# Patient Record
Sex: Female | Born: 1945 | Race: White | Hispanic: No | Marital: Married | Smoking: Never smoker
Health system: Southern US, Community
[De-identification: ages and names within clinical notes are randomized; demographics above are authoritative.]

## PROBLEM LIST (undated history)

## (undated) DIAGNOSIS — M1990 Unspecified osteoarthritis, unspecified site: Secondary | ICD-10-CM

## (undated) DIAGNOSIS — M419 Scoliosis, unspecified: Secondary | ICD-10-CM

## (undated) DIAGNOSIS — Z923 Personal history of irradiation: Secondary | ICD-10-CM

## (undated) DIAGNOSIS — J449 Chronic obstructive pulmonary disease, unspecified: Secondary | ICD-10-CM

## (undated) DIAGNOSIS — I10 Essential (primary) hypertension: Secondary | ICD-10-CM

## (undated) DIAGNOSIS — R0600 Dyspnea, unspecified: Secondary | ICD-10-CM

## (undated) DIAGNOSIS — J40 Bronchitis, not specified as acute or chronic: Secondary | ICD-10-CM

## (undated) DIAGNOSIS — K2970 Gastritis, unspecified, without bleeding: Secondary | ICD-10-CM

## (undated) DIAGNOSIS — F32A Depression, unspecified: Secondary | ICD-10-CM

## (undated) DIAGNOSIS — F329 Major depressive disorder, single episode, unspecified: Secondary | ICD-10-CM

## (undated) DIAGNOSIS — R112 Nausea with vomiting, unspecified: Secondary | ICD-10-CM

## (undated) DIAGNOSIS — IMO0002 Reserved for concepts with insufficient information to code with codable children: Secondary | ICD-10-CM

## (undated) DIAGNOSIS — F419 Anxiety disorder, unspecified: Secondary | ICD-10-CM

## (undated) DIAGNOSIS — J45909 Unspecified asthma, uncomplicated: Secondary | ICD-10-CM

## (undated) DIAGNOSIS — K219 Gastro-esophageal reflux disease without esophagitis: Secondary | ICD-10-CM

## (undated) DIAGNOSIS — Z9889 Other specified postprocedural states: Secondary | ICD-10-CM

## (undated) HISTORY — DX: Gastro-esophageal reflux disease without esophagitis: K21.9

## (undated) HISTORY — DX: Scoliosis, unspecified: M41.9

## (undated) HISTORY — DX: Chronic obstructive pulmonary disease, unspecified: J44.9

## (undated) HISTORY — DX: Unspecified osteoarthritis, unspecified site: M19.90

## (undated) HISTORY — DX: Bronchitis, not specified as acute or chronic: J40

## (undated) HISTORY — DX: Major depressive disorder, single episode, unspecified: F32.9

## (undated) HISTORY — DX: Depression, unspecified: F32.A

## (undated) HISTORY — DX: Gastritis, unspecified, without bleeding: K29.70

## (undated) HISTORY — DX: Unspecified asthma, uncomplicated: J45.909

## (undated) HISTORY — DX: Essential (primary) hypertension: I10

## (undated) HISTORY — PX: LAPAROSCOPIC REMOVAL OF MESENTERIC MASS: SHX5917

## (undated) HISTORY — DX: Reserved for concepts with insufficient information to code with codable children: IMO0002

## (undated) HISTORY — PX: ABDOMINAL HYSTERECTOMY: SHX81

---

## 1969-01-19 HISTORY — PX: DILATION AND CURETTAGE OF UTERUS: SHX78

## 1976-01-20 HISTORY — PX: TUBAL LIGATION: SHX77

## 1979-01-20 HISTORY — PX: PARTIAL HYSTERECTOMY: SHX80

## 1994-01-19 HISTORY — PX: CHOLECYSTECTOMY: SHX55

## 1996-01-20 HISTORY — PX: LUMBAR DISC SURGERY: SHX700

## 2004-02-05 ENCOUNTER — Ambulatory Visit: Payer: Self-pay | Admitting: Internal Medicine

## 2005-05-21 ENCOUNTER — Ambulatory Visit: Payer: Self-pay | Admitting: Internal Medicine

## 2005-05-27 ENCOUNTER — Ambulatory Visit: Payer: Self-pay | Admitting: Internal Medicine

## 2005-07-20 ENCOUNTER — Ambulatory Visit: Payer: Self-pay | Admitting: Unknown Physician Specialty

## 2005-08-17 ENCOUNTER — Ambulatory Visit: Payer: Self-pay | Admitting: Cardiology

## 2005-10-02 ENCOUNTER — Ambulatory Visit: Payer: Self-pay | Admitting: Unknown Physician Specialty

## 2006-07-14 ENCOUNTER — Ambulatory Visit: Payer: Self-pay | Admitting: Internal Medicine

## 2007-05-15 ENCOUNTER — Ambulatory Visit: Payer: Self-pay | Admitting: Family Medicine

## 2007-06-01 ENCOUNTER — Ambulatory Visit: Payer: Self-pay | Admitting: Internal Medicine

## 2007-07-27 ENCOUNTER — Ambulatory Visit: Payer: Self-pay | Admitting: Internal Medicine

## 2007-08-17 ENCOUNTER — Ambulatory Visit: Payer: Self-pay | Admitting: Unknown Physician Specialty

## 2008-07-31 ENCOUNTER — Ambulatory Visit: Payer: Self-pay | Admitting: Internal Medicine

## 2009-08-05 ENCOUNTER — Ambulatory Visit: Payer: Self-pay | Admitting: Internal Medicine

## 2010-02-10 ENCOUNTER — Ambulatory Visit: Payer: Self-pay | Admitting: Unknown Physician Specialty

## 2010-02-12 LAB — PATHOLOGY REPORT

## 2011-01-28 DIAGNOSIS — M4310 Spondylolisthesis, site unspecified: Secondary | ICD-10-CM | POA: Insufficient documentation

## 2011-01-28 DIAGNOSIS — M51379 Other intervertebral disc degeneration, lumbosacral region without mention of lumbar back pain or lower extremity pain: Secondary | ICD-10-CM | POA: Insufficient documentation

## 2011-01-28 DIAGNOSIS — M4120 Other idiopathic scoliosis, site unspecified: Secondary | ICD-10-CM | POA: Insufficient documentation

## 2011-02-04 ENCOUNTER — Ambulatory Visit: Payer: Self-pay | Admitting: Internal Medicine

## 2011-03-22 ENCOUNTER — Emergency Department: Payer: Self-pay | Admitting: Emergency Medicine

## 2011-05-11 DIAGNOSIS — J984 Other disorders of lung: Secondary | ICD-10-CM | POA: Insufficient documentation

## 2011-10-28 ENCOUNTER — Other Ambulatory Visit: Payer: Self-pay | Admitting: Internal Medicine

## 2011-10-28 MED ORDER — CLONAZEPAM 0.5 MG PO TABS: ORAL_TABLET | ORAL | Status: DC

## 2011-10-28 NOTE — Telephone Encounter (Signed)
Refill request for clonazepam 0.5 mg patient takes 1 1/2 to 1 twice a day she uses [facility] in [place].

## 2011-10-28 NOTE — Telephone Encounter (Signed)
Ok x 3  

## 2011-10-28 NOTE — Telephone Encounter (Signed)
Rx called to [facility]. 

## 2011-12-09 ENCOUNTER — Telehealth: Payer: Self-pay | Admitting: Internal Medicine

## 2011-12-09 NOTE — Telephone Encounter (Signed)
I am unable to see her today (since not here this pm).  I can see her tomorrow at 12:00 if she thinks she can wait until then.  If unable to wait,then acute care for eval.  Thanks.

## 2011-12-09 NOTE — Telephone Encounter (Signed)
Pt called pt wanted to know if she could see you today.  She thinks she has thrush in mouth and she is congested,left side of neck swollen. Headache

## 2011-12-09 NOTE — Telephone Encounter (Signed)
Pt aware of appointment 11/21 @ 12

## 2011-12-10 ENCOUNTER — Encounter: Payer: Self-pay | Admitting: Internal Medicine

## 2011-12-10 ENCOUNTER — Ambulatory Visit (INDEPENDENT_AMBULATORY_CARE_PROVIDER_SITE_OTHER): Payer: BC Managed Care – PPO | Admitting: Internal Medicine

## 2011-12-10 VITALS — BP 116/70 | HR 86 | Temp 98.2°F | Ht 61.0 in | Wt 173.5 lb

## 2011-12-10 DIAGNOSIS — J45909 Unspecified asthma, uncomplicated: Secondary | ICD-10-CM

## 2011-12-10 MED ORDER — FIRST-DUKES MOUTHWASH MT SUSP: 10.0000 mL | Freq: Three times a day (TID) | OROMUCOSAL | Status: DC

## 2011-12-10 MED ORDER — FLUTICASONE PROPIONATE 50 MCG/ACT NA SUSP: 2.0000 | Freq: Every day | NASAL | Status: DC

## 2011-12-10 MED ORDER — CEFDINIR 300 MG PO CAPS: 300.0000 mg | ORAL_CAPSULE | Freq: Two times a day (BID) | ORAL | Status: DC

## 2011-12-10 NOTE — Patient Instructions (Addendum)
It was nice seeing you today.  I am sorry you have not been feeling well.  I want you to take an antibiotic (omnicef) - twice a day.  Also saline nasal flushes - 2-3x/day.  Flonase nasal spray - in the evening.  Mucinex in the am and robitussin in the evening.  Let me know if any problems.

## 2011-12-13 ENCOUNTER — Encounter: Payer: Self-pay | Admitting: Internal Medicine

## 2011-12-13 DIAGNOSIS — J45909 Unspecified asthma, uncomplicated: Secondary | ICD-10-CM | POA: Insufficient documentation

## 2011-12-13 NOTE — Assessment & Plan Note (Signed)
No increased sob or wheezing.  Continue her current med regimen.  Treat the infection.  Follow.

## 2011-12-13 NOTE — Progress Notes (Signed)
  Subjective:    [name], female    DOB: 1945-04-04, 66 y.o.   MRN: [id number]  HPI 66 year old female with past history of anxiety/depression and late onset asthma/COPD who comes in today with concerns regarding sinus congestion.  She reports increased sinus pressure.  Describes a headache which she feels is similar to her sinus headaches.  Left neck is a little sore.  She has also had some trouble with her tongue burning.  Was swollen on the left side, but is better now.  Had white patches over her tongue.  Better now.  No throat swelling or other tongue swelling.  No swallowing issues.  No nausea or vomiting.  Eating and drinking ok.  No sob.  No chest tightness.  She did have a nasal fracture 6/13.  Went to acute care.  Did not see ENT.   Past Medical History  Diagnosis Date  . Scoliosis   . Depression   . Hypertension   . Asthma   . COPD (chronic obstructive pulmonary disease)   . Ulcer   . Bronchitis   . Gastritis     Review of Systems Patient denies any lightheadedness or dizziness.  Does report a "sinus headache".  Increased sinus pressure and symptoms as outlined. No chest pain, tightness or palpitations.  No increased shortness of breath, cough or congestion. No increased wheezing.   No nausea or vomiting.  No abdominal pain or cramping.  No bowel change, such as diarrhea, constipation, BRBPR or melana.  No urine change.        Objective:   Physical Exam Filed Vitals:   12/10/11 1212  BP: 116/70  Pulse: 86  Temp: 98.2 F (50.19 C)   66 year old female in no acute distress.   HEENT:  Nares - clear.  OP- with minimal erythema left lateral tongue.  No white patches.  No swelling.  Posterior oropharynx - without lesions or erythema.   NECK:  Supple.  Minimal tenderness to palpation over the left lateral neck.     HEART:  Appears to be regular. LUNGS:  Without crackles or wheezing audible.  Respirations even and unlabored.                     Assessment & Plan:   PROBABLE SINUSITIS.  Treat with Omnicef 300mg  bid x 10 days.  Saline flushes and flonase as directed.  Robitussin as directed.  Follow.    POSSIBLE THRUSH.  Will give her Dukes magic mouthwash to swish and spit tid.  Notify me or be reevaluated if persistent problems.

## 2012-01-18 ENCOUNTER — Other Ambulatory Visit: Payer: Self-pay | Admitting: Internal Medicine

## 2012-01-18 ENCOUNTER — Other Ambulatory Visit: Payer: Self-pay | Admitting: *Deleted

## 2012-01-18 MED ORDER — TIOTROPIUM BROMIDE MONOHYDRATE 18 MCG IN CAPS: 18.0000 ug | ORAL_CAPSULE | Freq: Every day | RESPIRATORY_TRACT | Status: DC

## 2012-01-18 NOTE — Telephone Encounter (Signed)
Sent in to pharmacy.  

## 2012-01-18 NOTE — Telephone Encounter (Signed)
Spiriva Handihaler    Inhale contents one capsule daily  30 cap

## 2012-01-29 ENCOUNTER — Other Ambulatory Visit: Payer: Self-pay | Admitting: Internal Medicine

## 2012-01-29 MED ORDER — HYDROCHLOROTHIAZIDE 25 MG PO TABS: 25.0000 mg | ORAL_TABLET | Freq: Every day | ORAL | Status: DC

## 2012-01-29 NOTE — Telephone Encounter (Signed)
[facility]  hydrochlorothiazide (HYDRODIURIL) 25 MG tablet # 30

## 2012-01-29 NOTE — Telephone Encounter (Signed)
Sent in to pharmacy.  

## 2012-02-15 ENCOUNTER — Ambulatory Visit: Payer: Self-pay | Admitting: Internal Medicine

## 2012-03-01 ENCOUNTER — Other Ambulatory Visit: Payer: Self-pay | Admitting: *Deleted

## 2012-03-01 MED ORDER — CITALOPRAM HYDROBROMIDE 40 MG PO TABS: 40.0000 mg | ORAL_TABLET | Freq: Every day | ORAL | Status: DC

## 2012-03-07 ENCOUNTER — Telehealth: Payer: Self-pay | Admitting: Internal Medicine

## 2012-03-07 ENCOUNTER — Ambulatory Visit: Payer: BC Managed Care – PPO | Admitting: Internal Medicine

## 2012-03-07 NOTE — Telephone Encounter (Signed)
Pt says she is just not feeling well. She has no energy and is trying to eat but she just does not feel good. She has not been to the restroom since last Sunday night and is not sure what to do. I offered her the 10:15 am to day but she lives so far away she didn't think she could make that and your next appt isn't until this Thursday at 8 am. I offered for her to see Raquel but she insists on seeing just you.

## 2012-03-07 NOTE — Telephone Encounter (Signed)
Patient spoke to Strang.

## 2012-03-07 NOTE — Telephone Encounter (Signed)
If no bowel movement since 02/29/12 - needs eval.  I would rec acute care.  Can xray if needed.  Make sure not obstruction.

## 2012-03-07 NOTE — Telephone Encounter (Signed)
Patient Information:  Caller Name: Baileigh  Phone: [phone]  Patient: Jennifer [name]  Gender: Female  DOB: 12/25/1945  Age: 67 Years  PCP: Dale [place]  Office Follow Up:  Does the office need to follow up with this patient?: Yes  Instructions For The Office: Please call back regarding work in appointment for today 03/07/12.  RN Note:  Last BM 02/29/12.  Mild, intermittent supraumbilical abdominal discomfort, mainly after eating. Denies nausea. Moderate fatigue follows 24 hours of moderate-severe vomiting and diarrhea; needs to rest after going to store. Drinking Ginger-Ale, Gatorade, Coffee.  Voiding per normal. Still feels fatigued 1 week after symptoms ended.  No appointments remain in office for 03/07/12.   Symptoms  Reason For Call & Symptoms: Fatigue, "feels worn out,"  abdominal bloating and intermittent stomach pain. Symptoms began episodes ofvomiting and diarrhea  Reviewed Health History In EMR: Yes  Reviewed Medications In EMR: Yes  Reviewed Allergies In EMR: Yes  Reviewed Surgeries / Procedures: Yes  Date of Onset of Symptoms: 02/28/2012  Treatments Tried: probiotic in orange juice  Treatments Tried Worked: No  Guideline(s) Used:  Weakness (Generalized) and Fatigue  Disposition Per Guideline:   Go to Office Now  Reason For Disposition Reached:   Moderate weakness (i.e., interferes with work, school, normal activities) and cause unknown  Advice Given:  Reassurance  Weakness often accompanies viral illnesses (e.g., colds and flu).  The weakness is usually worse the first 3 days of the illness, then gets better.  Here is some care advice that should help.  Reassurance  Not drinking enough fluids and being a little dehydrated is a common cause of mild weakness.  Fluids  : Drink several glasses of fruit juice, other clear fluids, or water. This will improve hydration and blood glucose.  Call Back If:   Passes out (faints)  You become worse.

## 2012-03-07 NOTE — Telephone Encounter (Signed)
Jennifer [name] spoke to patient.

## 2012-03-07 NOTE — Telephone Encounter (Signed)
Called patient to advise her to seek acute care. Left message for patient to return call.

## 2012-03-07 NOTE — Telephone Encounter (Signed)
Patient notified as instructed by telephone. Patient states that she will go to an urgent care now to seek care.

## 2012-03-07 NOTE — Telephone Encounter (Signed)
See previous note.  Needs eval today.

## 2012-03-14 ENCOUNTER — Telehealth: Payer: Self-pay | Admitting: Internal Medicine

## 2012-03-14 ENCOUNTER — Ambulatory Visit (INDEPENDENT_AMBULATORY_CARE_PROVIDER_SITE_OTHER): Payer: Medicare Other | Admitting: Internal Medicine

## 2012-03-14 ENCOUNTER — Encounter: Payer: Self-pay | Admitting: Internal Medicine

## 2012-03-14 VITALS — BP 116/80 | HR 82 | Temp 98.1°F | Ht 61.0 in | Wt 172.5 lb

## 2012-03-14 DIAGNOSIS — R0602 Shortness of breath: Secondary | ICD-10-CM

## 2012-03-14 MED ORDER — PREDNISONE 10 MG PO TABS: ORAL_TABLET | ORAL | Status: DC

## 2012-03-14 MED ORDER — CEFDINIR 300 MG PO CAPS: 300.0000 mg | ORAL_CAPSULE | Freq: Two times a day (BID) | ORAL | Status: DC

## 2012-03-14 NOTE — Telephone Encounter (Signed)
See if she can come at 11:30 for work in for

## 2012-03-14 NOTE — Telephone Encounter (Signed)
Patient Information:  Caller Name: Omah  Phone: [phone]  Patient: Jennifer [name]  Gender: Female  DOB: March 24, 1945  Age: 67 Years  PCP: Dale [place]  Office Follow Up:  Does the office need to follow up with this patient?: Yes  Instructions For The Office: Patient needs am appt. please.  She wants to see Dr. Lorin Picket only.   Symptoms  Reason For Call & Symptoms: Has a sore throat and deep cough.  Asking for an appt.  States that she has COPD and was told that anytime these sx start to call for an appt.  Her cough is productive/greenish, brown sputum.  Had a fever last night, none this am.  Reviewed Health History In EMR: Yes  Reviewed Medications In EMR: Yes  Reviewed Allergies In EMR: Yes  Reviewed Surgeries / Procedures: Yes  Date of Onset of Symptoms: 03/13/2012  Guideline(s) Used:  Cough  Disposition Per Guideline:   See Today in Office  Reason For Disposition Reached:   Known COPD or other severe lung disease (i.e., bronchiectasis, cystic fibrosis, lung surgery) and worsening symptoms (i.e., increased sputum purulence or amount, increased breathing difficulty)  Advice Given:  N/A

## 2012-03-14 NOTE — Telephone Encounter (Signed)
scheduled

## 2012-03-15 MED ORDER — ALBUTEROL SULFATE (2.5 MG/3ML) 0.083% IN NEBU: 2.5000 mg | INHALATION_SOLUTION | Freq: Once | RESPIRATORY_TRACT | Status: DC

## 2012-03-17 ENCOUNTER — Encounter: Payer: Self-pay | Admitting: Internal Medicine

## 2012-03-17 ENCOUNTER — Telehealth: Payer: Self-pay | Admitting: Internal Medicine

## 2012-03-17 NOTE — Assessment & Plan Note (Signed)
Treat the infection as outlined.  Prednisone taper as outlined.  Continue spiriva and use the rescue inhaler.  Follow.

## 2012-03-17 NOTE — Telephone Encounter (Signed)
Routing call a nurse notes

## 2012-03-17 NOTE — Progress Notes (Signed)
Subjective:    [name], female    DOB: 18-Aug-1945, 67 y.o.   MRN: [id number]  HPI 67 year old female with past history of anxiety/depression and late onset asthma/COPD who comes in today as a work in with concerns regarding increased cough, congestion and wheezing.  She reports that symptoms started over the last two days.  No sinus pressure.  Increased cough and sore throat.  Some wheezing.  Minimal aching.  Subjective fever.  Cough is productive of mucus at times.  Has taken some tussin.  Using her spiriva.  No nausea or vomiting currently.  Bowels moving.  No abdominal pain or problems.   Past Medical History  Diagnosis Date  . Scoliosis   . Depression   . Hypertension   . Asthma   . COPD (chronic obstructive pulmonary disease)   . Ulcer   . Bronchitis   . Gastritis   . Arthritis   . Chronic headaches   . GERD (gastroesophageal reflux disease)     Current Outpatient Prescriptions on File Prior to Visit  Medication Sig Dispense Refill  . citalopram (CELEXA) 40 MG tablet Take 1 tablet (40 mg total) by mouth daily.  30 tablet  4  . clonazePAM (KLONOPIN) 0.5 MG tablet Take 1 to 1 1/2 tablets by mouth twice daily  45 tablet  3  . fluticasone (FLONASE) 50 MCG/ACT nasal spray Place 2 sprays into the nose daily.  16 g  0  . hydrochlorothiazide (HYDRODIURIL) 25 MG tablet Take 1 tablet (25 mg total) by mouth daily.  30 tablet  5  . levalbuterol (XOPENEX HFA) 45 MCG/ACT inhaler Inhale 1-2 puffs into the lungs every 6 (six) hours as needed.      Marland Kitchen omeprazole (PRILOSEC) 20 MG capsule Take 20 mg by mouth daily.       Marland Kitchen tiotropium (SPIRIVA) 18 MCG inhalation capsule Place 1 capsule (18 mcg total) into inhaler and inhale daily.  30 capsule  5  . Diphenhyd-Hydrocort-Nystatin (FIRST-DUKES MOUTHWASH) SUSP Use as directed 10 mLs in the mouth or throat 3 (three) times daily. Swish and spit tid  237 mL  0   No current facility-administered medications on file prior to visit.    Review  of Systems Patient denies any lightheadedness or dizziness.  Does report the increased cough and congestion.  No sinus pressure.  Sore throat.  Some wheezing.  Subjective fever.  Cough productive.   No nausea or vomiting.  No abdominal pain or cramping.  No bowel change, such as diarrhea or constipation.        Objective:   Physical Exam  Filed Vitals:   03/14/12 1135  BP: 116/80  Pulse: 82  Temp: 98.1 F (74.15 C)   67 year old female in no acute distress.   HEENT:  Nares - clear.  OP- without lesions.  Posterior oropharynx - without lesions or erythema.   NECK:  Supple.  Non tender.       HEART:  Appears to be regular. LUNGS:  Without crackles.  Increased cough with expiration especially forced expiration.  Respirations even and unlabored.  Some increased wheezing.                     Assessment & Plan:  PROBABLE URI.  Has a history of asthma.  With increased cough and wheezing.  Albuterol neb given here in the office.  Pulse ox increased from 90% to 96%.  Continue spiriva.  Use the xopenex inhaler  as directed.  Unable to tolerate steroid inhalers.  Will treat with omnicef 300mg  x 10 days.  prednisone taper starting at 40mg  and decreasing by 5mg  each day until off.  Use the saline nasal spray and Flonase as directed.  Follow.  Notify me if symptoms worsen or do not resolve.

## 2012-03-17 NOTE — Telephone Encounter (Signed)
Patient Information:  Caller Name: Emiley  Phone: [phone]  Patient: Jennifer [name]  Gender: Female  DOB: 04-24-1945  Age: 67 Years  PCP: Dale [place]  Office Follow Up:  Does the office need to follow up with this patient?: No  Instructions For The Office: N/A  RN Note:  Reviewed with patient the side effects of Cefdinir.  Symptoms  Reason For Call & Symptoms: Pt  reports she was seen in the office Monday 03/14/12 and Rx Cefdinir now having diarrhea, abd. cramping.  Reviewed Health History In EMR: Yes  Reviewed Medications In EMR: Yes  Reviewed Allergies In EMR: Yes  Reviewed Surgeries / Procedures: Yes  Date of Onset of Symptoms: 03/14/2012  Treatments Tried: Cefdinir, prednisone taper  Treatments Tried Worked: No  Guideline(s) Used:  Diarrhea  Disposition Per Guideline:   Home Care  Reason For Disposition Reached:   Mild diarrhea  Advice Given:  Reassurance:  In healthy adults, new-onset diarrhea is usually caused by a viral infection of the intestines, which you can treat at home. Diarrhea is the body's way of getting rid of the infection. Here are some tips on how to keep ahead of the fluid losses.  Here is some care advice that should help.  Call Back If:  Signs of dehydration occur (e.g., no urine for more than 12 hours, very dry mouth, lightheaded, etc.)  Diarrhea lasts over 7 days  You become worse.

## 2012-04-21 ENCOUNTER — Encounter: Payer: Self-pay | Admitting: *Deleted

## 2012-05-11 ENCOUNTER — Ambulatory Visit (INDEPENDENT_AMBULATORY_CARE_PROVIDER_SITE_OTHER): Payer: BC Managed Care – PPO | Admitting: Internal Medicine

## 2012-05-11 ENCOUNTER — Encounter: Payer: Self-pay | Admitting: Internal Medicine

## 2012-05-11 VITALS — BP 110/80 | HR 78 | Temp 98.3°F | Ht 61.25 in | Wt 168.5 lb

## 2012-05-11 DIAGNOSIS — K2970 Gastritis, unspecified, without bleeding: Secondary | ICD-10-CM

## 2012-05-11 DIAGNOSIS — I10 Essential (primary) hypertension: Secondary | ICD-10-CM

## 2012-05-11 DIAGNOSIS — Z1239 Encounter for other screening for malignant neoplasm of breast: Secondary | ICD-10-CM

## 2012-05-11 DIAGNOSIS — K219 Gastro-esophageal reflux disease without esophagitis: Secondary | ICD-10-CM

## 2012-05-11 DIAGNOSIS — Z860100 Personal history of colon polyps, unspecified: Secondary | ICD-10-CM

## 2012-05-11 DIAGNOSIS — E7800 Pure hypercholesterolemia, unspecified: Secondary | ICD-10-CM

## 2012-05-11 DIAGNOSIS — R109 Unspecified abdominal pain: Secondary | ICD-10-CM

## 2012-05-11 DIAGNOSIS — K2990 Gastroduodenitis, unspecified, without bleeding: Secondary | ICD-10-CM

## 2012-05-11 DIAGNOSIS — J45909 Unspecified asthma, uncomplicated: Secondary | ICD-10-CM

## 2012-05-11 LAB — CBC WITH DIFFERENTIAL/PLATELET
Basophils Relative: 1.2 % (ref 0.0–3.0)
Eosinophils Absolute: 0.5 10*3/uL (ref 0.0–0.7)
Eosinophils Relative: 6 % — ABNORMAL HIGH (ref 0.0–5.0)
HCT: 42.5 % (ref 36.0–46.0)
Hemoglobin: 14.5 g/dL (ref 12.0–15.0)
Lymphs Abs: 2 10*3/uL (ref 0.7–4.0)
MCHC: 34 g/dL (ref 30.0–36.0)
MCV: 92.1 fl (ref 78.0–100.0)
Monocytes Absolute: 0.5 10*3/uL (ref 0.1–1.0)
Neutro Abs: 4.7 10*3/uL (ref 1.4–7.7)
Neutrophils Relative %: 60.3 % (ref 43.0–77.0)
RBC: 4.62 Mil/uL (ref 3.87–5.11)

## 2012-05-11 LAB — HEPATIC FUNCTION PANEL
AST: 24 U/L (ref 0–37)
Albumin: 4.2 g/dL (ref 3.5–5.2)
Alkaline Phosphatase: 63 U/L (ref 39–117)
Bilirubin, Direct: 0.1 mg/dL (ref 0.0–0.3)
Total Protein: 6.8 g/dL (ref 6.0–8.3)

## 2012-05-11 LAB — LIPID PANEL: Cholesterol: 203 mg/dL — ABNORMAL HIGH (ref 0–200)

## 2012-05-11 LAB — LIPASE: Lipase: 20 U/L (ref 11.0–59.0)

## 2012-05-11 LAB — BASIC METABOLIC PANEL
BUN: 9 mg/dL (ref 6–23)
Calcium: 9.4 mg/dL (ref 8.4–10.5)
Creatinine, Ser: 0.6 mg/dL (ref 0.4–1.2)
GFR: 98.89 mL/min (ref >60.00)
Glucose, Bld: 98 mg/dL (ref 70–99)

## 2012-05-11 LAB — AMYLASE: Amylase: 42 U/L (ref 27–131)

## 2012-05-12 ENCOUNTER — Encounter: Payer: Self-pay | Admitting: Internal Medicine

## 2012-05-12 DIAGNOSIS — K219 Gastro-esophageal reflux disease without esophagitis: Secondary | ICD-10-CM | POA: Insufficient documentation

## 2012-05-12 DIAGNOSIS — I10 Essential (primary) hypertension: Secondary | ICD-10-CM | POA: Insufficient documentation

## 2012-05-12 DIAGNOSIS — Z86010 Personal history of colonic polyps: Secondary | ICD-10-CM | POA: Insufficient documentation

## 2012-05-12 DIAGNOSIS — K2970 Gastritis, unspecified, without bleeding: Secondary | ICD-10-CM | POA: Insufficient documentation

## 2012-05-12 NOTE — Assessment & Plan Note (Signed)
Breathing stable.  Continue current med regimen.  Follow.

## 2012-05-12 NOTE — Assessment & Plan Note (Signed)
Colonoscopy 02/09/10 revealed a rectal polyp and internal hemorrhoids.  Currently asymptomatic.    

## 2012-05-12 NOTE — Assessment & Plan Note (Signed)
Blood pressure doing well.  Same med regimen.  Check metabolic panel.

## 2012-05-12 NOTE — Assessment & Plan Note (Signed)
On omeprazole.  Add zantac in the evening as outlined.  Follow.  Get him back in soon to reassess.

## 2012-05-12 NOTE — Assessment & Plan Note (Signed)
Continue omeprazole as outlined.  Add zantac.  Follow.  Pursue labs and abdominal US first.  Further w/up pending results.

## 2012-05-12 NOTE — Progress Notes (Signed)
Subjective:    [name], female    DOB: 1945-12-03, 67 y.o.   MRN: [id number]  HPI 67 year old female with past history of anxiety/depression and late onset asthma/COPD who comes in today to follow up on these issues as well as for a complete physical exam.  Breathing has been stable.  She has been having some stomach issues.  Describes intermittent epigastric discomfort.  Occurs intermittently and is associated with eating and drinking.  No certain foods trigger, but it is related to eating and drinking.  Appetite good.  No vomiting or dirrhea.  She has had previous episodes of vomiting and diarrhea.  (had 2-3 episodes).  Was evaluated and told she had a virus.  She has not had an episode of vomiting or dirrhea for the last couple of months.  Bowels stable now.  No blood.  No chest pain or tightness.     Past Medical History  Diagnosis Date  . Scoliosis   . Depression   . Hypertension   . Asthma   . COPD (chronic obstructive pulmonary disease)   . Ulcer   . Bronchitis   . Gastritis   . Arthritis   . Chronic headaches   . GERD (gastroesophageal reflux disease)     Current Outpatient Prescriptions on File Prior to Visit  Medication Sig Dispense Refill  . citalopram (CELEXA) 40 MG tablet Take 1 tablet (40 mg total) by mouth daily.  30 tablet  4  . clonazePAM (KLONOPIN) 0.5 MG tablet Take 1 to 1 1/2 tablets by mouth twice daily  45 tablet  3  . fluticasone (FLONASE) 50 MCG/ACT nasal spray Place 2 sprays into the nose daily.  16 g  0  . hydrochlorothiazide (HYDRODIURIL) 25 MG tablet Take 1 tablet (25 mg total) by mouth daily.  30 tablet  5  . levalbuterol (XOPENEX HFA) 45 MCG/ACT inhaler Inhale 1-2 puffs into the lungs every 6 (six) hours as needed.      Marland Kitchen omeprazole (PRILOSEC) 20 MG capsule Take 20 mg by mouth daily.       Marland Kitchen tiotropium (SPIRIVA) 18 MCG inhalation capsule Place 1 capsule (18 mcg total) into inhaler and inhale daily.  30 capsule  5   Current  Facility-Administered Medications on File Prior to Visit  Medication Dose Route Frequency Provider Last Rate Last Dose  . albuterol (PROVENTIL) (2.5 MG/3ML) 0.083% nebulizer solution 2.5 mg  2.5 mg Nebulization Once Charm Barges, MD        Review of Systems Patient denies any lightheadedness or dizziness.  No sinus or allergy symptoms.   No chest pain, tightness or palpitations.  No increased shortness of breath, cough or congestion. Breathing stable. No nausea or vomiting currently.   Has not had any vomiting or diarrhea for the last couple of months.  She does report the epigastric discomfort as outlined.  No reflux.  Takes omeprazole.   No bowel change, such as diarrhea currently.  No constipation, BRBPR or melana.  No urine change.        Objective:   Physical Exam  Filed Vitals:   05/11/12 1025  BP: 110/80  Pulse: 78  Temp: 98.3 F (44.67 C)   67 year old female in no acute distress.   HEENT:  Nares- clear.  Oropharynx - without lesions. NECK:  Supple.  Nontender.  No audible bruit.  HEART:  Appears to be regular. LUNGS:  No crackles or wheezing audible.  Respirations even and unlabored.  RADIAL PULSE:  Equal bilaterally.    BREASTS:  No nipple discharge or nipple retraction present.  Could not appreciate any distinct nodules or axillary adenopathy.  ABDOMEN:  Soft.  No significant tenderness.  Minimal epigastric tenderness.  No rebound or guarding.  Bowel sounds present and normal.  No audible abdominal bruit.  GU:  Normal external genitalia.  Vaginal vault without lesions.  S/p hysterectomy.  Could not appreciate any adnexal masses or tenderness.   RECTAL:  Heme negative.   EXTREMITIES:  No increased edema present.  DP pulses palpable and equal bilaterally.             Assessment & Plan:  EPIGASTRIC PAIN.  Eating appears to trigger, but no certain foods.  She is s/p cholecystectomy.  Will check liver panel, cbc and amylase/lipase.  Check abdominal ultrasound.  Continue  omeprazole q am and start ranitidine 150mg  before supper.  Get her back in soon to reassess.  If any symptom change or problem, she is to be reevaluated.    HEALTH MAINTENANCE.  Physical today.  She is s/p hysterectomy.  Colonoscopy 02/09/10 revealed rectal polyp and internal hemorrhoids.  Schedule mammogram.

## 2012-05-13 ENCOUNTER — Ambulatory Visit: Payer: Self-pay | Admitting: Internal Medicine

## 2012-05-16 ENCOUNTER — Telehealth: Payer: Self-pay | Admitting: Internal Medicine

## 2012-05-16 NOTE — Telephone Encounter (Signed)
Patient wanting ultra sound results from 4.25.14.

## 2012-05-16 NOTE — Telephone Encounter (Signed)
Pt informed of Abdomen Ultrasound result from 05/13/12

## 2012-05-30 ENCOUNTER — Ambulatory Visit: Payer: Self-pay | Admitting: Internal Medicine

## 2012-05-31 ENCOUNTER — Encounter: Payer: Self-pay | Admitting: Internal Medicine

## 2012-06-10 ENCOUNTER — Ambulatory Visit: Payer: Medicare Other | Admitting: Internal Medicine

## 2012-06-14 ENCOUNTER — Encounter: Payer: Self-pay | Admitting: Internal Medicine

## 2012-06-22 ENCOUNTER — Encounter: Payer: Self-pay | Admitting: Internal Medicine

## 2012-06-24 ENCOUNTER — Telehealth: Payer: Self-pay | Admitting: *Deleted

## 2012-06-24 ENCOUNTER — Encounter: Payer: Self-pay | Admitting: Internal Medicine

## 2012-06-24 ENCOUNTER — Ambulatory Visit (INDEPENDENT_AMBULATORY_CARE_PROVIDER_SITE_OTHER): Payer: Medicare Other | Admitting: Internal Medicine

## 2012-06-24 VITALS — BP 110/80 | HR 74 | Temp 98.6°F | Ht 61.25 in | Wt 169.8 lb

## 2012-06-24 DIAGNOSIS — I10 Essential (primary) hypertension: Secondary | ICD-10-CM

## 2012-06-24 DIAGNOSIS — Z860100 Personal history of colon polyps, unspecified: Secondary | ICD-10-CM

## 2012-06-24 DIAGNOSIS — K2970 Gastritis, unspecified, without bleeding: Secondary | ICD-10-CM

## 2012-06-24 DIAGNOSIS — E7800 Pure hypercholesterolemia, unspecified: Secondary | ICD-10-CM

## 2012-06-24 DIAGNOSIS — K219 Gastro-esophageal reflux disease without esophagitis: Secondary | ICD-10-CM

## 2012-06-24 MED ORDER — OMEPRAZOLE 20 MG PO CPDR: 20.0000 mg | DELAYED_RELEASE_CAPSULE | Freq: Two times a day (BID) | ORAL | Status: DC

## 2012-06-24 NOTE — Patient Instructions (Addendum)
Take one prilosec before breakfast and one prilosec before evening meal.  Take zantac before bed.

## 2012-06-24 NOTE — Telephone Encounter (Signed)
Pt states that she had a missed call from our office. Looks like Hospital doctor left her a detailed message on home phone. I informed pt of her referral appt on 7/8. Pt states that she can not go on that date. I gave pt the number to [facility] [phone]). I told her to ask for the GI dept & she could reschedule the appt herself. Pt states that she would call them now

## 2012-06-26 ENCOUNTER — Encounter: Payer: Self-pay | Admitting: Internal Medicine

## 2012-06-26 NOTE — Assessment & Plan Note (Signed)
Breathing stable.  Continue current med regimen.  Follow.   

## 2012-06-26 NOTE — Progress Notes (Signed)
Subjective:    [name], female    DOB: September 24, 1945, 67 y.o.   MRN: [id number]  HPI 67 year old female with past history of anxiety/depression and late onset asthma/COPD who comes in today for a scheduled follow up.   Breathing has been stable.  She was having some stomach issues.  Described intermittent epigastric discomfort.  See last note for details.  Now taking prilosec and zantac.  Symptoms improved.  Acid better.  Epigastric pain better.  Still with some break through symptoms at times.  Appetite good.  No vomiting or dirrhea.   No blood. Bowels better on probiotic.  No chest pain or tightness.     Past Medical History  Diagnosis Date  . Scoliosis   . Depression   . Hypertension   . Asthma   . COPD (chronic obstructive pulmonary disease)   . Ulcer   . Bronchitis   . Gastritis   . Arthritis   . Chronic headaches   . GERD (gastroesophageal reflux disease)     Current Outpatient Prescriptions on File Prior to Visit  Medication Sig Dispense Refill  . citalopram (CELEXA) 40 MG tablet Take 1 tablet (40 mg total) by mouth daily.  30 tablet  4  . clonazePAM (KLONOPIN) 0.5 MG tablet Take 1 to 1 1/2 tablets by mouth twice daily  45 tablet  3  . fluticasone (FLONASE) 50 MCG/ACT nasal spray Place 2 sprays into the nose daily.  16 g  0  . hydrochlorothiazide (HYDRODIURIL) 25 MG tablet Take 1 tablet (25 mg total) by mouth daily.  30 tablet  5  . levalbuterol (XOPENEX HFA) 45 MCG/ACT inhaler Inhale 1-2 puffs into the lungs every 6 (six) hours as needed.      . tiotropium (SPIRIVA) 18 MCG inhalation capsule Place 1 capsule (18 mcg total) into inhaler and inhale daily.  30 capsule  5   Current Facility-Administered Medications on File Prior to Visit  Medication Dose Route Frequency Provider Last Rate Last Dose  . albuterol (PROVENTIL) (2.5 MG/3ML) 0.083% nebulizer solution 2.5 mg  2.5 mg Nebulization Once Charm Barges, MD        Review of Systems Patient denies any  lightheadedness or dizziness.  No sinus or allergy symptoms.   No chest pain, tightness or palpitations.  No increased shortness of breath, cough or congestion. Breathing stable. No nausea or vomiting.   Epigastric discomfort improved.  Still some occasional reflux.  No bowel change.  Probiotic is helping.  No constipation, BRBPR or melana.  No urine change.  Taking prilosec and zantac regularly.       Objective:   Physical Exam  Filed Vitals:   06/24/12 1010  BP: 110/80  Pulse: 74  Temp: 98.6 F (46 C)   67 year old female in no acute distress.   HEENT:  Nares- clear.  Oropharynx - without lesions. NECK:  Supple.  Nontender.  No audible bruit.  HEART:  Appears to be regular. LUNGS:  No crackles or wheezing audible.  Respirations even and unlabored.  RADIAL PULSE:  Equal bilaterally.   ABDOMEN:  Soft.  No significant tenderness.  Minimal epigastric tenderness.  No rebound or guarding.  Bowel sounds present and normal.  No audible abdominal bruit.  EXTREMITIES:  No increased edema present.  DP pulses palpable and equal bilaterally.             Assessment & Plan:  EPIGASTRIC PAIN.  Improved.  On prilosec and zantac as outlined.  Recent abdominal ultrasound negative.  Still some breakthrough symptoms.  Will increased prilosec to bid and take zantac at night.  Given persistent symptoms, will refer to GI for evaluation and question of need for EGD.    HEALTH MAINTENANCE.  Physical last visit..  She is s/p hysterectomy.  Colonoscopy 02/09/10 revealed rectal polyp and internal hemorrhoids.  Mammogram 05/30/12 - Birads I.

## 2012-06-26 NOTE — Assessment & Plan Note (Signed)
Adjust omeprazole as outlined.  Zantac in the evening.  Refer to GI as outlined.

## 2012-06-26 NOTE — Assessment & Plan Note (Signed)
Colonoscopy 02/09/10 revealed a rectal polyp and internal hemorrhoids.  Currently asymptomatic.

## 2012-06-26 NOTE — Assessment & Plan Note (Signed)
Blood pressure doing well.  Same med regimen.  Follow metabolic panel.    

## 2012-06-26 NOTE — Assessment & Plan Note (Signed)
On omeprazole in the am and zantac in the evening.  Will increase prilosec to bid (before breakfast and before supper) and zantac in the evening.  Refer to GI for evaluation and question of need for EGD.

## 2012-07-18 ENCOUNTER — Encounter: Payer: Self-pay | Admitting: Internal Medicine

## 2012-07-18 ENCOUNTER — Ambulatory Visit (INDEPENDENT_AMBULATORY_CARE_PROVIDER_SITE_OTHER): Payer: Medicare Other | Admitting: Internal Medicine

## 2012-07-18 VITALS — BP 110/70 | HR 73 | Temp 98.4°F | Ht 61.25 in | Wt 163.5 lb

## 2012-07-18 DIAGNOSIS — R0602 Shortness of breath: Secondary | ICD-10-CM

## 2012-07-18 DIAGNOSIS — J029 Acute pharyngitis, unspecified: Secondary | ICD-10-CM

## 2012-07-18 MED ORDER — PREDNISONE 10 MG PO TABS: ORAL_TABLET | ORAL | Status: DC

## 2012-07-18 MED ORDER — ALBUTEROL SULFATE (2.5 MG/3ML) 0.083% IN NEBU
2.5000 mg | INHALATION_SOLUTION | Freq: Once | RESPIRATORY_TRACT | Status: AC
  Administered 2012-07-18: 2.5 mg via RESPIRATORY_TRACT

## 2012-07-18 MED ORDER — CEFDINIR 300 MG PO CAPS: 300.0000 mg | ORAL_CAPSULE | Freq: Two times a day (BID) | ORAL | Status: DC

## 2012-07-19 ENCOUNTER — Encounter: Payer: Self-pay | Admitting: Internal Medicine

## 2012-07-19 NOTE — Progress Notes (Signed)
Subjective:    [name], female    DOB: [birth year], 67 y.o.   MRN: [id number]  Cough  67 year old female with past history of anxiety/depression and late onset asthma/COPD who comes in today as a work in with concerns regarding increased cough, congestion and wheezing.  She reports that she started with a sore throat four days ago.  Developed increased cough two days ago.  No significant sinus pressure.  No head congestion.  Tmax 100.  Minimal nasal stuffiness.  Some increased chest congestion.  Cough productive of colored mucus.  Feels some chest tightness.  Some wheezing.  Used her rescue inhaler yesterday.  Using spiriva regularly.  Some decreased appetite.     Past Medical History  Diagnosis Date  . Scoliosis   . Depression   . Hypertension   . Asthma   . COPD (chronic obstructive pulmonary disease)   . Ulcer   . Bronchitis   . Gastritis   . Arthritis   . Chronic headaches   . GERD (gastroesophageal reflux disease)     Current Outpatient Prescriptions on File Prior to Visit  Medication Sig Dispense Refill  . citalopram (CELEXA) 40 MG tablet Take 1 tablet (40 mg total) by mouth daily.  30 tablet  4  . clonazePAM (KLONOPIN) 0.5 MG tablet Take 1 to 1 1/2 tablets by mouth twice daily  45 tablet  3  . fluticasone (FLONASE) 50 MCG/ACT nasal spray Place 2 sprays into the nose daily.  16 g  0  . hydrochlorothiazide (HYDRODIURIL) 25 MG tablet Take 1 tablet (25 mg total) by mouth daily.  30 tablet  5  . levalbuterol (XOPENEX HFA) 45 MCG/ACT inhaler Inhale 1-2 puffs into the lungs every 6 (six) hours as needed.      Marland Kitchen omeprazole (PRILOSEC) 20 MG capsule Take 1 capsule (20 mg total) by mouth 2 (two) times daily.  60 capsule  3  . Probiotic Product (PROBIOTIC DAILY PO) Take by mouth.      . ranitidine (ZANTAC) 150 MG tablet Take 150 mg by mouth at bedtime.      Marland Kitchen tiotropium (SPIRIVA) 18 MCG inhalation capsule Place 1 capsule (18 mcg total) into inhaler and inhale daily.  30  capsule  5   Current Facility-Administered Medications on File Prior to Visit  Medication Dose Route Frequency Provider Last Rate Last Dose  . albuterol (PROVENTIL) (2.5 MG/3ML) 0.083% nebulizer solution 2.5 mg  2.5 mg Nebulization Once Charm Barges, MD        Review of Systems  Respiratory: Positive for cough.   Patient denies any lightheadedness or dizziness.  Does report the increased cough and congestion.  No sinus pressure.  Sore throat.  Some wheezing.  Low grade fever as outlined.  Cough productive of colored mucus.  No nausea or vomiting.  No abdominal pain or cramping.  No bowel change, such as diarrhea or constipation.        Objective:   Physical Exam  Filed Vitals:   07/18/12 0956  BP: 110/70  Pulse: 73  Temp: 98.4 F (59.73 C)   67 year old female in no acute distress.   HEENT:  Nares - clear.  OP- without lesions.  Posterior oropharynx - minimal erythema - left post oropharynx.   NECK:  Supple.  Minimal tenderness to palpation over the left side - neck.        HEART:  Appears to be regular. LUNGS:  Without crackles.  Increased cough with  expiration especially forced expiration.  Respirations even and unlabored.  Some increased wheezing.                     Assessment & Plan:  PROBABLE URI/BRONCHITIS.   Has a history of asthma.  With increased cough and wheezing.  Albuterol neb given here in the office.  Pulse ox increased from 93% to 96%.  Continue spiriva.  Use the xopenex inhaler as directed.  Unable to tolerate steroid inhalers.  Will treat with omnicef 300mg  x 10 days.  Prednisone taper starting at 40mg  and decreasing by 5mg  each day until off.  Use the saline nasal spray and Flonase as directed.  Follow.  Notify me if symptoms worsen or do not resolve.

## 2012-07-19 NOTE — Assessment & Plan Note (Addendum)
Treat the infection and flare as outlined.  Follow.

## 2012-07-20 LAB — CULTURE, GROUP A STREP: Organism ID, Bacteria: NORMAL

## 2012-07-25 ENCOUNTER — Telehealth: Payer: Self-pay | Admitting: Internal Medicine

## 2012-07-25 NOTE — Telephone Encounter (Signed)
Spoke with pt, she has an appt with dentist today at 4 for x-rays.

## 2012-07-25 NOTE — Telephone Encounter (Signed)
Pt states she recently had a root canal and is having a lot of trouble with her sinuses again.  Pt is asking if she needs to see an ENT.  Pt did not wish to schedule an appt w/ Dr. Lorin Picket at this time and is going to wait to hear back from Dr. Lorin Picket.  Pt states she will also call her dentist who did the root canal to see if he has any other recommendations.

## 2012-08-05 ENCOUNTER — Other Ambulatory Visit: Payer: Self-pay | Admitting: *Deleted

## 2012-08-05 MED ORDER — CITALOPRAM HYDROBROMIDE 40 MG PO TABS: 40.0000 mg | ORAL_TABLET | Freq: Every day | ORAL | Status: DC

## 2012-08-08 ENCOUNTER — Other Ambulatory Visit: Payer: Self-pay | Admitting: Internal Medicine

## 2012-08-08 MED ORDER — CLONAZEPAM 0.5 MG PO TABS: ORAL_TABLET | ORAL | Status: DC

## 2012-08-08 NOTE — Telephone Encounter (Signed)
ok'd refill for clonazepam #45 with 2 refills.   

## 2012-08-08 NOTE — Telephone Encounter (Signed)
Okay to refill? 

## 2012-08-08 NOTE — Telephone Encounter (Signed)
[facility] Pt called checking on her rx of klonopin  Pt only has 1 pill left  Pt stated [facility] waiting on response from dr scott.

## 2012-08-31 ENCOUNTER — Other Ambulatory Visit: Payer: Self-pay | Admitting: *Deleted

## 2012-08-31 MED ORDER — TIOTROPIUM BROMIDE MONOHYDRATE 18 MCG IN CAPS: 18.0000 ug | ORAL_CAPSULE | Freq: Every day | RESPIRATORY_TRACT | Status: DC

## 2012-09-05 ENCOUNTER — Other Ambulatory Visit: Payer: Self-pay | Admitting: *Deleted

## 2012-09-06 MED ORDER — HYDROCHLOROTHIAZIDE 25 MG PO TABS: 25.0000 mg | ORAL_TABLET | Freq: Every day | ORAL | Status: DC

## 2012-09-21 ENCOUNTER — Other Ambulatory Visit: Payer: Medicare Other

## 2012-09-26 ENCOUNTER — Ambulatory Visit: Payer: Medicare Other | Admitting: Internal Medicine

## 2012-09-26 ENCOUNTER — Other Ambulatory Visit: Payer: Self-pay

## 2012-11-02 ENCOUNTER — Encounter: Payer: Self-pay | Admitting: *Deleted

## 2012-11-03 ENCOUNTER — Other Ambulatory Visit (INDEPENDENT_AMBULATORY_CARE_PROVIDER_SITE_OTHER): Payer: Medicare Other

## 2012-11-03 ENCOUNTER — Encounter (INDEPENDENT_AMBULATORY_CARE_PROVIDER_SITE_OTHER): Payer: Self-pay

## 2012-11-03 DIAGNOSIS — E7800 Pure hypercholesterolemia, unspecified: Secondary | ICD-10-CM

## 2012-11-03 DIAGNOSIS — I10 Essential (primary) hypertension: Secondary | ICD-10-CM

## 2012-11-03 LAB — BASIC METABOLIC PANEL
CO2: 33 mEq/L — ABNORMAL HIGH (ref 19–32)
Calcium: 9.3 mg/dL (ref 8.4–10.5)
Creatinine, Ser: 0.7 mg/dL (ref 0.4–1.2)
Glucose, Bld: 114 mg/dL — ABNORMAL HIGH (ref 70–99)

## 2012-11-03 LAB — LDL CHOLESTEROL, DIRECT: Direct LDL: 148.4 mg/dL

## 2012-11-04 ENCOUNTER — Encounter: Payer: Self-pay | Admitting: *Deleted

## 2012-11-07 ENCOUNTER — Encounter: Payer: Self-pay | Admitting: Internal Medicine

## 2012-11-07 ENCOUNTER — Ambulatory Visit (INDEPENDENT_AMBULATORY_CARE_PROVIDER_SITE_OTHER): Payer: Medicare Other | Admitting: Internal Medicine

## 2012-11-07 VITALS — BP 110/70 | HR 71 | Temp 97.7°F | Wt 166.2 lb

## 2012-11-07 DIAGNOSIS — K2970 Gastritis, unspecified, without bleeding: Secondary | ICD-10-CM

## 2012-11-07 DIAGNOSIS — R1013 Epigastric pain: Secondary | ICD-10-CM

## 2012-11-07 DIAGNOSIS — K219 Gastro-esophageal reflux disease without esophagitis: Secondary | ICD-10-CM

## 2012-11-07 DIAGNOSIS — J45909 Unspecified asthma, uncomplicated: Secondary | ICD-10-CM

## 2012-11-07 DIAGNOSIS — R7309 Other abnormal glucose: Secondary | ICD-10-CM

## 2012-11-07 DIAGNOSIS — R739 Hyperglycemia, unspecified: Secondary | ICD-10-CM

## 2012-11-07 DIAGNOSIS — I10 Essential (primary) hypertension: Secondary | ICD-10-CM

## 2012-11-07 DIAGNOSIS — E7800 Pure hypercholesterolemia, unspecified: Secondary | ICD-10-CM

## 2012-11-07 DIAGNOSIS — Z86010 Personal history of colonic polyps: Secondary | ICD-10-CM

## 2012-11-12 ENCOUNTER — Encounter: Payer: Self-pay | Admitting: Internal Medicine

## 2012-11-12 DIAGNOSIS — E7800 Pure hypercholesterolemia, unspecified: Secondary | ICD-10-CM | POA: Insufficient documentation

## 2012-11-12 DIAGNOSIS — R739 Hyperglycemia, unspecified: Secondary | ICD-10-CM | POA: Insufficient documentation

## 2012-11-12 DIAGNOSIS — R7309 Other abnormal glucose: Secondary | ICD-10-CM | POA: Insufficient documentation

## 2012-11-12 NOTE — Assessment & Plan Note (Signed)
Colonoscopy 02/09/10 revealed a rectal polyp and internal hemorrhoids.    

## 2012-11-12 NOTE — Assessment & Plan Note (Signed)
Blood pressure doing well.  Same med regimen.  Follow metabolic panel.    

## 2012-11-12 NOTE — Assessment & Plan Note (Signed)
On prilosec bid (before breakfast and before supper) and zantac in the evening.  Referred to GI for evaluation and question of need for EGD.

## 2012-11-12 NOTE — Assessment & Plan Note (Signed)
Breathing stable.  Continue current med regimen.  Follow.   With some sinus pressure and congestion as outlined.  Using flonase.  Better.  mucinex and robitussin as directed.  flonase and saline nasal spray as outlined.  Follow. Closely.  Let me know if any progression.

## 2012-11-12 NOTE — Progress Notes (Signed)
Subjective:    [name], female    DOB: December 24, 1945, 67 y.o.   MRN: [id number]  HPI 67 year old female with past history of anxiety/depression and late onset asthma/COPD who comes in today for a scheduled follow up.   Breathing has been stable.  She was having some stomach issues.  Described intermittent epigastric discomfort.  See previous notes for details.  Now taking prilosec and zantac.  Symptoms improved some, but still persistent.   Acid better.  Still with some break through symptoms at times.  Appetite good.  No vomiting or dirrhea.   No blood.  Bowels better on probiotic.  No chest pain or tightness.  She had noticed some increased nasal congestion.  Using flonase.  Last week had headache - pressure.  Sat out at a ballgame and it was windy.  She feels this aggravated her symptoms.  Some sinus pressure.  No chest congestion.  Sugars slightly increased.  Discussed diet adjustment.     Past Medical History  Diagnosis Date  . Scoliosis   . Depression   . Hypertension   . Asthma   . COPD (chronic obstructive pulmonary disease)   . Ulcer   . Bronchitis   . Gastritis   . Arthritis   . Chronic headaches   . GERD (gastroesophageal reflux disease)     Current Outpatient Prescriptions on File Prior to Visit  Medication Sig Dispense Refill  . citalopram (CELEXA) 40 MG tablet Take 1 tablet (40 mg total) by mouth daily.  30 tablet  2  . clonazePAM (KLONOPIN) 0.5 MG tablet Take 1 to 1 1/2 tablets by mouth twice daily  45 tablet  2  . hydrochlorothiazide (HYDRODIURIL) 25 MG tablet Take 1 tablet (25 mg total) by mouth daily.  30 tablet  5  . levalbuterol (XOPENEX HFA) 45 MCG/ACT inhaler Inhale 1-2 puffs into the lungs every 6 (six) hours as needed.      Marland Kitchen omeprazole (PRILOSEC) 20 MG capsule Take 1 capsule (20 mg total) by mouth 2 (two) times daily.  60 capsule  3  . Probiotic Product (PROBIOTIC DAILY PO) Take by mouth.      . ranitidine (ZANTAC) 150 MG tablet Take 150 mg by mouth  at bedtime.      Marland Kitchen tiotropium (SPIRIVA) 18 MCG inhalation capsule Place 1 capsule (18 mcg total) into inhaler and inhale daily.  30 capsule  5   Current Facility-Administered Medications on File Prior to Visit  Medication Dose Route Frequency Provider Last Rate Last Dose  . albuterol (PROVENTIL) (2.5 MG/3ML) 0.083% nebulizer solution 2.5 mg  2.5 mg Nebulization Once Charm Barges, MD        Review of Systems Patient denies any lightheadedness or dizziness.  Some sinus pressure and congestion as outlined.  No chest pain, tightness or palpitations.  No increased shortness of breath, cough or congestion.  Breathing stable. No nausea or vomiting.   Epigastric discomfort as outlined.  Still some occasional reflux and breakthrough symptoms.   No bowel change.  Probiotic is helping.  No constipation, BRBPR or melana.  No urine change.  Taking prilosec and zantac regularly.       Objective:   Physical Exam  Filed Vitals:   11/07/12 1435  BP: 110/70  Pulse: 71  Temp: 97.7 F (36.18 C)   67 year old female in no acute distress.   HEENT:  Nares- clear.  Oropharynx - without lesions. NECK:  Supple.  Nontender.  No audible  bruit.  HEART:  Appears to be regular. LUNGS:  No crackles or wheezing audible.  Respirations even and unlabored.  RADIAL PULSE:  Equal bilaterally.   ABDOMEN:  Soft.  No significant tenderness.  Minimal epigastric tenderness.  No rebound or guarding.  Bowel sounds present and normal.  No audible abdominal bruit.  EXTREMITIES:  No increased edema present.  DP pulses palpable and equal bilaterally.             Assessment & Plan:  EPIGASTRIC PAIN.  Improved.  On prilosec and zantac as outlined.  Recent abdominal ultrasound negative.  Still some breakthrough symptoms.  We increased prilosec to bid and she is taking zantac at night.  Given persistent symptoms, was referedr to GI for evaluation.     HEALTH MAINTENANCE.  Physical 05/11/12.   She is s/p hysterectomy.  Colonoscopy  02/09/10 revealed rectal polyp and internal hemorrhoids.  Mammogram 05/30/12 - Birads I.

## 2012-11-12 NOTE — Assessment & Plan Note (Signed)
Recent cholesterol check 11/03/12 revealed total cholesterol 227, triglycerides 120, HDL 60 and LDL 148.  Low cholesterol diet and exercise.  Follow.    

## 2012-11-12 NOTE — Assessment & Plan Note (Signed)
On omeprazole and zantac as outlined.  Referred to GI given persistent symptoms.

## 2012-11-12 NOTE — Assessment & Plan Note (Signed)
Sugar slightly increased.  Check fasting glucose and a1c.

## 2012-12-01 ENCOUNTER — Encounter: Payer: Self-pay | Admitting: Internal Medicine

## 2012-12-08 ENCOUNTER — Other Ambulatory Visit: Payer: Self-pay | Admitting: *Deleted

## 2012-12-08 MED ORDER — CITALOPRAM HYDROBROMIDE 40 MG PO TABS: 40.0000 mg | ORAL_TABLET | Freq: Every day | ORAL | Status: DC

## 2012-12-30 ENCOUNTER — Ambulatory Visit: Payer: Self-pay | Admitting: Unknown Physician Specialty

## 2013-01-09 ENCOUNTER — Telehealth: Payer: Self-pay | Admitting: Internal Medicine

## 2013-01-09 ENCOUNTER — Encounter: Payer: Self-pay | Admitting: Internal Medicine

## 2013-01-09 ENCOUNTER — Ambulatory Visit: Payer: BC Managed Care – PPO | Admitting: Internal Medicine

## 2013-01-09 ENCOUNTER — Ambulatory Visit (INDEPENDENT_AMBULATORY_CARE_PROVIDER_SITE_OTHER): Payer: Medicare Other | Admitting: Internal Medicine

## 2013-01-09 VITALS — BP 110/70 | HR 77 | Temp 98.0°F | Wt 161.0 lb

## 2013-01-09 DIAGNOSIS — J45909 Unspecified asthma, uncomplicated: Secondary | ICD-10-CM

## 2013-01-09 MED ORDER — CEFDINIR 300 MG PO CAPS: 300.0000 mg | ORAL_CAPSULE | Freq: Two times a day (BID) | ORAL | Status: DC

## 2013-01-09 MED ORDER — PREDNISONE 10 MG PO TABS: ORAL_TABLET | ORAL | Status: DC

## 2013-01-09 NOTE — Progress Notes (Signed)
Pre-visit discussion using our clinic review tool. No additional management support is needed unless otherwise documented below in the visit note.  

## 2013-01-09 NOTE — Telephone Encounter (Signed)
Pt coming in to see Dr. Lorin Picket today at 1:15

## 2013-01-09 NOTE — Telephone Encounter (Signed)
Pt states she needs an appointment due to lung issues.  Appt accepted with Dr. Dan Humphreys at 9:15, but prefers to see Dr. Lorin Picket.  If possible to see Dr. Lorin Picket please call pt asap so we can cancel appt with Dr. Dan Humphreys as pt really prefers to see Dr. Lorin Picket.

## 2013-01-13 ENCOUNTER — Encounter: Payer: Self-pay | Admitting: Internal Medicine

## 2013-01-13 NOTE — Assessment & Plan Note (Signed)
Treat current flare and infection as outlined.  Follow.

## 2013-01-13 NOTE — Progress Notes (Signed)
Subjective:    [name], female    DOB: May 09, 1945, 67 y.o.   MRN: [id number]  Cough  67 year old female with past history of anxiety/depression and late onset asthma/COPD who comes in today as a work in with concerns regarding increased cough, congestion and wheezing.  She reports that symptoms started over the last two days.  No sinus pressure.  Increased cough and sore throat.  Some wheezing.  Tmax 100.3.   Cough is productive of colored mucus at times.  No chest tightness.  Using her spiriva.  No nausea or vomiting.   Bowels moving.  Some decreased appetite.     Past Medical History  Diagnosis Date  . Scoliosis   . Depression   . Hypertension   . Asthma   . COPD (chronic obstructive pulmonary disease)   . Ulcer   . Bronchitis   . Gastritis   . Arthritis   . Chronic headaches   . GERD (gastroesophageal reflux disease)     Current Outpatient Prescriptions on File Prior to Visit  Medication Sig Dispense Refill  . citalopram (CELEXA) 40 MG tablet Take 1 tablet (40 mg total) by mouth daily.  30 tablet  2  . clonazePAM (KLONOPIN) 0.5 MG tablet Take 1 to 1 1/2 tablets by mouth twice daily  45 tablet  2  . fluticasone (FLONASE) 50 MCG/ACT nasal spray Place 2 sprays into the nose daily as needed.      . hydrochlorothiazide (HYDRODIURIL) 25 MG tablet Take 1 tablet (25 mg total) by mouth daily.  30 tablet  5  . levalbuterol (XOPENEX HFA) 45 MCG/ACT inhaler Inhale 1-2 puffs into the lungs every 6 (six) hours as needed.      Marland Kitchen omeprazole (PRILOSEC) 20 MG capsule Take 1 capsule (20 mg total) by mouth 2 (two) times daily.  60 capsule  3  . Probiotic Product (PROBIOTIC DAILY PO) Take by mouth.      . ranitidine (ZANTAC) 150 MG tablet Take 150 mg by mouth at bedtime.      Marland Kitchen tiotropium (SPIRIVA) 18 MCG inhalation capsule Place 1 capsule (18 mcg total) into inhaler and inhale daily.  30 capsule  5   Current Facility-Administered Medications on File Prior to Visit  Medication Dose  Route Frequency Provider Last Rate Last Dose  . albuterol (PROVENTIL) (2.5 MG/3ML) 0.083% nebulizer solution 2.5 mg  2.5 mg Nebulization Once Charm Barges, MD        Review of Systems  Respiratory: Positive for cough.   Patient denies any lightheadedness or dizziness.  Does report the increased cough and congestion.  No sinus pressure.  Sore throat.  Some wheezing.  Tmax 100.3.   Cough productive.   No nausea or vomiting.  No abdominal pain or cramping.  No bowel change, such as diarrhea or constipation.        Objective:   Physical Exam  Filed Vitals:   01/09/13 1325  BP: 110/70  Pulse: 77  Temp: 98 F (36.33 C)   67 year old female in no acute distress.   HEENT:  Nares - clear.  OP- without lesions.  Posterior oropharynx - without lesions or erythema.   NECK:  Supple.  Non tender.       HEART:  Appears to be regular. LUNGS:  Without crackles.  Increased cough with expiration especially forced expiration.  Respirations even and unlabored.  No increased wheezing.  Assessment & Plan:  PROBABLE URI.  Has a history of asthma.  With increased cough and wheezing.   Continue spiriva.  Use the xopenex inhaler as directed.  Unable to tolerate steroid inhalers.  Will treat with omnicef 300mg  x 10 days.  Prednisone taper starting at 40mg  and decreasing by 5mg  each day until off.  Use the saline nasal spray and Flonase as directed.  Follow.  Notify me if symptoms worsen or do not resolve.

## 2013-01-26 ENCOUNTER — Ambulatory Visit: Payer: Self-pay | Admitting: Unknown Physician Specialty

## 2013-02-02 LAB — PATHOLOGY REPORT

## 2013-02-03 ENCOUNTER — Encounter: Payer: Self-pay | Admitting: Internal Medicine

## 2013-02-03 DIAGNOSIS — K219 Gastro-esophageal reflux disease without esophagitis: Secondary | ICD-10-CM

## 2013-02-06 ENCOUNTER — Telehealth: Payer: Self-pay | Admitting: *Deleted

## 2013-02-06 NOTE — Telephone Encounter (Signed)
Pt.notified

## 2013-02-06 NOTE — Telephone Encounter (Signed)
Pt states that she never heard back from her endoscopy performed by Dr. Tandy Gaw to know if you have seen the results?

## 2013-02-06 NOTE — Telephone Encounter (Signed)
No, I do not have report of an endoscopy.  If she will call GI and ask for results and when she talks to them - have them send me a copy.

## 2013-02-09 ENCOUNTER — Other Ambulatory Visit: Payer: Self-pay | Admitting: Internal Medicine

## 2013-02-09 ENCOUNTER — Encounter: Payer: Self-pay | Admitting: Internal Medicine

## 2013-02-09 ENCOUNTER — Ambulatory Visit (INDEPENDENT_AMBULATORY_CARE_PROVIDER_SITE_OTHER): Payer: Medicare PPO | Admitting: Internal Medicine

## 2013-02-09 VITALS — BP 110/80 | HR 77 | Temp 98.2°F | Ht 61.25 in | Wt 164.0 lb

## 2013-02-09 DIAGNOSIS — R739 Hyperglycemia, unspecified: Secondary | ICD-10-CM

## 2013-02-09 DIAGNOSIS — K2990 Gastroduodenitis, unspecified, without bleeding: Secondary | ICD-10-CM

## 2013-02-09 DIAGNOSIS — E7800 Pure hypercholesterolemia, unspecified: Secondary | ICD-10-CM

## 2013-02-09 DIAGNOSIS — J45909 Unspecified asthma, uncomplicated: Secondary | ICD-10-CM

## 2013-02-09 DIAGNOSIS — R0602 Shortness of breath: Secondary | ICD-10-CM

## 2013-02-09 DIAGNOSIS — R7309 Other abnormal glucose: Secondary | ICD-10-CM

## 2013-02-09 DIAGNOSIS — Z1211 Encounter for screening for malignant neoplasm of colon: Secondary | ICD-10-CM

## 2013-02-09 DIAGNOSIS — K2970 Gastritis, unspecified, without bleeding: Secondary | ICD-10-CM

## 2013-02-09 DIAGNOSIS — I10 Essential (primary) hypertension: Secondary | ICD-10-CM

## 2013-02-09 DIAGNOSIS — Z86010 Personal history of colonic polyps: Secondary | ICD-10-CM

## 2013-02-09 DIAGNOSIS — K219 Gastro-esophageal reflux disease without esophagitis: Secondary | ICD-10-CM

## 2013-02-09 NOTE — Progress Notes (Signed)
Order placed for labs.

## 2013-02-09 NOTE — Progress Notes (Signed)
Pre-visit discussion using our clinic review tool. No additional management support is needed unless otherwise documented below in the visit note.  

## 2013-02-10 ENCOUNTER — Telehealth: Payer: Self-pay | Admitting: Internal Medicine

## 2013-02-10 DIAGNOSIS — R0602 Shortness of breath: Secondary | ICD-10-CM

## 2013-02-10 NOTE — Telephone Encounter (Signed)
See below

## 2013-02-10 NOTE — Telephone Encounter (Signed)
Echo ordered.

## 2013-02-10 NOTE — Telephone Encounter (Signed)
Patient called in to check the status of what she thinks is an ultrasound. She stated that Dr. Nicki Reaper told her yesterday that she needed this done. I don't see an order please place order so we can get this referral taken care of for the patient.

## 2013-02-12 ENCOUNTER — Encounter: Payer: Self-pay | Admitting: Internal Medicine

## 2013-02-12 NOTE — Assessment & Plan Note (Signed)
Recent cholesterol check 11/03/12 revealed total cholesterol 227, triglycerides 120, HDL 60 and LDL 148.  Low cholesterol diet and exercise.  Follow.

## 2013-02-12 NOTE — Assessment & Plan Note (Signed)
On prilosec bid (before breakfast and before supper) and zantac in the evening.  Referred to GI for evaluation.  Had EGD and CT abdomen.  Results as outlined.  Awaiting pathology results.  Will discuss with GI regarding her breakthrough symptoms.

## 2013-02-12 NOTE — Progress Notes (Signed)
Subjective:    [name], female    DOB: [birth year], 68 y.o.   MRN: [id number]  HPI 68 year old female with past history of anxiety/depression and late onset asthma/COPD who comes in today for a scheduled follow up.   Breathing has been stable.  She was having some stomach issues.  Described intermittent epigastric discomfort.  See previous notes for details.  Now taking prilosec and zantac.  Just saw GI.  Had EGD and CT abdomen.  Found to have gastritis and a single gastric polyp.  CT revealed no acute abdominal abnormality.   Acid better.   Appetite good.  Still some occasional break through symptoms.  No vomiting or diarrhea.   No blood.  Bowels better on probiotic.  No chest pain or tightness.  Some sob with exertion.   No chest congestion.  Sugars slightly increased previously.  discussed diet adjustment.     Past Medical History  Diagnosis Date  . Scoliosis   . Depression   . Hypertension   . Asthma   . COPD (chronic obstructive pulmonary disease)   . Ulcer   . Bronchitis   . Gastritis   . Arthritis   . Chronic headaches   . GERD (gastroesophageal reflux disease)     Current Outpatient Prescriptions on File Prior to Visit  Medication Sig Dispense Refill  . citalopram (CELEXA) 40 MG tablet Take 1 tablet (40 mg total) by mouth daily.  30 tablet  2  . clonazePAM (KLONOPIN) 0.5 MG tablet Take 1 to 1 1/2 tablets by mouth twice daily  45 tablet  2  . fluticasone (FLONASE) 50 MCG/ACT nasal spray Place 2 sprays into the nose daily as needed.      . hydrochlorothiazide (HYDRODIURIL) 25 MG tablet Take 1 tablet (25 mg total) by mouth daily.  30 tablet  5  . levalbuterol (XOPENEX HFA) 45 MCG/ACT inhaler Inhale 1-2 puffs into the lungs every 6 (six) hours as needed.      Marland Kitchen omeprazole (PRILOSEC) 20 MG capsule Take 1 capsule (20 mg total) by mouth 2 (two) times daily.  60 capsule  3  . Probiotic Product (PROBIOTIC DAILY PO) Take by mouth.      . ranitidine (ZANTAC) 150 MG tablet  Take 150 mg by mouth at bedtime.      Marland Kitchen tiotropium (SPIRIVA) 18 MCG inhalation capsule Place 1 capsule (18 mcg total) into inhaler and inhale daily.  30 capsule  5   Current Facility-Administered Medications on File Prior to Visit  Medication Dose Route Frequency Provider Last Rate Last Dose  . albuterol (PROVENTIL) (2.5 MG/3ML) 0.083% nebulizer solution 2.5 mg  2.5 mg Nebulization Once Alisa Graff, MD        Review of Systems Patient denies any lightheadedness or dizziness.  No significant sinus symptoms.   No chest pain, tightness or palpitations.  No increased shortness of breath, cough or congestion.  Breathing stable. Does report some sob with exertion.  Some persistent discomfort - epigastric region and up.  No nausea or vomiting.  Still some occasional reflux and breakthrough symptoms.  Just had EGD and CT as outlined.  Plans to f/u with GI.  No bowel change.  Probiotic is helping.  No constipation, BRBPR or melana.  No urine change.  Taking prilosec and zantac regularly.       Objective:   Physical Exam  Filed Vitals:   02/09/13 0954  BP: 110/80  Pulse: 77  Temp: 98.2 F (36.8 C)  68 year old female in no acute distress.   HEENT:  Nares- clear.  Oropharynx - without lesions. NECK:  Supple.  Nontender.  No audible bruit.  HEART:  Appears to be regular. LUNGS:  No crackles or wheezing audible.  Respirations even and unlabored.  RADIAL PULSE:  Equal bilaterally.   ABDOMEN:  Soft.  No significant tenderness.  Minimal epigastric tenderness.  No rebound or guarding.  Bowel sounds present and normal.  No audible abdominal bruit.  EXTREMITIES:  No increased edema present.  DP pulses palpable and equal bilaterally.             Assessment & Plan:  CARDIOVASCULAR.  Some sob with exertion.  Has had extensive GI w/up for the pain and discomfort as outlined.  EKG obtained and revealed SR with no acute ischemic changes.  Will check ECHO to evaluated valve status, LV function and  pulmonary artery pressure.    EPIGASTRIC PAIN.  Improved.  On prilosec and zantac as outlined.  Recent abdominal ultrasound negative.  Abdominal CT negative.  EGD with gastritis and a gastric polyp (removed).  Awaiting pathology results.  She will contact them regarding persistent intermittent symptoms.      HEALTH MAINTENANCE.  Physical 05/11/12.   She is s/p hysterectomy.  Colonoscopy 02/09/10 revealed rectal polyp and internal hemorrhoids.  Mammogram 05/30/12 - Birads I.

## 2013-02-12 NOTE — Assessment & Plan Note (Signed)
No cough and congestion.  Follow.

## 2013-02-12 NOTE — Assessment & Plan Note (Signed)
Sugar slightly increased.  Follow fasting glucose and a1c.

## 2013-02-12 NOTE — Assessment & Plan Note (Signed)
Colonoscopy 02/09/10 revealed a rectal polyp and internal hemorrhoids.

## 2013-02-12 NOTE — Assessment & Plan Note (Signed)
Blood pressure doing well.  Same med regimen.  Follow metabolic panel.    

## 2013-02-12 NOTE — Assessment & Plan Note (Signed)
On omeprazole and zantac as outlined.  GI w/u as outlined.  Follow.  F/u with GI as discussed.

## 2013-02-22 ENCOUNTER — Other Ambulatory Visit: Payer: Self-pay | Admitting: *Deleted

## 2013-02-22 MED ORDER — OMEPRAZOLE 20 MG PO CPDR: 20.0000 mg | DELAYED_RELEASE_CAPSULE | Freq: Two times a day (BID) | ORAL | Status: DC

## 2013-02-23 ENCOUNTER — Other Ambulatory Visit: Payer: Self-pay

## 2013-02-23 ENCOUNTER — Other Ambulatory Visit (INDEPENDENT_AMBULATORY_CARE_PROVIDER_SITE_OTHER): Payer: Medicare PPO

## 2013-02-23 DIAGNOSIS — R0602 Shortness of breath: Secondary | ICD-10-CM

## 2013-02-24 ENCOUNTER — Encounter: Payer: Self-pay | Admitting: *Deleted

## 2013-02-24 ENCOUNTER — Encounter: Payer: Self-pay | Admitting: Internal Medicine

## 2013-02-24 DIAGNOSIS — K219 Gastro-esophageal reflux disease without esophagitis: Secondary | ICD-10-CM

## 2013-02-28 ENCOUNTER — Encounter: Payer: Self-pay | Admitting: Internal Medicine

## 2013-03-02 ENCOUNTER — Encounter: Payer: Self-pay | Admitting: Internal Medicine

## 2013-03-02 ENCOUNTER — Telehealth: Payer: Self-pay | Admitting: Internal Medicine

## 2013-03-02 ENCOUNTER — Ambulatory Visit (INDEPENDENT_AMBULATORY_CARE_PROVIDER_SITE_OTHER)
Admission: RE | Admit: 2013-03-02 | Discharge: 2013-03-02 | Disposition: A | Discharge status: Home / Self Care | Payer: Medicare PPO | Source: Ambulatory Visit | Attending: Internal Medicine | Admitting: Internal Medicine

## 2013-03-02 ENCOUNTER — Ambulatory Visit (INDEPENDENT_AMBULATORY_CARE_PROVIDER_SITE_OTHER): Payer: Medicare PPO | Admitting: Internal Medicine

## 2013-03-02 VITALS — BP 130/70 | HR 109 | Temp 100.0°F | Ht 61.25 in | Wt 161.8 lb

## 2013-03-02 DIAGNOSIS — R05 Cough: Secondary | ICD-10-CM

## 2013-03-02 DIAGNOSIS — J069 Acute upper respiratory infection, unspecified: Secondary | ICD-10-CM

## 2013-03-02 DIAGNOSIS — J45909 Unspecified asthma, uncomplicated: Secondary | ICD-10-CM

## 2013-03-02 DIAGNOSIS — J111 Influenza due to unidentified influenza virus with other respiratory manifestations: Secondary | ICD-10-CM

## 2013-03-02 DIAGNOSIS — R6889 Other general symptoms and signs: Secondary | ICD-10-CM

## 2013-03-02 DIAGNOSIS — R059 Cough, unspecified: Secondary | ICD-10-CM

## 2013-03-02 LAB — POCT INFLUENZA A/B
Influenza A, POC: NEGATIVE
Influenza B, POC: NEGATIVE

## 2013-03-02 MED ORDER — OSELTAMIVIR PHOSPHATE 75 MG PO CAPS: 75.0000 mg | ORAL_CAPSULE | Freq: Two times a day (BID) | ORAL | Status: DC

## 2013-03-02 MED ORDER — PREDNISONE 10 MG PO TABS: ORAL_TABLET | ORAL | Status: DC

## 2013-03-02 MED ORDER — AMOXICILLIN-POT CLAVULANATE 875-125 MG PO TABS: 1.0000 | ORAL_TABLET | Freq: Two times a day (BID) | ORAL | Status: DC

## 2013-03-02 NOTE — Telephone Encounter (Signed)
See if she can come in at 1:15 today for work in for this problem.

## 2013-03-02 NOTE — Telephone Encounter (Signed)
Appointment made pt aware

## 2013-03-02 NOTE — Telephone Encounter (Signed)
Pt calling to see if Dr. Nicki Reaper is going to see her today.  Please see previous phone note.

## 2013-03-02 NOTE — Telephone Encounter (Signed)
The patient is wanting to be seen today by Dr. Nicki Reaper. I offered her an appointment with [facility] and the patient stated when she is having coughing ,fever and aching that Dr. Nicki Reaper wants her seen in the office. Please advise.

## 2013-03-03 ENCOUNTER — Encounter: Payer: Self-pay | Admitting: Internal Medicine

## 2013-03-03 DIAGNOSIS — J069 Acute upper respiratory infection, unspecified: Secondary | ICD-10-CM | POA: Insufficient documentation

## 2013-03-03 NOTE — Assessment & Plan Note (Signed)
With the acute fever, body aches and current symptoms - flu swab obtained.  Negative.  Will go ahead and treat with Tamiflu 75mg  bid.  Follow closely.

## 2013-03-03 NOTE — Assessment & Plan Note (Signed)
Symptoms as outlined.  Treat for influenza as outlined.  Also, cover for possible bacterial infection.  Treat with augmentin bid as directed.  Prednisone taper starting at 60mg  and decreasing by 5mg  until off.  Inhalers as directed.  Nebs if needed.  Check cxr.  She is to be reevaluated if symptoms change, worsen or do not resolve.

## 2013-03-03 NOTE — Assessment & Plan Note (Signed)
Treat acute infection.  Prednisone as directed.  Inhalers and nebs as directed.  Follow.

## 2013-03-03 NOTE — Progress Notes (Signed)
Subjective:    [name], female    DOB: [birth year], 68 y.o.   MRN: [id number]  Cough  68 year old female with past history of anxiety/depression and late onset asthma/COPD who comes in today as a work in with concerns regarding cough, congestion and fever.  Reports symptoms started less than 48 hours ago.  Reports increased aching and fever (Tmax 101.3).  Increased sinus pressure.  Headache.  Nasal congestion and sore throat.  Increased drainage.  Productive cough.  Wheezing.  No vomiting.  Some minimal nausea which she feels is related to the drainage.  Taking robitussin.  No diarrhea.  Tolerating po's.      Past Medical History  Diagnosis Date  . Scoliosis   . Depression   . Hypertension   . Asthma   . COPD (chronic obstructive pulmonary disease)   . Ulcer   . Bronchitis   . Gastritis   . Arthritis   . Chronic headaches   . GERD (gastroesophageal reflux disease)     Current Outpatient Prescriptions on File Prior to Visit  Medication Sig Dispense Refill  . citalopram (CELEXA) 40 MG tablet Take 1 tablet (40 mg total) by mouth daily.  30 tablet  2  . clonazePAM (KLONOPIN) 0.5 MG tablet Take 1 to 1 1/2 tablets by mouth twice daily  45 tablet  2  . fluticasone (FLONASE) 50 MCG/ACT nasal spray Place 2 sprays into the nose daily as needed.      . hydrochlorothiazide (HYDRODIURIL) 25 MG tablet Take 1 tablet (25 mg total) by mouth daily.  30 tablet  5  . levalbuterol (XOPENEX HFA) 45 MCG/ACT inhaler Inhale 1-2 puffs into the lungs every 6 (six) hours as needed.      Marland Kitchen omeprazole (PRILOSEC) 20 MG capsule Take 1 capsule (20 mg total) by mouth 2 (two) times daily.  60 capsule  5  . Probiotic Product (PROBIOTIC DAILY PO) Take by mouth.      . ranitidine (ZANTAC) 150 MG tablet Take 150 mg by mouth at bedtime.      Marland Kitchen tiotropium (SPIRIVA) 18 MCG inhalation capsule Place 1 capsule (18 mcg total) into inhaler and inhale daily.  30 capsule  5   Current Facility-Administered  Medications on File Prior to Visit  Medication Dose Route Frequency Provider Last Rate Last Dose  . albuterol (PROVENTIL) (2.5 MG/3ML) 0.083% nebulizer solution 2.5 mg  2.5 mg Nebulization Once Alisa Graff, MD        Review of Systems  Respiratory: Positive for cough.   Sinus pressure, sore throat and drainage as outlined.  Fever and body aches.  Increased cough and congestion.  Some wheezing.  No acid reflux.  No diarrhea.  Is eating and drinking.       Objective:   Physical Exam  Filed Vitals:   03/02/13 1319  BP: 130/70  Pulse: 109  Temp: 100 F (41.81 C)   68 year old female in no acute distress.   HEENT:  Nares- slightly erythematous turbinates. TMs visualized without erythema.  Oropharynx - without lesions. NECK:  Supple.  Nontender. HEART:  Appears to be regular. LUNGS:  No crackles.  Increased cough with expiration and forced expiration.   RADIAL PULSE:  Equal bilaterally.             Assessment & Plan:  HEALTH MAINTENANCE.  Physical 05/11/12.   She is s/p hysterectomy.  Colonoscopy 02/09/10 revealed rectal polyp and internal hemorrhoids.  Mammogram 05/30/12 - Birads I.

## 2013-04-05 ENCOUNTER — Encounter: Payer: Self-pay | Admitting: Internal Medicine

## 2013-04-06 ENCOUNTER — Telehealth: Payer: Self-pay | Admitting: Internal Medicine

## 2013-04-06 ENCOUNTER — Other Ambulatory Visit: Payer: Self-pay | Admitting: *Deleted

## 2013-04-06 MED ORDER — CITALOPRAM HYDROBROMIDE 40 MG PO TABS: 40.0000 mg | ORAL_TABLET | Freq: Every day | ORAL | Status: DC

## 2013-04-06 MED ORDER — HYDROCHLOROTHIAZIDE 25 MG PO TABS: 25.0000 mg | ORAL_TABLET | Freq: Every day | ORAL | Status: DC

## 2013-04-06 MED ORDER — TIOTROPIUM BROMIDE MONOHYDRATE 18 MCG IN CAPS: 18.0000 ug | ORAL_CAPSULE | Freq: Every day | RESPIRATORY_TRACT | Status: DC

## 2013-04-06 NOTE — Addendum Note (Signed)
Addended by: Wynonia Lawman E on: 04/06/2013 01:53 PM   Modules accepted: Orders

## 2013-04-06 NOTE — Telephone Encounter (Signed)
I see 4:00 today is on hold. What would you like me to tell her?

## 2013-04-06 NOTE — Telephone Encounter (Signed)
Patient has been scheduled

## 2013-04-06 NOTE — Telephone Encounter (Signed)
The patient is wanting to be worked in to be seen tomorrow because of her lung issues , She is wheezing and coughing no fever having a hard time breathing.

## 2013-04-06 NOTE — Telephone Encounter (Signed)
I can see her at 11:45 tomorrow.  Note states she wants to be seen tomorrow.  If acute symptoms, let me know - I can try to work her in this pm.

## 2013-04-06 NOTE — Telephone Encounter (Signed)
Pt notified of appt-please schedule appt (it would not allow me to schedule it)

## 2013-04-07 ENCOUNTER — Ambulatory Visit (INDEPENDENT_AMBULATORY_CARE_PROVIDER_SITE_OTHER): Payer: Medicare PPO | Admitting: Internal Medicine

## 2013-04-07 ENCOUNTER — Encounter: Payer: Self-pay | Admitting: Internal Medicine

## 2013-04-07 VITALS — BP 102/62 | HR 79 | Temp 98.4°F | Resp 16 | Ht 61.25 in | Wt 164.5 lb

## 2013-04-07 DIAGNOSIS — K219 Gastro-esophageal reflux disease without esophagitis: Secondary | ICD-10-CM

## 2013-04-07 DIAGNOSIS — J45909 Unspecified asthma, uncomplicated: Secondary | ICD-10-CM

## 2013-04-07 MED ORDER — PREDNISONE (PAK) 10 MG PO TABS: ORAL_TABLET | Freq: Every day | ORAL | Status: DC

## 2013-04-07 NOTE — Progress Notes (Signed)
Pre visit review using our clinic review tool, if applicable. No additional management support is needed unless otherwise documented below in the visit note. 

## 2013-04-09 ENCOUNTER — Encounter: Payer: Self-pay | Admitting: Internal Medicine

## 2013-04-09 NOTE — Progress Notes (Signed)
Subjective:    [name], female    DOB: 1945-02-03, 68 y.o.   MRN: [id number]  Cough  68 year old female with past history of anxiety/depression and late onset asthma/COPD who comes in today as a work in with concerns regarding persistent cough and wheezing.   I saw her recently with respiratory infection.  See last note for details.  She feels better.  Still having some increased cough and wheezing.  Worse in the am.  No congestion.  No sinus pressure.  No fever.  Acid reflux controlled.   No diarrhea.  Tolerating po's.  Taking a probiotic.      Past Medical History  Diagnosis Date  . Scoliosis   . Depression   . Hypertension   . Asthma   . COPD (chronic obstructive pulmonary disease)   . Ulcer   . Bronchitis   . Gastritis   . Arthritis   . Chronic headaches   . GERD (gastroesophageal reflux disease)     Current Outpatient Prescriptions on File Prior to Visit  Medication Sig Dispense Refill  . citalopram (CELEXA) 40 MG tablet Take 1 tablet (40 mg total) by mouth daily.  30 tablet  2  . clonazePAM (KLONOPIN) 0.5 MG tablet Take 1 to 1 1/2 tablets by mouth twice daily  45 tablet  2  . fluticasone (FLONASE) 50 MCG/ACT nasal spray Place 2 sprays into the nose daily as needed.      . hydrochlorothiazide (HYDRODIURIL) 25 MG tablet Take 1 tablet (25 mg total) by mouth daily.  30 tablet  5  . levalbuterol (XOPENEX HFA) 45 MCG/ACT inhaler Inhale 1-2 puffs into the lungs every 6 (six) hours as needed.      Marland Kitchen omeprazole (PRILOSEC) 20 MG capsule Take 1 capsule (20 mg total) by mouth 2 (two) times daily.  60 capsule  5  . Probiotic Product (PROBIOTIC DAILY PO) Take by mouth.      . ranitidine (ZANTAC) 150 MG tablet Take 150 mg by mouth at bedtime.      Marland Kitchen tiotropium (SPIRIVA) 18 MCG inhalation capsule Place 1 capsule (18 mcg total) into inhaler and inhale daily.  30 capsule  5   Current Facility-Administered Medications on File Prior to Visit  Medication Dose Route Frequency  Provider Last Rate Last Dose  . albuterol (PROVENTIL) (2.5 MG/3ML) 0.083% nebulizer solution 2.5 mg  2.5 mg Nebulization Once Alisa Graff, MD        Review of Systems  Respiratory: Positive for cough.   No increased sinus pressure, nasal congestion or drainage.  No fever.  No headache.  Does report some increased cough and wheezing.  No chest congestion.   No acid reflux.  No diarrhea.  Is eating and drinking.       Objective:   Physical Exam  Filed Vitals:   04/07/13 1149  BP: 102/62  Pulse: 79  Temp: 98.4 F (36.9 C)  Resp: 43   68 year old female in no acute distress.   HEENT:  Nares- clear. TMs visualized without erythema.  Oropharynx - without lesions. NECK:  Supple.  Nontender. HEART:  Appears to be regular. LUNGS:  No crackles.  Increased cough with expiration and forced expiration.  Minimal wheezing with forced expiration.     RADIAL PULSE:  Equal bilaterally.             Assessment & Plan:  HEALTH MAINTENANCE.  Physical 05/11/12.   She is s/p hysterectomy.  Colonoscopy 02/09/10 revealed  rectal polyp and internal hemorrhoids.  Mammogram 05/30/12 - Birads I.

## 2013-04-09 NOTE — Assessment & Plan Note (Signed)
No evidence of ongoing infection.  Do not feel abx warranted.   Prednisone taper as directed.  Inhalers and nebs as directed.  Follow.  Notify me if persistent symptoms.

## 2013-04-09 NOTE — Assessment & Plan Note (Signed)
On prilosec bid (before breakfast and before supper) and zantac in the evening.  Referred to GI for evaluation.  Had EGD and CT abdomen.  Results as outlined.  Currently controlled.    

## 2013-04-27 ENCOUNTER — Other Ambulatory Visit: Payer: Self-pay | Admitting: *Deleted

## 2013-04-27 MED ORDER — CLONAZEPAM 0.5 MG PO TABS: ORAL_TABLET | ORAL | Status: DC

## 2013-04-27 NOTE — Telephone Encounter (Signed)
Refilled clonazepam #45 with 2 refills

## 2013-04-27 NOTE — Telephone Encounter (Signed)
Okay to refill? 

## 2013-04-28 ENCOUNTER — Ambulatory Visit: Payer: Self-pay | Admitting: Unknown Physician Specialty

## 2013-05-02 LAB — PATHOLOGY REPORT

## 2013-05-09 ENCOUNTER — Encounter: Payer: Self-pay | Admitting: Internal Medicine

## 2013-05-09 DIAGNOSIS — K219 Gastro-esophageal reflux disease without esophagitis: Secondary | ICD-10-CM

## 2013-05-16 ENCOUNTER — Telehealth: Payer: Self-pay | Admitting: Internal Medicine

## 2013-05-16 DIAGNOSIS — Z1239 Encounter for other screening for malignant neoplasm of breast: Secondary | ICD-10-CM

## 2013-05-16 NOTE — Telephone Encounter (Signed)
Would like [place] to schedule her mammo with [name].  No Tuesdays.  Wants morning appt if possible.

## 2013-05-17 NOTE — Telephone Encounter (Signed)
Order placed for mammogram.

## 2013-05-19 ENCOUNTER — Ambulatory Visit (INDEPENDENT_AMBULATORY_CARE_PROVIDER_SITE_OTHER): Payer: Medicare PPO | Admitting: Internal Medicine

## 2013-05-19 ENCOUNTER — Encounter: Payer: Self-pay | Admitting: Internal Medicine

## 2013-05-19 ENCOUNTER — Encounter (INDEPENDENT_AMBULATORY_CARE_PROVIDER_SITE_OTHER): Payer: Self-pay

## 2013-05-19 VITALS — BP 110/70 | HR 98 | Temp 98.4°F | Ht 61.25 in | Wt 165.0 lb

## 2013-05-19 DIAGNOSIS — R062 Wheezing: Secondary | ICD-10-CM

## 2013-05-19 DIAGNOSIS — K219 Gastro-esophageal reflux disease without esophagitis: Secondary | ICD-10-CM

## 2013-05-19 DIAGNOSIS — J45909 Unspecified asthma, uncomplicated: Secondary | ICD-10-CM

## 2013-05-19 DIAGNOSIS — J069 Acute upper respiratory infection, unspecified: Secondary | ICD-10-CM

## 2013-05-19 MED ORDER — ALBUTEROL SULFATE (2.5 MG/3ML) 0.083% IN NEBU
2.5000 mg | INHALATION_SOLUTION | Freq: Once | RESPIRATORY_TRACT | Status: AC
  Administered 2013-05-19: 2.5 mg via RESPIRATORY_TRACT

## 2013-05-19 MED ORDER — AMOXICILLIN-POT CLAVULANATE 875-125 MG PO TABS: 1.0000 | ORAL_TABLET | Freq: Two times a day (BID) | ORAL | Status: DC

## 2013-05-19 MED ORDER — PREDNISONE 10 MG PO TABS: ORAL_TABLET | ORAL | Status: DC

## 2013-05-19 NOTE — Progress Notes (Signed)
Pre visit review using our clinic review tool, if applicable. No additional management support is needed unless otherwise documented below in the visit note. 

## 2013-05-21 ENCOUNTER — Encounter: Payer: Self-pay | Admitting: Internal Medicine

## 2013-05-21 NOTE — Progress Notes (Signed)
  Subjective:    [name], female    DOB: 15-Apr-1945, 68 y.o.   MRN: [id number]  Cough  68 year old female with past history of anxiety/depression and late onset asthma/COPD who comes in today as a work in with concerns regarding increased cough and wheezing.   She reports that symptoms started yesterday.  Have worsened quickly.  Reports low grade temp (99.1).  Increased chest congestion and wheezing.  No significant sinus pressure.  No vomiting or diarrhea.  Drinking fluids.       Past Medical History  Diagnosis Date  . Scoliosis   . Depression   . Hypertension   . Asthma   . COPD (chronic obstructive pulmonary disease)   . Ulcer   . Bronchitis   . Gastritis   . Arthritis   . Chronic headaches   . GERD (gastroesophageal reflux disease)     Current Outpatient Prescriptions on File Prior to Visit  Medication Sig Dispense Refill  . citalopram (CELEXA) 40 MG tablet Take 1 tablet (40 mg total) by mouth daily.  30 tablet  2  . clonazePAM (KLONOPIN) 0.5 MG tablet Take 1 to 1 1/2 tablets by mouth twice daily  45 tablet  2  . fluticasone (FLONASE) 50 MCG/ACT nasal spray Place 2 sprays into the nose daily as needed.      . hydrochlorothiazide (HYDRODIURIL) 25 MG tablet Take 1 tablet (25 mg total) by mouth daily.  30 tablet  5  . levalbuterol (XOPENEX HFA) 45 MCG/ACT inhaler Inhale 1-2 puffs into the lungs every 6 (six) hours as needed.      Marland Kitchen omeprazole (PRILOSEC) 20 MG capsule Take 1 capsule (20 mg total) by mouth 2 (two) times daily.  60 capsule  5  . Probiotic Product (PROBIOTIC DAILY PO) Take by mouth.      . ranitidine (ZANTAC) 150 MG tablet Take 150 mg by mouth at bedtime.      Marland Kitchen tiotropium (SPIRIVA) 18 MCG inhalation capsule Place 1 capsule (18 mcg total) into inhaler and inhale daily.  30 capsule  5   Current Facility-Administered Medications on File Prior to Visit  Medication Dose Route Frequency Provider Last Rate Last Dose  . albuterol (PROVENTIL) (2.5 MG/3ML)  0.083% nebulizer solution 2.5 mg  2.5 mg Nebulization Once Alisa Graff, MD        Review of Systems  Respiratory: Positive for cough.   No increased sinus pressure, nasal congestion or drainage.  Low grade fever as outlined.  No headache.  Does report some increased cough and wheezing.  Increased chest congestion.   No acid reflux.  No diarrhea.  Is eating and drinking.  No vomiting.       Objective:   Physical Exam  Filed Vitals:   05/19/13 1439  BP: 110/70  Pulse: 98  Temp: 98.4 F (24.38 C)   68 year old female in no acute distress.   HEENT:  Nares- clear. TMs visualized without erythema.  Oropharynx - without lesions. NECK:  Supple.  Nontender. HEART:  Appears to be regular. LUNGS:  No crackles.  Increased cough with expiration and forced expiration.  Minimal wheezing with forced expiration.     RADIAL PULSE:  Equal bilaterally.             Assessment & Plan:  HEALTH MAINTENANCE.  Physical 05/11/12.   She is s/p hysterectomy.  Colonoscopy 02/09/10 revealed rectal polyp and internal hemorrhoids.  Mammogram 05/30/12 - Birads I.

## 2013-05-21 NOTE — Assessment & Plan Note (Signed)
Symptoms as outlined.  Will cover for possible bacterial infection.  Treat with augmentin bid as directed.  Prednisone taper starting at 60mg  and decreasing by 5mg  until off.  Inhalers as directed.  Nebs if needed.  She is to be reevaluated if symptoms change, worsen or do not resolve.  Keep appt with pulmonary.

## 2013-05-21 NOTE — Assessment & Plan Note (Signed)
Treat for possible infection as outlined.   Prednisone taper as directed.  Inhalers and nebs as directed.  Initially pulse ox varying from 89-93% room air.  Was given an albuterol neb here in the office.  Felt much better.  Lungs more open.  Pulse ox increased to 95-96% on room air.   Treatment as outlined.  Has f/u with her pulmonologist in 6/15.  Notify me if worsening problems.

## 2013-05-21 NOTE — Assessment & Plan Note (Signed)
On prilosec bid (before breakfast and before supper) and zantac in the evening.  Referred to GI for evaluation.  Had EGD and CT abdomen.  Results as outlined.  Currently controlled.

## 2013-05-25 ENCOUNTER — Encounter: Payer: Self-pay | Admitting: *Deleted

## 2013-05-25 ENCOUNTER — Encounter: Payer: BC Managed Care – PPO | Admitting: Internal Medicine

## 2013-06-06 ENCOUNTER — Encounter (INDEPENDENT_AMBULATORY_CARE_PROVIDER_SITE_OTHER): Payer: Self-pay

## 2013-06-06 ENCOUNTER — Ambulatory Visit (INDEPENDENT_AMBULATORY_CARE_PROVIDER_SITE_OTHER): Payer: Medicare PPO | Admitting: Internal Medicine

## 2013-06-06 ENCOUNTER — Encounter: Payer: Self-pay | Admitting: Internal Medicine

## 2013-06-06 VITALS — BP 112/74 | HR 78 | Temp 98.0°F | Resp 16 | Ht 61.25 in | Wt 164.0 lb

## 2013-06-06 DIAGNOSIS — H109 Unspecified conjunctivitis: Secondary | ICD-10-CM

## 2013-06-06 MED ORDER — LEVOFLOXACIN 0.5 % OP SOLN: 1.0000 [drp] | Freq: Four times a day (QID) | OPHTHALMIC | Status: DC

## 2013-06-06 NOTE — Progress Notes (Signed)
Pre visit review using our clinic review tool, if applicable. No additional management support is needed unless otherwise documented below in the visit note. 

## 2013-06-07 ENCOUNTER — Encounter: Payer: Self-pay | Admitting: Internal Medicine

## 2013-06-07 DIAGNOSIS — H109 Unspecified conjunctivitis: Secondary | ICD-10-CM | POA: Insufficient documentation

## 2013-06-07 NOTE — Progress Notes (Signed)
  Subjective:    [name], female    DOB: 15-Jul-1945, 68 y.o.   MRN: [id number]  Conjunctivitis   68 year old female with past history of anxiety/depression and late onset asthma/COPD who comes in today as a work in with concerns regarding "pink eye".  She reports that she has noticed some increased irritation/itching in her left eye.  Has progressed.  Increased drainage and "matting" of her eye.  No pain, just more irritation and itching.  No vision loss.  No fever.  No sinus symptoms.  Breathing better.      Past Medical History  Diagnosis Date  . Scoliosis   . Depression   . Hypertension   . Asthma   . COPD (chronic obstructive pulmonary disease)   . Ulcer   . Bronchitis   . Gastritis   . Arthritis   . Chronic headaches   . GERD (gastroesophageal reflux disease)     Current Outpatient Prescriptions on File Prior to Visit  Medication Sig Dispense Refill  . citalopram (CELEXA) 40 MG tablet Take 1 tablet (40 mg total) by mouth daily.  30 tablet  2  . clonazePAM (KLONOPIN) 0.5 MG tablet Take 1 to 1 1/2 tablets by mouth twice daily  45 tablet  2  . fluticasone (FLONASE) 50 MCG/ACT nasal spray Place 2 sprays into the nose daily as needed.      . hydrochlorothiazide (HYDRODIURIL) 25 MG tablet Take 1 tablet (25 mg total) by mouth daily.  30 tablet  5  . levalbuterol (XOPENEX HFA) 45 MCG/ACT inhaler Inhale 1-2 puffs into the lungs every 6 (six) hours as needed.      Marland Kitchen omeprazole (PRILOSEC) 20 MG capsule Take 1 capsule (20 mg total) by mouth 2 (two) times daily.  60 capsule  5  . Probiotic Product (PROBIOTIC DAILY PO) Take by mouth.      . ranitidine (ZANTAC) 150 MG tablet Take 150 mg by mouth at bedtime.      Marland Kitchen tiotropium (SPIRIVA) 18 MCG inhalation capsule Place 1 capsule (18 mcg total) into inhaler and inhale daily.  30 capsule  5   Current Facility-Administered Medications on File Prior to Visit  Medication Dose Route Frequency Provider Last Rate Last Dose  . albuterol  (PROVENTIL) (2.5 MG/3ML) 0.083% nebulizer solution 2.5 mg  2.5 mg Nebulization Once Alisa Graff, MD        Review of Systems Patient denies any lightheadedness or dizziness.  No significant sinus symptoms.   No increased shortness of breath, cough or congestion.  Breathing stable.  Eye symptoms as outlined.        Objective:   Physical Exam  Filed Vitals:   06/06/13 1609  BP: 112/74  Pulse: 78  Temp: 98 F (36.7 C)  Resp: 69   68 year old female in no acute distress.   HEENT:  Nares- clear.  Oropharynx - without lesions.  Left eye - erythema and minimal eyelid swelling.  No pain over the orbit.  EOMI.   NECK:  Supple.  Nontender.   HEART:  Appears to be regular. LUNGS:  No crackles or wheezing audible.  Respirations even and unlabored.             Assessment & Plan:  HEALTH MAINTENANCE.  Physical 05/11/12.   She is s/p hysterectomy.  Colonoscopy 02/09/10 revealed rectal polyp and internal hemorrhoids.  Mammogram 05/30/12 - Birads I.  Is scheduled for a physical soon.

## 2013-06-07 NOTE — Assessment & Plan Note (Addendum)
Eye symptoms and exam as outlined.  Concern over bacterial conjunctivitis.  Will treat with quixin eye drops as directed.  Follow closely.  Notify me if symptoms worsen or do not improve.

## 2013-06-15 ENCOUNTER — Ambulatory Visit (INDEPENDENT_AMBULATORY_CARE_PROVIDER_SITE_OTHER): Payer: Medicare PPO | Admitting: Internal Medicine

## 2013-06-15 ENCOUNTER — Encounter: Payer: Self-pay | Admitting: Internal Medicine

## 2013-06-15 VITALS — BP 110/70 | HR 81 | Temp 98.5°F | Resp 16 | Ht 61.0 in | Wt 166.8 lb

## 2013-06-15 DIAGNOSIS — R739 Hyperglycemia, unspecified: Secondary | ICD-10-CM

## 2013-06-15 DIAGNOSIS — I10 Essential (primary) hypertension: Secondary | ICD-10-CM

## 2013-06-15 DIAGNOSIS — K2970 Gastritis, unspecified, without bleeding: Secondary | ICD-10-CM

## 2013-06-15 DIAGNOSIS — H109 Unspecified conjunctivitis: Secondary | ICD-10-CM

## 2013-06-15 DIAGNOSIS — Z86010 Personal history of colonic polyps: Secondary | ICD-10-CM

## 2013-06-15 DIAGNOSIS — E7800 Pure hypercholesterolemia, unspecified: Secondary | ICD-10-CM

## 2013-06-15 DIAGNOSIS — Z1239 Encounter for other screening for malignant neoplasm of breast: Secondary | ICD-10-CM

## 2013-06-15 DIAGNOSIS — K2990 Gastroduodenitis, unspecified, without bleeding: Secondary | ICD-10-CM

## 2013-06-15 DIAGNOSIS — J45909 Unspecified asthma, uncomplicated: Secondary | ICD-10-CM

## 2013-06-15 DIAGNOSIS — R7309 Other abnormal glucose: Secondary | ICD-10-CM

## 2013-06-15 DIAGNOSIS — L989 Disorder of the skin and subcutaneous tissue, unspecified: Secondary | ICD-10-CM

## 2013-06-15 DIAGNOSIS — K219 Gastro-esophageal reflux disease without esophagitis: Secondary | ICD-10-CM

## 2013-06-15 LAB — CBC WITH DIFFERENTIAL/PLATELET
BASOS PCT: 1.1 % (ref 0.0–3.0)
Basophils Absolute: 0.1 10*3/uL (ref 0.0–0.1)
EOS PCT: 7 % — AB (ref 0.0–5.0)
Eosinophils Absolute: 0.5 10*3/uL (ref 0.0–0.7)
HCT: 41.1 % (ref 36.0–46.0)
HEMOGLOBIN: 13.9 g/dL (ref 12.0–15.0)
LYMPHS PCT: 25.4 % (ref 12.0–46.0)
Lymphs Abs: 1.8 10*3/uL (ref 0.7–4.0)
MCHC: 33.7 g/dL (ref 30.0–36.0)
MCV: 92.2 fl (ref 78.0–100.0)
MONO ABS: 0.4 10*3/uL (ref 0.1–1.0)
Monocytes Relative: 6.4 % (ref 3.0–12.0)
NEUTROS ABS: 4.2 10*3/uL (ref 1.4–7.7)
NEUTROS PCT: 60.1 % (ref 43.0–77.0)
Platelets: 232 10*3/uL (ref 150.0–400.0)
RBC: 4.46 Mil/uL (ref 3.87–5.11)
RDW: 13.2 % (ref 11.5–15.5)
WBC: 7 10*3/uL (ref 4.0–10.5)

## 2013-06-15 LAB — LIPID PANEL
CHOLESTEROL: 228 mg/dL — AB (ref 0–200)
HDL: 59.5 mg/dL (ref >39.00)
LDL Cholesterol: 144 mg/dL — ABNORMAL HIGH (ref 0–99)
Total CHOL/HDL Ratio: 4
Triglycerides: 125 mg/dL (ref 0.0–149.0)
VLDL: 25 mg/dL (ref 0.0–40.0)

## 2013-06-15 LAB — COMPREHENSIVE METABOLIC PANEL
ALBUMIN: 3.9 g/dL (ref 3.5–5.2)
ALT: 22 U/L (ref 0–35)
AST: 24 U/L (ref 0–37)
Alkaline Phosphatase: 58 U/L (ref 39–117)
BUN: 8 mg/dL (ref 6–23)
CO2: 31 meq/L (ref 19–32)
Calcium: 9.3 mg/dL (ref 8.4–10.5)
Chloride: 102 mEq/L (ref 96–112)
Creatinine, Ser: 0.6 mg/dL (ref 0.4–1.2)
GFR: 98.56 mL/min (ref >60.00)
GLUCOSE: 92 mg/dL (ref 70–99)
POTASSIUM: 4.3 meq/L (ref 3.5–5.1)
Sodium: 142 mEq/L (ref 135–145)
Total Bilirubin: 0.5 mg/dL (ref 0.2–1.2)
Total Protein: 6.7 g/dL (ref 6.0–8.3)

## 2013-06-15 LAB — TSH: TSH: 0.34 u[IU]/mL — ABNORMAL LOW (ref 0.35–4.50)

## 2013-06-15 NOTE — Progress Notes (Signed)
Pre visit review using our clinic review tool, if applicable. No additional management support is needed unless otherwise documented below in the visit note. 

## 2013-06-18 ENCOUNTER — Encounter: Payer: Self-pay | Admitting: Internal Medicine

## 2013-06-18 DIAGNOSIS — L989 Disorder of the skin and subcutaneous tissue, unspecified: Secondary | ICD-10-CM | POA: Insufficient documentation

## 2013-06-18 NOTE — Assessment & Plan Note (Signed)
Low cholesterol diet and exercise.  Follow lipid panel.   

## 2013-06-18 NOTE — Assessment & Plan Note (Signed)
Breathing currently doing well.  She is scheduled to follow up with her pulmonologist 06/27/13.  Continue current regimen.

## 2013-06-18 NOTE — Assessment & Plan Note (Signed)
Currently doing well.  EGD as outlined.

## 2013-06-18 NOTE — Progress Notes (Signed)
Subjective:    [name], female    DOB: April 07, 1945, 68 y.o.   MRN: [id number]  HPI 68 year old female with past history of anxiety/depression and late onset asthma/COPD who comes in today to follow upon these issues as well as for a complete physical exam.  Breathing has been stable.  No increased cough and congestion.  She was recently evaluated and diagnosed with conjunctivitis.  Treated.  Eyes better/resolved.  No nausea or vomiting.  No bowel change.  Bowels better on probiotic.  No chest pain or tightness.  Persistent left arm lesion.      Past Medical History  Diagnosis Date  . Scoliosis   . Depression   . Hypertension   . Asthma   . COPD (chronic obstructive pulmonary disease)   . Ulcer   . Bronchitis   . Gastritis   . Arthritis   . Chronic headaches   . GERD (gastroesophageal reflux disease)     Current Outpatient Prescriptions on File Prior to Visit  Medication Sig Dispense Refill  . citalopram (CELEXA) 40 MG tablet Take 1 tablet (40 mg total) by mouth daily.  30 tablet  2  . clonazePAM (KLONOPIN) 0.5 MG tablet Take 1 to 1 1/2 tablets by mouth twice daily  45 tablet  2  . fluticasone (FLONASE) 50 MCG/ACT nasal spray Place 2 sprays into the nose daily as needed.      . hydrochlorothiazide (HYDRODIURIL) 25 MG tablet Take 1 tablet (25 mg total) by mouth daily.  30 tablet  5  . levalbuterol (XOPENEX HFA) 45 MCG/ACT inhaler Inhale 1-2 puffs into the lungs every 6 (six) hours as needed.      Marland Kitchen omeprazole (PRILOSEC) 20 MG capsule Take 1 capsule (20 mg total) by mouth 2 (two) times daily.  60 capsule  5  . Probiotic Product (PROBIOTIC DAILY PO) Take by mouth.      . ranitidine (ZANTAC) 150 MG tablet Take 150 mg by mouth at bedtime.      Marland Kitchen tiotropium (SPIRIVA) 18 MCG inhalation capsule Place 1 capsule (18 mcg total) into inhaler and inhale daily.  30 capsule  5   Current Facility-Administered Medications on File Prior to Visit  Medication Dose Route Frequency  Provider Last Rate Last Dose  . albuterol (PROVENTIL) (2.5 MG/3ML) 0.083% nebulizer solution 2.5 mg  2.5 mg Nebulization Once Alisa Graff, MD        Review of Systems Patient denies any lightheadedness or dizziness.  No significant sinus symptoms.   No chest pain, tightness or palpitations.  No increased shortness of breath, cough or congestion.  Breathing stable.  No nausea or vomiting.  No acid reflux.  No bowel change.  Probiotic is helping.  No constipation, BRBPR or melana.  No urine change.  Taking prilosec and zantac regularly.  Feels better.       Objective:   Physical Exam  Filed Vitals:   06/15/13 1037  BP: 110/70  Pulse: 81  Temp: 98.5 F (36.9 C)  Resp: 39   68 year old female in no acute distress.   HEENT:  Nares- clear.  Oropharynx - without lesions. NECK:  Supple.  Nontender.  No audible bruit.  HEART:  Appears to be regular. LUNGS:  No crackles or wheezing audible.  Respirations even and unlabored.  RADIAL PULSE:  Equal bilaterally.    BREASTS:  No nipple discharge or nipple retraction present.  Could not appreciate any distinct nodules or axillary adenopathy.  ABDOMEN:  Soft, nontender.  Bowel sounds present and normal.  No audible abdominal bruit.  GU:  Not performed.     EXTREMITIES:  No increased edema present.  DP pulses palpable and equal bilaterally.           Assessment & Plan:  CARDIOVASCULAR.  Currently asymptomatic.     HEALTH MAINTENANCE.  Physical today.   She is s/p hysterectomy.  Colonoscopy 02/09/10 revealed rectal polyp and internal hemorrhoids.  Mammogram 05/30/12 - Birads I.  Schedule a f/u mammogram.    I spent 25 minutes with the patient and more than 50% of the time was spent in consultation regarding the above.

## 2013-06-18 NOTE — Assessment & Plan Note (Signed)
Persistent left arm lesion.  Refer to Dr Phillip Heal.

## 2013-06-18 NOTE — Assessment & Plan Note (Signed)
Blood pressure doing well.  Same med regimen.  Follow metabolic panel.    

## 2013-06-18 NOTE — Assessment & Plan Note (Signed)
Colonoscopy 02/09/10.   

## 2013-06-18 NOTE — Assessment & Plan Note (Signed)
Sugar slightly increased.  Follow fasting glucose and a1c.

## 2013-06-18 NOTE — Assessment & Plan Note (Signed)
On omeprazole and zantac as outlined.  GI w/u as outlined.  Currently doing well.  Follow.

## 2013-06-18 NOTE — Assessment & Plan Note (Signed)
Resolved

## 2013-06-19 ENCOUNTER — Other Ambulatory Visit: Payer: Self-pay | Admitting: Internal Medicine

## 2013-06-19 ENCOUNTER — Encounter: Payer: Self-pay | Admitting: Internal Medicine

## 2013-06-19 ENCOUNTER — Other Ambulatory Visit: Payer: Self-pay | Admitting: *Deleted

## 2013-06-19 DIAGNOSIS — R7989 Other specified abnormal findings of blood chemistry: Secondary | ICD-10-CM

## 2013-06-19 MED ORDER — PRAVASTATIN SODIUM 10 MG PO TABS: 10.0000 mg | ORAL_TABLET | Freq: Every day | ORAL | Status: DC

## 2013-06-19 NOTE — Progress Notes (Signed)
Order placed for f/u TFTs.   

## 2013-06-23 ENCOUNTER — Ambulatory Visit: Payer: Self-pay | Admitting: Internal Medicine

## 2013-06-23 LAB — HM MAMMOGRAPHY: HM Mammogram: NEGATIVE

## 2013-06-26 ENCOUNTER — Other Ambulatory Visit (INDEPENDENT_AMBULATORY_CARE_PROVIDER_SITE_OTHER): Payer: Medicare PPO

## 2013-06-26 DIAGNOSIS — R7309 Other abnormal glucose: Secondary | ICD-10-CM

## 2013-06-26 DIAGNOSIS — R739 Hyperglycemia, unspecified: Secondary | ICD-10-CM

## 2013-06-26 DIAGNOSIS — R946 Abnormal results of thyroid function studies: Secondary | ICD-10-CM

## 2013-06-26 DIAGNOSIS — R7989 Other specified abnormal findings of blood chemistry: Secondary | ICD-10-CM

## 2013-06-26 LAB — T4, FREE: Free T4: 0.69 ng/dL (ref 0.60–1.60)

## 2013-06-26 LAB — TSH: TSH: 0.64 u[IU]/mL (ref 0.35–4.50)

## 2013-06-26 LAB — HEMOGLOBIN A1C: HEMOGLOBIN A1C: 5.8 % (ref 4.6–6.5)

## 2013-06-26 LAB — T3, FREE: T3, Free: 2.9 pg/mL (ref 2.3–4.2)

## 2013-06-27 ENCOUNTER — Encounter: Payer: Self-pay | Admitting: Internal Medicine

## 2013-06-27 ENCOUNTER — Encounter: Payer: Self-pay | Admitting: *Deleted

## 2013-06-27 LAB — GLUCOSE, FASTING: Glucose, Fasting: 88 mg/dL (ref 70–99)

## 2013-07-19 ENCOUNTER — Telehealth: Payer: Self-pay | Admitting: Internal Medicine

## 2013-07-19 NOTE — Telephone Encounter (Signed)
Needs f/u appt in 11/15 (30 minutes).  Please schedule.  Thanks.

## 2013-07-19 NOTE — Telephone Encounter (Signed)
The patient has been scheduled for 11.2.15 @ 11:00

## 2013-07-31 ENCOUNTER — Telehealth: Payer: Self-pay | Admitting: *Deleted

## 2013-07-31 ENCOUNTER — Other Ambulatory Visit (INDEPENDENT_AMBULATORY_CARE_PROVIDER_SITE_OTHER): Payer: Medicare PPO

## 2013-07-31 DIAGNOSIS — E7800 Pure hypercholesterolemia, unspecified: Secondary | ICD-10-CM

## 2013-07-31 NOTE — Telephone Encounter (Signed)
What labs and dx?  

## 2013-07-31 NOTE — Telephone Encounter (Signed)
Order placed for f/u liver panel.  Thanks.

## 2013-08-01 LAB — HEPATIC FUNCTION PANEL
ALT: 17 U/L (ref 0–35)
AST: 20 U/L (ref 0–37)
Albumin: 4 g/dL (ref 3.5–5.2)
Alkaline Phosphatase: 57 U/L (ref 39–117)
BILIRUBIN TOTAL: 0.5 mg/dL (ref 0.2–1.2)
Bilirubin, Direct: 0.1 mg/dL (ref 0.0–0.3)
Total Protein: 6.4 g/dL (ref 6.0–8.3)

## 2013-08-02 ENCOUNTER — Other Ambulatory Visit: Payer: Self-pay | Admitting: Internal Medicine

## 2013-08-02 ENCOUNTER — Encounter: Payer: Self-pay | Admitting: *Deleted

## 2013-08-02 DIAGNOSIS — E7800 Pure hypercholesterolemia, unspecified: Secondary | ICD-10-CM

## 2013-08-02 NOTE — Progress Notes (Signed)
Order placed for labs.

## 2013-08-06 ENCOUNTER — Telehealth: Payer: Self-pay | Admitting: Internal Medicine

## 2013-08-06 ENCOUNTER — Other Ambulatory Visit: Payer: Self-pay | Admitting: Internal Medicine

## 2013-08-06 NOTE — Progress Notes (Signed)
Opened in error

## 2013-08-06 NOTE — Telephone Encounter (Signed)
Received a request for refill on citalopram.  Pt is also on prilosec.  See if she would be agreeable to change to protonix (instead of prilosec).  There can be an interaction with higher doses of citalopram and prilosec.  If agreeable, then change prilosec to protonix 40mg  q day and ok to refill citalopram x 3.  Let me know if any problems.   Thanks.

## 2013-08-07 MED ORDER — CITALOPRAM HYDROBROMIDE 40 MG PO TABS: 40.0000 mg | ORAL_TABLET | Freq: Every day | ORAL | Status: DC

## 2013-08-07 MED ORDER — PANTOPRAZOLE SODIUM 40 MG PO TBEC: 40.0000 mg | DELAYED_RELEASE_TABLET | Freq: Every day | ORAL | Status: DC

## 2013-08-07 NOTE — Telephone Encounter (Signed)
Left message for pt to return my call.

## 2013-08-07 NOTE — Telephone Encounter (Signed)
Pt notified and agreeable. Rx sent to pharmacy by escript

## 2013-08-17 ENCOUNTER — Other Ambulatory Visit: Payer: Self-pay | Admitting: *Deleted

## 2013-08-17 ENCOUNTER — Telehealth: Payer: Self-pay | Admitting: Internal Medicine

## 2013-08-17 MED ORDER — PRAVASTATIN SODIUM 10 MG PO TABS: 10.0000 mg | ORAL_TABLET | Freq: Every day | ORAL | Status: DC

## 2013-08-17 NOTE — Telephone Encounter (Signed)
pravastatin (PRAVACHOL) 10 MG tablet

## 2013-08-17 NOTE — Telephone Encounter (Signed)
Refill sent.

## 2013-10-27 ENCOUNTER — Other Ambulatory Visit: Payer: Self-pay | Admitting: *Deleted

## 2013-10-27 MED ORDER — TIOTROPIUM BROMIDE MONOHYDRATE 18 MCG IN CAPS: 18.0000 ug | ORAL_CAPSULE | Freq: Every day | RESPIRATORY_TRACT | Status: DC

## 2013-11-07 ENCOUNTER — Other Ambulatory Visit: Payer: Self-pay | Admitting: *Deleted

## 2013-11-07 MED ORDER — HYDROCHLOROTHIAZIDE 25 MG PO TABS: 25.0000 mg | ORAL_TABLET | Freq: Every day | ORAL | Status: DC

## 2013-11-16 ENCOUNTER — Other Ambulatory Visit (INDEPENDENT_AMBULATORY_CARE_PROVIDER_SITE_OTHER): Payer: Medicare PPO

## 2013-11-16 DIAGNOSIS — E7800 Pure hypercholesterolemia, unspecified: Secondary | ICD-10-CM

## 2013-11-16 LAB — COMPREHENSIVE METABOLIC PANEL
ALT: 15 U/L (ref 0–35)
AST: 21 U/L (ref 0–37)
Albumin: 3.7 g/dL (ref 3.5–5.2)
Alkaline Phosphatase: 64 U/L (ref 39–117)
BUN: 13 mg/dL (ref 6–23)
CO2: 33 meq/L — AB (ref 19–32)
CREATININE: 0.7 mg/dL (ref 0.4–1.2)
Calcium: 9.5 mg/dL (ref 8.4–10.5)
Chloride: 98 mEq/L (ref 96–112)
GFR: 85.92 mL/min (ref >60.00)
Glucose, Bld: 99 mg/dL (ref 70–99)
Potassium: 3.6 mEq/L (ref 3.5–5.1)
Sodium: 136 mEq/L (ref 135–145)
Total Bilirubin: 0.7 mg/dL (ref 0.2–1.2)
Total Protein: 7.2 g/dL (ref 6.0–8.3)

## 2013-11-16 LAB — LIPID PANEL
CHOLESTEROL: 180 mg/dL (ref 0–200)
HDL: 56.3 mg/dL (ref >39.00)
LDL Cholesterol: 86 mg/dL (ref 0–99)
NonHDL: 123.7
TRIGLYCERIDES: 191 mg/dL — AB (ref 0.0–149.0)
Total CHOL/HDL Ratio: 3
VLDL: 38.2 mg/dL (ref 0.0–40.0)

## 2013-11-20 ENCOUNTER — Ambulatory Visit (INDEPENDENT_AMBULATORY_CARE_PROVIDER_SITE_OTHER): Payer: Medicare PPO | Admitting: Internal Medicine

## 2013-11-20 VITALS — BP 110/80 | HR 76 | Temp 98.2°F | Ht 61.0 in | Wt 166.5 lb

## 2013-11-20 DIAGNOSIS — I10 Essential (primary) hypertension: Secondary | ICD-10-CM

## 2013-11-20 DIAGNOSIS — E7800 Pure hypercholesterolemia, unspecified: Secondary | ICD-10-CM

## 2013-11-20 DIAGNOSIS — J4520 Mild intermittent asthma, uncomplicated: Secondary | ICD-10-CM

## 2013-11-20 DIAGNOSIS — Z86010 Personal history of colonic polyps: Secondary | ICD-10-CM

## 2013-11-20 DIAGNOSIS — R739 Hyperglycemia, unspecified: Secondary | ICD-10-CM

## 2013-11-20 DIAGNOSIS — K219 Gastro-esophageal reflux disease without esophagitis: Secondary | ICD-10-CM

## 2013-11-20 MED ORDER — PRAVASTATIN SODIUM 10 MG PO TABS: 10.0000 mg | ORAL_TABLET | Freq: Every day | ORAL | Status: DC

## 2013-11-26 ENCOUNTER — Encounter: Payer: Self-pay | Admitting: Internal Medicine

## 2013-11-26 NOTE — Progress Notes (Signed)
Subjective:    [name], female    DOB: 1945/11/15, 68 y.o.   MRN: [id number]  HPI 68 year old female with past history of anxiety/depression and late onset asthma/COPD who comes in today for a scheduled follow up.  Breathing has been stable.  No increased cough and congestion.   No nausea or vomiting.  No bowel change.  Bowels better on probiotic.  No chest pain or tightness.      Past Medical History  Diagnosis Date  . Scoliosis   . Depression   . Hypertension   . Asthma   . COPD (chronic obstructive pulmonary disease)   . Ulcer   . Bronchitis   . Gastritis   . Arthritis   . Chronic headaches   . GERD (gastroesophageal reflux disease)     Current Outpatient Prescriptions on File Prior to Visit  Medication Sig Dispense Refill  . citalopram (CELEXA) 40 MG tablet Take 1 tablet (40 mg total) by mouth daily. 30 tablet 2  . clonazePAM (KLONOPIN) 0.5 MG tablet Take 1 to 1 1/2 tablets by mouth twice daily 45 tablet 2  . fluticasone (FLONASE) 50 MCG/ACT nasal spray Place 2 sprays into the nose daily as needed.    . hydrochlorothiazide (HYDRODIURIL) 25 MG tablet Take 1 tablet (25 mg total) by mouth daily. 30 tablet 5  . levalbuterol (XOPENEX HFA) 45 MCG/ACT inhaler Inhale 1-2 puffs into the lungs every 6 (six) hours as needed.    . pantoprazole (PROTONIX) 40 MG tablet Take 1 tablet (40 mg total) by mouth daily. 30 tablet 5  . Probiotic Product (PROBIOTIC DAILY PO) Take by mouth.    . ranitidine (ZANTAC) 150 MG tablet Take 150 mg by mouth at bedtime.    Marland Kitchen tiotropium (SPIRIVA) 18 MCG inhalation capsule Place 1 capsule (18 mcg total) into inhaler and inhale daily. 30 capsule 5   Current Facility-Administered Medications on File Prior to Visit  Medication Dose Route Frequency Provider Last Rate Last Dose  . albuterol (PROVENTIL) (2.5 MG/3ML) 0.083% nebulizer solution 2.5 mg  2.5 mg Nebulization Once [name], MD        Review of Systems Patient denies any  lightheadedness or dizziness.  No sinus symptoms.   No chest pain, tightness or palpitations.  No increased shortness of breath, cough or congestion.  Breathing stable.  No nausea or vomiting.  No acid reflux.  No bowel change.  Probiotic is helping.  No constipation, BRBPR or melana.  No urine change.  Taking prilosec and zantac regularly.  Feels better.       Objective:   Physical Exam  Filed Vitals:   11/20/13 1110  BP: 110/80  Pulse: 76  Temp: 98.2 F (32.53 C)   68 year old female in no acute distress.   HEENT:  Nares- clear.  Oropharynx - without lesions. NECK:  Supple.  Nontender.  No audible bruit.  HEART:  Appears to be regular. LUNGS:  No crackles or wheezing audible.  Respirations even and unlabored.  RADIAL PULSE:  Equal bilaterally.  ABDOMEN:  Soft, nontender.  Bowel sounds present and normal.  No audible abdominal bruit.    EXTREMITIES:  No increased edema present.  DP pulses palpable and equal bilaterally.           Assessment & Plan:  CARDIOVASCULAR.  Currently asymptomatic.   Essential hypertension, benign Blood pressure doing well.  Same medication regimen.  - Basic metabolic panel; Future  Asthma, mild intermittent, uncomplicated Breathing  stable.  Doing well.   Gastroesophageal reflux disease, esophagitis presence not specified Controlled on protonix.  Follow.    History of colonic polyps Colonoscopy 02/09/10 as outlined.  States due f/u colonoscopy.  Scheduled to see Dawson Bills 12/20/13.   Hyperglycemia A1c wnl.  Follow.  Low carb diet.    Hypercholesterolemia ;pw cholesterol diet.  Continue pravastatin.  Lab Results  Component Value Date   CHOL 180 11/16/2013   HDL 56.30 11/16/2013   LDLCALC 86 11/16/2013   LDLDIRECT 148.4 11/03/2012   TRIG 191.0* 11/16/2013   CHOLHDL 3 11/16/2013   - Lipid panel; Future - Hepatic function panel; Future    HEALTH MAINTENANCE.  Physical 06/15/13.   She is s/p hysterectomy.  Colonoscopy 02/09/10 revealed rectal  polyp and internal hemorrhoids.  States due.  Has f/u scheduled with Dawson Bills 12/20/13.  Mammogram 06/23/13 - [facility] I.

## 2013-12-04 ENCOUNTER — Other Ambulatory Visit: Payer: Self-pay | Admitting: *Deleted

## 2013-12-04 MED ORDER — CITALOPRAM HYDROBROMIDE 40 MG PO TABS: 40.0000 mg | ORAL_TABLET | Freq: Every day | ORAL | Status: DC

## 2014-01-05 ENCOUNTER — Other Ambulatory Visit: Payer: Self-pay | Admitting: *Deleted

## 2014-01-05 MED ORDER — CLONAZEPAM 0.5 MG PO TABS: ORAL_TABLET | ORAL | Status: DC

## 2014-01-05 NOTE — Telephone Encounter (Signed)
Last visit 01/30/13

## 2014-01-05 NOTE — Telephone Encounter (Signed)
Faxed to pharmacy

## 2014-01-05 NOTE — Telephone Encounter (Signed)
Refilled clonazepam #45 with 2 refills.  rx signed and on your desk.

## 2014-02-06 DIAGNOSIS — D369 Benign neoplasm, unspecified site: Secondary | ICD-10-CM | POA: Insufficient documentation

## 2014-02-19 ENCOUNTER — Ambulatory Visit: Payer: Self-pay | Admitting: Unknown Physician Specialty

## 2014-03-07 ENCOUNTER — Telehealth: Payer: Self-pay | Admitting: *Deleted

## 2014-03-07 MED ORDER — CITALOPRAM HYDROBROMIDE 40 MG PO TABS: 40.0000 mg | ORAL_TABLET | Freq: Every day | ORAL | Status: DC

## 2014-03-07 NOTE — Telephone Encounter (Signed)
rx sent in for citalopram 40mg #30 with 2 refills.   

## 2014-03-07 NOTE — Telephone Encounter (Signed)
Fax from pharmacy requesting Citalopram HBR 40mg .  Pt last OV 11.2.15.  Please advise refill

## 2014-03-13 ENCOUNTER — Telehealth: Payer: Self-pay | Admitting: *Deleted

## 2014-03-13 DIAGNOSIS — R109 Unspecified abdominal pain: Secondary | ICD-10-CM

## 2014-03-13 NOTE — Telephone Encounter (Signed)
Saw Dr. Vira Agar for stomach issues. 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 Still c/o stomach pain. Would like a referral to see Dr. Jamal Ma[facility] Dr. Nicki Reaper sent her there in 2003 or 2004.

## 2014-03-14 NOTE — Telephone Encounter (Signed)
Order placed for referral to gastroenterology (pt requested second opinion).

## 2014-03-14 NOTE — Telephone Encounter (Signed)
Pt called again states she requested the wrong MDs name, she would like to be referred to Dr Brandon Melnick at [phone] not Dr Alycia Rossetti.

## 2014-03-16 ENCOUNTER — Other Ambulatory Visit: Payer: Self-pay | Admitting: *Deleted

## 2014-03-16 MED ORDER — PRAVASTATIN SODIUM 10 MG PO TABS: 10.0000 mg | ORAL_TABLET | Freq: Every day | ORAL | Status: DC

## 2014-03-30 ENCOUNTER — Other Ambulatory Visit: Payer: Self-pay | Admitting: *Deleted

## 2014-03-30 MED ORDER — PANTOPRAZOLE SODIUM 40 MG PO TBEC
40.0000 mg | DELAYED_RELEASE_TABLET | Freq: Every day | ORAL | Status: DC
Start: 2014-03-30 — End: 2014-10-31

## 2014-05-14 LAB — SURGICAL PATHOLOGY

## 2014-05-16 ENCOUNTER — Other Ambulatory Visit: Payer: Self-pay | Admitting: *Deleted

## 2014-05-16 MED ORDER — TIOTROPIUM BROMIDE MONOHYDRATE 18 MCG IN CAPS: 18.0000 ug | ORAL_CAPSULE | Freq: Every day | RESPIRATORY_TRACT | Status: DC

## 2014-05-18 ENCOUNTER — Other Ambulatory Visit (INDEPENDENT_AMBULATORY_CARE_PROVIDER_SITE_OTHER): Payer: Medicare PPO

## 2014-05-18 DIAGNOSIS — E7800 Pure hypercholesterolemia, unspecified: Secondary | ICD-10-CM

## 2014-05-18 DIAGNOSIS — I10 Essential (primary) hypertension: Secondary | ICD-10-CM

## 2014-05-18 LAB — LIPID PANEL
Cholesterol: 176 mg/dL (ref 0–200)
HDL: 59 mg/dL (ref >39.00)
LDL CALC: 85 mg/dL (ref 0–99)
NonHDL: 117
Total CHOL/HDL Ratio: 3
Triglycerides: 159 mg/dL — ABNORMAL HIGH (ref 0.0–149.0)
VLDL: 31.8 mg/dL (ref 0.0–40.0)

## 2014-05-18 LAB — HEPATIC FUNCTION PANEL
ALK PHOS: 60 U/L (ref 39–117)
ALT: 19 U/L (ref 0–35)
AST: 23 U/L (ref 0–37)
Albumin: 4 g/dL (ref 3.5–5.2)
BILIRUBIN TOTAL: 0.4 mg/dL (ref 0.2–1.2)
Bilirubin, Direct: 0.1 mg/dL (ref 0.0–0.3)
Total Protein: 6.6 g/dL (ref 6.0–8.3)

## 2014-05-18 LAB — BASIC METABOLIC PANEL
BUN: 12 mg/dL (ref 6–23)
CO2: 32 meq/L (ref 19–32)
Calcium: 9.4 mg/dL (ref 8.4–10.5)
Chloride: 100 mEq/L (ref 96–112)
Creatinine, Ser: 0.65 mg/dL (ref 0.40–1.20)
GFR: 96.54 mL/min (ref >60.00)
GLUCOSE: 103 mg/dL — AB (ref 70–99)
Potassium: 3.8 mEq/L (ref 3.5–5.1)
SODIUM: 138 meq/L (ref 135–145)

## 2014-05-21 ENCOUNTER — Encounter: Payer: Medicare PPO | Admitting: Internal Medicine

## 2014-05-22 ENCOUNTER — Telehealth: Payer: Self-pay | Admitting: Internal Medicine

## 2014-05-22 ENCOUNTER — Ambulatory Visit (INDEPENDENT_AMBULATORY_CARE_PROVIDER_SITE_OTHER): Payer: Medicare PPO | Admitting: Internal Medicine

## 2014-05-22 ENCOUNTER — Encounter: Payer: Self-pay | Admitting: Internal Medicine

## 2014-05-22 VITALS — BP 108/73 | HR 86 | Temp 98.4°F | Ht 61.0 in | Wt 165.0 lb

## 2014-05-22 DIAGNOSIS — J069 Acute upper respiratory infection, unspecified: Secondary | ICD-10-CM | POA: Diagnosis not present

## 2014-05-22 MED ORDER — CEFDINIR 300 MG PO CAPS: 300.0000 mg | ORAL_CAPSULE | Freq: Two times a day (BID) | ORAL | Status: DC

## 2014-05-22 MED ORDER — PREDNISONE 10 MG PO TABS: ORAL_TABLET | ORAL | Status: DC

## 2014-05-22 NOTE — Progress Notes (Signed)
Pre visit review using our clinic review tool, if applicable. No additional management support is needed unless otherwise documented below in the visit note. 

## 2014-05-22 NOTE — Telephone Encounter (Signed)
Jennifer [name] has openings today, please advise

## 2014-05-22 NOTE — Telephone Encounter (Signed)
The patient has been notified and scheduled at 12:00.

## 2014-05-22 NOTE — Telephone Encounter (Signed)
See if she can come in at 12:00 today. Work in for this.

## 2014-05-22 NOTE — Telephone Encounter (Signed)
Congested cough needing to see Dr. Nicki Reaper.

## 2014-05-25 ENCOUNTER — Encounter: Payer: Self-pay | Admitting: Internal Medicine

## 2014-05-25 ENCOUNTER — Ambulatory Visit (INDEPENDENT_AMBULATORY_CARE_PROVIDER_SITE_OTHER): Payer: Medicare PPO | Admitting: Internal Medicine

## 2014-05-25 VITALS — BP 120/78 | HR 78 | Temp 98.0°F | Ht 61.0 in | Wt 165.1 lb

## 2014-05-25 DIAGNOSIS — J4520 Mild intermittent asthma, uncomplicated: Secondary | ICD-10-CM | POA: Diagnosis not present

## 2014-05-25 DIAGNOSIS — J069 Acute upper respiratory infection, unspecified: Secondary | ICD-10-CM | POA: Diagnosis not present

## 2014-05-25 DIAGNOSIS — R739 Hyperglycemia, unspecified: Secondary | ICD-10-CM

## 2014-05-25 DIAGNOSIS — K2100 Gastro-esophageal reflux disease with esophagitis, without bleeding: Secondary | ICD-10-CM

## 2014-05-25 DIAGNOSIS — Z1239 Encounter for other screening for malignant neoplasm of breast: Secondary | ICD-10-CM

## 2014-05-25 DIAGNOSIS — Z0000 Encounter for general adult medical examination without abnormal findings: Secondary | ICD-10-CM

## 2014-05-25 DIAGNOSIS — Z860100 Personal history of colon polyps, unspecified: Secondary | ICD-10-CM

## 2014-05-25 DIAGNOSIS — I10 Essential (primary) hypertension: Secondary | ICD-10-CM | POA: Diagnosis not present

## 2014-05-25 DIAGNOSIS — E7800 Pure hypercholesterolemia, unspecified: Secondary | ICD-10-CM

## 2014-05-25 NOTE — Progress Notes (Signed)
[name], female   DOB: [birth year], 69 y.o.   MRN: [id number]   Subjective:    [name], female    DOB: Oct 16, 1945, 69 y.o.   MRN: [id number]  HPI  Patient here to follow up on her current issues as well as for physical exam.  Just evaluated for cough and congestion.  Placed on abx and steroids.  Feels better.  Still with some cough and congestion.  Not as tight.  Taking her medication as prescribed.  Tries to stay active.  No cardiac symptoms with increased activity or exertion.  Eating and drinking well.  Bowels stable.  Had f/u with Dr Tiffany Kocher in 08/2014.  Planning for f/u EGD.  Has occasional pm acid reflux.     Past Medical History  Diagnosis Date  . Scoliosis   . Depression   . Hypertension   . Asthma   . COPD (chronic obstructive pulmonary disease)   . Ulcer   . Bronchitis   . Gastritis   . Arthritis   . Chronic headaches   . GERD (gastroesophageal reflux disease)     Current Outpatient Prescriptions on File Prior to Visit  Medication Sig Dispense Refill  . cefdinir (OMNICEF) 300 MG capsule Take 1 capsule (300 mg total) by mouth 2 (two) times daily. 20 capsule 0  . citalopram (CELEXA) 40 MG tablet Take 1 tablet (40 mg total) by mouth daily. 30 tablet 2  . clonazePAM (KLONOPIN) 0.5 MG tablet Take 1 to 1 1/2 tablets by mouth twice daily 45 tablet 2  . fluticasone (FLONASE) 50 MCG/ACT nasal spray Place 2 sprays into the nose daily as needed.    . hydrochlorothiazide (HYDRODIURIL) 25 MG tablet Take 1 tablet (25 mg total) by mouth daily. 30 tablet 5  . levalbuterol (XOPENEX HFA) 45 MCG/ACT inhaler Inhale 1-2 puffs into the lungs every 6 (six) hours as needed.    . pantoprazole (PROTONIX) 40 MG tablet Take 1 tablet (40 mg total) by mouth daily. 30 tablet 5  . pravastatin (PRAVACHOL) 10 MG tablet Take 1 tablet (10 mg total) by mouth daily. 30 tablet 3  . predniSONE (DELTASONE) 10 MG tablet Take 6 tablets x 1 day and then decrease by 1/2 tablet per day  until down to zero mg. 39 tablet 0  . Probiotic Product (PROBIOTIC DAILY PO) Take by mouth.    . ranitidine (ZANTAC) 150 MG tablet Take 150 mg by mouth at bedtime.    Marland Kitchen tiotropium (SPIRIVA) 18 MCG inhalation capsule Place 1 capsule (18 mcg total) into inhaler and inhale daily. 30 capsule 5   Current Facility-Administered Medications on File Prior to Visit  Medication Dose Route Frequency Provider Last Rate Last Dose  . albuterol (PROVENTIL) (2.5 MG/3ML) 0.083% nebulizer solution 2.5 mg  2.5 mg Nebulization Once [name], MD        Review of Systems  Constitutional: Negative for appetite change and unexpected weight change.  HENT: Positive for congestion. Negative for sinus pressure.   Eyes: Negative for pain and visual disturbance.  Respiratory: Positive for cough and wheezing. Negative for chest tightness.   Cardiovascular: Negative for chest pain, palpitations and leg swelling.  Gastrointestinal: Negative for nausea, vomiting, abdominal pain and diarrhea.  Genitourinary: Negative for frequency and difficulty urinating.  Musculoskeletal: Negative for joint swelling and gait problem.  Skin: Negative for color change and rash.  Neurological: Negative for dizziness, light-headedness and headaches.  Hematological: Negative for adenopathy. Does not bruise/bleed easily.  Psychiatric/Behavioral: Negative for dysphoric mood and agitation.       Objective:    Physical Exam  Constitutional: She is oriented to person, place, and time. She appears well-developed and well-nourished.  HENT:  Nose: Nose normal.  Mouth/Throat: Oropharynx is clear and moist.  Eyes: Right eye exhibits no discharge. Left eye exhibits no discharge. No scleral icterus.  Neck: Neck supple. No thyromegaly present.  Cardiovascular: Normal rate and regular rhythm.   Pulmonary/Chest: Breath sounds normal. No accessory muscle usage. No tachypnea. No respiratory distress. She has no decreased breath sounds. She has no  wheezes. She has no rhonchi. Right breast exhibits no inverted nipple, no mass, no nipple discharge and no tenderness (no axillary adenopathy). Left breast exhibits no inverted nipple, no mass, no nipple discharge and no tenderness (no axilarry adenopathy).  Abdominal: Soft. Bowel sounds are normal. There is no tenderness.  Musculoskeletal: She exhibits no edema or tenderness.  Lymphadenopathy:    She has no cervical adenopathy.  Neurological: She is alert and oriented to person, place, and time.  Skin: Skin is warm. No rash noted.  Psychiatric: She has a normal mood and affect. Her behavior is normal.    BP 120/78 mmHg  Pulse 78  Temp(Src) 98 F (36.7 C) (Oral)  Ht 5' 1"  (1.549 m)  Wt 165 lb 2 oz (74.9 kg)  BMI 31.22 kg/m2  SpO2 94% Wt Readings from Last 3 Encounters:  05/25/14 165 lb 2 oz (74.9 kg)  05/22/14 165 lb (74.844 kg)  11/20/13 166 lb 8 oz (75.524 kg)     Lab Results  Component Value Date   WBC 7.0 06/15/2013   HGB 13.9 06/15/2013   HCT 41.1 06/15/2013   PLT 232.0 06/15/2013   GLUCOSE 103* 05/18/2014   CHOL 176 05/18/2014   TRIG 159.0* 05/18/2014   HDL 59.00 05/18/2014   LDLDIRECT 148.4 11/03/2012   LDLCALC 85 05/18/2014   ALT 19 05/18/2014   AST 23 05/18/2014   NA 138 05/18/2014   K 3.8 05/18/2014   CL 100 05/18/2014   CREATININE 0.65 05/18/2014   BUN 12 05/18/2014   CO2 32 05/18/2014   TSH 0.64 06/26/2013   HGBA1C 5.8 06/26/2013       Assessment & Plan:   Problem List Items Addressed This Visit    Asthma    Currently being treated for infection and recent flare.  Doing better. Follow.       Relevant Orders   CBC with Differential/Platelet   Essential hypertension, benign    Blood pressure doing well.  Follow pressures.        Relevant Orders   Basic metabolic panel   GERD (gastroesophageal reflux disease)    EGD as outlined.  Continue protonix and zantac.  Planning for f/u EGD in 08/2014 (and f/u with Dr Tiffany Kocher).        Health care  maintenance    Physical 05/25/14.  Colonoscopy 02/09/10.  Mammogram 06/23/13 - BiradsI.  Scheduled f/u mammogram.        History of colonic polyps    Colonoscopy 02/09/10.        Hypercholesterolemia    On pravastatin.  Low cholesterol diet and exercise.  Follow lipid panel and liver function tests.       Relevant Orders   Lipid panel   Hepatic function panel   Hyperglycemia    Low carb diet and exercise.  Follow met b and a1c.        Relevant Orders  TSH   Hemoglobin A1c   URI (upper respiratory infection)    Continue abx and prednisone taper as instructed.  Symptoms have improved.  Follow.        Other Visit Diagnoses    Breast cancer screening    -  Primary    Relevant Orders    MM DIGITAL SCREENING BILATERAL        [name], MD

## 2014-05-25 NOTE — Progress Notes (Signed)
Pre visit review using our clinic review tool, if applicable. No additional management support is needed unless otherwise documented below in the visit note. 

## 2014-05-26 ENCOUNTER — Encounter: Payer: Self-pay | Admitting: Internal Medicine

## 2014-05-26 NOTE — Assessment & Plan Note (Signed)
Cough and congestion as outlined.  Symptoms as outlined.  Treat with omnicef as directed.  Saline nasal spray and nasacort as directed.  mucinex DM in the am and robiutssin DM in the evening.  Prednisone taper as directed.  Rest.  Fluids.  Follow.

## 2014-05-26 NOTE — Progress Notes (Signed)
[name], female   DOB: [birth year], 69 y.o.   MRN: [id number]   Subjective:    [name], female    DOB: [birth year], 69 y.o.   MRN: [id number]  HPI  Patient here as a work in with concerns regarding increased cough and congestion.  Symptoms started over the last 48 hours.  Increased cough.  Up all night.  Throat hurts.  No sinus pressure.  Minimal wheezing.  No fever.  No vomiting.  Mucus - colored.     Past Medical History  Diagnosis Date  . Scoliosis   . Depression   . Hypertension   . Asthma   . COPD (chronic obstructive pulmonary disease)   . Ulcer   . Bronchitis   . Gastritis   . Arthritis   . Chronic headaches   . GERD (gastroesophageal reflux disease)     Current Outpatient Prescriptions on File Prior to Visit  Medication Sig Dispense Refill  . citalopram (CELEXA) 40 MG tablet Take 1 tablet (40 mg total) by mouth daily. 30 tablet 2  . clonazePAM (KLONOPIN) 0.5 MG tablet Take 1 to 1 1/2 tablets by mouth twice daily 45 tablet 2  . fluticasone (FLONASE) 50 MCG/ACT nasal spray Place 2 sprays into the nose daily as needed.    . hydrochlorothiazide (HYDRODIURIL) 25 MG tablet Take 1 tablet (25 mg total) by mouth daily. 30 tablet 5  . levalbuterol (XOPENEX HFA) 45 MCG/ACT inhaler Inhale 1-2 puffs into the lungs every 6 (six) hours as needed.    . pantoprazole (PROTONIX) 40 MG tablet Take 1 tablet (40 mg total) by mouth daily. 30 tablet 5  . pravastatin (PRAVACHOL) 10 MG tablet Take 1 tablet (10 mg total) by mouth daily. 30 tablet 3  . Probiotic Product (PROBIOTIC DAILY PO) Take by mouth.    . ranitidine (ZANTAC) 150 MG tablet Take 150 mg by mouth at bedtime.    Marland Kitchen tiotropium (SPIRIVA) 18 MCG inhalation capsule Place 1 capsule (18 mcg total) into inhaler and inhale daily. 30 capsule 5   Current Facility-Administered Medications on File Prior to Visit  Medication Dose Route Frequency Provider Last Rate Last Dose  . albuterol (PROVENTIL) (2.5 MG/3ML)  0.083% nebulizer solution 2.5 mg  2.5 mg Nebulization Once [name], MD        Review of Systems  Constitutional: Negative for fever and appetite change.  HENT: Positive for congestion, postnasal drip and sore throat. Negative for sinus pressure.   Respiratory: Positive for cough. Negative for chest tightness and shortness of breath.   Cardiovascular: Negative for chest pain and palpitations.  Gastrointestinal: Negative for nausea, vomiting and diarrhea.  Neurological: Negative for dizziness, light-headedness and headaches.       Objective:    Physical Exam  HENT:  Nose: Nose normal.  Mouth/Throat: Oropharynx is clear and moist.  Neck: Neck supple.  Cardiovascular: Normal rate and regular rhythm.   Pulmonary/Chest: Breath sounds normal. No respiratory distress. She has no wheezes.  Increased cough with forced expiration.   Lymphadenopathy:    She has no cervical adenopathy.    BP 108/73 mmHg  Pulse 86  Temp(Src) 98.4 F (36.9 C) (Oral)  Ht 5\' 1"  (1.549 m)  Wt 165 lb (74.844 kg)  BMI 31.19 kg/m2  SpO2 95% Wt Readings from Last 3 Encounters:  05/25/14 165 lb 2 oz (74.9 kg)  05/22/14 165 lb (74.844 kg)  11/20/13 166 lb 8 oz (75.524 kg)     Lab  Results  Component Value Date   WBC 7.0 06/15/2013   HGB 13.9 06/15/2013   HCT 41.1 06/15/2013   PLT 232.0 06/15/2013   GLUCOSE 103* 05/18/2014   CHOL 176 05/18/2014   TRIG 159.0* 05/18/2014   HDL 59.00 05/18/2014   LDLDIRECT 148.4 11/03/2012   LDLCALC 85 05/18/2014   ALT 19 05/18/2014   AST 23 05/18/2014   NA 138 05/18/2014   K 3.8 05/18/2014   CL 100 05/18/2014   CREATININE 0.65 05/18/2014   BUN 12 05/18/2014   CO2 32 05/18/2014   TSH 0.64 06/26/2013   HGBA1C 5.8 06/26/2013       Assessment & Plan:   Problem List Items Addressed This Visit    URI (upper respiratory infection) - Primary    Cough and congestion as outlined.  Symptoms as outlined.  Treat with omnicef as directed.  Saline nasal spray and  nasacort as directed.  mucinex DM in the am and robiutssin DM in the evening.  Prednisone taper as directed.  Rest.  Fluids.  Follow.        Relevant Medications   cefdinir (OMNICEF) 300 MG capsule       [name], MD

## 2014-06-03 ENCOUNTER — Encounter: Payer: Self-pay | Admitting: Internal Medicine

## 2014-06-03 DIAGNOSIS — Z0000 Encounter for general adult medical examination without abnormal findings: Secondary | ICD-10-CM | POA: Insufficient documentation

## 2014-06-03 NOTE — Assessment & Plan Note (Signed)
Blood pressure doing well.  Follow pressures.

## 2014-06-03 NOTE — Assessment & Plan Note (Signed)
Continue abx and prednisone taper as instructed.  Symptoms have improved.  Follow.

## 2014-06-03 NOTE — Assessment & Plan Note (Signed)
On pravastatin.  Low cholesterol diet and exercise.  Follow lipid panel and liver function tests.   

## 2014-06-03 NOTE — Assessment & Plan Note (Signed)
Currently being treated for infection and recent flare.  Doing better. Follow.

## 2014-06-03 NOTE — Assessment & Plan Note (Signed)
Low carb diet and exercise.  Follow met b and a1c.   

## 2014-06-03 NOTE — Assessment & Plan Note (Signed)
EGD as outlined.  Continue protonix and zantac.  Planning for f/u EGD in 08/2014 (and f/u with Dr Tiffany Kocher).

## 2014-06-03 NOTE — Assessment & Plan Note (Signed)
Colonoscopy 02/09/10.

## 2014-06-03 NOTE — Assessment & Plan Note (Signed)
Physical 05/25/14.  Colonoscopy 02/09/10.  Mammogram 06/23/13 - BiradsI.  Scheduled f/u mammogram.

## 2014-06-06 ENCOUNTER — Other Ambulatory Visit: Payer: Self-pay | Admitting: Internal Medicine

## 2014-06-06 ENCOUNTER — Telehealth: Payer: Self-pay

## 2014-06-06 MED ORDER — HYDROCHLOROTHIAZIDE 25 MG PO TABS: 25.0000 mg | ORAL_TABLET | Freq: Every day | ORAL | Status: DC

## 2014-06-06 NOTE — Telephone Encounter (Signed)
Refill request from Pharmacy, for Hydrochlorothiazide 25mg  received.  Patient had a OV 11/20/13, did see you for acute issues on 05/22/14.  Please advise refill?

## 2014-06-06 NOTE — Telephone Encounter (Signed)
I saw her as an acute visit on 05/22/14 and physical on 05/25/14.  I have sent in refill for her hctz (#30 with 5 refills).

## 2014-06-06 NOTE — Progress Notes (Signed)
Refilled hctz 25mg  #30 with 5 refills.

## 2014-07-02 ENCOUNTER — Other Ambulatory Visit: Payer: Self-pay | Admitting: Internal Medicine

## 2014-07-02 ENCOUNTER — Ambulatory Visit
Admission: RE | Admit: 2014-07-02 | Discharge: 2014-07-02 | Disposition: A | Discharge status: Home / Self Care | Payer: BC Managed Care – PPO | Source: Ambulatory Visit | Attending: Internal Medicine | Admitting: Internal Medicine

## 2014-07-02 DIAGNOSIS — Z1239 Encounter for other screening for malignant neoplasm of breast: Secondary | ICD-10-CM

## 2014-07-02 DIAGNOSIS — Z1231 Encounter for screening mammogram for malignant neoplasm of breast: Secondary | ICD-10-CM | POA: Insufficient documentation

## 2014-07-09 ENCOUNTER — Telehealth: Payer: Self-pay | Admitting: Internal Medicine

## 2014-07-09 NOTE — Telephone Encounter (Signed)
Left message for pt to return my call. No mention of shoulder problems in chart.

## 2014-07-09 NOTE — Telephone Encounter (Deleted)
6/20 LM/bn

## 2014-07-09 NOTE — Telephone Encounter (Signed)
Pt called to request a CT Scan of right shoulder. Pt advised that the MD she was referred to a [facility] didn't do anything. Please advise pt/msn

## 2014-07-09 NOTE — Telephone Encounter (Signed)
Agree with reevaluation if now having shoulder pain.  Since having pain, would need to evaluate to know what test would be best to order.

## 2014-07-09 NOTE — Telephone Encounter (Signed)
Spoke to pt, states she has spoken to Dr. Nicki Reaper about this before. States she has a knot on her right collarbone that has been there "for years". States she was referred to someone at [facility] a few years ago and nothing was done because it wasn't causing her any pain at that time. States she has started to have some pain in her right shoulder that she thinks is from this knot. Denies any redness or swelling where the knot is, but just having shoulder pain. Advised she might need appt for evaluation, states she talked to Dr. Nicki Reaper about this at her appointment one month ago. Please advise.

## 2014-07-10 NOTE — Telephone Encounter (Signed)
Pt notified, Appt scheduled 07/16/14

## 2014-07-12 ENCOUNTER — Other Ambulatory Visit: Payer: Self-pay | Admitting: *Deleted

## 2014-07-12 MED ORDER — CITALOPRAM HYDROBROMIDE 40 MG PO TABS: 40.0000 mg | ORAL_TABLET | Freq: Every day | ORAL | Status: DC

## 2014-07-16 ENCOUNTER — Ambulatory Visit
Admission: RE | Admit: 2014-07-16 | Discharge: 2014-07-16 | Disposition: A | Discharge status: Home / Self Care | Payer: Medicare PPO | Source: Ambulatory Visit | Attending: Internal Medicine | Admitting: Internal Medicine

## 2014-07-16 ENCOUNTER — Encounter: Payer: Self-pay | Admitting: Internal Medicine

## 2014-07-16 ENCOUNTER — Ambulatory Visit (INDEPENDENT_AMBULATORY_CARE_PROVIDER_SITE_OTHER): Payer: Medicare PPO | Admitting: Internal Medicine

## 2014-07-16 VITALS — BP 100/60 | HR 79 | Temp 98.2°F | Ht 61.0 in | Wt 166.1 lb

## 2014-07-16 DIAGNOSIS — M25511 Pain in right shoulder: Secondary | ICD-10-CM

## 2014-07-16 NOTE — Progress Notes (Signed)
Pre visit review using our clinic review tool, if applicable. No additional management support is needed unless otherwise documented below in the visit note. 

## 2014-07-16 NOTE — Progress Notes (Signed)
[name], female   DOB: 06-Sep-1945, 69 y.o.   MRN: [id number]   Subjective:    [name], female    DOB: 12/22/1945, 69 y.o.   MRN: [id number]  HPI  Patient here as a work in with concerns regarding shoulder pain.  Has noticed increased pain recently.  Limited rom.  Pain localized right anterior shoulder.  Able to rotate head.  No significant pain - neck.  Hurts to raise her arm and pain reaching up to turn on ceiling fan.  No known injury.  Has scoliosis.     Past Medical History  Diagnosis Date  . Scoliosis   . Depression   . Hypertension   . Asthma   . COPD (chronic obstructive pulmonary disease)   . Ulcer   . Bronchitis   . Gastritis   . Arthritis   . Chronic headaches   . GERD (gastroesophageal reflux disease)     Current Outpatient Prescriptions on File Prior to Visit  Medication Sig Dispense Refill  . citalopram (CELEXA) 40 MG tablet Take 1 tablet (40 mg total) by mouth daily. 30 tablet 2  . clonazePAM (KLONOPIN) 0.5 MG tablet Take 1 to 1 1/2 tablets by mouth twice daily 45 tablet 2  . fluticasone (FLONASE) 50 MCG/ACT nasal spray Place 2 sprays into the nose daily as needed.    . hydrochlorothiazide (HYDRODIURIL) 25 MG tablet Take 1 tablet (25 mg total) by mouth daily. 30 tablet 5  . levalbuterol (XOPENEX HFA) 45 MCG/ACT inhaler Inhale 1-2 puffs into the lungs every 6 (six) hours as needed.    . pantoprazole (PROTONIX) 40 MG tablet Take 1 tablet (40 mg total) by mouth daily. 30 tablet 5  . pravastatin (PRAVACHOL) 10 MG tablet Take 1 tablet (10 mg total) by mouth daily. 30 tablet 3  . Probiotic Product (PROBIOTIC DAILY PO) Take by mouth.    . ranitidine (ZANTAC) 150 MG tablet Take 150 mg by mouth at bedtime.    Marland Kitchen tiotropium (SPIRIVA) 18 MCG inhalation capsule Place 1 capsule (18 mcg total) into inhaler and inhale daily. 30 capsule 5   Current Facility-Administered Medications on File Prior to Visit  Medication Dose Route Frequency Provider  Last Rate Last Dose  . albuterol (PROVENTIL) (2.5 MG/3ML) 0.083% nebulizer solution 2.5 mg  2.5 mg Nebulization Once [name], MD        Review of Systems  Constitutional: Negative for fever and appetite change.  Respiratory: Negative for cough, chest tightness and shortness of breath.   Cardiovascular: Negative for chest pain and palpitations.  Musculoskeletal:       Increased right shoulder pain.  Increased  Pain with raising her arm.  Limited rom.  No neck pain.   Neurological: Negative for dizziness and headaches.  Hematological: Negative for adenopathy.       Objective:    Physical Exam  Constitutional: No distress.  Neck: Neck supple.  Cardiovascular: Normal rate and regular rhythm.   Pulmonary/Chest: Breath sounds normal. No respiratory distress. She has no wheezes.  Abdominal: Soft. Bowel sounds are normal.  Musculoskeletal:  Increased pain right anterior shoulder - with full extension of her arm.  No neck pain with rotation of her head.  Motor strength appears to be equal bilaterally.  Increased prominence of right clavicle.  Positive scoliosis.    Lymphadenopathy:    She has no cervical adenopathy.    BP 100/60 mmHg  Pulse 79  Temp(Src) 98.2 F (36.8 C) (Oral)  Ht 5\' 1"  (1.549 m)  Wt 166 lb 2 oz (75.354 kg)  BMI 31.41 kg/m2  SpO2 95% Wt Readings from Last 3 Encounters:  07/16/14 166 lb 2 oz (75.354 kg)  05/25/14 165 lb 2 oz (74.9 kg)  05/22/14 165 lb (74.844 kg)     Lab Results  Component Value Date   WBC 7.0 06/15/2013   HGB 13.9 06/15/2013   HCT 41.1 06/15/2013   PLT 232.0 06/15/2013   GLUCOSE 103* 05/18/2014   CHOL 176 05/18/2014   TRIG 159.0* 05/18/2014   HDL 59.00 05/18/2014   LDLDIRECT 148.4 11/03/2012   LDLCALC 85 05/18/2014   ALT 19 05/18/2014   AST 23 05/18/2014   NA 138 05/18/2014   K 3.8 05/18/2014   CL 100 05/18/2014   CREATININE 0.65 05/18/2014   BUN 12 05/18/2014   CO2 32 05/18/2014   TSH 0.64 06/26/2013   HGBA1C 5.8  06/26/2013    Mm Screening Breast Tomo Bilateral  07/02/2014   CLINICAL DATA:  Screening.  EXAM: DIGITAL SCREENING BILATERAL MAMMOGRAM WITH 3D TOMO WITH CAD  COMPARISON:  Previous exam(s).  ACR Breast Density Category b: There are scattered areas of fibroglandular density.  FINDINGS: There are no findings suspicious for malignancy. Images were processed with CAD.  IMPRESSION: No mammographic evidence of malignancy. A result letter of this screening mammogram will be mailed directly to the patient.  RECOMMENDATION: Screening mammogram in one year. (Code:[id number]-01Y)  BI-RADS CATEGORY  1: Negative.   Electronically Signed   By: Altamese Cabal M.D.   On: 07/02/2014 13:05       Assessment & Plan:   Problem List Items Addressed This Visit    Right shoulder pain - Primary    Pain as outlined.  Exam as outlined.  Check xray.  No pain to palpation over the clavicle.  Further w/up pending results.  May need ortho referral.        Relevant Orders   DG Shoulder Right (Completed)   DG Clavicle Right (Completed)       [name], MD

## 2014-07-17 ENCOUNTER — Encounter: Payer: Self-pay | Admitting: Internal Medicine

## 2014-07-17 NOTE — Assessment & Plan Note (Signed)
Pain as outlined.  Exam as outlined.  Check xray.  No pain to palpation over the clavicle.  Further w/up pending results.  May need ortho referral.

## 2014-07-18 ENCOUNTER — Other Ambulatory Visit: Payer: Self-pay | Admitting: Internal Medicine

## 2014-07-18 DIAGNOSIS — M25512 Pain in left shoulder: Secondary | ICD-10-CM

## 2014-07-18 NOTE — Progress Notes (Signed)
Order placed for ortho referral.   

## 2014-07-19 ENCOUNTER — Other Ambulatory Visit: Payer: Self-pay | Admitting: *Deleted

## 2014-07-19 MED ORDER — PRAVASTATIN SODIUM 10 MG PO TABS: 10.0000 mg | ORAL_TABLET | Freq: Every day | ORAL | Status: DC

## 2014-07-31 ENCOUNTER — Other Ambulatory Visit: Payer: Self-pay

## 2014-07-31 MED ORDER — CLONAZEPAM 0.5 MG PO TABS: ORAL_TABLET | ORAL | Status: DC

## 2014-08-08 ENCOUNTER — Other Ambulatory Visit (HOSPITAL_COMMUNITY): Payer: Self-pay | Admitting: Orthopedic Surgery

## 2014-08-08 DIAGNOSIS — R079 Chest pain, unspecified: Secondary | ICD-10-CM

## 2014-08-15 ENCOUNTER — Ambulatory Visit: Payer: Medicare PPO

## 2014-08-15 ENCOUNTER — Ambulatory Visit
Admission: RE | Admit: 2014-08-15 | Discharge: 2014-08-15 | Disposition: A | Discharge status: Home / Self Care | Payer: Medicare PPO | Source: Ambulatory Visit | Attending: Orthopedic Surgery | Admitting: Orthopedic Surgery

## 2014-08-15 DIAGNOSIS — R079 Chest pain, unspecified: Secondary | ICD-10-CM | POA: Diagnosis not present

## 2014-08-26 ENCOUNTER — Emergency Department
Admission: EM | Admit: 2014-08-26 | Discharge: 2014-08-26 | Disposition: A | Discharge status: Home / Self Care | Payer: Medicare PPO | Attending: Emergency Medicine | Admitting: Emergency Medicine

## 2014-08-26 ENCOUNTER — Encounter: Payer: Self-pay | Admitting: Emergency Medicine

## 2014-08-26 ENCOUNTER — Emergency Department: Payer: Medicare PPO

## 2014-08-26 DIAGNOSIS — Y998 Other external cause status: Secondary | ICD-10-CM | POA: Diagnosis not present

## 2014-08-26 DIAGNOSIS — S99921A Unspecified injury of right foot, initial encounter: Secondary | ICD-10-CM | POA: Diagnosis present

## 2014-08-26 DIAGNOSIS — Z79899 Other long term (current) drug therapy: Secondary | ICD-10-CM | POA: Diagnosis not present

## 2014-08-26 DIAGNOSIS — Y9389 Activity, other specified: Secondary | ICD-10-CM | POA: Diagnosis not present

## 2014-08-26 DIAGNOSIS — Y9289 Other specified places as the place of occurrence of the external cause: Secondary | ICD-10-CM | POA: Diagnosis not present

## 2014-08-26 DIAGNOSIS — I10 Essential (primary) hypertension: Secondary | ICD-10-CM | POA: Insufficient documentation

## 2014-08-26 DIAGNOSIS — Z7951 Long term (current) use of inhaled steroids: Secondary | ICD-10-CM | POA: Insufficient documentation

## 2014-08-26 DIAGNOSIS — W1839XA Other fall on same level, initial encounter: Secondary | ICD-10-CM | POA: Diagnosis not present

## 2014-08-26 DIAGNOSIS — S92301A Fracture of unspecified metatarsal bone(s), right foot, initial encounter for closed fracture: Secondary | ICD-10-CM

## 2014-08-26 DIAGNOSIS — S92355A Nondisplaced fracture of fifth metatarsal bone, left foot, initial encounter for closed fracture: Secondary | ICD-10-CM | POA: Diagnosis not present

## 2014-08-26 DIAGNOSIS — Z79811 Long term (current) use of aromatase inhibitors: Secondary | ICD-10-CM | POA: Insufficient documentation

## 2014-08-26 MED ORDER — OXYCODONE-ACETAMINOPHEN 5-325 MG PO TABS: ORAL_TABLET | ORAL | Status: DC

## 2014-08-26 MED ORDER — OXYCODONE-ACETAMINOPHEN 5-325 MG PO TABS
1.0000 | ORAL_TABLET | Freq: Once | ORAL | Status: AC
  Administered 2014-08-26: 1 via ORAL
  Filled 2014-08-26: qty 1

## 2014-08-26 NOTE — Discharge Instructions (Signed)
° ° °  ICE AND ELEVATE FOR FOOT PAIN AND SWELLING PERCOCET FOR PAIN AS NEEDED AND DIRECTED USE WOODEN SHOE UNTIL SEEN BY DR. [place]

## 2014-08-26 NOTE — ED Notes (Signed)
Pt states she fell last night and injured her right foot.Marland Kitchen

## 2014-08-26 NOTE — ED Provider Notes (Signed)
[facility] Emergency Department Provider Note  ____________________________________________  Time seen: Approximately 10:07 AM  I have reviewed the triage vital signs and the nursing notes.   HISTORY  Chief Complaint Foot Pain   HPI Jennifer [name] is a 69 y.o. female is here with right foot pain. She states she was seen last night in her foot" went to sleep" when she stood up she was unable to put any weight on her foot and feel. She denies any head injury or loss of consciousness. This morning she still unable to put any weight on her foot.She did take ibuprofen last night without any relief. She denies any previous foot injuries. Pain is a 10 out of 10 at present. In talking with her about her codeine allergy she states there is a long time ago and she does remember what kind of reaction she had.   Past Medical History  Diagnosis Date  . Scoliosis   . Depression   . Hypertension   . Asthma   . COPD (chronic obstructive pulmonary disease)   . Ulcer   . Bronchitis   . Gastritis   . Arthritis   . Chronic headaches   . GERD (gastroesophageal reflux disease)     Patient Active Problem List   Diagnosis Date Noted  . Right shoulder pain 07/16/2014  . Health care maintenance 06/03/2014  . Arm skin lesion, left 06/18/2013  . Conjunctivitis 06/07/2013  . URI (upper respiratory infection) 03/03/2013  . Hyperglycemia 11/12/2012  . Hypercholesterolemia 11/12/2012  . Essential hypertension, benign 05/12/2012  . GERD (gastroesophageal reflux disease) 05/12/2012  . Gastritis 05/12/2012  . History of colonic polyps 05/12/2012  . Asthma 12/13/2011  . Chronic restrictive lung disease 05/11/2011    Past Surgical History  Procedure Laterality Date  . Lumbar disc surgery  1998    [facility]  . Cholecystectomy  1996  . Partial hysterectomy  1981    prolapsed uterus  . Tubal ligation  1978  . Dilation and curettage of uterus  1971  . Abdominal hysterectomy       Current Outpatient Rx  Name  Route  Sig  Dispense  Refill  . citalopram (CELEXA) 40 MG tablet   Oral   Take 1 tablet (40 mg total) by mouth daily.   30 tablet   2   . clonazePAM (KLONOPIN) 0.5 MG tablet      Take 1 to 1 1/2 tablets by mouth twice daily   45 tablet   2   . fluticasone (FLONASE) 50 MCG/ACT nasal spray   Nasal   Place 2 sprays into the nose daily as needed.         . hydrochlorothiazide (HYDRODIURIL) 25 MG tablet   Oral   Take 1 tablet (25 mg total) by mouth daily.   30 tablet   5   . levalbuterol (XOPENEX HFA) 45 MCG/ACT inhaler   Inhalation   Inhale 1-2 puffs into the lungs every 6 (six) hours as needed.         Marland Kitchen oxyCODONE-acetaminophen (PERCOCET) 5-325 MG per tablet      Take one tablet every 4-6 hours prn pain   20 tablet   0   . pantoprazole (PROTONIX) 40 MG tablet   Oral   Take 1 tablet (40 mg total) by mouth daily.   30 tablet   5   . pravastatin (PRAVACHOL) 10 MG tablet   Oral   Take 1 tablet (10 mg total) by mouth daily.  30 tablet   5   . Probiotic Product (PROBIOTIC DAILY PO)   Oral   Take by mouth.         . ranitidine (ZANTAC) 150 MG tablet   Oral   Take 150 mg by mouth at bedtime.         Marland Kitchen tiotropium (SPIRIVA) 18 MCG inhalation capsule   Inhalation   Place 1 capsule (18 mcg total) into inhaler and inhale daily.   30 capsule   5     Allergies Advair diskus and Codeine  Family History  Problem Relation Age of Onset  . Stroke Mother   . CVA Maternal Grandmother   . Breast cancer Neg Hx   . Colon cancer Neg Hx     Social History History  Substance Use Topics  . Smoking status: Never Smoker   . Smokeless tobacco: Never Used  . Alcohol Use: No    Review of Systems Constitutional: No fever/chills Eyes: No visual changes. Cardiovascular: Denies chest pain. Respiratory: Denies shortness of breath. Gastrointestinal: No abdominal pain.  No nausea, no vomiting.  Genitourinary: Negative for  dysuria. Musculoskeletal: Negative for back pain. Skin: Negative for rash. Neurological: Negative for headaches, focal weakness or numbness.  10-point ROS otherwise negative.  ____________________________________________   PHYSICAL EXAM:  VITAL SIGNS: ED Triage Vitals  Enc Vitals Group     BP 08/26/14 0901 127/61 mmHg     Pulse Rate 08/26/14 0901 84     Resp 08/26/14 0901 20     Temp 08/26/14 0901 98.1 F (36.7 C)     Temp Source 08/26/14 0901 Oral     SpO2 08/26/14 0901 95 %     Weight 08/26/14 0900 166 lb (75.297 kg)     Height 08/26/14 0900 5\' 1"  (1.549 m)     Head Cir --      Peak Flow --      Pain Score 08/26/14 0900 10     Pain Loc --      Pain Edu? --      Excl. in [place]? --     Constitutional: Alert and oriented. Well appearing and in no acute distress. Eyes: Conjunctivae are normal. PERRL. EOMI. Head: Atraumatic. Nose: No congestion/rhinnorhea. Neck: No stridor.   Cardiovascular: Normal rate, regular rhythm. Grossly normal heart sounds.  Good peripheral circulation. Respiratory: Normal respiratory effort.  No retractions. Lungs CTAB. Gastrointestinal: Soft and nontender. No distention. Musculoskeletal: No lower extremity tenderness nor edema.  No joint effusions. Neurologic:  Normal speech and language. No gross focal neurologic deficits are appreciated.  Skin:  Skin is warm, dry and intact. No rash noted. Psychiatric: Mood and affect are normal. Speech and behavior are normal.  ____________________________________________   LABS (all labs ordered are listed, but only abnormal results are displayed)  Labs Reviewed - No data to display  RADIOLOGY  Right foot x-ray per radiologist and reviewed by me shows nondisplaced fracture proximal fifth metatarsal. I, Jennifer [name], personally viewed and evaluated these images as part of my medical decision making.  ____________________________________________   PROCEDURES  Procedure(s) performed:  None  Critical Care performed: No  ____________________________________________   INITIAL IMPRESSION / ASSESSMENT AND PLAN / ED COURSE  Pertinent labs & imaging results that were available during my care of the patient were reviewed by me and considered in my medical decision making (see chart for details  patient will be following up with Dr. Vickki Muff. She was given a prescription for Percocet for pain. Her foot  was wrapped with an Ace wrap and a postop shoe was applied. Patient was able to maneuver on a walker better than crutches. She is told to ice and elevate as needed for pain and swelling. She is return to the emergency room if any severe worsening of her symptoms.  FINAL CLINICAL IMPRESSION(S) / ED DIAGNOSES  Final diagnoses:  Fracture of metatarsal of right foot, closed, initial encounter      Jennifer Hai, PA-C 08/26/14 [place], MD 08/26/14 2149

## 2014-08-29 ENCOUNTER — Encounter: Payer: Self-pay | Admitting: Internal Medicine

## 2014-10-15 ENCOUNTER — Ambulatory Visit
Admission: RE | Admit: 2014-10-15 | Discharge: 2014-10-15 | Disposition: A | Discharge status: Home / Self Care | Payer: Medicare PPO | Source: Ambulatory Visit | Attending: Podiatry | Admitting: Podiatry

## 2014-10-15 ENCOUNTER — Other Ambulatory Visit: Payer: Self-pay | Admitting: Podiatry

## 2014-10-15 DIAGNOSIS — M7989 Other specified soft tissue disorders: Secondary | ICD-10-CM | POA: Insufficient documentation

## 2014-10-15 DIAGNOSIS — M79661 Pain in right lower leg: Secondary | ICD-10-CM

## 2014-10-17 ENCOUNTER — Ambulatory Visit: Payer: Medicare PPO | Admitting: Internal Medicine

## 2014-10-31 ENCOUNTER — Other Ambulatory Visit: Payer: Self-pay

## 2014-10-31 MED ORDER — PANTOPRAZOLE SODIUM 40 MG PO TBEC: 40.0000 mg | DELAYED_RELEASE_TABLET | Freq: Every day | ORAL | Status: DC

## 2014-11-08 ENCOUNTER — Ambulatory Visit
Admission: RE | Admit: 2014-11-08 | Discharge: 2014-11-08 | Disposition: A | Discharge status: Home / Self Care | Payer: Medicare PPO | Source: Ambulatory Visit | Attending: Internal Medicine | Admitting: Internal Medicine

## 2014-11-08 ENCOUNTER — Encounter: Payer: Self-pay | Admitting: Internal Medicine

## 2014-11-08 ENCOUNTER — Ambulatory Visit (INDEPENDENT_AMBULATORY_CARE_PROVIDER_SITE_OTHER): Payer: Medicare PPO | Admitting: Internal Medicine

## 2014-11-08 ENCOUNTER — Telehealth: Payer: Self-pay | Admitting: Internal Medicine

## 2014-11-08 VITALS — BP 120/70 | HR 97 | Temp 98.4°F | Resp 18 | Ht 61.0 in | Wt 166.0 lb

## 2014-11-08 DIAGNOSIS — R0789 Other chest pain: Secondary | ICD-10-CM

## 2014-11-08 DIAGNOSIS — I10 Essential (primary) hypertension: Secondary | ICD-10-CM

## 2014-11-08 DIAGNOSIS — J069 Acute upper respiratory infection, unspecified: Secondary | ICD-10-CM

## 2014-11-08 DIAGNOSIS — R7981 Abnormal blood-gas level: Secondary | ICD-10-CM | POA: Insufficient documentation

## 2014-11-08 DIAGNOSIS — R0602 Shortness of breath: Secondary | ICD-10-CM

## 2014-11-08 LAB — POCT I-STAT CREATININE: Creatinine, Ser: 0.7 mg/dL (ref 0.44–1.00)

## 2014-11-08 MED ORDER — IOHEXOL 350 MG/ML SOLN
75.0000 mL | Freq: Once | INTRAVENOUS | Status: AC | PRN
  Administered 2014-11-08: 75 mL via INTRAVENOUS

## 2014-11-08 MED ORDER — PREDNISONE 10 MG PO TABS: ORAL_TABLET | ORAL | Status: DC

## 2014-11-08 MED ORDER — CEFDINIR 300 MG PO CAPS: 300.0000 mg | ORAL_CAPSULE | Freq: Two times a day (BID) | ORAL | Status: DC

## 2014-11-08 NOTE — Progress Notes (Signed)
Pre-visit discussion using our clinic review tool. No additional management support is needed unless otherwise documented below in the visit note.  

## 2014-11-08 NOTE — Telephone Encounter (Signed)
Pt called about having a cough,congestion with headache. Pt only wants to see Dr Nicki Reaper. No appt avail to sch. Let me know where to sch pt. Thank You!

## 2014-11-08 NOTE — Progress Notes (Signed)
Patient ID: [name], female   DOB: Dec 02, 1945, 69 y.o.   MRN: [id number]   Subjective:    Patient ID: [name], female    DOB: Dec 20, 1945, 69 y.o.   MRN: [id number]  HPI  Patient with documented history of COPD/asthma who comes in as a work in with concerns regarding cough and congestion.  Started several days ago with sneezing.  This resolved.  Tuesday - sore throat.  Throat is better.  Started yesterday with increased cough.  Temperature 99.3.  Some nasal congestion.  Chest is tight.  She has had issues with her breathing previously.  States this feels somewhat different.  Cough is mostly non productive.  Has had recent foot issues.  Has a boot on.  Has not been as mobile.  Has lower extremity swelling - right leg.  Wearing compression hose.  Had previous (recent) lower extremity ultrasound.  Negative for DVT.  Feels she cannot get a good breath.  No vomiting.  No diarrhea.     Past Medical History  Diagnosis Date  . Scoliosis   . Depression   . Hypertension   . Asthma   . COPD (chronic obstructive pulmonary disease) ([place])   . Ulcer   . Bronchitis   . Gastritis   . Arthritis   . Chronic headaches   . GERD (gastroesophageal reflux disease)    Past Surgical History  Procedure Laterality Date  . Lumbar disc surgery  1998    [facility]  . Cholecystectomy  1996  . Partial hysterectomy  1981    prolapsed uterus  . Tubal ligation  1978  . Dilation and curettage of uterus  1971  . Abdominal hysterectomy     Family History  Problem Relation Age of Onset  . Stroke Mother   . CVA Maternal Grandmother   . Breast cancer Neg Hx   . Colon cancer Neg Hx    Social History   Social History  . Marital Status: Married    Spouse Name: N/A  . Number of Children: 2  . Years of Education: N/A   Occupational History  . retired Pharmacist, hospital    Social History Main Topics  . Smoking status: Never Smoker   . Smokeless tobacco: Never Used  . Alcohol Use: No  . Drug Use: No  . Sexual  Activity: Not Asked   Other Topics Concern  . None   Social History Narrative    Outpatient Encounter Prescriptions as of 11/08/2014  Medication Sig  . citalopram (CELEXA) 40 MG tablet Take 1 tablet (40 mg total) by mouth daily.  . clonazePAM (KLONOPIN) 0.5 MG tablet Take 1 to 1 1/2 tablets by mouth twice daily  . diclofenac sodium (VOLTAREN) 1 % GEL   . fluticasone (FLONASE) 50 MCG/ACT nasal spray Place 2 sprays into the nose daily as needed.  . hydrochlorothiazide (HYDRODIURIL) 25 MG tablet Take 1 tablet (25 mg total) by mouth daily.  Marland Kitchen levalbuterol (XOPENEX HFA) 45 MCG/ACT inhaler Inhale 1-2 puffs into the lungs every 6 (six) hours as needed.  Marland Kitchen oxyCODONE-acetaminophen (PERCOCET) 5-325 MG per tablet Take one tablet every 4-6 hours prn pain  . pantoprazole (PROTONIX) 40 MG tablet Take 1 tablet (40 mg total) by mouth daily.  . pravastatin (PRAVACHOL) 10 MG tablet Take 1 tablet (10 mg total) by mouth daily.  . Probiotic Product (PROBIOTIC DAILY PO) Take by mouth.  . ranitidine (ZANTAC) 150 MG tablet Take 150 mg by mouth at bedtime.  Marland Kitchen tiotropium (SPIRIVA) 18  MCG inhalation capsule Place 1 capsule (18 mcg total) into inhaler and inhale daily.  . cefdinir (OMNICEF) 300 MG capsule Take 1 capsule (300 mg total) by mouth 2 (two) times daily.  . predniSONE (DELTASONE) 10 MG tablet Take 6 tablets x 1 day and then decrease by 1/2 tablet per day until down to zero mg.   Facility-Administered Encounter Medications as of 11/08/2014  Medication  . albuterol (PROVENTIL) (2.5 MG/3ML) 0.083% nebulizer solution 2.5 mg    Review of Systems  Constitutional: Positive for fever (low grade fever). Negative for unexpected weight change.  HENT: Positive for congestion, sneezing and sore throat.   Eyes: Negative for discharge and visual disturbance.  Respiratory: Positive for cough, chest tightness, shortness of breath and wheezing.   Cardiovascular: Positive for leg swelling (right lower extremity  swelling.  ). Negative for palpitations.  Gastrointestinal: Negative for nausea, vomiting, abdominal pain and diarrhea.  Musculoskeletal: Negative for joint swelling.  Skin: Negative for color change and rash.  Neurological: Negative for dizziness, light-headedness and headaches.  Psychiatric/Behavioral: Negative for dysphoric mood and agitation.       Objective:    Physical Exam  HENT:  Nose: Nose normal.  Mouth/Throat: Oropharynx is clear and moist.  Eyes: Conjunctivae are normal. Right eye exhibits no discharge. Left eye exhibits no discharge.  Neck: Neck supple.  Cardiovascular: Normal rate and regular rhythm.   Pulmonary/Chest:  Increased cough.  Increased cough with forced expiration.    Musculoskeletal:  Increased lower extremity swelling - right leg.    Lymphadenopathy:    She has no cervical adenopathy.  Skin: Skin is warm and dry.  Psychiatric: She has a normal mood and affect. Her behavior is normal.    BP 120/70 mmHg  Pulse 97  Temp(Src) 98.4 F (36.9 C) (Oral)  Resp 18  Ht 5\' 1"  (1.549 m)  Wt 166 lb (75.297 kg)  BMI 31.38 kg/m2  SpO2 92% Wt Readings from Last 3 Encounters:  11/08/14 166 lb (75.297 kg)  08/26/14 166 lb (75.297 kg)  07/16/14 166 lb 2 oz (75.354 kg)     Lab Results  Component Value Date   WBC 7.0 06/15/2013   HGB 13.9 06/15/2013   HCT 41.1 06/15/2013   PLT 232.0 06/15/2013   GLUCOSE 103* 05/18/2014   CHOL 176 05/18/2014   TRIG 159.0* 05/18/2014   HDL 59.00 05/18/2014   LDLDIRECT 148.4 11/03/2012   LDLCALC 85 05/18/2014   ALT 19 05/18/2014   AST 23 05/18/2014   NA 138 05/18/2014   K 3.8 05/18/2014   CL 100 05/18/2014   CREATININE 0.70 11/08/2014   BUN 12 05/18/2014   CO2 32 05/18/2014   TSH 0.64 06/26/2013   HGBA1C 5.8 06/26/2013    US Venous Img Lower Unilateral Right  10/15/2014  CLINICAL DATA:  Pain and swelling, right lower extremity, x1 week. Redness. Varicose/ spider veins. Recent metatarsal fracture. EXAM: RIGHT  LOWER EXTREMITY VENOUS DOPPLER ULTRASOUND TECHNIQUE: Gray-scale sonography with compression, as well as color and duplex ultrasound, were performed to evaluate the deep venous system from the level of the common femoral vein through the popliteal and proximal calf veins. COMPARISON:  None FINDINGS: Normal compressibility of the common femoral, superficial femoral, and popliteal veins, as well as the proximal calf veins. No filling defects to suggest DVT on grayscale or color Doppler imaging. Doppler waveforms show normal direction of venous flow, normal respiratory phasicity and response to augmentation. Survey views of the contralateral common femoral vein are unremarkable. IMPRESSION:  1. No evidence of lower extremity deep vein thrombosis, RIGHT. Electronically Signed   By: Lucrezia Europe M.D.   On: 10/15/2014 14:00       Assessment & Plan:   Problem List Items Addressed This Visit    Essential hypertension, benign    Blood pressure under good control.  Continue same medication regimen.  Follow pressures.  Follow metabolic panel.        SOB (shortness of breath)    With sob as outlined.  Treat the infection and acute respiratory flare as outlined.  With the recent foot fracture and leg swelling and her being more immobile, need to confirm no PE.  States her sob is different from previous flares.  Check CT angio to confirm no PE.        Relevant Orders   CT Angio Chest W/Cm &/Or Wo Cm (Completed)   URI (upper respiratory infection)    With increased cough and congestion as outlined.  Will treat for URI.  With the wheezing, etc.  Treat with prednisone taper as directed.  omnicef as directed.  Has nebulizer at home.  Will continue.  Saline nasal spray and nasacort as directed.  Will obtain CT chest as outlined.        Relevant Medications   cefdinir (OMNICEF) 300 MG capsule    Other Visit Diagnoses    Low oxygen saturation    -  Primary    Relevant Orders    CT Angio Chest W/Cm &/Or Wo Cm  (Completed)    Chest tightness        Relevant Orders    CT Angio Chest W/Cm &/Or Wo Cm (Completed)        [name], MD

## 2014-11-08 NOTE — Telephone Encounter (Signed)
Spoke with patient, offered/excepted 11am appointment with Dr. Nicki Reaper

## 2014-11-08 NOTE — Patient Instructions (Signed)
Saline nasal spray - flush nose at least 2-3x/day  nasacort nasal spray - 2 sprays each nostril one time per day.  Do this in the evening.   Use the inhalers as directed.  Nebulizer as directed.

## 2014-11-09 ENCOUNTER — Telehealth: Payer: Self-pay | Admitting: *Deleted

## 2014-11-09 MED ORDER — IPRATROPIUM BROMIDE 0.02 % IN SOLN: 0.5000 mg | Freq: Four times a day (QID) | RESPIRATORY_TRACT | Status: DC

## 2014-11-09 NOTE — Telephone Encounter (Signed)
Pt called & states that she does not have enough medication for the nebulizer to make it over the weekend. She has been using about 3-4 times a day-prn. She states that she also need new tubing for the neb. Please advise

## 2014-11-09 NOTE — Telephone Encounter (Signed)
Spoke to pt.  Spoke to [place] at [facility].  They have tubing at the pharmacy.  Refilled the atrovent neb.   Pt aware.  If feeling better.  Will keep [place] posted.

## 2014-11-11 ENCOUNTER — Encounter: Payer: Self-pay | Admitting: Internal Medicine

## 2014-11-11 ENCOUNTER — Other Ambulatory Visit: Payer: Self-pay | Admitting: Internal Medicine

## 2014-11-11 DIAGNOSIS — R0602 Shortness of breath: Secondary | ICD-10-CM | POA: Insufficient documentation

## 2014-11-11 NOTE — Assessment & Plan Note (Signed)
With sob as outlined.  Treat the infection and acute respiratory flare as outlined.  With the recent foot fracture and leg swelling and her being more immobile, need to confirm no PE.  States her sob is different from previous flares.  Check CT angio to confirm no PE.

## 2014-11-11 NOTE — Assessment & Plan Note (Signed)
With increased cough and congestion as outlined.  Will treat for URI.  With the wheezing, etc.  Treat with prednisone taper as directed.  omnicef as directed.  Has nebulizer at home.  Will continue.  Saline nasal spray and nasacort as directed.  Will obtain CT chest as outlined.

## 2014-11-11 NOTE — Assessment & Plan Note (Signed)
Blood pressure under good control.  Continue same medication regimen.  Follow pressures.  Follow metabolic panel.   

## 2014-11-12 ENCOUNTER — Telehealth: Payer: Self-pay | Admitting: Internal Medicine

## 2014-11-12 MED ORDER — BENZONATATE 100 MG PO CAPS: 100.0000 mg | ORAL_CAPSULE | Freq: Three times a day (TID) | ORAL | Status: DC | PRN

## 2014-11-12 NOTE — Telephone Encounter (Signed)
rx sent in to [facility] for [employer] #21 with no refills.

## 2014-11-12 NOTE — Telephone Encounter (Signed)
Please advise 

## 2014-11-12 NOTE — Telephone Encounter (Signed)
Pt called to report her breathing better. Pt just has cough and wanted to know if she can take a different medication. Pt states she was up til 4am coughing. Pharmacy is [facility] in [place].  Thank You!

## 2014-11-12 NOTE — Telephone Encounter (Signed)
Spoke with patient, she is agreeable, please send to [facility].

## 2014-11-12 NOTE — Telephone Encounter (Signed)
See prior note

## 2014-11-12 NOTE — Telephone Encounter (Signed)
Can try tessalon perles 100mg  tid prn cough.  If agreeable, let me know and I will send in the prescription.

## 2014-11-12 NOTE — Telephone Encounter (Signed)
Pt called to requires rx for Tessalon Pearls

## 2014-12-17 ENCOUNTER — Other Ambulatory Visit: Payer: Self-pay

## 2014-12-17 ENCOUNTER — Telehealth: Payer: Self-pay | Admitting: Internal Medicine

## 2014-12-17 ENCOUNTER — Ambulatory Visit: Payer: Medicare PPO | Admitting: Internal Medicine

## 2014-12-17 MED ORDER — TIOTROPIUM BROMIDE MONOHYDRATE 18 MCG IN CAPS: 18.0000 ug | ORAL_CAPSULE | Freq: Every day | RESPIRATORY_TRACT | Status: DC

## 2014-12-17 NOTE — Telephone Encounter (Signed)
Ok to take off schedule 

## 2014-12-17 NOTE — Telephone Encounter (Signed)
FYI, Pt not able to come in today due to having to babysit her granddaughter which was last minute. Appt is still on schedule. I did advise about the $50 cancellation fee. Thank You!

## 2014-12-17 NOTE — Telephone Encounter (Signed)
See below

## 2014-12-17 NOTE — Telephone Encounter (Signed)
Ok! Thank You! It was done.

## 2014-12-24 ENCOUNTER — Other Ambulatory Visit: Payer: Self-pay | Admitting: Internal Medicine

## 2015-01-16 ENCOUNTER — Other Ambulatory Visit: Payer: Self-pay | Admitting: Internal Medicine

## 2015-02-08 ENCOUNTER — Other Ambulatory Visit: Payer: Self-pay | Admitting: Family Medicine

## 2015-02-08 NOTE — Telephone Encounter (Signed)
Last OV 11/08/14  Last filled on 07/31/14

## 2015-02-08 NOTE — Telephone Encounter (Signed)
Please confirm with pt how she is taking the medication and if we are normally refilling this medication for her.

## 2015-02-09 MED ORDER — CLONAZEPAM 0.5 MG PO TABS: ORAL_TABLET | ORAL | Status: DC

## 2015-02-09 NOTE — Telephone Encounter (Signed)
ok'd refill clonazepam #30 with one refill.

## 2015-02-09 NOTE — Telephone Encounter (Signed)
Pt states that she only takes 1/2 tablet daily. Pt only has 1/2 left to take today & her pharmacy closes today @ 2pm. She was hoping you could call this in for her before they close.

## 2015-02-11 NOTE — Telephone Encounter (Signed)
Rx faxed

## 2015-02-12 ENCOUNTER — Other Ambulatory Visit: Payer: Self-pay | Admitting: Internal Medicine

## 2015-02-13 ENCOUNTER — Telehealth: Payer: Self-pay | Admitting: Internal Medicine

## 2015-02-13 NOTE — Telephone Encounter (Signed)
Pt lvm stating that she was returning dr. Bary Leriche phone call

## 2015-02-14 NOTE — Telephone Encounter (Signed)
I have not called her.  Wasn't sure if you have tried to call.  I could not find anything in the chart.  Maybe another office called her.  Can call and let her know.

## 2015-02-24 ENCOUNTER — Other Ambulatory Visit: Payer: Self-pay | Admitting: Internal Medicine

## 2015-02-25 ENCOUNTER — Telehealth: Payer: Self-pay | Admitting: Internal Medicine

## 2015-02-25 NOTE — Telephone Encounter (Signed)
Pt called about having a really bad piercing cough. Pt states she can not sleep at night for coughing. Pt does not want to see any other provider. Let me know where to sch pt. Call pt @ [phone]. Thank you!

## 2015-02-25 NOTE — Telephone Encounter (Signed)
Dr.Scott I scheduled your pt to come in on 02/27/15 but she wants to be seen sooner because she has been coughing and having SOB, can you se her sooner?

## 2015-02-25 NOTE — Telephone Encounter (Signed)
I am unable to see her today.  If she is acutely ill, would recommend going ahead and being seen today and then can f/u with [place] after.  The only place I can put her tomorrow is 1:15.  I will need coverage to put pt back - Carolee Rota will be at lunch.  Unable to work in on 02/27/15.  We have a lunch meeting and I am off pm.

## 2015-02-25 NOTE — Telephone Encounter (Signed)
Spoke with Patient, she will come tomorrow at 115pm.  I will have someone to room your patients thanks.

## 2015-02-25 NOTE — Telephone Encounter (Signed)
Appointment made 02/27/15. LMOM

## 2015-02-26 ENCOUNTER — Ambulatory Visit (INDEPENDENT_AMBULATORY_CARE_PROVIDER_SITE_OTHER): Payer: Medicare PPO | Admitting: Internal Medicine

## 2015-02-26 ENCOUNTER — Ambulatory Visit
Admission: RE | Admit: 2015-02-26 | Discharge: 2015-02-26 | Disposition: A | Discharge status: Home / Self Care | Payer: Medicare PPO | Source: Ambulatory Visit | Attending: Internal Medicine | Admitting: Internal Medicine

## 2015-02-26 ENCOUNTER — Encounter: Payer: Self-pay | Admitting: Internal Medicine

## 2015-02-26 VITALS — BP 102/60 | HR 88 | Temp 98.0°F | Resp 16 | Ht 61.0 in | Wt 166.0 lb

## 2015-02-26 DIAGNOSIS — R05 Cough: Secondary | ICD-10-CM

## 2015-02-26 DIAGNOSIS — R059 Cough, unspecified: Secondary | ICD-10-CM

## 2015-02-26 DIAGNOSIS — J069 Acute upper respiratory infection, unspecified: Secondary | ICD-10-CM | POA: Diagnosis not present

## 2015-02-26 DIAGNOSIS — R509 Fever, unspecified: Secondary | ICD-10-CM

## 2015-02-26 DIAGNOSIS — M4135 Thoracogenic scoliosis, thoracolumbar region: Secondary | ICD-10-CM | POA: Diagnosis not present

## 2015-02-26 DIAGNOSIS — J984 Other disorders of lung: Secondary | ICD-10-CM

## 2015-02-26 DIAGNOSIS — R0602 Shortness of breath: Secondary | ICD-10-CM | POA: Diagnosis not present

## 2015-02-26 MED ORDER — ALBUTEROL SULFATE (2.5 MG/3ML) 0.083% IN NEBU
2.5000 mg | INHALATION_SOLUTION | Freq: Once | RESPIRATORY_TRACT | Status: AC
  Administered 2015-02-26: 2.5 mg via RESPIRATORY_TRACT

## 2015-02-26 MED ORDER — CEFDINIR 300 MG PO CAPS: 300.0000 mg | ORAL_CAPSULE | Freq: Two times a day (BID) | ORAL | Status: DC

## 2015-02-26 MED ORDER — PREDNISONE 10 MG PO TABS: ORAL_TABLET | ORAL | Status: DC

## 2015-02-26 NOTE — Progress Notes (Signed)
Pre-visit discussion using our clinic review tool. No additional management support is needed unless otherwise documented below in the visit note.  

## 2015-02-26 NOTE — Progress Notes (Signed)
[name], female   DOB: January 18, 1946, 70 y.o.   MRN: [id number]   Subjective:    [name], female    DOB: July 17, 1945, 70 y.o.   MRN: [id number]  HPI  Patient with past history of hypertension, GERD, severe scoliosis and depression.  She comes in today as a work in with concerns regarding cough and congestion.  States that starting over the last week,  she noticed sore throat.  Then sneezing.  Has progressed.  Increased cough and congestion.  Felt she could not breathe.  Cough is productive of colored mucus - brown mucus.  No blood.  Wheezing.  No sore throat now.  No increased drainage.  No vomiting.  No diarrhea.  Temp - 100.  Taking nebs.  Last neb at 3:00 this am.  Is eating.  No diarrhea.     Past Medical History  Diagnosis Date  . Scoliosis   . Depression   . Hypertension   . Asthma   . COPD (chronic obstructive pulmonary disease) ([place])   . Ulcer   . Bronchitis   . Gastritis   . Arthritis   . Chronic headaches   . GERD (gastroesophageal reflux disease)    Past Surgical History  Procedure Laterality Date  . Lumbar disc surgery  1998    [facility]  . Cholecystectomy  1996  . Partial hysterectomy  1981    prolapsed uterus  . Tubal ligation  1978  . Dilation and curettage of uterus  1971  . Abdominal hysterectomy     Family History  Problem Relation Age of Onset  . Stroke Mother   . CVA Maternal Grandmother   . Breast cancer Neg Hx   . Colon cancer Neg Hx    Social History   Social History  . Marital Status: Married    Spouse Name: N/A  . Number of Children: 2  . Years of Education: N/A   Occupational History  . retired Pharmacist, hospital    Social History Main Topics  . Smoking status: Never Smoker   . Smokeless tobacco: Never Used  . Alcohol Use: No  . Drug Use: No  . Sexual Activity: Not Asked   Other Topics Concern  . None   Social History Narrative    Outpatient Encounter Prescriptions as of 02/26/2015  Medication Sig  . benzonatate  (TESSALON) 100 MG capsule Take 1 capsule (100 mg total) by mouth 3 (three) times daily as needed for cough.  . citalopram (CELEXA) 40 MG tablet Take 1 tablet (40 mg total) by mouth daily.  . clonazePAM (KLONOPIN) 0.5 MG tablet Take 1/2 - 1 tablet q day prn  . diclofenac sodium (VOLTAREN) 1 % GEL   . fluticasone (FLONASE) 50 MCG/ACT nasal spray Place 2 sprays into the nose daily as needed.  . hydrochlorothiazide (HYDRODIURIL) 25 MG tablet Take 1 tablet (25 mg total) by mouth daily.  Marland Kitchen ipratropium (ATROVENT) 0.02 % nebulizer solution Take 2.5 mLs (0.5 mg total) by nebulization 4 (four) times daily.  Marland Kitchen levalbuterol (XOPENEX HFA) 45 MCG/ACT inhaler Inhale 1-2 puffs into the lungs every 6 (six) hours as needed.  . pantoprazole (PROTONIX) 40 MG tablet Take 1 tablet (40 mg total) by mouth daily.  . pravastatin (PRAVACHOL) 10 MG tablet Take 1 tablet (10 mg total) by mouth daily.  . Probiotic Product (PROBIOTIC DAILY PO) Take by mouth.  . ranitidine (ZANTAC) 150 MG tablet Take 150 mg by mouth at bedtime.  Marland Kitchen tiotropium (SPIRIVA)  18 MCG inhalation capsule Place 1 capsule (18 mcg total) into inhaler and inhale daily.  . cefdinir (OMNICEF) 300 MG capsule Take 1 capsule (300 mg total) by mouth 2 (two) times daily.  Marland Kitchen oxyCODONE-acetaminophen (PERCOCET) 5-325 MG per tablet Take one tablet every 4-6 hours prn pain (Patient not taking: Reported on 02/26/2015)  . predniSONE (DELTASONE) 10 MG tablet Take 6 tablets x 1 day and then decrease by 1/2 tablet per day until down to zero mg.  . [DISCONTINUED] cefdinir (OMNICEF) 300 MG capsule Take 1 capsule (300 mg total) by mouth 2 (two) times daily. (Patient not taking: Reported on 02/26/2015)  . [DISCONTINUED] predniSONE (DELTASONE) 10 MG tablet Take 6 tablets x 1 day and then decrease by 1/2 tablet per day until down to zero mg. (Patient not taking: Reported on 02/26/2015)  . [EXPIRED] albuterol (PROVENTIL) (2.5 MG/3ML) 0.083% nebulizer solution 2.5 mg   . [DISCONTINUED]  albuterol (PROVENTIL) (2.5 MG/3ML) 0.083% nebulizer solution 2.5 mg    No facility-administered encounter medications on file as of 02/26/2015.    Review of Systems  Constitutional: Positive for fever and fatigue.  HENT: Positive for congestion. Negative for sinus pressure.   Eyes: Negative for discharge and redness.  Respiratory: Positive for cough, chest tightness and shortness of breath.   Cardiovascular: Negative for chest pain, palpitations and leg swelling.  Gastrointestinal: Negative for nausea, vomiting, abdominal pain and diarrhea.  Skin: Negative for color change and rash.  Neurological: Negative for dizziness, light-headedness and headaches.  Psychiatric/Behavioral: Negative for dysphoric mood and agitation.       Objective:    Physical Exam  Constitutional: She appears well-developed and well-nourished. No distress.  HENT:  Nose: Nose normal.  Mouth/Throat: Oropharynx is clear and moist.  Eyes: Conjunctivae are normal. Right eye exhibits no discharge. Left eye exhibits no discharge.  Neck: Neck supple.  Cardiovascular: Normal rate and regular rhythm.   Pulmonary/Chest: No respiratory distress. She has wheezes.  Increased cough with expiration.    Lymphadenopathy:    She has no cervical adenopathy.    BP 102/60 mmHg  Pulse 88  Temp(Src) 98 F (36.7 C) (Oral)  Resp 16  Ht 5\' 1"  (1.549 m)  Wt 166 lb (75.297 kg)  BMI 31.38 kg/m2  SpO2 95% Wt Readings from Last 3 Encounters:  02/26/15 166 lb (75.297 kg)  11/08/14 166 lb (75.297 kg)  08/26/14 166 lb (75.297 kg)     Lab Results  Component Value Date   WBC 7.0 06/15/2013   HGB 13.9 06/15/2013   HCT 41.1 06/15/2013   PLT 232.0 06/15/2013   GLUCOSE 103* 05/18/2014   CHOL 176 05/18/2014   TRIG 159.0* 05/18/2014   HDL 59.00 05/18/2014   LDLDIRECT 148.4 11/03/2012   LDLCALC 85 05/18/2014   ALT 19 05/18/2014   AST 23 05/18/2014   NA 138 05/18/2014   K 3.8 05/18/2014   CL 100 05/18/2014   CREATININE 0.70  11/08/2014   BUN 12 05/18/2014   CO2 32 05/18/2014   TSH 0.64 06/26/2013   HGBA1C 5.8 06/26/2013    Dg Chest 2 View  02/26/2015  CLINICAL DATA:  Productive cough for 3 days, fever EXAM: CHEST  2 VIEW COMPARISON:  Chest x-ray of 08/15/2014 and CT chest of 11/08/2014 FINDINGS: No active infiltrate or effusion is seen. Heart size appears stable. Severe thoracolumbar scoliosis is again noted with associated degenerative change. IMPRESSION: No change in severe thoracolumbar scoliosis with degenerative change. No active lung disease. Electronically Signed   By:  Ivar Drape M.D.   On: 02/26/2015 14:42       Assessment & Plan:   Problem List Items Addressed This Visit    Chronic restrictive lung disease    Treat infection as outlined.  Prednisone taper as directed.  Follow.        SOB (shortness of breath) - Primary   Relevant Medications   albuterol (PROVENTIL) (2.5 MG/3ML) 0.083% nebulizer solution 2.5 mg (Completed)   URI (upper respiratory infection)    Increased cough and congestion as outlined.  Tightness and wheezing.  Albuterol neb given here in the office.  Treat with omnicef as directed.  Prednisone taper as directed.  Continue inhalers.  Rest.  Fluids.  Follow.  Call with update.  Check cxr.       Relevant Medications   cefdinir (OMNICEF) 300 MG capsule    Other Visit Diagnoses    Cough        Relevant Orders    DG Chest 2 View (Completed)    Fever, unspecified fever cause        Relevant Orders    DG Chest 2 View (Completed)        [name], MD

## 2015-02-26 NOTE — Patient Instructions (Signed)
Robitussin DM twice a day.

## 2015-02-27 ENCOUNTER — Ambulatory Visit: Payer: Medicare PPO | Admitting: Internal Medicine

## 2015-03-04 ENCOUNTER — Encounter: Payer: Self-pay | Admitting: Internal Medicine

## 2015-03-04 NOTE — Assessment & Plan Note (Signed)
Increased cough and congestion as outlined.  Tightness and wheezing.  Albuterol neb given here in the office.  Treat with omnicef as directed.  Prednisone taper as directed.  Continue inhalers.  Rest.  Fluids.  Follow.  Call with update.  Check cxr.

## 2015-03-04 NOTE — Assessment & Plan Note (Signed)
Treat infection as outlined.  Prednisone taper as directed.  Follow.

## 2015-03-19 ENCOUNTER — Ambulatory Visit: Payer: Medicare PPO

## 2015-05-21 ENCOUNTER — Other Ambulatory Visit: Payer: Self-pay

## 2015-05-21 ENCOUNTER — Telehealth: Payer: Self-pay | Admitting: Internal Medicine

## 2015-05-21 NOTE — Telephone Encounter (Signed)
Pt requesting a refill on Pravastatin, last filled on 02/07, last OV 1/24. Ok to refill?

## 2015-05-22 ENCOUNTER — Ambulatory Visit
Admission: RE | Admit: 2015-05-22 | Discharge: 2015-05-22 | Disposition: A | Discharge status: Home / Self Care | Payer: Medicare PPO | Source: Ambulatory Visit | Attending: Internal Medicine | Admitting: Internal Medicine

## 2015-05-22 ENCOUNTER — Ambulatory Visit (INDEPENDENT_AMBULATORY_CARE_PROVIDER_SITE_OTHER): Payer: Medicare PPO | Admitting: Internal Medicine

## 2015-05-22 ENCOUNTER — Encounter: Payer: Self-pay | Admitting: Internal Medicine

## 2015-05-22 VITALS — BP 110/70 | HR 81 | Temp 97.9°F | Resp 18 | Ht 61.0 in | Wt 166.0 lb

## 2015-05-22 DIAGNOSIS — E7800 Pure hypercholesterolemia, unspecified: Secondary | ICD-10-CM

## 2015-05-22 DIAGNOSIS — M545 Low back pain, unspecified: Secondary | ICD-10-CM

## 2015-05-22 DIAGNOSIS — M4185 Other forms of scoliosis, thoracolumbar region: Secondary | ICD-10-CM | POA: Insufficient documentation

## 2015-05-22 DIAGNOSIS — M5136 Other intervertebral disc degeneration, lumbar region: Secondary | ICD-10-CM | POA: Diagnosis not present

## 2015-05-22 DIAGNOSIS — R739 Hyperglycemia, unspecified: Secondary | ICD-10-CM

## 2015-05-22 DIAGNOSIS — M25552 Pain in left hip: Secondary | ICD-10-CM | POA: Diagnosis present

## 2015-05-22 DIAGNOSIS — J4520 Mild intermittent asthma, uncomplicated: Secondary | ICD-10-CM

## 2015-05-22 DIAGNOSIS — Z1239 Encounter for other screening for malignant neoplasm of breast: Secondary | ICD-10-CM | POA: Diagnosis not present

## 2015-05-22 DIAGNOSIS — I10 Essential (primary) hypertension: Secondary | ICD-10-CM

## 2015-05-22 MED ORDER — PRAVASTATIN SODIUM 10 MG PO TABS: ORAL_TABLET | ORAL | Status: DC

## 2015-05-22 NOTE — Progress Notes (Signed)
Pre-visit discussion using our clinic review tool. No additional management support is needed unless otherwise documented below in the visit note.  

## 2015-05-22 NOTE — Progress Notes (Signed)
[name], female   DOB: 05/13/1945, 70 y.o.   MRN: [id number]   Subjective:    [name], female    DOB: [birth year], 70 y.o.   MRN: [id number]  HPI  Patient here as a work in with concerns regarding some increased left hip and left lower back pain.  States has had some issues for a while, but has worsened recently.  Worse when lying on left side and sitting.  Walking on the treadmill makes worse.  She denies any injury or trauma.  Does report bilateral leg aching.  No pain radiating down her leg.  No numbness or tingling.  Taking tylenol arthritis.  Breathing stable.     Past Medical History  Diagnosis Date  . Scoliosis   . Depression   . Hypertension   . Asthma   . COPD (chronic obstructive pulmonary disease) ([place])   . Ulcer   . Bronchitis   . Gastritis   . Arthritis   . Chronic headaches   . GERD (gastroesophageal reflux disease)    Past Surgical History  Procedure Laterality Date  . Lumbar disc surgery  1998    [facility]  . Cholecystectomy  1996  . Partial hysterectomy  1981    prolapsed uterus  . Tubal ligation  1978  . Dilation and curettage of uterus  1971  . Abdominal hysterectomy     Family History  Problem Relation Age of Onset  . Stroke Mother   . CVA Maternal Grandmother   . Breast cancer Neg Hx   . Colon cancer Neg Hx    Social History   Social History  . Marital Status: Married    Spouse Name: N/A  . Number of Children: 2  . Years of Education: N/A   Occupational History  . retired Pharmacist, hospital    Social History Main Topics  . Smoking status: Never Smoker   . Smokeless tobacco: Never Used  . Alcohol Use: No  . Drug Use: No  . Sexual Activity: Not Asked   Other Topics Concern  . None   Social History Narrative    Outpatient Encounter Prescriptions as of 05/22/2015  Medication Sig  . citalopram (CELEXA) 40 MG tablet Take 1 tablet (40 mg total) by mouth daily.  . clonazePAM (KLONOPIN) 0.5 MG tablet Take 1/2 - 1 tablet q  day prn  . fluticasone (FLONASE) 50 MCG/ACT nasal spray Place 2 sprays into the nose daily as needed.  . hydrochlorothiazide (HYDRODIURIL) 25 MG tablet Take 1 tablet (25 mg total) by mouth daily.  Marland Kitchen ipratropium (ATROVENT) 0.02 % nebulizer solution Take 2.5 mLs (0.5 mg total) by nebulization 4 (four) times daily.  Marland Kitchen levalbuterol (XOPENEX HFA) 45 MCG/ACT inhaler Inhale 1-2 puffs into the lungs every 6 (six) hours as needed.  . pantoprazole (PROTONIX) 40 MG tablet Take 1 tablet (40 mg total) by mouth daily.  . pravastatin (PRAVACHOL) 10 MG tablet Take 1 tablet (10 mg total) by mouth daily.  . Probiotic Product (PROBIOTIC DAILY PO) Take by mouth.  . ranitidine (ZANTAC) 150 MG tablet Take 150 mg by mouth at bedtime.  Marland Kitchen tiotropium (SPIRIVA) 18 MCG inhalation capsule Place 1 capsule (18 mcg total) into inhaler and inhale daily.  . [DISCONTINUED] diclofenac sodium (VOLTAREN) 1 % GEL   . [DISCONTINUED] oxyCODONE-acetaminophen (PERCOCET) 5-325 MG per tablet Take one tablet every 4-6 hours prn pain  . [DISCONTINUED] benzonatate (TESSALON) 100 MG capsule Take 1 capsule (100 mg total) by mouth 3 (  three) times daily as needed for cough.  . [DISCONTINUED] cefdinir (OMNICEF) 300 MG capsule Take 1 capsule (300 mg total) by mouth 2 (two) times daily.  . [DISCONTINUED] predniSONE (DELTASONE) 10 MG tablet Take 6 tablets x 1 day and then decrease by 1/2 tablet per day until down to zero mg.   No facility-administered encounter medications on file as of 05/22/2015.    Outpatient Encounter Prescriptions as of 05/22/2015  Medication Sig  . citalopram (CELEXA) 40 MG tablet Take 1 tablet (40 mg total) by mouth daily.  . clonazePAM (KLONOPIN) 0.5 MG tablet Take 1/2 - 1 tablet q day prn  . fluticasone (FLONASE) 50 MCG/ACT nasal spray Place 2 sprays into the nose daily as needed.  . hydrochlorothiazide (HYDRODIURIL) 25 MG tablet Take 1 tablet (25 mg total) by mouth daily.  Marland Kitchen ipratropium (ATROVENT) 0.02 % nebulizer solution  Take 2.5 mLs (0.5 mg total) by nebulization 4 (four) times daily.  Marland Kitchen levalbuterol (XOPENEX HFA) 45 MCG/ACT inhaler Inhale 1-2 puffs into the lungs every 6 (six) hours as needed.  . pantoprazole (PROTONIX) 40 MG tablet Take 1 tablet (40 mg total) by mouth daily.  . pravastatin (PRAVACHOL) 10 MG tablet Take 1 tablet (10 mg total) by mouth daily.  . Probiotic Product (PROBIOTIC DAILY PO) Take by mouth.  . ranitidine (ZANTAC) 150 MG tablet Take 150 mg by mouth at bedtime.  Marland Kitchen tiotropium (SPIRIVA) 18 MCG inhalation capsule Place 1 capsule (18 mcg total) into inhaler and inhale daily.  . [DISCONTINUED] diclofenac sodium (VOLTAREN) 1 % GEL   . [DISCONTINUED] oxyCODONE-acetaminophen (PERCOCET) 5-325 MG per tablet Take one tablet every 4-6 hours prn pain  . [DISCONTINUED] benzonatate (TESSALON) 100 MG capsule Take 1 capsule (100 mg total) by mouth 3 (three) times daily as needed for cough.  . [DISCONTINUED] cefdinir (OMNICEF) 300 MG capsule Take 1 capsule (300 mg total) by mouth 2 (two) times daily.  . [DISCONTINUED] predniSONE (DELTASONE) 10 MG tablet Take 6 tablets x 1 day and then decrease by 1/2 tablet per day until down to zero mg.   No facility-administered encounter medications on file as of 05/22/2015.    Review of Systems  Respiratory: Negative for cough and shortness of breath.   Cardiovascular: Negative for chest pain and leg swelling.  Gastrointestinal: Negative for nausea, vomiting and diarrhea.  Genitourinary: Negative for dysuria and difficulty urinating.  Musculoskeletal: Positive for back pain.       Left lower back pain and left hip pain.   Skin: Negative for color change and rash.  Psychiatric/Behavioral: Negative for dysphoric mood and agitation.       Objective:    Physical Exam  Constitutional: She appears well-developed and well-nourished. No distress.  HENT:  Nose: Nose normal.  Mouth/Throat: Oropharynx is clear and moist.  Neck: Neck supple. No thyromegaly present.    Cardiovascular: Normal rate and regular rhythm.   DP pulses palpable and equal bilaterally.   Pulmonary/Chest: Breath sounds normal. No respiratory distress. She has no wheezes.  Abdominal: Soft. Bowel sounds are normal. There is no tenderness.  Musculoskeletal: She exhibits no edema or tenderness.  Increased pain - straight leg raise - some pulling left lower back.    Lymphadenopathy:    She has no cervical adenopathy.  Skin: No rash noted. No erythema.    BP 110/70 mmHg  Pulse 81  Temp(Src) 97.9 F (36.6 C) (Oral)  Resp 18  Ht 5' 1"  (1.549 m)  Wt 166 lb (75.297 kg)  BMI 31.38  kg/m2  SpO2 93% Wt Readings from Last 3 Encounters:  05/22/15 166 lb (75.297 kg)  02/26/15 166 lb (75.297 kg)  11/08/14 166 lb (75.297 kg)     Lab Results  Component Value Date   WBC 7.0 06/15/2013   HGB 13.9 06/15/2013   HCT 41.1 06/15/2013   PLT 232.0 06/15/2013   GLUCOSE 103* 05/18/2014   CHOL 176 05/18/2014   TRIG 159.0* 05/18/2014   HDL 59.00 05/18/2014   LDLDIRECT 148.4 11/03/2012   LDLCALC 85 05/18/2014   ALT 19 05/18/2014   AST 23 05/18/2014   NA 138 05/18/2014   K 3.8 05/18/2014   CL 100 05/18/2014   CREATININE 0.70 11/08/2014   BUN 12 05/18/2014   CO2 32 05/18/2014   TSH 0.64 06/26/2013   HGBA1C 5.8 06/26/2013    Dg Chest 2 View  02/26/2015  CLINICAL DATA:  Productive cough for 3 days, fever EXAM: CHEST  2 VIEW COMPARISON:  Chest x-ray of 08/15/2014 and CT chest of 11/08/2014 FINDINGS: No active infiltrate or effusion is seen. Heart size appears stable. Severe thoracolumbar scoliosis is again noted with associated degenerative change. IMPRESSION: No change in severe thoracolumbar scoliosis with degenerative change. No active lung disease. Electronically Signed   By: Ivar Drape M.D.   On: 02/26/2015 14:42       Assessment & Plan:   Problem List Items Addressed This Visit    Asthma    Breathing stable.       Essential hypertension, benign - Primary    Blood pressure  under good control.  Continue same medication regimen.  Follow pressures.  Follow metabolic panel.        Hypercholesterolemia    Check fasting lipid profile.       Hyperglycemia    Check met b and a1c.  Overdue labs.       Left hip pain    Obtain L-S spine xray and left hip xray.        Relevant Orders   DG Lumbar Spine 2-3 Views (Completed)   DG HIP UNILAT WITH PELVIS 2-3 VIEWS LEFT (Completed)   Left low back pain    Left lower back pain and left hip pain as outlined.  Check L-S spine xray and left hip xray.  Continue tylenol.  Unable to take antiinflammatories.       Relevant Orders   DG Lumbar Spine 2-3 Views (Completed)   DG HIP UNILAT WITH PELVIS 2-3 VIEWS LEFT (Completed)    Other Visit Diagnoses    Screening breast examination        Relevant Orders    MM DIGITAL SCREENING BILATERAL        [name], MD

## 2015-05-22 NOTE — Telephone Encounter (Signed)
Refilled pravastatin #30 with no refills.   Note - needs labs prior to next refill.  Pt appt today.

## 2015-05-23 ENCOUNTER — Encounter: Payer: Self-pay | Admitting: Internal Medicine

## 2015-05-23 NOTE — Assessment & Plan Note (Signed)
Obtain L-S spine xray and left hip xray.

## 2015-05-23 NOTE — Assessment & Plan Note (Signed)
Blood pressure under good control.  Continue same medication regimen.  Follow pressures.  Follow metabolic panel.   

## 2015-05-23 NOTE — Assessment & Plan Note (Signed)
Check met b and a1c.  Overdue labs.

## 2015-05-23 NOTE — Assessment & Plan Note (Signed)
Left lower back pain and left hip pain as outlined.  Check L-S spine xray and left hip xray.  Continue tylenol.  Unable to take antiinflammatories.

## 2015-05-23 NOTE — Assessment & Plan Note (Signed)
Breathing stable.

## 2015-05-23 NOTE — Assessment & Plan Note (Signed)
- 

## 2015-05-27 ENCOUNTER — Telehealth: Payer: Self-pay | Admitting: *Deleted

## 2015-05-27 ENCOUNTER — Other Ambulatory Visit: Payer: Self-pay | Admitting: Internal Medicine

## 2015-05-27 DIAGNOSIS — M545 Low back pain, unspecified: Secondary | ICD-10-CM

## 2015-05-27 DIAGNOSIS — M25552 Pain in left hip: Secondary | ICD-10-CM

## 2015-05-27 NOTE — Progress Notes (Unsigned)
Order placed for physical therapy referral.  Prefer either [facility] or [facility] physical therapy

## 2015-05-27 NOTE — Telephone Encounter (Signed)
Patient stated that she was under the impression, that she was to start therapy, however she has not had a call back please advise

## 2015-05-27 NOTE — Telephone Encounter (Signed)
Referral was okayed by patient in Xray result note.  Please advise and order if approved. thanks

## 2015-06-04 ENCOUNTER — Ambulatory Visit: Payer: Medicare PPO | Attending: Internal Medicine | Admitting: Physical Therapy

## 2015-06-04 ENCOUNTER — Encounter: Payer: Self-pay | Admitting: Physical Therapy

## 2015-06-04 DIAGNOSIS — R262 Difficulty in walking, not elsewhere classified: Secondary | ICD-10-CM | POA: Insufficient documentation

## 2015-06-04 DIAGNOSIS — M6281 Muscle weakness (generalized): Secondary | ICD-10-CM | POA: Diagnosis present

## 2015-06-04 DIAGNOSIS — M25552 Pain in left hip: Secondary | ICD-10-CM

## 2015-06-04 DIAGNOSIS — M545 Low back pain, unspecified: Secondary | ICD-10-CM

## 2015-06-05 NOTE — Therapy (Deleted)
[facility] [address] [place], [place], [address] Phone: [phone]   Fax:  [phone]  Physical Therapy Evaluation  Patient Details  Name: Jennifer [name] MRN: [id number] Date of Birth: [birth year] Referring Provider: [name]  Encounter Date: 06/04/2015      PT End of Session - 06/05/15 1907    Visit Number 1   Number of Visits 8   Date for PT Re-Evaluation 07/02/15   Authorization - Visit Number 1   Authorization - Number of Visits 10   PT Start Time 1302   PT Stop Time 1401   PT Time Calculation (min) 59 min   Activity Tolerance Patient tolerated treatment well   Behavior During Therapy [facility] for tasks assessed/performed      Past Medical History  Diagnosis Date  . Scoliosis   . Depression   . Hypertension   . Asthma   . COPD (chronic obstructive pulmonary disease) ([place])   . Ulcer   . Bronchitis   . Gastritis   . Arthritis   . Chronic headaches   . GERD (gastroesophageal reflux disease)     Past Surgical History  Procedure Laterality Date  . Lumbar disc surgery  1998    [facility]  . Cholecystectomy  1996  . Partial hysterectomy  1981    prolapsed uterus  . Tubal ligation  1978  . Dilation and curettage of uterus  1971  . Abdominal hysterectomy      There were no vitals filed for this visit.       Subjective Assessment - 06/05/15 1906    Subjective Pt. reports no back pain at this time.  Pt. states she is a left side sleeper but has increase L hip pain.  Pt. reports pain has progressively worsened since R foot fx (8/16).   Limitations Lifting;Standing;Walking;House hold activities   Patient Stated Goals Increase LE muscle strength/ decrease hip and back pain.    Currently in Pain? No/denies                Plan - 06/05/15 1908    Clinical Impression Statement Pt. is a pleasant 70 y/o female with severe thoracolumbar scoliosis and chronic h/o L hip pain since R foot fracture (08/2014).  Pt.  currently reports no back pain but 2/10 L hip discomfort with resisted L hip flexion/ L side positioning on mat table.  B LE AROM WNL and LE strength grossly 4/5 MMT (generalized muscle weakness/ deconditioning).  Difficulty assessing LLD secondary to severity of thoracolumbar scoliosis/ posture.  Significant B lumbar paraspinal muscle tightness/ hypomobility due to scoliosis.   Pt. will benefit from skilled PT services to develop ex. program to increase B LE/ core muscle  strengthening and posture correction to improve pain-free mobility.    Rehab Potential Fair   PT Frequency 2x / week   PT Duration 4 weeks   PT Treatment/Interventions ADLs/Self Care Home Management;Cryotherapy;Balance training;Therapeutic exercise;Therapeutic activities;Functional mobility training;Stair training;Gait training;Neuromuscular re-education;Patient/family education;Manual techniques;Energy conservation;Passive range of motion   PT Next Visit Plan Review/ progress HEP.    PT Home Exercise Plan Core ex. program/ [employer].    Consulted and Agree with Plan of Care Patient      Patient will benefit from skilled therapeutic intervention in order to improve the following deficits and impairments:  Decreased endurance, Hypomobility, Pain, Decreased strength, Decreased mobility, Difficulty walking, Decreased range of motion, Improper body mechanics  Visit Diagnosis: Left-sided low back pain without sciatica  Muscle weakness (  generalized)  Pain in left hip  Difficulty in walking, not elsewhere classified     Problem List Patient Active Problem List   Diagnosis Date Noted  . Left low back pain 05/22/2015  . Left hip pain 05/22/2015  . SOB (shortness of breath) 11/11/2014  . Right shoulder pain 07/16/2014  . Health care maintenance 06/03/2014  . Arm skin lesion, left 06/18/2013  . Conjunctivitis 06/07/2013  . URI (upper respiratory infection) 03/03/2013  . Hyperglycemia 11/12/2012  . Hypercholesterolemia  11/12/2012  . Essential hypertension, benign 05/12/2012  . GERD (gastroesophageal reflux disease) 05/12/2012  . Gastritis 05/12/2012  . History of colonic polyps 05/12/2012  . Asthma 12/13/2011  . Chronic restrictive lung disease 05/11/2011   Pura Spice, PT, DPT # [id number]   06/06/2015, 6:17 PM   [facility] [address] [place], [place], [address] Phone: [phone]   Fax:  [phone]  Name: Jennifer [name] MRN: [id number] Date of Birth: 10-11-1945

## 2015-06-06 NOTE — Addendum Note (Signed)
Addended by: Pura Spice on: 06/06/2015 06:20 PM   Modules accepted: Orders

## 2015-06-06 NOTE — Therapy (Signed)
[facility] [address] [place], [place], [address] Phone: [phone]   Fax:  [phone]  Physical Therapy Evaluation  Patient Details  Name: Jennifer [name] MRN: [id number] Date of Birth: 12/24/1945 Referring Provider: [name]  Encounter Date: 06/04/2015      PT End of Session - 06/05/15 1907    Visit Number 1   Number of Visits 8   Date for PT Re-Evaluation 07/02/15   Authorization - Visit Number 1   Authorization - Number of Visits 10   PT Start Time 1302   PT Stop Time 1401   PT Time Calculation (min) 59 min   Activity Tolerance Patient tolerated treatment well   Behavior During Therapy [facility] for tasks assessed/performed      Past Medical History  Diagnosis Date  . Scoliosis   . Depression   . Hypertension   . Asthma   . COPD (chronic obstructive pulmonary disease) ([place])   . Ulcer   . Bronchitis   . Gastritis   . Arthritis   . Chronic headaches   . GERD (gastroesophageal reflux disease)     Past Surgical History  Procedure Laterality Date  . Lumbar disc surgery  1998    [facility]  . Cholecystectomy  1996  . Partial hysterectomy  1981    prolapsed uterus  . Tubal ligation  1978  . Dilation and curettage of uterus  1971  . Abdominal hysterectomy      There were no vitals filed for this visit.       Subjective Assessment - 06/05/15 1906    Subjective Pt. reports no back pain at this time.  Pt. states she is a left side sleeper but has increase L hip pain.  Pt. reports pain has progressively worsened since R foot fx (8/16).   Limitations Lifting;Standing;Walking;House hold activities   Patient Stated Goals Increase LE muscle strength/ decrease hip and back pain.    Currently in Pain? No/denies       See HEP.  Core ex. And clamshells. Good technique.         PT Education - 06/06/15 1804    Education provided Yes   Education Details Issued core ex. program/ clamshells.    Person(s) Educated  Patient   Methods Explanation;Demonstration;Handout   Comprehension Verbalized understanding;Returned demonstration             PT Long Term Goals - 06/06/15 1813    PT LONG TERM GOAL #1   Title Pt. I with HEP to increase B LE/ core muscle strengthening to 4+/5 MMT to improve posture/ pain-free mobility.     Baseline B LE muscle strength 4/5 MMT.  Difficulty maintaining core muscle activiation.   Time 4   Period Weeks   Status New   PT LONG TERM GOAL #2   Title Pt. will decrease Modified Oswestry Low Back Pain Questionnaire to <20% to improve pain-free mobility.     Baseline MODI: 36% self perceived moderate disability   Time 4   Period Weeks   Status New   PT LONG TERM GOAL #3   Title Pt. will report no L hip pain with L sidelying at night to improve sleeping posture with use of CPAP machine.    Baseline Unable to sleep on L side due to L hip pain.    Time 4   Period Weeks   Status New   PT LONG TERM GOAL #4   Title Pt. able to participate  with 30 minute ther. ex. program to transition to Silver Sneakers to improve overall functional mobility/ upright posture.     Baseline Limited ex. program.    Time 4   Period Weeks   Status New            Plan - 06/05/15 1908    Clinical Impression Statement Pt. is a pleasant 70 y/o female with severe thoracolumbar scoliosis and chronic h/o L hip pain since R foot fracture (08/2014).  Pt. currently reports no back pain but 2/10 L hip discomfort with resisted L hip flexion/ L side positioning on mat table.  B LE AROM WNL and LE strength grossly 4/5 MMT (generalized muscle weakness/ deconditioning).  Difficulty assessing LLD secondary to severity of thoracolumbar scoliosis/ posture.  Significant B lumbar paraspinal muscle tightness/ hypomobility due to scoliosis.   Pt. will benefit from skilled PT services to develop ex. program to increase B LE/ core muscle  strengthening and posture correction to improve pain-free mobility.    Rehab  Potential Fair   PT Frequency 2x / week   PT Duration 4 weeks   PT Treatment/Interventions ADLs/Self Care Home Management;Cryotherapy;Balance training;Therapeutic exercise;Therapeutic activities;Functional mobility training;Stair training;Gait training;Neuromuscular re-education;Patient/family education;Manual techniques;Energy conservation;Passive range of motion   PT Next Visit Plan Review/ progress HEP.    PT Home Exercise Plan Core ex. program/ Clamshells.    Consulted and Agree with Plan of Care Patient      Patient will benefit from skilled therapeutic intervention in order to improve the following deficits and impairments:  Decreased endurance, Hypomobility, Pain, Decreased strength, Decreased mobility, Difficulty walking, Decreased range of motion, Improper body mechanics  Visit Diagnosis: Left-sided low back pain without sciatica  Muscle weakness (generalized)  Pain in left hip  Difficulty in walking, not elsewhere classified     Problem List Patient Active Problem List   Diagnosis Date Noted  . Left low back pain 05/22/2015  . Left hip pain 05/22/2015  . SOB (shortness of breath) 11/11/2014  . Right shoulder pain 07/16/2014  . Health care maintenance 06/03/2014  . Arm skin lesion, left 06/18/2013  . Conjunctivitis 06/07/2013  . URI (upper respiratory infection) 03/03/2013  . Hyperglycemia 11/12/2012  . Hypercholesterolemia 11/12/2012  . Essential hypertension, benign 05/12/2012  . GERD (gastroesophageal reflux disease) 05/12/2012  . Gastritis 05/12/2012  . History of colonic polyps 05/12/2012  . Asthma 12/13/2011  . Chronic restrictive lung disease 05/11/2011   Pura Spice, PT, DPT # [phone]   06/06/2015, 6:18 PM  [place] [facility] [address] [place], [place], [address] Phone: [phone]   Fax:  [phone]  Name: Jennifer [name] MRN: [id number] Date of Birth: 03-Sep-1945

## 2015-06-10 ENCOUNTER — Ambulatory Visit: Payer: Medicare PPO | Admitting: Physical Therapy

## 2015-06-10 ENCOUNTER — Encounter: Payer: Self-pay | Admitting: Physical Therapy

## 2015-06-10 DIAGNOSIS — M545 Low back pain, unspecified: Secondary | ICD-10-CM

## 2015-06-10 DIAGNOSIS — M25552 Pain in left hip: Secondary | ICD-10-CM

## 2015-06-10 DIAGNOSIS — M6281 Muscle weakness (generalized): Secondary | ICD-10-CM

## 2015-06-10 DIAGNOSIS — R262 Difficulty in walking, not elsewhere classified: Secondary | ICD-10-CM

## 2015-06-10 NOTE — Therapy (Signed)
[facility] [address] [place], [place], [address] Phone: [phone]   Fax:  [phone]  Physical Therapy Treatment  Patient Details  Name: Jennifer [name] MRN: [id number] Date of Birth: December 12, 1945 Referring Provider: [name]  Encounter Date: 06/10/2015      PT End of Session - 06/11/15 1746    Visit Number 2   Number of Visits 8   Date for PT Re-Evaluation 07/02/15   Authorization - Visit Number 2   Authorization - Number of Visits 10   PT Start Time 1100   PT Stop Time 1151   PT Time Calculation (min) 51 min   Activity Tolerance Patient tolerated treatment well   Behavior During Therapy [facility] for tasks assessed/performed      Past Medical History  Diagnosis Date  . Scoliosis   . Depression   . Hypertension   . Asthma   . COPD (chronic obstructive pulmonary disease) ([place])   . Ulcer   . Bronchitis   . Gastritis   . Arthritis   . Chronic headaches   . GERD (gastroesophageal reflux disease)     Past Surgical History  Procedure Laterality Date  . Lumbar disc surgery  1998    [facility]  . Cholecystectomy  1996  . Partial hysterectomy  1981    prolapsed uterus  . Tubal ligation  1978  . Dilation and curettage of uterus  1971  . Abdominal hysterectomy      There were no vitals filed for this visit.      Subjective Assessment - 06/10/15 1104    Subjective Pt. reports no back pain currently.  Pt. states R foot is hurting and states it may be weather related (rain today).  Reports good compliance with HEP.     Limitations Lifting;Standing;Walking;House hold activities   Patient Stated Goals Increase LE muscle strength/ decrease hip and back pain.    Currently in Pain? No/denies      OBJECTIVE:  Nustep L6 10 min. B UE/LE (cuing for TrA muscle contraction).  Supine LE/lumbar stretches (generalized)- 7 min.  Supine B hip abd. And hip flexion resisted With GTB 20x/ supine hip adduction with pink ball 10x 5 sec.  Holds.  Supine bridging 10x2 (maintain hip position).  TG knee flexion (posture correction)- 20x2/ heel raises 20x.  Discussed/ reviewed HEP.  Seated ball ex.:  Wt. Shifting/ pelvic tilts/ marching/ LAQ/ scap. Retraction with RTB 20x each.  Discussed HEP.     Pt response for medical necessity: pt. Benefits from core stability ex. Program with focus on posture correction to improve pain-free mobility.  No back pain during tx. Session.     Good technique with core stability ex. program/ progression to seated ball ex.  Pt. initially required CGA/min. A with seated ball ex. program but able to progress to mod. I with seated ball ex/ TrA muscle contration.  Mod. A t/o tx. session to correct upright posture.  No increase c/o pain during tx. session.         PT Long Term Goals - 06/06/15 1813    PT LONG TERM GOAL #1   Title Pt. I with HEP to increase B LE/ core muscle strengthening to 4+/5 MMT to improve posture/ pain-free mobility.     Baseline B LE muscle strength 4/5 MMT.  Difficulty maintaining core muscle activiation.   Time 4   Period Weeks   Status New   PT LONG TERM GOAL #2   Title Pt.  will decrease Modified Oswestry Low Back Pain Questionnaire to <20% to improve pain-free mobility.     Baseline MODI: 36% self perceived moderate disability   Time 4   Period Weeks   Status New   PT LONG TERM GOAL #3   Title Pt. will report no L hip pain with L sidelying at night to improve sleeping posture with use of CPAP machine.    Baseline Unable to sleep on L side due to L hip pain.    Time 4   Period Weeks   Status New   PT LONG TERM GOAL #4   Title Pt. able to participate with 30 minute ther. ex. program to transition to Silver Sneakers to improve overall functional mobility/ upright posture.     Baseline Limited ex. program.    Time 4   Period Weeks   Status New       Patient will benefit from skilled therapeutic intervention in order to improve the following deficits and impairments:   Decreased endurance, Hypomobility, Pain, Decreased strength, Decreased mobility, Difficulty walking, Decreased range of motion, Improper body mechanics  Visit Diagnosis: Left-sided low back pain without sciatica  Muscle weakness (generalized)  Pain in left hip  Difficulty in walking, not elsewhere classified     Problem List Patient Active Problem List   Diagnosis Date Noted  . Left low back pain 05/22/2015  . Left hip pain 05/22/2015  . SOB (shortness of breath) 11/11/2014  . Right shoulder pain 07/16/2014  . Health care maintenance 06/03/2014  . Arm skin lesion, left 06/18/2013  . Conjunctivitis 06/07/2013  . URI (upper respiratory infection) 03/03/2013  . Hyperglycemia 11/12/2012  . Hypercholesterolemia 11/12/2012  . Essential hypertension, benign 05/12/2012  . GERD (gastroesophageal reflux disease) 05/12/2012  . Gastritis 05/12/2012  . History of colonic polyps 05/12/2012  . Asthma 12/13/2011  . Chronic restrictive lung disease 05/11/2011   Pura Spice, PT, DPT # [phone]   06/11/2015, 5:54 PM  [place] [facility] [address] [place], [place], [address] Phone: [phone]   Fax:  [phone]  Name: Jennifer [name] MRN: [id number] Date of Birth: [birth year]

## 2015-06-12 ENCOUNTER — Encounter: Payer: Medicare PPO | Admitting: Physical Therapy

## 2015-06-14 ENCOUNTER — Other Ambulatory Visit: Payer: Self-pay | Admitting: Internal Medicine

## 2015-06-14 ENCOUNTER — Other Ambulatory Visit (INDEPENDENT_AMBULATORY_CARE_PROVIDER_SITE_OTHER): Payer: Medicare PPO

## 2015-06-14 ENCOUNTER — Telehealth: Payer: Self-pay | Admitting: *Deleted

## 2015-06-14 DIAGNOSIS — E7800 Pure hypercholesterolemia, unspecified: Secondary | ICD-10-CM | POA: Diagnosis not present

## 2015-06-14 DIAGNOSIS — I10 Essential (primary) hypertension: Secondary | ICD-10-CM

## 2015-06-14 DIAGNOSIS — R739 Hyperglycemia, unspecified: Secondary | ICD-10-CM

## 2015-06-14 LAB — BASIC METABOLIC PANEL
BUN: 16 mg/dL (ref 6–23)
CALCIUM: 9.6 mg/dL (ref 8.4–10.5)
CO2: 32 meq/L (ref 19–32)
CREATININE: 0.67 mg/dL (ref 0.40–1.20)
Chloride: 102 mEq/L (ref 96–112)
GFR: 92.92 mL/min (ref >60.00)
Glucose, Bld: 107 mg/dL — ABNORMAL HIGH (ref 70–99)
Potassium: 3.8 mEq/L (ref 3.5–5.1)
SODIUM: 140 meq/L (ref 135–145)

## 2015-06-14 LAB — LIPID PANEL
CHOLESTEROL: 197 mg/dL (ref 0–200)
HDL: 54.1 mg/dL (ref >39.00)
LDL CALC: 118 mg/dL — AB (ref 0–99)
NonHDL: 143.09
TRIGLYCERIDES: 123 mg/dL (ref 0.0–149.0)
Total CHOL/HDL Ratio: 4
VLDL: 24.6 mg/dL (ref 0.0–40.0)

## 2015-06-14 LAB — CBC WITH DIFFERENTIAL/PLATELET
BASOS ABS: 0.1 10*3/uL (ref 0.0–0.1)
BASOS PCT: 1.1 % (ref 0.0–3.0)
EOS ABS: 0.5 10*3/uL (ref 0.0–0.7)
Eosinophils Relative: 6.1 % — ABNORMAL HIGH (ref 0.0–5.0)
HEMATOCRIT: 42.2 % (ref 36.0–46.0)
HEMOGLOBIN: 14.1 g/dL (ref 12.0–15.0)
LYMPHS PCT: 34.7 % (ref 12.0–46.0)
Lymphs Abs: 2.8 10*3/uL (ref 0.7–4.0)
MCHC: 33.3 g/dL (ref 30.0–36.0)
MCV: 93.7 fl (ref 78.0–100.0)
MONO ABS: 0.6 10*3/uL (ref 0.1–1.0)
Monocytes Relative: 7.5 % (ref 3.0–12.0)
Neutro Abs: 4 10*3/uL (ref 1.4–7.7)
Neutrophils Relative %: 50.6 % (ref 43.0–77.0)
Platelets: 230 10*3/uL (ref 150.0–400.0)
RBC: 4.51 Mil/uL (ref 3.87–5.11)
RDW: 13.4 % (ref 11.5–15.5)
WBC: 7.9 10*3/uL (ref 4.0–10.5)

## 2015-06-14 LAB — HEPATIC FUNCTION PANEL
ALT: 15 U/L (ref 0–35)
AST: 19 U/L (ref 0–37)
Albumin: 4.4 g/dL (ref 3.5–5.2)
Alkaline Phosphatase: 61 U/L (ref 39–117)
BILIRUBIN TOTAL: 0.5 mg/dL (ref 0.2–1.2)
Bilirubin, Direct: 0.1 mg/dL (ref 0.0–0.3)
TOTAL PROTEIN: 6.5 g/dL (ref 6.0–8.3)

## 2015-06-14 LAB — HEMOGLOBIN A1C: HEMOGLOBIN A1C: 5.8 % (ref 4.6–6.5)

## 2015-06-14 LAB — TSH: TSH: 1.04 u[IU]/mL (ref 0.35–4.50)

## 2015-06-14 NOTE — Progress Notes (Unsigned)
Orders placed for labs

## 2015-06-14 NOTE — Telephone Encounter (Signed)
Orders placed for labs

## 2015-06-14 NOTE — Telephone Encounter (Signed)
Labs and dx?  

## 2015-06-18 ENCOUNTER — Encounter: Payer: Medicare PPO | Admitting: Physical Therapy

## 2015-06-18 ENCOUNTER — Ambulatory Visit: Payer: Medicare PPO | Admitting: Physical Therapy

## 2015-06-18 ENCOUNTER — Encounter: Payer: Self-pay | Admitting: *Deleted

## 2015-06-18 ENCOUNTER — Other Ambulatory Visit: Payer: Self-pay | Admitting: Internal Medicine

## 2015-06-18 DIAGNOSIS — R262 Difficulty in walking, not elsewhere classified: Secondary | ICD-10-CM

## 2015-06-18 DIAGNOSIS — M25552 Pain in left hip: Secondary | ICD-10-CM

## 2015-06-18 DIAGNOSIS — M545 Low back pain, unspecified: Secondary | ICD-10-CM

## 2015-06-18 DIAGNOSIS — M6281 Muscle weakness (generalized): Secondary | ICD-10-CM

## 2015-06-18 NOTE — Therapy (Signed)
[facility] [address] [place], [place], [address] Phone: [phone]   Fax:  [phone]  Physical Therapy Treatment  Patient Details  Name: Jennifer [name] MRN: [id number] Date of Birth: [birth year] Referring Provider: [name]  Encounter Date: 06/18/2015      PT End of Session - 06/18/15 1231    Visit Number 3   Number of Visits 8   Date for PT Re-Evaluation 07/02/15   Authorization - Visit Number 3   Authorization - Number of Visits 10   PT Start Time [id number]   PT Stop Time 1203   PT Time Calculation (min) 49 min   Activity Tolerance Patient tolerated treatment well   Behavior During Therapy [facility] for tasks assessed/performed      Past Medical History  Diagnosis Date  . Scoliosis   . Depression   . Hypertension   . Asthma   . COPD (chronic obstructive pulmonary disease) ([place])   . Ulcer   . Bronchitis   . Gastritis   . Arthritis   . Chronic headaches   . GERD (gastroesophageal reflux disease)     Past Surgical History  Procedure Laterality Date  . Lumbar disc surgery  1998    [facility]  . Cholecystectomy  1996  . Partial hysterectomy  1981    prolapsed uterus  . Tubal ligation  1978  . Dilation and curettage of uterus  1971  . Abdominal hysterectomy      There were no vitals filed for this visit.      Subjective Assessment - 06/18/15 1225    Subjective Pt. states she cancelled last PT appt. due to severe migraine.  Pt. states her head is doing better and she bought a ball for core ex. program.  Pt. states ball may be to small and will look for a 65 cm ball.  Pt. reports good compliance with HEP.    Limitations Lifting;Standing;Walking;House hold activities   Patient Stated Goals Increase LE muscle strength/ decrease hip and back pain.    Currently in Pain? No/denies       OBJECTIVE: Nustep L6 10 min. B UE/LE (cuing for TrA muscle contraction)- "feels good". Supine LE/lumbar stretches (generalized)- 10  min.  There.ex: Issued ball ex. Program (supine/ seated/ wall squats)- see handouts.  Supine TrA ex. With hip abd./ SLR with tactile cuing for TrA muscle contraction.  Bed mobility with instruction/ core muscle contraction.    Pt response for medical necessity: pt. Benefits from core stability ex. Program with focus on posture correction to improve pain-free mobility. No back pain during tx. Session.        PT Long Term Goals - 06/06/15 1813    PT LONG TERM GOAL #1   Title Pt. I with HEP to increase B LE/ core muscle strengthening to 4+/5 MMT to improve posture/ pain-free mobility.     Baseline B LE muscle strength 4/5 MMT.  Difficulty maintaining core muscle activiation.   Time 4   Period Weeks   Status New   PT LONG TERM GOAL #2   Title Pt. will decrease Modified Oswestry Low Back Pain Questionnaire to <20% to improve pain-free mobility.     Baseline MODI: 36% self perceived moderate disability   Time 4   Period Weeks   Status New   PT LONG TERM GOAL #3   Title Pt. will report no L hip pain with L sidelying at night to improve sleeping posture with use of CPAP  machine.    Baseline Unable to sleep on L side due to L hip pain.    Time 4   Period Weeks   Status New   PT LONG TERM GOAL #4   Title Pt. able to participate with 30 minute ther. ex. program to transition to Silver Sneakers to improve overall functional mobility/ upright posture.     Baseline Limited ex. program.    Time 4   Period Weeks   Status New               Plan - 06/18/15 1231    Clinical Impression Statement Progressing well with core stability ex. with less verbal/tactile cuing required.  Pt. presents with good B hip/LE flexibility in supine position and maintaing core stability with position changes/ ther.ex.  PT educated pt. on Silver Sneakers ex. program (waiting unitl August).     Rehab Potential Fair   PT Frequency 2x / week   PT Duration 4 weeks   PT Treatment/Interventions  ADLs/Self Care Home Management;Cryotherapy;Balance training;Therapeutic exercise;Therapeutic activities;Functional mobility training;Stair training;Gait training;Neuromuscular re-education;Patient/family education;Manual techniques;Energy conservation;Passive range of motion   PT Next Visit Plan Review/ progress HEP.   Check goals.     PT Home Exercise Plan Core ex. program/ Clamshells.  Ball ex.   Consulted and Agree with Plan of Care Patient      Patient will benefit from skilled therapeutic intervention in order to improve the following deficits and impairments:  Decreased endurance, Hypomobility, Pain, Decreased strength, Decreased mobility, Difficulty walking, Decreased range of motion, Improper body mechanics  Visit Diagnosis: Left-sided low back pain without sciatica  Muscle weakness (generalized)  Pain in left hip  Difficulty in walking, not elsewhere classified     Problem List Patient Active Problem List   Diagnosis Date Noted  . Left low back pain 05/22/2015  . Left hip pain 05/22/2015  . SOB (shortness of breath) 11/11/2014  . Right shoulder pain 07/16/2014  . Health care maintenance 06/03/2014  . Arm skin lesion, left 06/18/2013  . Conjunctivitis 06/07/2013  . URI (upper respiratory infection) 03/03/2013  . Hyperglycemia 11/12/2012  . Hypercholesterolemia 11/12/2012  . Essential hypertension, benign 05/12/2012  . GERD (gastroesophageal reflux disease) 05/12/2012  . Gastritis 05/12/2012  . History of colonic polyps 05/12/2012  . Asthma 12/13/2011  . Chronic restrictive lung disease 05/11/2011   Pura Spice, PT, DPT # [phone]   06/18/2015, 12:37 PM  [place] [facility] [address] [place], [place], [address] Phone: [phone]   Fax:  [phone]  Name: Jennifer [name] MRN: [id number] Date of Birth: 1945-08-27

## 2015-06-20 ENCOUNTER — Ambulatory Visit: Payer: Medicare PPO | Attending: Internal Medicine | Admitting: Physical Therapy

## 2015-06-20 ENCOUNTER — Encounter: Payer: Self-pay | Admitting: Physical Therapy

## 2015-06-20 DIAGNOSIS — M6281 Muscle weakness (generalized): Secondary | ICD-10-CM | POA: Diagnosis present

## 2015-06-20 DIAGNOSIS — M545 Low back pain, unspecified: Secondary | ICD-10-CM

## 2015-06-20 DIAGNOSIS — M25552 Pain in left hip: Secondary | ICD-10-CM | POA: Diagnosis present

## 2015-06-20 DIAGNOSIS — R262 Difficulty in walking, not elsewhere classified: Secondary | ICD-10-CM | POA: Insufficient documentation

## 2015-06-20 NOTE — Therapy (Signed)
[facility] [address] [place], [place], [address] Phone: [phone]   Fax:  [phone]  Physical Therapy Treatment  Patient Details  Name: Jennifer [name] MRN: [id number] Date of Birth: 1945/12/30 Referring Provider: [name]  Encounter Date: 06/20/2015      PT End of Session - 06/20/15 1100    Visit Number 4   Number of Visits 8   Date for PT Re-Evaluation 07/02/15   Authorization - Visit Number 4   Authorization - Number of Visits 10   PT Start Time [id number]   PT Stop Time 1146   PT Time Calculation (min) 48 min   Activity Tolerance Patient tolerated treatment well   Behavior During Therapy [facility] for tasks assessed/performed      Past Medical History  Diagnosis Date  . Scoliosis   . Depression   . Hypertension   . Asthma   . COPD (chronic obstructive pulmonary disease) ([place])   . Ulcer   . Bronchitis   . Gastritis   . Arthritis   . Chronic headaches   . GERD (gastroesophageal reflux disease)     Past Surgical History  Procedure Laterality Date  . Lumbar disc surgery  1998    [facility]  . Cholecystectomy  1996  . Partial hysterectomy  1981    prolapsed uterus  . Tubal ligation  1978  . Dilation and curettage of uterus  1971  . Abdominal hysterectomy      There were no vitals filed for this visit.      Subjective Assessment - 06/20/15 1058    Subjective Pt. reports no hip or back pain at this time.  Pt. states if she sits for a while and gets up her R hip will hurt.  R shoulder sore today from playing with grandson yesterday.     Limitations Lifting;Standing;Walking;House hold activities   Patient Stated Goals Increase LE muscle strength/ decrease hip and back pain.    Currently in Pain? No/denies      OBJECTIVE: Scifit L6 10 min. B UE/LE (cuing for TrA muscle contraction). Supine/prone LE/lumbar stretches (generalized)- 9 min. There.ex: Seated ball ex. Program (supine/ seated/ wall squats)- reviewed  handouts/progressed as tolerated. Supine TrA ex. With hip abd./ SLR with tactile cuing for TrA muscle contraction. Manual tx.: prone STM to low thoracic/lumbar region (PT recommended massage therapy for managing muscle tightness).     Pt response for medical necessity: pt. Benefits from core stability ex. Program with focus on posture correction to improve pain-free mobility. No back pain during tx. Session.        PT Long Term Goals - 06/06/15 1813    PT LONG TERM GOAL #1   Title Pt. I with HEP to increase B LE/ core muscle strengthening to 4+/5 MMT to improve posture/ pain-free mobility.     Baseline B LE muscle strength 4/5 MMT.  Difficulty maintaining core muscle activiation.   Time 4   Period Weeks   Status New   PT LONG TERM GOAL #2   Title Pt. will decrease Modified Oswestry Low Back Pain Questionnaire to <20% to improve pain-free mobility.     Baseline MODI: 36% self perceived moderate disability   Time 4   Period Weeks   Status New   PT LONG TERM GOAL #3   Title Pt. will report no L hip pain with L sidelying at night to improve sleeping posture with use of CPAP machine.    Baseline Unable to  sleep on L side due to L hip pain.    Time 4   Period Weeks   Status New   PT LONG TERM GOAL #4   Title Pt. able to participate with 30 minute ther. ex. program to transition to Silver Sneakers to improve overall functional mobility/ upright posture.     Baseline Limited ex. program.    Time 4   Period Weeks   Status New       Patient will benefit from skilled therapeutic intervention in order to improve the following deficits and impairments:  Decreased endurance, Hypomobility, Pain, Decreased strength, Decreased mobility, Difficulty walking, Decreased range of motion, Improper body mechanics  Visit Diagnosis: Left-sided low back pain without sciatica  Muscle weakness (generalized)  Pain in left hip  Difficulty in walking, not elsewhere  classified     Problem List Patient Active Problem List   Diagnosis Date Noted  . Left low back pain 05/22/2015  . Left hip pain 05/22/2015  . SOB (shortness of breath) 11/11/2014  . Right shoulder pain 07/16/2014  . Health care maintenance 06/03/2014  . Arm skin lesion, left 06/18/2013  . Conjunctivitis 06/07/2013  . URI (upper respiratory infection) 03/03/2013  . Hyperglycemia 11/12/2012  . Hypercholesterolemia 11/12/2012  . Essential hypertension, benign 05/12/2012  . GERD (gastroesophageal reflux disease) 05/12/2012  . Gastritis 05/12/2012  . History of colonic polyps 05/12/2012  . Asthma 12/13/2011  . Chronic restrictive lung disease 05/11/2011   Pura Spice, PT, DPT # [phone]   06/21/2015, 4:28 PM  [facility] [address] [place], [place], [address] Phone: [phone]   Fax:  [phone]  Name: Jennifer [name] MRN: [id number] Date of Birth: Dec 14, 1945

## 2015-06-24 ENCOUNTER — Ambulatory Visit: Payer: Medicare PPO | Admitting: Physical Therapy

## 2015-06-24 ENCOUNTER — Encounter: Payer: Self-pay | Admitting: Physical Therapy

## 2015-06-24 DIAGNOSIS — M545 Low back pain, unspecified: Secondary | ICD-10-CM

## 2015-06-24 DIAGNOSIS — M25552 Pain in left hip: Secondary | ICD-10-CM

## 2015-06-24 DIAGNOSIS — M6281 Muscle weakness (generalized): Secondary | ICD-10-CM

## 2015-06-24 DIAGNOSIS — R262 Difficulty in walking, not elsewhere classified: Secondary | ICD-10-CM

## 2015-06-24 NOTE — Therapy (Signed)
[place] [facility] [address] [place], [place], [address] Phone: [phone]   Fax:  [phone]  Physical Therapy Treatment  Patient Details  Name: Jennifer [name] MRN: [id number] Date of Birth: [birth year] Referring Provider: [name]  Encounter Date: 06/24/2015      PT End of Session - 06/24/15 1124    Visit Number 5   Number of Visits 8   Date for PT Re-Evaluation 07/02/15   Authorization - Visit Number 5   Authorization - Number of Visits 10   PT Start Time [id number]   PT Stop Time 1202   PT Time Calculation (min) 48 min   Activity Tolerance Patient tolerated treatment well   Behavior During Therapy [facility] for tasks assessed/performed      Past Medical History  Diagnosis Date  . Scoliosis   . Depression   . Hypertension   . Asthma   . COPD (chronic obstructive pulmonary disease) ([place])   . Ulcer   . Bronchitis   . Gastritis   . Arthritis   . Chronic headaches   . GERD (gastroesophageal reflux disease)     Past Surgical History  Procedure Laterality Date  . Lumbar disc surgery  1998    [facility]  . Cholecystectomy  1996  . Partial hysterectomy  1981    prolapsed uterus  . Tubal ligation  1978  . Dilation and curettage of uterus  1971  . Abdominal hysterectomy      There were no vitals filed for this visit.      Subjective Assessment - 06/24/15 1113    Subjective Pt. states she had a busy weekend and is tired.  Pt. reports muscles are sore, but not hurting at this time.     Limitations Lifting;Standing;Walking;House hold activities   Patient Stated Goals Increase LE muscle strength/ decrease hip and back pain.    Currently in Pain? No/denies        OBJECTIVE: Scifit L6 12 min. B UE/LE (cuing for TrA muscle contraction).  There.ex.:  Nautilus: walk outs 30# 5x L/R.  Standing Nautilus: scap. Retraction 30#/ shoulder ext. 20#/ lat. Pull downs on ball 30# (cuing to maintain core muscle contraction t/o UE ex.).  Seated  ball ex. Program (marching/ LAQ/ alt. UE and LE).  Manual tx.: prone STM to low thoracic/lumbar region (PT issued business card for [facility] for Massage therapy).    Pt response for medical necessity: pt. Benefits from core stability ex. Program with focus on posture correction to improve pain-free mobility. No back pain during tx. Session.         PT Long Term Goals - 06/06/15 1813    PT LONG TERM GOAL #1   Title Pt. I with HEP to increase B LE/ core muscle strengthening to 4+/5 MMT to improve posture/ pain-free mobility.     Baseline B LE muscle strength 4/5 MMT.  Difficulty maintaining core muscle activiation.   Time 4   Period Weeks   Status New   PT LONG TERM GOAL #2   Title Pt. will decrease Modified Oswestry Low Back Pain Questionnaire to <20% to improve pain-free mobility.     Baseline MODI: 36% self perceived moderate disability   Time 4   Period Weeks   Status New   PT LONG TERM GOAL #3   Title Pt. will report no L hip pain with L sidelying at night to improve sleeping posture with use of CPAP machine.    Baseline Unable to  sleep on L side due to L hip pain.    Time 4   Period Weeks   Status New   PT LONG TERM GOAL #4   Title Pt. able to participate with 30 minute ther. ex. program to transition to Silver Sneakers to improve overall functional mobility/ upright posture.     Baseline Limited ex. program.    Time 4   Period Weeks   Status New            Plan - 06/24/15 1124    Clinical Impression Statement Pt. worked hard with generalized/ core strengthening ex. with no c/o pain.  Pt. able to maintain TrA muscle contraction during standing/posture ex.    Rehab Potential Fair   PT Frequency 2x / week   PT Duration 4 weeks   PT Treatment/Interventions ADLs/Self Care Home Management;Cryotherapy;Balance training;Therapeutic exercise;Therapeutic activities;Functional mobility training;Stair training;Gait training;Neuromuscular re-education;Patient/family  education;Manual techniques;Energy conservation;Passive range of motion   PT Next Visit Plan Reassess goals/ HEP next tx.   PT Home Exercise Plan Core ex. program/ Clamshells.  Ball ex.      Patient will benefit from skilled therapeutic intervention in order to improve the following deficits and impairments:  Decreased endurance, Hypomobility, Pain, Decreased strength, Decreased mobility, Difficulty walking, Decreased range of motion, Improper body mechanics  Visit Diagnosis: Left-sided low back pain without sciatica  Muscle weakness (generalized)  Pain in left hip  Difficulty in walking, not elsewhere classified     Problem List Patient Active Problem List   Diagnosis Date Noted  . Left low back pain 05/22/2015  . Left hip pain 05/22/2015  . SOB (shortness of breath) 11/11/2014  . Right shoulder pain 07/16/2014  . Health care maintenance 06/03/2014  . Arm skin lesion, left 06/18/2013  . Conjunctivitis 06/07/2013  . URI (upper respiratory infection) 03/03/2013  . Hyperglycemia 11/12/2012  . Hypercholesterolemia 11/12/2012  . Essential hypertension, benign 05/12/2012  . GERD (gastroesophageal reflux disease) 05/12/2012  . Gastritis 05/12/2012  . History of colonic polyps 05/12/2012  . Asthma 12/13/2011  . Chronic restrictive lung disease 05/11/2011   Pura Spice, PT, DPT # [phone]   06/24/2015, 5:31 PM  [place] [facility] [address] [place], [place], [address] Phone: [phone]   Fax:  [phone]  Name: Jennifer [name] MRN: [id number] Date of Birth: May 25, 1945

## 2015-06-26 ENCOUNTER — Ambulatory Visit: Payer: Medicare PPO | Admitting: Physical Therapy

## 2015-06-26 DIAGNOSIS — M6281 Muscle weakness (generalized): Secondary | ICD-10-CM

## 2015-06-26 DIAGNOSIS — M25552 Pain in left hip: Secondary | ICD-10-CM

## 2015-06-26 DIAGNOSIS — R262 Difficulty in walking, not elsewhere classified: Secondary | ICD-10-CM

## 2015-06-26 DIAGNOSIS — M545 Low back pain, unspecified: Secondary | ICD-10-CM

## 2015-06-26 NOTE — Therapy (Signed)
[place] [facility] [address] [place], [place], [address] Phone: [phone]   Fax:  [phone]  Physical Therapy Treatment  Patient Details  Name: Jennifer [name] MRN: [id number] Date of Birth: 06/30/1945 Referring Provider: [name]  Encounter Date: 06/26/2015      PT End of Session - 06/27/15 2139    Visit Number 6   Number of Visits 8   Date for PT Re-Evaluation 07/02/15   Authorization - Visit Number 6   Authorization - Number of Visits 10   PT Start Time 1112   PT Stop Time 1202   PT Time Calculation (min) 50 min   Activity Tolerance Patient tolerated treatment well   Behavior During Therapy [facility] for tasks assessed/performed      Past Medical History  Diagnosis Date  . Scoliosis   . Depression   . Hypertension   . Asthma   . COPD (chronic obstructive pulmonary disease) ([place])   . Ulcer   . Bronchitis   . Gastritis   . Arthritis   . Chronic headaches   . GERD (gastroesophageal reflux disease)     Past Surgical History  Procedure Laterality Date  . Lumbar disc surgery  1998    [facility]  . Cholecystectomy  1996  . Partial hysterectomy  1981    prolapsed uterus  . Tubal ligation  1978  . Dilation and curettage of uterus  1971  . Abdominal hysterectomy      There were no vitals filed for this visit.      Subjective Assessment - 06/27/15 2139    Subjective No c/o pain at this time.  Pt. has been busy with grandkids.  Pt. states she is doing better.     Patient Stated Goals Increase LE muscle strength/ decrease hip and back pain.    Currently in Pain? No/denies        OBJECTIVE: Scifit L7 15 min. B UE/LE (cuing for TrA muscle contraction). There.ex.:  TG knee flexion (added shoulder flexion)/ heel raises- 30x. Nautilus: walk outs 30# 5x L/R. Nautilus: scap. Retraction 30#/ lat. Pull downs on ball 40# and tricep ext. on ball 30# (cuing to maintain core muscle contraction t/o UE ex.). Seated ball ex. (3#)  bicep curls/ press-ups.  Posture correction/ verbal and tactile cuing.  Manual tx.: prone STM to low thoracic/lumbar region (pt. Has not had time to call massage therapist).  Pt response for medical necessity: pt. Benefits from core stability ex. Program with focus on posture correction to improve pain-free mobility. No back pain during tx. Session.         PT Long Term Goals - 06/06/15 1813    PT LONG TERM GOAL #1   Title Pt. I with HEP to increase B LE/ core muscle strengthening to 4+/5 MMT to improve posture/ pain-free mobility.     Baseline B LE muscle strength 4/5 MMT.  Difficulty maintaining core muscle activiation.   Time 4   Period Weeks   Status New   PT LONG TERM GOAL #2   Title Pt. will decrease Modified Oswestry Low Back Pain Questionnaire to <20% to improve pain-free mobility.     Baseline MODI: 36% self perceived moderate disability   Time 4   Period Weeks   Status New   PT LONG TERM GOAL #3   Title Pt. will report no L hip pain with L sidelying at night to improve sleeping posture with use of CPAP machine.    Baseline  Unable to sleep on L side due to L hip pain.    Time 4   Period Weeks   Status New   PT LONG TERM GOAL #4   Title Pt. able to participate with 30 minute ther. ex. program to transition to Silver Sneakers to improve overall functional mobility/ upright posture.     Baseline Limited ex. program.    Time 4   Period Weeks   Status New               Plan - 06/27/15 2140    Clinical Impression Statement Cuing to correct seated/ upright posture during tx. session.  Paraspinal musculature remains tight but no pain reported with position changes/ ther.ex.  Pt. will benefit from continued progression of ther.ex. and probable discharge next week if all goals met.    Rehab Potential Fair   PT Frequency 2x / week   PT Duration 4 weeks   PT Treatment/Interventions ADLs/Self Care Home Management;Cryotherapy;Balance training;Therapeutic  exercise;Therapeutic activities;Functional mobility training;Stair training;Gait training;Neuromuscular re-education;Patient/family education;Manual techniques;Energy conservation;Passive range of motion   PT Next Visit Plan Reassess goals/ HEP next tx.   PT Home Exercise Plan Core ex. program/ Clamshells.  Ball ex.   Consulted and Agree with Plan of Care Patient      Patient will benefit from skilled therapeutic intervention in order to improve the following deficits and impairments:  Decreased endurance, Hypomobility, Pain, Decreased strength, Decreased mobility, Difficulty walking, Decreased range of motion, Improper body mechanics  Visit Diagnosis: Left-sided low back pain without sciatica  Muscle weakness (generalized)  Pain in left hip  Difficulty in walking, not elsewhere classified     Problem List Patient Active Problem List   Diagnosis Date Noted  . Left low back pain 05/22/2015  . Left hip pain 05/22/2015  . SOB (shortness of breath) 11/11/2014  . Right shoulder pain 07/16/2014  . Health care maintenance 06/03/2014  . Arm skin lesion, left 06/18/2013  . Conjunctivitis 06/07/2013  . URI (upper respiratory infection) 03/03/2013  . Hyperglycemia 11/12/2012  . Hypercholesterolemia 11/12/2012  . Essential hypertension, benign 05/12/2012  . GERD (gastroesophageal reflux disease) 05/12/2012  . Gastritis 05/12/2012  . History of colonic polyps 05/12/2012  . Asthma 12/13/2011  . Chronic restrictive lung disease 05/11/2011   Pura Spice, PT, DPT # [phone]   06/27/2015, 9:45 PM  [facility] [address] [place], [place], [address] Phone: [phone]   Fax:  [phone]  Name: Jennifer [name] MRN: [id number] Date of Birth: [birth year]

## 2015-07-01 ENCOUNTER — Ambulatory Visit: Payer: Medicare PPO | Admitting: Physical Therapy

## 2015-07-01 ENCOUNTER — Encounter: Payer: Self-pay | Admitting: Physical Therapy

## 2015-07-01 DIAGNOSIS — M545 Low back pain, unspecified: Secondary | ICD-10-CM

## 2015-07-01 DIAGNOSIS — M6281 Muscle weakness (generalized): Secondary | ICD-10-CM

## 2015-07-01 DIAGNOSIS — M25552 Pain in left hip: Secondary | ICD-10-CM

## 2015-07-01 DIAGNOSIS — R262 Difficulty in walking, not elsewhere classified: Secondary | ICD-10-CM

## 2015-07-01 NOTE — Therapy (Signed)
[place] [facility] [address] [place], [place], [address] Phone: [phone]   Fax:  [phone]  Physical Therapy Treatment  Patient Details  Name: Jennifer [name] MRN: [id number] Date of Birth: 20-Jan-1946 Referring Provider: [name]  Encounter Date: 07/01/2015      PT End of Session - 07/01/15 1330    Visit Number 7   Number of Visits 8   Date for PT Re-Evaluation 07/02/15   Authorization - Visit Number 7   Authorization - Number of Visits 10   PT Start Time 1301   PT Stop Time [id number]   PT Time Calculation (min) 54 min   Activity Tolerance Patient tolerated treatment well   Behavior During Therapy [facility] for tasks assessed/performed      Past Medical History  Diagnosis Date  . Scoliosis   . Depression   . Hypertension   . Asthma   . COPD (chronic obstructive pulmonary disease) ([place])   . Ulcer   . Bronchitis   . Gastritis   . Arthritis   . Chronic headaches   . GERD (gastroesophageal reflux disease)     Past Surgical History  Procedure Laterality Date  . Lumbar disc surgery  1998    [facility]  . Cholecystectomy  1996  . Partial hysterectomy  1981    prolapsed uterus  . Tubal ligation  1978  . Dilation and curettage of uterus  1971  . Abdominal hysterectomy      There were no vitals filed for this visit.      Subjective Assessment - 07/01/15 1326    Subjective Pt. has been using exercise ball at home but states the ball feels too small.  Pt. brought ex. ball to PT and pumped ball up to appropriate size for proper sitting posture/ ther.ex at home.  No pain reported at this time.     Limitations Lifting;Standing;Walking;House hold activities   Patient Stated Goals Increase LE muscle strength/ decrease hip and back pain.    Currently in Pain? No/denies      OBJECTIVE: Scifit L7 12 min. B UE/LE (no cuing for posture correction). There.ex.: TG knee flexion (added shoulder flexion)/ heel raises- 30x. Seated ball  ex. (GTB) bicep curls/ diagonals/ B sh. Adduction/ scap. Retraction/ sh. Extension 20x each (mirror feedback). Posture correction/ verbal and tactile cuing.  Manual tx.: prone STM to low thoracic/lumbar region.  Pt response for medical necessity: pt. Benefits from core stability ex. Program with focus on posture correction to improve pain-free mobility. No back or hip pain during tx. Session.       PT Long Term Goals - 06/06/15 1813    PT LONG TERM GOAL #1   Title Pt. I with HEP to increase B LE/ core muscle strengthening to 4+/5 MMT to improve posture/ pain-free mobility.     Baseline B LE muscle strength 4/5 MMT.  Difficulty maintaining core muscle activiation.   Time 4   Period Weeks   Status New   PT LONG TERM GOAL #2   Title Pt. will decrease Modified Oswestry Low Back Pain Questionnaire to <20% to improve pain-free mobility.     Baseline MODI: 36% self perceived moderate disability   Time 4   Period Weeks   Status New   PT LONG TERM GOAL #3   Title Pt. will report no L hip pain with L sidelying at night to improve sleeping posture with use of CPAP machine.    Baseline Unable to sleep on  L side due to L hip pain.    Time 4   Period Weeks   Status New   PT LONG TERM GOAL #4   Title Pt. able to participate with 30 minute ther. ex. program to transition to Silver Sneakers to improve overall functional mobility/ upright posture.     Baseline Limited ex. program.    Time 4   Period Weeks   Status New            Plan - 07/01/15 1331    Clinical Impression Statement Pt. mod. I with proper seated posture on ball (pt. personal ball) once inflated to proper size.  Improve core muscle contraction wtih seated/ standing tasks with no c/o back or hip pain today.  Lumbar muscle remains tight/ hypomobile due to severity of scoliosis.  Pt. remains motivated and compliant with ther.ex. ex. program.     Rehab Potential Fair   PT Frequency 2x / week   PT Duration 4 weeks    PT Treatment/Interventions ADLs/Self Care Home Management;Cryotherapy;Balance training;Therapeutic exercise;Therapeutic activities;Functional mobility training;Stair training;Gait training;Neuromuscular re-education;Patient/family education;Manual techniques;Energy conservation;Passive range of motion   PT Next Visit Plan Recert vs. Discharge next tx. session.    PT Home Exercise Plan Core ex. program/ Clamshells.  Ball ex.   Consulted and Agree with Plan of Care Patient      Patient will benefit from skilled therapeutic intervention in order to improve the following deficits and impairments:  Decreased endurance, Hypomobility, Pain, Decreased strength, Decreased mobility, Difficulty walking, Decreased range of motion, Improper body mechanics  Visit Diagnosis: Left-sided low back pain without sciatica  Muscle weakness (generalized)  Pain in left hip  Difficulty in walking, not elsewhere classified     Problem List Patient Active Problem List   Diagnosis Date Noted  . Left low back pain 05/22/2015  . Left hip pain 05/22/2015  . SOB (shortness of breath) 11/11/2014  . Right shoulder pain 07/16/2014  . Health care maintenance 06/03/2014  . Arm skin lesion, left 06/18/2013  . Conjunctivitis 06/07/2013  . URI (upper respiratory infection) 03/03/2013  . Hyperglycemia 11/12/2012  . Hypercholesterolemia 11/12/2012  . Essential hypertension, benign 05/12/2012  . GERD (gastroesophageal reflux disease) 05/12/2012  . Gastritis 05/12/2012  . History of colonic polyps 05/12/2012  . Asthma 12/13/2011  . Chronic restrictive lung disease 05/11/2011   Pura Spice, PT, DPT # [phone]   07/01/2015, 1:40 PM  [place] [facility] [address] [place], [place], [address] Phone: [phone]   Fax:  [phone]  Name: Jennifer [name] MRN: [id number] Date of Birth: 1945/12/24

## 2015-07-03 ENCOUNTER — Encounter: Payer: Self-pay | Admitting: Physical Therapy

## 2015-07-03 ENCOUNTER — Ambulatory Visit: Payer: Medicare PPO | Admitting: Physical Therapy

## 2015-07-03 ENCOUNTER — Encounter: Payer: Medicare PPO | Admitting: Physical Therapy

## 2015-07-03 ENCOUNTER — Ambulatory Visit: Payer: Medicare PPO

## 2015-07-03 DIAGNOSIS — M545 Low back pain, unspecified: Secondary | ICD-10-CM

## 2015-07-03 DIAGNOSIS — M6281 Muscle weakness (generalized): Secondary | ICD-10-CM

## 2015-07-03 DIAGNOSIS — R262 Difficulty in walking, not elsewhere classified: Secondary | ICD-10-CM

## 2015-07-03 DIAGNOSIS — M25552 Pain in left hip: Secondary | ICD-10-CM

## 2015-07-03 NOTE — Therapy (Signed)
[place] [facility] [address] [place], [place], [address] Phone: [phone]   Fax:  [phone]  Physical Therapy Treatment  Patient Details  Name: Jennifer [name] MRN: [id number] Date of Birth: [birth year] Referring Provider: [name]  Encounter Date: 07/03/2015      PT End of Session - 07/03/15 1348    Visit Number 8   Number of Visits 8   Date for PT Re-Evaluation 07/03/15   Authorization - Visit Number 8   Authorization - Number of Visits 18   PT Start Time 0949   PT Stop Time 1039   PT Time Calculation (min) 50 min   Activity Tolerance Patient tolerated treatment well   Behavior During Therapy [facility] for tasks assessed/performed      Past Medical History  Diagnosis Date  . Scoliosis   . Depression   . Hypertension   . Asthma   . COPD (chronic obstructive pulmonary disease) ([place])   . Ulcer   . Bronchitis   . Gastritis   . Arthritis   . Chronic headaches   . GERD (gastroesophageal reflux disease)     Past Surgical History  Procedure Laterality Date  . Lumbar disc surgery  1998    [facility]  . Cholecystectomy  1996  . Partial hysterectomy  1981    prolapsed uterus  . Tubal ligation  1978  . Dilation and curettage of uterus  1971  . Abdominal hysterectomy      There were no vitals filed for this visit.      Subjective Assessment - 07/03/15 1109    Subjective Pt. states she is doing well.  PT discussed discharge today with progression/ focus on Silver Sneakers ex. program and daily walking.  Pt. reports no hip or back pain at this time.     Limitations Lifting;Standing;Walking;House hold activities   Patient Stated Goals Increase LE muscle strength/ decrease hip and back pain.       OBJECTIVE: Scifit L7.5 12 min. B UE/LE (no cuing for posture correction). There.ex.: TG knee flexion (2# shoulder flexion/ bicep curls/ press-ups)/ heel raises- 30x. Floor to waist box lifting (7#) with cuing for proper technique/  head position (min. To moderate verbal cuing). Posture correction/ verbal and tactile cuing.  Reviewed HEP/ discussed classes and equipment at [employer].    Pt response for medical necessity: pt. Benefits from core stability ex. Program with focus on posture correction to improve pain-free mobility. No back or hip pain during tx. Session. Discharge from PT at this time.        PT Education - 07/03/15 1109    Education provided Yes   Education Details Silver Sneakers ex. program/ aquatic ex.     Person(s) Educated Patient   Methods Explanation;Demonstration;Handout   Comprehension Verbalized understanding;Returned demonstration             PT Long Term Goals - 07/03/15 1353    PT LONG TERM GOAL #1   Title Pt. I with HEP to increase B LE/ core muscle strengthening to 4+/5 MMT to improve posture/ pain-free mobility.     Baseline B LE muscle strength 4+/5 MMT.  Good core muscle contraction in supine posture.     Time 4   Period Weeks   Status Achieved   PT LONG TERM GOAL #2   Title Pt. will decrease Modified Oswestry Low Back Pain Questionnaire to <20% to improve pain-free mobility.     Baseline MODI: 26% self perceived  moderate disability on Jul 26, 2022  (10% point improvement from initial evaluation).     Time 4   Period Weeks   Status Partially Met   PT LONG TERM GOAL #3   Title Pt. will report no L hip pain with L sidelying at night to improve sleeping posture with use of CPAP machine.    Time 4   Period Weeks   Status Partially Met   PT LONG TERM GOAL #4   Title Pt. able to participate with 30 minute ther. ex. program to transition to Silver Sneakers to improve overall functional mobility/ upright posture.     Time 4   Period Weeks   Status Achieved               Plan - 07/26/2015 1349    Clinical Impression Statement Pt. has progressed well towards all PT goals and will benefit from discharge at this time with referral to  [facility] (land based/ aquatic ex. program).  No c/o hip or back pain today during standing/ supine ther.ex. program.     Rehab Potential Fair   PT Frequency 2x / week   PT Duration 4 weeks   PT Treatment/Interventions ADLs/Self Care Home Management;Cryotherapy;Balance training;Therapeutic exercise;Therapeutic activities;Functional mobility training;Stair training;Gait training;Neuromuscular re-education;Patient/family education;Manual techniques;Energy conservation;Passive range of motion   PT Next Visit Plan Discharge   PT Home Exercise Plan Core ex. program/ Clamshells.  Ball ex.   Consulted and Agree with Plan of Care Patient      Patient will benefit from skilled therapeutic intervention in order to improve the following deficits and impairments:  Decreased endurance, Hypomobility, Pain, Decreased strength, Decreased mobility, Difficulty walking, Decreased range of motion, Improper body mechanics  Visit Diagnosis: Left-sided low back pain without sciatica  Muscle weakness (generalized)  Pain in left hip  Difficulty in walking, not elsewhere classified       G-Codes - 07-26-2015 1355    Functional Assessment Tool Used MODI/ clinical impression/ pain/ muscle weakness/ joint stiffness   Functional Limitation Mobility: Walking and moving around   Mobility: Walking and Moving Around Current Status ([id number]) At least 20 percent but less than 40 percent impaired, limited or restricted   Mobility: Walking and Moving Around Goal Status [phone]) At least 20 percent but less than 40 percent impaired, limited or restricted   Mobility: Walking and Moving Around Discharge Status [phone]) At least 20 percent but less than 40 percent impaired, limited or restricted      Problem List Patient Active Problem List   Diagnosis Date Noted  . Left low back pain 05/22/2015  . Left hip pain 05/22/2015  . SOB (shortness of breath) 11/11/2014  . Right shoulder pain 07/16/2014  . Health care  maintenance 06/03/2014  . Arm skin lesion, left 06/18/2013  . Conjunctivitis 06/07/2013  . URI (upper respiratory infection) 03/03/2013  . Hyperglycemia 11/12/2012  . Hypercholesterolemia 11/12/2012  . Essential hypertension, benign 05/12/2012  . GERD (gastroesophageal reflux disease) 05/12/2012  . Gastritis 05/12/2012  . History of colonic polyps 05/12/2012  . Asthma 12/13/2011  . Chronic restrictive lung disease 05/11/2011   Pura Spice, PT, DPT # [phone]   07/26/15, 1:56 PM  [place] [facility] [address] [place], [place], [address] Phone: [phone]   Fax:  [phone]  Name: BELLAMIE TURNEY MRN: [id number] Date of Birth: 1945-06-03

## 2015-07-20 ENCOUNTER — Other Ambulatory Visit: Payer: Self-pay | Admitting: Internal Medicine

## 2015-07-26 ENCOUNTER — Other Ambulatory Visit: Payer: Self-pay | Admitting: Internal Medicine

## 2015-07-26 MED ORDER — PRAVASTATIN SODIUM 10 MG PO TABS: ORAL_TABLET | ORAL | Status: DC

## 2015-07-30 ENCOUNTER — Ambulatory Visit
Admission: RE | Admit: 2015-07-30 | Discharge: 2015-07-30 | Disposition: A | Discharge status: Home / Self Care | Payer: Medicare PPO | Source: Ambulatory Visit | Attending: Internal Medicine | Admitting: Internal Medicine

## 2015-07-30 DIAGNOSIS — Z1231 Encounter for screening mammogram for malignant neoplasm of breast: Secondary | ICD-10-CM | POA: Diagnosis not present

## 2015-07-30 DIAGNOSIS — Z1239 Encounter for other screening for malignant neoplasm of breast: Secondary | ICD-10-CM

## 2015-08-10 IMAGING — CR DG FOOT COMPLETE 3+V*R*
1 series · 3 of 3 positions shown · non-contrast
Comparison: None.

CLINICAL DATA: Fell last night with right foot pain

EXAM:
RIGHT FOOT COMPLETE - 3+ VIEW

[Series 1: ap · 0.17mm/px · 3 of 3 slices shown]
[im 1/3]
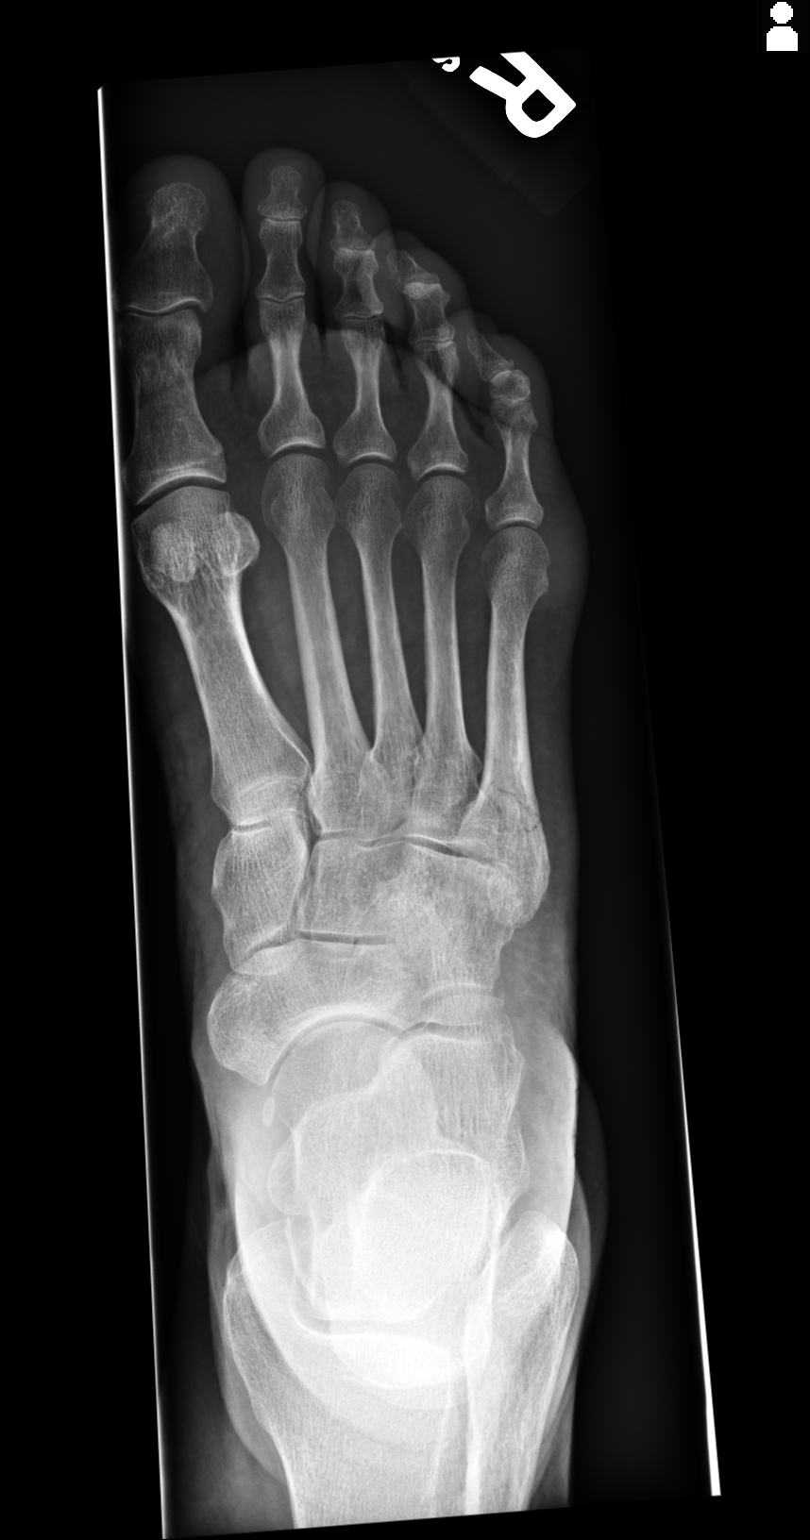
[im 2/3]
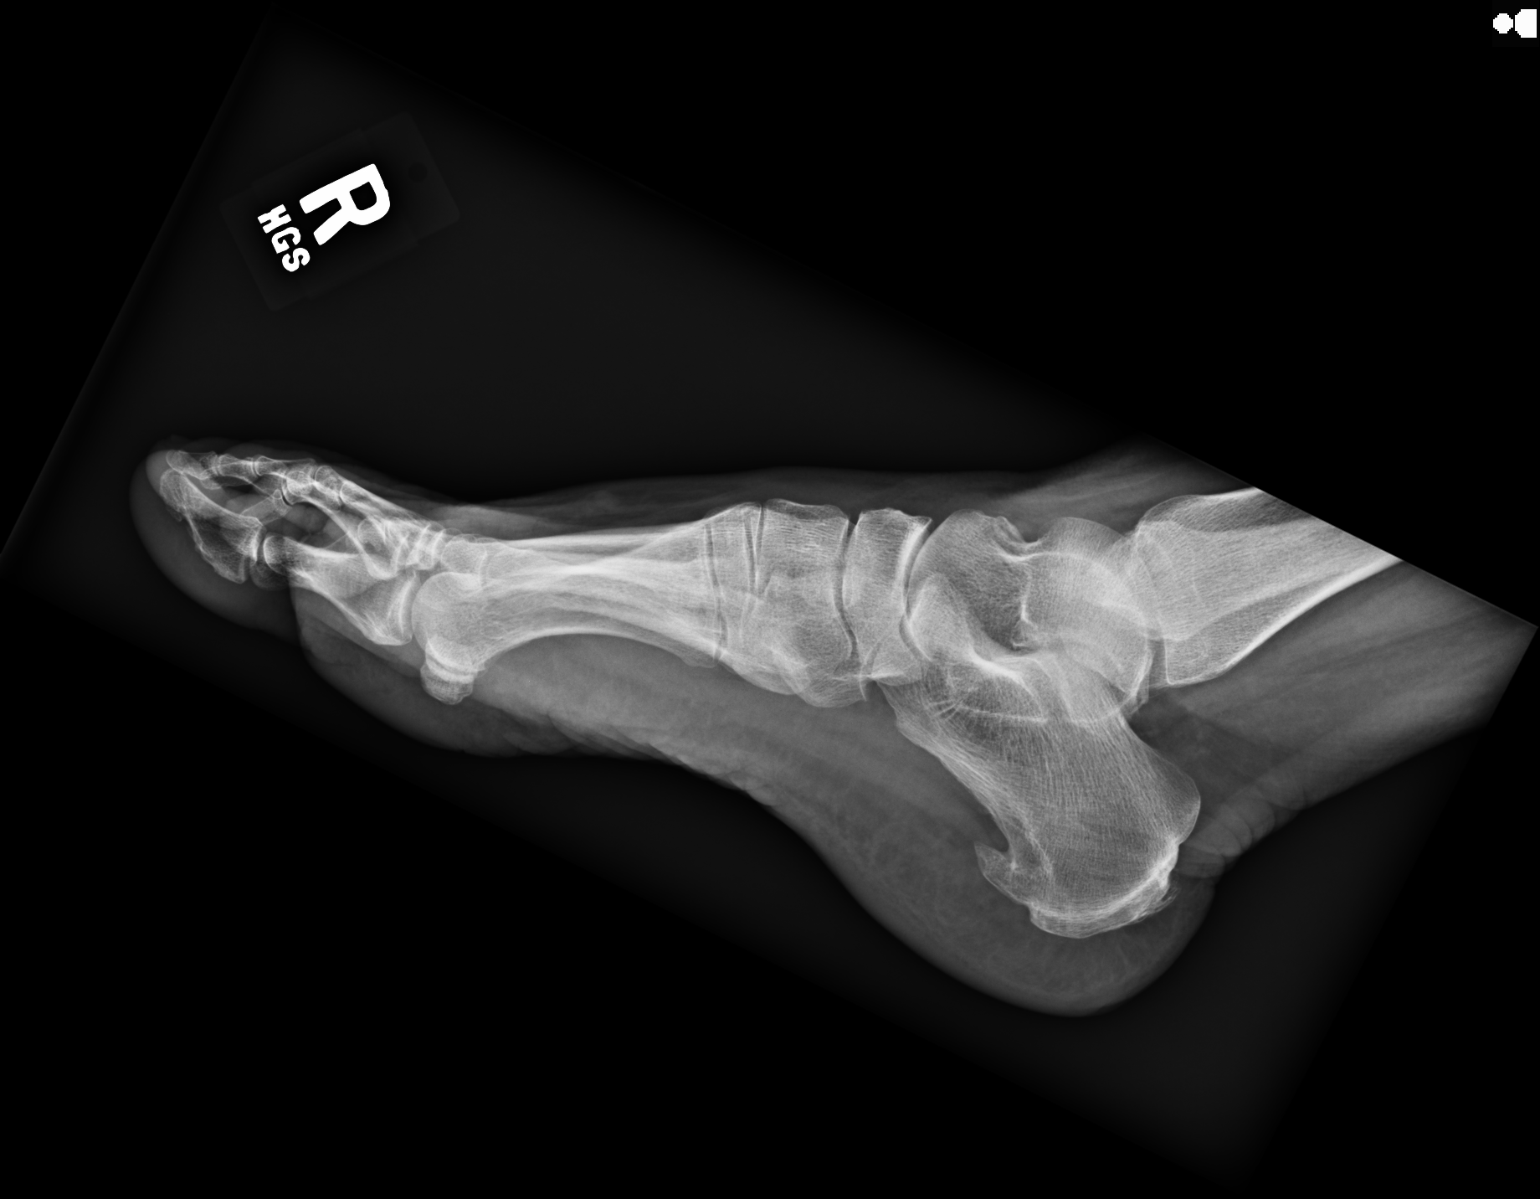
[im 3/3]
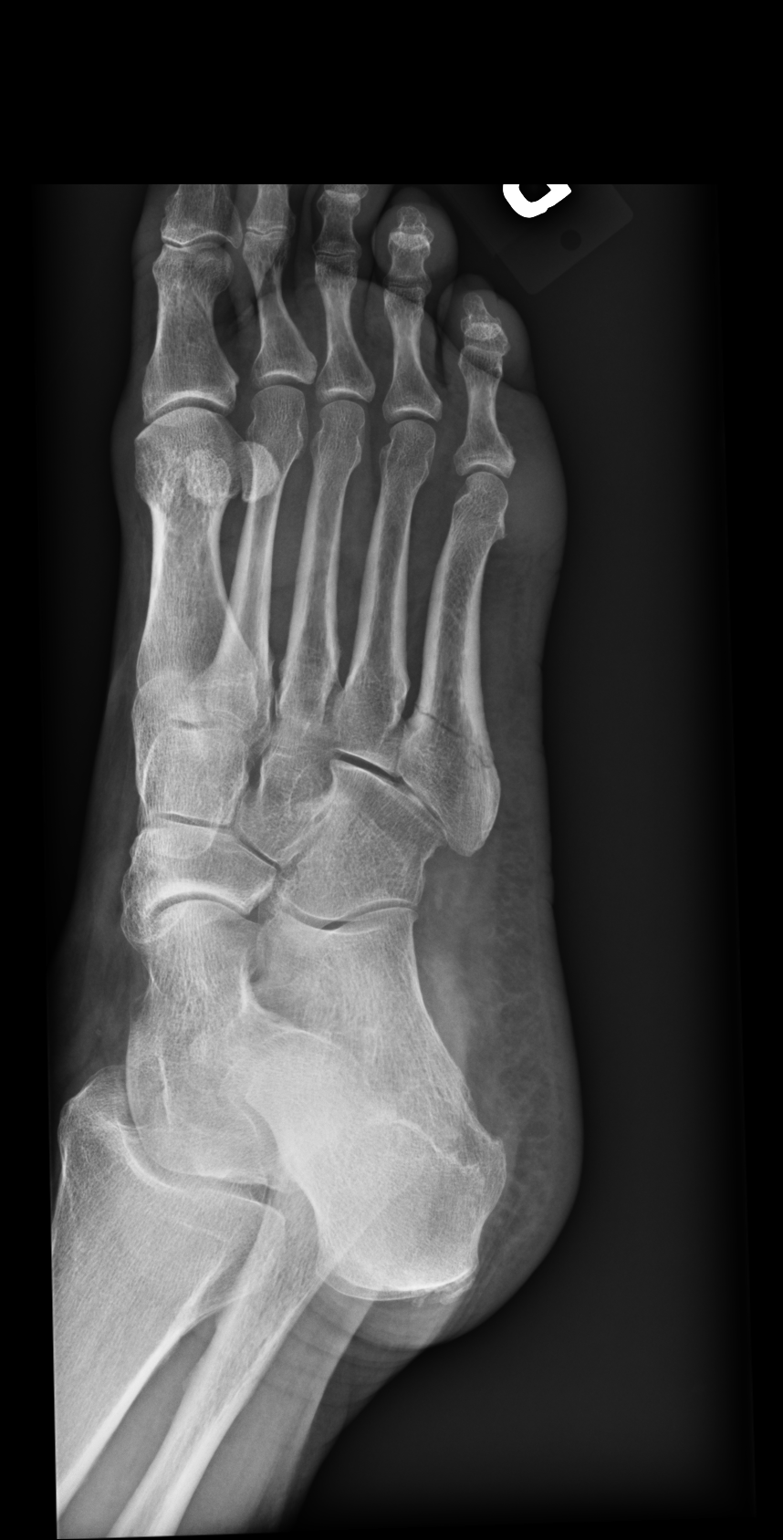

[3 of 3 positions shown; findings below may reference images not displayed]

FINDINGS: Nondisplaced fracture involving the proximal fifth metatarsal shaft.
Large heel spur.
IMPRESSION: Fracture fifth metatarsal

## 2015-08-14 ENCOUNTER — Other Ambulatory Visit: Payer: Self-pay | Admitting: Internal Medicine

## 2015-08-26 ENCOUNTER — Encounter: Payer: Medicare PPO | Admitting: Internal Medicine

## 2015-08-26 ENCOUNTER — Telehealth: Payer: Self-pay | Admitting: Internal Medicine

## 2015-08-26 NOTE — Telephone Encounter (Signed)
FYI, Pt called stating she can not come in for her appt today, due to husband taken the keys and he's working all the way in [place]. Pt states she will call back to resch appt. Let me know if you want appt to be cancelled. Thank you!

## 2015-08-26 NOTE — Telephone Encounter (Signed)
appt already off schedule.

## 2015-09-02 ENCOUNTER — Encounter: Payer: Medicare PPO | Admitting: Internal Medicine

## 2015-09-12 ENCOUNTER — Other Ambulatory Visit: Payer: Self-pay | Admitting: Internal Medicine

## 2015-09-12 NOTE — Telephone Encounter (Signed)
Next ov: 11/11/15

## 2015-09-12 NOTE — Telephone Encounter (Signed)
Last fill 07/06/15. Last seen 05/22/15.

## 2015-09-13 ENCOUNTER — Other Ambulatory Visit: Payer: Self-pay

## 2015-09-13 ENCOUNTER — Telehealth: Payer: Self-pay | Admitting: *Deleted

## 2015-09-13 MED ORDER — PANTOPRAZOLE SODIUM 40 MG PO TBEC: DELAYED_RELEASE_TABLET | ORAL | 1 refills | Status: DC

## 2015-09-13 NOTE — Telephone Encounter (Signed)
Refill has already been placed.

## 2015-09-13 NOTE — Telephone Encounter (Signed)
Patient has requested to have her clonazepam  Jennifer [name]

## 2015-09-13 NOTE — Telephone Encounter (Signed)
Pt called again saying she called her pharmacy and was told they didn't have it. Per Ms. Sarkissian, the Pharmacist told her the Rx can be called in since they don't have the Rx. She's wondering how this will be handled and wants to know if it can be called in. Once it's been taken care of, she'd like a phone call. When I looked in her chart I saw, "Print" but didn't see a Rx up front.   Pt's ph# [phone] Thank you.

## 2015-09-13 NOTE — Telephone Encounter (Signed)
Rx faxed to [facility] 

## 2015-09-13 NOTE — Telephone Encounter (Signed)
Patient husband has been informed.

## 2015-09-22 ENCOUNTER — Other Ambulatory Visit: Payer: Self-pay | Admitting: Internal Medicine

## 2015-10-18 ENCOUNTER — Other Ambulatory Visit: Payer: Self-pay | Admitting: Internal Medicine

## 2015-10-28 ENCOUNTER — Encounter: Payer: Self-pay | Admitting: Internal Medicine

## 2015-10-28 ENCOUNTER — Ambulatory Visit (INDEPENDENT_AMBULATORY_CARE_PROVIDER_SITE_OTHER): Payer: Medicare PPO | Admitting: Internal Medicine

## 2015-10-28 DIAGNOSIS — J069 Acute upper respiratory infection, unspecified: Secondary | ICD-10-CM | POA: Diagnosis not present

## 2015-10-28 MED ORDER — PREDNISONE 10 MG PO TABS: ORAL_TABLET | ORAL | 0 refills | Status: DC

## 2015-10-28 MED ORDER — CEFDINIR 300 MG PO CAPS: 300.0000 mg | ORAL_CAPSULE | Freq: Two times a day (BID) | ORAL | 0 refills | Status: DC

## 2015-10-28 MED ORDER — IPRATROPIUM BROMIDE 0.02 % IN SOLN: 0.5000 mg | Freq: Four times a day (QID) | RESPIRATORY_TRACT | 1 refills | Status: DC

## 2015-10-28 NOTE — Progress Notes (Signed)
[name], female   DOB: 06-16-1945, 70 y.o.   MRN: [id number]   Subjective:    [name], female    DOB: 12-26-1945, 70 y.o.   MRN: [id number]  HPI  Patient here as a work in with concerns regarding increased cough for four days.  She reports that she initially noticed minimal cough and just did not feel well.  The cough increased yesterday.  Feels a little more sob at times.  Increased nasal congestion.  Increased cough and wheezing now.  Cough occasionally productive.  Question of subjective fever.  She is eating.  No diarrhea. Symptoms progressing fast.     Past Medical History:  Diagnosis Date  . Arthritis   . Asthma   . Bronchitis   . Chronic headaches   . COPD (chronic obstructive pulmonary disease) ([place])   . Depression   . Gastritis   . GERD (gastroesophageal reflux disease)   . Hypertension   . Scoliosis   . Ulcer [facility])    Past Surgical History:  Procedure Laterality Date  . ABDOMINAL HYSTERECTOMY    . CHOLECYSTECTOMY  1996  . DILATION AND CURETTAGE OF UTERUS  1971  . [place]  . PARTIAL HYSTERECTOMY  1981   prolapsed uterus  . TUBAL LIGATION  1978   Family History  Problem Relation Age of Onset  . Stroke Mother   . CVA Maternal Grandmother   . Breast cancer Neg Hx   . Colon cancer Neg Hx    Social History   Social History  . Marital status: Married    Spouse name: N/A  . Number of children: 2  . Years of education: N/A   Occupational History  . retired Pharmacist, hospital    Social History Main Topics  . Smoking status: Never Smoker  . Smokeless tobacco: Never Used  . Alcohol use No  . Drug use: No  . Sexual activity: Not Asked   Other Topics Concern  . None   Social History Narrative  . None    Outpatient Encounter Prescriptions as of 10/28/2015  Medication Sig  . citalopram (CELEXA) 40 MG tablet Take 1 tablet (40 mg total) by mouth daily.  . clonazePAM (KLONOPIN) 0.5 MG tablet TAKE ONE HALF TO ONE  TABLET DAILY AS NEEDED.  . fluticasone (FLONASE) 50 MCG/ACT nasal spray Place 2 sprays into the nose daily as needed.  . hydrochlorothiazide (HYDRODIURIL) 25 MG tablet Take 1 tablet (25 mg total) by mouth daily.  Marland Kitchen ipratropium (ATROVENT) 0.02 % nebulizer solution Take 2.5 mLs (0.5 mg total) by nebulization 4 (four) times daily.  Marland Kitchen levalbuterol (XOPENEX HFA) 45 MCG/ACT inhaler Inhale 1-2 puffs into the lungs every 6 (six) hours as needed.  . pantoprazole (PROTONIX) 40 MG tablet Take 1 tablet (40 mg total) by mouth daily.  . pravastatin (PRAVACHOL) 10 MG tablet Take 1 tablet (10 mg total) by mouth daily.  . Probiotic Product (PROBIOTIC DAILY PO) Take by mouth.  . ranitidine (ZANTAC) 150 MG tablet Take 150 mg by mouth at bedtime.  Marland Kitchen SPIRIVA HANDIHALER 18 MCG inhalation capsule Place 1 capsule (18 mcg total) into inhaler and inhale daily.  . [DISCONTINUED] ipratropium (ATROVENT) 0.02 % nebulizer solution Take 2.5 mLs (0.5 mg total) by nebulization 4 (four) times daily.  . cefdinir (OMNICEF) 300 MG capsule Take 1 capsule (300 mg total) by mouth 2 (two) times daily.  . predniSONE (DELTASONE) 10 MG tablet Take 6 tablets  x 1 day and then decrease by 1/2 tablet per day until down to zero mg.   No facility-administered encounter medications on file as of 10/28/2015.     Review of Systems  Constitutional: Negative for appetite change and unexpected weight change.  HENT: Positive for congestion and postnasal drip.   Respiratory: Positive for cough and shortness of breath.   Cardiovascular: Negative for chest pain, palpitations and leg swelling.  Gastrointestinal: Negative for diarrhea, nausea and vomiting.  Skin: Negative for color change and rash.  Neurological: Negative for dizziness and headaches.       Objective:    Physical Exam  Constitutional: She appears well-developed and well-nourished. No distress.  HENT:  Mouth/Throat: Oropharynx is clear and moist.  Nares - slightly erythematous  turbinates.   Neck: Neck supple.  Cardiovascular: Normal rate and regular rhythm.   Pulmonary/Chest: Breath sounds normal.  Increased cough with expiration and forced expiration.  Some wheezing.    Lymphadenopathy:    She has no cervical adenopathy.  Skin: No rash noted. No erythema.    BP 110/70   Pulse 79   Temp 98.4 F (36.9 C) (Oral)   Wt 168 lb 6.4 oz (76.4 kg)   SpO2 95%   BMI 31.82 kg/m  Wt Readings from Last 3 Encounters:  10/28/15 168 lb 6.4 oz (76.4 kg)  05/22/15 166 lb (75.3 kg)  02/26/15 166 lb (75.3 kg)     Lab Results  Component Value Date   WBC 7.9 06/14/2015   HGB 14.1 06/14/2015   HCT 42.2 06/14/2015   PLT 230.0 06/14/2015   GLUCOSE 107 (H) 06/14/2015   CHOL 197 06/14/2015   TRIG 123.0 06/14/2015   HDL 54.10 06/14/2015   LDLDIRECT 148.4 11/03/2012   LDLCALC 118 (H) 06/14/2015   ALT 15 06/14/2015   AST 19 06/14/2015   NA 140 06/14/2015   K 3.8 06/14/2015   CL 102 06/14/2015   CREATININE 0.67 06/14/2015   BUN 16 06/14/2015   CO2 32 06/14/2015   TSH 1.04 06/14/2015   HGBA1C 5.8 06/14/2015    Mm Digital Screening Bilateral  Result Date: 07/30/2015 CLINICAL DATA:  Screening. EXAM: DIGITAL SCREENING BILATERAL MAMMOGRAM WITH CAD COMPARISON:  Previous exam(s). ACR Breast Density Category c: The breast tissue is heterogeneously dense, which may obscure small masses. FINDINGS: There are no findings suspicious for malignancy. Images were processed with CAD. IMPRESSION: No mammographic evidence of malignancy. A result letter of this screening mammogram will be mailed directly to the patient. RECOMMENDATION: Screening mammogram in one year. (Code:SM-B-01Y) BI-RADS CATEGORY  1: Negative. Electronically Signed   By: Dorise Bullion III M.D   On: 07/30/2015 15:45       Assessment & Plan:   Problem List Items Addressed This Visit    URI (upper respiratory infection)    Increased cough, congestion and wheezing as outlined.  She wanted to hold on neb here in  the office.  States will take at home,  Treat with prednisone taper as directed.  Omnicef.  Continue to take a probiotic daily.  Saline nasal spray and nasacort nasal spray as directed.  Rest.  Fluids.  Follow.  Notify me or be evaluated if symptoms change or worsen.        Relevant Medications   cefdinir (OMNICEF) 300 MG capsule    Other Visit Diagnoses   None.      [name], MD

## 2015-10-28 NOTE — Patient Instructions (Signed)
Saline nasal spray - flush nose at least 2-3 x/day  nasacort nasal spray - 2 sprays each nostril one time per day.  Do this in the evening.    While on the antibiotic, take a probiotic daily.  Continue to take for two more weeks after completing the antibiotic.

## 2015-10-28 NOTE — Progress Notes (Signed)
Pre visit review using our clinic review tool, if applicable. No additional management support is needed unless otherwise documented below in the visit note. 

## 2015-10-29 ENCOUNTER — Encounter: Payer: Self-pay | Admitting: Internal Medicine

## 2015-10-29 NOTE — Assessment & Plan Note (Signed)
Increased cough, congestion and wheezing as outlined.  She wanted to hold on neb here in the office.  States will take at home,  Treat with prednisone taper as directed.  Omnicef.  Continue to take a probiotic daily.  Saline nasal spray and nasacort nasal spray as directed.  Rest.  Fluids.  Follow.  Notify me or be evaluated if symptoms change or worsen.

## 2015-11-11 ENCOUNTER — Encounter: Payer: Self-pay | Admitting: Internal Medicine

## 2015-11-11 ENCOUNTER — Ambulatory Visit (INDEPENDENT_AMBULATORY_CARE_PROVIDER_SITE_OTHER): Payer: Medicare PPO | Admitting: Internal Medicine

## 2015-11-11 VITALS — BP 120/78 | HR 76 | Temp 98.2°F | Ht 61.0 in | Wt 169.0 lb

## 2015-11-11 DIAGNOSIS — K2100 Gastro-esophageal reflux disease with esophagitis, without bleeding: Secondary | ICD-10-CM

## 2015-11-11 DIAGNOSIS — Z0000 Encounter for general adult medical examination without abnormal findings: Secondary | ICD-10-CM

## 2015-11-11 DIAGNOSIS — J984 Other disorders of lung: Secondary | ICD-10-CM

## 2015-11-11 DIAGNOSIS — I10 Essential (primary) hypertension: Secondary | ICD-10-CM

## 2015-11-11 DIAGNOSIS — J069 Acute upper respiratory infection, unspecified: Secondary | ICD-10-CM

## 2015-11-11 DIAGNOSIS — E7800 Pure hypercholesterolemia, unspecified: Secondary | ICD-10-CM

## 2015-11-11 DIAGNOSIS — J4520 Mild intermittent asthma, uncomplicated: Secondary | ICD-10-CM

## 2015-11-11 DIAGNOSIS — Z860100 Personal history of colon polyps, unspecified: Secondary | ICD-10-CM

## 2015-11-11 DIAGNOSIS — R739 Hyperglycemia, unspecified: Secondary | ICD-10-CM

## 2015-11-11 MED ORDER — CEFDINIR 300 MG PO CAPS: 300.0000 mg | ORAL_CAPSULE | Freq: Two times a day (BID) | ORAL | 0 refills | Status: DC

## 2015-11-11 MED ORDER — PREDNISONE 10 MG PO TABS: ORAL_TABLET | ORAL | 0 refills | Status: DC

## 2015-11-11 NOTE — Progress Notes (Signed)
[name], female   DOB: 16-Feb-1945, 70 y.o.   MRN: [id number]   Subjective:    [name], female    DOB: 15-Dec-1945, 70 y.o.   MRN: [id number]  HPI  Patient here for her physical exam.  She was seen recently with increased cough, congestion and wheezing.  Treated with abx and prednisone.  Doing better now.  Breathing better.  Has been having to eat more soft foods and carbs recently secondary to her teeth.  Planning to get new teeth Thursday.  After this plans to get more in a routine with her diet and exercise.  No nausea or vomiting.  Bowels stable.    Past Medical History:  Diagnosis Date  . Arthritis   . Asthma   . Bronchitis   . Chronic headaches   . COPD (chronic obstructive pulmonary disease) ([place])   . Depression   . Gastritis   . GERD (gastroesophageal reflux disease)   . Hypertension   . Scoliosis   . Ulcer [facility])    Past Surgical History:  Procedure Laterality Date  . ABDOMINAL HYSTERECTOMY    . CHOLECYSTECTOMY  1996  . DILATION AND CURETTAGE OF UTERUS  1971  . [place]  . PARTIAL HYSTERECTOMY  1981   prolapsed uterus  . TUBAL LIGATION  1978   Family History  Problem Relation Age of Onset  . Stroke Mother   . CVA Maternal Grandmother   . Breast cancer Neg Hx   . Colon cancer Neg Hx    Social History   Social History  . Marital status: Married    Spouse name: N/A  . Number of children: 2  . Years of education: N/A   Occupational History  . retired Pharmacist, hospital    Social History Main Topics  . Smoking status: Never Smoker  . Smokeless tobacco: Never Used  . Alcohol use No  . Drug use: No  . Sexual activity: Not Asked   Other Topics Concern  . None   Social History Narrative  . None    Outpatient Encounter Prescriptions as of 11/11/2015  Medication Sig  . cefdinir (OMNICEF) 300 MG capsule Take 1 capsule (300 mg total) by mouth 2 (two) times daily.  . citalopram (CELEXA) 40 MG tablet Take 1  tablet (40 mg total) by mouth daily.  . fluticasone (FLONASE) 50 MCG/ACT nasal spray Place 2 sprays into the nose daily as needed.  . hydrochlorothiazide (HYDRODIURIL) 25 MG tablet Take 1 tablet (25 mg total) by mouth daily.  Marland Kitchen ipratropium (ATROVENT) 0.02 % nebulizer solution Take 2.5 mLs (0.5 mg total) by nebulization 4 (four) times daily.  Marland Kitchen levalbuterol (XOPENEX HFA) 45 MCG/ACT inhaler Inhale 1-2 puffs into the lungs every 6 (six) hours as needed.  . pantoprazole (PROTONIX) 40 MG tablet Take 1 tablet (40 mg total) by mouth daily.  . pravastatin (PRAVACHOL) 10 MG tablet Take 1 tablet (10 mg total) by mouth daily.  . predniSONE (DELTASONE) 10 MG tablet Take 6 tablets x 1 day and then decrease by 1/2 tablet per day until down to zero mg.  . Probiotic Product (PROBIOTIC DAILY PO) Take by mouth.  . ranitidine (ZANTAC) 150 MG tablet Take 150 mg by mouth at bedtime.  Marland Kitchen SPIRIVA HANDIHALER 18 MCG inhalation capsule Place 1 capsule (18 mcg total) into inhaler and inhale daily.  . [DISCONTINUED] cefdinir (OMNICEF) 300 MG capsule Take 1 capsule (300 mg total) by mouth 2 (  two) times daily.  . [DISCONTINUED] cefdinir (OMNICEF) 300 MG capsule Take 1 capsule (300 mg total) by mouth 2 (two) times daily.  . [DISCONTINUED] clonazePAM (KLONOPIN) 0.5 MG tablet TAKE ONE HALF TO ONE TABLET DAILY AS NEEDED.  . [DISCONTINUED] predniSONE (DELTASONE) 10 MG tablet Take 6 tablets x 1 day and then decrease by 1/2 tablet per day until down to zero mg.  . [DISCONTINUED] predniSONE (DELTASONE) 10 MG tablet Take 6 tablets x 1 day and then decrease by 1/2 tablet per day until down to zero mg.   No facility-administered encounter medications on file as of 11/11/2015.     Review of Systems  Constitutional: Negative for unexpected weight change.       Has had to eat more soft foods recently.   HENT: Negative for congestion and sinus pressure.   Eyes: Negative for pain and visual disturbance.  Respiratory: Negative for chest  tightness and shortness of breath.        Cough much improved.    Cardiovascular: Negative for chest pain, palpitations and leg swelling.  Gastrointestinal: Negative for abdominal pain, diarrhea, nausea and vomiting.  Genitourinary: Negative for difficulty urinating and dysuria.  Musculoskeletal: Negative for back pain and joint swelling.  Skin: Negative for color change and rash.  Neurological: Negative for dizziness, light-headedness and headaches.  Hematological: Negative for adenopathy. Does not bruise/bleed easily.  Psychiatric/Behavioral: Negative for agitation and dysphoric mood.       Objective:     Blood pressure rechecked by me:  114/78  Physical Exam  Constitutional: She is oriented to person, place, and time. She appears well-developed and well-nourished. No distress.  HENT:  Nose: Nose normal.  Mouth/Throat: Oropharynx is clear and moist.  Eyes: Right eye exhibits no discharge. Left eye exhibits no discharge. No scleral icterus.  Neck: Neck supple. No thyromegaly present.  Cardiovascular: Normal rate and regular rhythm.   Pulmonary/Chest: Breath sounds normal. No accessory muscle usage. No tachypnea. No respiratory distress. She has no decreased breath sounds. She has no wheezes. She has no rhonchi. Right breast exhibits no inverted nipple, no mass, no nipple discharge and no tenderness (no axillary adenopathy). Left breast exhibits no inverted nipple, no mass, no nipple discharge and no tenderness (no axilarry adenopathy).  Abdominal: Soft. Bowel sounds are normal. There is no tenderness.  Musculoskeletal: She exhibits no edema or tenderness.  Lymphadenopathy:    She has no cervical adenopathy.  Neurological: She is alert and oriented to person, place, and time.  Skin: Skin is warm. No rash noted. No erythema.  Psychiatric: She has a normal mood and affect. Her behavior is normal.    BP 120/78   Pulse 76   Temp 98.2 F (36.8 C) (Oral)   Ht 5' 1"  (1.549 m)   Wt  169 lb (76.7 kg)   SpO2 94%   BMI 31.93 kg/m  Wt Readings from Last 3 Encounters:  11/11/15 169 lb (76.7 kg)  10/28/15 168 lb 6.4 oz (76.4 kg)  05/22/15 166 lb (75.3 kg)     Lab Results  Component Value Date   WBC 7.9 06/14/2015   HGB 14.1 06/14/2015   HCT 42.2 06/14/2015   PLT 230.0 06/14/2015   GLUCOSE 107 (H) 06/14/2015   CHOL 197 06/14/2015   TRIG 123.0 06/14/2015   HDL 54.10 06/14/2015   LDLDIRECT 148.4 11/03/2012   LDLCALC 118 (H) 06/14/2015   ALT 15 06/14/2015   AST 19 06/14/2015   NA 140 06/14/2015   K 3.8  06/14/2015   CL 102 06/14/2015   CREATININE 0.67 06/14/2015   BUN 16 06/14/2015   CO2 32 06/14/2015   TSH 1.04 06/14/2015   HGBA1C 5.8 06/14/2015    Mm Digital Screening Bilateral  Result Date: 07/30/2015 CLINICAL DATA:  Screening. EXAM: DIGITAL SCREENING BILATERAL MAMMOGRAM WITH CAD COMPARISON:  Previous exam(s). ACR Breast Density Category c: The breast tissue is heterogeneously dense, which may obscure small masses. FINDINGS: There are no findings suspicious for malignancy. Images were processed with CAD. IMPRESSION: No mammographic evidence of malignancy. A result letter of this screening mammogram will be mailed directly to the patient. RECOMMENDATION: Screening mammogram in one year. (Code:SM-B-01Y) BI-RADS CATEGORY  1: Negative. Electronically Signed   By: Dorise Bullion III M.D   On: 07/30/2015 15:45       Assessment & Plan:   Problem List Items Addressed This Visit    Asthma    Breathing improved.  Treated recent flare/infection.  Follow.        Relevant Medications   predniSONE (DELTASONE) 10 MG tablet   Chronic restrictive lung disease    Recent infection.  Treated.  Breathing better.  Continue current regimen.        Essential hypertension, benign    Blood pressure under good control.  Continue same medication regimen.  Follow pressures.  Follow metabolic panel.        Relevant Orders   Basic metabolic panel   GERD (gastroesophageal  reflux disease)    EGD as outlined.  Planning for f/u EGD in two weeks.        Health care maintenance    Physical today 11/11/15.  Colonoscopy 02/09/10.  Mammogram 07/30/15 - birads I.  She is planning for EGD and colonoscopy in 2 weeks.  To be performed at [facility].       History of colonic polyps    Colonoscopy 02/09/10.  Recommended f/u colonoscopy 01/2015.  Colonoscopy scheduled to be done in two weeks.        Hypercholesterolemia    Low cholesterol diet and exercise.  Follow lipid panel and liver function tests.  On pravastatin.        Relevant Orders   Hepatic function panel   Lipid panel   Hyperglycemia    Low carb diet and exercise.  Follow met b and a1c.       Relevant Orders   Hemoglobin A1c   URI (upper respiratory infection)    Symptoms improved.  Breathing better.  Follow.        Relevant Medications   cefdinir (OMNICEF) 300 MG capsule    Other Visit Diagnoses    Routine general medical examination at a health care facility    -  Primary       [name], MD

## 2015-11-11 NOTE — Progress Notes (Signed)
Pre visit review using our clinic review tool, if applicable. No additional management support is needed unless otherwise documented below in the visit note. 

## 2015-11-11 NOTE — Assessment & Plan Note (Addendum)
Physical today 11/11/15.  Colonoscopy 02/09/10.  Mammogram 07/30/15 - birads I.  She is planning for EGD and colonoscopy in 2 weeks.  To be performed at [facility].

## 2015-11-12 ENCOUNTER — Other Ambulatory Visit: Payer: Self-pay | Admitting: Internal Medicine

## 2015-11-12 NOTE — Telephone Encounter (Signed)
Last filled 09/13/15. Last seen 10/25/15 for URI.

## 2015-11-17 ENCOUNTER — Encounter: Payer: Self-pay | Admitting: Internal Medicine

## 2015-11-17 NOTE — Assessment & Plan Note (Signed)
Low carb diet and exercise.  Follow met b and a1c.  

## 2015-11-17 NOTE — Assessment & Plan Note (Signed)
EGD as outlined.  Planning for f/u EGD in two weeks.

## 2015-11-17 NOTE — Assessment & Plan Note (Signed)
Breathing improved.  Treated recent flare/infection.  Follow.

## 2015-11-17 NOTE — Assessment & Plan Note (Signed)
Recent infection.  Treated.  Breathing better.  Continue current regimen.

## 2015-11-17 NOTE — Assessment & Plan Note (Signed)
Low cholesterol diet and exercise.  Follow lipid panel and liver function tests.  On pravastatin.   

## 2015-11-17 NOTE — Assessment & Plan Note (Signed)
Symptoms improved.  Breathing better.  Follow.

## 2015-11-17 NOTE — Assessment & Plan Note (Signed)
Blood pressure under good control.  Continue same medication regimen.  Follow pressures.  Follow metabolic panel.   

## 2015-11-17 NOTE — Assessment & Plan Note (Addendum)
Colonoscopy 02/09/10.  Recommended f/u colonoscopy 01/2015.  Colonoscopy scheduled to be done in two weeks.

## 2015-12-16 ENCOUNTER — Other Ambulatory Visit: Payer: Self-pay | Admitting: Internal Medicine

## 2015-12-19 ENCOUNTER — Other Ambulatory Visit: Payer: Self-pay | Admitting: Internal Medicine

## 2016-01-06 ENCOUNTER — Other Ambulatory Visit: Payer: Self-pay | Admitting: Internal Medicine

## 2016-01-08 ENCOUNTER — Other Ambulatory Visit: Payer: Self-pay | Admitting: Internal Medicine

## 2016-01-09 ENCOUNTER — Telehealth: Payer: Self-pay | Admitting: Internal Medicine

## 2016-01-09 NOTE — Telephone Encounter (Signed)
Notified patient that RX has already been sent to pharmacy

## 2016-01-09 NOTE — Telephone Encounter (Signed)
Pt states that [facility] has sent multiple refill requests to Dr. Nicki Reaper for a refill of her clonazepam. She will be out on December 25. She states that she needs this refilled. Please cb pt [phone]

## 2016-01-10 ENCOUNTER — Other Ambulatory Visit (INDEPENDENT_AMBULATORY_CARE_PROVIDER_SITE_OTHER): Payer: Medicare PPO

## 2016-01-10 DIAGNOSIS — E7800 Pure hypercholesterolemia, unspecified: Secondary | ICD-10-CM | POA: Diagnosis not present

## 2016-01-10 DIAGNOSIS — R739 Hyperglycemia, unspecified: Secondary | ICD-10-CM | POA: Diagnosis not present

## 2016-01-10 DIAGNOSIS — I10 Essential (primary) hypertension: Secondary | ICD-10-CM

## 2016-01-10 LAB — LIPID PANEL
CHOLESTEROL: 190 mg/dL (ref 0–200)
HDL: 62.5 mg/dL (ref >39.00)
LDL CALC: 107 mg/dL — AB (ref 0–99)
NonHDL: 127.33
Total CHOL/HDL Ratio: 3
Triglycerides: 101 mg/dL (ref 0.0–149.0)
VLDL: 20.2 mg/dL (ref 0.0–40.0)

## 2016-01-10 LAB — HEPATIC FUNCTION PANEL
ALBUMIN: 4.4 g/dL (ref 3.5–5.2)
ALT: 12 U/L (ref 0–35)
AST: 18 U/L (ref 0–37)
Alkaline Phosphatase: 52 U/L (ref 39–117)
Bilirubin, Direct: 0.1 mg/dL (ref 0.0–0.3)
Total Bilirubin: 0.5 mg/dL (ref 0.2–1.2)
Total Protein: 6.6 g/dL (ref 6.0–8.3)

## 2016-01-10 LAB — HEMOGLOBIN A1C: Hgb A1c MFr Bld: 5.7 % (ref 4.6–6.5)

## 2016-01-10 LAB — BASIC METABOLIC PANEL
BUN: 13 mg/dL (ref 6–23)
CHLORIDE: 102 meq/L (ref 96–112)
CO2: 36 meq/L — AB (ref 19–32)
CREATININE: 0.66 mg/dL (ref 0.40–1.20)
Calcium: 9.5 mg/dL (ref 8.4–10.5)
GFR: 94.39 mL/min (ref >60.00)
Glucose, Bld: 113 mg/dL — ABNORMAL HIGH (ref 70–99)
Potassium: 4.2 mEq/L (ref 3.5–5.1)
Sodium: 142 mEq/L (ref 135–145)

## 2016-01-18 ENCOUNTER — Other Ambulatory Visit: Payer: Self-pay | Admitting: Internal Medicine

## 2016-01-21 ENCOUNTER — Other Ambulatory Visit: Payer: Self-pay | Admitting: Internal Medicine

## 2016-03-13 ENCOUNTER — Other Ambulatory Visit: Payer: Self-pay | Admitting: Internal Medicine

## 2016-03-13 NOTE — Telephone Encounter (Signed)
Refilled on 01/06/2016. Last office visit 11/11/2015. Next office visit 05/11/2016.

## 2016-03-15 NOTE — Telephone Encounter (Signed)
ok'd rx for clonazepam #30 with one refill.   

## 2016-03-16 NOTE — Telephone Encounter (Signed)
Faxed into pharmacy.  

## 2016-03-19 ENCOUNTER — Ambulatory Visit (INDEPENDENT_AMBULATORY_CARE_PROVIDER_SITE_OTHER): Payer: Medicare PPO | Admitting: Internal Medicine

## 2016-03-19 ENCOUNTER — Encounter: Payer: Self-pay | Admitting: Internal Medicine

## 2016-03-19 DIAGNOSIS — J069 Acute upper respiratory infection, unspecified: Secondary | ICD-10-CM

## 2016-03-19 DIAGNOSIS — J4520 Mild intermittent asthma, uncomplicated: Secondary | ICD-10-CM

## 2016-03-19 MED ORDER — PREDNISONE 10 MG PO TABS: ORAL_TABLET | ORAL | 0 refills | Status: DC

## 2016-03-19 NOTE — Progress Notes (Signed)
[name], female   DOB: Oct 24, 1945, 71 y.o.   MRN: [id number]   Subjective:    [name], female    DOB: 08-30-1945, 71 y.o.   MRN: [id number]  HPI  Patient here as a work in with concerns regarding increased cough and congestion.  She reported that symptoms started over the last few days.  Had been having some allergy issues previously.  Now with increased congestion and increased cough.  Having coughing fits.  Using her inhalers.  Eating.  No fever.  No nausea or vomiting.     Past Medical History:  Diagnosis Date  . Arthritis   . Asthma   . Bronchitis   . Chronic headaches   . COPD (chronic obstructive pulmonary disease) ([place])   . Depression   . Gastritis   . GERD (gastroesophageal reflux disease)   . Hypertension   . Scoliosis   . Ulcer [facility])    Past Surgical History:  Procedure Laterality Date  . ABDOMINAL HYSTERECTOMY    . CHOLECYSTECTOMY  1996  . DILATION AND CURETTAGE OF UTERUS  1971  . [place]  . PARTIAL HYSTERECTOMY  1981   prolapsed uterus  . TUBAL LIGATION  1978   Family History  Problem Relation Age of Onset  . Stroke Mother   . CVA Maternal Grandmother   . Breast cancer Neg Hx   . Colon cancer Neg Hx    Social History   Social History  . Marital status: Married    Spouse name: N/A  . Number of children: 2  . Years of education: N/A   Occupational History  . retired Pharmacist, hospital    Social History Main Topics  . Smoking status: Never Smoker  . Smokeless tobacco: Never Used  . Alcohol use No  . Drug use: No  . Sexual activity: Not Asked   Other Topics Concern  . None   Social History Narrative  . None    Outpatient Encounter Prescriptions as of 03/19/2016  Medication Sig  . citalopram (CELEXA) 40 MG tablet Take 1 tablet (40 mg total) by mouth daily.  . clonazePAM (KLONOPIN) 0.5 MG tablet TAKE ONE HALF TO ONE TABLET DAILY AS NEEDED.  . fluticasone (FLONASE) 50 MCG/ACT nasal spray Place 2  sprays into the nose daily as needed.  . hydrochlorothiazide (HYDRODIURIL) 25 MG tablet Take 1 tablet (25 mg total) by mouth daily.  Marland Kitchen ipratropium (ATROVENT) 0.02 % nebulizer solution Take 2.5 mLs (0.5 mg total) by nebulization 4 (four) times daily.  Marland Kitchen levalbuterol (XOPENEX HFA) 45 MCG/ACT inhaler Inhale 1-2 puffs into the lungs every 6 (six) hours as needed.  . pantoprazole (PROTONIX) 40 MG tablet Take 1 tablet (40 mg total) by mouth daily.  . pravastatin (PRAVACHOL) 10 MG tablet Take 1 tablet (10 mg total) by mouth daily.  . predniSONE (DELTASONE) 10 MG tablet Take 6 tablets x 1 day and then decrease by 1/2 tablet per day until down to zero mg.  . Probiotic Product (PROBIOTIC DAILY PO) Take by mouth.  . ranitidine (ZANTAC) 150 MG tablet Take 150 mg by mouth at bedtime.  Marland Kitchen SPIRIVA HANDIHALER 18 MCG inhalation capsule Place 1 capsule (18 mcg total) into inhaler and inhale daily.  . [DISCONTINUED] cefdinir (OMNICEF) 300 MG capsule Take 1 capsule (300 mg total) by mouth 2 (two) times daily.  . [DISCONTINUED] predniSONE (DELTASONE) 10 MG tablet Take 6 tablets x 1 day and then decrease  by 1/2 tablet per day until down to zero mg.   No facility-administered encounter medications on file as of 03/19/2016.     Review of Systems  Constitutional: Negative for appetite change and fever.  HENT: Positive for congestion. Negative for sinus pain.   Respiratory: Positive for cough and wheezing. Negative for chest tightness.   Cardiovascular: Negative for chest pain, palpitations and leg swelling.  Gastrointestinal: Negative for abdominal pain, diarrhea, nausea and vomiting.  Skin: Negative for color change and rash.  Neurological: Negative for dizziness, light-headedness and headaches.       Objective:    Physical Exam  Constitutional: She appears well-developed and well-nourished. No distress.  HENT:  Nose: Nose normal.  Mouth/Throat: Oropharynx is clear and moist.  Neck: Neck supple.    Cardiovascular: Normal rate and regular rhythm.   Pulmonary/Chest: Breath sounds normal. No respiratory distress.  Increased cough with expiration/forced expiration.   Lymphadenopathy:    She has no cervical adenopathy.  Skin: No rash noted. No erythema.    BP 128/62 (BP Location: Left Arm, Patient Position: Sitting, Cuff Size: Large)   Pulse 83   Temp 98.6 F (37 C) (Oral)   Resp 16   Ht 5\' 1"  (1.549 m)   Wt 167 lb 12.8 oz (76.1 kg)   SpO2 96%   BMI 31.71 kg/m  Wt Readings from Last 3 Encounters:  03/23/16 166 lb (75.3 kg)  03/19/16 167 lb 12.8 oz (76.1 kg)  11/11/15 169 lb (76.7 kg)     Lab Results  Component Value Date   WBC 7.9 06/14/2015   HGB 14.1 06/14/2015   HCT 42.2 06/14/2015   PLT 230.0 06/14/2015   GLUCOSE 113 (H) 01/10/2016   CHOL 190 01/10/2016   TRIG 101.0 01/10/2016   HDL 62.50 01/10/2016   LDLDIRECT 148.4 11/03/2012   LDLCALC 107 (H) 01/10/2016   ALT 12 01/10/2016   AST 18 01/10/2016   NA 142 01/10/2016   K 4.2 01/10/2016   CL 102 01/10/2016   CREATININE 0.66 01/10/2016   BUN 13 01/10/2016   CO2 36 (H) 01/10/2016   TSH 1.04 06/14/2015   HGBA1C 5.7 01/10/2016    Mm Digital Screening Bilateral  Result Date: 07/30/2015 CLINICAL DATA:  Screening. EXAM: DIGITAL SCREENING BILATERAL MAMMOGRAM WITH CAD COMPARISON:  Previous exam(s). ACR Breast Density Category c: The breast tissue is heterogeneously dense, which may obscure small masses. FINDINGS: There are no findings suspicious for malignancy. Images were processed with CAD. IMPRESSION: No mammographic evidence of malignancy. A result letter of this screening mammogram will be mailed directly to the patient. RECOMMENDATION: Screening mammogram in one year. (Code:SM-B-01Y) BI-RADS CATEGORY  1: Negative. Electronically Signed   By: Dorise Bullion III M.D   On: 07/30/2015 15:45       Assessment & Plan:   Problem List Items Addressed This Visit    Asthma    Treat current flare as outlined.   Continue inhalers as directed.  Follow.       Relevant Medications   predniSONE (DELTASONE) 10 MG tablet   URI (upper respiratory infection)    Increased cough and congestion as outlined.  Continue inhalers as directed.  Saline nasal spray and nasacort as directed - if increased nasal congestion.  Has nebulizer at home if needed.  Prednisone taper as directed.  Follow closely.  Notify me if any change or worsening symptoms.           [name], MD

## 2016-03-19 NOTE — Progress Notes (Signed)
Pre-visit discussion using our clinic review tool. No additional management support is needed unless otherwise documented below in the visit note.  

## 2016-03-20 ENCOUNTER — Telehealth: Payer: Self-pay | Admitting: *Deleted

## 2016-03-20 MED ORDER — DOXYCYCLINE HYCLATE 100 MG PO TABS: 100.0000 mg | ORAL_TABLET | Freq: Two times a day (BID) | ORAL | 0 refills | Status: AC

## 2016-03-20 NOTE — Telephone Encounter (Signed)
Left message to call.

## 2016-03-20 NOTE — Telephone Encounter (Signed)
Called patient back confirmed no antibiotic drug allergies.  Script sent in for doxycycline.   Advised patient of below.  She states she take probiotic on daily basis and will continue to take while on antibiotic.

## 2016-03-20 NOTE — Telephone Encounter (Signed)
Patient was advised to cal the office if she needed a Rx for a antibiotic. Pt was seen on 03/19/16. She has symptoms this morning of green mucus and a cough . [place] contact [phone]

## 2016-03-20 NOTE — Telephone Encounter (Signed)
Patient calls stating on Prednisone , coughing up green mucus.   States she thinks she needs antibiotic called in .   Feels like moving more into chest now.   Yesterday was just dry cough now coughing up more .  Patient wants to be called back and advised.   No fever.

## 2016-03-20 NOTE — Telephone Encounter (Signed)
Confirm with pt no antibiotic allergies.  If no, then doxycycline 100mg  bid x 10 days.  Take a probiotic while on the antibiotic and for two weeks after completing the antibiotic.

## 2016-03-23 ENCOUNTER — Telehealth: Payer: Self-pay | Admitting: Internal Medicine

## 2016-03-23 ENCOUNTER — Ambulatory Visit
Admission: EM | Admit: 2016-03-23 | Discharge: 2016-03-23 | Disposition: A | Discharge status: Home / Self Care | Payer: Medicare PPO | Attending: Family Medicine | Admitting: Family Medicine

## 2016-03-23 ENCOUNTER — Encounter: Payer: Self-pay | Admitting: Emergency Medicine

## 2016-03-23 DIAGNOSIS — R42 Dizziness and giddiness: Secondary | ICD-10-CM

## 2016-03-23 DIAGNOSIS — H8110 Benign paroxysmal vertigo, unspecified ear: Secondary | ICD-10-CM

## 2016-03-23 MED ORDER — MECLIZINE HCL 25 MG PO TABS: 25.0000 mg | ORAL_TABLET | Freq: Three times a day (TID) | ORAL | 0 refills | Status: DC | PRN

## 2016-03-23 NOTE — Telephone Encounter (Signed)
Does she have a history of inner ear?  With room spinning, could be inner ear.  If acute issues, needs to be evaluated.

## 2016-03-23 NOTE — Telephone Encounter (Signed)
Patient states when she lays down feels like the room is spinning since Friday, symptoms present not as bad today She is being careful changing positions.    Congestion symptoms  getting better.   Wondering if this is inner ear.   Please advise.

## 2016-03-23 NOTE — ED Triage Notes (Signed)
Patient c/o cough that started last week.  Patient states that she is currently being treated for bronchitis.  Patient states that her dizziness started Friday night.  Patient reports history vertigo.

## 2016-03-23 NOTE — Telephone Encounter (Signed)
Pt called and stated that when she lays down she feels like she needs to hold on because the room starts spinning. Please advise, thank you!  Call pt @ [phone]

## 2016-03-23 NOTE — Telephone Encounter (Signed)
Pt called to inform that she has Vertigo. Thank you!

## 2016-03-23 NOTE — Telephone Encounter (Signed)
Patient advised of below she will go to [facility] to be evaluated and follow up with [place].

## 2016-03-23 NOTE — ED Provider Notes (Signed)
[facility]    CSN: [id number] Arrival date & time: 03/23/16  1649     History   Chief Complaint Chief Complaint  Patient presents with  . Dizziness    HPI Jennifer [name] is a 71 y.o. female.   The history is provided by the patient.  Dizziness  Quality:  Room spinning and vertigo Severity:  Moderate Onset quality:  Sudden Duration:  3 days Timing:  Intermittent Progression:  Unchanged Chronicity:  New Context: head movement   Context: not when bending over, not with bowel movement, not with ear pain, not with eye movement, not with inactivity, not with loss of consciousness, not with medication, not with physical activity, not when standing up and not when urinating   Relieved by:  None tried Ineffective treatments:  None tried Associated symptoms: no blood in stool, no chest pain, no diarrhea, no headaches, no hearing loss, no nausea, no palpitations, no shortness of breath, no syncope, no tinnitus, no vision changes, no vomiting and no weakness   Risk factors: hx of vertigo   Risk factors: no anemia, no heart disease, no hx of stroke, no Meniere's disease, no multiple medications and no new medications     Past Medical History:  Diagnosis Date  . Arthritis   . Asthma   . Bronchitis   . Chronic headaches   . COPD (chronic obstructive pulmonary disease) ([place])   . Depression   . Gastritis   . GERD (gastroesophageal reflux disease)   . Hypertension   . Scoliosis   . Ulcer [facility])     Patient Active Problem List   Diagnosis Date Noted  . Left low back pain 05/22/2015  . Left hip pain 05/22/2015  . SOB (shortness of breath) 11/11/2014  . Right shoulder pain 07/16/2014  . Health care maintenance 06/03/2014  . Arm skin lesion, left 06/18/2013  . Conjunctivitis 06/07/2013  . URI (upper respiratory infection) 03/03/2013  . Hyperglycemia 11/12/2012  . Hypercholesterolemia 11/12/2012  . Essential hypertension, benign 05/12/2012  . GERD (gastroesophageal  reflux disease) 05/12/2012  . Gastritis 05/12/2012  . History of colonic polyps 05/12/2012  . Asthma 12/13/2011  . Chronic restrictive lung disease 05/11/2011    Past Surgical History:  Procedure Laterality Date  . ABDOMINAL HYSTERECTOMY    . CHOLECYSTECTOMY  1996  . DILATION AND CURETTAGE OF UTERUS  1971  . [place]  . PARTIAL HYSTERECTOMY  1981   prolapsed uterus  . TUBAL LIGATION  1978    OB History    No data available       Home Medications    Prior to Admission medications   Medication Sig Start Date End Date Taking? Authorizing Provider  citalopram (CELEXA) 40 MG tablet Take 1 tablet (40 mg total) by mouth daily. 01/21/16   [name], MD  clonazePAM (KLONOPIN) 0.5 MG tablet TAKE ONE HALF TO ONE TABLET DAILY AS NEEDED. 03/15/16   [name], MD  doxycycline (VIBRA-TABS) 100 MG tablet Take 1 tablet (100 mg total) by mouth 2 (two) times daily. 03/20/16 03/30/16  [name], MD  fluticasone (FLONASE) 50 MCG/ACT nasal spray Place 2 sprays into the nose daily as needed. 12/10/11   [name], MD  hydrochlorothiazide (HYDRODIURIL) 25 MG tablet Take 1 tablet (25 mg total) by mouth daily. 09/24/15   [name], MD  ipratropium (ATROVENT) 0.02 % nebulizer solution Take 2.5 mLs (0.5 mg total) by nebulization 4 (four) times daily. 10/28/15  [name], MD  levalbuterol [facility] HFA) 45 MCG/ACT inhaler Inhale 1-2 puffs into the lungs every 6 (six) hours as needed.    Historical Provider, MD  meclizine (ANTIVERT) 25 MG tablet Take 1 tablet (25 mg total) by mouth 3 (three) times daily as needed for dizziness. 03/23/16   Norval Gable, MD  pantoprazole (PROTONIX) 40 MG tablet Take 1 tablet (40 mg total) by mouth daily. 01/21/16   [name], MD  pravastatin (PRAVACHOL) 10 MG tablet Take 1 tablet (10 mg total) by mouth daily. 07/26/15   [name], MD  predniSONE (DELTASONE) 10 MG tablet Take 6 tablets x 1 day and then decrease by 1/2 tablet per  day until down to zero mg. 03/19/16   [name], MD  Probiotic Product (PROBIOTIC DAILY PO) Take by mouth.    Historical Provider, MD  ranitidine (ZANTAC) 150 MG tablet Take 150 mg by mouth at bedtime.    Historical Provider, MD  SPIRIVA HANDIHALER 18 MCG inhalation capsule Place 1 capsule (18 mcg total) into inhaler and inhale daily. 12/19/15   [name], MD    Family History Family History  Problem Relation Age of Onset  . Stroke Mother   . CVA Maternal Grandmother   . Breast cancer Neg Hx   . Colon cancer Neg Hx     Social History Social History  Substance Use Topics  . Smoking status: Never Smoker  . Smokeless tobacco: Never Used  . Alcohol use No     Allergies   Advair diskus [fluticasone-salmeterol] and Codeine   Review of Systems Review of Systems  HENT: Negative for hearing loss and tinnitus.   Respiratory: Negative for shortness of breath.   Cardiovascular: Negative for chest pain, palpitations and syncope.  Gastrointestinal: Negative for blood in stool, diarrhea, nausea and vomiting.  Neurological: Positive for dizziness. Negative for weakness and headaches.     Physical Exam Triage Vital Signs ED Triage Vitals  Enc Vitals Group     BP 03/23/16 1717 122/90     Pulse Rate 03/23/16 1717 84     Resp 03/23/16 1717 16     Temp 03/23/16 1717 98.1 F (36.7 C)     Temp Source 03/23/16 1717 Oral     SpO2 03/23/16 1717 98 %     Weight 03/23/16 1716 166 lb (75.3 kg)     Height 03/23/16 1716 5\' 1"  (1.549 m)     Head Circumference --      Peak Flow --      Pain Score 03/23/16 1717 0     Pain Loc --      Pain Edu? --      Excl. in [place]? --    No data found.   Updated Vital Signs BP 122/90 (BP Location: Right Arm)   Pulse 84   Temp 98.1 F (36.7 C) (Oral)   Resp 16   Ht 5\' 1"  (1.549 m)   Wt 166 lb (75.3 kg)   SpO2 98%   BMI 31.37 kg/m   Visual Acuity Right Eye Distance:   Left Eye Distance:   Bilateral Distance:    Right Eye Near:   Left  Eye Near:    Bilateral Near:     Physical Exam  Constitutional: She is oriented to person, place, and time. She appears well-developed and well-nourished. No distress.  HENT:  Head: Normocephalic and atraumatic.  Right Ear: Tympanic membrane, external ear and ear canal normal.  Left Ear: Tympanic membrane, external ear and  ear canal normal.  Nose: Mucosal edema present. No rhinorrhea, nose lacerations, sinus tenderness, nasal deformity, septal deviation or nasal septal hematoma. No epistaxis.  No foreign bodies. Right sinus exhibits no maxillary sinus tenderness and no frontal sinus tenderness. Left sinus exhibits no maxillary sinus tenderness and no frontal sinus tenderness.  Mouth/Throat: Uvula is midline, oropharynx is clear and moist and mucous membranes are normal. No oropharyngeal exudate.  Eyes: Conjunctivae and EOM are normal. Pupils are equal, round, and reactive to light. Right eye exhibits no discharge. Left eye exhibits no discharge. No scleral icterus.  Neck: Normal range of motion. Neck supple. No thyromegaly present.  Cardiovascular: Normal rate, regular rhythm and normal heart sounds.   Pulmonary/Chest: Effort normal and breath sounds normal. No respiratory distress. She has no wheezes. She has no rales.  Lymphadenopathy:    She has no cervical adenopathy.  Neurological: She is alert and oriented to person, place, and time. She displays normal reflexes. No cranial nerve deficit or sensory deficit. She exhibits normal muscle tone. Coordination normal.  Positive Hallpike maneuver  Skin: She is not diaphoretic.  Nursing note and vitals reviewed.    UC Treatments / Results  Labs (all labs ordered are listed, but only abnormal results are displayed) Labs Reviewed - No data to display  EKG  EKG Interpretation None       Radiology No results found.  Procedures Procedures (including critical care time)  Medications Ordered in UC Medications - No data to  display   Initial Impression / Assessment and Plan / UC Course  I have reviewed the triage vital signs and the nursing notes.  Pertinent labs & imaging results that were available during my care of the patient were reviewed by me and considered in my medical decision making (see chart for details).       Final Clinical Impressions(s) / UC Diagnoses   Final diagnoses:  Benign paroxysmal positional vertigo, unspecified laterality    New Prescriptions Discharge Medication List as of 03/23/2016  6:04 PM    START taking these medications   Details  meclizine (ANTIVERT) 25 MG tablet Take 1 tablet (25 mg total) by mouth 3 (three) times daily as needed for dizziness., Starting Mon 03/23/2016, Normal       1. diagnosis reviewed with patient 2. rx as per orders above; reviewed possible side effects, interactions, risks and benefits  3. Recommend supportive treatment with modified Epley maneuver exercises 4. Follow-up prn if symptoms worsen or don't improve   Norval Gable, MD 03/23/16 [phone]

## 2016-03-23 NOTE — Telephone Encounter (Signed)
Spoke with patient advised of below of below.   States she was diagnosed with inner ear years ago.  Wants to come see you.   Refuses to see anyone else or go to urgent care . Advised patient may want to go to urgent care for evaluation . Please advise.

## 2016-03-23 NOTE — Telephone Encounter (Signed)
I am unable to work in today (and already working in during lunch tomorrow).  Given symptoms, would recommend her going ahead and being evaluated to confirm nothing more acute going on.  To acute care for evaluation and then can f/u here after.

## 2016-03-24 NOTE — Telephone Encounter (Signed)
Spoke with patient and advised of below.   She states years ago she had ? Vestibular rehab wondering if she would be candidate for that.

## 2016-03-24 NOTE — Telephone Encounter (Signed)
Ok.  Keep [place] posted and let us know if she needs anything.

## 2016-03-25 ENCOUNTER — Telehealth: Payer: Self-pay | Admitting: Internal Medicine

## 2016-03-25 NOTE — Telephone Encounter (Signed)
Spoke to pt to get scheduled for  AWV. Pt is not feeling well and request I call back next week.

## 2016-03-25 NOTE — Telephone Encounter (Signed)
I have placed the order for ENT referral.  Someone should be contacting her with an appt date and time.  See previous message.  Not sure why she was calling back.

## 2016-03-25 NOTE — Telephone Encounter (Signed)
What I would like to do is confirm diagnosis.  If she is agreeable, I would like to refer to ENT for evaluation and testing to confirm diagnosis.  Let me know if agreeable and I will place the order for the referral.

## 2016-03-25 NOTE — Telephone Encounter (Signed)
Order placed for ENT referral.   

## 2016-03-25 NOTE — Telephone Encounter (Signed)
Patient advised referral placed in system and someone will be calling her with appointment date and time once scheduled.

## 2016-03-25 NOTE — Telephone Encounter (Signed)
Patient advised of below she is agreeable would like referral to ENT

## 2016-03-25 NOTE — Telephone Encounter (Signed)
Tried [place] manager not setup

## 2016-03-25 NOTE — Telephone Encounter (Signed)
Pt called back wanting to have the nurse call her back. Thank you!

## 2016-03-25 NOTE — Telephone Encounter (Signed)
Left message to call.

## 2016-03-26 ENCOUNTER — Telehealth: Payer: Self-pay

## 2016-03-26 NOTE — Telephone Encounter (Signed)
Courtesy call back completed today for patient's recent visit at [facility]. Patient did not answer, left message on machine to call back with any questions or concerns.   

## 2016-03-27 ENCOUNTER — Encounter: Payer: Self-pay | Admitting: Internal Medicine

## 2016-03-27 NOTE — Assessment & Plan Note (Signed)
Increased cough and congestion as outlined.  Continue inhalers as directed.  Saline nasal spray and nasacort as directed - if increased nasal congestion.  Has nebulizer at home if needed.  Prednisone taper as directed.  Follow closely.  Notify me if any change or worsening symptoms.

## 2016-03-27 NOTE — Assessment & Plan Note (Signed)
Treat current flare as outlined.  Continue inhalers as directed.  Follow.

## 2016-05-01 ENCOUNTER — Other Ambulatory Visit: Payer: Self-pay | Admitting: Internal Medicine

## 2016-05-04 ENCOUNTER — Other Ambulatory Visit: Payer: Self-pay | Admitting: Internal Medicine

## 2016-05-04 ENCOUNTER — Telehealth: Payer: Self-pay | Admitting: *Deleted

## 2016-05-04 NOTE — Telephone Encounter (Signed)
Medication: Klonopin 0.5 mg  Directions: 1/2 to 1 po qd PNR  Last given: 03/15/16 #30 Number refills: 1  Last o/v: 03/19/16 URI  Follow up: 07-23-16

## 2016-05-04 NOTE — Telephone Encounter (Signed)
Patient requested a medication refill for clonazepam  [place]

## 2016-05-04 NOTE — Telephone Encounter (Signed)
Patient has requested a follow up this medication refill  Pt contact [phone]

## 2016-05-05 MED ORDER — CLONAZEPAM 0.5 MG PO TABS: ORAL_TABLET | ORAL | 1 refills | Status: DC

## 2016-05-05 NOTE — Telephone Encounter (Signed)
Faxed

## 2016-05-05 NOTE — Telephone Encounter (Signed)
ok'd rx for clonazepam #30 with one refill.   

## 2016-05-11 ENCOUNTER — Ambulatory Visit: Payer: Medicare PPO | Admitting: Internal Medicine

## 2016-05-12 NOTE — Telephone Encounter (Signed)
Scheduled 06/23/16

## 2016-05-22 ENCOUNTER — Other Ambulatory Visit: Payer: Self-pay | Admitting: Internal Medicine

## 2016-06-22 ENCOUNTER — Other Ambulatory Visit: Payer: Self-pay | Admitting: Internal Medicine

## 2016-06-22 DIAGNOSIS — Z1231 Encounter for screening mammogram for malignant neoplasm of breast: Secondary | ICD-10-CM

## 2016-06-23 ENCOUNTER — Ambulatory Visit (INDEPENDENT_AMBULATORY_CARE_PROVIDER_SITE_OTHER): Payer: Medicare PPO

## 2016-06-23 VITALS — BP 124/80 | HR 70 | Temp 98.1°F | Resp 14 | Ht 61.0 in | Wt 167.8 lb

## 2016-06-23 DIAGNOSIS — E2839 Other primary ovarian failure: Secondary | ICD-10-CM

## 2016-06-23 DIAGNOSIS — Z0000 Encounter for general adult medical examination without abnormal findings: Secondary | ICD-10-CM | POA: Diagnosis not present

## 2016-06-23 NOTE — Progress Notes (Signed)
Subjective:   Jennifer [name] is a 71 y.o. female who presents for an Initial Medicare Annual Wellness Visit.  Review of Systems    No ROS.  Medicare Wellness Visit.  Cardiac Risk Factors include: advanced age (>75men, >84 women);hypertension;obesity (BMI >30kg/m2)     Objective:    Today's Vitals   06/23/16 1300  BP: 124/80  Pulse: 70  Resp: 14  Temp: 98.1 F (36.7 C)  TempSrc: Oral  SpO2: 95%  Weight: 167 lb 12.8 oz (76.1 kg)  Height: 5\' 1"  (1.549 m)   Body mass index is 31.71 kg/m.   Current Medications (verified) Outpatient Encounter Prescriptions as of 06/23/2016  Medication Sig  . citalopram (CELEXA) 40 MG tablet Take 1 tablet (40 mg total) by mouth daily.  . clonazePAM (KLONOPIN) 0.5 MG tablet TAKE ONE-HALF TO ONE TABLET AS NEEDED.  . fluticasone (FLONASE) 50 MCG/ACT nasal spray Place 2 sprays into the nose daily as needed.  . hydrochlorothiazide (HYDRODIURIL) 25 MG tablet Take 1 tablet (25 mg total) by mouth daily.  Marland Kitchen ipratropium (ATROVENT) 0.02 % nebulizer solution Take 2.5 mLs (0.5 mg total) by nebulization 4 (four) times daily.  Marland Kitchen levalbuterol (XOPENEX HFA) 45 MCG/ACT inhaler Inhale 1-2 puffs into the lungs every 6 (six) hours as needed.  . meclizine (ANTIVERT) 25 MG tablet Take 1 tablet (25 mg total) by mouth 3 (three) times daily as needed for dizziness.  . pantoprazole (PROTONIX) 40 MG tablet Take 1 tablet (40 mg total) by mouth daily.  . pravastatin (PRAVACHOL) 10 MG tablet Take 1 tablet (10 mg total) by mouth daily.  . predniSONE (DELTASONE) 10 MG tablet Take 6 tablets x 1 day and then decrease by 1/2 tablet per day until down to zero mg.  . Probiotic Product (PROBIOTIC DAILY PO) Take by mouth.  . ranitidine (ZANTAC) 150 MG tablet Take 150 mg by mouth at bedtime.  Marland Kitchen SPIRIVA HANDIHALER 18 MCG inhalation capsule Place 1 capsule (18 mcg total) into inhaler and inhale daily.   No facility-administered encounter medications on file as of 06/23/2016.      Allergies (verified) Advair diskus [fluticasone-salmeterol] and Codeine   History: Past Medical History:  Diagnosis Date  . Arthritis   . Asthma   . Bronchitis   . Chronic headaches   . COPD (chronic obstructive pulmonary disease) ([place])   . Depression   . Gastritis   . GERD (gastroesophageal reflux disease)   . Hypertension   . Scoliosis   . Ulcer    Past Surgical History:  Procedure Laterality Date  . ABDOMINAL HYSTERECTOMY    . CHOLECYSTECTOMY  1996  . DILATION AND CURETTAGE OF UTERUS  1971  . [place]  . PARTIAL HYSTERECTOMY  1981   prolapsed uterus  . TUBAL LIGATION  1978   Family History  Problem Relation Age of Onset  . Stroke Mother   . Cancer Mother        Brain   . CVA Maternal Grandmother   . Cancer Father   . Breast cancer Neg Hx   . Colon cancer Neg Hx    Social History   Occupational History  . retired Pharmacist, hospital    Social History Main Topics  . Smoking status: Never Smoker  . Smokeless tobacco: Never Used  . Alcohol use No  . Drug use: No  . Sexual activity: Not on file    Tobacco Counseling Counseling given: Not Answered   Activities of Daily Living  In your present state of health, do you have any difficulty performing the following activities: 06/23/2016  Hearing? N  Vision? N  Difficulty concentrating or making decisions? N  Walking or climbing stairs? Y  Dressing or bathing? N  Doing errands, shopping? N  Preparing Food and eating ? N  Using the Toilet? N  In the past six months, have you accidently leaked urine? N  Do you have problems with loss of bowel control? N  Managing your Medications? N  Managing your Finances? N  Housekeeping or managing your Housekeeping? N  Some recent data might be hidden    Immunizations and Health Maintenance Immunization History  Administered Date(s) Administered  . Influenza Split 09/20/2013  . Influenza-Unspecified 09/17/2014, 10/10/2015  . Zoster 10/19/2013,  05/16/2015   Health Maintenance Due  Topic Date Due  . Hepatitis C Screening  02/09/1945  . TETANUS/TDAP  01/24/1966  . DEXA SCAN  01/25/2012  . PNA vac Low Risk Adult (1 of 2 - PCV13) 01/25/2012    Patient Care Team: [name], MD as PCP - General (Internal Medicine)  Indicate any recent Medical Services you may have received from other than [facility] providers in the past year (date may be approximate).     Assessment:   This is a routine wellness examination for Jennifer [name]. The goal of the wellness visit is to assist the patient how to close the gaps in care and create a preventative care plan for the patient.   Osteoporosis risk reviewed.  Medications reviewed; taking without issues or barriers.  Safety issues reviewed; smoke detectors in the home. No firearms in the home.  Wears seatbelts when driving or riding with others. Patient does wear sunscreen or protective clothing when in direct sunlight. No violence in the home.  Patient is alert, normal appearance, oriented to person/place/and time. Correctly identified the president of the [place], recall of 3/3 words, and performing simple calculations.  Patient displays appropriate judgement and can read correct time from watch face.  No new identified risk were noted.  No failures at ADL's or IADL's.   BMI- discussed the importance of a healthy diet, water intake and exercise. Educational material provided.   24 hour diet recall: Breakfast: Muffin  Lunch: Large pretzel  Dinner: Biscuit, egg, sausage  Snack: trail mix Daily fluid intake: 2 cups of caffeine, 2 cups of water  HTN- followed by PCP.  Dental- every six months.  [place].    Vertigo- Last episode 2 weeks ago.  Sleep patterns- Sleeps 7-8 hours at night.  Wakes feeling rested. Oxygen set at 2L nightly.  Hepatitis C Screening discussed. Educational material provided.  Colonoscopy completed, see care everywhere.  Dexa Scan ordered; follow as directed.   Educational material provided.  TDAP vaccine deferred per patient preference.  Follow up with insurance.  Educational material provided.  Patient Concerns: None at this time. Follow up with PCP as needed.  Hearing/Vision screen Hearing Screening Comments: Patient is able to hear conversational tones without difficulty.  No issues reported.   Vision Screening Comments: Followed by [facility] Wears corrective lenses Last OV 05/2016 Visual acuity not assessed per patient preference since they have regular follow up with the ophthalmologist  Dietary issues and exercise activities discussed: Current Exercise Habits: Home exercise routine, Type of exercise: calisthenics (Exercise stepper), Time (Minutes): 10, Frequency (Times/Week): 2, Weekly Exercise (Minutes/Week): 20, Intensity: Mild  Goals    . Healthy Lifestyle          Low  carb foods Self pace with activity, increase as tolerated Stay hydrated       Depression Screen PHQ 2/9 Scores 06/23/2016 11/11/2015 10/28/2015 05/25/2014 06/15/2013 05/12/2012  PHQ - 2 Score 0 0 0 0 0 0    Fall Risk Fall Risk  06/23/2016 11/11/2015 10/28/2015 05/25/2014 06/15/2013  Falls in the past year? No No No No No    Cognitive Function: MMSE - Mini Mental State Exam 06/23/2016  Orientation to time 5  Orientation to Place 5  Registration 3  Attention/ Calculation 5  Recall 3  Language- name 2 objects 2  Language- repeat 1  Language- follow 3 step command 3  Language- read & follow direction 1  Write a sentence 1  Copy design 1  Total score 30        Screening Tests Health Maintenance  Topic Date Due  . Hepatitis C Screening  04-24-1945  . TETANUS/TDAP  01/24/1966  . DEXA SCAN  01/25/2012  . PNA vac Low Risk Adult (1 of 2 - PCV13) 01/25/2012  . MAMMOGRAM  07/29/2016  . INFLUENZA VACCINE  08/19/2016  . COLONOSCOPY  02/25/2017      Plan:    End of life planning; Advance aging; Advanced directives discussed. Copy of current  HCPOA/Living Will requested.    I have personally reviewed and noted the following in the patient's chart:   . Medical and social history . Use of alcohol, tobacco or illicit drugs  . Current medications and supplements . Functional ability and status . Nutritional status . Physical activity . Advanced directives . List of other physicians . Hospitalizations, surgeries, and ER visits in previous 12 months . Vitals . Screenings to include cognitive, depression, and falls . Referrals and appointments  In addition, I have reviewed and discussed with patient certain preventive protocols, quality metrics, and best practice recommendations. A written personalized care plan for preventive services as well as general preventive health recommendations were provided to patient.     OBrien-Blaney, Denisa L, LPN   06/21/2631    Reviewed above information.  Agree with plan.  Dr Nicki Reaper

## 2016-06-23 NOTE — Patient Instructions (Addendum)
Jennifer [name] , Thank you for taking time to come for your Medicare Wellness Visit. I appreciate your ongoing commitment to your health goals. Please review the following plan we discussed and let me know if I can assist you in the future.   Follow up with Dr. Nicki Reaper as needed.    Bring a copy of your [place] and/or Living Will to be scanned into chart.  Dexa Scan (bone density) ordered, follow as directed.  Consider Hepatits C Screening and Prevnar 13 vaccine.  Educational material provided.  Have a great day!  These are the goals we discussed: Goals    . Healthy Lifestyle          Low carb foods Self pace with activity, increase as tolerated Stay hydrated        This is a list of the screening recommended for you and due dates:  Health Maintenance  Topic Date Due  .  Hepatitis C: One time screening is recommended by Center for Disease Control  (CDC) for  adults born from [birth year] through 1963.   18-Nov-1945  . Tetanus Vaccine  01/24/1966  . DEXA scan (bone density measurement)  01/25/2012  . Pneumonia vaccines (1 of 2 - PCV13) 01/25/2012  . Mammogram  07/29/2016  . Flu Shot  08/19/2016  . Colon Cancer Screening  02/25/2017    Bone Densitometry Bone densitometry is an imaging test that uses a special X-ray to measure the amount of calcium and other minerals in your bones (bone density). This test is also known as a bone mineral density test or dual-energy X-ray absorptiometry (DXA). The test can measure bone density at your hip and your spine. It is similar to having a regular X-ray. You may have this test to:  Diagnose a condition that causes weak or thin bones (osteoporosis).  Predict your risk of a broken bone (fracture).  Determine how well osteoporosis treatment is working.  Tell a health care provider about:  Any allergies you have.  All medicines you are taking, including vitamins, herbs, eye drops, creams, and over-the-counter medicines.  Any  problems you or family members have had with anesthetic medicines.  Any blood disorders you have.  Any surgeries you have had.  Any medical conditions you have.  Possibility of pregnancy.  Any other medical test you had within the previous 14 days that used contrast material. What are the risks? Generally, this is a safe procedure. However, problems can occur and may include the following:  This test exposes you to a very small amount of radiation.  The risks of radiation exposure may be greater to unborn children.  What happens before the procedure?  Do not take any calcium supplements for 24 hours before having the test. You can otherwise eat and drink what you usually do.  Take off all metal jewelry, eyeglasses, dental appliances, and any other metal objects. What happens during the procedure?  You may lie on an exam table. There will be an X-ray generator below you and an imaging device above you.  Other devices, such as boxes or braces, may be used to position your body properly for the scan.  You will need to lie still while the machine slowly scans your body.  The images will show up on a computer monitor. What happens after the procedure? You may need more testing at a later time. This information is not intended to replace advice given to you by your health care provider. Make sure  you discuss any questions you have with your health care provider. Document Released: 01/28/2004 Document Revised: 06/13/2015 Document Reviewed: 06/15/2013 Elsevier Interactive Patient Education  2018 [employer].

## 2016-06-26 ENCOUNTER — Other Ambulatory Visit: Payer: Self-pay

## 2016-06-26 MED ORDER — CITALOPRAM HYDROBROMIDE 40 MG PO TABS: ORAL_TABLET | ORAL | 1 refills | Status: DC

## 2016-07-21 ENCOUNTER — Other Ambulatory Visit: Payer: Self-pay | Admitting: Internal Medicine

## 2016-07-23 ENCOUNTER — Ambulatory Visit: Payer: Medicare PPO | Admitting: Internal Medicine

## 2016-08-17 ENCOUNTER — Ambulatory Visit
Admission: RE | Admit: 2016-08-17 | Discharge: 2016-08-17 | Disposition: A | Discharge status: Home / Self Care | Payer: Medicare PPO | Source: Ambulatory Visit | Attending: Internal Medicine | Admitting: Internal Medicine

## 2016-08-17 DIAGNOSIS — R928 Other abnormal and inconclusive findings on diagnostic imaging of breast: Secondary | ICD-10-CM | POA: Diagnosis not present

## 2016-08-17 DIAGNOSIS — Z1231 Encounter for screening mammogram for malignant neoplasm of breast: Secondary | ICD-10-CM | POA: Diagnosis not present

## 2016-08-17 DIAGNOSIS — E2839 Other primary ovarian failure: Secondary | ICD-10-CM | POA: Diagnosis present

## 2016-08-17 DIAGNOSIS — M8588 Other specified disorders of bone density and structure, other site: Secondary | ICD-10-CM | POA: Diagnosis not present

## 2016-08-17 DIAGNOSIS — N6489 Other specified disorders of breast: Secondary | ICD-10-CM | POA: Insufficient documentation

## 2016-08-18 ENCOUNTER — Other Ambulatory Visit: Payer: Self-pay | Admitting: Internal Medicine

## 2016-08-18 ENCOUNTER — Telehealth: Payer: Self-pay | Admitting: Internal Medicine

## 2016-08-18 DIAGNOSIS — R928 Other abnormal and inconclusive findings on diagnostic imaging of breast: Secondary | ICD-10-CM

## 2016-08-18 NOTE — Telephone Encounter (Signed)
Pt called back returning your call. Thank you! °

## 2016-08-18 NOTE — Progress Notes (Signed)
Order placed for f/u right breast mammogram and ultrasound  

## 2016-08-18 NOTE — Telephone Encounter (Signed)
See result noes

## 2016-08-20 ENCOUNTER — Other Ambulatory Visit: Payer: Self-pay | Admitting: Internal Medicine

## 2016-08-20 NOTE — Telephone Encounter (Signed)
Last OV 3/18. Upcoming appt 09/11/16. Ok to refill?

## 2016-08-27 ENCOUNTER — Ambulatory Visit
Admission: RE | Admit: 2016-08-27 | Discharge: 2016-08-27 | Disposition: A | Discharge status: Home / Self Care | Payer: Medicare PPO | Source: Ambulatory Visit | Attending: Internal Medicine | Admitting: Internal Medicine

## 2016-08-27 DIAGNOSIS — R928 Other abnormal and inconclusive findings on diagnostic imaging of breast: Secondary | ICD-10-CM | POA: Insufficient documentation

## 2016-08-27 DIAGNOSIS — R599 Enlarged lymph nodes, unspecified: Secondary | ICD-10-CM | POA: Diagnosis not present

## 2016-08-29 ENCOUNTER — Other Ambulatory Visit: Payer: Self-pay | Admitting: Internal Medicine

## 2016-09-01 ENCOUNTER — Ambulatory Visit (INDEPENDENT_AMBULATORY_CARE_PROVIDER_SITE_OTHER): Payer: Medicare PPO | Admitting: Internal Medicine

## 2016-09-01 ENCOUNTER — Encounter: Payer: Self-pay | Admitting: Internal Medicine

## 2016-09-01 VITALS — BP 114/68 | HR 75 | Temp 98.5°F | Resp 18 | Wt 166.6 lb

## 2016-09-01 DIAGNOSIS — E7800 Pure hypercholesterolemia, unspecified: Secondary | ICD-10-CM

## 2016-09-01 DIAGNOSIS — J984 Other disorders of lung: Secondary | ICD-10-CM | POA: Diagnosis not present

## 2016-09-01 DIAGNOSIS — K2100 Gastro-esophageal reflux disease with esophagitis, without bleeding: Secondary | ICD-10-CM

## 2016-09-01 DIAGNOSIS — Z86010 Personal history of colonic polyps: Secondary | ICD-10-CM

## 2016-09-01 DIAGNOSIS — R42 Dizziness and giddiness: Secondary | ICD-10-CM | POA: Diagnosis not present

## 2016-09-01 DIAGNOSIS — I10 Essential (primary) hypertension: Secondary | ICD-10-CM

## 2016-09-01 DIAGNOSIS — R739 Hyperglycemia, unspecified: Secondary | ICD-10-CM | POA: Diagnosis not present

## 2016-09-01 DIAGNOSIS — J4520 Mild intermittent asthma, uncomplicated: Secondary | ICD-10-CM | POA: Diagnosis not present

## 2016-09-01 LAB — HEPATIC FUNCTION PANEL
ALK PHOS: 49 U/L (ref 39–117)
ALT: 11 U/L (ref 0–35)
AST: 18 U/L (ref 0–37)
Albumin: 4.5 g/dL (ref 3.5–5.2)
BILIRUBIN DIRECT: 0.2 mg/dL (ref 0.0–0.3)
TOTAL PROTEIN: 6.4 g/dL (ref 6.0–8.3)
Total Bilirubin: 0.5 mg/dL (ref 0.2–1.2)

## 2016-09-01 LAB — CBC WITH DIFFERENTIAL/PLATELET
BASOS ABS: 0.1 10*3/uL (ref 0.0–0.1)
Basophils Relative: 1.4 % (ref 0.0–3.0)
Eosinophils Absolute: 0.5 10*3/uL (ref 0.0–0.7)
Eosinophils Relative: 6.6 % — ABNORMAL HIGH (ref 0.0–5.0)
HCT: 43.6 % (ref 36.0–46.0)
Hemoglobin: 14.5 g/dL (ref 12.0–15.0)
LYMPHS ABS: 2.1 10*3/uL (ref 0.7–4.0)
Lymphocytes Relative: 28.9 % (ref 12.0–46.0)
MCHC: 33.2 g/dL (ref 30.0–36.0)
MCV: 94.8 fl (ref 78.0–100.0)
MONOS PCT: 6.6 % (ref 3.0–12.0)
Monocytes Absolute: 0.5 10*3/uL (ref 0.1–1.0)
NEUTROS PCT: 56.5 % (ref 43.0–77.0)
Neutro Abs: 4.2 10*3/uL (ref 1.4–7.7)
Platelets: 223 10*3/uL (ref 150.0–400.0)
RBC: 4.61 Mil/uL (ref 3.87–5.11)
RDW: 13.3 % (ref 11.5–15.5)
WBC: 7.4 10*3/uL (ref 4.0–10.5)

## 2016-09-01 LAB — LIPID PANEL
CHOL/HDL RATIO: 3
Cholesterol: 172 mg/dL (ref 0–200)
HDL: 60.4 mg/dL (ref >39.00)
LDL CALC: 81 mg/dL (ref 0–99)
NONHDL: 111.3
Triglycerides: 153 mg/dL — ABNORMAL HIGH (ref 0.0–149.0)
VLDL: 30.6 mg/dL (ref 0.0–40.0)

## 2016-09-01 LAB — BASIC METABOLIC PANEL
BUN: 9 mg/dL (ref 6–23)
CALCIUM: 9.7 mg/dL (ref 8.4–10.5)
CO2: 36 mEq/L — ABNORMAL HIGH (ref 19–32)
CREATININE: 0.62 mg/dL (ref 0.40–1.20)
Chloride: 98 mEq/L (ref 96–112)
GFR: 101.26 mL/min (ref >60.00)
Glucose, Bld: 96 mg/dL (ref 70–99)
Potassium: 4 mEq/L (ref 3.5–5.1)
Sodium: 140 mEq/L (ref 135–145)

## 2016-09-01 LAB — TSH: TSH: 1.4 u[IU]/mL (ref 0.35–4.50)

## 2016-09-01 LAB — HEMOGLOBIN A1C: HEMOGLOBIN A1C: 5.9 % (ref 4.6–6.5)

## 2016-09-01 NOTE — Assessment & Plan Note (Signed)
Blood pressure under good control.  Continue same medication regimen.  Follow pressures.  Follow metabolic panel.   

## 2016-09-01 NOTE — Assessment & Plan Note (Signed)
Low carb diet and exercise.  Follow met b and a1c.   

## 2016-09-01 NOTE — Assessment & Plan Note (Signed)
Just had colonoscopy 03/07/16.  See dictated note.

## 2016-09-01 NOTE — Assessment & Plan Note (Signed)
On pravastatin.  Low cholesterol diet and exercise.  Follow lipid panel and liver function tests.   

## 2016-09-01 NOTE — Progress Notes (Signed)
[name], female   DOB: 07/24/1945, 71 y.o.   MRN: [id number]   Subjective:    [name], female    DOB: [birth year], 71 y.o.   MRN: [id number]  HPI  Patient here for a scheduled follow up.  She feels she is doing relatively well.  Saw pulmonary 07/15/16.  Breathing stable.  Nocturnal oxygen.  No increased cough or congestion.  No acid reflux.  No abdominal pain.  Bowels moving.  Had colonoscopy 02/2016.  Planning for f/u EGD next week.  Was seen in ED with vertigo.  Evaluated by ENT.  S/p vestibular rehab.  Is better.  Feels better.  Occasionally will notice when bend over or gets up fast.  Discussed f/u treatment.  She plans to call for f/u.     Past Medical History:  Diagnosis Date  . Arthritis   . Asthma   . Bronchitis   . Chronic headaches   . COPD (chronic obstructive pulmonary disease) ([place])   . Depression   . Gastritis   . GERD (gastroesophageal reflux disease)   . Hypertension   . Scoliosis   . Ulcer    Past Surgical History:  Procedure Laterality Date  . ABDOMINAL HYSTERECTOMY    . CHOLECYSTECTOMY  1996  . DILATION AND CURETTAGE OF UTERUS  1971  . [place]  . PARTIAL HYSTERECTOMY  1981   prolapsed uterus  . TUBAL LIGATION  1978   Family History  Problem Relation Age of Onset  . Stroke Mother   . Cancer Mother        Brain   . CVA Maternal Grandmother   . Cancer Father   . Breast cancer Neg Hx   . Colon cancer Neg Hx    Social History   Social History  . Marital status: Married    Spouse name: N/A  . Number of children: 2  . Years of education: N/A   Occupational History  . retired Pharmacist, hospital    Social History Main Topics  . Smoking status: Never Smoker  . Smokeless tobacco: Never Used  . Alcohol use No  . Drug use: No  . Sexual activity: Not Asked   Other Topics Concern  . None   Social History Narrative  . None    Outpatient Encounter Prescriptions as of 09/01/2016  Medication Sig  .  albuterol (PROVENTIL) (2.5 MG/3ML) 0.083% nebulizer solution Inhale into the lungs.  . citalopram (CELEXA) 40 MG tablet Take 1 tablet (40 mg total) by mouth daily.  . clonazePAM (KLONOPIN) 0.5 MG tablet TAKE ONE-HALF TO ONE TABLET AS NEEDED.  . fluticasone (FLONASE) 50 MCG/ACT nasal spray Place 2 sprays into the nose daily as needed.  . hydrochlorothiazide (HYDRODIURIL) 25 MG tablet Take 1 tablet (25 mg total) by mouth daily.  Marland Kitchen ipratropium (ATROVENT) 0.02 % nebulizer solution Take 2.5 mLs (0.5 mg total) by nebulization 4 (four) times daily.  Marland Kitchen levalbuterol (XOPENEX HFA) 45 MCG/ACT inhaler Inhale 1-2 puffs into the lungs every 6 (six) hours as needed.  . meclizine (ANTIVERT) 25 MG tablet Take 1 tablet (25 mg total) by mouth 3 (three) times daily as needed for dizziness.  . pantoprazole (PROTONIX) 40 MG tablet Take 1 tablet (40 mg total) by mouth daily.  . pravastatin (PRAVACHOL) 10 MG tablet Take 1 tablet (10 mg total) by mouth daily.  . predniSONE (DELTASONE) 10 MG tablet Take 6 tablets x 1 day and then decrease by  1/2 tablet per day until down to zero mg.  . Probiotic Product (PROBIOTIC DAILY PO) Take by mouth.  . ranitidine (ZANTAC) 150 MG tablet Take 150 mg by mouth at bedtime.  Marland Kitchen SPIRIVA HANDIHALER 18 MCG inhalation capsule Place 1 capsule (18 mcg total) into inhaler and inhale daily.   No facility-administered encounter medications on file as of 09/01/2016.     Review of Systems  Constitutional: Negative for appetite change and unexpected weight change.  HENT: Negative for congestion and sinus pressure.   Respiratory: Negative for cough, chest tightness and shortness of breath.   Cardiovascular: Negative for chest pain, palpitations and leg swelling.  Gastrointestinal: Negative for abdominal pain, diarrhea, nausea and vomiting.  Genitourinary: Negative for difficulty urinating and dysuria.  Musculoskeletal: Negative for joint swelling and myalgias.  Skin: Negative for color change and  rash.  Neurological: Positive for dizziness. Negative for light-headedness and headaches.  Psychiatric/Behavioral: Negative for agitation and dysphoric mood.       Objective:    Physical Exam  Constitutional: She appears well-developed and well-nourished. No distress.  HENT:  Nose: Nose normal.  Mouth/Throat: Oropharynx is clear and moist.  Neck: Neck supple. No thyromegaly present.  Cardiovascular: Normal rate and regular rhythm.   Pulmonary/Chest: Breath sounds normal. No respiratory distress. She has no wheezes.  Abdominal: Soft. Bowel sounds are normal. There is no tenderness.  Musculoskeletal: She exhibits no edema or tenderness.  Lymphadenopathy:    She has no cervical adenopathy.  Skin: No rash noted. No erythema.  Psychiatric: She has a normal mood and affect. Her behavior is normal.    BP 114/68   Pulse 75   Temp 98.5 F (36.9 C) (Oral)   Resp 18   Wt 166 lb 9.6 oz (75.6 kg)   LMP  (Exact Date)   SpO2 92%   BMI 31.48 kg/m  Wt Readings from Last 3 Encounters:  09/01/16 166 lb 9.6 oz (75.6 kg)  06/23/16 167 lb 12.8 oz (76.1 kg)  03/23/16 166 lb (75.3 kg)     Lab Results  Component Value Date   WBC 7.4 09/01/2016   HGB 14.5 09/01/2016   HCT 43.6 09/01/2016   PLT 223.0 09/01/2016   GLUCOSE 96 09/01/2016   CHOL 172 09/01/2016   TRIG 153.0 (H) 09/01/2016   HDL 60.40 09/01/2016   LDLDIRECT 148.4 11/03/2012   LDLCALC 81 09/01/2016   ALT 11 09/01/2016   AST 18 09/01/2016   NA 140 09/01/2016   K 4.0 09/01/2016   CL 98 09/01/2016   CREATININE 0.62 09/01/2016   BUN 9 09/01/2016   CO2 36 (H) 09/01/2016   TSH 1.40 09/01/2016   HGBA1C 5.9 09/01/2016    Mm Diag Breast Tomo Uni Right  Result Date: 08/27/2016 CLINICAL DATA:  The patient returns after screening study for evaluation of a possible right breast asymmetry. EXAM: 2D DIGITAL DIAGNOSTIC UNILATERAL RIGHT MAMMOGRAM WITH CAD AND ADJUNCT TOMO COMPARISON:  08/17/2016 and earlier ACR Breast Density Category  c: The breast tissue is heterogeneously dense, which may obscure small masses. FINDINGS: Additional 2-D and 3-D images are performed. These views demonstrate a circumscribed oval mass in the upper midportion of the right breast, demonstrating a well-defined lucent notch compatible with benign intramammary lymph node. No mammographic evidence for malignancy. Mammographic images were processed with CAD. IMPRESSION: Persistent abnormality is a benign intramammary lymph node. No mammographic evidence for malignancy. RECOMMENDATION: Screening mammogram in one year.(Code:SM-B-01Y) I have discussed the findings and recommendations with the patient. Results  were also provided in writing at the conclusion of the visit. If applicable, a reminder letter will be sent to the patient regarding the next appointment. BI-RADS CATEGORY  2: Benign. Electronically Signed   By: Nolon Nations M.D.   On: 08/27/2016 15:01       Assessment & Plan:   Problem List Items Addressed This Visit    Asthma    Recently saw pulmonary.  Breathing stable.  No increased cough or congestion.  Follow.        Relevant Medications   albuterol (PROVENTIL) (2.5 MG/3ML) 0.083% nebulizer solution   Chronic restrictive lung disease    Seeing pulmonary.  Stable.        Essential hypertension, benign    Blood pressure under good control.  Continue same medication regimen.  Follow pressures.  Follow metabolic panel.        Relevant Orders   CBC with Differential/Platelet (Completed)   TSH (Completed)   Basic metabolic panel (Completed)   GERD (gastroesophageal reflux disease)    Scheduled for f/u EGD next week.  On protonix.  Symptoms appear to be controlled.        History of colonic polyps    Just had colonoscopy 03/07/16.  See dictated note.        Hypercholesterolemia    On pravastatin.  Low cholesterol diet and exercise.  Follow lipid panel and liver function tests.        Relevant Orders   Hepatic function panel  (Completed)   Lipid panel (Completed)   Hyperglycemia - Primary    Low carb diet and exercise.  Follow met b and a1c.        Relevant Orders   Hemoglobin A1c (Completed)   Vertigo    Evaluated by ENT.  S/p vestibular rehab.  Is better.  Still some dizziness as outlined.  Plans to f/u for another treatment.            [name], MD

## 2016-09-01 NOTE — Progress Notes (Signed)
Pre-visit discussion using our clinic review tool. No additional management support is needed unless otherwise documented below in the visit note.  

## 2016-09-02 ENCOUNTER — Encounter: Payer: Self-pay | Admitting: Internal Medicine

## 2016-09-02 DIAGNOSIS — R42 Dizziness and giddiness: Secondary | ICD-10-CM | POA: Insufficient documentation

## 2016-09-02 NOTE — Assessment & Plan Note (Signed)
Recently saw pulmonary.  Breathing stable.  No increased cough or congestion.  Follow.

## 2016-09-02 NOTE — Assessment & Plan Note (Signed)
Scheduled for f/u EGD next week.  On protonix.  Symptoms appear to be controlled.

## 2016-09-02 NOTE — Assessment & Plan Note (Signed)
Seeing pulmonary.  Stable.

## 2016-09-02 NOTE — Assessment & Plan Note (Signed)
Evaluated by ENT.  S/p vestibular rehab.  Is better.  Still some dizziness as outlined.  Plans to f/u for another treatment.

## 2016-09-11 ENCOUNTER — Ambulatory Visit: Payer: Medicare PPO | Admitting: Internal Medicine

## 2016-09-30 ENCOUNTER — Other Ambulatory Visit: Payer: Self-pay | Admitting: Internal Medicine

## 2016-10-19 ENCOUNTER — Other Ambulatory Visit: Payer: Self-pay | Admitting: Internal Medicine

## 2016-10-29 ENCOUNTER — Other Ambulatory Visit: Payer: Self-pay | Admitting: Internal Medicine

## 2016-11-11 ENCOUNTER — Telehealth: Payer: Self-pay | Admitting: Internal Medicine

## 2016-11-11 NOTE — Telephone Encounter (Signed)
Please schedule patient for 11/12/2016 at 12 pm with Dr. Nicki Reaper for cough, congestion. Patient is aware of date and time

## 2016-11-11 NOTE — Telephone Encounter (Signed)
Jennifer [name] from [facility] called and stated that pt was told to always let Dr. Nicki Reaper know that she is not feeling well and having issues with her asthma and is afraid that it will turn in to bronchitis. Please advise, thank you!

## 2016-11-11 NOTE — Telephone Encounter (Signed)
done

## 2016-11-11 NOTE — Telephone Encounter (Signed)
Patient Name: Jennifer [name] DOB: 1945-10-11 Initial Comment Caller she would like to make an appointment for today if possible. She is getting bronchitis. Hx of COPD. Nurse Assessment Nurse: Vallery Sa, RN, Cathy Date/Time (Eastern Time): 11/11/2016 8:07:26 AM Confirm and document reason for call. If symptomatic, describe symptoms. ---Caller states she developed a cough yesterday. No severe breathing difficulty. No chest pain. No fever. Alert and responsive. Does the patient have any new or worsening symptoms? ---Yes Will a triage be completed? ---Yes Related visit to physician within the last 2 weeks? ---No Does the PT have any chronic conditions? (i.e. diabetes, asthma, etc.) ---Yes List chronic conditions. ---COPD, Bronchitis, Scoliosis, Stomach problems, Anxiety Is this a behavioral health or substance abuse call? ---No Guidelines Guideline Title Affirmed Question Affirmed Notes Asthma Attack [1] MILD asthma attack (e.g., no SOB at rest, mild SOB with walking, speaks normally in sentences, mild wheezing) AND [2] persists > 24 hours on appropriate treatment Final Disposition User See Physician within [place], RN, T[place] Comments Jennifer [name] declined to have me check for an appointment with one of the other MDs. She states Dr. Nicki Reaper told her to always ask to be seen by her because she often needs steroid medication. Called the office backline and notified Estill Bamberg. Estill Bamberg states she will send a note to the Md and that someone from the office will call Jennifer [name] back. Jennifer [name] notified. Encouraged to call back with any new/ worsening symptoms. Referrals REFERRED TO PCP OFFICE Caller Disagree/Comply Comply Caller Understands Yes PreDisposition Call Doctor

## 2016-11-11 NOTE — Telephone Encounter (Signed)
Pt called back looking for an update. Please advise, thank you!

## 2016-11-12 ENCOUNTER — Ambulatory Visit (INDEPENDENT_AMBULATORY_CARE_PROVIDER_SITE_OTHER): Payer: Medicare PPO | Admitting: Internal Medicine

## 2016-11-12 ENCOUNTER — Ambulatory Visit (INDEPENDENT_AMBULATORY_CARE_PROVIDER_SITE_OTHER): Payer: Medicare PPO

## 2016-11-12 ENCOUNTER — Encounter: Payer: Self-pay | Admitting: Internal Medicine

## 2016-11-12 DIAGNOSIS — M79601 Pain in right arm: Secondary | ICD-10-CM | POA: Insufficient documentation

## 2016-11-12 DIAGNOSIS — I10 Essential (primary) hypertension: Secondary | ICD-10-CM

## 2016-11-12 DIAGNOSIS — J069 Acute upper respiratory infection, unspecified: Secondary | ICD-10-CM | POA: Diagnosis not present

## 2016-11-12 MED ORDER — PREDNISONE 10 MG PO TABS: ORAL_TABLET | ORAL | 0 refills | Status: DC

## 2016-11-12 MED ORDER — DOXYCYCLINE HYCLATE 100 MG PO TABS: 100.0000 mg | ORAL_TABLET | Freq: Two times a day (BID) | ORAL | 0 refills | Status: DC

## 2016-11-12 NOTE — Progress Notes (Signed)
Patient ID: [name], female   DOB: 09-Oct-1945, 71 y.o.   MRN: [id number]   Subjective:    Patient ID: [name], female    DOB: Mar 22, 1945, 71 y.o.   MRN: [id number]  HPI  Patient here as a work in with concerns regarding increased cough and congestion.  Also reports increased right arm pain.  States the cough started earlier this week.  Was hot in her house.  Feels this may have triggered.  Cough has worsened.  No sinus pressure.  No nasal congestion.  No deep chest congestion.  Increased cough.  Using neb bid.  No nausea or vomiting.  No fever.  Reports increased right arm pain.  Described as aching and tingling.  Discomfort with palpation posterior shoulder/neck.  Affecting her sleep.  Only slept 3-4 hours for the last several nights.  No known injury.     Past Medical History:  Diagnosis Date  . Arthritis   . Asthma   . Bronchitis   . Chronic headaches   . COPD (chronic obstructive pulmonary disease) ([place])   . Depression   . Gastritis   . GERD (gastroesophageal reflux disease)   . Hypertension   . Scoliosis   . Ulcer    Past Surgical History:  Procedure Laterality Date  . ABDOMINAL HYSTERECTOMY    . CHOLECYSTECTOMY  1996  . DILATION AND CURETTAGE OF UTERUS  1971  . [place]  . PARTIAL HYSTERECTOMY  1981   prolapsed uterus  . TUBAL LIGATION  1978   Family History  Problem Relation Age of Onset  . Stroke Mother   . Cancer Mother        Brain   . CVA Maternal Grandmother   . Cancer Father   . Breast cancer Neg Hx   . Colon cancer Neg Hx    Social History   Social History  . Marital status: Married    Spouse name: N/A  . Number of children: 2  . Years of education: N/A   Occupational History  . retired Pharmacist, hospital    Social History Main Topics  . Smoking status: Never Smoker  . Smokeless tobacco: Never Used  . Alcohol use No  . Drug use: No  . Sexual activity: Not Asked   Other Topics Concern  . None   Social History  Narrative  . None    Outpatient Encounter Prescriptions as of 11/12/2016  Medication Sig  . albuterol (PROVENTIL) (2.5 MG/3ML) 0.083% nebulizer solution Inhale into the lungs.  . citalopram (CELEXA) 40 MG tablet Take 1 tablet (40 mg total) by mouth daily.  . clonazePAM (KLONOPIN) 0.5 MG tablet TAKE ONE-HALF TO ONE TABLET AS NEEDED.  . fluticasone (FLONASE) 50 MCG/ACT nasal spray Place 2 sprays into the nose daily as needed.  . hydrochlorothiazide (HYDRODIURIL) 25 MG tablet Take 1 tablet (25 mg total) by mouth daily.  Marland Kitchen ipratropium (ATROVENT) 0.02 % nebulizer solution Take 2.5 mLs (0.5 mg total) by nebulization 4 (four) times daily.  Marland Kitchen levalbuterol (XOPENEX HFA) 45 MCG/ACT inhaler Inhale 1-2 puffs into the lungs every 6 (six) hours as needed.  . meclizine (ANTIVERT) 25 MG tablet Take 1 tablet (25 mg total) by mouth 3 (three) times daily as needed for dizziness.  . pantoprazole (PROTONIX) 40 MG tablet Take 1 tablet (40 mg total) by mouth daily.  . pravastatin (PRAVACHOL) 10 MG tablet Take 1 tablet (10 mg total) by mouth daily.  . predniSONE (DELTASONE) 10  MG tablet Take 6 tablets x 1 day and then decrease by 1/2 tablet per day until down to zero mg.  . Probiotic Product (PROBIOTIC DAILY PO) Take by mouth.  . ranitidine (ZANTAC) 150 MG tablet Take 150 mg by mouth at bedtime.  Marland Kitchen SPIRIVA HANDIHALER 18 MCG inhalation capsule Place 1 capsule (18 mcg total) into inhaler and inhale daily.  . [DISCONTINUED] predniSONE (DELTASONE) 10 MG tablet Take 6 tablets x 1 day and then decrease by 1/2 tablet per day until down to zero mg.  . doxycycline (VIBRA-TABS) 100 MG tablet Take 1 tablet (100 mg total) by mouth 2 (two) times daily.   No facility-administered encounter medications on file as of 11/12/2016.     Review of Systems  Constitutional: Negative for appetite change and fever.  HENT: Negative for sinus pressure and sore throat.   Respiratory: Positive for cough, chest tightness and wheezing.  Negative for shortness of breath.   Cardiovascular: Negative for chest pain, palpitations and leg swelling.  Gastrointestinal: Negative for abdominal pain, diarrhea, nausea and vomiting.  Musculoskeletal: Negative for back pain and joint swelling.       Right arm pain as outlined.    Skin: Negative for color change and rash.  Neurological: Negative for dizziness and headaches.  Psychiatric/Behavioral: Negative for agitation and dysphoric mood.       Objective:    Physical Exam  Constitutional: She appears well-developed and well-nourished. No distress.  HENT:  Nose: Nose normal.  Mouth/Throat: Oropharynx is clear and moist.  Neck: Neck supple.  Cardiovascular: Normal rate and regular rhythm.   Pulmonary/Chest: Breath sounds normal. No respiratory distress. She has no wheezes.  Increased cough with expiration and forced expiration.    Musculoskeletal:  Increased pain to palpation posterior right shoulder.  No pain with abduction and adduction.    Lymphadenopathy:    She has no cervical adenopathy.  Skin: No rash noted. No erythema.    BP 118/76 (BP Location: Left Arm, Patient Position: Sitting, Cuff Size: Large)   Pulse 76   Temp 98.1 F (36.7 C) (Oral)   Resp 16   Wt 163 lb 3.2 oz (74 kg)   SpO2 95%   BMI 30.84 kg/m  Wt Readings from Last 3 Encounters:  11/12/16 163 lb 3.2 oz (74 kg)  09/01/16 166 lb 9.6 oz (75.6 kg)  06/23/16 167 lb 12.8 oz (76.1 kg)     Lab Results  Component Value Date   WBC 7.4 09/01/2016   HGB 14.5 09/01/2016   HCT 43.6 09/01/2016   PLT 223.0 09/01/2016   GLUCOSE 96 09/01/2016   CHOL 172 09/01/2016   TRIG 153.0 (H) 09/01/2016   HDL 60.40 09/01/2016   LDLDIRECT 148.4 11/03/2012   LDLCALC 81 09/01/2016   ALT 11 09/01/2016   AST 18 09/01/2016   NA 140 09/01/2016   K 4.0 09/01/2016   CL 98 09/01/2016   CREATININE 0.62 09/01/2016   BUN 9 09/01/2016   CO2 36 (H) 09/01/2016   TSH 1.40 09/01/2016   HGBA1C 5.9 09/01/2016    Mm Diag  Breast Tomo Uni Right  Result Date: 08/27/2016 CLINICAL DATA:  The patient returns after screening study for evaluation of a possible right breast asymmetry. EXAM: 2D DIGITAL DIAGNOSTIC UNILATERAL RIGHT MAMMOGRAM WITH CAD AND ADJUNCT TOMO COMPARISON:  08/17/2016 and earlier ACR Breast Density Category c: The breast tissue is heterogeneously dense, which may obscure small masses. FINDINGS: Additional 2-D and 3-D images are performed. These views demonstrate a circumscribed  oval mass in the upper midportion of the right breast, demonstrating a well-defined lucent notch compatible with benign intramammary lymph node. No mammographic evidence for malignancy. Mammographic images were processed with CAD. IMPRESSION: Persistent abnormality is a benign intramammary lymph node. No mammographic evidence for malignancy. RECOMMENDATION: Screening mammogram in one year.(Code:SM-B-01Y) I have discussed the findings and recommendations with the patient. Results were also provided in writing at the conclusion of the visit. If applicable, a reminder letter will be sent to the patient regarding the next appointment. BI-RADS CATEGORY  2: Benign. Electronically Signed   By: Nolon Nations M.D.   On: 08/27/2016 15:01       Assessment & Plan:   Problem List Items Addressed This Visit    Essential hypertension, benign    Blood pressure under good control.  Continue same medication regimen.  Follow pressures.  Follow metabolic panel.        Right arm pain    Right posterior shoulder and arm pain as outlined.  Prednisone taper should help.  Check c-spine xray.        Relevant Orders   DG Cervical Spine 2 or 3 views (Completed)   URI (upper respiratory infection)    Increased cough and congestion.  Continue inhalers and nebs.  Doxycycline as directed.  Probiotics as directed.  Prednisone taper as directed.  Follow closely.  Hold on cxr.            [name], MD

## 2016-11-13 ENCOUNTER — Telehealth: Payer: Self-pay | Admitting: Internal Medicine

## 2016-11-13 DIAGNOSIS — R937 Abnormal findings on diagnostic imaging of other parts of musculoskeletal system: Secondary | ICD-10-CM

## 2016-11-13 DIAGNOSIS — M79601 Pain in right arm: Secondary | ICD-10-CM

## 2016-11-13 NOTE — Telephone Encounter (Signed)
Notify pt that her neck xray reveals disc space narrowing and arthritis changes.  Given her pain and xray results can schedule mri c-spine to evaluate further.  If agreeable, let me know and I will place order for mri.

## 2016-11-13 NOTE — Telephone Encounter (Signed)
Pt called back returning call. Thank you!

## 2016-11-13 NOTE — Telephone Encounter (Signed)
Pt called to see if she could get her results from her xray

## 2016-11-13 NOTE — Telephone Encounter (Signed)
Results are in EPIC, no notes yet, please advise, thanks

## 2016-11-13 NOTE — Telephone Encounter (Signed)
Spoke with the patient, reviewed results of neck xray.  Verbalized understanding, agreed to MRI of Cspine but  Can't get into a closed MRI, claustrophobic, Open MRI needed, has panic attacks.  Previously she went to [facility] radiology, nad some type of pre medication for the prior MRI's with Dr. Gilford Rile.  Please advise.  I explained that would need to place the orders today and that she may hear something next week.

## 2016-11-14 ENCOUNTER — Encounter: Payer: Self-pay | Admitting: Internal Medicine

## 2016-11-14 NOTE — Assessment & Plan Note (Signed)
Increased cough and congestion.  Continue inhalers and nebs.  Doxycycline as directed.  Probiotics as directed.  Prednisone taper as directed.  Follow closely.  Hold on cxr.

## 2016-11-14 NOTE — Assessment & Plan Note (Signed)
Blood pressure under good control.  Continue same medication regimen.  Follow pressures.  Follow metabolic panel.   

## 2016-11-14 NOTE — Assessment & Plan Note (Signed)
Right posterior shoulder and arm pain as outlined.  Prednisone taper should help.  Check c-spine xray.

## 2016-11-16 NOTE — Telephone Encounter (Signed)
Patient informed no questions she wanted to let you know that she has to have open MRI at [facility].

## 2016-11-16 NOTE — Telephone Encounter (Signed)
I have placed order for MRI.  She can take her clonazepam prior to MRI.  Will need someone to drive her.

## 2016-11-18 ENCOUNTER — Other Ambulatory Visit: Payer: Self-pay | Admitting: Internal Medicine

## 2016-11-18 NOTE — Telephone Encounter (Signed)
Received authorization for MRI from her insurance. Referral/order has been sent to  imaging for open MRI

## 2016-11-19 ENCOUNTER — Other Ambulatory Visit: Payer: Self-pay

## 2016-11-19 MED ORDER — TIOTROPIUM BROMIDE MONOHYDRATE 18 MCG IN CAPS: ORAL_CAPSULE | RESPIRATORY_TRACT | 1 refills | Status: DC

## 2016-11-20 ENCOUNTER — Encounter: Payer: Self-pay | Admitting: Internal Medicine

## 2016-11-20 ENCOUNTER — Ambulatory Visit (INDEPENDENT_AMBULATORY_CARE_PROVIDER_SITE_OTHER): Payer: Medicare PPO | Admitting: Internal Medicine

## 2016-11-20 VITALS — BP 120/74 | HR 67 | Temp 98.6°F | Resp 14 | Ht 61.0 in | Wt 162.4 lb

## 2016-11-20 DIAGNOSIS — M25511 Pain in right shoulder: Secondary | ICD-10-CM | POA: Diagnosis not present

## 2016-11-20 DIAGNOSIS — I10 Essential (primary) hypertension: Secondary | ICD-10-CM

## 2016-11-20 DIAGNOSIS — R42 Dizziness and giddiness: Secondary | ICD-10-CM

## 2016-11-20 DIAGNOSIS — R739 Hyperglycemia, unspecified: Secondary | ICD-10-CM

## 2016-11-20 DIAGNOSIS — J4520 Mild intermittent asthma, uncomplicated: Secondary | ICD-10-CM | POA: Diagnosis not present

## 2016-11-20 DIAGNOSIS — E7800 Pure hypercholesterolemia, unspecified: Secondary | ICD-10-CM

## 2016-11-20 DIAGNOSIS — K2100 Gastro-esophageal reflux disease with esophagitis, without bleeding: Secondary | ICD-10-CM

## 2016-11-20 DIAGNOSIS — Z0000 Encounter for general adult medical examination without abnormal findings: Secondary | ICD-10-CM

## 2016-11-20 DIAGNOSIS — J984 Other disorders of lung: Secondary | ICD-10-CM

## 2016-11-20 NOTE — Progress Notes (Signed)
[name], female   DOB: [birth year], 71 y.o.   MRN: [id number]   Subjective:    [name], female    DOB: [birth year], 71 y.o.   MRN: [id number]  HPI  Patient here for her physical exam.  Recently evaluated for cough and congestion.  See previous note.  Cough is better.  Completing prednisone taper.  Did start abx.  Breathing better.  No chest pain.  No sob.  No acid reflux.  No abdominal pain.  Bowels moving.  No urine change.  She has a history of inner ear issues.  States has had a recent flare.  Minimal.  Has noticed when turns on her right side, worse.  Feels like bed is spinning.  Intermittent.  Discussed referral back to ENT.  She declines.  Has previously had Epley maneuvers.  States feels like her previous episodes.     Past Medical History:  Diagnosis Date  . Arthritis   . Asthma   . Bronchitis   . Chronic headaches   . COPD (chronic obstructive pulmonary disease) ([place])   . Depression   . Gastritis   . GERD (gastroesophageal reflux disease)   . Hypertension   . Scoliosis   . Ulcer    Past Surgical History:  Procedure Laterality Date  . ABDOMINAL HYSTERECTOMY    . CHOLECYSTECTOMY  1996  . DILATION AND CURETTAGE OF UTERUS  1971  . [place]  . PARTIAL HYSTERECTOMY  1981   prolapsed uterus  . TUBAL LIGATION  1978   Family History  Problem Relation Age of Onset  . Stroke Mother   . Cancer Mother        Brain   . CVA Maternal Grandmother   . Cancer Father   . Breast cancer Neg Hx   . Colon cancer Neg Hx    Social History   Socioeconomic History  . Marital status: Married    Spouse name: None  . Number of children: 2  . Years of education: None  . Highest education level: None  Social Needs  . Financial resource strain: None  . Food insecurity - worry: None  . Food insecurity - inability: None  . Transportation needs - medical: None  . Transportation needs - non-medical: None  Occupational History  .  Occupation: retired Pharmacist, hospital  Tobacco Use  . Smoking status: Never Smoker  . Smokeless tobacco: Never Used  Substance and Sexual Activity  . Alcohol use: No    Alcohol/week: 0.0 oz  . Drug use: No  . Sexual activity: None  Other Topics Concern  . None  Social History Narrative  . None    Outpatient Encounter Medications as of 11/20/2016  Medication Sig  . albuterol (PROVENTIL) (2.5 MG/3ML) 0.083% nebulizer solution Inhale into the lungs.  . citalopram (CELEXA) 40 MG tablet Take 1 tablet (40 mg total) by mouth daily.  . clonazePAM (KLONOPIN) 0.5 MG tablet TAKE ONE-HALF TO ONE TABLET AS NEEDED.  Marland Kitchen doxycycline (VIBRA-TABS) 100 MG tablet Take 1 tablet (100 mg total) by mouth 2 (two) times daily.  . fluticasone (FLONASE) 50 MCG/ACT nasal spray Place 2 sprays into the nose daily as needed.  . hydrochlorothiazide (HYDRODIURIL) 25 MG tablet Take 1 tablet (25 mg total) by mouth daily.  Marland Kitchen ipratropium (ATROVENT) 0.02 % nebulizer solution Take 2.5 mLs (0.5 mg total) by nebulization 4 (four) times daily.  Marland Kitchen levalbuterol (XOPENEX HFA) 45 MCG/ACT inhaler Inhale 1-2  puffs into the lungs every 6 (six) hours as needed.  . meclizine (ANTIVERT) 25 MG tablet Take 1 tablet (25 mg total) by mouth 3 (three) times daily as needed for dizziness.  . pantoprazole (PROTONIX) 40 MG tablet Take 1 tablet (40 mg total) by mouth daily.  . pravastatin (PRAVACHOL) 10 MG tablet Take 1 tablet (10 mg total) by mouth daily.  . predniSONE (DELTASONE) 10 MG tablet Take 6 tablets x 1 day and then decrease by 1/2 tablet per day until down to zero mg.  . Probiotic Product (PROBIOTIC DAILY PO) Take by mouth.  . ranitidine (ZANTAC) 150 MG tablet Take 150 mg by mouth at bedtime.  Marland Kitchen tiotropium (SPIRIVA HANDIHALER) 18 MCG inhalation capsule Place 1 capsule (18 mcg total) into inhaler and inhale daily.   No facility-administered encounter medications on file as of 11/20/2016.     Review of Systems  Constitutional: Negative for  appetite change and unexpected weight change.  HENT: Positive for congestion. Negative for sinus pressure.   Eyes: Negative for pain and visual disturbance.  Respiratory: Positive for cough. Negative for chest tightness and shortness of breath.   Cardiovascular: Negative for chest pain, palpitations and leg swelling.  Gastrointestinal: Negative for abdominal pain, diarrhea, nausea and vomiting.  Genitourinary: Negative for difficulty urinating and dysuria.  Musculoskeletal: Negative for back pain and joint swelling.  Skin: Negative for color change and rash.  Neurological: Positive for dizziness. Negative for headaches.  Hematological: Negative for adenopathy. Does not bruise/bleed easily.  Psychiatric/Behavioral: Negative for agitation and dysphoric mood.       Objective:     Blood pressure rechecked by me:  118/78  Physical Exam  Constitutional: She is oriented to person, place, and time. She appears well-developed and well-nourished.  HENT:  Nose: Nose normal.  Mouth/Throat: Oropharynx is clear and moist.  Eyes: Right eye exhibits no discharge. Left eye exhibits no discharge. No scleral icterus.  Neck: Neck supple. No thyromegaly present.  Cardiovascular: Normal rate and regular rhythm.   Pulmonary/Chest: Breath sounds normal. No accessory muscle usage. No tachypnea. No respiratory distress. She has no decreased breath sounds. She has no wheezes. She has no rhonchi. Right breast exhibits no inverted nipple, no mass, no nipple discharge and no tenderness (no axillary adenopathy). Left breast exhibits no inverted nipple, no mass, no nipple discharge and no tenderness (no axilarry adenopathy).  Abdominal: Soft. Bowel sounds are normal. There is no tenderness.  Musculoskeletal: She exhibits no edema or tenderness.  Increased discomfort in her neck with looking to the right.   Lymphadenopathy:    She has no cervical adenopathy.  Neurological: She is alert and oriented to person, place,  and time.  Skin: Skin is warm. No rash noted. No erythema.  Psychiatric: She has a normal mood and affect. Her behavior is normal.    BP 120/74 (BP Location: Left Arm, Patient Position: Sitting, Cuff Size: Normal)   Pulse 67   Temp 98.6 F (37 C) (Oral)   Resp 14   Ht _0  (1.549 m)   Wt 162 lb 6.4 oz (73.7 kg)   SpO2 96%   BMI 30.69 kg/m  Wt Readings from Last 3 Encounters:  11/20/16 162 lb 6.4 oz (73.7 kg)  11/12/16 163 lb 3.2 oz (74 kg)  09/01/16 166 lb 9.6 oz (75.6 kg)     Lab Results  Component Value Date   WBC 7.4 09/01/2016   HGB 14.5 09/01/2016   HCT 43.6 09/01/2016   PLT 223.0  09/01/2016   GLUCOSE 96 09/01/2016   CHOL 172 09/01/2016   TRIG 153.0 (H) 09/01/2016   HDL 60.40 09/01/2016   LDLDIRECT 148.4 11/03/2012   LDLCALC 81 09/01/2016   ALT 11 09/01/2016   AST 18 09/01/2016   NA 140 09/01/2016   K 4.0 09/01/2016   CL 98 09/01/2016   CREATININE 0.62 09/01/2016   BUN 9 09/01/2016   CO2 36 (H) 09/01/2016   TSH 1.40 09/01/2016   HGBA1C 5.9 09/01/2016    Mm Diag Breast Tomo Uni Right  Result Date: 08/27/2016 CLINICAL DATA:  The patient returns after screening study for evaluation of a possible right breast asymmetry. EXAM: 2D DIGITAL DIAGNOSTIC UNILATERAL RIGHT MAMMOGRAM WITH CAD AND ADJUNCT TOMO COMPARISON:  08/17/2016 and earlier ACR Breast Density Category c: The breast tissue is heterogeneously dense, which may obscure small masses. FINDINGS: Additional 2-D and 3-D images are performed. These views demonstrate a circumscribed oval mass in the upper midportion of the right breast, demonstrating a well-defined lucent notch compatible with benign intramammary lymph node. No mammographic evidence for malignancy. Mammographic images were processed with CAD. IMPRESSION: Persistent abnormality is a benign intramammary lymph node. No mammographic evidence for malignancy. RECOMMENDATION: Screening mammogram in one year.(Code:SM-B-01Y) I have discussed the findings and  recommendations with the patient. Results were also provided in writing at the conclusion of the visit. If applicable, a reminder letter will be sent to the patient regarding the next appointment. BI-RADS CATEGORY  2: Benign. Electronically Signed   By: Nolon Nations M.D.   On: 08/27/2016 15:01       Assessment & Plan:   Problem List Items Addressed This Visit    Asthma    Being treated for respiratory infection.  Complete abx and prednisone taper.  Doing better.  Follow.        Chronic restrictive lung disease    Followed by pulmonary.        Essential hypertension, benign    Blood pressure under good control.  Continue same medication regimen.  Follow pressures.  Follow metabolic panel.        Relevant Orders   Basic metabolic panel   GERD (gastroesophageal reflux disease)    Controlled on current regimen.  Follow.        Health care maintenance    Physical today 11/20/16.  Colonoscopy 02/09/10.  Mammogram 08/27/16 - [facility] II.        Hypercholesterolemia    On pravastatin.  Low cholesterol diet and exercise.  Follow lipid panel and liver function tests.        Relevant Orders   Hepatic function panel   Lipid panel   Hyperglycemia    Low carb diet and exercise.  Follow met b and a1c.        Relevant Orders   Hemoglobin A1c   Right shoulder pain    C-spine xray as outlined.  Planning for MRI this weekend.  On prednisone taper.  Follow.       Vertigo    Has been evaluated by ENT previously.  S/p vestibular rehab.  Minimal flare.  Desires no further intervention at this time.  States feels similar.  Follow.         Other Visit Diagnoses    Routine general medical examination at a health care facility    -  Primary       [name], MD

## 2016-11-20 NOTE — Assessment & Plan Note (Signed)
Physical today 11/20/16.  Colonoscopy 02/09/10.  Mammogram 08/27/16 - [facility] II.

## 2016-11-22 ENCOUNTER — Ambulatory Visit
Admission: RE | Admit: 2016-11-22 | Discharge: 2016-11-22 | Disposition: A | Discharge status: Home / Self Care | Payer: Medicare PPO | Source: Ambulatory Visit | Attending: Internal Medicine | Admitting: Internal Medicine

## 2016-11-22 ENCOUNTER — Encounter: Payer: Self-pay | Admitting: Internal Medicine

## 2016-11-22 DIAGNOSIS — R937 Abnormal findings on diagnostic imaging of other parts of musculoskeletal system: Secondary | ICD-10-CM

## 2016-11-22 DIAGNOSIS — M79601 Pain in right arm: Secondary | ICD-10-CM

## 2016-11-22 NOTE — Assessment & Plan Note (Signed)
On pravastatin.  Low cholesterol diet and exercise.  Follow lipid panel and liver function tests.   

## 2016-11-22 NOTE — Assessment & Plan Note (Signed)
Has been evaluated by ENT previously.  S/p vestibular rehab.  Minimal flare.  Desires no further intervention at this time.  States feels similar.  Follow.

## 2016-11-22 NOTE — Assessment & Plan Note (Signed)
Low carb diet and exercise.  Follow met b and a1c.   

## 2016-11-22 NOTE — Assessment & Plan Note (Signed)
Controlled on current regimen.  Follow.  

## 2016-11-22 NOTE — Assessment & Plan Note (Signed)
C-spine xray as outlined.  Planning for MRI this weekend.  On prednisone taper.  Follow.

## 2016-11-22 NOTE — Assessment & Plan Note (Signed)
Being treated for respiratory infection.  Complete abx and prednisone taper.  Doing better.  Follow.

## 2016-11-22 NOTE — Assessment & Plan Note (Signed)
Followed by pulmonary 

## 2016-11-22 NOTE — Assessment & Plan Note (Signed)
Blood pressure under good control.  Continue same medication regimen.  Follow pressures.  Follow metabolic panel.   

## 2016-11-23 ENCOUNTER — Telehealth: Payer: Self-pay | Admitting: Internal Medicine

## 2016-11-23 NOTE — Telephone Encounter (Signed)
Left detailed message we will call patient as soon as results available FYI

## 2016-11-23 NOTE — Telephone Encounter (Unsigned)
Copied from [place] [id number]. Topic: Inquiry >> Nov 23, 2016 11:07 AM Neva Seat wrote: Pt had MRI done on [facility] Sunday.   Was checking on results.

## 2016-11-24 ENCOUNTER — Telehealth: Payer: Self-pay | Admitting: Internal Medicine

## 2016-11-24 DIAGNOSIS — M79601 Pain in right arm: Secondary | ICD-10-CM

## 2016-11-24 DIAGNOSIS — M542 Cervicalgia: Secondary | ICD-10-CM

## 2016-11-24 NOTE — Telephone Encounter (Signed)
Copied from [place] [phone]. Topic: Quick Communication - See Telephone Encounter >> Nov 24, 2016 11:34 AM Bea Graff, NT wrote: CRM for notification. See Telephone encounter for: Patient is wanting a referral to a surgeon for her neck. She would like to possibly see Dr. Mannie Stabile at [facility]. She first would like to know what Dr. Nicki Reaper thinks of this physician and is she would recommend him? Please give pt a call to discuss.   11/24/16.

## 2016-11-24 NOTE — Telephone Encounter (Signed)
Please advise 

## 2016-11-24 NOTE — Telephone Encounter (Signed)
See result note for results of MRI and need for referral.

## 2016-11-24 NOTE — Telephone Encounter (Signed)
Pt verified she would like Dr. Saintclair Halsted for referral.

## 2016-11-24 NOTE — Telephone Encounter (Signed)
Documented patient decision in result note.

## 2016-11-24 NOTE — Telephone Encounter (Signed)
Order placed for referral to neurosurgery.  

## 2016-11-24 NOTE — Telephone Encounter (Signed)
The original note I received was that she wanted to see a physician at  [facility].  (see result note).  I am ok if she wants to see Dr Saintclair Halsted.  We also use Dr Deirdre Peer and Dr Arnoldo Morale in [place] and Dr Aris Lot in [place].  Just let me know.

## 2016-11-26 NOTE — Telephone Encounter (Signed)
Pt said she missed a call from the office. She said please give her a call back

## 2016-11-27 ENCOUNTER — Other Ambulatory Visit: Payer: Self-pay | Admitting: Internal Medicine

## 2016-12-04 ENCOUNTER — Telehealth: Payer: Self-pay | Admitting: Internal Medicine

## 2016-12-04 ENCOUNTER — Other Ambulatory Visit: Payer: Self-pay | Admitting: Student

## 2016-12-04 DIAGNOSIS — M5412 Radiculopathy, cervical region: Secondary | ICD-10-CM

## 2016-12-04 MED ORDER — MECLIZINE HCL 25 MG PO TABS: 25.0000 mg | ORAL_TABLET | Freq: Two times a day (BID) | ORAL | 0 refills | Status: DC

## 2016-12-04 NOTE — Telephone Encounter (Signed)
Meclizine should be otc, but if not, then meclizine 25mg  bid prn #20 with no refills.  Can send note to [facility] to see if can get in earlier with ENT.

## 2016-12-04 NOTE — Telephone Encounter (Signed)
Called patient she was given all information let her know that script has been sent and that I will send to see if we can get app

## 2016-12-04 NOTE — Telephone Encounter (Signed)
Copied from [place] [id number]. Topic: Inquiry >> Dec 04, 2016 11:41 AM Malena Catholic I, NT wrote: Reason for CRM: pt call ask to talk Doctor Nicki Reaper Nurse she need something for Vertigo a soon it possible Thanks

## 2016-12-04 NOTE — Telephone Encounter (Signed)
Please advise 

## 2016-12-04 NOTE — Telephone Encounter (Signed)
Patient called states that we referred her to ENT and she has been seen in the past by them. She was given Meclizine 25mg  by walk in before she was seen by ENT and did not need more when she seen them. ENT has never prescribed that medication for her. She can not get in to see them ENT until second week in December. She has made app with them and balance coach but she wanted to know if another ENT could see her sooner. She also wanted to know if you would call in refill on Meclizine. Informed her that she can get otc but she states that she has tried and the otc does not help her at all.

## 2016-12-07 NOTE — Telephone Encounter (Signed)
Per Dr. Nadara Eaton needs to see PT since this is an ongoing issue and then they can get her in to see ENT if she needs to be seen by them. Contacted pt and let her know. She is scheduled for PT on 11/20 @ 9:45 am at [facility] (@ [facility])

## 2016-12-15 ENCOUNTER — Ambulatory Visit
Admission: RE | Admit: 2016-12-15 | Discharge: 2016-12-15 | Disposition: A | Discharge status: Home / Self Care | Payer: Medicare PPO | Source: Ambulatory Visit | Attending: Student | Admitting: Student

## 2016-12-15 DIAGNOSIS — M5412 Radiculopathy, cervical region: Secondary | ICD-10-CM

## 2016-12-15 MED ORDER — IOPAMIDOL (ISOVUE-M 300) INJECTION 61%
1.0000 mL | Freq: Once | INTRAMUSCULAR | Status: AC | PRN
  Administered 2016-12-15: 1 mL via INTRA_ARTICULAR

## 2016-12-15 MED ORDER — DEXAMETHASONE SODIUM PHOSPHATE 4 MG/ML IJ SOLN
5.0000 mg | Freq: Once | INTRAMUSCULAR | Status: AC
  Administered 2016-12-15: 5.2 mg via INTRA_ARTICULAR

## 2016-12-15 NOTE — Discharge Instructions (Signed)

## 2016-12-16 ENCOUNTER — Other Ambulatory Visit: Payer: Self-pay | Admitting: Internal Medicine

## 2016-12-18 NOTE — Telephone Encounter (Signed)
Last OV 11/20/16 last fill 08/20/16 for 30 no refills.

## 2016-12-18 NOTE — Telephone Encounter (Signed)
rx ok'd for clonazepam #30 with one refill.   

## 2016-12-18 NOTE — Telephone Encounter (Unsigned)
Copied from [place] [phone]. Topic: Quick Communication - See Telephone Encounter >> Dec 18, 2016 10:01 AM Aurelio Brash B wrote: CRM for notification. See Telephone encounter for:  Refill klonopin [facility] 12/18/16.

## 2016-12-21 ENCOUNTER — Telehealth: Payer: Self-pay

## 2016-12-21 NOTE — Telephone Encounter (Signed)
See other phone note 12/16/2016

## 2016-12-21 NOTE — Telephone Encounter (Signed)
Copied from [place] [phone]. Topic: Quick Communication - See Telephone Encounter >> Dec 18, 2016 10:01 AM Aurelio Brash B wrote: CRM for notification. See Telephone encounter for:  Refill klonopin [place] 12/18/16. >> Dec 18, 2016 12:44 PM Carolyn Stare wrote:    Pt is following up on her refill pt is out of the med

## 2017-01-01 ENCOUNTER — Other Ambulatory Visit: Payer: Self-pay | Admitting: Student

## 2017-01-01 DIAGNOSIS — M5412 Radiculopathy, cervical region: Secondary | ICD-10-CM

## 2017-01-03 ENCOUNTER — Other Ambulatory Visit: Payer: Self-pay | Admitting: Internal Medicine

## 2017-01-08 ENCOUNTER — Other Ambulatory Visit (INDEPENDENT_AMBULATORY_CARE_PROVIDER_SITE_OTHER): Payer: Medicare PPO

## 2017-01-08 DIAGNOSIS — I10 Essential (primary) hypertension: Secondary | ICD-10-CM

## 2017-01-08 DIAGNOSIS — E7800 Pure hypercholesterolemia, unspecified: Secondary | ICD-10-CM

## 2017-01-08 DIAGNOSIS — R739 Hyperglycemia, unspecified: Secondary | ICD-10-CM

## 2017-01-08 LAB — LIPID PANEL
Cholesterol: 172 mg/dL (ref 0–200)
HDL: 61 mg/dL (ref >39.00)
LDL Cholesterol: 90 mg/dL (ref 0–99)
NonHDL: 110.57
TRIGLYCERIDES: 101 mg/dL (ref 0.0–149.0)
Total CHOL/HDL Ratio: 3
VLDL: 20.2 mg/dL (ref 0.0–40.0)

## 2017-01-08 LAB — BASIC METABOLIC PANEL
BUN: 10 mg/dL (ref 6–23)
CALCIUM: 9.5 mg/dL (ref 8.4–10.5)
CO2: 35 meq/L — AB (ref 19–32)
Chloride: 100 mEq/L (ref 96–112)
Creatinine, Ser: 0.63 mg/dL (ref 0.40–1.20)
GFR: 99.31 mL/min (ref >60.00)
Glucose, Bld: 97 mg/dL (ref 70–99)
POTASSIUM: 3.3 meq/L — AB (ref 3.5–5.1)
SODIUM: 139 meq/L (ref 135–145)

## 2017-01-08 LAB — HEPATIC FUNCTION PANEL
ALBUMIN: 4.4 g/dL (ref 3.5–5.2)
ALK PHOS: 45 U/L (ref 39–117)
ALT: 11 U/L (ref 0–35)
AST: 19 U/L (ref 0–37)
Bilirubin, Direct: 0.1 mg/dL (ref 0.0–0.3)
TOTAL PROTEIN: 6.8 g/dL (ref 6.0–8.3)
Total Bilirubin: 0.7 mg/dL (ref 0.2–1.2)

## 2017-01-08 LAB — HEMOGLOBIN A1C: Hgb A1c MFr Bld: 5.7 % (ref 4.6–6.5)

## 2017-01-11 ENCOUNTER — Other Ambulatory Visit: Payer: Self-pay | Admitting: Internal Medicine

## 2017-01-11 DIAGNOSIS — E876 Hypokalemia: Secondary | ICD-10-CM

## 2017-01-11 NOTE — Progress Notes (Signed)
Order placed for f/u potassium.  

## 2017-01-18 ENCOUNTER — Other Ambulatory Visit: Payer: Medicare PPO

## 2017-01-21 ENCOUNTER — Other Ambulatory Visit: Payer: Self-pay | Admitting: Internal Medicine

## 2017-01-22 ENCOUNTER — Other Ambulatory Visit (INDEPENDENT_AMBULATORY_CARE_PROVIDER_SITE_OTHER): Payer: Medicare PPO

## 2017-01-22 ENCOUNTER — Encounter (INDEPENDENT_AMBULATORY_CARE_PROVIDER_SITE_OTHER): Payer: Self-pay

## 2017-01-22 DIAGNOSIS — E876 Hypokalemia: Secondary | ICD-10-CM

## 2017-01-22 LAB — POTASSIUM: POTASSIUM: 3.5 meq/L (ref 3.5–5.1)

## 2017-02-12 ENCOUNTER — Encounter: Payer: Self-pay | Admitting: Internal Medicine

## 2017-02-12 ENCOUNTER — Ambulatory Visit (INDEPENDENT_AMBULATORY_CARE_PROVIDER_SITE_OTHER): Payer: Medicare PPO | Admitting: Internal Medicine

## 2017-02-12 ENCOUNTER — Telehealth: Payer: Self-pay | Admitting: Internal Medicine

## 2017-02-12 DIAGNOSIS — J4520 Mild intermittent asthma, uncomplicated: Secondary | ICD-10-CM | POA: Diagnosis not present

## 2017-02-12 DIAGNOSIS — J069 Acute upper respiratory infection, unspecified: Secondary | ICD-10-CM | POA: Diagnosis not present

## 2017-02-12 MED ORDER — PREDNISONE 10 MG PO TABS: ORAL_TABLET | ORAL | 0 refills | Status: DC

## 2017-02-12 MED ORDER — DOXYCYCLINE HYCLATE 100 MG PO TABS: 100.0000 mg | ORAL_TABLET | Freq: Two times a day (BID) | ORAL | 0 refills | Status: DC

## 2017-02-12 NOTE — Progress Notes (Signed)
Pre visit review using our clinic review tool, if applicable. No additional management support is needed unless otherwise documented below in the visit note. 

## 2017-02-12 NOTE — Telephone Encounter (Signed)
Patient has been notified and she stated that she will be here @ 1215.

## 2017-02-12 NOTE — Telephone Encounter (Signed)
Please triage

## 2017-02-12 NOTE — Telephone Encounter (Signed)
Please schedule

## 2017-02-12 NOTE — Telephone Encounter (Signed)
Please advise   Copied from [place] [id number]. Topic: General - Other >> Feb 12, 2017  7:58 AM Carolyn Stare wrote:  Pt call to say Dr Nicki Reaper tells her to come when she starts having cold or flu like symptons. Dr Nicki Reaper does not having any appts today but pt said  she told her she would work her in. Pt req call back   [phone]

## 2017-02-12 NOTE — Telephone Encounter (Signed)
See if she can be here at 12:15.  Work in appt.

## 2017-02-12 NOTE — Progress Notes (Signed)
[name], female   DOB: July 15, 1945, 72 y.o.   MRN: [id number]   Subjective:    [name], female    DOB: 10-11-1945, 72 y.o.   MRN: [id number]  HPI  Patient here as a work in with concerns regarding increased cough and congestion.  States symptoms started a few days ago.  Reports increased cough.  Cough is productive of colored mucus.  Increased nasal stuffiness.  Minimal drainage.  No fever.  No sore throat.  Using her neb.  Also taking robitussin.  Eating.  No vomiting.  No diarrhea.     Past Medical History:  Diagnosis Date  . Arthritis   . Asthma   . Bronchitis   . Chronic headaches   . COPD (chronic obstructive pulmonary disease) ([place])   . Depression   . Gastritis   . GERD (gastroesophageal reflux disease)   . Hypertension   . Scoliosis   . Ulcer    Past Surgical History:  Procedure Laterality Date  . ABDOMINAL HYSTERECTOMY    . CHOLECYSTECTOMY  1996  . DILATION AND CURETTAGE OF UTERUS  1971  . [place]  . PARTIAL HYSTERECTOMY  1981   prolapsed uterus  . TUBAL LIGATION  1978   Family History  Problem Relation Age of Onset  . Stroke Mother   . Cancer Mother        Brain   . CVA Maternal Grandmother   . Cancer Father   . Breast cancer Neg Hx   . Colon cancer Neg Hx    Social History   Socioeconomic History  . Marital status: Married    Spouse name: None  . Number of children: 2  . Years of education: None  . Highest education level: None  Social Needs  . Financial resource strain: None  . Food insecurity - worry: None  . Food insecurity - inability: None  . Transportation needs - medical: None  . Transportation needs - non-medical: None  Occupational History  . Occupation: retired Pharmacist, hospital  Tobacco Use  . Smoking status: Never Smoker  . Smokeless tobacco: Never Used  Substance and Sexual Activity  . Alcohol use: No    Alcohol/week: 0.0 oz  . Drug use: No  . Sexual activity: None  Other Topics  Concern  . None  Social History Narrative  . None    Outpatient Encounter Medications as of 02/12/2017  Medication Sig  . albuterol (PROVENTIL) (2.5 MG/3ML) 0.083% nebulizer solution Inhale into the lungs.  . citalopram (CELEXA) 40 MG tablet Take 1 tablet (40 mg total) by mouth daily.  . clonazePAM (KLONOPIN) 0.5 MG tablet TAKE ONE-HALF TO ONE TABLET AS NEEDED.  Marland Kitchen doxycycline (VIBRA-TABS) 100 MG tablet Take 1 tablet (100 mg total) by mouth 2 (two) times daily.  . fluticasone (FLONASE) 50 MCG/ACT nasal spray Place 2 sprays into the nose daily as needed.  . hydrochlorothiazide (HYDRODIURIL) 25 MG tablet Take 1 tablet (25 mg total) by mouth daily.  Marland Kitchen ipratropium (ATROVENT) 0.02 % nebulizer solution Take 2.5 mLs (0.5 mg total) by nebulization 4 (four) times daily.  Marland Kitchen levalbuterol (XOPENEX HFA) 45 MCG/ACT inhaler Inhale 1-2 puffs into the lungs every 6 (six) hours as needed.  . meclizine (ANTIVERT) 25 MG tablet Take 1 tablet (25 mg total) 2 (two) times daily by mouth.  . pantoprazole (PROTONIX) 40 MG tablet Take 1 tablet (40 mg total) by mouth daily.  . pravastatin (PRAVACHOL) 10  MG tablet Take 1 tablet (10 mg total) by mouth daily.  . predniSONE (DELTASONE) 10 MG tablet Take 6 tablets x 1 day and then decrease by 1/2 tablet per day until down to zero mg.  . Probiotic Product (PROBIOTIC DAILY PO) Take by mouth.  . ranitidine (ZANTAC) 150 MG tablet Take 150 mg by mouth at bedtime.  Marland Kitchen tiotropium (SPIRIVA HANDIHALER) 18 MCG inhalation capsule Place 1 capsule (18 mcg total) into inhaler and inhale daily.  . [DISCONTINUED] doxycycline (VIBRA-TABS) 100 MG tablet Take 1 tablet (100 mg total) by mouth 2 (two) times daily.  . [DISCONTINUED] predniSONE (DELTASONE) 10 MG tablet Take 6 tablets x 1 day and then decrease by 1/2 tablet per day until down to zero mg.   No facility-administered encounter medications on file as of 02/12/2017.     Review of Systems  Constitutional: Negative for appetite change  and fever.  HENT: Positive for congestion, postnasal drip and sinus pressure.   Respiratory: Positive for cough. Negative for chest tightness and shortness of breath.   Cardiovascular: Negative for chest pain and leg swelling.  Gastrointestinal: Negative for abdominal pain, diarrhea, nausea and vomiting.  Skin: Negative for color change and rash.  Neurological: Negative for dizziness, light-headedness and headaches.       Objective:     Pulse ox recheck:  98% Physical Exam  Constitutional: She appears well-developed and well-nourished. No distress.  HENT:  Mouth/Throat: Oropharynx is clear and moist.  Nares:  Slightly erythematous turbinates.  No significant tenderness to palpation over the sinuses.  Neck: Neck supple.  Cardiovascular: Normal rate and regular rhythm.  Pulmonary/Chest: Breath sounds normal. No respiratory distress. She has no wheezes.  Increased cough with expiration.    Lymphadenopathy:    She has no cervical adenopathy.    BP 112/68   Pulse 79   Temp 98.5 F (36.9 C) (Oral)   Ht 5\' 1"  (1.549 m)   Wt 159 lb 12.8 oz (72.5 kg)   SpO2 91%   BMI 30.19 kg/m  Wt Readings from Last 3 Encounters:  02/12/17 159 lb 12.8 oz (72.5 kg)  11/20/16 162 lb 6.4 oz (73.7 kg)  11/12/16 163 lb 3.2 oz (74 kg)     Lab Results  Component Value Date   WBC 7.4 09/01/2016   HGB 14.5 09/01/2016   HCT 43.6 09/01/2016   PLT 223.0 09/01/2016   GLUCOSE 97 01/08/2017   CHOL 172 01/08/2017   TRIG 101.0 01/08/2017   HDL 61.00 01/08/2017   LDLDIRECT 148.4 11/03/2012   LDLCALC 90 01/08/2017   ALT 11 01/08/2017   AST 19 01/08/2017   NA 139 01/08/2017   K 3.5 01/22/2017   CL 100 01/08/2017   CREATININE 0.63 01/08/2017   BUN 10 01/08/2017   CO2 35 (H) 01/08/2017   TSH 1.40 09/01/2016   HGBA1C 5.7 01/08/2017    Dg Facet Jt Inj C/t Single Level Right W/fl/ct  Result Date: 12/15/2016 CLINICAL DATA:  Spondylosis without myelopathy. Spinal curvature and degenerative changes.  EXAM: Right C4-5 CERVICAL FACET INJECTION A posterior approach was taken to the facet on the right at C4-5 using a 3.5 inch 25 gauge spinal needle. Intra-articular/periarticular positioning was confirmed by injecting a drop of Isovue-M 300. No vascular opacification is seen. 0.5 ml of dexamethasone mixed with a few drops of 1% Lidocaine were instilled into and around the joint. The procedure was well-tolerated. The patient was discharged twenty minutes following the injection in good condition. FLUOROSCOPY TIME:  1  minutes 14 seconds. 58.95 micro gray meter squared IMPRESSION: Technically successful right C4-5 intra-articular and periarticular facet injection. Electronically Signed   By: Nelson Chimes M.D.   On: 12/15/2016 09:55       Assessment & Plan:   Problem List Items Addressed This Visit    Asthma    Treat current respiratory infection.  Follow.       Relevant Medications   predniSONE (DELTASONE) 10 MG tablet   URI (upper respiratory infection)    Increased cough and congestion as outlined.  Continue inhaler and neb treatments.  Doxycycline as directed.  Prednisone taper.  Follow.            [name], MD

## 2017-02-12 NOTE — Telephone Encounter (Signed)
Patient stated that she has a hx of COPD and Scoliosis so it is very hard to see her lungs on Xray.  She was informed that whenever she has any kind of wet cough she should be evaluated.  Patient is having Sx of sinus congestion and a barking cough.  The coughing episodes have been giving patient a head aches.  Sx have been going on for three days.  Patient has been taking Rubitussin and using her nebulizer to treat sx.  When patient woke up this morning all the symptoms have become worse.  Pleas advise.

## 2017-02-12 NOTE — Telephone Encounter (Signed)
Patient was scheduled

## 2017-02-14 ENCOUNTER — Encounter: Payer: Self-pay | Admitting: Internal Medicine

## 2017-02-14 NOTE — Assessment & Plan Note (Signed)
Treat current respiratory infection.  Follow.

## 2017-02-14 NOTE — Assessment & Plan Note (Signed)
Increased cough and congestion as outlined.  Continue inhaler and neb treatments.  Doxycycline as directed.  Prednisone taper.  Follow.

## 2017-02-15 ENCOUNTER — Other Ambulatory Visit: Payer: Medicare PPO

## 2017-02-15 ENCOUNTER — Ambulatory Visit: Payer: Self-pay | Admitting: *Deleted

## 2017-02-15 NOTE — Telephone Encounter (Signed)
Please advise 

## 2017-02-15 NOTE — Telephone Encounter (Signed)
If she is having pain despite prednisone and abx, I would recommend ENT evaluation.  I do not mind seeing her, but I think ENT needs to see her.  If agreeable, let me know and I will place the order for the referrral.

## 2017-02-15 NOTE — Telephone Encounter (Signed)
Called pt because she is having a sore spot on her left neck and left earache; see telephone message dated 02/15/17,   "Patient called and wanted to know if doxycycline (VIBRA-TABS) 100 MG tablet could help with pain that she is experiencing in her left ear and neck. Patient said that the pain is not severe"     She also says that her cough has gotten better since her appointment on 02/12/17 and she has taken ibuprofen daily (200mg  in morning and 200 mg in evening); the ear pain is new starting on Saturday 02/13/17; spoke with Fransisco Beau and was notified that pt needs to come into office for evaluation; nurse triage initiated and recommendation made for pt see physician within the next 24 hours;  because of her medical history pt will only see Dr Nicki Reaper however she has no availability per triage protocol; will route to [facility] for assistance with scheduling; pt can be contacted at [phone].   Reason for Disposition . Earache  (Exceptions: brief ear pain of < 60 minutes duration, earache occurring during air travel  Answer Assessment - Initial Assessment Questions 1. LOCATION: "Which ear is involved?"     Left  2. ONSET: "When did the ear start hurting"      02/13/17 3. SEVERITY: "How bad is the pain?"  (Scale 1-10; mild, moderate or severe)   - MILD (1-3): doesn't interfere with normal activities    - MODERATE (4-7): interferes with normal activities or awakens from sleep    - SEVERE (8-10): excruciating pain, unable to do any normal activities      Mild rated 2 out of 10 4. URI SYMPTOMS: " Do you have a runny nose or cough?"     cough 5. FEVER: "Do you have a fever?" If so, ask: "What is your temperature, how was it measured, and when did it start?"     no 6. CAUSE: "Have you been swimming recently?", "How often do you use Q-TIPS?", "Have you had any recent air travel or scuba diving?"     unsure 7. OTHER SYMPTOMS: "Do you have any other symptoms?" (e.g., headache, stiff neck, dizziness,  vomiting, runny nose, decreased hearing)     Seen in office 02/12/17 diagnosed with URI  8. PREGNANCY: "Is there any chance you are pregnant?" "When was your last menstrual period?"     no  Protocols used: EARACHE-A-AH

## 2017-02-15 NOTE — Telephone Encounter (Signed)
Patient would like to wait and give answer after the ABX is done.

## 2017-02-16 ENCOUNTER — Other Ambulatory Visit: Payer: Self-pay | Admitting: Internal Medicine

## 2017-02-16 NOTE — Telephone Encounter (Signed)
Last OV 02/12/2017 Next OV 03/24/2017 Last refill 12/18/2016 with 1 refill

## 2017-02-17 ENCOUNTER — Telehealth: Payer: Self-pay

## 2017-02-17 DIAGNOSIS — M542 Cervicalgia: Secondary | ICD-10-CM

## 2017-02-17 DIAGNOSIS — H9209 Otalgia, unspecified ear: Secondary | ICD-10-CM

## 2017-02-17 NOTE — Telephone Encounter (Signed)
Patient is aware that referral has been placed and she says that she has been having issues with vertigo spells again and her ears have been hurting a little the last few days.

## 2017-02-17 NOTE — Telephone Encounter (Signed)
ent referral placed. Please let pt know and confirm doing ok.

## 2017-02-17 NOTE — Telephone Encounter (Signed)
Please advise 

## 2017-02-17 NOTE — Telephone Encounter (Signed)
ok'd rx for clonazepam #30 with one refill.   

## 2017-02-17 NOTE — Telephone Encounter (Signed)
Copied from [place]. Topic: Referral - Request >> Feb 17, 2017  8:46 AM Ivar Drape wrote: Reason for CRM:    Patient would like a referral to see an ENT - Dr. Beverly Gust

## 2017-02-17 NOTE — Telephone Encounter (Signed)
Faxed to [employer]

## 2017-02-18 NOTE — Telephone Encounter (Signed)
Called al. Jennifer [name]. Asked to change referral to STAT per Dr. Nicki Reaper. I faxed them additional notes to support reason the referral was changed to STAT. They will call the patient to schedule.

## 2017-02-18 NOTE — Telephone Encounter (Signed)
Noted.  ENT referral placed and I spoke to [facility] about this.  Let me know if she needs anything prior.

## 2017-02-18 NOTE — Telephone Encounter (Signed)
This is the patient I was discussing with you. Sending to you so you can document.

## 2017-02-18 NOTE — Telephone Encounter (Signed)
Pt states Dr. Tami Ribas office is not able to get her in until next Wed. And she is not able to wait until then.

## 2017-02-22 ENCOUNTER — Other Ambulatory Visit: Payer: Self-pay | Admitting: Unknown Physician Specialty

## 2017-02-22 DIAGNOSIS — M542 Cervicalgia: Secondary | ICD-10-CM

## 2017-02-22 DIAGNOSIS — R42 Dizziness and giddiness: Secondary | ICD-10-CM

## 2017-03-01 ENCOUNTER — Ambulatory Visit
Admission: RE | Admit: 2017-03-01 | Discharge: 2017-03-01 | Disposition: A | Discharge status: Home / Self Care | Payer: Medicare PPO | Source: Ambulatory Visit | Attending: Unknown Physician Specialty | Admitting: Unknown Physician Specialty

## 2017-03-01 DIAGNOSIS — M542 Cervicalgia: Secondary | ICD-10-CM | POA: Insufficient documentation

## 2017-03-01 DIAGNOSIS — R42 Dizziness and giddiness: Secondary | ICD-10-CM

## 2017-03-05 IMAGING — US US EXTREM LOW VENOUS*R*
1 series · 14 of 24 positions shown · non-contrast
Comparison: None

CLINICAL DATA: Pain and swelling, right lower extremity, x1 week.
Redness. Varicose/ spider veins. Recent metatarsal fracture.

EXAM:
RIGHT LOWER EXTREMITY VENOUS DOPPLER ULTRASOUND
TECHNIQUE: Gray-scale sonography with compression, as well as color and duplex
ultrasound, were performed to evaluate the deep venous system from
the level of the common femoral vein through the popliteal and
proximal calf veins.

[Series 1: us extrem low venous*right* · 0.07mm/px · 14 of 45 slices shown]
[im 1/45]
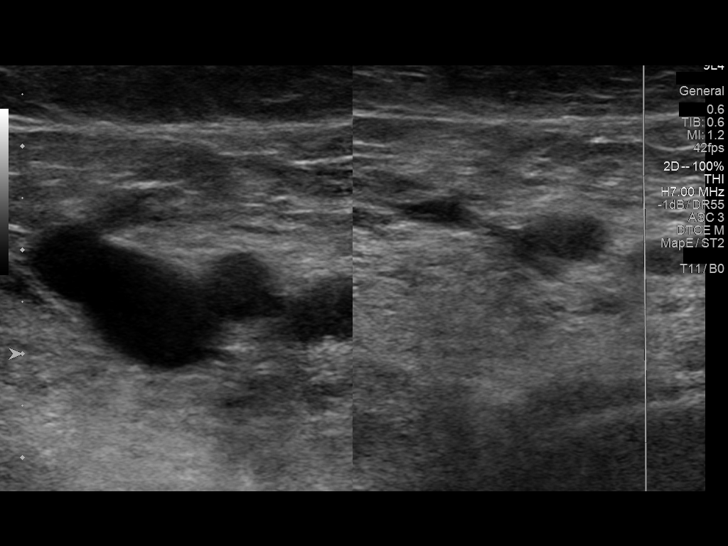
[im 4/45]
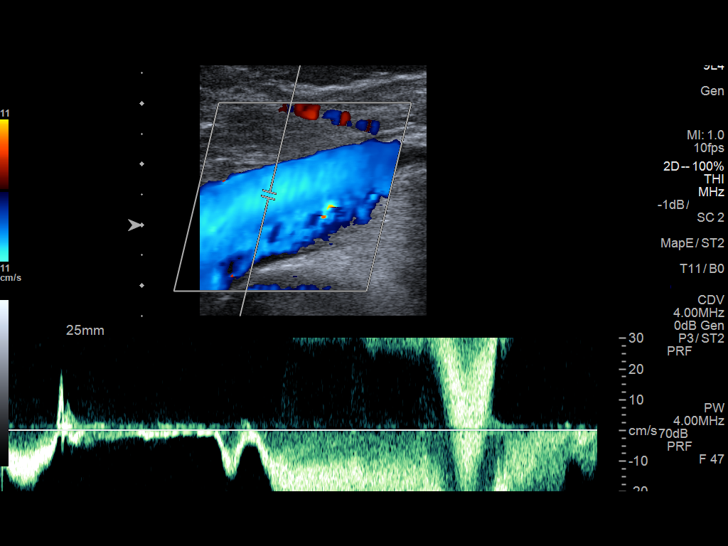
[im 8/45]
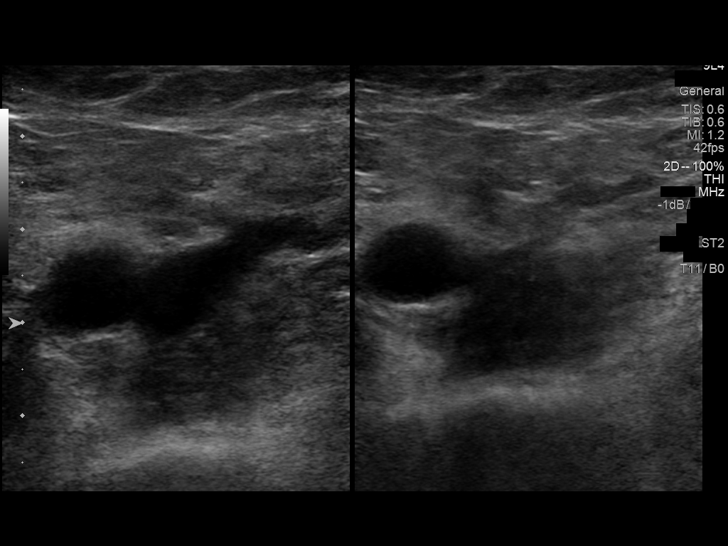
[im 12/45]
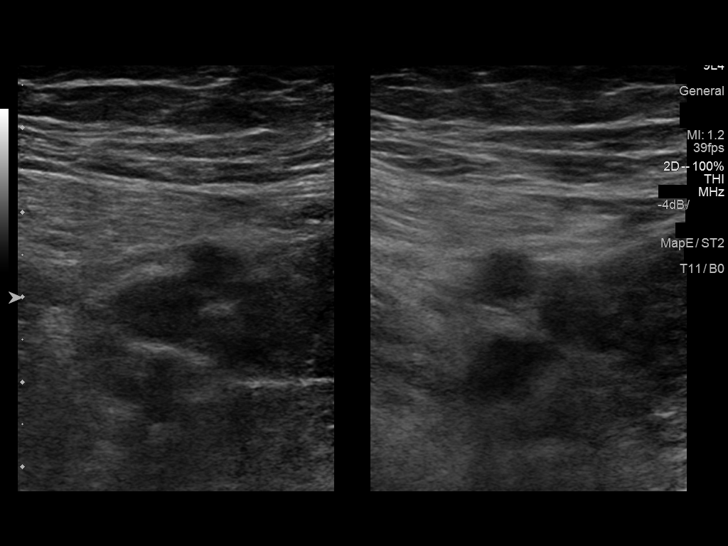
[im 14/45]
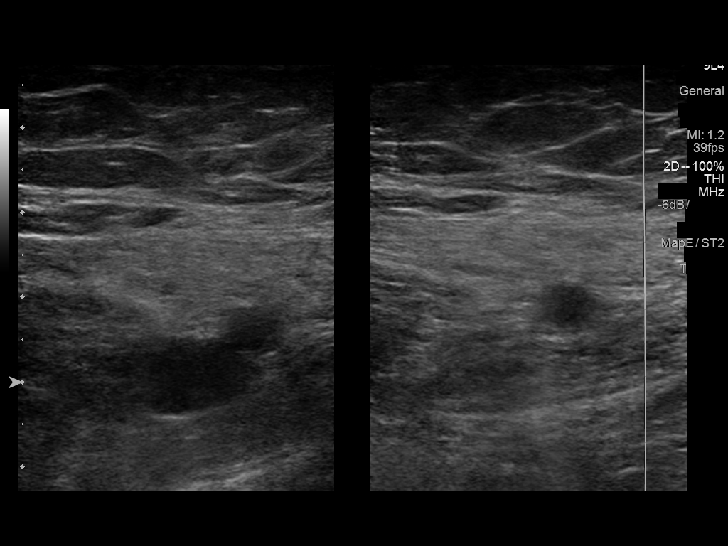
[im 18/45]
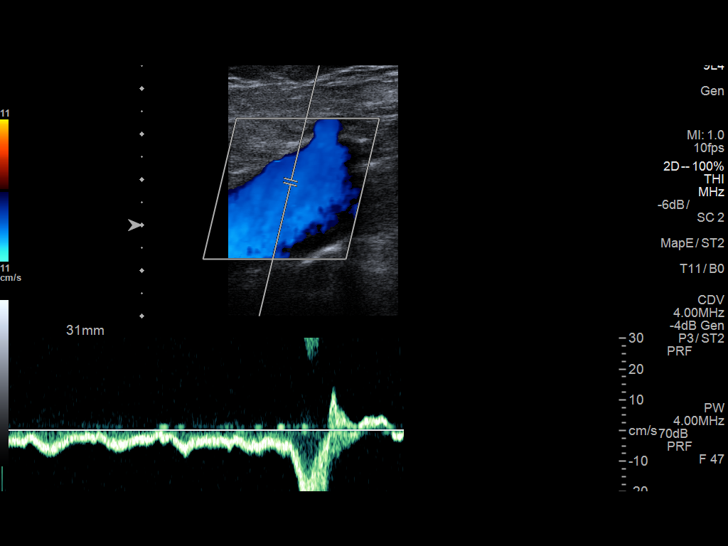
[im 22/45]
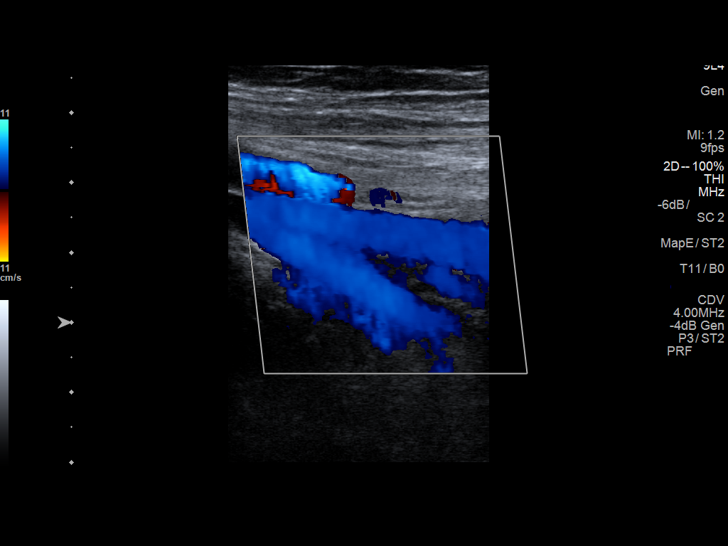
[im 23/45]
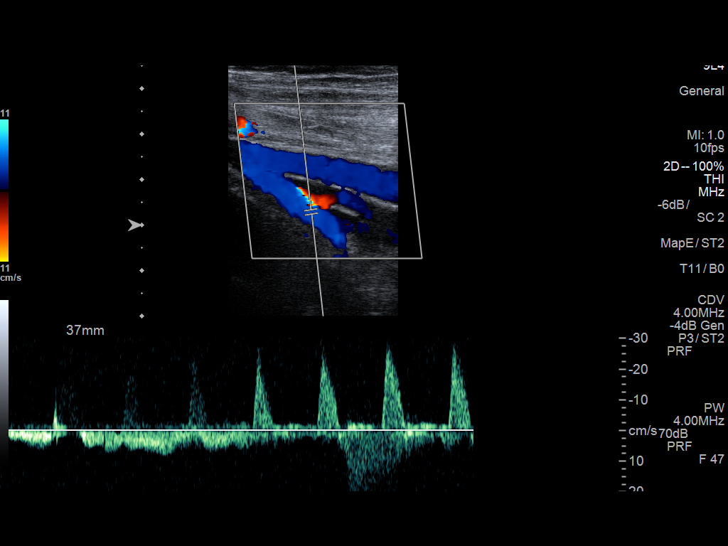
[im 27/45]
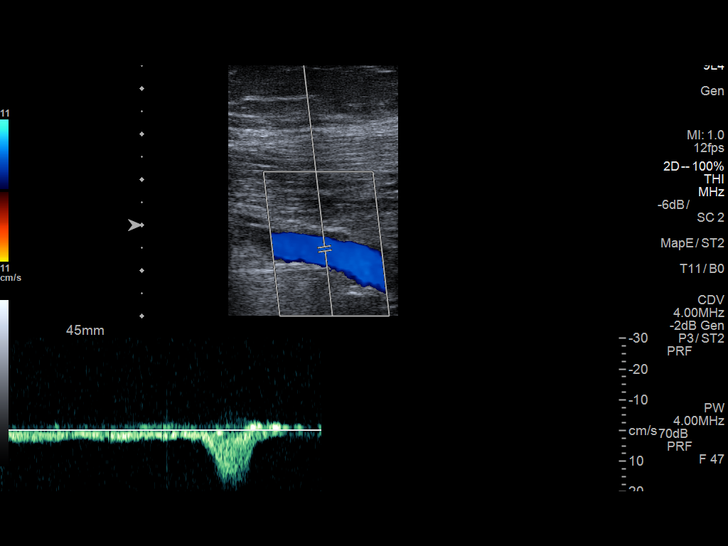
[im 31/45]
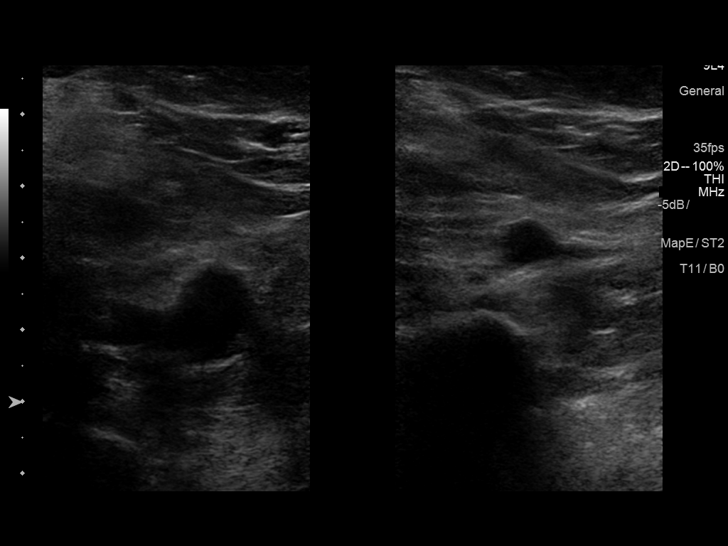
[im 35/45]
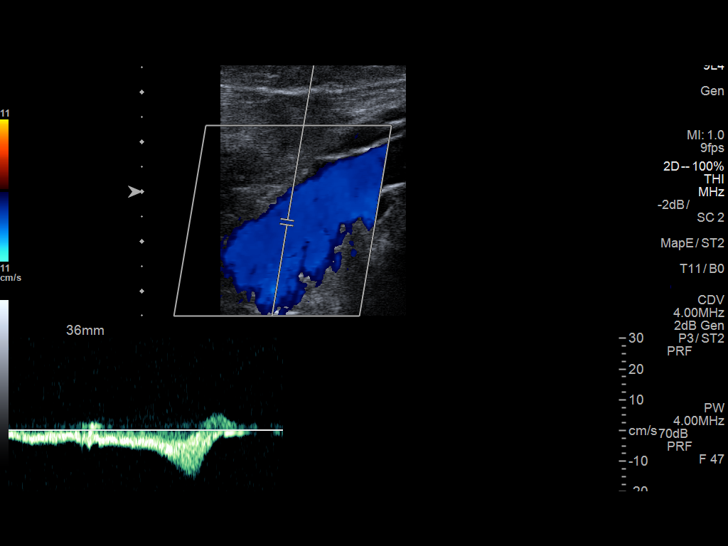
[im 37/45]
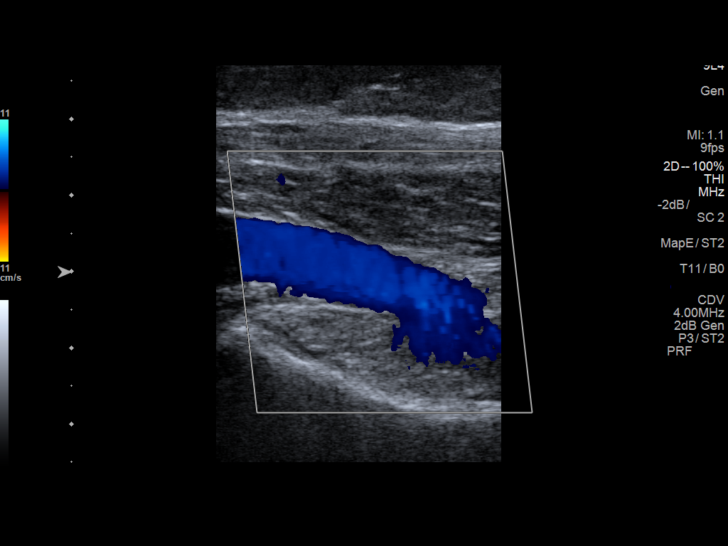
[im 41/45]
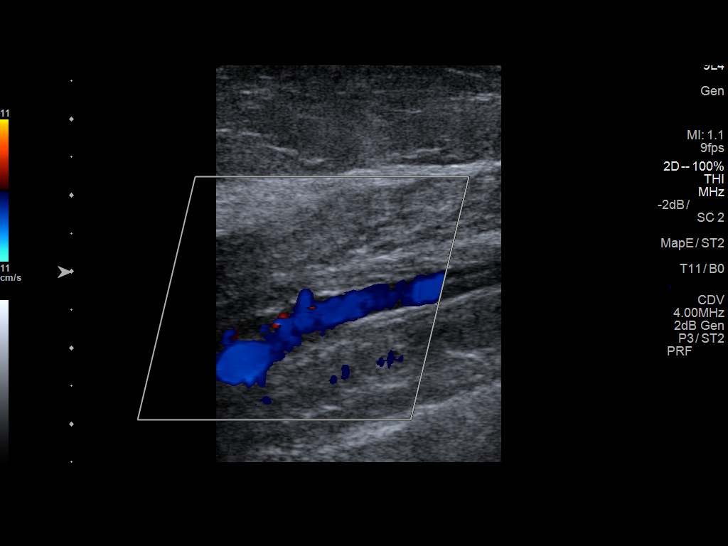
[im 45/45]
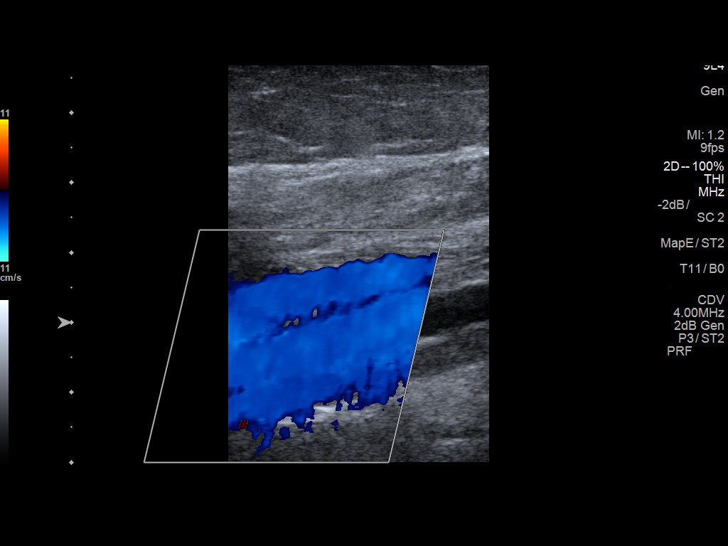

[14 of 24 positions shown; findings below may reference images not displayed]

FINDINGS: Normal compressibility of the common femoral, superficial femoral,
and popliteal veins, as well as the proximal calf veins. No filling
defects to suggest DVT on grayscale or color Doppler imaging.
Doppler waveforms show normal direction of venous flow, normal
respiratory phasicity and response to augmentation. Survey views of
the contralateral common femoral vein are unremarkable.
IMPRESSION: 1. No evidence of lower extremity deep vein thrombosis, RIGHT.

## 2017-03-08 ENCOUNTER — Other Ambulatory Visit: Payer: Self-pay | Admitting: Internal Medicine

## 2017-03-09 ENCOUNTER — Other Ambulatory Visit: Payer: Medicare PPO

## 2017-03-22 ENCOUNTER — Ambulatory Visit
Admission: RE | Admit: 2017-03-22 | Discharge: 2017-03-22 | Disposition: A | Discharge status: Home / Self Care | Payer: Medicare PPO | Source: Ambulatory Visit | Attending: Student | Admitting: Student

## 2017-03-22 DIAGNOSIS — M5412 Radiculopathy, cervical region: Secondary | ICD-10-CM

## 2017-03-22 MED ORDER — DEXAMETHASONE SODIUM PHOSPHATE 4 MG/ML IJ SOLN
6.0000 mg | Freq: Once | INTRAMUSCULAR | Status: AC
  Administered 2017-03-22: 6 mg via INTRA_ARTICULAR

## 2017-03-22 MED ORDER — IOPAMIDOL (ISOVUE-M 300) INJECTION 61%
1.0000 mL | Freq: Once | INTRAMUSCULAR | Status: AC | PRN
  Administered 2017-03-22: 1 mL via INTRA_ARTICULAR

## 2017-03-22 NOTE — Discharge Instructions (Signed)

## 2017-03-23 ENCOUNTER — Other Ambulatory Visit: Payer: Self-pay | Admitting: Internal Medicine

## 2017-03-24 ENCOUNTER — Ambulatory Visit (INDEPENDENT_AMBULATORY_CARE_PROVIDER_SITE_OTHER): Payer: Medicare PPO | Admitting: Internal Medicine

## 2017-03-24 ENCOUNTER — Encounter: Payer: Self-pay | Admitting: Internal Medicine

## 2017-03-24 VITALS — BP 116/76 | HR 70 | Temp 97.6°F | Resp 18 | Wt 161.2 lb

## 2017-03-24 DIAGNOSIS — J984 Other disorders of lung: Secondary | ICD-10-CM

## 2017-03-24 DIAGNOSIS — J4520 Mild intermittent asthma, uncomplicated: Secondary | ICD-10-CM | POA: Diagnosis not present

## 2017-03-24 DIAGNOSIS — H9202 Otalgia, left ear: Secondary | ICD-10-CM

## 2017-03-24 DIAGNOSIS — I10 Essential (primary) hypertension: Secondary | ICD-10-CM | POA: Diagnosis not present

## 2017-03-24 DIAGNOSIS — E7800 Pure hypercholesterolemia, unspecified: Secondary | ICD-10-CM

## 2017-03-24 DIAGNOSIS — K2100 Gastro-esophageal reflux disease with esophagitis, without bleeding: Secondary | ICD-10-CM

## 2017-03-24 DIAGNOSIS — R739 Hyperglycemia, unspecified: Secondary | ICD-10-CM

## 2017-03-24 DIAGNOSIS — L989 Disorder of the skin and subcutaneous tissue, unspecified: Secondary | ICD-10-CM | POA: Diagnosis not present

## 2017-03-24 MED ORDER — RANITIDINE HCL 150 MG PO TABS: 150.0000 mg | ORAL_TABLET | Freq: Two times a day (BID) | ORAL | 2 refills | Status: DC

## 2017-03-24 MED ORDER — MUPIROCIN 2 % EX OINT: TOPICAL_OINTMENT | CUTANEOUS | 0 refills | Status: DC

## 2017-03-24 NOTE — Progress Notes (Signed)
[name], female   DOB: May 22, 1945, 72 y.o.   MRN: [id number]   Subjective:    [name], female    DOB: [birth year], 72 y.o.   MRN: [id number]  HPI  Patient here for a scheduled follow up.  Breathing better.  No increased cough and congestion.  She is still having some intermittent right ear issues.  Some minimal pain.   No change in hearing.  No headache.  Some intermittent light headedness if turns to right.  Was evaluated by ENT.  Had neck/carotid ultrasound.  No significant abnormalities noted. No changes made.  No increased sinus congestion or drainage.  No chest pain.  No sob.  Acid reflux - controlled on zantac.   No abdominal pain.  Bowels moving.  Still with neck pain.  Seeing neurosurgery.  S/p injection.  Lesion under her umbilicus.  No pain.    Past Medical History:  Diagnosis Date  . Arthritis   . Asthma   . Bronchitis   . Chronic headaches   . COPD (chronic obstructive pulmonary disease) ([place])   . Depression   . Gastritis   . GERD (gastroesophageal reflux disease)   . Hypertension   . Scoliosis   . Ulcer    Past Surgical History:  Procedure Laterality Date  . ABDOMINAL HYSTERECTOMY    . CHOLECYSTECTOMY  1996  . DILATION AND CURETTAGE OF UTERUS  1971  . [place]  . PARTIAL HYSTERECTOMY  1981   prolapsed uterus  . TUBAL LIGATION  1978   Family History  Problem Relation Age of Onset  . Stroke Mother   . Cancer Mother        Brain   . CVA Maternal Grandmother   . Cancer Father   . Breast cancer Neg Hx   . Colon cancer Neg Hx    Social History   Socioeconomic History  . Marital status: Married    Spouse name: None  . Number of children: 2  . Years of education: None  . Highest education level: None  Social Needs  . Financial resource strain: None  . Food insecurity - worry: None  . Food insecurity - inability: None  . Transportation needs - medical: None  . Transportation needs - non-medical: None    Occupational History  . Occupation: retired Pharmacist, hospital  Tobacco Use  . Smoking status: Never Smoker  . Smokeless tobacco: Never Used  Substance and Sexual Activity  . Alcohol use: No    Alcohol/week: 0.0 oz  . Drug use: No  . Sexual activity: None  Other Topics Concern  . None  Social History Narrative  . None    Outpatient Encounter Medications as of 03/24/2017  Medication Sig  . albuterol (PROVENTIL) (2.5 MG/3ML) 0.083% nebulizer solution Inhale into the lungs.  . citalopram (CELEXA) 40 MG tablet Take 1 tablet (40 mg total) by mouth daily.  . clonazePAM (KLONOPIN) 0.5 MG tablet TAKE ONE-HALF TO ONE TABLET AS NEEDED.  Marland Kitchen doxycycline (VIBRA-TABS) 100 MG tablet Take 1 tablet (100 mg total) by mouth 2 (two) times daily. (Patient not taking: Reported on 03/24/2017)  . fluticasone (FLONASE) 50 MCG/ACT nasal spray Place 2 sprays into the nose daily as needed.  . hydrochlorothiazide (HYDRODIURIL) 25 MG tablet Take 1 tablet (25 mg total) by mouth daily.  Marland Kitchen ipratropium (ATROVENT) 0.02 % nebulizer solution Take 2.5 mLs (0.5 mg total) by nebulization 4 (four) times daily.  Marland Kitchen levalbuterol (  XOPENEX HFA) 45 MCG/ACT inhaler Inhale 1-2 puffs into the lungs every 6 (six) hours as needed.  . meclizine (ANTIVERT) 25 MG tablet Take 1 tablet (25 mg total) 2 (two) times daily by mouth.  . mupirocin ointment (BACTROBAN) 2 % Apply to affected area bid  . pantoprazole (PROTONIX) 40 MG tablet Take 1 tablet (40 mg total) by mouth daily.  . pravastatin (PRAVACHOL) 10 MG tablet Take 1 tablet (10 mg total) by mouth daily.  . predniSONE (DELTASONE) 10 MG tablet Take 6 tablets x 1 day and then decrease by 1/2 tablet per day until down to zero mg. (Patient not taking: Reported on 03/24/2017)  . Probiotic Product (PROBIOTIC DAILY PO) Take by mouth.  . ranitidine (ZANTAC) 150 MG tablet Take 1 tablet (150 mg total) by mouth 2 (two) times daily.  Marland Kitchen tiotropium (SPIRIVA HANDIHALER) 18 MCG inhalation capsule Place 1 capsule (18  mcg total) into inhaler and inhale daily.  . [DISCONTINUED] ranitidine (ZANTAC) 150 MG tablet Take 150 mg by mouth at bedtime.   No facility-administered encounter medications on file as of 03/24/2017.     Review of Systems  Constitutional: Negative for appetite change and unexpected weight change.  HENT: Negative for congestion and sinus pressure.        Left ear issues as outlined.    Respiratory: Negative for cough, chest tightness and shortness of breath.   Cardiovascular: Negative for chest pain, palpitations and leg swelling.  Gastrointestinal: Negative for abdominal pain, diarrhea, nausea and vomiting.  Genitourinary: Negative for difficulty urinating and dysuria.  Musculoskeletal: Positive for neck pain. Negative for joint swelling and myalgias.  Skin: Negative for color change and rash.  Neurological: Negative for dizziness and headaches.       Occasional light headedness as outlined.    Psychiatric/Behavioral: Negative for agitation and dysphoric mood.       Objective:    Physical Exam  Constitutional: She appears well-nourished. No distress.  HENT:  Nose: Nose normal.  Mouth/Throat: Oropharynx is clear and moist.  Ears:  TMs - no erythema.   Neck: Neck supple. No thyromegaly present.  Cardiovascular: Normal rate and regular rhythm.  Pulmonary/Chest: Breath sounds normal. No respiratory distress. She has no wheezes.  Abdominal: Soft. Bowel sounds are normal. There is no tenderness.  Musculoskeletal: She exhibits no edema or tenderness.  Lymphadenopathy:    She has no cervical adenopathy.  Skin: No rash noted. No erythema.  Skin lesion - under umbilicus.    Psychiatric: She has a normal mood and affect. Her behavior is normal.    BP 116/76 (BP Location: Left Arm, Patient Position: Sitting, Cuff Size: Normal)   Pulse 70   Temp 97.6 F (36.4 C) (Oral)   Resp 18   Wt 161 lb 3.2 oz (73.1 kg)   SpO2 98%   BMI 30.46 kg/m  Wt Readings from Last 3 Encounters:    03/24/17 161 lb 3.2 oz (73.1 kg)  02/12/17 159 lb 12.8 oz (72.5 kg)  11/20/16 162 lb 6.4 oz (73.7 kg)     Lab Results  Component Value Date   WBC 7.4 09/01/2016   HGB 14.5 09/01/2016   HCT 43.6 09/01/2016   PLT 223.0 09/01/2016   GLUCOSE 97 01/08/2017   CHOL 172 01/08/2017   TRIG 101.0 01/08/2017   HDL 61.00 01/08/2017   LDLDIRECT 148.4 11/03/2012   LDLCALC 90 01/08/2017   ALT 11 01/08/2017   AST 19 01/08/2017   NA 139 01/08/2017   K 3.5 01/22/2017  CL 100 01/08/2017   CREATININE 0.63 01/08/2017   BUN 10 01/08/2017   CO2 35 (H) 01/08/2017   TSH 1.40 09/01/2016   HGBA1C 5.7 01/08/2017    Dg Facet Jt Inj L /s Single Level Right W/fl/ct  Result Date: 03/22/2017 CLINICAL DATA:  Chronic spinal curvature and degenerative changes. Improvement from the previous right C4-5 injection but with recurrence of symptoms. EXAM: Right C4-5 CERVICAL FACET INJECTION A posterior approach was taken to the facet on the right at CC4-5 using a 3.5 inch 25 gauge spinal needle. Intra-articular and periarticular positioning was confirmed by injecting a drop of Isovue-M 300. No vascular opacification is seen. 0.6 cc dexamethasone were instilled into and around the joint. The procedure was well-tolerated. The patient was discharged twenty minutes following the injection in good condition. FLUOROSCOPY TIME:  0 minutes 59 seconds. 38.48 micro gray meter squared IMPRESSION: Technically successful right C4-5 facet injection. Electronically Signed   By: Nelson Chimes M.D.   On: 03/22/2017 10:29       Assessment & Plan:   Problem List Items Addressed This Visit    Asthma    Breathing doing better.  Follow.        Chronic restrictive lung disease    Followed by pulmonary.  Breathing stable.        Ear pain, left    Intermittent left ear pain.  No change in hearing.  Saw ENT.  Overall stable.  Discussed further evaluation and w/up.  She wants to monitor.  Will notify me if symptoms change or if she  changes her mind.       Essential hypertension, benign    Blood pressure under good control.  Continue same medication regimen.  Follow pressures.  Follow metabolic panel.        Relevant Orders   Basic metabolic panel   GERD (gastroesophageal reflux disease)    Controlled on current regimen.  Follow.        Relevant Medications   ranitidine (ZANTAC) 150 MG tablet   Hypercholesterolemia    On pravastatin.  Low cholesterol diet and exercise.  Follow lipid panel and liver function tests.        Relevant Orders   Hepatic function panel   Lipid panel   Hyperglycemia    Low carb diet and exercise.  Follow met b and a1c.        Relevant Orders   Hemoglobin A1c    Other Visit Diagnoses    Skin lesion    -  Primary   just under umbilicus.  bactroban.  follow.        [name], MD

## 2017-03-27 ENCOUNTER — Encounter: Payer: Self-pay | Admitting: Internal Medicine

## 2017-03-27 DIAGNOSIS — H9202 Otalgia, left ear: Secondary | ICD-10-CM | POA: Insufficient documentation

## 2017-03-27 NOTE — Assessment & Plan Note (Signed)
On pravastatin.  Low cholesterol diet and exercise.  Follow lipid panel and liver function tests.   

## 2017-03-27 NOTE — Assessment & Plan Note (Signed)
Breathing doing better.  Follow.  

## 2017-03-27 NOTE — Assessment & Plan Note (Signed)
Followed by pulmonary.  Breathing stable.   

## 2017-03-27 NOTE — Assessment & Plan Note (Signed)
Controlled on current regimen.  Follow.  

## 2017-03-27 NOTE — Assessment & Plan Note (Signed)
Low carb diet and exercise.  Follow met b and a1c.   

## 2017-03-27 NOTE — Assessment & Plan Note (Signed)
Intermittent left ear pain.  No change in hearing.  Saw ENT.  Overall stable.  Discussed further evaluation and w/up.  She wants to monitor.  Will notify me if symptoms change or if she changes her mind.

## 2017-03-27 NOTE — Assessment & Plan Note (Signed)
Blood pressure under good control.  Continue same medication regimen.  Follow pressures.  Follow metabolic panel.   

## 2017-04-06 ENCOUNTER — Other Ambulatory Visit: Payer: Self-pay | Admitting: Neurosurgery

## 2017-04-13 ENCOUNTER — Encounter (HOSPITAL_COMMUNITY)
Admission: RE | Admit: 2017-04-13 | Discharge: 2017-04-13 | Disposition: A | Discharge status: Home / Self Care | Payer: Medicare PPO | Source: Ambulatory Visit | Attending: Neurosurgery | Admitting: Neurosurgery

## 2017-04-13 ENCOUNTER — Encounter (HOSPITAL_COMMUNITY): Payer: Self-pay

## 2017-04-13 ENCOUNTER — Other Ambulatory Visit: Payer: Self-pay

## 2017-04-13 DIAGNOSIS — Z01810 Encounter for preprocedural cardiovascular examination: Secondary | ICD-10-CM | POA: Diagnosis present

## 2017-04-13 DIAGNOSIS — Z01812 Encounter for preprocedural laboratory examination: Secondary | ICD-10-CM | POA: Diagnosis present

## 2017-04-13 HISTORY — DX: Other specified postprocedural states: Z98.890

## 2017-04-13 HISTORY — DX: Anxiety disorder, unspecified: F41.9

## 2017-04-13 HISTORY — DX: Dyspnea, unspecified: R06.00

## 2017-04-13 HISTORY — DX: Nausea with vomiting, unspecified: R11.2

## 2017-04-13 LAB — BASIC METABOLIC PANEL
ANION GAP: 9 (ref 5–15)
BUN: 7 mg/dL (ref 6–20)
CHLORIDE: 100 mmol/L — AB (ref 101–111)
CO2: 31 mmol/L (ref 22–32)
Calcium: 9.6 mg/dL (ref 8.9–10.3)
Creatinine, Ser: 0.56 mg/dL (ref 0.44–1.00)
GFR calc non Af Amer: >60 mL/min (ref >60)
Glucose, Bld: 89 mg/dL (ref 65–99)
Potassium: 3.3 mmol/L — ABNORMAL LOW (ref 3.5–5.1)
SODIUM: 140 mmol/L (ref 135–145)

## 2017-04-13 LAB — CBC
HCT: 42.8 % (ref 36.0–46.0)
HEMOGLOBIN: 14.1 g/dL (ref 12.0–15.0)
MCH: 30.9 pg (ref 26.0–34.0)
MCHC: 32.9 g/dL (ref 30.0–36.0)
MCV: 93.9 fL (ref 78.0–100.0)
Platelets: 236 10*3/uL (ref 150–400)
RBC: 4.56 MIL/uL (ref 3.87–5.11)
RDW: 12.6 % (ref 11.5–15.5)
WBC: 11.4 10*3/uL — AB (ref 4.0–10.5)

## 2017-04-13 LAB — SURGICAL PCR SCREEN
MRSA, PCR: NEGATIVE
Staphylococcus aureus: NEGATIVE

## 2017-04-13 NOTE — Pre-Procedure Instructions (Signed)
Jennifer [name]  04/13/2017      [facility] - [place], [place] - [address] [place] [address] Phone: [phone] Fax: [phone]    Your procedure is scheduled on 04-19-2017 Monday   Report to [facility] Admitting at 12:30 PM    Call this number if you have problems the morning of surgery:  [phone]   Remember:  Do not eat food or drink liquids after midnight.   Take these medicines the morning of surgery with A SIP OF WATER   Citalopram(Celexa) Clonazepam(Klonopin)  Flonase nasal spray if needed Atrovent nebulizer if needed Levalbuterol(Xopenex HFN)  If needed Pravastatin(Pravachol) Ranitidine(Zantac) Spiriva Handihaler if needed  STOP TAKING ANY ASPIRIN( UNLESS OTHERWISE INSTRUCTED BY YOUR SURGEON),ANTIINFLAMATORIES (IBUPROFEN,ALEVE,MOTRIN,ADVIL,GOODY'S POWDERS),HERBAL SUPPLEMENTS,FISH OIL,AND VITAMINS 5-7 DAYS PRIOR TO SURGERY   Do not wear jewelry, make-up or nail polish.  Do not wear lotions, powders, or perfumes, or deodorant.  Do not shave 48 hours prior to surgery.  Men may shave face and neck.  Do not bring valuables to the hospital.  [facility] is not responsible for any belongings or valuables.  Contacts, dentures or bridgework may not be worn into surgery.  Leave your suitcase in the car.  After surgery it may be brought to your room.  For patients admitted to the hospital, discharge time will be determined by your treatment team.  Patients discharged the day of surgery will not be allowed to drive home.    Special Instructions: [place] - Preparing for Surgery  Before surgery, you can play an important role.  Because skin is not sterile, your skin needs to be as free of germs as possible.  You can reduce the number of germs on you skin by washing with CHG (chlorahexidine gluconate) soap before surgery.  CHG is an antiseptic cleaner which kills germs and bonds with the skin to continue killing germs even  after washing.  Please DO NOT use if you have an allergy to CHG or antibacterial soaps.  If your skin becomes reddened/irritated stop using the CHG and inform your nurse when you arrive at Short Stay.  Do not shave (including legs and underarms) for at least 48 hours prior to the first CHG shower.  You may shave your face.  Please follow these instructions carefully:   1.  Shower with CHG Soap the night before surgery and the   morning of Surgery.  2.  If you choose to wash your hair, wash your hair first as usual with your normal shampoo.  3.  After you shampoo, rinse your hair and body thoroughly to remove the  Shampoo.  4.  Use CHG as you would any other liquid soap.  You can apply chg directly  to the skin and wash gently with scrungie or a clean washcloth.  5.  Apply the CHG Soap to your body ONLY FROM THE NECK DOWN.   Do not use on open wounds or open sores.  Avoid contact with your eyes,  ears, mouth and genitals (private parts).  Wash genitals (private parts) with your normal soap.  6.  Wash thoroughly, paying special attention to the area where your surgery will be performed.  7.  Thoroughly rinse your body with warm water from the neck down.  8.  DO NOT shower/wash with your normal soap after using and rinsing o  the CHG Soap.  9.  Pat yourself dry with a clean  towel.            10.  Wear clean pajamas.            11.  Place clean sheets on your bed the night of your first shower and do not sleep with pets.  Day of Surgery  Do not apply any lotions/deodorants the morning of surgery.  Please wear clean clothes to the hospital/surgery center.   Please read over the following fact sheets that you were given. MRSA Information and Surgical Site Infection Prevention

## 2017-04-13 NOTE — Progress Notes (Addendum)
Denies any cardiac history or testing.  PCP  [name]  MD   Pt  states that she uses O2 at night due to severe scoliosis of the spine. Without  O2 her O2 stats drop.  Mostly uses inhalers when she has bronchitis.

## 2017-04-14 NOTE — Progress Notes (Signed)
Anesthesia Chart Review:  Pt is a 72 year old female scheduled for C3-4, C4-5, C5-6 ACDF on 04/19/2017 with Kary Kos, MD  - PCP is [name], MD - Pulmonology care at [facility] in [place], [place].  Last office visit 07/15/16 with Jamesetta Orleans, NP    PMH includes:  HTN, asthma, scoliosis, post-op N/V, GERD. Never smoker. 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    EKG 04/13/17: NSR   Carotid duplex 03/01/17:  - Color duplex indicates minimal heterogeneous plaque, with no hemodynamically significant stenosis by duplex criteria in the extracranial cerebrovascular circulation.  Echo 02/23/13:  - Left ventricle: The cavity size was normal. Systolic function was normal. The estimated ejection fraction wasin the range of 50% to 55%. Regional wall motionabnormalities cannot be excluded. Doppler parameters are consistent with abnormal left ventricular relaxation(grade 1 diastolic dysfunction). - Left atrium: The atrium was normal in size. - Right ventricle: Systolic function was normal. - Pulmonary arteries: Systolic pressure was within the normal range. - Impressions: Challenging image quality.  If no changes, I anticipate pt can proceed with surgery as scheduled.   Willeen Cass, FNP-BC [facility]/Anesthesiology Phone: [phone] 04/14/2017 3:45 PM

## 2017-04-19 ENCOUNTER — Ambulatory Visit (HOSPITAL_COMMUNITY)
Admission: RE | Disposition: A | Discharge status: Home / Self Care | Payer: Self-pay | Source: Ambulatory Visit | Attending: Neurosurgery

## 2017-04-19 ENCOUNTER — Inpatient Hospital Stay (HOSPITAL_COMMUNITY): Payer: Medicare PPO | Admitting: Emergency Medicine

## 2017-04-19 ENCOUNTER — Encounter (HOSPITAL_COMMUNITY): Payer: Self-pay | Admitting: *Deleted

## 2017-04-19 ENCOUNTER — Inpatient Hospital Stay (HOSPITAL_COMMUNITY): Payer: Medicare PPO

## 2017-04-19 ENCOUNTER — Observation Stay (HOSPITAL_COMMUNITY)
Admission: RE | Admit: 2017-04-19 | Discharge: 2017-04-20 | Disposition: A | Discharge status: Home / Self Care | Payer: Medicare PPO | Source: Ambulatory Visit | Attending: Neurosurgery | Admitting: Neurosurgery

## 2017-04-19 ENCOUNTER — Inpatient Hospital Stay (HOSPITAL_COMMUNITY): Payer: Medicare PPO | Admitting: Certified Registered"

## 2017-04-19 ENCOUNTER — Other Ambulatory Visit: Payer: Self-pay

## 2017-04-19 DIAGNOSIS — M419 Scoliosis, unspecified: Secondary | ICD-10-CM | POA: Insufficient documentation

## 2017-04-19 DIAGNOSIS — Z79899 Other long term (current) drug therapy: Secondary | ICD-10-CM | POA: Insufficient documentation

## 2017-04-19 DIAGNOSIS — Z885 Allergy status to narcotic agent status: Secondary | ICD-10-CM | POA: Insufficient documentation

## 2017-04-19 DIAGNOSIS — J45909 Unspecified asthma, uncomplicated: Secondary | ICD-10-CM | POA: Insufficient documentation

## 2017-04-19 DIAGNOSIS — Z419 Encounter for procedure for purposes other than remedying health state, unspecified: Secondary | ICD-10-CM

## 2017-04-19 DIAGNOSIS — M4722 Other spondylosis with radiculopathy, cervical region: Principal | ICD-10-CM | POA: Insufficient documentation

## 2017-04-19 DIAGNOSIS — M1990 Unspecified osteoarthritis, unspecified site: Secondary | ICD-10-CM | POA: Diagnosis not present

## 2017-04-19 DIAGNOSIS — M4802 Spinal stenosis, cervical region: Secondary | ICD-10-CM | POA: Insufficient documentation

## 2017-04-19 DIAGNOSIS — K219 Gastro-esophageal reflux disease without esophagitis: Secondary | ICD-10-CM | POA: Diagnosis not present

## 2017-04-19 DIAGNOSIS — I10 Essential (primary) hypertension: Secondary | ICD-10-CM | POA: Diagnosis not present

## 2017-04-19 DIAGNOSIS — M5412 Radiculopathy, cervical region: Secondary | ICD-10-CM | POA: Diagnosis present

## 2017-04-19 DIAGNOSIS — M47812 Spondylosis without myelopathy or radiculopathy, cervical region: Secondary | ICD-10-CM | POA: Diagnosis present

## 2017-04-19 HISTORY — PX: ANTERIOR CERVICAL DECOMP/DISCECTOMY FUSION: SHX1161

## 2017-04-19 SURGERY — ANTERIOR CERVICAL DECOMPRESSION/DISCECTOMY FUSION 3 LEVELS
Anesthesia: General

## 2017-04-19 MED ORDER — FAMOTIDINE 20 MG PO TABS
20.0000 mg | ORAL_TABLET | Freq: Every day | ORAL | Status: DC
  Administered 2017-04-20: 20 mg via ORAL
  Filled 2017-04-19: qty 1

## 2017-04-19 MED ORDER — CHLORHEXIDINE GLUCONATE CLOTH 2 % EX PADS: 6.0000 | MEDICATED_PAD | Freq: Once | CUTANEOUS | Status: DC

## 2017-04-19 MED ORDER — OXYCODONE HCL 5 MG PO TABS
10.0000 mg | ORAL_TABLET | ORAL | Status: DC | PRN
  Administered 2017-04-19 – 2017-04-20 (×3): 10 mg via ORAL
  Filled 2017-04-19 (×3): qty 2

## 2017-04-19 MED ORDER — THROMBIN (RECOMBINANT) 20000 UNITS EX SOLR
CUTANEOUS | Status: DC | PRN
  Administered 2017-04-19: 17:00:00 via TOPICAL

## 2017-04-19 MED ORDER — 0.9 % SODIUM CHLORIDE (POUR BTL) OPTIME
TOPICAL | Status: DC | PRN
  Administered 2017-04-19: 1000 mL

## 2017-04-19 MED ORDER — FENTANYL CITRATE (PF) 100 MCG/2ML IJ SOLN
INTRAMUSCULAR | Status: AC
  Filled 2017-04-19: qty 2

## 2017-04-19 MED ORDER — ONDANSETRON HCL 4 MG/2ML IJ SOLN
INTRAMUSCULAR | Status: DC | PRN
  Administered 2017-04-19: 4 mg via INTRAVENOUS

## 2017-04-19 MED ORDER — PROBIOTIC DAILY PO CAPS: ORAL_CAPSULE | Freq: Every day | ORAL | Status: DC

## 2017-04-19 MED ORDER — MECLIZINE HCL 25 MG PO TABS
25.0000 mg | ORAL_TABLET | Freq: Two times a day (BID) | ORAL | Status: DC | PRN
  Filled 2017-04-19: qty 1

## 2017-04-19 MED ORDER — PHENYLEPHRINE 40 MCG/ML (10ML) SYRINGE FOR IV PUSH (FOR BLOOD PRESSURE SUPPORT)
PREFILLED_SYRINGE | INTRAVENOUS | Status: AC
  Filled 2017-04-19: qty 10

## 2017-04-19 MED ORDER — DEXAMETHASONE SODIUM PHOSPHATE 10 MG/ML IJ SOLN
INTRAMUSCULAR | Status: DC | PRN
  Administered 2017-04-19: 10 mg via INTRAVENOUS

## 2017-04-19 MED ORDER — FENTANYL CITRATE (PF) 250 MCG/5ML IJ SOLN
INTRAMUSCULAR | Status: DC | PRN
  Administered 2017-04-19: 100 ug via INTRAVENOUS
  Administered 2017-04-19: 50 ug via INTRAVENOUS

## 2017-04-19 MED ORDER — LIDOCAINE HCL (CARDIAC) 20 MG/ML IV SOLN
INTRAVENOUS | Status: AC
  Filled 2017-04-19: qty 5

## 2017-04-19 MED ORDER — CITALOPRAM HYDROBROMIDE 40 MG PO TABS
40.0000 mg | ORAL_TABLET | Freq: Every day | ORAL | Status: DC
  Administered 2017-04-19 – 2017-04-20 (×2): 40 mg via ORAL
  Filled 2017-04-19 (×2): qty 1

## 2017-04-19 MED ORDER — FENTANYL CITRATE (PF) 250 MCG/5ML IJ SOLN
INTRAMUSCULAR | Status: AC
Start: 2017-04-19 — End: 2017-04-19
  Filled 2017-04-19: qty 5

## 2017-04-19 MED ORDER — PANTOPRAZOLE SODIUM 40 MG PO TBEC
40.0000 mg | DELAYED_RELEASE_TABLET | Freq: Every day | ORAL | Status: DC
Start: 2017-04-19 — End: 2017-04-20
  Administered 2017-04-19: 40 mg via ORAL
  Filled 2017-04-19: qty 1

## 2017-04-19 MED ORDER — ADULT MULTIVITAMIN W/MINERALS CH
1.0000 | ORAL_TABLET | Freq: Every day | ORAL | Status: DC
  Filled 2017-04-19: qty 1

## 2017-04-19 MED ORDER — ACETAMINOPHEN 650 MG RE SUPP: 650.0000 mg | RECTAL | Status: DC | PRN

## 2017-04-19 MED ORDER — EPHEDRINE SULFATE-NACL 50-0.9 MG/10ML-% IV SOSY
PREFILLED_SYRINGE | INTRAVENOUS | Status: DC | PRN
  Administered 2017-04-19: 5 mg via INTRAVENOUS
  Administered 2017-04-19: 10 mg via INTRAVENOUS
  Administered 2017-04-19: 5 mg via INTRAVENOUS

## 2017-04-19 MED ORDER — RISAQUAD PO CAPS
1.0000 | ORAL_CAPSULE | Freq: Every day | ORAL | Status: DC
  Administered 2017-04-19 – 2017-04-20 (×2): 1 via ORAL
  Filled 2017-04-19 (×2): qty 1

## 2017-04-19 MED ORDER — CYCLOBENZAPRINE HCL 10 MG PO TABS: 10.0000 mg | ORAL_TABLET | Freq: Three times a day (TID) | ORAL | Status: DC | PRN

## 2017-04-19 MED ORDER — ALUM & MAG HYDROXIDE-SIMETH 200-200-20 MG/5ML PO SUSP: 30.0000 mL | Freq: Four times a day (QID) | ORAL | Status: DC | PRN

## 2017-04-19 MED ORDER — PRAVASTATIN SODIUM 40 MG PO TABS
20.0000 mg | ORAL_TABLET | Freq: Every day | ORAL | Status: DC
  Administered 2017-04-20: 20 mg via ORAL
  Filled 2017-04-19 (×2): qty 1

## 2017-04-19 MED ORDER — SUGAMMADEX SODIUM 200 MG/2ML IV SOLN
INTRAVENOUS | Status: DC | PRN
  Administered 2017-04-19: 200 mg via INTRAVENOUS

## 2017-04-19 MED ORDER — IPRATROPIUM BROMIDE 0.02 % IN SOLN
0.5000 mg | Freq: Four times a day (QID) | RESPIRATORY_TRACT | Status: DC | PRN
  Filled 2017-04-19: qty 2.5

## 2017-04-19 MED ORDER — ALBUTEROL SULFATE (2.5 MG/3ML) 0.083% IN NEBU: 3.0000 mL | INHALATION_SOLUTION | Freq: Four times a day (QID) | RESPIRATORY_TRACT | Status: DC | PRN

## 2017-04-19 MED ORDER — FENTANYL CITRATE (PF) 100 MCG/2ML IJ SOLN
25.0000 ug | INTRAMUSCULAR | Status: DC | PRN
  Administered 2017-04-19: 50 ug via INTRAVENOUS

## 2017-04-19 MED ORDER — ROCURONIUM BROMIDE 10 MG/ML (PF) SYRINGE
PREFILLED_SYRINGE | INTRAVENOUS | Status: DC | PRN
  Administered 2017-04-19 (×2): 10 mg via INTRAVENOUS
  Administered 2017-04-19: 60 mg via INTRAVENOUS

## 2017-04-19 MED ORDER — METOCLOPRAMIDE HCL 5 MG/ML IJ SOLN: 10.0000 mg | Freq: Once | INTRAMUSCULAR | Status: DC | PRN

## 2017-04-19 MED ORDER — CEFAZOLIN SODIUM-DEXTROSE 2-4 GM/100ML-% IV SOLN
2.0000 g | Freq: Three times a day (TID) | INTRAVENOUS | Status: AC
  Administered 2017-04-19 – 2017-04-20 (×2): 2 g via INTRAVENOUS
  Filled 2017-04-19: qty 100

## 2017-04-19 MED ORDER — HYDROMORPHONE HCL 1 MG/ML IJ SOLN: 0.5000 mg | INTRAMUSCULAR | Status: DC | PRN

## 2017-04-19 MED ORDER — PANTOPRAZOLE SODIUM 40 MG IV SOLR: 40.0000 mg | Freq: Every day | INTRAVENOUS | Status: DC

## 2017-04-19 MED ORDER — THROMBIN 20000 UNITS EX SOLR
CUTANEOUS | Status: AC
  Filled 2017-04-19: qty 20000

## 2017-04-19 MED ORDER — LIDOCAINE 2% (20 MG/ML) 5 ML SYRINGE
INTRAMUSCULAR | Status: DC | PRN
  Administered 2017-04-19: 50 mg via INTRAVENOUS

## 2017-04-19 MED ORDER — ROCURONIUM BROMIDE 10 MG/ML (PF) SYRINGE
PREFILLED_SYRINGE | INTRAVENOUS | Status: AC
  Filled 2017-04-19: qty 5

## 2017-04-19 MED ORDER — MUPIROCIN 2 % EX OINT: 1.0000 "application " | TOPICAL_OINTMENT | Freq: Every day | CUTANEOUS | Status: DC | PRN

## 2017-04-19 MED ORDER — PHENYLEPHRINE 40 MCG/ML (10ML) SYRINGE FOR IV PUSH (FOR BLOOD PRESSURE SUPPORT)
PREFILLED_SYRINGE | INTRAVENOUS | Status: DC | PRN
  Administered 2017-04-19: 80 ug via INTRAVENOUS
  Administered 2017-04-19 (×3): 40 ug via INTRAVENOUS

## 2017-04-19 MED ORDER — HYDROCHLOROTHIAZIDE 25 MG PO TABS
25.0000 mg | ORAL_TABLET | Freq: Every day | ORAL | Status: DC
  Filled 2017-04-19: qty 1

## 2017-04-19 MED ORDER — PROPOFOL 10 MG/ML IV BOLUS
INTRAVENOUS | Status: DC | PRN
  Administered 2017-04-19: 120 mg via INTRAVENOUS

## 2017-04-19 MED ORDER — MENTHOL 3 MG MT LOZG: 1.0000 | LOZENGE | OROMUCOSAL | Status: DC | PRN

## 2017-04-19 MED ORDER — THROMBIN 5000 UNITS EX SOLR
CUTANEOUS | Status: AC
  Filled 2017-04-19: qty 5000

## 2017-04-19 MED ORDER — ACETAMINOPHEN 325 MG PO TABS: 650.0000 mg | ORAL_TABLET | ORAL | Status: DC | PRN

## 2017-04-19 MED ORDER — DEXAMETHASONE SODIUM PHOSPHATE 10 MG/ML IJ SOLN
INTRAMUSCULAR | Status: AC
  Filled 2017-04-19: qty 1

## 2017-04-19 MED ORDER — SCOPOLAMINE 1 MG/3DAYS TD PT72
MEDICATED_PATCH | TRANSDERMAL | Status: AC
  Filled 2017-04-19: qty 1

## 2017-04-19 MED ORDER — SUGAMMADEX SODIUM 200 MG/2ML IV SOLN
INTRAVENOUS | Status: AC
  Filled 2017-04-19: qty 2

## 2017-04-19 MED ORDER — PHENOL 1.4 % MT LIQD
1.0000 | OROMUCOSAL | Status: DC | PRN
  Administered 2017-04-19: 1 via OROMUCOSAL
  Filled 2017-04-19: qty 177

## 2017-04-19 MED ORDER — MEPERIDINE HCL 50 MG/ML IJ SOLN: 6.2500 mg | INTRAMUSCULAR | Status: DC | PRN

## 2017-04-19 MED ORDER — GELATIN ABSORBABLE MT POWD
OROMUCOSAL | Status: DC | PRN
  Administered 2017-04-19: 17:00:00 via TOPICAL

## 2017-04-19 MED ORDER — SODIUM CHLORIDE 0.9 % IV SOLN: 250.0000 mL | INTRAVENOUS | Status: DC

## 2017-04-19 MED ORDER — SCOPOLAMINE 1 MG/3DAYS TD PT72
MEDICATED_PATCH | TRANSDERMAL | Status: DC | PRN
  Administered 2017-04-19: 1 via TRANSDERMAL

## 2017-04-19 MED ORDER — SODIUM CHLORIDE 0.9% FLUSH
3.0000 mL | Freq: Two times a day (BID) | INTRAVENOUS | Status: DC
  Administered 2017-04-19: 3 mL via INTRAVENOUS

## 2017-04-19 MED ORDER — SODIUM CHLORIDE 0.9 % IR SOLN
Status: DC | PRN
  Administered 2017-04-19: 17:00:00

## 2017-04-19 MED ORDER — SODIUM CHLORIDE 0.9% FLUSH: 3.0000 mL | INTRAVENOUS | Status: DC | PRN

## 2017-04-19 MED ORDER — ONDANSETRON HCL 4 MG/2ML IJ SOLN
INTRAMUSCULAR | Status: AC
  Filled 2017-04-19: qty 2

## 2017-04-19 MED ORDER — PROPOFOL 10 MG/ML IV BOLUS
INTRAVENOUS | Status: AC
  Filled 2017-04-19: qty 20

## 2017-04-19 MED ORDER — TIOTROPIUM BROMIDE MONOHYDRATE 18 MCG IN CAPS
18.0000 ug | ORAL_CAPSULE | Freq: Every day | RESPIRATORY_TRACT | Status: DC
  Administered 2017-04-20: 18 ug via RESPIRATORY_TRACT
  Filled 2017-04-19: qty 5

## 2017-04-19 MED ORDER — LACTATED RINGERS IV SOLN
INTRAVENOUS | Status: DC | PRN
  Administered 2017-04-19 (×2): via INTRAVENOUS

## 2017-04-19 MED ORDER — CLONAZEPAM 0.5 MG PO TABS
0.5000 mg | ORAL_TABLET | Freq: Every day | ORAL | Status: DC
  Administered 2017-04-20: 0.5 mg via ORAL
  Filled 2017-04-19: qty 1

## 2017-04-19 MED ORDER — CEFAZOLIN SODIUM-DEXTROSE 2-4 GM/100ML-% IV SOLN
2.0000 g | INTRAVENOUS | Status: AC
  Administered 2017-04-19: 2 g via INTRAVENOUS
  Filled 2017-04-19: qty 100

## 2017-04-19 MED ORDER — ONDANSETRON HCL 4 MG/2ML IJ SOLN: 4.0000 mg | Freq: Four times a day (QID) | INTRAMUSCULAR | Status: DC | PRN

## 2017-04-19 MED ORDER — LACTATED RINGERS IV SOLN
INTRAVENOUS | Status: DC
  Administered 2017-04-19: 13:00:00 via INTRAVENOUS

## 2017-04-19 MED ORDER — ONDANSETRON HCL 4 MG PO TABS: 4.0000 mg | ORAL_TABLET | Freq: Four times a day (QID) | ORAL | Status: DC | PRN

## 2017-04-19 MED ORDER — EPHEDRINE 5 MG/ML INJ
INTRAVENOUS | Status: AC
  Filled 2017-04-19: qty 10

## 2017-04-19 SURGICAL SUPPLY — 65 items
BAG DECANTER FOR FLEXI CONT (MISCELLANEOUS) ×2 IMPLANT
BENZOIN TINCTURE PRP APPL 2/3 (GAUZE/BANDAGES/DRESSINGS) ×2 IMPLANT
BIT DRILL 13 (BIT) ×2 IMPLANT
BONE VIVIGEN FORMABLE 1.3CC (Bone Implant) ×4 IMPLANT
BUR MATCHSTICK NEURO 3.0 LAGG (BURR) ×2 IMPLANT
CANISTER SUCT 3000ML PPV (MISCELLANEOUS) ×2 IMPLANT
CARTRIDGE OIL MAESTRO DRILL (MISCELLANEOUS) ×1 IMPLANT
DECANTER SPIKE VIAL GLASS SM (MISCELLANEOUS) ×2 IMPLANT
DERMABOND ADVANCED (GAUZE/BANDAGES/DRESSINGS) ×1
DERMABOND ADVANCED .7 DNX12 (GAUZE/BANDAGES/DRESSINGS) ×1 IMPLANT
DIFFUSER DRILL AIR PNEUMATIC (MISCELLANEOUS) ×2 IMPLANT
DRAIN SNY 7 FPER (WOUND CARE) ×2 IMPLANT
DRAPE C-ARM 42X72 X-RAY (DRAPES) ×4 IMPLANT
DRAPE LAPAROTOMY 100X72 PEDS (DRAPES) ×2 IMPLANT
DRAPE MICROSCOPE LEICA (MISCELLANEOUS) ×2 IMPLANT
DRSG OPSITE POSTOP 4X6 (GAUZE/BANDAGES/DRESSINGS) ×2 IMPLANT
DURAPREP 6ML APPLICATOR 50/CS (WOUND CARE) ×2 IMPLANT
ELECT COATED BLADE 2.86 ST (ELECTRODE) ×2 IMPLANT
ELECT REM PT RETURN 9FT ADLT (ELECTROSURGICAL) ×2
ELECTRODE REM PT RTRN 9FT ADLT (ELECTROSURGICAL) ×1 IMPLANT
EVACUATOR SILICONE 100CC (DRAIN) ×2 IMPLANT
GAUZE SPONGE 4X4 12PLY STRL (GAUZE/BANDAGES/DRESSINGS) ×2 IMPLANT
GAUZE SPONGE 4X4 16PLY XRAY LF (GAUZE/BANDAGES/DRESSINGS) IMPLANT
GLOVE BIO SURGEON STRL SZ7 (GLOVE) IMPLANT
GLOVE BIO SURGEON STRL SZ8 (GLOVE) ×2 IMPLANT
GLOVE BIOGEL PI IND STRL 7.0 (GLOVE) IMPLANT
GLOVE BIOGEL PI INDICATOR 7.0 (GLOVE)
GLOVE ECLIPSE 9.0 STRL (GLOVE) ×2 IMPLANT
GLOVE EXAM NITRILE LRG STRL (GLOVE) IMPLANT
GLOVE EXAM NITRILE XL STR (GLOVE) IMPLANT
GLOVE EXAM NITRILE XS STR PU (GLOVE) IMPLANT
GLOVE INDICATOR 8.5 STRL (GLOVE) ×2 IMPLANT
GOWN STRL REUS W/ TWL LRG LVL3 (GOWN DISPOSABLE) ×2 IMPLANT
GOWN STRL REUS W/ TWL XL LVL3 (GOWN DISPOSABLE) ×2 IMPLANT
GOWN STRL REUS W/TWL 2XL LVL3 (GOWN DISPOSABLE) IMPLANT
GOWN STRL REUS W/TWL LRG LVL3 (GOWN DISPOSABLE) ×2
GOWN STRL REUS W/TWL XL LVL3 (GOWN DISPOSABLE) ×2
HALTER HD/CHIN CERV TRACTION D (MISCELLANEOUS) ×2 IMPLANT
HEMOSTAT POWDER KIT SURGIFOAM (HEMOSTASIS) ×2 IMPLANT
KIT BASIN OR (CUSTOM PROCEDURE TRAY) ×2 IMPLANT
KIT TURNOVER KIT B (KITS) ×2 IMPLANT
NEEDLE SPNL 20GX3.5 QUINCKE YW (NEEDLE) ×2 IMPLANT
NS IRRIG 1000ML POUR BTL (IV SOLUTION) ×2 IMPLANT
OIL CARTRIDGE MAESTRO DRILL (MISCELLANEOUS) ×2
PACK LAMINECTOMY NEURO (CUSTOM PROCEDURE TRAY) ×2 IMPLANT
PIN DISTRACTION 14MM (PIN) IMPLANT
PLATE 3 55XLCK NS SPNE CVD (Plate) ×1 IMPLANT
PLATE 3 ATLANTIS TRANS (Plate) ×1 IMPLANT
RUBBERBAND STERILE (MISCELLANEOUS) ×4 IMPLANT
SCREW ST 11X4.5XST FXANG NS (Screw) ×1 IMPLANT
SCREW ST 13X4.5XST VAR NS (Screw) ×1 IMPLANT
SCREW ST FIX 4 ATL 3120213 (Screw) ×12 IMPLANT
SCREW ST VAR 4.5 ATL (Screw) ×2 IMPLANT
SPACER COLONIAL LG 0 7MM (Spacer) ×2 IMPLANT
SPACER COLONIAL SZ 6-0 (Spacer) ×4 IMPLANT
SPONGE INTESTINAL PEANUT (DISPOSABLE) ×2 IMPLANT
SPONGE SURGIFOAM ABS GEL 100 (HEMOSTASIS) ×2 IMPLANT
STRIP CLOSURE SKIN 1/2X4 (GAUZE/BANDAGES/DRESSINGS) ×2 IMPLANT
SUT VIC AB 3-0 SH 8-18 (SUTURE) ×2 IMPLANT
SUT VIC AB 4-0 PS2 27 (SUTURE) ×2 IMPLANT
TAPE CLOTH 4X10 WHT NS (GAUZE/BANDAGES/DRESSINGS) IMPLANT
TOWEL GREEN STERILE (TOWEL DISPOSABLE) ×2 IMPLANT
TOWEL GREEN STERILE FF (TOWEL DISPOSABLE) ×2 IMPLANT
TRAP SPECIMEN MUCOUS 40CC (MISCELLANEOUS) ×2 IMPLANT
WATER STERILE IRR 1000ML POUR (IV SOLUTION) ×2 IMPLANT

## 2017-04-19 NOTE — Anesthesia Postprocedure Evaluation (Signed)
Anesthesia Post Note  Patient: Jennifer [name]  Procedure(s) Performed: Anterior Cervical Decompression Fusion Cervical Three-Four, Cervical Four-Five, Cervical Five-Six (N/A )     Patient location during evaluation: PACU Anesthesia Type: General Level of consciousness: awake and alert Pain management: pain level controlled Vital Signs Assessment: post-procedure vital signs reviewed and stable Respiratory status: spontaneous breathing, nonlabored ventilation, respiratory function stable and patient connected to nasal cannula oxygen Cardiovascular status: blood pressure returned to baseline and stable Postop Assessment: no apparent nausea or vomiting Anesthetic complications: no    Last Vitals:  Vitals:   04/19/17 1918 04/19/17 1931  BP: (!) 114/92 132/61  Pulse: 96 93  Resp: 16 12  Temp:  36.7 C  SpO2: 97% 97%    Last Pain:  Vitals:   04/19/17 1931  TempSrc:   PainSc: Christiana E Daxter Paule

## 2017-04-19 NOTE — Op Note (Signed)
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  Postoperative diagnosis: Same  Procedure: Anterior cervical discectomies and fusion at C3-4, C4-5, C5-6 utilizing the globus peek TBS coated cages packed with locally harvested autograft mixed with vivigen.  #2 anterior cervical plating C3-C6 utilizing the [employer] Atlantis translational plate with 6 fixed angle 13 mm screws to rescue screws  Surgeon: Dominica Severin Tashay Bozich  Asst.: Mallie Mussel pool  Anesthesia: Gen.  EBL: Minimal  History of present illness: 72 year old female with long-standing neck pain bilateral shoulder and arm pain worse on the right with weakness in her right arm. Workup revealed severe scoliosis cervical stenosis and spondylosis at C3-4 C4-5 C5-6. Due to patient progressive clinical syndrome imaging findings and failure conservative treatment I recommended anterior cervical discectomies and fusion at those levels. I extensively went over the risks and benefits of the operation with her as well as perioperative course expectations of outcome and alternatives of surgery and she understood and agreed to proceed 4.  Operative procedure: Patient brought into the or was induced under general anesthesia positioned supine the neck in slight extension in 5 pounds of halter traction the right side of her neck was prepped And draped in routine sterile fashion preoperative x-ray localize the appropriate level. Annulotomy's were made at that level the longus close reflected laterally and self-retaining retractors were placed. There was a scoliotic deformity with very large right sided spurs pushing the exposure over to the left however I did free up all the longus of the right-sided spurs to try to identify normal midline anatomy. Then first working at C3-4 this disc space was drilled down and under Mike's cup illumination further drilled down the posterior annuluscomplex  allowed identification of the C4 pedicle. Marching laterally identified the right C4 pedicles well and identified by left-to-right midline. The there was a large spurs coming off the C3 and C4. Vertebral body causing severe central stenosis and marked uncinate hypertrophy causing severe foraminal stenosis. All this was unroofed and removed decompressing both C4 nerve roots and the spinal canal and spinal cord. Aggressive abutting both endplates. Then attention was taken to C5-6 and a similar fashion C5-6 was drilled down aggressively under biting both endplates removed large posterior spurs coming off the C5-6 vertebral bodies or was a dense amount of anterior uncinate hypertrophy here as well removed all this decompression both C C6 nerve roots. Then tension was taken to C4-5 and a similar fashion C4-5 was drilled down again the pathology here was mostly right-sided large spurring coming off the C4 vertebral body aggressively under been decompress the central canal both C5 nerve roots were identified and decompressed and skeletonized flush with pedicle. At this point I sized up globus peek spacers packed with local autograft mix put 6 mm grafts at C3-4 and C4-5 and a 7 mm at C5-6 and selected a 55 mm last translational plate all screws excellent purchase locking mechanism was engaged wounds and copious irrigated meticulous hemostasis was maintained there was 2 screws that stripped and I replaced with a rescue's on the right at C 3 and C4. I then placed a JP drain and closed the wound in layers with interrupted Vicryl running 4 subcuticular in the skin Dermabond benzo and Steri-Strips and sterile dressing. Patient with recovered in stable condition. At the case all needle counts sponge counts were correct.

## 2017-04-19 NOTE — Anesthesia Procedure Notes (Signed)
Procedure Name: Intubation Date/Time: 04/19/2017 4:35 PM Performed by: Teressa Lower., CRNA Pre-anesthesia Checklist: Patient identified, Emergency Drugs available, Suction available and Patient being monitored Patient Re-evaluated:Patient Re-evaluated prior to induction Oxygen Delivery Method: Circle system utilized Preoxygenation: Pre-oxygenation with 100% oxygen Induction Type: IV induction Ventilation: Mask ventilation without difficulty Laryngoscope Size: Miller and 2 Grade View: Grade I Tube type: Oral Tube size: 7.0 mm Number of attempts: 1 Airway Equipment and Method: Stylet and Oral airway Placement Confirmation: ETT inserted through vocal cords under direct vision,  positive ETCO2 and breath sounds checked- equal and bilateral Secured at: 20 cm Tube secured with: Tape Dental Injury: Teeth and Oropharynx as per pre-operative assessment

## 2017-04-19 NOTE — Transfer of Care (Signed)
Immediate Anesthesia Transfer of Care Note  Patient: Jennifer [name]  Procedure(s) Performed: Anterior Cervical Decompression Fusion Cervical Three-Four, Cervical Four-Five, Cervical Five-Six (N/A )  Patient Location: PACU  Anesthesia Type:General  Level of Consciousness: awake, alert  and oriented  Airway & Oxygen Therapy: Patient Spontanous Breathing and Patient connected to face mask oxygen  Post-op Assessment: Report given to RN and Post -op Vital signs reviewed and stable  Post vital signs: Reviewed and stable  Last Vitals:  Vitals Value Taken Time  BP 122/49 04/19/2017  6:48 PM  Temp    Pulse 98 04/19/2017  6:49 PM  Resp 23 04/19/2017  6:49 PM  SpO2 97 % 04/19/2017  6:49 PM  Vitals shown include unvalidated device data.  Last Pain:  Vitals:   04/19/17 1847  TempSrc:   PainSc: 0-No pain         Complications: No apparent anesthesia complications

## 2017-04-19 NOTE — Anesthesia Preprocedure Evaluation (Signed)
Anesthesia Evaluation  Patient identified by MRN, date of birth, ID band Patient awake    Reviewed: Allergy & Precautions, NPO status , Patient's Chart, lab work & pertinent test results  Airway Mallampati: II  TM Distance: >3 FB Neck ROM: Full    Dental no notable dental hx.    Pulmonary asthma ,    Pulmonary exam normal breath sounds clear to auscultation       Cardiovascular hypertension, Pt. on medications Normal cardiovascular exam Rhythm:Regular Rate:Normal     Neuro/Psych negative neurological ROS  negative psych ROS   GI/Hepatic Neg liver ROS, GERD  Medicated and Controlled,  Endo/Other  negative endocrine ROS  Renal/GU negative Renal ROS  negative genitourinary   Musculoskeletal negative musculoskeletal ROS (+)   Abdominal   Peds negative pediatric ROS (+)  Hematology negative hematology ROS (+)   Anesthesia Other Findings   Reproductive/Obstetrics negative OB ROS                             Anesthesia Physical Anesthesia Plan  ASA: II  Anesthesia Plan: General   Post-op Pain Management:    Induction: Intravenous  PONV Risk Score and Plan: 3  Airway Management Planned: Oral ETT  Additional Equipment:   Intra-op Plan:   Post-operative Plan: Extubation in OR  Informed Consent: I have reviewed the patients History and Physical, chart, labs and discussed the procedure including the risks, benefits and alternatives for the proposed anesthesia with the patient or authorized representative who has indicated his/her understanding and acceptance.   Dental advisory given  Plan Discussed with: CRNA  Anesthesia Plan Comments:         Anesthesia Quick Evaluation

## 2017-04-19 NOTE — H&P (Signed)
Jennifer [name] is an 72 y.o. female.   Chief Complaint: Neck and right greater left arm pain HPI: 72 year old female is a long-standing neck pain bilateral arm pain worse on the right. Patient had weakness in her right arm bicep tricep primarily workup revealed severe cervical spondylosis with stenosis C3-4 C4-5 C5-6. She has significant foraminal stenosis at all those levels. Due to patient progressive clinical syndrome failed conservative treatment I recommended anterior cervical discectomies and fusion at all 3 of those levels. I've extensively gone over the risks and benefits of the operation patient as well as perioperative course expectations of outcome and alternatives of surgery and she understood and agreed to proceed forward.  Past Medical History:  Diagnosis Date  . Anxiety    panic attacks  . Arthritis   . Asthma   . Bronchitis   . Depression   . 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 Gastritis   . GERD (gastroesophageal reflux disease)   . Hypertension   . PONV (postoperative nausea and vomiting)   . Scoliosis   . Ulcer     Past Surgical History:  Procedure Laterality Date  . ABDOMINAL HYSTERECTOMY    . CHOLECYSTECTOMY  1996  . DILATION AND CURETTAGE OF UTERUS  1971  . LAPAROSCOPIC REMOVAL OF MESENTERIC MASS    . [place]  . PARTIAL HYSTERECTOMY  1981   prolapsed uterus  . TUBAL LIGATION  1978    Family History  Problem Relation Age of Onset  . Stroke Mother   . Cancer Mother        Brain   . CVA Maternal Grandmother   . Cancer Father   . Breast cancer Neg Hx   . Colon cancer Neg Hx    Social History:  reports that she has never smoked. She has never used smokeless tobacco. She reports that she does not drink alcohol or use drugs.  Allergies:  Allergies  Allergen Reactions  . Advair Diskus [Fluticasone-Salmeterol] Other (See Comments)    Causes blisters in her mouth  . Codeine     Unknown reaction     Medications Prior to Admission  Medication Sig Dispense Refill  . citalopram (CELEXA) 40 MG tablet Take 1 tablet (40 mg total) by mouth daily. 90 tablet 1  . clonazePAM (KLONOPIN) 0.5 MG tablet TAKE ONE-HALF TO ONE TABLET AS NEEDED. (Patient taking differently: Take 0.25 - 0.5 mg by mouth once daily as needed for anxiety) 30 tablet 1  . ibuprofen (ADVIL,MOTRIN) 200 MG tablet Take 200 mg by mouth 2 (two) times daily as needed for moderate pain.    . meclizine (ANTIVERT) 25 MG tablet Take 1 tablet (25 mg total) 2 (two) times daily by mouth. (Patient taking differently: Take 25 mg by mouth 2 (two) times daily as needed for dizziness. ) 20 tablet 0  . Multiple Vitamin (MULTIVITAMIN WITH MINERALS) TABS tablet Take 1 tablet by mouth daily.    . ranitidine (ZANTAC) 150 MG tablet Take 1 tablet (150 mg total) by mouth 2 (two) times daily. 60 tablet 2  . tiotropium (SPIRIVA HANDIHALER) 18 MCG inhalation capsule Place 1 capsule (18 mcg total) into inhaler and inhale daily. 30 capsule 2  . doxycycline (VIBRA-TABS) 100 MG tablet Take 1 tablet (100 mg total) by mouth 2 (two) times daily. (Patient not taking: Reported on 03/24/2017) 20 tablet 0  . fluticasone (FLONASE) 50 MCG/ACT nasal spray Place 2  sprays into the nose daily as needed for rhinitis.     . hydrochlorothiazide (HYDRODIURIL) 25 MG tablet Take 1 tablet (25 mg total) by mouth daily. 30 tablet 3  . ipratropium (ATROVENT) 0.02 % nebulizer solution Take 2.5 mLs (0.5 mg total) by nebulization 4 (four) times daily. (Patient taking differently: Take 0.5 mg by nebulization every 6 (six) hours as needed for shortness of breath. ) 75 mL 1  . levalbuterol (XOPENEX HFA) 45 MCG/ACT inhaler Inhale 1-2 puffs into the lungs every 6 (six) hours as needed for wheezing or shortness of breath.     . mupirocin ointment (BACTROBAN) 2 % Apply to affected area bid (Patient taking differently: Apply 1 application topically daily as needed (irritation). ) 22 g 0  .  pantoprazole (PROTONIX) 40 MG tablet Take 1 tablet (40 mg total) by mouth daily. (Patient not taking: Reported on 04/07/2017) 30 tablet 9  . pravastatin (PRAVACHOL) 10 MG tablet Take 1 tablet (10 mg total) by mouth daily. 90 tablet 0  . predniSONE (DELTASONE) 10 MG tablet Take 6 tablets x 1 day and then decrease by 1/2 tablet per day until down to zero mg. (Patient not taking: Reported on 03/24/2017) 39 tablet 0  . Probiotic Product (PROBIOTIC DAILY PO) Take 1 capsule by mouth daily.       No results found for this or any previous visit (from the past 48 hour(s)). No results found.  Review of Systems  Musculoskeletal: Positive for joint pain and neck pain.  Neurological: Positive for tingling and sensory change.    Blood pressure 111/60, pulse 66, temperature 98 F (36.7 C), temperature source Oral, resp. rate 18, height 5' 1.5" (1.562 m), weight 71.2 kg (157 lb), SpO2 99 %. Physical Exam  Constitutional: She is oriented to person, place, and time. She appears well-developed.  HENT:  Head: Normocephalic.  Eyes: Pupils are equal, round, and reactive to light.  Neck: Normal range of motion.  Cardiovascular: Normal rate.  Respiratory: Effort normal.  GI: Soft. Bowel sounds are normal.  Neurological: She is alert and oriented to person, place, and time. She has normal strength. GCS eye subscore is 4. GCS verbal subscore is 5. GCS motor subscore is 6.  Strength is 5 out of 5 deltoid bilaterally left upper extremities 5 out of 5 biceps triceps wrist flexion extension and intrinsics. Right upper cavity has significant tricep weakness at 4 out of 5 and some wrist flexion and extension weakness.  Skin: Skin is warm and dry.     Assessment/Plan 72 year old patient presents for ACDF C3-4 C4-5 C5-6.  Divine Hansley P, MD 04/19/2017, 3:49 PM

## 2017-04-20 DIAGNOSIS — M5412 Radiculopathy, cervical region: Secondary | ICD-10-CM | POA: Diagnosis present

## 2017-04-20 DIAGNOSIS — M4722 Other spondylosis with radiculopathy, cervical region: Secondary | ICD-10-CM | POA: Diagnosis not present

## 2017-04-20 MED ORDER — OXYCODONE HCL 10 MG PO TABS: 10.0000 mg | ORAL_TABLET | ORAL | 0 refills | Status: DC | PRN

## 2017-04-20 MED ORDER — CYCLOBENZAPRINE HCL 10 MG PO TABS: 10.0000 mg | ORAL_TABLET | Freq: Three times a day (TID) | ORAL | 0 refills | Status: DC | PRN

## 2017-04-20 MED FILL — Thrombin For Soln 20000 Unit: CUTANEOUS | Qty: 1 | Status: AC

## 2017-04-20 MED FILL — Thrombin For Soln 5000 Unit: CUTANEOUS | Qty: 5000 | Status: AC

## 2017-04-20 NOTE — Care Management CC44 (Signed)
Condition Code 44 Documentation Completed  Patient Details  Name: Jennifer [name] MRN: [id number] Date of Birth: 11/19/1945   Condition Code 44 given:  Yes Patient signature on Condition Code 44 notice:  Yes Documentation of 2 MD's agreement:  Yes Code 44 added to claim:  Yes    Ninfa Meeker, RN 04/20/2017, 10:08 AM

## 2017-04-20 NOTE — Evaluation (Signed)
Occupational Therapy Evaluation Patient Details Name: Jennifer [name] MRN: [id number] DOB: [birth year] Today's Date: 04/20/2017    History of Present Illness 72 yo female s/p ACDF C3-6  Past Medical History:  Diagnosis Date  . Anxiety    panic attacks  . Arthritis   . Asthma   . Bronchitis   . Depression   . 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 Gastritis   . GERD (gastroesophageal reflux disease)   . Hypertension   . PONV (postoperative nausea and vomiting)   . Scoliosis   . Ulcer       Clinical Impression   Patient evaluated by Occupational Therapy with no further acute OT needs identified. All education has been completed and the patient has no further questions. See below for any follow-up Occupational Therapy or equipment needs. OT to sign off. Thank you for referral.      Follow Up Recommendations  No OT follow up    Equipment Recommendations  None recommended by OT    Recommendations for Other Services       Precautions / Restrictions Precautions Precautions: Cervical Precaution Comments: handout provided and education completed for adls. Required Braces or Orthoses: Cervical Brace Cervical Brace: Soft collar      Mobility Bed Mobility Overal bed mobility: Needs Assistance Bed Mobility: Supine to Sit;Sit to Supine     Supine to sit: Min assist Sit to supine: Min guard   General bed mobility comments: pt requires (A) to elevate trunk from surface  Transfers Overall transfer level: Needs assistance Equipment used: 1 person hand held assist Transfers: Sit to/from Stand Sit to Stand: Min assist         General transfer comment: pt holding therapist hand and reports "i have vestibular issues'    Balance                                           ADL either performed or assessed with clinical judgement   ADL Overall ADL's : Modified independent         Cervical precautions ( handout provided): Educated  patient on don doff brace with return demonstration, educated on oral care using cups, washing face with cloth, never to wash directly on incision site, avoid neck rotation flexion and extension, positioning with pillows in chair for bil UE, sleeping positioning, avoiding pushing / pulling with bil UE, s.  Pt educated on need to notify doctor / RN of swallowing changes or choking..                                 General ADL Comments: pt able to cross bil Le . pt able to don doff brace in bathroom mirror, educate don dressing and bathing, pain management cervical precautions and bed mobility. daughter in the room taking notes on phone. son and wife arriving at the end of session     Vision   Vision Assessment?: No apparent visual deficits     Perception     Praxis      Pertinent Vitals/Pain Pain Assessment: No/denies pain     Hand Dominance Right   Extremity/Trunk Assessment Upper Extremity Assessment Upper Extremity Assessment: Overall WFL for tasks assessed(pt reports no deficits / normal sensation)   Lower Extremity Assessment Lower Extremity  Assessment: Overall WFL for tasks assessed   Cervical / Trunk Assessment Cervical / Trunk Assessment: Other exceptions Cervical / Trunk Exceptions: s/p surg   Communication Communication Communication: No difficulties   Cognition Arousal/Alertness: Awake/alert Behavior During Therapy: WFL for tasks assessed/performed Overall Cognitive Status: Within Functional Limits for tasks assessed                                     General Comments  dressing dry and intact    Exercises     Shoulder Instructions      Home Living Family/patient expects to be discharged to:: Private residence Living Arrangements: Spouse/significant other Available Help at Discharge: Family Type of Home: House Home Access: Level entry     [place]: One level     Bathroom Shower/Tub: Engineer, site: Standard     Home Equipment: Financial controller: Reacher Additional Comments: pt has spouse and daughter (A) for home. pt reports frequent vestibular issues and having "exercises and medication in the past" Spouse name "doug"pt enjoys reading      Prior Functioning/Environment Level of Independence: Independent                 OT Problem List:        OT Treatment/Interventions:      OT Goals(Current goals can be found in the care plan section) Acute Rehab OT Goals Patient Stated Goal: none stated but feel able to go home today Potential to Achieve Goals: Good  OT Frequency:     Barriers to D/C:            Co-evaluation              AM-PAC PT "6 Clicks" Daily Activity     Outcome Measure Help from another person eating meals?: None Help from another person taking care of personal grooming?: None Help from another person toileting, which includes using toliet, bedpan, or urinal?: None Help from another person bathing (including washing, rinsing, drying)?: None Help from another person to put on and taking off regular upper body clothing?: None Help from another person to put on and taking off regular lower body clothing?: None 6 Click Score: 24   End of Session Equipment Utilized During Treatment: Gait belt;Cervical collar Nurse Communication: Mobility status;Precautions  Activity Tolerance: Patient tolerated treatment well Patient left: in bed;with call bell/phone within reach;with family/visitor present(in bed due to x4 members in small room)  OT Visit Diagnosis: Unsteadiness on feet (R26.81)                Time: 0950-1008 OT Time Calculation (min): 18 min Charges:  OT General Charges $OT Visit: 1 Visit OT Evaluation $OT Eval Moderate Complexity: 1 Mod G-Codes:      Jeri Modena   OTR/L Pager: [phone] Office: [phone] .   Parke Poisson B 04/20/2017, 2:56 PM

## 2017-04-20 NOTE — Progress Notes (Signed)
Patient alert and oriented, mae's well, voiding adequate amount of urine, swallowing without difficulty, no c/o pain at time of discharge. Patient discharged home with family. Script and discharged instructions given to patient. Patient and family stated understanding of instructions given. Patient has an appointment with Dr. Cram 

## 2017-04-20 NOTE — Discharge Summary (Signed)
  Physician Discharge Summary  [name] MRN: [id number] DOB/AGE: [birth year] 72 y.o. Estimated body mass index is 29.18 kg/m as calculated from the following:   Height as of this encounter: 5' 1.5" (1.562 m).   Weight as of this encounter: 71.2 kg (157 lb).   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  Consults: Significant Diagnostic Studies: Treatments:ACDF C3-4, C4-5 C5-6. Discharge Exam: Blood pressure (!) 105/57, pulse 77, temperature 98.6 F (37 C), temperature source Oral, resp. rate 16, height 5' 1.5" (1.562 m), weight 71.2 kg (157 lb), SpO2 95 %. Strength 5 out of 5 wound clean dry and intact  Disposition: home      Signed: Preslei Blakley P 04/20/2017, 7:52 AM

## 2017-04-20 NOTE — Care Management Obs Status (Signed)
[facility] NOTIFICATION   Patient Details  Name: Jennifer [name] MRN: [id number] Date of Birth: Aug 21, 1945   Medicare Observation Status Notification Given:  Yes    Ninfa Meeker, RN 04/20/2017, 10:08 AM

## 2017-04-20 NOTE — Discharge Instructions (Signed)

## 2017-04-21 ENCOUNTER — Encounter (HOSPITAL_COMMUNITY): Payer: Self-pay | Admitting: Neurosurgery

## 2017-04-22 ENCOUNTER — Encounter (HOSPITAL_COMMUNITY): Payer: Self-pay | Admitting: Neurosurgery

## 2017-05-03 ENCOUNTER — Other Ambulatory Visit: Payer: Self-pay | Admitting: Family

## 2017-06-21 ENCOUNTER — Other Ambulatory Visit: Payer: Self-pay | Admitting: Internal Medicine

## 2017-06-22 NOTE — Telephone Encounter (Signed)
Last OV 03/24/17 Next OV 06/28/17 Last refill 02/17/17

## 2017-06-23 ENCOUNTER — Other Ambulatory Visit: Payer: Self-pay | Admitting: Internal Medicine

## 2017-06-24 ENCOUNTER — Ambulatory Visit: Payer: Medicare PPO

## 2017-06-24 NOTE — Telephone Encounter (Signed)
ok'd rx for clonazepam #30 with one refill.  rx signed and placed in box.

## 2017-06-28 ENCOUNTER — Other Ambulatory Visit (INDEPENDENT_AMBULATORY_CARE_PROVIDER_SITE_OTHER): Payer: Medicare PPO

## 2017-06-28 DIAGNOSIS — E7800 Pure hypercholesterolemia, unspecified: Secondary | ICD-10-CM | POA: Diagnosis not present

## 2017-06-28 DIAGNOSIS — I10 Essential (primary) hypertension: Secondary | ICD-10-CM

## 2017-06-28 DIAGNOSIS — R739 Hyperglycemia, unspecified: Secondary | ICD-10-CM | POA: Diagnosis not present

## 2017-06-28 LAB — BASIC METABOLIC PANEL
BUN: 8 mg/dL (ref 6–23)
CALCIUM: 9.6 mg/dL (ref 8.4–10.5)
CHLORIDE: 101 meq/L (ref 96–112)
CO2: 33 meq/L — AB (ref 19–32)
Creatinine, Ser: 0.59 mg/dL (ref 0.40–1.20)
GFR: 106.97 mL/min (ref >60.00)
Glucose, Bld: 100 mg/dL — ABNORMAL HIGH (ref 70–99)
Potassium: 3.7 mEq/L (ref 3.5–5.1)
SODIUM: 140 meq/L (ref 135–145)

## 2017-06-28 LAB — LIPID PANEL
CHOL/HDL RATIO: 3
Cholesterol: 175 mg/dL (ref 0–200)
HDL: 55.9 mg/dL (ref >39.00)
LDL CALC: 101 mg/dL — AB (ref 0–99)
NonHDL: 118.99
Triglycerides: 88 mg/dL (ref 0.0–149.0)
VLDL: 17.6 mg/dL (ref 0.0–40.0)

## 2017-06-28 LAB — HEPATIC FUNCTION PANEL
ALBUMIN: 4.2 g/dL (ref 3.5–5.2)
ALK PHOS: 52 U/L (ref 39–117)
ALT: 10 U/L (ref 0–35)
AST: 19 U/L (ref 0–37)
Bilirubin, Direct: 0.1 mg/dL (ref 0.0–0.3)
Total Bilirubin: 0.5 mg/dL (ref 0.2–1.2)
Total Protein: 6.5 g/dL (ref 6.0–8.3)

## 2017-06-28 LAB — HEMOGLOBIN A1C: Hgb A1c MFr Bld: 5.7 % (ref 4.6–6.5)

## 2017-06-30 ENCOUNTER — Encounter: Payer: Self-pay | Admitting: Internal Medicine

## 2017-06-30 ENCOUNTER — Ambulatory Visit (INDEPENDENT_AMBULATORY_CARE_PROVIDER_SITE_OTHER): Payer: Medicare PPO | Admitting: Internal Medicine

## 2017-06-30 VITALS — BP 110/62 | HR 74 | Temp 98.0°F | Resp 18 | Wt 153.6 lb

## 2017-06-30 DIAGNOSIS — K2100 Gastro-esophageal reflux disease with esophagitis, without bleeding: Secondary | ICD-10-CM

## 2017-06-30 DIAGNOSIS — J984 Other disorders of lung: Secondary | ICD-10-CM | POA: Diagnosis not present

## 2017-06-30 DIAGNOSIS — Z1231 Encounter for screening mammogram for malignant neoplasm of breast: Secondary | ICD-10-CM | POA: Diagnosis not present

## 2017-06-30 DIAGNOSIS — E7800 Pure hypercholesterolemia, unspecified: Secondary | ICD-10-CM | POA: Diagnosis not present

## 2017-06-30 DIAGNOSIS — J4520 Mild intermittent asthma, uncomplicated: Secondary | ICD-10-CM | POA: Diagnosis not present

## 2017-06-30 DIAGNOSIS — D72829 Elevated white blood cell count, unspecified: Secondary | ICD-10-CM | POA: Diagnosis not present

## 2017-06-30 DIAGNOSIS — M79601 Pain in right arm: Secondary | ICD-10-CM | POA: Diagnosis not present

## 2017-06-30 DIAGNOSIS — I10 Essential (primary) hypertension: Secondary | ICD-10-CM | POA: Diagnosis not present

## 2017-06-30 DIAGNOSIS — R739 Hyperglycemia, unspecified: Secondary | ICD-10-CM | POA: Diagnosis not present

## 2017-06-30 DIAGNOSIS — Z1239 Encounter for other screening for malignant neoplasm of breast: Secondary | ICD-10-CM

## 2017-06-30 MED ORDER — PRAVASTATIN SODIUM 20 MG PO TABS: 20.0000 mg | ORAL_TABLET | Freq: Every day | ORAL | 3 refills | Status: DC

## 2017-06-30 NOTE — Progress Notes (Signed)
[name], female   DOB: Feb 19, 1945, 72 y.o.   MRN: [id number]   Subjective:    [name], female    DOB: 03/29/1945, 72 y.o.   MRN: [id number]  HPI  Patient here for a scheduled follow up.  Is s/p anterior cervical decompression surgery performed by Dr Saintclair Halsted.  Neck is doing well.  Still having some discomfort in her right arm.  Applying heat.  Has f/u planned 07/20/17.  States surgery is aware.  No chest pain.  No sob.  Breathing has been doing well.  No abdominal pain.  Planning for EGD.  Bowels stable.  Overall she feels she is doing relatively well.     Past Medical History:  Diagnosis Date  . Anxiety    panic attacks  . Arthritis   . Asthma   . Bronchitis   . Depression   . 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 Gastritis   . GERD (gastroesophageal reflux disease)   . Hypertension   . PONV (postoperative nausea and vomiting)   . Scoliosis   . Ulcer    Past Surgical History:  Procedure Laterality Date  . ABDOMINAL HYSTERECTOMY    . ANTERIOR CERVICAL DECOMP/DISCECTOMY FUSION N/A 04/19/2017   Procedure: Anterior Cervical Decompression Fusion Cervical Three-Four, Cervical Four-Five, Cervical Five-Six;  Surgeon: Kary Kos, MD;  Location: [facility];  Service: Neurosurgery;  Laterality: N/A;  Anterior Cervical Decompression Fusion Cervical Three-Four, Cervical Four-Five, Cervical Five-Six  . CHOLECYSTECTOMY  1996  . DILATION AND CURETTAGE OF UTERUS  1971  . LAPAROSCOPIC REMOVAL OF MESENTERIC MASS    . [place]  . PARTIAL HYSTERECTOMY  1981   prolapsed uterus  . TUBAL LIGATION  1978   Family History  Problem Relation Age of Onset  . Stroke Mother   . Cancer Mother        Brain   . CVA Maternal Grandmother   . Cancer Father   . Breast cancer Neg Hx   . Colon cancer Neg Hx    Social History   Socioeconomic History  . Marital status: Married    Spouse name: Not on file  . Number of children: 2  . Years of  education: Not on file  . Highest education level: Not on file  Occupational History  . Occupation: retired Tour manager  . Financial resource strain: Not on file  . Food insecurity:    Worry: Not on file    Inability: Not on file  . Transportation needs:    Medical: Not on file    Non-medical: Not on file  Tobacco Use  . Smoking status: Never Smoker  . Smokeless tobacco: Never Used  Substance and Sexual Activity  . Alcohol use: No    Alcohol/week: 0.0 oz  . Drug use: No  . Sexual activity: Not on file  Lifestyle  . Physical activity:    Days per week: Not on file    Minutes per session: Not on file  . Stress: Not on file  Relationships  . Social connections:    Talks on phone: Not on file    Gets together: Not on file    Attends religious service: Not on file    Active member of club or organization: Not on file    Attends meetings of clubs or organizations: Not on file    Relationship status: Not on file  Other Topics Concern  . Not on file  Social History Narrative  . Not on file    Outpatient Encounter Medications as of 06/30/2017  Medication Sig  . citalopram (CELEXA) 40 MG tablet Take 1 tablet (40 mg total) by mouth daily.  . clonazePAM (KLONOPIN) 0.5 MG tablet TAKE ONE-HALF TO ONE TABLET AS NEEDED.  . fluticasone (FLONASE) 50 MCG/ACT nasal spray Place 2 sprays into the nose daily as needed for rhinitis.   . hydrochlorothiazide (HYDRODIURIL) 25 MG tablet Take 1 tablet (25 mg total) by mouth daily.  Marland Kitchen ibuprofen (ADVIL,MOTRIN) 200 MG tablet Take 200 mg by mouth 2 (two) times daily as needed for moderate pain.  Marland Kitchen ipratropium (ATROVENT) 0.02 % nebulizer solution Take 2.5 mLs (0.5 mg total) by nebulization 4 (four) times daily. (Patient taking differently: Take 0.5 mg by nebulization every 6 (six) hours as needed for shortness of breath. )  . levalbuterol (XOPENEX HFA) 45 MCG/ACT inhaler Inhale 1-2 puffs into the lungs every 6 (six) hours as needed for wheezing  or shortness of breath.   . Multiple Vitamin (MULTIVITAMIN WITH MINERALS) TABS tablet Take 1 tablet by mouth daily.  . mupirocin ointment (BACTROBAN) 2 % Apply to affected area bid (Patient taking differently: Apply 1 application topically daily as needed (irritation). )  . pravastatin (PRAVACHOL) 20 MG tablet Take 1 tablet (20 mg total) by mouth daily.  . Probiotic Product (PROBIOTIC DAILY PO) Take 1 capsule by mouth daily.   . ranitidine (ZANTAC) 150 MG tablet Take 1 tablet (150 mg total) by mouth 2 (two) times daily.  . [DISCONTINUED] cyclobenzaprine (FLEXERIL) 10 MG tablet Take 1 tablet (10 mg total) by mouth 3 (three) times daily as needed for muscle spasms.  . [DISCONTINUED] meclizine (ANTIVERT) 25 MG tablet Take 1 tablet (25 mg total) 2 (two) times daily by mouth. (Patient taking differently: Take 25 mg by mouth 2 (two) times daily as needed for dizziness. )  . [DISCONTINUED] oxyCODONE 10 MG TABS Take 1 tablet (10 mg total) by mouth every 3 (three) hours as needed for severe pain ((score 7 to 10)).  . [DISCONTINUED] pravastatin (PRAVACHOL) 10 MG tablet Take 1 tablet (10 mg total) by mouth daily.  . [DISCONTINUED] tiotropium (SPIRIVA HANDIHALER) 18 MCG inhalation capsule Place 1 capsule (18 mcg total) into inhaler and inhale daily.   No facility-administered encounter medications on file as of 06/30/2017.     Review of Systems  Constitutional: Negative for appetite change and unexpected weight change.  HENT: Negative for congestion and sinus pressure.   Respiratory: Negative for cough, chest tightness and shortness of breath.   Cardiovascular: Negative for chest pain, palpitations and leg swelling.  Gastrointestinal: Negative for abdominal pain, diarrhea, nausea and vomiting.  Genitourinary: Negative for difficulty urinating and dysuria.  Musculoskeletal: Negative for myalgias.       Some persistent right arm pain.  S/p neck surgery.    Skin: Negative for color change and rash.    Neurological: Negative for dizziness, light-headedness and headaches.  Psychiatric/Behavioral: Negative for agitation and dysphoric mood.       Objective:    Physical Exam  Constitutional: She appears well-developed and well-nourished. No distress.  HENT:  Nose: Nose normal.  Mouth/Throat: Oropharynx is clear and moist.  Neck: Neck supple. No thyromegaly present.  Cardiovascular: Normal rate and regular rhythm.  Pulmonary/Chest: Breath sounds normal. No respiratory distress. She has no wheezes.  Abdominal: Soft. Bowel sounds are normal. There is no tenderness.  Musculoskeletal: She exhibits no  edema or tenderness.  Lymphadenopathy:    She has no cervical adenopathy.  Skin: No rash noted. No erythema.  Psychiatric: She has a normal mood and affect. Her behavior is normal.    BP 110/62 (BP Location: Left Arm, Patient Position: Sitting, Cuff Size: Normal)   Pulse 74   Temp 98 F (36.7 C) (Oral)   Resp 18   Wt 153 lb 9.6 oz (69.7 kg)   SpO2 96%   BMI 28.55 kg/m  Wt Readings from Last 3 Encounters:  06/30/17 153 lb 9.6 oz (69.7 kg)  04/19/17 157 lb (71.2 kg)  04/13/17 157 lb 9.6 oz (71.5 kg)     Lab Results  Component Value Date   WBC 11.4 (H) 04/13/2017   HGB 14.1 04/13/2017   HCT 42.8 04/13/2017   PLT 236 04/13/2017   GLUCOSE 100 (H) 06/28/2017   CHOL 175 06/28/2017   TRIG 88.0 06/28/2017   HDL 55.90 06/28/2017   LDLDIRECT 148.4 11/03/2012   LDLCALC 101 (H) 06/28/2017   ALT 10 06/28/2017   AST 19 06/28/2017   NA 140 06/28/2017   K 3.7 06/28/2017   CL 101 06/28/2017   CREATININE 0.59 06/28/2017   BUN 8 06/28/2017   CO2 33 (H) 06/28/2017   TSH 1.40 09/01/2016   HGBA1C 5.7 06/28/2017       Assessment & Plan:   Problem List Items Addressed This Visit    Asthma    Breathing stable.  Doing well.  Follow.       Chronic restrictive lung disease    Has been followed by pulmonary.  Breathing stable.        Essential hypertension, benign    Blood  pressure under good control.  Continue same medication regimen.  Follow pressures.  Follow metabolic panel.        Relevant Medications   pravastatin (PRAVACHOL) 20 MG tablet   Other Relevant Orders   TSH   Basic metabolic panel   GERD (gastroesophageal reflux disease)    On zantac.  Planning for f/u EGD.        Hypercholesterolemia    On pravastatin.  Low cholesterol diet and exercise.  Follow lipid panel and liver function tests.  Discussed recent labs.  Will increase pravastatin to 63m q day.   Lab Results  Component Value Date   CHOL 175 06/28/2017   HDL 55.90 06/28/2017   LDLCALC 101 (H) 06/28/2017   LDLDIRECT 148.4 11/03/2012   TRIG 88.0 06/28/2017   CHOLHDL 3 06/28/2017        Relevant Medications   pravastatin (PRAVACHOL) 20 MG tablet   Other Relevant Orders   Hepatic function panel   Lipid panel   Hyperglycemia    Low carb diet and exercise.  Follow met b and a1c.        Relevant Orders   Hemoglobin A1c   Right arm pain    Some persistent pain.  S/p neck surgery.  Has f/u planned with neurosurgery.         Other Visit Diagnoses    Breast cancer screening    -  Primary   Relevant Orders   MM 3D SCREEN BREAST BILATERAL   Leukocytosis, unspecified type       Relevant Orders   CBC with Differential/Platelet       SEinar Pheasant MD

## 2017-06-30 NOTE — Patient Instructions (Signed)
Change pravastatin 10 mg per day to 20mg  per day.

## 2017-07-01 ENCOUNTER — Other Ambulatory Visit: Payer: Self-pay | Admitting: Internal Medicine

## 2017-07-03 ENCOUNTER — Encounter: Payer: Self-pay | Admitting: Internal Medicine

## 2017-07-03 NOTE — Assessment & Plan Note (Signed)
Low carb diet and exercise.  Follow met b and a1c.   

## 2017-07-03 NOTE — Assessment & Plan Note (Addendum)
On pravastatin.  Low cholesterol diet and exercise.  Follow lipid panel and liver function tests.  Discussed recent labs.  Will increase pravastatin to 20mg  q day.   Lab Results  Component Value Date   CHOL 175 06/28/2017   HDL 55.90 06/28/2017   LDLCALC 101 (H) 06/28/2017   LDLDIRECT 148.4 11/03/2012   TRIG 88.0 06/28/2017   CHOLHDL 3 06/28/2017

## 2017-07-03 NOTE — Assessment & Plan Note (Signed)
On zantac.  Planning for f/u EGD.

## 2017-07-03 NOTE — Assessment & Plan Note (Signed)
Some persistent pain.  S/p neck surgery.  Has f/u planned with neurosurgery.

## 2017-07-03 NOTE — Assessment & Plan Note (Signed)
Blood pressure under good control.  Continue same medication regimen.  Follow pressures.  Follow metabolic panel.   

## 2017-07-03 NOTE — Assessment & Plan Note (Signed)
Breathing stable.  Doing well.  Follow.

## 2017-07-03 NOTE — Assessment & Plan Note (Signed)
Has been followed by pulmonary.  Breathing stable.  

## 2017-07-08 ENCOUNTER — Other Ambulatory Visit: Payer: Self-pay | Admitting: Internal Medicine

## 2017-07-27 ENCOUNTER — Encounter (HOSPITAL_COMMUNITY): Payer: Self-pay | Admitting: Neurosurgery

## 2017-08-23 ENCOUNTER — Other Ambulatory Visit: Payer: Self-pay | Admitting: *Deleted

## 2017-08-23 MED ORDER — CITALOPRAM HYDROBROMIDE 40 MG PO TABS: ORAL_TABLET | ORAL | 0 refills | Status: DC

## 2017-08-23 MED ORDER — RANITIDINE HCL 150 MG PO TABS: 150.0000 mg | ORAL_TABLET | Freq: Two times a day (BID) | ORAL | 5 refills | Status: DC

## 2017-09-08 ENCOUNTER — Ambulatory Visit
Admission: RE | Admit: 2017-09-08 | Discharge: 2017-09-08 | Disposition: A | Discharge status: Home / Self Care | Payer: Medicare PPO | Source: Ambulatory Visit | Attending: Internal Medicine | Admitting: Internal Medicine

## 2017-09-08 DIAGNOSIS — Z1239 Encounter for other screening for malignant neoplasm of breast: Secondary | ICD-10-CM

## 2017-09-08 DIAGNOSIS — Z1231 Encounter for screening mammogram for malignant neoplasm of breast: Secondary | ICD-10-CM | POA: Diagnosis present

## 2017-09-13 ENCOUNTER — Other Ambulatory Visit: Payer: Self-pay | Admitting: Internal Medicine

## 2017-10-21 DIAGNOSIS — G5621 Lesion of ulnar nerve, right upper limb: Secondary | ICD-10-CM | POA: Insufficient documentation

## 2017-10-29 ENCOUNTER — Other Ambulatory Visit: Payer: Self-pay | Admitting: Internal Medicine

## 2017-10-30 DIAGNOSIS — G5601 Carpal tunnel syndrome, right upper limb: Secondary | ICD-10-CM | POA: Insufficient documentation

## 2017-11-11 ENCOUNTER — Other Ambulatory Visit: Payer: Medicare PPO

## 2017-11-15 ENCOUNTER — Ambulatory Visit: Payer: Self-pay

## 2017-11-15 ENCOUNTER — Ambulatory Visit (INDEPENDENT_AMBULATORY_CARE_PROVIDER_SITE_OTHER): Payer: Medicare PPO

## 2017-11-15 ENCOUNTER — Ambulatory Visit (INDEPENDENT_AMBULATORY_CARE_PROVIDER_SITE_OTHER): Payer: Medicare PPO | Admitting: Family Medicine

## 2017-11-15 ENCOUNTER — Encounter: Payer: Self-pay | Admitting: Family Medicine

## 2017-11-15 VITALS — BP 120/76 | HR 79 | Temp 98.7°F | Ht 61.0 in | Wt 149.4 lb

## 2017-11-15 DIAGNOSIS — J4521 Mild intermittent asthma with (acute) exacerbation: Secondary | ICD-10-CM

## 2017-11-15 DIAGNOSIS — M25562 Pain in left knee: Secondary | ICD-10-CM | POA: Diagnosis not present

## 2017-11-15 DIAGNOSIS — J984 Other disorders of lung: Secondary | ICD-10-CM

## 2017-11-15 MED ORDER — PREDNISONE 10 MG (21) PO TBPK
ORAL_TABLET | ORAL | 0 refills | Status: DC
Start: 2017-11-15 — End: 2017-11-26

## 2017-11-15 MED ORDER — IPRATROPIUM BROMIDE 0.02 % IN SOLN: 0.5000 mg | Freq: Four times a day (QID) | RESPIRATORY_TRACT | 1 refills | Status: DC | PRN

## 2017-11-15 NOTE — Telephone Encounter (Signed)
Incoming call from Patient with complaint of "flare up of COPD and Asthma"  Main sx is deep raspy cough.  Patient states she takes a nebulizer treatment 2 times per day.  Already has had a treatment this am.  The onset was this past Friday.  Patient does not use home oxygen therapy.  Does not own a pulse oximeter.  Patient is not able to assess her vital signs. Denies difficulty breathing breathing. Patient does report congestion and coughing.  Coughing up brownish phlegm. Patient states "Dr.  Nicki Reaper has told her to call the office when these flare ups occur so that she can come in for a prednisone treatment."  When reviewing Dr.  Nicki Reaper schedule, no availability.  Telephone call to office.  Requested patient be worked in .  Armed forces operational officer states that she will discuss with Dr.  Nicki Reaper. and call the patient back.  Related info to Patient.  Patient voiced understanding.   Reason for Disposition . Coughing up rusty-colored (reddish-brown) sputum  Answer Assessment - Initial Assessment Questions 1. MAIN CONCERN OR SYMPTOM : "What's your main concern?" (e.g., low oxygen level, breathing difficulty) "What question do you have?"     No  Got up early took a breathing tx. 2. ONSET: "When did the  *No Answer*  start?"      Friday3. OXYGEN THERAPY:    - "Do you currently use home oxygen?" (e.g., yes, no).    - If yes, "What is your oxygen source?" (e.g., O2 tank, O2 concentrator).    - If yes, "How do you get the oxygen?" (e.g., nasal prongs, face mask).    - If yes, "How much oxygen are you supposed to use?" (e.g., 1-2 L [place])     *No Answer* 4. PULSE OXIMETER:    - "Do you have a pulse oximeter (pulse ox)?"  (e.g., yes, no)    - If yes, "Where do you place the probe?" (e.g., fingertip, ear lobe)     *No Answer* 5. O2 MONITORING: "What is the oxygen level (pulse ox reading)?" (e.g., 70-100%) 6: VSS MONITORING "Do you monitor/measure your oxygen level or vital signs (e.g., yes, no, measurements are automatically  sent to call center). Document CURRENT and NORMAL BASELINE values if available.     -  O2 SAT: "What is the oxygen level (pulse ox reading)?" (e.g., 70-100%)   -  P: "What is your pulse rate per minute?"   -  RR: "What is your respiratory rate per minute?"     *No Answer* 7. BREATHING DIFFICULTY: "Are you having any difficulty breathing?" If so, ask "How bad is it?"  (e.g., none, mild, moderate, severe)   - MILD: No SOB at rest, mild SOB with walking, speaks normally in sentences, able to lie down, no retractions, pulse < 100.   - MODERATE: SOB at rest, SOB with minimal exertion and prefers to sit, cannot lie down flat, speaks in phrases, mild retractions, audible wheezing, pulse 100-120.   - SEVERE: Very SOB at rest, speaks in single words, struggling to breathe, sitting hunched forward, retractions, pulse > 120      denies 8. OTHER SYMPTOMS: "Do you have any other symptoms?" (e.g., fever, change in sputum)    Cough congested phlegm brownisis 9. SMOKING: "Do you smoke currently?" (Note: smoking around oxygen is dangerous!)     no  Protocols used: COPD OXYGEN MONITORING AND HYPOXIA-A-AH

## 2017-11-15 NOTE — Progress Notes (Signed)
Subjective:    [name], female    DOB: 1945/11/25, 72 y.o.   MRN: [id number]  HPI   Patient presents to clinic complaining of increased cough with phlegm, wheezing and shortness of breath over the past 2 days.  Patient has long-standing history of chronic restrictive lung disease and asthma, states that she gets exacerbations 1 or 2 times a year.  Patient has been using her nebulizer at home which has been somewhat helpful.  States she does need a refill of her nebulizer solution.  Denies any fever or chills.  Denies any nausea, vomiting or diarrhea.  Denies any chest pain or palpitations.  Patient also complains of left knee pain for the past few weeks.  Denies any known injury.  She has been wearing soft knee brace that has been somewhat helpful to relieve pain.  States she notices the pain the most in her knee when she tries to cross her legs and only has to twist somewhat to get to crossed position.  Patient Active Problem List   Diagnosis Date Noted  . Radiculopathy of cervical spine 04/20/2017  . 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 Ear pain, left 03/27/2017  . Right arm pain 11/12/2016  . Vertigo 09/02/2016  . Left low back pain 05/22/2015  . Left hip pain 05/22/2015  . SOB (shortness of breath) 11/11/2014  . Right shoulder pain 07/16/2014  . Health care maintenance 06/03/2014  . Arm skin lesion, left 06/18/2013  . URI (upper respiratory infection) 03/03/2013  . Hyperglycemia 11/12/2012  . Hypercholesterolemia 11/12/2012  . Essential hypertension, benign 05/12/2012  . GERD (gastroesophageal reflux disease) 05/12/2012  . Gastritis 05/12/2012  . History of colonic polyps 05/12/2012  . Asthma 12/13/2011  . Chronic restrictive lung disease 05/11/2011   Social History   Tobacco Use  . Smoking status: Never Smoker  . Smokeless tobacco: Never Used  Substance Use Topics  . Alcohol use: No    Alcohol/week: 0.0 standard drinks   Review of  Systems  Constitutional: Negative for chills, fatigue and fever.  HENT: Negative for congestion, ear pain, sinus pain and sore throat.   Eyes: Negative.   Respiratory: +cough, shortness of breath and wheezing.   Cardiovascular: Negative for chest pain, palpitations and leg swelling.  Gastrointestinal: Negative for abdominal pain, diarrhea, nausea and vomiting.  Genitourinary: Negative for dysuria, frequency and urgency.  Musculoskeletal: Negative for arthralgias and myalgias.  Skin: Negative for color change, pallor and rash.  Neurological: Negative for syncope, light-headedness and headaches.  Psychiatric/Behavioral: The patient is not nervous/anxious.    Objective:   Physical Exam  Constitutional: She is oriented to person, place, and time. She appears well-nourished. No distress.  HENT:  Head: Normocephalic and atraumatic.  Eyes: EOM are normal. No scleral icterus.  Neck: Neck supple. No tracheal deviation present.  Cardiovascular: Normal rate and regular rhythm.  Pulmonary/Chest: Effort normal. No stridor. No respiratory distress. She has wheezes. She has no rales.  Wheezes & scattered rhonchi. Cough is harsh and raspy.   Musculoskeletal: She exhibits no edema.       Left knee: Tenderness found.  Tenderness of left knee at extreme limits of range. Negative anterior or posterior drawer test. Pain in knee when trying to cross legs when knee has to twist some to be in crossed position.   Neurological: She is alert and oriented to person, place, and time.  Skin: Skin is warm and dry. She is not diaphoretic. No pallor.  Psychiatric:  She has a normal mood and affect. Her behavior is normal.  Nursing note and vitals reviewed.     Vitals:   11/15/17 1031  BP: 120/76  Pulse: 79  Temp: 98.7 F (37.1 C)  SpO2: 98%   Assessment & Plan:    Chronic restrictive lung disease/mild intermittent asthma with acute exacerbation - patient will continue doing nebulizer treatments, refill of  ipratropium sent to pharmacy.  She will take prednisone taper.  Patient advised that if symptoms do not improve over the next couple of days, she should return to clinic for chest x-ray at that time.  Pain of left knee - we will get x-ray of the knee to further evaluate.  Suspect pain is related to osteoarthritis.  Patient advised he can continue to wear soft knee brace to give joint some support, also advised to elevate whenever sitting and can try a topical rub such as BenGay or Biofreeze for pain relief.  Patient advised she can also use Tylenol as needed for pain relief  Keep regularly scheduled follow-up PCP.  Return to clinic sooner if issues persist or worsen or new issues arise.

## 2017-11-16 ENCOUNTER — Telehealth: Payer: Self-pay | Admitting: Internal Medicine

## 2017-11-16 DIAGNOSIS — M25562 Pain in left knee: Secondary | ICD-10-CM

## 2017-11-16 NOTE — Telephone Encounter (Signed)
Copied from [place] [id number]. Topic: General - Other >> Nov 16, 2017 12:19 PM Janace Aris A wrote: Reason for CRM: pt called in she is inquiring on her imaging results for her knee. She says she would like a call back regarding this. Also pt says a detailed VM will be fine as well if she does not answer.   Please advise

## 2017-11-17 ENCOUNTER — Other Ambulatory Visit (INDEPENDENT_AMBULATORY_CARE_PROVIDER_SITE_OTHER): Payer: Medicare PPO

## 2017-11-17 DIAGNOSIS — R739 Hyperglycemia, unspecified: Secondary | ICD-10-CM | POA: Diagnosis not present

## 2017-11-17 DIAGNOSIS — E7800 Pure hypercholesterolemia, unspecified: Secondary | ICD-10-CM

## 2017-11-17 DIAGNOSIS — I10 Essential (primary) hypertension: Secondary | ICD-10-CM | POA: Diagnosis not present

## 2017-11-17 DIAGNOSIS — D72829 Elevated white blood cell count, unspecified: Secondary | ICD-10-CM | POA: Diagnosis not present

## 2017-11-17 LAB — LIPID PANEL
CHOL/HDL RATIO: 2
Cholesterol: 168 mg/dL (ref 0–200)
HDL: 73.6 mg/dL (ref >39.00)
LDL CALC: 82 mg/dL (ref 0–99)
NonHDL: 94.66
Triglycerides: 61 mg/dL (ref 0.0–149.0)
VLDL: 12.2 mg/dL (ref 0.0–40.0)

## 2017-11-17 LAB — HEPATIC FUNCTION PANEL
ALT: 11 U/L (ref 0–35)
AST: 16 U/L (ref 0–37)
Albumin: 4.3 g/dL (ref 3.5–5.2)
Alkaline Phosphatase: 51 U/L (ref 39–117)
BILIRUBIN TOTAL: 0.4 mg/dL (ref 0.2–1.2)
Bilirubin, Direct: 0.1 mg/dL (ref 0.0–0.3)
Total Protein: 6.8 g/dL (ref 6.0–8.3)

## 2017-11-17 LAB — CBC WITH DIFFERENTIAL/PLATELET
BASOS ABS: 0 10*3/uL (ref 0.0–0.1)
Basophils Relative: 0.3 % (ref 0.0–3.0)
Eosinophils Absolute: 0 10*3/uL (ref 0.0–0.7)
Eosinophils Relative: 0.2 % (ref 0.0–5.0)
HEMATOCRIT: 41.6 % (ref 36.0–46.0)
HEMOGLOBIN: 14 g/dL (ref 12.0–15.0)
LYMPHS ABS: 2.7 10*3/uL (ref 0.7–4.0)
LYMPHS PCT: 19.7 % (ref 12.0–46.0)
MCHC: 33.6 g/dL (ref 30.0–36.0)
MCV: 93 fl (ref 78.0–100.0)
Monocytes Absolute: 0.7 10*3/uL (ref 0.1–1.0)
Monocytes Relative: 5 % (ref 3.0–12.0)
NEUTROS PCT: 74.8 % (ref 43.0–77.0)
Neutro Abs: 10.3 10*3/uL — ABNORMAL HIGH (ref 1.4–7.7)
Platelets: 270 10*3/uL (ref 150.0–400.0)
RBC: 4.47 Mil/uL (ref 3.87–5.11)
RDW: 13.5 % (ref 11.5–15.5)
WBC: 13.7 10*3/uL — AB (ref 4.0–10.5)

## 2017-11-17 LAB — TSH: TSH: 0.39 u[IU]/mL (ref 0.35–4.50)

## 2017-11-17 LAB — BASIC METABOLIC PANEL
BUN: 11 mg/dL (ref 6–23)
CALCIUM: 9.8 mg/dL (ref 8.4–10.5)
CO2: 33 mEq/L — ABNORMAL HIGH (ref 19–32)
CREATININE: 0.62 mg/dL (ref 0.40–1.20)
Chloride: 100 mEq/L (ref 96–112)
GFR: 100.91 mL/min (ref >60.00)
Glucose, Bld: 99 mg/dL (ref 70–99)
Potassium: 3.5 mEq/L (ref 3.5–5.1)
Sodium: 140 mEq/L (ref 135–145)

## 2017-11-17 LAB — HEMOGLOBIN A1C: HEMOGLOBIN A1C: 5.7 % (ref 4.6–6.5)

## 2017-11-23 NOTE — Telephone Encounter (Signed)
Patient saw Lauren for knee pain. OK to place referral to ortho. I will give info to [facility] if needed.

## 2017-11-23 NOTE — Telephone Encounter (Signed)
Pt is still having knee pain and would like to know what to do. Pt has tried hot and ice . Pt is no longer taking prednisone that did help with her knee. Pt would like to know if she needs a referral to orthopedic doctor if so she would like to go to [facility]

## 2017-11-24 ENCOUNTER — Telehealth: Payer: Self-pay

## 2017-11-24 NOTE — Telephone Encounter (Signed)
Copied from [place] [id number]. Topic: Appointment Scheduling - Scheduling Inquiry for Clinic >> Nov 24, 2017  3:22 PM Margot Ables wrote: Reason for CRM: pt called to [facility] due to seeing ortho for her knee 12/07/17. Next available 03/15/17 with Dr. Nicki Reaper. Pt wants to make sure ok to move out that far.  Please advise?

## 2017-11-24 NOTE — Telephone Encounter (Signed)
Pt is aware and says she has an appt with [facility] today from prior surgery, didn't know if they would be able to see her about her knee but she is going to ask.

## 2017-11-24 NOTE — Telephone Encounter (Signed)
Order placed for ortho referral.  Please notify pt that someone should be contacting her with appt date and time.

## 2017-11-25 NOTE — Telephone Encounter (Signed)
Pt scheduled for tomorrow at 11

## 2017-11-26 ENCOUNTER — Ambulatory Visit (INDEPENDENT_AMBULATORY_CARE_PROVIDER_SITE_OTHER): Payer: Medicare PPO | Admitting: Internal Medicine

## 2017-11-26 ENCOUNTER — Encounter: Payer: Self-pay | Admitting: Internal Medicine

## 2017-11-26 VITALS — BP 128/74 | HR 64 | Temp 98.0°F | Resp 18 | Ht 61.0 in | Wt 149.6 lb

## 2017-11-26 DIAGNOSIS — I10 Essential (primary) hypertension: Secondary | ICD-10-CM

## 2017-11-26 DIAGNOSIS — K2100 Gastro-esophageal reflux disease with esophagitis, without bleeding: Secondary | ICD-10-CM

## 2017-11-26 DIAGNOSIS — D72829 Elevated white blood cell count, unspecified: Secondary | ICD-10-CM

## 2017-11-26 DIAGNOSIS — J984 Other disorders of lung: Secondary | ICD-10-CM | POA: Diagnosis not present

## 2017-11-26 DIAGNOSIS — Z0000 Encounter for general adult medical examination without abnormal findings: Secondary | ICD-10-CM

## 2017-11-26 DIAGNOSIS — J4520 Mild intermittent asthma, uncomplicated: Secondary | ICD-10-CM | POA: Diagnosis not present

## 2017-11-26 DIAGNOSIS — R739 Hyperglycemia, unspecified: Secondary | ICD-10-CM

## 2017-11-26 DIAGNOSIS — E7800 Pure hypercholesterolemia, unspecified: Secondary | ICD-10-CM

## 2017-11-26 LAB — CBC WITH DIFFERENTIAL/PLATELET
BASOS ABS: 0.1 10*3/uL (ref 0.0–0.1)
BASOS PCT: 1 % (ref 0.0–3.0)
EOS ABS: 0.5 10*3/uL (ref 0.0–0.7)
Eosinophils Relative: 4.8 % (ref 0.0–5.0)
HCT: 43.2 % (ref 36.0–46.0)
HEMOGLOBIN: 14.4 g/dL (ref 12.0–15.0)
Lymphocytes Relative: 28.1 % (ref 12.0–46.0)
Lymphs Abs: 2.7 10*3/uL (ref 0.7–4.0)
MCHC: 33.4 g/dL (ref 30.0–36.0)
MCV: 93.2 fl (ref 78.0–100.0)
MONO ABS: 0.8 10*3/uL (ref 0.1–1.0)
Monocytes Relative: 8.6 % (ref 3.0–12.0)
Neutro Abs: 5.5 10*3/uL (ref 1.4–7.7)
Neutrophils Relative %: 57.5 % (ref 43.0–77.0)
PLATELETS: 261 10*3/uL (ref 150.0–400.0)
RBC: 4.63 Mil/uL (ref 3.87–5.11)
RDW: 13.1 % (ref 11.5–15.5)
WBC: 9.5 10*3/uL (ref 4.0–10.5)

## 2017-11-26 NOTE — Assessment & Plan Note (Signed)
Physical today 11/26/17.  Colonoscopy 02/09/10.  Mammogram 09/09/17 - Birads I.

## 2017-11-26 NOTE — Progress Notes (Signed)
[name], female   DOB: 14-Feb-1945, 72 y.o.   MRN: [id number]   Subjective:    [name], female    DOB: [birth year], 72 y.o.   MRN: [id number]  HPI  Patient here for her physical exam.  Was evaluated 11/15/17 for an acute visit by Philis Nettle.  Diagnosed with acute asthma exacerbation.  Treated with steroids.   Continued inhalers.  States she is doing better.  Decreased cough and congestion.  Was previously using neb 2x/day.  No chest pain.  No acid reflux.  Has f/u EGD 12/08/17.  Having some pain in her left knee.  Has been wearing a soft brace.  Previously instructed to use topical pain relief and tylenol. Xray revealed arthritis changes.  Has f/u with ortho 12/07/17.  States otherwise doing well.  No abdominal pain.  Bowels moving.     Past Medical History:  Diagnosis Date  . Anxiety    panic attacks  . Arthritis   . Asthma   . Bronchitis   . Depression   . 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 Gastritis   . GERD (gastroesophageal reflux disease)   . Hypertension   . PONV (postoperative nausea and vomiting)   . Scoliosis   . Ulcer    Past Surgical History:  Procedure Laterality Date  . ABDOMINAL HYSTERECTOMY    . ANTERIOR CERVICAL DECOMP/DISCECTOMY FUSION N/A 04/19/2017   Procedure: Anterior Cervical Decompression Fusion Cervical Three-Four, Cervical Four-Five, Cervical Five-Six;  Surgeon: Kary Kos, MD;  Location: [facility];  Service: Neurosurgery;  Laterality: N/A;  Anterior Cervical Decompression Fusion Cervical Three-Four, Cervical Four-Five, Cervical Five-Six  . CHOLECYSTECTOMY  1996  . DILATION AND CURETTAGE OF UTERUS  1971  . LAPAROSCOPIC REMOVAL OF MESENTERIC MASS    . [place]  . PARTIAL HYSTERECTOMY  1981   prolapsed uterus  . TUBAL LIGATION  1978   Family History  Problem Relation Age of Onset  . Stroke Mother   . Cancer Mother        Brain   . CVA Maternal Grandmother   . Cancer Father     . Breast cancer Neg Hx   . Colon cancer Neg Hx    Social History   Socioeconomic History  . Marital status: Married    Spouse name: Not on file  . Number of children: 2  . Years of education: Not on file  . Highest education level: Not on file  Occupational History  . Occupation: retired Tour manager  . Financial resource strain: Not on file  . Food insecurity:    Worry: Not on file    Inability: Not on file  . Transportation needs:    Medical: Not on file    Non-medical: Not on file  Tobacco Use  . Smoking status: Never Smoker  . Smokeless tobacco: Never Used  Substance and Sexual Activity  . Alcohol use: No    Alcohol/week: 0.0 standard drinks  . Drug use: No  . Sexual activity: Not on file  Lifestyle  . Physical activity:    Days per week: Not on file    Minutes per session: Not on file  . Stress: Not on file  Relationships  . Social connections:    Talks on phone: Not on file    Gets together: Not on file    Attends religious service: Not on file  Active member of club or organization: Not on file    Attends meetings of clubs or organizations: Not on file    Relationship status: Not on file  Other Topics Concern  . Not on file  Social History Narrative  . Not on file    Outpatient Encounter Medications as of 11/26/2017  Medication Sig  . citalopram (CELEXA) 40 MG tablet Take 1 tablet (40 mg total) by mouth daily.  . clonazePAM (KLONOPIN) 0.5 MG tablet TAKE ONE-HALF TO ONE TABLET AS NEEDED.  . fluticasone (FLONASE) 50 MCG/ACT nasal spray Place 2 sprays into the nose daily as needed for rhinitis.   . hydrochlorothiazide (HYDRODIURIL) 25 MG tablet Take 1 tablet (25 mg total) by mouth daily.  Marland Kitchen ibuprofen (ADVIL,MOTRIN) 200 MG tablet Take 200 mg by mouth 2 (two) times daily as needed for moderate pain.  Marland Kitchen ipratropium (ATROVENT) 0.02 % nebulizer solution Take 2.5 mLs (0.5 mg total) by nebulization every 6 (six) hours as needed for shortness of breath.   . levalbuterol (XOPENEX HFA) 45 MCG/ACT inhaler Inhale 1-2 puffs into the lungs every 6 (six) hours as needed for wheezing or shortness of breath.   . Multiple Vitamin (MULTIVITAMIN WITH MINERALS) TABS tablet Take 1 tablet by mouth daily.  . mupirocin ointment (BACTROBAN) 2 % Apply to affected area bid (Patient taking differently: Apply 1 application topically daily as needed (irritation). )  . pravastatin (PRAVACHOL) 20 MG tablet Take 1 tablet (20 mg total) by mouth daily.  . Probiotic Product (PROBIOTIC DAILY PO) Take 1 capsule by mouth daily.   . ranitidine (ZANTAC) 150 MG tablet Take 1 tablet (150 mg total) by mouth 2 (two) times daily.  Marland Kitchen tiotropium (SPIRIVA HANDIHALER) 18 MCG inhalation capsule Place 1 capsule (18 mcg total) into inhaler and inhale daily.  . [DISCONTINUED] predniSONE (STERAPRED UNI-PAK 21 TAB) 10 MG (21) TBPK tablet Take according to pack instructions   No facility-administered encounter medications on file as of 11/26/2017.     Review of Systems  Constitutional: Negative for appetite change and unexpected weight change.  HENT: Negative for congestion and sinus pressure.   Respiratory: Positive for cough. Negative for chest tightness and shortness of breath.   Cardiovascular: Negative for chest pain, palpitations and leg swelling.  Gastrointestinal: Negative for abdominal pain, diarrhea, nausea and vomiting.  Genitourinary: Negative for difficulty urinating and dysuria.  Musculoskeletal: Negative for myalgias.       Persistent knee pain as outlined.    Skin: Negative for color change and rash.  Neurological: Negative for dizziness, light-headedness and headaches.  Psychiatric/Behavioral: Negative for agitation and dysphoric mood.       Objective:     Blood pressure rechecked by me:  118/78  Physical Exam  Constitutional: She is oriented to person, place, and time. She appears well-developed and well-nourished. No distress.  HENT:  Nose: Nose normal.   Mouth/Throat: Oropharynx is clear and moist.  Eyes: Right eye exhibits no discharge. Left eye exhibits no discharge. No scleral icterus.  Neck: Neck supple. No thyromegaly present.  Cardiovascular: Normal rate and regular rhythm.  Pulmonary/Chest: Breath sounds normal. No accessory muscle usage. No tachypnea. No respiratory distress. She has no decreased breath sounds. She has no wheezes. She has no rhonchi. Right breast exhibits no inverted nipple, no mass, no nipple discharge and no tenderness (no axillary adenopathy). Left breast exhibits no inverted nipple, no mass, no nipple discharge and no tenderness (no axilarry adenopathy).  Abdominal: Soft. Bowel sounds are normal. There is no  tenderness.  Musculoskeletal: She exhibits no edema or tenderness.  Lymphadenopathy:    She has no cervical adenopathy.  Neurological: She is alert and oriented to person, place, and time.  Skin: No rash noted. No erythema.  Psychiatric: She has a normal mood and affect. Her behavior is normal.    BP 128/74 (BP Location: Left Arm, Patient Position: Sitting, Cuff Size: Normal)   Pulse 64   Temp 98 F (36.7 C) (Oral)   Resp 18   Ht _0  (1.549 m)   Wt 149 lb 9.6 oz (67.9 kg)   SpO2 98%   BMI 28.27 kg/m  Wt Readings from Last 3 Encounters:  11/26/17 149 lb 9.6 oz (67.9 kg)  11/15/17 149 lb 6.4 oz (67.8 kg)  06/30/17 153 lb 9.6 oz (69.7 kg)     Lab Results  Component Value Date   WBC 9.5 11/26/2017   HGB 14.4 11/26/2017   HCT 43.2 11/26/2017   PLT 261.0 11/26/2017   GLUCOSE 99 11/17/2017   CHOL 168 11/17/2017   TRIG 61.0 11/17/2017   HDL 73.60 11/17/2017   LDLDIRECT 148.4 11/03/2012   LDLCALC 82 11/17/2017   ALT 11 11/17/2017   AST 16 11/17/2017   NA 140 11/17/2017   K 3.5 11/17/2017   CL 100 11/17/2017   CREATININE 0.62 11/17/2017   BUN 11 11/17/2017   CO2 33 (H) 11/17/2017   TSH 0.39 11/17/2017   HGBA1C 5.7 11/17/2017    Mm 3d Screen Breast Bilateral  Result Date:  09/09/2017 CLINICAL DATA:  Screening. EXAM: DIGITAL SCREENING BILATERAL MAMMOGRAM WITH TOMO AND CAD COMPARISON:  Previous exam(s). ACR Breast Density Category b: There are scattered areas of fibroglandular density. FINDINGS: There are no findings suspicious for malignancy. Images were processed with CAD. IMPRESSION: No mammographic evidence of malignancy. A result letter of this screening mammogram will be mailed directly to the patient. RECOMMENDATION: Screening mammogram in one year. (Code:SM-B-01Y) BI-RADS CATEGORY  1: Negative. Electronically Signed   By: Fidela Salisbury M.D.   On: 09/09/2017 09:04       Assessment & Plan:   Problem List Items Addressed This Visit    Asthma    Recent exacerbation.  Treated with steroids.  Doing better.  Continue inhalers.  Follow.        Chronic restrictive lung disease    Has been followed by pulmonary.  Treated recently for exacerbation.  Doing better.  Follow.        Essential hypertension, benign    Blood pressure under good control.  Continue same medication regimen.  Follow pressures.  Follow metabolic panel.        Relevant Orders   Basic metabolic panel   GERD (gastroesophageal reflux disease)    Symptoms controlled.  Has f/u EGD 12/08/17.        Health care maintenance    Physical today 11/26/17.  Colonoscopy 02/09/10.  Mammogram 09/09/17 - Birads I.        Hypercholesterolemia    On pravastatin.  Low cholesterol diet and exercise.  Follow lipid panel and liver function tests.        Relevant Orders   Hepatic function panel   Lipid panel   Hyperglycemia    Low carb diet and exercise.  Follow met b and a1c.        Relevant Orders   Hemoglobin A1c    Other Visit Diagnoses    Routine general medical examination at a health care facility    -  Primary   Leukocytosis, unspecified type       Relevant Orders   CBC with Differential/Platelet (Completed)       [name], MD

## 2017-11-28 ENCOUNTER — Encounter: Payer: Self-pay | Admitting: Internal Medicine

## 2017-11-28 NOTE — Assessment & Plan Note (Signed)
Has been followed by pulmonary.  Treated recently for exacerbation.  Doing better.  Follow.

## 2017-11-28 NOTE — Assessment & Plan Note (Signed)
Low carb diet and exercise.  Follow met b and a1c.   

## 2017-11-28 NOTE — Assessment & Plan Note (Signed)
Blood pressure under good control.  Continue same medication regimen.  Follow pressures.  Follow metabolic panel.   

## 2017-11-28 NOTE — Assessment & Plan Note (Signed)
Symptoms controlled.  Has f/u EGD 12/08/17.

## 2017-11-28 NOTE — Assessment & Plan Note (Signed)
On pravastatin.  Low cholesterol diet and exercise.  Follow lipid panel and liver function tests.   

## 2017-11-28 NOTE — Assessment & Plan Note (Signed)
Recent exacerbation.  Treated with steroids.  Doing better.  Continue inhalers.  Follow.

## 2017-11-29 ENCOUNTER — Other Ambulatory Visit: Payer: Self-pay | Admitting: Internal Medicine

## 2017-12-02 ENCOUNTER — Other Ambulatory Visit (INDEPENDENT_AMBULATORY_CARE_PROVIDER_SITE_OTHER): Payer: Medicare PPO

## 2017-12-02 ENCOUNTER — Encounter: Payer: Medicare PPO | Admitting: Internal Medicine

## 2017-12-02 ENCOUNTER — Telehealth: Payer: Self-pay | Admitting: *Deleted

## 2017-12-02 DIAGNOSIS — Z1211 Encounter for screening for malignant neoplasm of colon: Secondary | ICD-10-CM

## 2017-12-02 LAB — FECAL OCCULT BLOOD, IMMUNOCHEMICAL: FECAL OCCULT BLD: NEGATIVE

## 2017-12-02 NOTE — Telephone Encounter (Signed)
Pt dropped of ifob. No order found   Order placed

## 2017-12-07 ENCOUNTER — Encounter: Payer: Medicare PPO | Admitting: Internal Medicine

## 2017-12-09 ENCOUNTER — Other Ambulatory Visit: Payer: Self-pay | Admitting: Internal Medicine

## 2017-12-15 ENCOUNTER — Other Ambulatory Visit: Payer: Self-pay | Admitting: Internal Medicine

## 2017-12-18 NOTE — Telephone Encounter (Signed)
rx ok'd for clonazepam #30 with 1 refill.  

## 2017-12-20 ENCOUNTER — Ambulatory Visit (INDEPENDENT_AMBULATORY_CARE_PROVIDER_SITE_OTHER): Payer: Medicare PPO | Admitting: *Deleted

## 2017-12-20 DIAGNOSIS — Z23 Encounter for immunization: Secondary | ICD-10-CM | POA: Diagnosis not present

## 2017-12-31 ENCOUNTER — Other Ambulatory Visit: Payer: Self-pay | Admitting: Internal Medicine

## 2018-01-03 ENCOUNTER — Telehealth: Payer: Self-pay

## 2018-01-03 NOTE — Telephone Encounter (Signed)
Copied from [place] [phone]. Topic: Quick Communication - See Telephone Encounter >> Jan 03, 2018 10:13 AM Marja Kays F wrote: Pt is calling to say that when she went to pharmacy to get her ranitidine refilled they told her that it has been taken off the market and she is wanting to know what she can use now  For her stomach issues   Best number [facility] in [place]

## 2018-01-04 NOTE — Telephone Encounter (Signed)
Patient aware and will switch to pepcid 20 mg

## 2018-01-04 NOTE — Telephone Encounter (Signed)
pepcid 20mg  with same instructions

## 2018-01-04 NOTE — Telephone Encounter (Signed)
What would you like for her to switch to?

## 2018-01-18 DIAGNOSIS — M1712 Unilateral primary osteoarthritis, left knee: Secondary | ICD-10-CM | POA: Insufficient documentation

## 2018-01-27 DIAGNOSIS — M461 Sacroiliitis, not elsewhere classified: Secondary | ICD-10-CM | POA: Diagnosis not present

## 2018-01-28 ENCOUNTER — Other Ambulatory Visit: Payer: Self-pay | Admitting: Student

## 2018-01-28 DIAGNOSIS — M461 Sacroiliitis, not elsewhere classified: Secondary | ICD-10-CM

## 2018-02-01 ENCOUNTER — Other Ambulatory Visit: Payer: Self-pay | Admitting: Internal Medicine

## 2018-02-04 ENCOUNTER — Ambulatory Visit
Admission: RE | Admit: 2018-02-04 | Discharge: 2018-02-04 | Disposition: A | Discharge status: Home / Self Care | Payer: Medicare Other | Source: Ambulatory Visit | Attending: Student | Admitting: Student

## 2018-02-04 DIAGNOSIS — M533 Sacrococcygeal disorders, not elsewhere classified: Secondary | ICD-10-CM | POA: Diagnosis not present

## 2018-02-04 DIAGNOSIS — M461 Sacroiliitis, not elsewhere classified: Secondary | ICD-10-CM

## 2018-02-04 DIAGNOSIS — M545 Low back pain: Secondary | ICD-10-CM | POA: Diagnosis not present

## 2018-02-04 MED ORDER — METHYLPREDNISOLONE ACETATE 40 MG/ML INJ SUSP (RADIOLOG
120.0000 mg | Freq: Once | INTRAMUSCULAR | Status: AC
  Administered 2018-02-04: 120 mg via INTRA_ARTICULAR

## 2018-02-04 MED ORDER — IOPAMIDOL (ISOVUE-M 200) INJECTION 41%
1.0000 mL | Freq: Once | INTRAMUSCULAR | Status: AC
  Administered 2018-02-04: 1 mL via INTRA_ARTICULAR

## 2018-02-05 ENCOUNTER — Other Ambulatory Visit: Payer: Self-pay | Admitting: Internal Medicine

## 2018-02-17 DIAGNOSIS — M5440 Lumbago with sciatica, unspecified side: Secondary | ICD-10-CM | POA: Diagnosis not present

## 2018-02-23 ENCOUNTER — Other Ambulatory Visit: Payer: Self-pay | Admitting: Neurosurgery

## 2018-02-23 DIAGNOSIS — M5440 Lumbago with sciatica, unspecified side: Secondary | ICD-10-CM

## 2018-02-26 ENCOUNTER — Ambulatory Visit
Admission: RE | Admit: 2018-02-26 | Discharge: 2018-02-26 | Disposition: A | Discharge status: Home / Self Care | Payer: Medicare Other | Source: Ambulatory Visit | Attending: Neurosurgery | Admitting: Neurosurgery

## 2018-02-26 DIAGNOSIS — M48061 Spinal stenosis, lumbar region without neurogenic claudication: Secondary | ICD-10-CM | POA: Diagnosis not present

## 2018-02-26 DIAGNOSIS — M5440 Lumbago with sciatica, unspecified side: Secondary | ICD-10-CM

## 2018-03-01 DIAGNOSIS — M545 Low back pain: Secondary | ICD-10-CM | POA: Diagnosis not present

## 2018-03-01 DIAGNOSIS — M4120 Other idiopathic scoliosis, site unspecified: Secondary | ICD-10-CM | POA: Diagnosis not present

## 2018-03-08 ENCOUNTER — Other Ambulatory Visit: Payer: Self-pay

## 2018-03-08 ENCOUNTER — Other Ambulatory Visit: Payer: Self-pay | Admitting: Neurosurgery

## 2018-03-08 DIAGNOSIS — M4120 Other idiopathic scoliosis, site unspecified: Secondary | ICD-10-CM

## 2018-03-09 ENCOUNTER — Ambulatory Visit
Admission: RE | Admit: 2018-03-09 | Discharge: 2018-03-09 | Disposition: A | Discharge status: Home / Self Care | Payer: Medicare Other | Source: Ambulatory Visit | Attending: Neurosurgery | Admitting: Neurosurgery

## 2018-03-09 ENCOUNTER — Other Ambulatory Visit: Payer: Self-pay | Admitting: Neurosurgery

## 2018-03-09 DIAGNOSIS — M4120 Other idiopathic scoliosis, site unspecified: Secondary | ICD-10-CM

## 2018-03-09 DIAGNOSIS — M545 Low back pain: Secondary | ICD-10-CM | POA: Diagnosis not present

## 2018-03-09 MED ORDER — METHYLPREDNISOLONE ACETATE 40 MG/ML INJ SUSP (RADIOLOG
120.0000 mg | Freq: Once | INTRAMUSCULAR | Status: AC
  Administered 2018-03-09: 120 mg via INTRA_ARTICULAR

## 2018-03-09 MED ORDER — IOPAMIDOL (ISOVUE-M 200) INJECTION 41%
1.0000 mL | Freq: Once | INTRAMUSCULAR | Status: AC
  Administered 2018-03-09: 1 mL via INTRA_ARTICULAR

## 2018-03-15 ENCOUNTER — Encounter: Payer: Self-pay | Admitting: Internal Medicine

## 2018-03-15 ENCOUNTER — Ambulatory Visit (INDEPENDENT_AMBULATORY_CARE_PROVIDER_SITE_OTHER): Payer: Medicare Other | Admitting: Internal Medicine

## 2018-03-15 VITALS — BP 126/70 | HR 65 | Temp 97.9°F | Resp 16 | Ht 61.0 in | Wt 146.2 lb

## 2018-03-15 DIAGNOSIS — J984 Other disorders of lung: Secondary | ICD-10-CM

## 2018-03-15 DIAGNOSIS — K2100 Gastro-esophageal reflux disease with esophagitis, without bleeding: Secondary | ICD-10-CM

## 2018-03-15 DIAGNOSIS — M545 Low back pain, unspecified: Secondary | ICD-10-CM

## 2018-03-15 DIAGNOSIS — J4520 Mild intermittent asthma, uncomplicated: Secondary | ICD-10-CM | POA: Diagnosis not present

## 2018-03-15 DIAGNOSIS — R739 Hyperglycemia, unspecified: Secondary | ICD-10-CM | POA: Diagnosis not present

## 2018-03-15 DIAGNOSIS — T148XXA Other injury of unspecified body region, initial encounter: Secondary | ICD-10-CM

## 2018-03-15 DIAGNOSIS — E7800 Pure hypercholesterolemia, unspecified: Secondary | ICD-10-CM

## 2018-03-15 DIAGNOSIS — I10 Essential (primary) hypertension: Secondary | ICD-10-CM

## 2018-03-15 LAB — CBC WITH DIFFERENTIAL/PLATELET
Basophils Absolute: 0.1 10*3/uL (ref 0.0–0.1)
Basophils Relative: 0.7 % (ref 0.0–3.0)
Eosinophils Absolute: 0.2 10*3/uL (ref 0.0–0.7)
Eosinophils Relative: 1.9 % (ref 0.0–5.0)
HCT: 43.6 % (ref 36.0–46.0)
Hemoglobin: 14.7 g/dL (ref 12.0–15.0)
Lymphocytes Relative: 26.5 % (ref 12.0–46.0)
Lymphs Abs: 2.7 10*3/uL (ref 0.7–4.0)
MCHC: 33.8 g/dL (ref 30.0–36.0)
MCV: 95.1 fl (ref 78.0–100.0)
MONO ABS: 0.7 10*3/uL (ref 0.1–1.0)
Monocytes Relative: 7.1 % (ref 3.0–12.0)
NEUTROS ABS: 6.6 10*3/uL (ref 1.4–7.7)
Neutrophils Relative %: 63.8 % (ref 43.0–77.0)
PLATELETS: 250 10*3/uL (ref 150.0–400.0)
RBC: 4.59 Mil/uL (ref 3.87–5.11)
RDW: 13.5 % (ref 11.5–15.5)
WBC: 10.3 10*3/uL (ref 4.0–10.5)

## 2018-03-15 LAB — LIPID PANEL
Cholesterol: 175 mg/dL (ref 0–200)
HDL: 82.6 mg/dL
LDL Cholesterol: 75 mg/dL (ref 0–99)
NonHDL: 92.88
Total CHOL/HDL Ratio: 2
Triglycerides: 91 mg/dL (ref 0.0–149.0)
VLDL: 18.2 mg/dL (ref 0.0–40.0)

## 2018-03-15 LAB — HEPATIC FUNCTION PANEL
ALT: 20 U/L (ref 0–35)
AST: 16 U/L (ref 0–37)
Albumin: 4.6 g/dL (ref 3.5–5.2)
Alkaline Phosphatase: 52 U/L (ref 39–117)
Bilirubin, Direct: 0.1 mg/dL (ref 0.0–0.3)
Total Bilirubin: 0.6 mg/dL (ref 0.2–1.2)
Total Protein: 7 g/dL (ref 6.0–8.3)

## 2018-03-15 LAB — BASIC METABOLIC PANEL
BUN: 14 mg/dL (ref 6–23)
CO2: 35 mEq/L — ABNORMAL HIGH (ref 19–32)
Calcium: 9.7 mg/dL (ref 8.4–10.5)
Chloride: 98 mEq/L (ref 96–112)
Creatinine, Ser: 0.64 mg/dL (ref 0.40–1.20)
GFR: 91.44 mL/min (ref >60.00)
Glucose, Bld: 94 mg/dL (ref 70–99)
POTASSIUM: 3.9 meq/L (ref 3.5–5.1)
Sodium: 138 mEq/L (ref 135–145)

## 2018-03-15 LAB — HEMOGLOBIN A1C: Hgb A1c MFr Bld: 5.5 % (ref 4.6–6.5)

## 2018-03-15 NOTE — Progress Notes (Signed)
[name], female   DOB: 1945-08-28, 73 y.o.   MRN: [id number]   Subjective:    [name], female    DOB: 17-Dec-1945, 73 y.o.   MRN: [id number]  HPI  Patient here for a scheduled physical.  We did her physical in 11/2017.  Changed visit to f/u appt.  She reports she is doing relatively well.  Has been seeing ortho and neurosurgery.  S/p injection in knee.  Has been having cramping in her left leg.  Was evaluated at [facility].  Had epidural injections - per pt.  With increased bruising lower back.  No pain.  Has f/u planned next week with Dr Saintclair Halsted.  No change with her bowels.  No hematuria.  No abdominal pain.  No chest pain.  Breathing doing well.  No sob.  No increased cough or congestion.  No acid reflux.     Past Medical History:  Diagnosis Date  . Anxiety    panic attacks  . Arthritis   . Asthma   . Bronchitis   . Depression   . 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 Gastritis   . GERD (gastroesophageal reflux disease)   . Hypertension   . PONV (postoperative nausea and vomiting)   . Scoliosis   . Ulcer    Past Surgical History:  Procedure Laterality Date  . ABDOMINAL HYSTERECTOMY    . ANTERIOR CERVICAL DECOMP/DISCECTOMY FUSION N/A 04/19/2017   Procedure: Anterior Cervical Decompression Fusion Cervical Three-Four, Cervical Four-Five, Cervical Five-Six;  Surgeon: Kary Kos, MD;  Location: [name];  Service: Neurosurgery;  Laterality: N/A;  Anterior Cervical Decompression Fusion Cervical Three-Four, Cervical Four-Five, Cervical Five-Six  . CHOLECYSTECTOMY  1996  . DILATION AND CURETTAGE OF UTERUS  1971  . LAPAROSCOPIC REMOVAL OF MESENTERIC MASS    . [place]  . PARTIAL HYSTERECTOMY  1981   prolapsed uterus  . TUBAL LIGATION  1978   Family History  Problem Relation Age of Onset  . Stroke Mother   . Cancer Mother        Brain   . CVA Maternal Grandmother   . Cancer Father   . Breast cancer Neg Hx     . Colon cancer Neg Hx    Social History   Socioeconomic History  . Marital status: Married    Spouse name: Not on file  . Number of children: 2  . Years of education: Not on file  . Highest education level: Not on file  Occupational History  . Occupation: retired Tour manager  . Financial resource strain: Not on file  . Food insecurity:    Worry: Not on file    Inability: Not on file  . Transportation needs:    Medical: Not on file    Non-medical: Not on file  Tobacco Use  . Smoking status: Never Smoker  . Smokeless tobacco: Never Used  Substance and Sexual Activity  . Alcohol use: No    Alcohol/week: 0.0 standard drinks  . Drug use: No  . Sexual activity: Not on file  Lifestyle  . Physical activity:    Days per week: Not on file    Minutes per session: Not on file  . Stress: Not on file  Relationships  . Social connections:    Talks on phone: Not on file    Gets together: Not on file    Attends  religious service: Not on file    Active member of club or organization: Not on file    Attends meetings of clubs or organizations: Not on file    Relationship status: Not on file  Other Topics Concern  . Not on file  Social History Narrative  . Not on file    Outpatient Encounter Medications as of 03/15/2018  Medication Sig  . citalopram (CELEXA) 40 MG tablet Take 1 tablet (40 mg total) by mouth daily.  . clonazePAM (KLONOPIN) 0.5 MG tablet TAKE ONE-HALF TO ONE TABLET AS NEEDED.  . fluticasone (FLONASE) 50 MCG/ACT nasal spray Place 2 sprays into the nose daily as needed for rhinitis.   . hydrochlorothiazide (HYDRODIURIL) 25 MG tablet Take 1 tablet (25 mg total) by mouth daily.  Marland Kitchen ibuprofen (ADVIL,MOTRIN) 200 MG tablet Take 200 mg by mouth 2 (two) times daily as needed for moderate pain.  Marland Kitchen ipratropium (ATROVENT) 0.02 % nebulizer solution Take 2.5 mLs (0.5 mg total) by nebulization every 6 (six) hours as needed for shortness of breath.  . levalbuterol (XOPENEX  HFA) 45 MCG/ACT inhaler Inhale 1-2 puffs into the lungs every 6 (six) hours as needed for wheezing or shortness of breath.   . Multiple Vitamin (MULTIVITAMIN WITH MINERALS) TABS tablet Take 1 tablet by mouth daily.  . mupirocin ointment (BACTROBAN) 2 % Apply to affected area bid (Patient taking differently: Apply 1 application topically daily as needed (irritation). )  . pravastatin (PRAVACHOL) 20 MG tablet Take 1 tablet (20 mg total) by mouth daily.  . Probiotic Product (PROBIOTIC DAILY PO) Take 1 capsule by mouth daily.   Marland Kitchen tiotropium (SPIRIVA HANDIHALER) 18 MCG inhalation capsule Place 1 capsule (18 mcg total) into inhaler and inhale daily.   No facility-administered encounter medications on file as of 03/15/2018.     Review of Systems  Constitutional: Negative for appetite change and unexpected weight change.  HENT: Negative for congestion and sinus pressure.   Eyes: Negative for pain and visual disturbance.  Respiratory: Negative for cough, chest tightness and shortness of breath.   Cardiovascular: Negative for chest pain, palpitations and leg swelling.  Gastrointestinal: Negative for abdominal pain, diarrhea, nausea and vomiting.  Genitourinary: Negative for difficulty urinating, dysuria and hematuria.  Musculoskeletal: Positive for back pain. Negative for joint swelling.  Skin: Negative for rash.       Bruising low back.    Neurological: Negative for dizziness, light-headedness and headaches.  Hematological: Negative for adenopathy. Does not bruise/bleed easily.  Psychiatric/Behavioral: Negative for agitation and dysphoric mood.       Objective:    Physical Exam Constitutional:      General: She is not in acute distress.    Appearance: Normal appearance.  HENT:     Nose: Nose normal. No congestion.     Mouth/Throat:     Pharynx: No oropharyngeal exudate or posterior oropharyngeal erythema.  Neck:     Musculoskeletal: Neck supple. No muscular tenderness.     Thyroid: No  thyromegaly.  Cardiovascular:     Rate and Rhythm: Normal rate and regular rhythm.  Pulmonary:     Effort: No respiratory distress.     Breath sounds: Normal breath sounds. No wheezing.  Abdominal:     General: Bowel sounds are normal.     Palpations: Abdomen is soft.     Tenderness: There is no abdominal tenderness.  Musculoskeletal:        General: No swelling or tenderness.  Lymphadenopathy:     Cervical: No cervical  adenopathy.  Skin:    Comments: Bruising - low back and into buttock.    Neurological:     Mental Status: She is alert.  Psychiatric:        Mood and Affect: Mood normal.        Behavior: Behavior normal.     BP 126/70   Pulse 65   Temp 97.9 F (36.6 C) (Oral)   Resp 16   Ht 5' 1"  (1.549 m)   Wt 146 lb 3.2 oz (66.3 kg)   SpO2 97%   BMI 27.62 kg/m  Wt Readings from Last 3 Encounters:  03/15/18 146 lb 3.2 oz (66.3 kg)  11/26/17 149 lb 9.6 oz (67.9 kg)  11/15/17 149 lb 6.4 oz (67.8 kg)     Lab Results  Component Value Date   WBC 10.3 03/15/2018   HGB 14.7 03/15/2018   HCT 43.6 03/15/2018   PLT 250.0 03/15/2018   GLUCOSE 94 03/15/2018   CHOL 175 03/15/2018   TRIG 91.0 03/15/2018   HDL 82.60 03/15/2018   LDLDIRECT 148.4 11/03/2012   LDLCALC 75 03/15/2018   ALT 20 03/15/2018   AST 16 03/15/2018   NA 138 03/15/2018   K 3.9 03/15/2018   CL 98 03/15/2018   CREATININE 0.64 03/15/2018   BUN 14 03/15/2018   CO2 35 (H) 03/15/2018   TSH 0.39 11/17/2017   HGBA1C 5.5 03/15/2018    Dg Facet Jt Inj L /s  2nd Level Left W/fl/ct  Result Date: 03/09/2018 CLINICAL DATA:  Scoliosis and facet mediated lumbago. EXAM: BILATERAL L3-L4 AND L4-L5 FACET INJECTIONS FLUOROSCOPY TIME:  Radiation Exposure Index (as provided by the fluoroscopic device): 4.7 mGy Number of Acquired Images:  0 TECHNIQUE: The procedure, risks, benefits, and alternatives were explained to the patient. Questions regarding the procedure were encouraged and answered. The patient understands  and consents to the procedure. LEFT L3-L4 FACET INJECTION: An appropriate skin entry site was determined fluoroscopically and marked. Site prepped with betadine, draped in usual sterile fashion, and infiltrated locally with 1% lidocaine. A posterior oblique approach was taken to the facet on the left at L3-L4 using a curved 3.5 inch 22 gauge spinal needle. Intra-articular/juxta-articular positioning was confirmed by injecting a small amount of Isovue-M 200. No vascular opacification is seen. 30 mg of Depo-Medrol mixed with 0.75 mL of 0.5% bupivacaine were instilled into the joint. The injection resulted in concordant pain. LEFT L4-L5 FACET INJECTION: An appropriate skin entry site was determined fluoroscopically and marked. Site prepped with betadine, draped in usual sterile fashion, and infiltrated locally with 1% lidocaine. A posterior oblique approach was taken to the facet on the left at L4-L5 using a curved 3.5 inch 22 gauge spinal needle. Intra-articular/juxta-articular positioning was confirmed by injecting a small amount of Isovue-M 200. No vascular opacification is seen. 30 mg of Depo-Medrol mixed with 0.75 mL of 0.5% bupivacaine were instilled into the joint. The injection resulted in concordant pain. RIGHT L3-L4 FACET INJECTION: An appropriate skin entry site was determined fluoroscopically and marked. Site prepped with betadine, draped in usual sterile fashion, and infiltrated locally with 1% lidocaine. A posterior oblique approach was taken to the facet on the right at L3-L4 using a curved 3.5 inch 22 gauge spinal needle. Intra-articular/juxta-articular positioning was confirmed by injecting a small amount of Isovue-M 200. No vascular opacification is seen. 30 mg of Depo-Medrol mixed with 0.75 mL of 0.5% bupivacaine were instilled into the joint. The injection resulted in concordant pain. RIGHT L4-L5 FACET INJECTION: An  appropriate skin entry site was determined fluoroscopically and marked. Site prepped  with betadine, draped in usual sterile fashion, and infiltrated locally with 1% lidocaine. A posterior oblique approach was taken to the facet on the right at L4-L5 using a curved 3.5 inch 22 gauge spinal needle. Intra-articular/juxta-articular positioning was confirmed by injecting a small amount of Isovue-M 200. No vascular opacification is seen. 30 mg of Depo-Medrol mixed with 0.75 mL of 0.5% bupivacaine were instilled into the joint. The injection resulted in concordant pain. The procedure was well-tolerated. IMPRESSION: 1. Technically successful bilateral L3-L4 and L4-L5 facet injections. Electronically Signed   By: Titus Dubin M.D.   On: 03/09/2018 15:01   Dg Facet Jt Inj L /s 2nd Level Right W/fl/ct  Result Date: 03/09/2018 CLINICAL DATA:  Scoliosis and facet mediated lumbago. EXAM: BILATERAL L3-L4 AND L4-L5 FACET INJECTIONS FLUOROSCOPY TIME:  Radiation Exposure Index (as provided by the fluoroscopic device): 4.7 mGy Number of Acquired Images:  0 TECHNIQUE: The procedure, risks, benefits, and alternatives were explained to the patient. Questions regarding the procedure were encouraged and answered. The patient understands and consents to the procedure. LEFT L3-L4 FACET INJECTION: An appropriate skin entry site was determined fluoroscopically and marked. Site prepped with betadine, draped in usual sterile fashion, and infiltrated locally with 1% lidocaine. A posterior oblique approach was taken to the facet on the left at L3-L4 using a curved 3.5 inch 22 gauge spinal needle. Intra-articular/juxta-articular positioning was confirmed by injecting a small amount of Isovue-M 200. No vascular opacification is seen. 30 mg of Depo-Medrol mixed with 0.75 mL of 0.5% bupivacaine were instilled into the joint. The injection resulted in concordant pain. LEFT L4-L5 FACET INJECTION: An appropriate skin entry site was determined fluoroscopically and marked. Site prepped with betadine, draped in usual sterile fashion,  and infiltrated locally with 1% lidocaine. A posterior oblique approach was taken to the facet on the left at L4-L5 using a curved 3.5 inch 22 gauge spinal needle. Intra-articular/juxta-articular positioning was confirmed by injecting a small amount of Isovue-M 200. No vascular opacification is seen. 30 mg of Depo-Medrol mixed with 0.75 mL of 0.5% bupivacaine were instilled into the joint. The injection resulted in concordant pain. RIGHT L3-L4 FACET INJECTION: An appropriate skin entry site was determined fluoroscopically and marked. Site prepped with betadine, draped in usual sterile fashion, and infiltrated locally with 1% lidocaine. A posterior oblique approach was taken to the facet on the right at L3-L4 using a curved 3.5 inch 22 gauge spinal needle. Intra-articular/juxta-articular positioning was confirmed by injecting a small amount of Isovue-M 200. No vascular opacification is seen. 30 mg of Depo-Medrol mixed with 0.75 mL of 0.5% bupivacaine were instilled into the joint. The injection resulted in concordant pain. RIGHT L4-L5 FACET INJECTION: An appropriate skin entry site was determined fluoroscopically and marked. Site prepped with betadine, draped in usual sterile fashion, and infiltrated locally with 1% lidocaine. A posterior oblique approach was taken to the facet on the right at L4-L5 using a curved 3.5 inch 22 gauge spinal needle. Intra-articular/juxta-articular positioning was confirmed by injecting a small amount of Isovue-M 200. No vascular opacification is seen. 30 mg of Depo-Medrol mixed with 0.75 mL of 0.5% bupivacaine were instilled into the joint. The injection resulted in concordant pain. The procedure was well-tolerated. IMPRESSION: 1. Technically successful bilateral L3-L4 and L4-L5 facet injections. Electronically Signed   By: Titus Dubin M.D.   On: 03/09/2018 15:01   Dg Facet Jt Inj L /s Single Level Left W/fl/ct  Result Date: 03/09/2018 CLINICAL DATA:  Scoliosis and facet  mediated lumbago. EXAM: BILATERAL L3-L4 AND L4-L5 FACET INJECTIONS FLUOROSCOPY TIME:  Radiation Exposure Index (as provided by the fluoroscopic device): 4.7 mGy Number of Acquired Images:  0 TECHNIQUE: The procedure, risks, benefits, and alternatives were explained to the patient. Questions regarding the procedure were encouraged and answered. The patient understands and consents to the procedure. LEFT L3-L4 FACET INJECTION: An appropriate skin entry site was determined fluoroscopically and marked. Site prepped with betadine, draped in usual sterile fashion, and infiltrated locally with 1% lidocaine. A posterior oblique approach was taken to the facet on the left at L3-L4 using a curved 3.5 inch 22 gauge spinal needle. Intra-articular/juxta-articular positioning was confirmed by injecting a small amount of Isovue-M 200. No vascular opacification is seen. 30 mg of Depo-Medrol mixed with 0.75 mL of 0.5% bupivacaine were instilled into the joint. The injection resulted in concordant pain. LEFT L4-L5 FACET INJECTION: An appropriate skin entry site was determined fluoroscopically and marked. Site prepped with betadine, draped in usual sterile fashion, and infiltrated locally with 1% lidocaine. A posterior oblique approach was taken to the facet on the left at L4-L5 using a curved 3.5 inch 22 gauge spinal needle. Intra-articular/juxta-articular positioning was confirmed by injecting a small amount of Isovue-M 200. No vascular opacification is seen. 30 mg of Depo-Medrol mixed with 0.75 mL of 0.5% bupivacaine were instilled into the joint. The injection resulted in concordant pain. RIGHT L3-L4 FACET INJECTION: An appropriate skin entry site was determined fluoroscopically and marked. Site prepped with betadine, draped in usual sterile fashion, and infiltrated locally with 1% lidocaine. A posterior oblique approach was taken to the facet on the right at L3-L4 using a curved 3.5 inch 22 gauge spinal needle.  Intra-articular/juxta-articular positioning was confirmed by injecting a small amount of Isovue-M 200. No vascular opacification is seen. 30 mg of Depo-Medrol mixed with 0.75 mL of 0.5% bupivacaine were instilled into the joint. The injection resulted in concordant pain. RIGHT L4-L5 FACET INJECTION: An appropriate skin entry site was determined fluoroscopically and marked. Site prepped with betadine, draped in usual sterile fashion, and infiltrated locally with 1% lidocaine. A posterior oblique approach was taken to the facet on the right at L4-L5 using a curved 3.5 inch 22 gauge spinal needle. Intra-articular/juxta-articular positioning was confirmed by injecting a small amount of Isovue-M 200. No vascular opacification is seen. 30 mg of Depo-Medrol mixed with 0.75 mL of 0.5% bupivacaine were instilled into the joint. The injection resulted in concordant pain. The procedure was well-tolerated. IMPRESSION: 1. Technically successful bilateral L3-L4 and L4-L5 facet injections. Electronically Signed   By: Titus Dubin M.D.   On: 03/09/2018 15:01   Dg Facet Jt Inj L /s Single Level Right W/fl/ct  Result Date: 03/09/2018 CLINICAL DATA:  Scoliosis and facet mediated lumbago. EXAM: BILATERAL L3-L4 AND L4-L5 FACET INJECTIONS FLUOROSCOPY TIME:  Radiation Exposure Index (as provided by the fluoroscopic device): 4.7 mGy Number of Acquired Images:  0 TECHNIQUE: The procedure, risks, benefits, and alternatives were explained to the patient. Questions regarding the procedure were encouraged and answered. The patient understands and consents to the procedure. LEFT L3-L4 FACET INJECTION: An appropriate skin entry site was determined fluoroscopically and marked. Site prepped with betadine, draped in usual sterile fashion, and infiltrated locally with 1% lidocaine. A posterior oblique approach was taken to the facet on the left at L3-L4 using a curved 3.5 inch 22 gauge spinal needle. Intra-articular/juxta-articular positioning  was confirmed by injecting a small  amount of Isovue-M 200. No vascular opacification is seen. 30 mg of Depo-Medrol mixed with 0.75 mL of 0.5% bupivacaine were instilled into the joint. The injection resulted in concordant pain. LEFT L4-L5 FACET INJECTION: An appropriate skin entry site was determined fluoroscopically and marked. Site prepped with betadine, draped in usual sterile fashion, and infiltrated locally with 1% lidocaine. A posterior oblique approach was taken to the facet on the left at L4-L5 using a curved 3.5 inch 22 gauge spinal needle. Intra-articular/juxta-articular positioning was confirmed by injecting a small amount of Isovue-M 200. No vascular opacification is seen. 30 mg of Depo-Medrol mixed with 0.75 mL of 0.5% bupivacaine were instilled into the joint. The injection resulted in concordant pain. RIGHT L3-L4 FACET INJECTION: An appropriate skin entry site was determined fluoroscopically and marked. Site prepped with betadine, draped in usual sterile fashion, and infiltrated locally with 1% lidocaine. A posterior oblique approach was taken to the facet on the right at L3-L4 using a curved 3.5 inch 22 gauge spinal needle. Intra-articular/juxta-articular positioning was confirmed by injecting a small amount of Isovue-M 200. No vascular opacification is seen. 30 mg of Depo-Medrol mixed with 0.75 mL of 0.5% bupivacaine were instilled into the joint. The injection resulted in concordant pain. RIGHT L4-L5 FACET INJECTION: An appropriate skin entry site was determined fluoroscopically and marked. Site prepped with betadine, draped in usual sterile fashion, and infiltrated locally with 1% lidocaine. A posterior oblique approach was taken to the facet on the right at L4-L5 using a curved 3.5 inch 22 gauge spinal needle. Intra-articular/juxta-articular positioning was confirmed by injecting a small amount of Isovue-M 200. No vascular opacification is seen. 30 mg of Depo-Medrol mixed with 0.75 mL of 0.5%  bupivacaine were instilled into the joint. The injection resulted in concordant pain. The procedure was well-tolerated. IMPRESSION: 1. Technically successful bilateral L3-L4 and L4-L5 facet injections. Electronically Signed   By: Titus Dubin M.D.   On: 03/09/2018 15:01       Assessment & Plan:   Problem List Items Addressed This Visit    Asthma    Breathing stable.        Chronic restrictive lung disease    Has been followed by pulmonary.  Breathing stable.        Essential hypertension, benign    Blood pressure under good control.  Continue same medication regimen.  Follow pressures.  Follow metabolic panel.        GERD (gastroesophageal reflux disease)    Controlled.        Hypercholesterolemia    On pravastatin.  Low cholesterol diet and exercise.  Follow lipid panel and liver function tests.        Hyperglycemia    Low carb diet and exercise.  Follow met b and a1c.        Left low back pain    Seeing ortho and neurosurgery.  Recent injections at [facility].  Bruising as outlined.  Discussed with [facility].  Injection last week.  Recommended continuing to monitor.  Keep f/u with Dr Saintclair Halsted next week.         Other Visit Diagnoses    Hematoma    -  Primary   Relevant Orders   CBC with Differential/Platelet (Completed)       [name], MD

## 2018-03-19 ENCOUNTER — Encounter: Payer: Self-pay | Admitting: Internal Medicine

## 2018-03-19 NOTE — Assessment & Plan Note (Signed)
Has been followed by pulmonary.  Breathing stable.  

## 2018-03-19 NOTE — Assessment & Plan Note (Signed)
Seeing ortho and neurosurgery.  Recent injections at [facility].  Bruising as outlined.  Discussed with [facility].  Injection last week.  Recommended continuing to monitor.  Keep f/u with Dr Saintclair Halsted next week.

## 2018-03-19 NOTE — Assessment & Plan Note (Signed)
Controlled.  

## 2018-03-19 NOTE — Assessment & Plan Note (Signed)
Low carb diet and exercise.  Follow met b and a1c.   

## 2018-03-19 NOTE — Assessment & Plan Note (Signed)
Breathing stable.

## 2018-03-19 NOTE — Assessment & Plan Note (Signed)
On pravastatin.  Low cholesterol diet and exercise.  Follow lipid panel and liver function tests.   

## 2018-03-19 NOTE — Assessment & Plan Note (Signed)
Blood pressure under good control.  Continue same medication regimen.  Follow pressures.  Follow metabolic panel.   

## 2018-03-22 DIAGNOSIS — M4120 Other idiopathic scoliosis, site unspecified: Secondary | ICD-10-CM | POA: Diagnosis not present

## 2018-03-22 DIAGNOSIS — M5440 Lumbago with sciatica, unspecified side: Secondary | ICD-10-CM | POA: Diagnosis not present

## 2018-03-24 ENCOUNTER — Other Ambulatory Visit: Payer: Self-pay | Admitting: Internal Medicine

## 2018-03-29 ENCOUNTER — Other Ambulatory Visit: Payer: Medicare PPO

## 2018-03-30 ENCOUNTER — Other Ambulatory Visit: Payer: Self-pay | Admitting: Internal Medicine

## 2018-03-30 NOTE — Telephone Encounter (Signed)
Last OV 03/15/2018 Next OV 12/19/2018 Last refill 12/18/2017

## 2018-03-31 ENCOUNTER — Ambulatory Visit: Payer: Medicare PPO | Admitting: Internal Medicine

## 2018-04-10 ENCOUNTER — Other Ambulatory Visit: Payer: Self-pay | Admitting: Internal Medicine

## 2018-04-25 ENCOUNTER — Emergency Department
Admission: EM | Admit: 2018-04-25 | Discharge: 2018-04-25 | Disposition: A | Discharge status: Home / Self Care | Payer: Medicare Other | Attending: Emergency Medicine | Admitting: Emergency Medicine

## 2018-04-25 ENCOUNTER — Encounter: Payer: Self-pay | Admitting: Emergency Medicine

## 2018-04-25 ENCOUNTER — Other Ambulatory Visit: Payer: Self-pay

## 2018-04-25 ENCOUNTER — Emergency Department: Payer: Medicare Other

## 2018-04-25 ENCOUNTER — Other Ambulatory Visit: Payer: Self-pay | Admitting: Internal Medicine

## 2018-04-25 DIAGNOSIS — Z79899 Other long term (current) drug therapy: Secondary | ICD-10-CM | POA: Insufficient documentation

## 2018-04-25 DIAGNOSIS — I10 Essential (primary) hypertension: Secondary | ICD-10-CM | POA: Diagnosis not present

## 2018-04-25 DIAGNOSIS — L0390 Cellulitis, unspecified: Secondary | ICD-10-CM

## 2018-04-25 DIAGNOSIS — L03115 Cellulitis of right lower limb: Secondary | ICD-10-CM | POA: Insufficient documentation

## 2018-04-25 DIAGNOSIS — L538 Other specified erythematous conditions: Secondary | ICD-10-CM | POA: Diagnosis not present

## 2018-04-25 DIAGNOSIS — R2241 Localized swelling, mass and lump, right lower limb: Secondary | ICD-10-CM | POA: Diagnosis present

## 2018-04-25 DIAGNOSIS — J45909 Unspecified asthma, uncomplicated: Secondary | ICD-10-CM | POA: Insufficient documentation

## 2018-04-25 MED ORDER — DOXYCYCLINE HYCLATE 100 MG PO CAPS: 100.0000 mg | ORAL_CAPSULE | Freq: Two times a day (BID) | ORAL | 0 refills | Status: AC

## 2018-04-25 NOTE — ED Provider Notes (Signed)
[facility] Emergency Department Provider Note   ____________________________________________   I have reviewed the triage vital signs and the nursing notes.   HISTORY  Chief Complaint Leg Swelling   History limited by: Not Limited   HPI Jennifer [name] is a 73 y.o. female who presents to the emergency department today because of concern for redness and swelling to the right lower leg. Patient states she first noticed those symptoms this morning. She denies any trauma to her leg recently. Has been slightly more immobile than normal recently because of surgery and stay at home order. She denies any pain. No pain with ambulation. Denies similar symptoms in the past, no history of blood clots although her mother had phlebitis. She denies any chest pain, shortness of breath, fever.    Records reviewed. Per medical record review patient has a history of asthma, HTN.   Past Medical History:  Diagnosis Date  . Anxiety    panic attacks  . Arthritis   . Asthma   . Bronchitis   . Depression   . 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 Gastritis   . GERD (gastroesophageal reflux disease)   . Hypertension   . PONV (postoperative nausea and vomiting)   . Scoliosis   . Ulcer     Patient Active Problem List   Diagnosis Date Noted  . Radiculopathy of cervical spine 04/20/2017  . 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 Ear pain, left 03/27/2017  . Right arm pain 11/12/2016  . Vertigo 09/02/2016  . Left low back pain 05/22/2015  . Left hip pain 05/22/2015  . SOB (shortness of breath) 11/11/2014  . Right shoulder pain 07/16/2014  . Health care maintenance 06/03/2014  . Arm skin lesion, left 06/18/2013  . URI (upper respiratory infection) 03/03/2013  . Hyperglycemia 11/12/2012  . Hypercholesterolemia 11/12/2012  . Essential hypertension, benign 05/12/2012  . GERD (gastroesophageal reflux disease) 05/12/2012  . Gastritis 05/12/2012  .  History of colonic polyps 05/12/2012  . Asthma 12/13/2011  . Chronic restrictive lung disease 05/11/2011    Past Surgical History:  Procedure Laterality Date  . ABDOMINAL HYSTERECTOMY    . ANTERIOR CERVICAL DECOMP/DISCECTOMY FUSION N/A 04/19/2017   Procedure: Anterior Cervical Decompression Fusion Cervical Three-Four, Cervical Four-Five, Cervical Five-Six;  Surgeon: Kary Kos, MD;  Location: [place];  Service: Neurosurgery;  Laterality: N/A;  Anterior Cervical Decompression Fusion Cervical Three-Four, Cervical Four-Five, Cervical Five-Six  . CHOLECYSTECTOMY  1996  . DILATION AND CURETTAGE OF UTERUS  1971  . LAPAROSCOPIC REMOVAL OF MESENTERIC MASS    . [place]  . PARTIAL HYSTERECTOMY  1981   prolapsed uterus  . TUBAL LIGATION  1978    Prior to Admission medications   Medication Sig Start Date End Date Taking? Authorizing Provider  citalopram (CELEXA) 40 MG tablet Take 1 tablet (40 mg total) by mouth daily. 02/07/18   [name], MD  clonazePAM (KLONOPIN) 0.5 MG tablet TAKE ONE-HALF TO ONE TABLET AS NEEDED. 03/30/18   [name], MD  fluticasone (FLONASE) 50 MCG/ACT nasal spray Place 2 sprays into the nose daily as needed for rhinitis.  12/10/11   [name], MD  hydrochlorothiazide (HYDRODIURIL) 25 MG tablet Take 1 tablet (25 mg total) by mouth daily. 12/09/17   [name], MD  ibuprofen (ADVIL,MOTRIN) 200 MG tablet Take 200 mg by mouth 2 (two) times daily as needed for moderate pain.  [provider]  ipratropium (ATROVENT) 0.02 % nebulizer solution Take 2.5 mLs (0.5 mg total) by nebulization every 6 (six) hours as needed for shortness of breath. 11/15/17   Jodelle Green, FNP  levalbuterol (XOPENEX HFA) 45 MCG/ACT inhaler Inhale 1-2 puffs into the lungs every 6 (six) hours as needed for wheezing or shortness of breath.     [provider]  Multiple Vitamin (MULTIVITAMIN WITH MINERALS) TABS tablet Take 1 tablet by mouth daily.     [provider]  mupirocin ointment (BACTROBAN) 2 % Apply to affected area bid Patient taking differently: Apply 1 application topically daily as needed (irritation).  03/24/17   [name], MD  pravastatin (PRAVACHOL) 20 MG tablet Take 1 tablet (20 mg total) by mouth daily. 04/11/18   [name], MD  Probiotic Product (PROBIOTIC DAILY PO) Take 1 capsule by mouth daily.     [provider]  tiotropium (SPIRIVA HANDIHALER) 18 MCG inhalation capsule Place 1 capsule (18 mcg total) into inhaler and inhale daily. 03/30/18   [name], MD    Allergies Advair diskus [fluticasone-salmeterol] and Codeine  Family History  Problem Relation Age of Onset  . Stroke Mother   . Cancer Mother        Brain   . CVA Maternal Grandmother   . Cancer Father   . Breast cancer Neg Hx   . Colon cancer Neg Hx     Social History Social History   Tobacco Use  . Smoking status: Never Smoker  . Smokeless tobacco: Never Used  Substance Use Topics  . Alcohol use: No    Alcohol/week: 0.0 standard drinks  . Drug use: No    Review of Systems Constitutional: No fever/chills Eyes: No visual changes. ENT: No sore throat. Cardiovascular: Denies chest pain. Respiratory: Denies shortness of breath. Gastrointestinal: No abdominal pain.  No nausea, no vomiting.  No diarrhea.   Genitourinary: Negative for dysuria. Musculoskeletal: Positive for right lower leg swelling and redness.  Skin: Negative for rash. Neurological: Negative for headaches, focal weakness or numbness.  ____________________________________________   PHYSICAL EXAM:  VITAL SIGNS: ED Triage Vitals  Enc Vitals Group     BP 04/25/18 1531 (!) 124/55     Pulse Rate 04/25/18 1531 72     Resp 04/25/18 1531 16     Temp 04/25/18 1531 97.9 F (36.6 C)     Temp Source 04/25/18 1531 Oral     SpO2 04/25/18 1531 98 %     Weight 04/25/18 1528 146 lb (66.2 kg)     Height 04/25/18 1528 5\' 1"  (1.549 m)     Head  Circumference --      Peak Flow --      Pain Score 04/25/18 1527 0    Constitutional: Alert and oriented.  Eyes: Conjunctivae are normal.  ENT      Head: Normocephalic and atraumatic.      Nose: No congestion/rhinnorhea.      Mouth/Throat: Mucous membranes are moist.      Neck: No stridor. Hematological/Lymphatic/Immunilogical: No cervical lymphadenopathy. Cardiovascular: Normal rate, regular rhythm.  No murmurs, rubs, or gallops.  Respiratory: Normal respiratory effort without tachypnea nor retractions. Breath sounds are clear and equal bilaterally. No wheezes/rales/rhonchi. Gastrointestinal: Soft and non tender. No rebound. No guarding.  Genitourinary: Deferred Musculoskeletal: Normal range of motion in all extremities. Slight swelling to right lower leg on the anterior lateral side. DP 2+. Non tender.  Neurologic:  Normal speech and language. No gross focal neurologic  deficits are appreciated.  Skin:  Erythema to right lateral anterior lower leg. Warmth appreciated.  Psychiatric: Mood and affect are normal. Speech and behavior are normal. Patient exhibits appropriate insight and judgment.  ____________________________________________    LABS (pertinent positives/negatives)  None  ____________________________________________   EKG  None  ____________________________________________    RADIOLOGY  [place] right lower ext  No DVT  ____________________________________________   PROCEDURES  Procedures  ____________________________________________   INITIAL IMPRESSION / ASSESSMENT AND PLAN / ED COURSE  Pertinent labs & imaging results that were available during my care of the patient were reviewed by me and considered in my medical decision making (see chart for details).   Patient presented because of right lower leg redness and swelling. On exam there is some erythma and warmth. [place] did not show any evidence of clot. At this time I think likely cellulitis. Discussed  this with the patient. Discussed return precautions.    ____________________________________________   FINAL CLINICAL IMPRESSION(S) / ED DIAGNOSES  Final diagnoses:  Cellulitis, unspecified cellulitis site     Note: This dictation was prepared with Dragon dictation. Any transcriptional errors that result from this process are unintentional     Nance Pear, MD 04/25/18 1656

## 2018-04-25 NOTE — ED Triage Notes (Addendum)
Pt c/o RT leg swelling noticed this am. PT has no hx of DVT. Pt denies pain. + redness and swelling noted. Denies SOB

## 2018-04-25 NOTE — ED Notes (Signed)
Pt states she noticed redness on right lower leg starting this morning, no pain or itching,no known injury

## 2018-04-25 NOTE — Discharge Instructions (Addendum)
Please seek medical attention for any high fevers, chest pain, shortness of breath, change in behavior, persistent vomiting, bloody stool or any other new or concerning symptoms.  

## 2018-04-26 ENCOUNTER — Telehealth: Payer: Self-pay

## 2018-04-26 NOTE — Telephone Encounter (Signed)
ED follow up

## 2018-04-26 NOTE — Telephone Encounter (Signed)
Copied from [place] [id number]. Topic: General - Inquiry >> Apr 26, 2018 11:24 AM Ahmed Prima L wrote: Reason for CRM: pt states she went to the emergency room yesterday because her right leg was red and hot. She was diagnosed with Cellulitis. She wants to know does she need to have a follow up visit, she is feeling a lot better because she is taking doxycycline (VIBRAMYCIN) 100 MG capsule.

## 2018-04-27 NOTE — Telephone Encounter (Signed)
Patient has been scheduled with Dr Nicki Reaper for 4/21. Doxy approved. Confirmed pt doing okay. She will call if problems. Tolerating abx well.

## 2018-04-27 NOTE — Telephone Encounter (Signed)
LMTCB. OK to do virtual or phone visit with pt if agreeable

## 2018-04-27 NOTE — Telephone Encounter (Signed)
Patient returned call for Garnette Scheuermann Please call back at Ph# [phone]

## 2018-05-03 DIAGNOSIS — M4120 Other idiopathic scoliosis, site unspecified: Secondary | ICD-10-CM | POA: Diagnosis not present

## 2018-05-04 ENCOUNTER — Other Ambulatory Visit: Payer: Self-pay | Admitting: Internal Medicine

## 2018-05-09 ENCOUNTER — Other Ambulatory Visit: Payer: Self-pay | Admitting: Internal Medicine

## 2018-05-10 ENCOUNTER — Ambulatory Visit (INDEPENDENT_AMBULATORY_CARE_PROVIDER_SITE_OTHER): Payer: Medicare Other | Admitting: Internal Medicine

## 2018-05-10 ENCOUNTER — Encounter: Payer: Self-pay | Admitting: Internal Medicine

## 2018-05-10 DIAGNOSIS — E7800 Pure hypercholesterolemia, unspecified: Secondary | ICD-10-CM

## 2018-05-10 DIAGNOSIS — J4520 Mild intermittent asthma, uncomplicated: Secondary | ICD-10-CM

## 2018-05-10 DIAGNOSIS — L03115 Cellulitis of right lower limb: Secondary | ICD-10-CM | POA: Diagnosis not present

## 2018-05-10 DIAGNOSIS — M545 Low back pain, unspecified: Secondary | ICD-10-CM

## 2018-05-10 DIAGNOSIS — I10 Essential (primary) hypertension: Secondary | ICD-10-CM

## 2018-05-10 DIAGNOSIS — R739 Hyperglycemia, unspecified: Secondary | ICD-10-CM

## 2018-05-10 NOTE — Progress Notes (Addendum)
[name], female   DOB: 04/09/1945, 73 y.o.   MRN: [id number] Virtual Visit via Telephone Note  This visit type was conducted due to national recommendations for restrictions regarding the COVID-19 pandemic (e.g. social distancing).  This format is felt to be most appropriate for this patient at this time.  All issues noted in this document were discussed and addressed.  No physical exam was performed (except for noted visual exam findings with Video Visits).   I connected with Jennifer [name] on 05/10/18 at  9:00 AM EDT by a telephone and verified that I am speaking with the correct person using two identifiers. Location patient: home Location provider: work Persons participating in the virtual visit: patient, provider  I discussed the limitations, risks, security and privacy concerns of performing an evaluation and management service by telephone and the availability of in person appointments.  The patient expressed understanding and agreed to proceed.   Reason for visit: ER follow up.   HPI: She was seen in ER 04/25/18 for increased leg swelling and redness.  Had lower extremity ultrasound that was negative for DVT.  Was treated with abx.  Completed abx.  Leg is better now.  No swelling.  No redness.  Normal now.  Trying to stay active.  She is walking.  No chest pain.  No sob.  No acid reflux.  No abdominal pain.  Low back pain is better.  Hematoma resolved.  Breathing is stable.  No cough or congestion.  No fever.  No known COVID exposure.     ROS: See pertinent positives and negatives per HPI.  Past Medical History:  Diagnosis Date  . Anxiety    panic attacks  . Arthritis   . Asthma   . Bronchitis   . Depression   . 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 Gastritis   . GERD (gastroesophageal reflux disease)   . Hypertension   . PONV (postoperative nausea and vomiting)   . Scoliosis   . Ulcer     Past Surgical History:  Procedure Laterality Date   . ABDOMINAL HYSTERECTOMY    . ANTERIOR CERVICAL DECOMP/DISCECTOMY FUSION N/A 04/19/2017   Procedure: Anterior Cervical Decompression Fusion Cervical Three-Four, Cervical Four-Five, Cervical Five-Six;  Surgeon: Kary Kos, MD;  Location: [place];  Service: Neurosurgery;  Laterality: N/A;  Anterior Cervical Decompression Fusion Cervical Three-Four, Cervical Four-Five, Cervical Five-Six  . CHOLECYSTECTOMY  1996  . DILATION AND CURETTAGE OF UTERUS  1971  . LAPAROSCOPIC REMOVAL OF MESENTERIC MASS    . [place]  . PARTIAL HYSTERECTOMY  1981   prolapsed uterus  . TUBAL LIGATION  1978    Family History  Problem Relation Age of Onset  . Stroke Mother   . Cancer Mother        Brain   . CVA Maternal Grandmother   . Cancer Father   . Breast cancer Neg Hx   . Colon cancer Neg Hx     SOCIAL HX: reviewed.    Current Outpatient Medications:  .  citalopram (CELEXA) 40 MG tablet, Take 1 tablet (40 mg total) by mouth daily., Disp: 90 tablet, Rfl: 1 .  clonazePAM (KLONOPIN) 0.5 MG tablet, TAKE ONE-HALF TO ONE TABLET AS NEEDED., Disp: 30 tablet, Rfl: 0 .  fluticasone (FLONASE) 50 MCG/ACT nasal spray, Place 2 sprays into the nose daily as needed for rhinitis. , Disp: , Rfl:  .  hydrochlorothiazide (HYDRODIURIL) 25 MG tablet, Take 1 tablet (25 mg total) by mouth daily., Disp: 30 tablet, Rfl: 1 .  ibuprofen (ADVIL,MOTRIN) 200 MG tablet, Take 200 mg by mouth 2 (two) times daily as needed for moderate pain., Disp: , Rfl:  .  ipratropium (ATROVENT) 0.02 % nebulizer solution, Take 2.5 mLs (0.5 mg total) by nebulization every 6 (six) hours as needed for shortness of breath., Disp: 75 mL, Rfl: 1 .  levalbuterol (XOPENEX HFA) 45 MCG/ACT inhaler, Inhale 1-2 puffs into the lungs every 6 (six) hours as needed for wheezing or shortness of breath. , Disp: , Rfl:  .  Multiple Vitamin (MULTIVITAMIN WITH MINERALS) TABS tablet, Take 1 tablet by mouth daily., Disp: , Rfl:  .  mupirocin ointment  (BACTROBAN) 2 %, Apply to affected area bid (Patient taking differently: Apply 1 application topically daily as needed (irritation). ), Disp: 22 g, Rfl: 0 .  pravastatin (PRAVACHOL) 20 MG tablet, Take 1 tablet (20 mg total) by mouth daily., Disp: 30 tablet, Rfl: 0 .  Probiotic Product (PROBIOTIC DAILY PO), Take 1 capsule by mouth daily. , Disp: , Rfl:  .  tiotropium (SPIRIVA HANDIHALER) 18 MCG inhalation capsule, Place 1 capsule (18 mcg total) into inhaler and inhale daily., Disp: 30 capsule, Rfl: 0  EXAM:  GENERAL: alert, oriented, appears well and in no acute distress  HEENT: atraumatic, conjunttiva clear, no obvious abnormalities on inspection of external nose and ears  NECK: normal movements of the head and neck  LUNGS: on inspection no signs of respiratory distress, breathing rate appears normal, no obvious gross SOB, gasping or wheezing  CV: no obvious cyanosis  PSYCH/NEURO: pleasant and cooperative, no obvious depression or anxiety, speech and thought processing grossly intact  ASSESSMENT AND PLAN:  Discussed the following assessment and plan:  Cellulitis of right lower extremity - Was seen in ER and treated.  Ultrasound negative for DVT.  Took abx.  Swelling and redness resolved.    Mild intermittent asthma without complication  Essential hypertension, benign  Hypercholesterolemia  Hyperglycemia  Left-sided low back pain without sciatica, unspecified chronicity  Asthma Breathing stable.  Doing well.    Essential hypertension, benign Blood pressure has been doing well.  Same medication regimen.  Follow.   Hypercholesterolemia On pravastatin.  Low cholesterol diet and exercise.  Follow lipid panel and liver function tests.    Hyperglycemia Low carb diet and exercise.  Follow met b and a1c.   Left low back pain Back pain is better.  Seeing neurosurgery. Hematoma resolved.      I discussed the assessment and treatment plan with the patient. The patient was  provided an opportunity to ask questions and all were answered. The patient agreed with the plan and demonstrated an understanding of the instructions.   The patient was advised to call back or seek an in-person evaluation if the symptoms worsen or if the condition fails to improve as anticipated.  I provided 15 minutes of non-face-to-face time during this encounter.   [name], MD

## 2018-05-14 ENCOUNTER — Encounter: Payer: Self-pay | Admitting: Internal Medicine

## 2018-05-14 NOTE — Assessment & Plan Note (Signed)
Back pain is better.  Seeing neurosurgery. Hematoma resolved.

## 2018-05-14 NOTE — Assessment & Plan Note (Signed)
Blood pressure has been doing well.  Same medication regimen.  Follow.

## 2018-05-14 NOTE — Assessment & Plan Note (Signed)
Breathing stable.  Doing well.

## 2018-05-14 NOTE — Assessment & Plan Note (Signed)
On pravastatin.  Low cholesterol diet and exercise.  Follow lipid panel and liver function tests.   

## 2018-05-14 NOTE — Assessment & Plan Note (Signed)
Low carb diet and exercise.  Follow met b and a1c.  

## 2018-06-08 ENCOUNTER — Other Ambulatory Visit: Payer: Self-pay | Admitting: Internal Medicine

## 2018-06-08 NOTE — Telephone Encounter (Signed)
Pt called in stated she only has 1 pill left for today and will not have any left.  Please advise

## 2018-06-08 NOTE — Telephone Encounter (Signed)
Pt called and stated that she only has one pill left.  Refilled: 03/30/2018 Last OV: 05/10/2018 Next OV: 12/19/2018

## 2018-07-05 ENCOUNTER — Other Ambulatory Visit: Payer: Self-pay | Admitting: Internal Medicine

## 2018-07-11 ENCOUNTER — Telehealth: Payer: Self-pay | Admitting: Internal Medicine

## 2018-07-11 NOTE — Telephone Encounter (Signed)
Last OV 05/10/18

## 2018-07-11 NOTE — Telephone Encounter (Signed)
Medication: levalbuterol [facility] HFA) 45 MCG/ACT inhaler [[id number]]   Has the patient contacted their pharmacy? Yes  (Agent: If no, request that the patient contact the pharmacy for the refill.) (Agent: If yes, when and what did the pharmacy advise?)  Preferred Pharmacy (with phone number or street name): [place], [place] [phone] (Phone) [phone] (Fax)    Agent: Please be advised that RX refills may take up to 3 business days. We ask that you follow-up with your pharmacy.

## 2018-07-12 MED ORDER — LEVALBUTEROL TARTRATE 45 MCG/ACT IN AERO: 1.0000 | INHALATION_SPRAY | Freq: Four times a day (QID) | RESPIRATORY_TRACT | 2 refills | Status: DC | PRN

## 2018-07-12 NOTE — Telephone Encounter (Signed)
rx ok'd for xopenex inhaler with 2 refills.

## 2018-08-03 ENCOUNTER — Other Ambulatory Visit: Payer: Self-pay | Admitting: Internal Medicine

## 2018-08-03 DIAGNOSIS — Z1231 Encounter for screening mammogram for malignant neoplasm of breast: Secondary | ICD-10-CM

## 2018-08-06 ENCOUNTER — Other Ambulatory Visit: Payer: Self-pay | Admitting: Internal Medicine

## 2018-08-08 NOTE — Telephone Encounter (Signed)
Refilled: 06/08/2018 Last OV: 05/10/2018 Next OV: 12/19/2018

## 2018-08-08 NOTE — Telephone Encounter (Signed)
Pt called in to check on status of this med?

## 2018-08-09 NOTE — Telephone Encounter (Signed)
rx ok'd for clonazepam #30 with one refill.   

## 2018-08-10 DIAGNOSIS — J984 Other disorders of lung: Secondary | ICD-10-CM | POA: Diagnosis not present

## 2018-08-10 DIAGNOSIS — J4530 Mild persistent asthma, uncomplicated: Secondary | ICD-10-CM | POA: Diagnosis not present

## 2018-08-15 ENCOUNTER — Ambulatory Visit (INDEPENDENT_AMBULATORY_CARE_PROVIDER_SITE_OTHER): Payer: Medicare Other

## 2018-08-15 DIAGNOSIS — Z1159 Encounter for screening for other viral diseases: Secondary | ICD-10-CM

## 2018-08-15 DIAGNOSIS — Z0000 Encounter for general adult medical examination without abnormal findings: Secondary | ICD-10-CM

## 2018-08-15 NOTE — Patient Instructions (Addendum)
  Ms. Gundy , Thank you for taking time to come for your Medicare Wellness Visit. I appreciate your ongoing commitment to your health goals. Please review the following plan we discussed and let me know if I can assist you in the future.   These are the goals we discussed: Goals      Patient Stated   . Increase physical activity (pt-stated)     Use stepper for exercise more often       This is a list of the screening recommended for you and due dates:  Health Maintenance  Topic Date Due  .  Hepatitis C: One time screening is recommended by Center for Disease Control  (CDC) for  adults born from [birth year] through 1963.   July 13, 1945  . Colon Cancer Screening  02/25/2017  . Tetanus Vaccine  08/15/2019*  . Flu Shot  08/20/2018  . Mammogram  09/09/2018  . DEXA scan (bone density measurement)  Completed  . Pneumonia vaccines  Discontinued  *Topic was postponed. The date shown is not the original due date.

## 2018-08-15 NOTE — Progress Notes (Signed)
Subjective:   Jennifer [name] is a 73 y.o. female who presents for Medicare Annual (Subsequent) preventive examination.  Review of Systems:  No ROS.  Medicare Wellness Virtual Visit.  Visual/audio telehealth visit, UTA vital signs.   See social history for additional risk factors.   Cardiac Risk Factors include: advanced age (>57men, >85 women);hypertension     Objective:     Vitals: There were no vitals taken for this visit.  There is no height or weight on file to calculate BMI.  Advanced Directives 08/15/2018 04/25/2018 04/19/2017 04/13/2017 06/23/2016 06/04/2015 08/26/2014  Does Patient Have a Medical Advance Directive? Yes No Yes Yes Yes Yes Yes  Type of Paramedic of [place];Living will - Living will Living will Living will;Healthcare Power of [place];Living will Living will  Does patient want to make changes to medical advance directive? No - Patient declined - No - Patient declined No - Patient declined No - Patient declined No - Patient declined -  Copy of [place] in Chart? No - copy requested - - - No - copy requested No - copy requested -    Tobacco Social History   Tobacco Use  Smoking Status Never Smoker  Smokeless Tobacco Never Used     Counseling given: Not Answered   Clinical Intake:  Pre-visit preparation completed: Yes        Diabetes: No  How often do you need to have someone help you when you read instructions, pamphlets, or other written materials from your doctor or pharmacy?: 1 - Never  Interpreter Needed?: No     Past Medical History:  Diagnosis Date  . Anxiety    panic attacks  . Arthritis   . Asthma   . Bronchitis   . Depression   . 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 Gastritis   . GERD (gastroesophageal reflux disease)   . Hypertension   . PONV (postoperative nausea and vomiting)   . Scoliosis   . Ulcer    Past Surgical History:   Procedure Laterality Date  . ABDOMINAL HYSTERECTOMY    . ANTERIOR CERVICAL DECOMP/DISCECTOMY FUSION N/A 04/19/2017   Procedure: Anterior Cervical Decompression Fusion Cervical Three-Four, Cervical Four-Five, Cervical Five-Six;  Surgeon: Kary Kos, MD;  Location: [facility];  Service: Neurosurgery;  Laterality: N/A;  Anterior Cervical Decompression Fusion Cervical Three-Four, Cervical Four-Five, Cervical Five-Six  . CHOLECYSTECTOMY  1996  . DILATION AND CURETTAGE OF UTERUS  1971  . LAPAROSCOPIC REMOVAL OF MESENTERIC MASS    . [place]  . PARTIAL HYSTERECTOMY  1981   prolapsed uterus  . TUBAL LIGATION  1978   Family History  Problem Relation Age of Onset  . Stroke Mother   . Cancer Mother        Brain   . CVA Maternal Grandmother   . Cancer Father   . Breast cancer Neg Hx   . Colon cancer Neg Hx    Social History   Socioeconomic History  . Marital status: Married    Spouse name: Not on file  . Number of children: 2  . Years of education: Not on file  . Highest education level: Not on file  Occupational History  . Occupation: retired Tour manager  . Financial resource strain: Not hard at all  . Food insecurity    Worry: Never true    Inability: Never  true  . Transportation needs    Medical: No    Non-medical: No  Tobacco Use  . Smoking status: Never Smoker  . Smokeless tobacco: Never Used  Substance and Sexual Activity  . Alcohol use: No    Alcohol/week: 0.0 standard drinks  . Drug use: No  . Sexual activity: Not on file  Lifestyle  . Physical activity    Days per week: Not on file    Minutes per session: Not on file  . Stress: Not at all  Relationships  . Social Herbalist on phone: Not on file    Gets together: Not on file    Attends religious service: Not on file    Active member of club or organization: Not on file    Attends meetings of clubs or organizations: Not on file    Relationship status: Not on file  Other  Topics Concern  . Not on file  Social History Narrative  . Not on file    Outpatient Encounter Medications as of 08/15/2018  Medication Sig  . citalopram (CELEXA) 40 MG tablet Take 1 tablet (40 mg total) by mouth daily.  . clonazePAM (KLONOPIN) 0.5 MG tablet TAKE ONE-HALF TO ONE TABLET AS NEEDED.  . fluticasone (FLONASE) 50 MCG/ACT nasal spray Place 2 sprays into the nose daily as needed for rhinitis.   . hydrochlorothiazide (HYDRODIURIL) 25 MG tablet Take 1 tablet (25 mg total) by mouth daily.  Marland Kitchen ibuprofen (ADVIL,MOTRIN) 200 MG tablet Take 200 mg by mouth 2 (two) times daily as needed for moderate pain.  Marland Kitchen ipratropium (ATROVENT) 0.02 % nebulizer solution Take 2.5 mLs (0.5 mg total) by nebulization every 6 (six) hours as needed for shortness of breath.  . levalbuterol (XOPENEX HFA) 45 MCG/ACT inhaler Inhale 1-2 puffs into the lungs every 6 (six) hours as needed for wheezing or shortness of breath.  . Multiple Vitamin (MULTIVITAMIN WITH MINERALS) TABS tablet Take 1 tablet by mouth daily.  . mupirocin ointment (BACTROBAN) 2 % Apply to affected area bid (Patient taking differently: Apply 1 application topically daily as needed (irritation). )  . pravastatin (PRAVACHOL) 20 MG tablet Take 1 tablet (20 mg total) by mouth daily.  . Probiotic Product (PROBIOTIC DAILY PO) Take 1 capsule by mouth daily.   Marland Kitchen tiotropium (SPIRIVA HANDIHALER) 18 MCG inhalation capsule Place 1 capsule (18 mcg total) into inhaler and inhale daily.   No facility-administered encounter medications on file as of 08/15/2018.     Activities of Daily Living In your present state of health, do you have any difficulty performing the following activities: 08/15/2018  Hearing? N  Vision? N  Difficulty concentrating or making decisions? N  Walking or climbing stairs? Y  Dressing or bathing? N  Doing errands, shopping? N  Preparing Food and eating ? N  Using the Toilet? N  In the past six months, have you accidently leaked urine?  N  Do you have problems with loss of bowel control? N  Managing your Medications? N  Managing your Finances? N  Housekeeping or managing your Housekeeping? N  Some recent data might be hidden    Patient Care Team: [name], MD as PCP - General (Internal Medicine)    Assessment:   This is a routine wellness examination for [name].  I connected with patient 08/15/18 at 11:30 AM EDT by an audio enabled telemedicine application and verified that I am speaking with the correct person using two identifiers. Patient stated full name and DOB.  Patient gave permission to continue with virtual visit. Patient's location was at home and Nurse's location was at [place] office.   Health Screenings  Mammogram - 08/2017; scheduled 08/2018 Colonoscopy - 02/2016 Bone Density - 07/2016 Glaucoma -none Hearing -demonstrates normal hearing during visit. Hemoglobin A1C - 02/2018 (5.5) Cholesterol -02/2018 Hepatitis C Screening- discussed; consent given.  Dental- visits every 6 months Vision- visits within the last 12 months.  Social  Alcohol intake - no     Smoking history- never   Smokers in home? none Illicit drug use? none Exercise - stepper Diet -regular Sexually Active -not currently BMI- discussed the importance of a healthy diet, water intake and the benefits of aerobic exercise.  Educational material provided.   Safety  Patient feels safe at home- yes Patient does have smoke detectors at home- yes Patient does wear sunscreen or protective clothing when in direct sunlight -yes Patient does wear seat belt when in a moving vehicle -yes Patient drives- yes  [id number] precautions and sickness symptoms discussed.   Activities of Daily Living Patient denies needing assistance with: driving, feeding themselves, getting from bed to chair, getting to the toilet, bathing/showering, dressing, managing money, or preparing meals.  Husband assists with household chores.   Oxygen in use at bedtime.    Depression Screen Patient denies losing interest in daily life, feeling hopeless, or crying easily over simple problems.   Medication-taking as directed and without issues.   Fall Screen Patient denies being afraid of falling or falling in the last year.   Memory Screen Patient is alert.  Patient denies difficulty focusing, concentrating or misplacing items. Correctly identified the president of the [place], season and recall. Patient likes to read for brain stimulation.  Immunizations The following Immunizations were discussed: Influenza, shingles, pneumonia, and tetanus.   Other Providers Patient Care Team: [name], MD as PCP - General (Internal Medicine)  Exercise Activities and Dietary recommendations    Goals      Patient Stated   . Increase physical activity (pt-stated)     Use stepper for exercise more often       Fall Risk Fall Risk  08/15/2018 02/12/2017 06/23/2016 11/11/2015 10/28/2015  Falls in the past year? 0 No No No No   Is the patient's home free of loose throw rugs in walkways, pet beds, electrical cords, etc? yes      Grab bars in the bathroom? yes      Handrails on the stairs? yes      Adequate lighting? yes  Depression Screen PHQ 2/9 Scores 08/15/2018 02/12/2017 06/23/2016 11/11/2015  PHQ - 2 Score 0 0 0 0     Cognitive Function MMSE - Mini Mental State Exam 06/23/2016  Orientation to time 5  Orientation to Place 5  Registration 3  Attention/ Calculation 5  Recall 3  Language- name 2 objects 2  Language- repeat 1  Language- follow 3 step command 3  Language- read & follow direction 1  Write a sentence 1  Copy design 1  Total score 30     6CIT Screen 08/15/2018  What Year? 0 points  What month? 0 points  What time? 0 points  Count back from 20 0 points  Months in reverse 0 points  Repeat phrase 0 points  Total Score 0    Immunization History  Administered Date(s) Administered  . Influenza Split 09/20/2013  . Influenza Whole  10/23/2016, 11/13/2016  . Influenza, High Dose Seasonal PF 12/20/2017  . Influenza-Unspecified 10/20/2011, 09/17/2014, 10/10/2015  .  Pneumococcal Conjugate-13 10/29/2016  . Zoster 10/19/2013, 05/16/2015  . Zoster Recombinat (Shingrix) 07/09/2017, 10/28/2017   Screening Tests Health Maintenance  Topic Date Due  . Hepatitis C Screening  1945-08-14  . COLONOSCOPY  02/25/2017  . TETANUS/TDAP  08/15/2019 (Originally 01/24/1964)  . INFLUENZA VACCINE  08/20/2018  . MAMMOGRAM  09/09/2018  . DEXA SCAN  Completed  . PNA vac Low Risk Adult  Discontinued      Plan:    End of life planning; Advance aging; Advanced directives discussed.  Copy of current HCPOA/Living Will requested.    I have personally reviewed and noted the following in the patient's chart:   . Medical and social history . Use of alcohol, tobacco or illicit drugs  . Current medications and supplements . Functional ability and status . Nutritional status . Physical activity . Advanced directives . List of other physicians . Hospitalizations, surgeries, and ER visits in previous 12 months . Vitals . Screenings to include cognitive, depression, and falls . Referrals and appointments  In addition, I have reviewed and discussed with patient certain preventive protocols, quality metrics, and best practice recommendations. A written personalized care plan for preventive services as well as general preventive health recommendations were provided to patient.     Varney Biles, LPN  9/43/2761   Reviewed above information.  Agree with assessment and plan.    Dr Nicki Reaper

## 2018-09-01 DIAGNOSIS — Z86018 Personal history of other benign neoplasm: Secondary | ICD-10-CM | POA: Diagnosis not present

## 2018-09-01 DIAGNOSIS — L578 Other skin changes due to chronic exposure to nonionizing radiation: Secondary | ICD-10-CM | POA: Diagnosis not present

## 2018-09-01 DIAGNOSIS — L821 Other seborrheic keratosis: Secondary | ICD-10-CM | POA: Diagnosis not present

## 2018-09-01 DIAGNOSIS — Z859 Personal history of malignant neoplasm, unspecified: Secondary | ICD-10-CM | POA: Diagnosis not present

## 2018-09-01 DIAGNOSIS — L570 Actinic keratosis: Secondary | ICD-10-CM | POA: Diagnosis not present

## 2018-09-01 DIAGNOSIS — Z872 Personal history of diseases of the skin and subcutaneous tissue: Secondary | ICD-10-CM | POA: Diagnosis not present

## 2018-09-05 ENCOUNTER — Other Ambulatory Visit: Payer: Self-pay | Admitting: Internal Medicine

## 2018-09-12 ENCOUNTER — Ambulatory Visit
Admission: RE | Admit: 2018-09-12 | Discharge: 2018-09-12 | Disposition: A | Discharge status: Home / Self Care | Payer: Medicare Other | Source: Ambulatory Visit | Attending: Internal Medicine | Admitting: Internal Medicine

## 2018-09-12 DIAGNOSIS — Z1231 Encounter for screening mammogram for malignant neoplasm of breast: Secondary | ICD-10-CM | POA: Diagnosis not present

## 2018-09-20 DIAGNOSIS — M1712 Unilateral primary osteoarthritis, left knee: Secondary | ICD-10-CM | POA: Diagnosis not present

## 2018-10-02 ENCOUNTER — Other Ambulatory Visit: Payer: Self-pay | Admitting: Internal Medicine

## 2018-10-17 ENCOUNTER — Ambulatory Visit: Payer: Medicare Other

## 2018-11-04 ENCOUNTER — Other Ambulatory Visit: Payer: Self-pay | Admitting: Internal Medicine

## 2018-11-10 DIAGNOSIS — M542 Cervicalgia: Secondary | ICD-10-CM | POA: Diagnosis not present

## 2018-11-14 DIAGNOSIS — J449 Chronic obstructive pulmonary disease, unspecified: Secondary | ICD-10-CM | POA: Diagnosis not present

## 2018-11-22 ENCOUNTER — Other Ambulatory Visit: Payer: Self-pay | Admitting: Neurosurgery

## 2018-11-22 DIAGNOSIS — M542 Cervicalgia: Secondary | ICD-10-CM

## 2018-11-29 ENCOUNTER — Ambulatory Visit
Admission: RE | Admit: 2018-11-29 | Discharge: 2018-11-29 | Disposition: A | Discharge status: Home / Self Care | Payer: Medicare Other | Source: Ambulatory Visit | Attending: Neurosurgery | Admitting: Neurosurgery

## 2018-11-29 DIAGNOSIS — M50223 Other cervical disc displacement at C6-C7 level: Secondary | ICD-10-CM | POA: Diagnosis not present

## 2018-11-29 DIAGNOSIS — M4322 Fusion of spine, cervical region: Secondary | ICD-10-CM | POA: Diagnosis not present

## 2018-11-29 DIAGNOSIS — M542 Cervicalgia: Secondary | ICD-10-CM

## 2018-12-01 ENCOUNTER — Other Ambulatory Visit: Payer: Self-pay

## 2018-12-01 DIAGNOSIS — Z20822 Contact with and (suspected) exposure to covid-19: Secondary | ICD-10-CM

## 2018-12-03 ENCOUNTER — Telehealth: Payer: Self-pay

## 2018-12-03 LAB — NOVEL CORONAVIRUS, NAA: SARS-CoV-2, NAA: NOT DETECTED

## 2018-12-03 NOTE — Telephone Encounter (Signed)
Pt called for covid test results advised that results are not back. Enrolled pt with MyChart.

## 2018-12-07 ENCOUNTER — Other Ambulatory Visit: Payer: Self-pay | Admitting: Internal Medicine

## 2018-12-07 NOTE — Telephone Encounter (Signed)
Patient calling to check status. States that this is the 2nd request that was sent. Advised that I was not seeing any other request for this medication. States that she only has 1 day left on the current fill. Please advise.

## 2018-12-08 DIAGNOSIS — M542 Cervicalgia: Secondary | ICD-10-CM | POA: Diagnosis not present

## 2018-12-15 DIAGNOSIS — J449 Chronic obstructive pulmonary disease, unspecified: Secondary | ICD-10-CM | POA: Diagnosis not present

## 2018-12-19 ENCOUNTER — Encounter: Payer: Medicare Other | Admitting: Internal Medicine

## 2018-12-28 ENCOUNTER — Telehealth: Payer: Self-pay | Admitting: Internal Medicine

## 2018-12-28 DIAGNOSIS — Z0279 Encounter for issue of other medical certificate: Secondary | ICD-10-CM

## 2018-12-28 NOTE — Telephone Encounter (Signed)
Form filled out and placed in quick sign

## 2018-12-28 NOTE — Telephone Encounter (Signed)
Pt came in and drop off parking decal form that needs to be filled out. Form is in color folder up front. Thank you!

## 2018-12-28 NOTE — Telephone Encounter (Signed)
Signed and placed on your desk.

## 2018-12-29 NOTE — Telephone Encounter (Signed)
Patient has picked form up.

## 2018-12-29 NOTE — Telephone Encounter (Signed)
Left message for pt. Need to know if she wants mailed or picked up.

## 2019-01-09 ENCOUNTER — Other Ambulatory Visit: Payer: Self-pay | Admitting: Internal Medicine

## 2019-01-14 DIAGNOSIS — J449 Chronic obstructive pulmonary disease, unspecified: Secondary | ICD-10-CM | POA: Diagnosis not present

## 2019-01-25 ENCOUNTER — Other Ambulatory Visit: Payer: Self-pay | Admitting: Internal Medicine

## 2019-01-27 NOTE — Telephone Encounter (Signed)
Refilled: 08/08/2000 Last OV: 05/10/2018 Next OV: 02/03/2019

## 2019-01-28 NOTE — Telephone Encounter (Signed)
rx ok'd for clonazepam #30 with one refill.   

## 2019-01-30 ENCOUNTER — Other Ambulatory Visit: Payer: Self-pay

## 2019-02-03 ENCOUNTER — Other Ambulatory Visit: Payer: Self-pay

## 2019-02-03 ENCOUNTER — Ambulatory Visit (INDEPENDENT_AMBULATORY_CARE_PROVIDER_SITE_OTHER): Payer: Medicare Other | Admitting: Internal Medicine

## 2019-02-03 DIAGNOSIS — R413 Other amnesia: Secondary | ICD-10-CM

## 2019-02-03 DIAGNOSIS — I10 Essential (primary) hypertension: Secondary | ICD-10-CM

## 2019-02-03 DIAGNOSIS — M25569 Pain in unspecified knee: Secondary | ICD-10-CM

## 2019-02-03 DIAGNOSIS — E7800 Pure hypercholesterolemia, unspecified: Secondary | ICD-10-CM | POA: Diagnosis not present

## 2019-02-03 DIAGNOSIS — J984 Other disorders of lung: Secondary | ICD-10-CM

## 2019-02-03 DIAGNOSIS — J4520 Mild intermittent asthma, uncomplicated: Secondary | ICD-10-CM | POA: Diagnosis not present

## 2019-02-03 DIAGNOSIS — R739 Hyperglycemia, unspecified: Secondary | ICD-10-CM | POA: Diagnosis not present

## 2019-02-03 NOTE — Progress Notes (Signed)
[name], female   DOB: Nov 22, 1945, 74 y.o.   MRN: [id number]   Virtual Visit via video Note  This visit type was conducted due to national recommendations for restrictions regarding the COVID-19 pandemic (e.g. social distancing).  This format is felt to be most appropriate for this patient at this time.  All issues noted in this document were discussed and addressed.  No physical exam was performed (except for noted visual exam findings with Video Visits).   I connected with Jennifer [name] by a video enabled telemedicine application and verified that I am speaking with the correct person using two identifiers. Location patient: home Location provider: work  Persons participating in the virtual visit: patient, provider  The limitations, risks, security and privacy concerns of performing an evaluation and management service by telephone and the availability of in person appointments have been discussed.  The patient expressed understanding and agreed to proceed.  Reason for visit: scheduled follow up.   HPI: She reports she is doing relatively well.  Breathing is doing well.  No cough, congestion or increased sob.  Using spiriva daily.  No acid reflux reported.  No abdominal pain or bowel change reported.  Taking metamucil.  This has helped her bowels.  No chest pain.  Some arthritis - knee.  Does affect her walking.  Describes stiffness and aching.  Desires no further intervention at this time.  Has noticed some memory issues.  Mild.  States she is staying in.  Not as much stimulation.  Discussed may be more of a focus issue or concentration issue.     ROS: See pertinent positives and negatives per HPI.  Past Medical History:  Diagnosis Date  . Anxiety    panic attacks  . Arthritis   . Asthma   . Bronchitis   . Depression   . 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 Gastritis   . GERD (gastroesophageal reflux disease)   . Hypertension   . PONV  (postoperative nausea and vomiting)   . Scoliosis   . Ulcer     Past Surgical History:  Procedure Laterality Date  . ABDOMINAL HYSTERECTOMY    . ANTERIOR CERVICAL DECOMP/DISCECTOMY FUSION N/A 04/19/2017   Procedure: Anterior Cervical Decompression Fusion Cervical Three-Four, Cervical Four-Five, Cervical Five-Six;  Surgeon: Kary Kos, MD;  Location: [facility];  Service: Neurosurgery;  Laterality: N/A;  Anterior Cervical Decompression Fusion Cervical Three-Four, Cervical Four-Five, Cervical Five-Six  . CHOLECYSTECTOMY  1996  . DILATION AND CURETTAGE OF UTERUS  1971  . LAPAROSCOPIC REMOVAL OF MESENTERIC MASS    . [place]  . PARTIAL HYSTERECTOMY  1981   prolapsed uterus  . TUBAL LIGATION  1978    Family History  Problem Relation Age of Onset  . Stroke Mother   . Cancer Mother        Brain   . CVA Maternal Grandmother   . Cancer Father   . Breast cancer Neg Hx   . Colon cancer Neg Hx     SOCIAL HX: reviewed.    Current Outpatient Medications:  .  citalopram (CELEXA) 40 MG tablet, Take 1 tablet (40 mg total) by mouth daily., Disp: 90 tablet, Rfl: 1 .  clonazePAM (KLONOPIN) 0.5 MG tablet, TAKE ONE-HALF TO ONE TABLET DAILY AS NEEDED., Disp: 30 tablet, Rfl: 1 .  fluticasone (FLONASE) 50 MCG/ACT nasal spray, Place 2 sprays into the nose daily as needed  for rhinitis. , Disp: , Rfl:  .  hydrochlorothiazide (HYDRODIURIL) 25 MG tablet, Take 1 tablet (25 mg total) by mouth daily., Disp: 90 tablet, Rfl: 1 .  ibuprofen (ADVIL,MOTRIN) 200 MG tablet, Take 200 mg by mouth 2 (two) times daily as needed for moderate pain., Disp: , Rfl:  .  ipratropium (ATROVENT) 0.02 % nebulizer solution, Take 2.5 mLs (0.5 mg total) by nebulization every 6 (six) hours as needed for shortness of breath., Disp: 75 mL, Rfl: 1 .  levalbuterol (XOPENEX HFA) 45 MCG/ACT inhaler, Inhale 1-2 puffs into the lungs every 6 (six) hours as needed for wheezing or shortness of breath., Disp: 1 Inhaler, Rfl:  2 .  Multiple Vitamin (MULTIVITAMIN WITH MINERALS) TABS tablet, Take 1 tablet by mouth daily., Disp: , Rfl:  .  mupirocin ointment (BACTROBAN) 2 %, Apply to affected area bid (Patient taking differently: Apply 1 application topically daily as needed (irritation). ), Disp: 22 g, Rfl: 0 .  pravastatin (PRAVACHOL) 20 MG tablet, Take 1 tablet (20 mg total) by mouth daily., Disp: 90 tablet, Rfl: 3 .  Probiotic Product (PROBIOTIC DAILY PO), Take 1 capsule by mouth daily. , Disp: , Rfl:  .  SPIRIVA HANDIHALER 18 MCG inhalation capsule, Place 1 capsule (18 mcg total) into inhaler and inhale daily., Disp: 30 capsule, Rfl: 0  EXAM:  GENERAL: alert, oriented, appears well and in no acute distress  HEENT: atraumatic, conjunttiva clear, no obvious abnormalities on inspection of external nose and ears  NECK: normal movements of the head and neck  LUNGS: on inspection no signs of respiratory distress, breathing rate appears normal, no obvious gross SOB, gasping or wheezing  CV: no obvious cyanosis  PSYCH/NEURO: pleasant and cooperative, no obvious depression or anxiety, speech and thought processing grossly intact  ASSESSMENT AND PLAN:  Discussed the following assessment and plan:  Asthma Breathing stable.  Continue spiriva.    Chronic restrictive lung disease Breathing stable.  Has been followed by pulmonary.   Essential hypertension, benign Blood pressure has been under good control.  Follow pressures.  Follow metabolic panel.   Hypercholesterolemia On pravastatin.  Low cholesterol diet and exercise.  Follow lipid panel and liver function tests.    Hyperglycemia Low carb diet and exercise.  Follow met b and a1c.   Knee pain Knee pain as outlined.  Will notify me if desires any further intervention.  Follow.    Memory change Mild changes as outlined.  Discussed getting out.  Discussed brain stimulation.  Will check B12 and routine labs.     Orders Placed This Encounter  Procedures   . CBC with Differential/Platelet    Standing Status:   Future    Standing Expiration Date:   02/04/2020  . Hemoglobin A1c    Standing Status:   Future    Standing Expiration Date:   02/04/2020  . Hepatic function panel    Standing Status:   Future    Standing Expiration Date:   02/04/2020  . Lipid panel    Standing Status:   Future    Standing Expiration Date:   02/04/2020  . TSH    Standing Status:   Future    Standing Expiration Date:   02/04/2020  . Basic metabolic panel (future)    Standing Status:   Future    Standing Expiration Date:   02/04/2020  . B12    Standing Status:   Future    Standing Expiration Date:   02/04/2020     I discussed  the assessment and treatment plan with the patient. The patient was provided an opportunity to ask questions and all were answered. The patient agreed with the plan and demonstrated an understanding of the instructions.   The patient was advised to call back or seek an in-person evaluation if the symptoms worsen or if the condition fails to improve as anticipated.   [name], MD

## 2019-02-04 ENCOUNTER — Encounter: Payer: Self-pay | Admitting: Internal Medicine

## 2019-02-04 DIAGNOSIS — R413 Other amnesia: Secondary | ICD-10-CM | POA: Insufficient documentation

## 2019-02-04 DIAGNOSIS — M25569 Pain in unspecified knee: Secondary | ICD-10-CM | POA: Insufficient documentation

## 2019-02-04 NOTE — Assessment & Plan Note (Signed)
Blood pressure has been under good control.  Follow pressures.  Follow metabolic panel.  

## 2019-02-04 NOTE — Assessment & Plan Note (Signed)
Breathing stable.  Continue spiriva.

## 2019-02-04 NOTE — Assessment & Plan Note (Signed)
On pravastatin.  Low cholesterol diet and exercise.  Follow lipid panel and liver function tests.   

## 2019-02-04 NOTE — Assessment & Plan Note (Signed)
Mild changes as outlined.  Discussed getting out.  Discussed brain stimulation.  Will check B12 and routine labs.

## 2019-02-04 NOTE — Assessment & Plan Note (Signed)
Low carb diet and exercise.  Follow met b and a1c.  

## 2019-02-04 NOTE — Assessment & Plan Note (Signed)
Knee pain as outlined.  Will notify me if desires any further intervention.  Follow.

## 2019-02-04 NOTE — Assessment & Plan Note (Signed)
Breathing stable.  Has been followed by pulmonary.  

## 2019-02-05 ENCOUNTER — Other Ambulatory Visit: Payer: Self-pay | Admitting: Internal Medicine

## 2019-02-14 DIAGNOSIS — J449 Chronic obstructive pulmonary disease, unspecified: Secondary | ICD-10-CM | POA: Diagnosis not present

## 2019-02-16 ENCOUNTER — Other Ambulatory Visit: Payer: Self-pay | Admitting: Internal Medicine

## 2019-02-28 ENCOUNTER — Other Ambulatory Visit: Payer: Self-pay

## 2019-03-02 ENCOUNTER — Other Ambulatory Visit: Payer: Self-pay

## 2019-03-02 ENCOUNTER — Other Ambulatory Visit (INDEPENDENT_AMBULATORY_CARE_PROVIDER_SITE_OTHER): Payer: Medicare Other

## 2019-03-02 DIAGNOSIS — Z1159 Encounter for screening for other viral diseases: Secondary | ICD-10-CM | POA: Diagnosis not present

## 2019-03-02 DIAGNOSIS — R739 Hyperglycemia, unspecified: Secondary | ICD-10-CM

## 2019-03-02 DIAGNOSIS — R413 Other amnesia: Secondary | ICD-10-CM | POA: Diagnosis not present

## 2019-03-02 DIAGNOSIS — I10 Essential (primary) hypertension: Secondary | ICD-10-CM | POA: Diagnosis not present

## 2019-03-02 DIAGNOSIS — E7800 Pure hypercholesterolemia, unspecified: Secondary | ICD-10-CM | POA: Diagnosis not present

## 2019-03-02 LAB — HEPATIC FUNCTION PANEL
ALT: 12 U/L (ref 0–35)
AST: 20 U/L (ref 0–37)
Albumin: 4.2 g/dL (ref 3.5–5.2)
Alkaline Phosphatase: 54 U/L (ref 39–117)
Bilirubin, Direct: 0.1 mg/dL (ref 0.0–0.3)
Total Bilirubin: 0.6 mg/dL (ref 0.2–1.2)
Total Protein: 6.6 g/dL (ref 6.0–8.3)

## 2019-03-02 LAB — CBC WITH DIFFERENTIAL/PLATELET
Basophils Absolute: 0.1 10*3/uL (ref 0.0–0.1)
Basophils Relative: 1.4 % (ref 0.0–3.0)
Eosinophils Absolute: 0.4 10*3/uL (ref 0.0–0.7)
Eosinophils Relative: 5.7 % — ABNORMAL HIGH (ref 0.0–5.0)
HCT: 41.9 % (ref 36.0–46.0)
Hemoglobin: 14.1 g/dL (ref 12.0–15.0)
Lymphocytes Relative: 28.6 % (ref 12.0–46.0)
Lymphs Abs: 2 10*3/uL (ref 0.7–4.0)
MCHC: 33.7 g/dL (ref 30.0–36.0)
MCV: 94 fl (ref 78.0–100.0)
Monocytes Absolute: 0.5 10*3/uL (ref 0.1–1.0)
Monocytes Relative: 7.5 % (ref 3.0–12.0)
Neutro Abs: 3.9 10*3/uL (ref 1.4–7.7)
Neutrophils Relative %: 56.8 % (ref 43.0–77.0)
Platelets: 211 10*3/uL (ref 150.0–400.0)
RBC: 4.45 Mil/uL (ref 3.87–5.11)
RDW: 13 % (ref 11.5–15.5)
WBC: 6.9 10*3/uL (ref 4.0–10.5)

## 2019-03-02 LAB — BASIC METABOLIC PANEL
BUN: 10 mg/dL (ref 6–23)
CO2: 34 mEq/L — ABNORMAL HIGH (ref 19–32)
Calcium: 9.5 mg/dL (ref 8.4–10.5)
Chloride: 100 mEq/L (ref 96–112)
Creatinine, Ser: 0.65 mg/dL (ref 0.40–1.20)
GFR: 89.57 mL/min (ref >60.00)
Glucose, Bld: 92 mg/dL (ref 70–99)
Potassium: 3.5 mEq/L (ref 3.5–5.1)
Sodium: 139 mEq/L (ref 135–145)

## 2019-03-02 LAB — LIPID PANEL
Cholesterol: 167 mg/dL (ref 0–200)
HDL: 60.8 mg/dL (ref >39.00)
LDL Cholesterol: 91 mg/dL (ref 0–99)
NonHDL: 106.07
Total CHOL/HDL Ratio: 3
Triglycerides: 76 mg/dL (ref 0.0–149.0)
VLDL: 15.2 mg/dL (ref 0.0–40.0)

## 2019-03-02 LAB — HEMOGLOBIN A1C: Hgb A1c MFr Bld: 5.8 % (ref 4.6–6.5)

## 2019-03-02 LAB — TSH: TSH: 1.28 u[IU]/mL (ref 0.35–4.50)

## 2019-03-02 LAB — VITAMIN B12: Vitamin B-12: 160 pg/mL — ABNORMAL LOW (ref 211–911)

## 2019-03-03 LAB — HEPATITIS C ANTIBODY
Hepatitis C Ab: NONREACTIVE
SIGNAL TO CUT-OFF: 0.01 (ref <1.00)

## 2019-03-06 ENCOUNTER — Telehealth: Payer: Self-pay | Admitting: Internal Medicine

## 2019-03-06 NOTE — Telephone Encounter (Signed)
See result note.  

## 2019-03-06 NOTE — Telephone Encounter (Signed)
Pt called returning a call about lab results  

## 2019-03-09 ENCOUNTER — Other Ambulatory Visit: Payer: Self-pay | Admitting: Internal Medicine

## 2019-03-17 DIAGNOSIS — J449 Chronic obstructive pulmonary disease, unspecified: Secondary | ICD-10-CM | POA: Diagnosis not present

## 2019-03-30 ENCOUNTER — Ambulatory Visit (INDEPENDENT_AMBULATORY_CARE_PROVIDER_SITE_OTHER): Payer: Medicare Other

## 2019-03-30 ENCOUNTER — Other Ambulatory Visit: Payer: Self-pay

## 2019-03-30 DIAGNOSIS — E538 Deficiency of other specified B group vitamins: Secondary | ICD-10-CM

## 2019-03-30 MED ORDER — CYANOCOBALAMIN 1000 MCG/ML IJ SOLN
1000.0000 ug | Freq: Once | INTRAMUSCULAR | Status: AC
  Administered 2019-03-30: 1000 ug via INTRAMUSCULAR

## 2019-03-30 NOTE — Progress Notes (Addendum)
[name] presents today for injection per MD orders. B12 injection  administered IM in left Upper Arm. Administration without incident. Patient tolerated well.  Rasool Rommel,cma   Reviewed Dr Nicki Reaper

## 2019-04-06 ENCOUNTER — Ambulatory Visit: Payer: Medicare Other

## 2019-04-08 ENCOUNTER — Other Ambulatory Visit: Payer: Self-pay | Admitting: Internal Medicine

## 2019-04-11 ENCOUNTER — Ambulatory Visit: Payer: Medicare Other

## 2019-04-11 ENCOUNTER — Other Ambulatory Visit: Payer: Self-pay

## 2019-04-11 ENCOUNTER — Ambulatory Visit (INDEPENDENT_AMBULATORY_CARE_PROVIDER_SITE_OTHER): Payer: Medicare Other | Admitting: *Deleted

## 2019-04-11 DIAGNOSIS — E538 Deficiency of other specified B group vitamins: Secondary | ICD-10-CM

## 2019-04-11 MED ORDER — CYANOCOBALAMIN 1000 MCG/ML IJ SOLN
1000.0000 ug | Freq: Once | INTRAMUSCULAR | Status: AC
  Administered 2019-04-11: 1000 ug via INTRAMUSCULAR

## 2019-04-11 NOTE — Progress Notes (Addendum)
Patient presented for B 12 injection to right deltoid, patient voiced no concerns nor showed any signs of distress during injection.  Reviewed.  Dr Scott 

## 2019-04-13 ENCOUNTER — Ambulatory Visit: Payer: Medicare Other

## 2019-04-14 DIAGNOSIS — J449 Chronic obstructive pulmonary disease, unspecified: Secondary | ICD-10-CM | POA: Diagnosis not present

## 2019-04-18 ENCOUNTER — Ambulatory Visit (INDEPENDENT_AMBULATORY_CARE_PROVIDER_SITE_OTHER): Payer: Medicare Other

## 2019-04-18 ENCOUNTER — Other Ambulatory Visit: Payer: Self-pay

## 2019-04-18 DIAGNOSIS — E538 Deficiency of other specified B group vitamins: Secondary | ICD-10-CM

## 2019-04-18 MED ORDER — CYANOCOBALAMIN 1000 MCG/ML IJ SOLN
1000.0000 ug | Freq: Once | INTRAMUSCULAR | Status: AC
  Administered 2019-04-18: 1000 ug via INTRAMUSCULAR

## 2019-04-18 NOTE — Progress Notes (Addendum)
Patient presented for B 12 injection to left deltoid, patient voiced no concerns nor showed any signs of distress during injection.  Reviewed.  Dr Scott 

## 2019-04-26 ENCOUNTER — Ambulatory Visit: Payer: Medicare Other

## 2019-04-27 ENCOUNTER — Other Ambulatory Visit: Payer: Self-pay

## 2019-04-27 ENCOUNTER — Ambulatory Visit (INDEPENDENT_AMBULATORY_CARE_PROVIDER_SITE_OTHER): Payer: Medicare Other

## 2019-04-27 DIAGNOSIS — E538 Deficiency of other specified B group vitamins: Secondary | ICD-10-CM | POA: Diagnosis not present

## 2019-04-27 MED ORDER — CYANOCOBALAMIN 1000 MCG/ML IJ SOLN
1000.0000 ug | Freq: Once | INTRAMUSCULAR | Status: AC
  Administered 2019-04-27: 1000 ug via INTRAMUSCULAR

## 2019-04-27 NOTE — Progress Notes (Addendum)
Patient presented for B 12 injection to left deltoid, patient voiced no concerns nor showed any signs of distress during injection.  Reviewed.  Dr Scott 

## 2019-05-02 DIAGNOSIS — M170 Bilateral primary osteoarthritis of knee: Secondary | ICD-10-CM | POA: Diagnosis not present

## 2019-05-08 ENCOUNTER — Other Ambulatory Visit: Payer: Self-pay | Admitting: Internal Medicine

## 2019-05-15 DIAGNOSIS — J449 Chronic obstructive pulmonary disease, unspecified: Secondary | ICD-10-CM | POA: Diagnosis not present

## 2019-05-18 ENCOUNTER — Other Ambulatory Visit: Payer: Self-pay | Admitting: Internal Medicine

## 2019-05-18 NOTE — Telephone Encounter (Signed)
Refill request for klonopin, last seen 02-03-19, last filled 01-28-19.  Please advise.

## 2019-05-19 NOTE — Telephone Encounter (Signed)
rx sent in for clonazepam 330 with one refill.

## 2019-05-31 ENCOUNTER — Ambulatory Visit (INDEPENDENT_AMBULATORY_CARE_PROVIDER_SITE_OTHER): Payer: Medicare Other

## 2019-05-31 ENCOUNTER — Other Ambulatory Visit: Payer: Self-pay

## 2019-05-31 DIAGNOSIS — E538 Deficiency of other specified B group vitamins: Secondary | ICD-10-CM | POA: Diagnosis not present

## 2019-05-31 MED ORDER — CYANOCOBALAMIN 1000 MCG/ML IJ SOLN
1000.0000 ug | Freq: Once | INTRAMUSCULAR | Status: AC
  Administered 2019-05-31: 1000 ug via INTRAMUSCULAR

## 2019-05-31 NOTE — Progress Notes (Addendum)
Patient presented for B 12 injection to left deltoid, patient voiced no concerns nor showed any signs of distress during injection.  Reviewed.  Dr Scott 

## 2019-06-02 ENCOUNTER — Other Ambulatory Visit: Payer: Self-pay | Admitting: Internal Medicine

## 2019-06-06 ENCOUNTER — Other Ambulatory Visit: Payer: Self-pay | Admitting: Internal Medicine

## 2019-06-09 DIAGNOSIS — M542 Cervicalgia: Secondary | ICD-10-CM | POA: Diagnosis not present

## 2019-06-14 ENCOUNTER — Ambulatory Visit: Payer: Medicare Other | Admitting: Internal Medicine

## 2019-06-14 DIAGNOSIS — J449 Chronic obstructive pulmonary disease, unspecified: Secondary | ICD-10-CM | POA: Diagnosis not present

## 2019-06-20 ENCOUNTER — Telehealth: Payer: Self-pay | Admitting: Internal Medicine

## 2019-06-20 DIAGNOSIS — J4521 Mild intermittent asthma with (acute) exacerbation: Secondary | ICD-10-CM

## 2019-06-20 DIAGNOSIS — J984 Other disorders of lung: Secondary | ICD-10-CM

## 2019-06-20 MED ORDER — IPRATROPIUM BROMIDE 0.02 % IN SOLN: 0.5000 mg | Freq: Four times a day (QID) | RESPIRATORY_TRACT | 1 refills | Status: DC | PRN

## 2019-06-20 NOTE — Telephone Encounter (Signed)
Pt needs ipratropium filled. Pt is afraid not to have it on hand if she has a flare up.

## 2019-06-29 ENCOUNTER — Other Ambulatory Visit: Payer: Self-pay

## 2019-06-29 ENCOUNTER — Ambulatory Visit (INDEPENDENT_AMBULATORY_CARE_PROVIDER_SITE_OTHER): Payer: Medicare Other

## 2019-06-29 DIAGNOSIS — E538 Deficiency of other specified B group vitamins: Secondary | ICD-10-CM | POA: Diagnosis not present

## 2019-06-29 MED ORDER — CYANOCOBALAMIN 1000 MCG/ML IJ SOLN
1000.0000 ug | Freq: Once | INTRAMUSCULAR | Status: AC
  Administered 2019-06-29: 1000 ug via INTRAMUSCULAR

## 2019-06-29 NOTE — Progress Notes (Addendum)
Patient presented for B 12 injection to left deltoid, patient voiced no concerns nor showed any signs of distress during injection.  Reviewed.  Dr Scott 

## 2019-07-10 ENCOUNTER — Other Ambulatory Visit: Payer: Self-pay | Admitting: Internal Medicine

## 2019-07-10 ENCOUNTER — Telehealth: Payer: Self-pay | Admitting: Internal Medicine

## 2019-07-10 NOTE — Telephone Encounter (Signed)
Pt canceled follow up appt on 07/12/19 to go to the beach. There are no avail appts for office visit until Sept. Pt wants to know if she needs to come in to get b12 checked? Please advise

## 2019-07-11 NOTE — Telephone Encounter (Signed)
Patient was calling to schedule a b12 injection. Scheduled for [place]. Patient was also scheduled for fasting labs and a follow up with Dr Nicki Reaper in July.

## 2019-07-12 ENCOUNTER — Ambulatory Visit: Payer: Medicare Other | Admitting: Internal Medicine

## 2019-07-15 DIAGNOSIS — J449 Chronic obstructive pulmonary disease, unspecified: Secondary | ICD-10-CM | POA: Diagnosis not present

## 2019-07-20 DIAGNOSIS — H2513 Age-related nuclear cataract, bilateral: Secondary | ICD-10-CM | POA: Diagnosis not present

## 2019-08-01 ENCOUNTER — Other Ambulatory Visit: Payer: Self-pay

## 2019-08-01 ENCOUNTER — Ambulatory Visit (INDEPENDENT_AMBULATORY_CARE_PROVIDER_SITE_OTHER): Payer: Medicare Other

## 2019-08-01 DIAGNOSIS — E538 Deficiency of other specified B group vitamins: Secondary | ICD-10-CM

## 2019-08-01 MED ORDER — CYANOCOBALAMIN 1000 MCG/ML IJ SOLN
1000.0000 ug | Freq: Once | INTRAMUSCULAR | Status: AC
  Administered 2019-08-01: 1000 ug via INTRAMUSCULAR

## 2019-08-01 NOTE — Progress Notes (Signed)
Patient presented for B 12 injection to left deltoid, patient voiced no concerns nor showed any signs of distress during injection. 

## 2019-08-09 ENCOUNTER — Other Ambulatory Visit: Payer: Self-pay | Admitting: Internal Medicine

## 2019-08-11 ENCOUNTER — Telehealth: Payer: Self-pay | Admitting: *Deleted

## 2019-08-11 DIAGNOSIS — R739 Hyperglycemia, unspecified: Secondary | ICD-10-CM

## 2019-08-11 DIAGNOSIS — I10 Essential (primary) hypertension: Secondary | ICD-10-CM

## 2019-08-11 DIAGNOSIS — E7800 Pure hypercholesterolemia, unspecified: Secondary | ICD-10-CM

## 2019-08-11 NOTE — Telephone Encounter (Signed)
Please place future orders for lab appt.  

## 2019-08-11 NOTE — Telephone Encounter (Signed)
Orders placed for labs

## 2019-08-14 ENCOUNTER — Other Ambulatory Visit (INDEPENDENT_AMBULATORY_CARE_PROVIDER_SITE_OTHER): Payer: Medicare Other

## 2019-08-14 ENCOUNTER — Other Ambulatory Visit: Payer: Self-pay

## 2019-08-14 DIAGNOSIS — I10 Essential (primary) hypertension: Secondary | ICD-10-CM | POA: Diagnosis not present

## 2019-08-14 DIAGNOSIS — R739 Hyperglycemia, unspecified: Secondary | ICD-10-CM

## 2019-08-14 DIAGNOSIS — J449 Chronic obstructive pulmonary disease, unspecified: Secondary | ICD-10-CM | POA: Diagnosis not present

## 2019-08-14 DIAGNOSIS — E7800 Pure hypercholesterolemia, unspecified: Secondary | ICD-10-CM

## 2019-08-14 LAB — HEPATIC FUNCTION PANEL
ALT: 12 U/L (ref 0–35)
AST: 18 U/L (ref 0–37)
Albumin: 4.2 g/dL (ref 3.5–5.2)
Alkaline Phosphatase: 52 U/L (ref 39–117)
Bilirubin, Direct: 0.1 mg/dL (ref 0.0–0.3)
Total Bilirubin: 0.5 mg/dL (ref 0.2–1.2)
Total Protein: 6.3 g/dL (ref 6.0–8.3)

## 2019-08-14 LAB — LIPID PANEL
Cholesterol: 180 mg/dL (ref 0–200)
HDL: 60.9 mg/dL (ref >39.00)
LDL Cholesterol: 92 mg/dL (ref 0–99)
NonHDL: 119.2
Total CHOL/HDL Ratio: 3
Triglycerides: 136 mg/dL (ref 0.0–149.0)
VLDL: 27.2 mg/dL (ref 0.0–40.0)

## 2019-08-14 LAB — BASIC METABOLIC PANEL
BUN: 11 mg/dL (ref 6–23)
CO2: 34 mEq/L — ABNORMAL HIGH (ref 19–32)
Calcium: 9.5 mg/dL (ref 8.4–10.5)
Chloride: 101 mEq/L (ref 96–112)
Creatinine, Ser: 0.64 mg/dL (ref 0.40–1.20)
GFR: 91.08 mL/min (ref >60.00)
Glucose, Bld: 96 mg/dL (ref 70–99)
Potassium: 3.9 mEq/L (ref 3.5–5.1)
Sodium: 140 mEq/L (ref 135–145)

## 2019-08-14 LAB — HEMOGLOBIN A1C: Hgb A1c MFr Bld: 5.5 % (ref 4.6–6.5)

## 2019-08-15 DIAGNOSIS — J4530 Mild persistent asthma, uncomplicated: Secondary | ICD-10-CM | POA: Diagnosis not present

## 2019-08-16 ENCOUNTER — Other Ambulatory Visit: Payer: Self-pay | Admitting: Internal Medicine

## 2019-08-16 ENCOUNTER — Ambulatory Visit (INDEPENDENT_AMBULATORY_CARE_PROVIDER_SITE_OTHER): Payer: Medicare Other

## 2019-08-16 VITALS — Ht 61.0 in | Wt 147.0 lb

## 2019-08-16 DIAGNOSIS — Z0000 Encounter for general adult medical examination without abnormal findings: Secondary | ICD-10-CM

## 2019-08-16 DIAGNOSIS — Z1231 Encounter for screening mammogram for malignant neoplasm of breast: Secondary | ICD-10-CM

## 2019-08-16 NOTE — Patient Instructions (Addendum)
Jennifer [name] , Thank you for taking time to come for your Medicare Wellness Visit. I appreciate your ongoing commitment to your health goals. Please review the following plan we discussed and let me know if I can assist you in the future.   These are the goals we discussed: Goals      Patient Stated   .  Increase physical activity (pt-stated)      Walk for exercise as tolerated       This is a list of the screening recommended for you and due dates:  Health Maintenance  Topic Date Due  . Tetanus Vaccine  Never done  . Colon Cancer Screening  02/25/2017  . Flu Shot  08/20/2019  . Mammogram  09/12/2019  . DEXA scan (bone density measurement)  Completed  . COVID-19 Vaccine  Completed  .  Hepatitis C: One time screening is recommended by Center for Disease Control  (CDC) for  adults born from [birth year] through 1963.   Completed  . Pneumonia vaccines  Discontinued    Immunizations Immunization History  Administered Date(s) Administered  . Fluad Quad(high Dose 65+) 10/12/2018  . Influenza Split 09/20/2013  . Influenza Whole 10/23/2016, 11/13/2016  . Influenza, High Dose Seasonal PF 12/20/2017  . Influenza-Unspecified 10/20/2011, 09/17/2014, 10/10/2015  . Moderna SARS-COVID-2 Vaccination 02/14/2019, 03/14/2019  . Pneumococcal Conjugate-13 10/29/2016  . Zoster 10/19/2013, 05/16/2015  . Zoster Recombinat (Shingrix) 07/09/2017, 10/28/2017   Keep all routine maintenance appointments.   Follow up 08/18/19 @ 2:30  Advanced directives: End of life planning; Advance aging; Advanced directives discussed.  Copy of current HCPOA/Living Will requested.    Conditions/risks identified: none new  Follow up in one year for your annual wellness visit    Preventive Care 65 Years and Older, Female Preventive care refers to lifestyle choices and visits with your health care provider that can promote health and wellness. What does preventive care include?  A yearly physical exam. This is also  called an annual well check.  Dental exams once or twice a year.  Routine eye exams. Ask your health care provider how often you should have your eyes checked.  Personal lifestyle choices, including:  Daily care of your teeth and gums.  Regular physical activity.  Eating a healthy diet.  Avoiding tobacco and drug use.  Limiting alcohol use.  Practicing safe sex.  Taking low-dose aspirin every day.  Taking vitamin and mineral supplements as recommended by your health care provider. What happens during an annual well check? The services and screenings done by your health care provider during your annual well check will depend on your age, overall health, lifestyle risk factors, and family history of disease. Counseling  Your health care provider may ask you questions about your:  Alcohol use.  Tobacco use.  Drug use.  Emotional well-being.  Home and relationship well-being.  Sexual activity.  Eating habits.  History of falls.  Memory and ability to understand (cognition).  Work and work Statistician.  Reproductive health. Screening  You may have the following tests or measurements:  Height, weight, and BMI.  Blood pressure.  Lipid and cholesterol levels. These may be checked every 5 years, or more frequently if you are over 51 years old.  Skin check.  Lung cancer screening. You may have this screening every year starting at age 105 if you have a 30-pack-year history of smoking and currently smoke or have quit within the past 15 years.  Fecal occult blood test (FOBT) of the stool. You  may have this test every year starting at age 64.  Flexible sigmoidoscopy or colonoscopy. You may have a sigmoidoscopy every 5 years or a colonoscopy every 10 years starting at age 37.  Hepatitis C blood test.  Hepatitis B blood test.  Sexually transmitted disease (STD) testing.  Diabetes screening. This is done by checking your blood sugar (glucose) after you have not  eaten for a while (fasting). You may have this done every 1-3 years.  Bone density scan. This is done to screen for osteoporosis. You may have this done starting at age 65.  Mammogram. This may be done every 1-2 years. Talk to your health care provider about how often you should have regular mammograms. Talk with your health care provider about your test results, treatment options, and if necessary, the need for more tests. Vaccines  Your health care provider may recommend certain vaccines, such as:  Influenza vaccine. This is recommended every year.  Tetanus, diphtheria, and acellular pertussis (Tdap, Td) vaccine. You may need a Td booster every 10 years.  Zoster vaccine. You may need this after age 54.  Pneumococcal 13-valent conjugate (PCV13) vaccine. One dose is recommended after age 56.  Pneumococcal polysaccharide (PPSV23) vaccine. One dose is recommended after age 70. Talk to your health care provider about which screenings and vaccines you need and how often you need them. This information is not intended to replace advice given to you by your health care provider. Make sure you discuss any questions you have with your health care provider. Document Released: 02/01/2015 Document Revised: 09/25/2015 Document Reviewed: 11/06/2014 Elsevier Interactive Patient Education  2017 [place] Prevention in the Home Falls can cause injuries. They can happen to people of all ages. There are many things you can do to make your home safe and to help prevent falls. What can I do on the outside of my home?  Regularly fix the edges of walkways and driveways and fix any cracks.  Remove anything that might make you trip as you walk through a door, such as a raised step or threshold.  Trim any bushes or trees on the path to your home.  Use bright outdoor lighting.  Clear any walking paths of anything that might make someone trip, such as rocks or tools.  Regularly check to see if  handrails are loose or broken. Make sure that both sides of any steps have handrails.  Any raised decks and porches should have guardrails on the edges.  Have any leaves, snow, or ice cleared regularly.  Use sand or salt on walking paths during winter.  Clean up any spills in your garage right away. This includes oil or grease spills. What can I do in the bathroom?  Use night lights.  Install grab bars by the toilet and in the tub and shower. Do not use towel bars as grab bars.  Use non-skid mats or decals in the tub or shower.  If you need to sit down in the shower, use a plastic, non-slip stool.  Keep the floor dry. Clean up any water that spills on the floor as soon as it happens.  Remove soap buildup in the tub or shower regularly.  Attach bath mats securely with double-sided non-slip rug tape.  Do not have throw rugs and other things on the floor that can make you trip. What can I do in the bedroom?  Use night lights.  Make sure that you have a light by your bed that is  easy to reach.  Do not use any sheets or blankets that are too big for your bed. They should not hang down onto the floor.  Have a firm chair that has side arms. You can use this for support while you get dressed.  Do not have throw rugs and other things on the floor that can make you trip. What can I do in the kitchen?  Clean up any spills right away.  Avoid walking on wet floors.  Keep items that you use a lot in easy-to-reach places.  If you need to reach something above you, use a strong step stool that has a grab bar.  Keep electrical cords out of the way.  Do not use floor polish or wax that makes floors slippery. If you must use wax, use non-skid floor wax.  Do not have throw rugs and other things on the floor that can make you trip. What can I do with my stairs?  Do not leave any items on the stairs.  Make sure that there are handrails on both sides of the stairs and use them. Fix  handrails that are broken or loose. Make sure that handrails are as long as the stairways.  Check any carpeting to make sure that it is firmly attached to the stairs. Fix any carpet that is loose or worn.  Avoid having throw rugs at the top or bottom of the stairs. If you do have throw rugs, attach them to the floor with carpet tape.  Make sure that you have a light switch at the top of the stairs and the bottom of the stairs. If you do not have them, ask someone to add them for you. What else can I do to help prevent falls?  Wear shoes that:  Do not have high heels.  Have rubber bottoms.  Are comfortable and fit you well.  Are closed at the toe. Do not wear sandals.  If you use a stepladder:  Make sure that it is fully opened. Do not climb a closed stepladder.  Make sure that both sides of the stepladder are locked into place.  Ask someone to hold it for you, if possible.  Clearly mark and make sure that you can see:  Any grab bars or handrails.  First and last steps.  Where the edge of each step is.  Use tools that help you move around (mobility aids) if they are needed. These include:  Canes.  Walkers.  Scooters.  Crutches.  Turn on the lights when you go into a dark area. Replace any light bulbs as soon as they burn out.  Set up your furniture so you have a clear path. Avoid moving your furniture around.  If any of your floors are uneven, fix them.  If there are any pets around you, be aware of where they are.  Review your medicines with your doctor. Some medicines can make you feel dizzy. This can increase your chance of falling. Ask your doctor what other things that you can do to help prevent falls. This information is not intended to replace advice given to you by your health care provider. Make sure you discuss any questions you have with your health care provider. Document Released: 11/01/2008 Document Revised: 06/13/2015 Document Reviewed:  02/09/2014 Elsevier Interactive Patient Education  2017 [employer].

## 2019-08-16 NOTE — Progress Notes (Addendum)
Subjective:   Jennifer [name] is a 74 y.o. female who presents for Medicare Annual (Subsequent) preventive examination.  Review of Systems    No ROS.  Medicare Wellness Virtual Visit.    Cardiac Risk Factors include: advanced age (>97men, >10 women);hypertension     Objective:    Today's Vitals   08/16/19 1138  Weight: 147 lb (66.7 kg)  Height: 5\' 1"  (1.549 m)   Body mass index is 27.78 kg/m.  Advanced Directives 08/16/2019 08/15/2018 04/25/2018 04/19/2017 04/13/2017 06/23/2016 06/04/2015  Does Patient Have a Medical Advance Directive? Yes Yes No Yes Yes Yes Yes  Type of Paramedic of [place];Living will [place];Living will - Living will Living will Living will;Healthcare Power of [place];Living will  Does patient want to make changes to medical advance directive? No - Patient declined No - Patient declined - No - Patient declined No - Patient declined No - Patient declined No - Patient declined  Copy of [place] in Chart? No - copy requested No - copy requested - - - No - copy requested No - copy requested    Current Medications (verified) Outpatient Encounter Medications as of 08/16/2019  Medication Sig  . citalopram (CELEXA) 40 MG tablet Take 1 tablet (40 mg total) by mouth daily.  . clonazePAM (KLONOPIN) 0.5 MG tablet TAKE ONE-HALF TO ONE TABLET DAILY AS NEEDED.  . fluticasone (FLONASE) 50 MCG/ACT nasal spray Place 2 sprays into the nose daily as needed for rhinitis.   . hydrochlorothiazide (HYDRODIURIL) 25 MG tablet Take 1 tablet (25 mg total) by mouth daily.  Marland Kitchen ibuprofen (ADVIL,MOTRIN) 200 MG tablet Take 200 mg by mouth 2 (two) times daily as needed for moderate pain.  Marland Kitchen ipratropium (ATROVENT) 0.02 % nebulizer solution Take 2.5 mLs (0.5 mg total) by nebulization every 6 (six) hours as needed for shortness of breath.  . levalbuterol (XOPENEX HFA) 45 MCG/ACT inhaler Inhale 1-2 puffs into the  lungs every 6 (six) hours as needed for wheezing or shortness of breath.  . Multiple Vitamin (MULTIVITAMIN WITH MINERALS) TABS tablet Take 1 tablet by mouth daily.  . mupirocin ointment (BACTROBAN) 2 % Apply to affected area bid (Patient taking differently: Apply 1 application topically daily as needed (irritation). )  . pravastatin (PRAVACHOL) 20 MG tablet Take 1 tablet (20 mg total) by mouth daily.  . Probiotic Product (PROBIOTIC DAILY PO) Take 1 capsule by mouth daily.   Marland Kitchen SPIRIVA HANDIHALER 18 MCG inhalation capsule Place 1 capsule (18 mcg total) into inhaler and inhale daily.   No facility-administered encounter medications on file as of 08/16/2019.    Allergies (verified) Advair diskus [fluticasone-salmeterol] and Codeine   History: Past Medical History:  Diagnosis Date  . Anxiety    panic attacks  . Arthritis   . Asthma   . Bronchitis   . Depression   . 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 Gastritis   . GERD (gastroesophageal reflux disease)   . Hypertension   . PONV (postoperative nausea and vomiting)   . Scoliosis   . Ulcer    Past Surgical History:  Procedure Laterality Date  . ABDOMINAL HYSTERECTOMY    . ANTERIOR CERVICAL DECOMP/DISCECTOMY FUSION N/A 04/19/2017   Procedure: Anterior Cervical Decompression Fusion Cervical Three-Four, Cervical Four-Five, Cervical Five-Six;  Surgeon: Kary Kos, MD;  Location: [facility];  Service: Neurosurgery;  Laterality: N/A;  Anterior Cervical Decompression Fusion Cervical  Three-Four, Cervical Four-Five, Cervical Five-Six  . CHOLECYSTECTOMY  1996  . DILATION AND CURETTAGE OF UTERUS  1971  . LAPAROSCOPIC REMOVAL OF MESENTERIC MASS    . [place]  . PARTIAL HYSTERECTOMY  1981   prolapsed uterus  . TUBAL LIGATION  1978   Family History  Problem Relation Age of Onset  . Stroke Mother   . Cancer Mother        Brain   . CVA Maternal Grandmother   . Cancer Father   . Breast cancer Neg Hx     . Colon cancer Neg Hx    Social History   Socioeconomic History  . Marital status: Married    Spouse name: Not on file  . Number of children: 2  . Years of education: Not on file  . Highest education level: Not on file  Occupational History  . Occupation: retired Pharmacist, hospital  Tobacco Use  . Smoking status: Never Smoker  . Smokeless tobacco: Never Used  Vaping Use  . Vaping Use: Never used  Substance and Sexual Activity  . Alcohol use: No    Alcohol/week: 0.0 standard drinks  . Drug use: No  . Sexual activity: Not on file  Other Topics Concern  . Not on file  Social History Narrative  . Not on file   Social Determinants of Health   Financial Resource Strain: Low Risk   . Difficulty of Paying Living Expenses: Not hard at all  Food Insecurity: No Food Insecurity  . Worried About Charity fundraiser in the Last Year: Never true  . Ran Out of Food in the Last Year: Never true  Transportation Needs: No Transportation Needs  . Lack of Transportation (Medical): No  . Lack of Transportation (Non-Medical): No  Physical Activity:   . Days of Exercise per Week:   . Minutes of Exercise per Session:   Stress: No Stress Concern Present  . Feeling of Stress : Not at all  Social Connections: Unknown  . Frequency of Communication with Friends and Family: More than three times a week  . Frequency of Social Gatherings with Friends and Family: Not on file  . Attends Religious Services: Not on file  . Active Member of Clubs or Organizations: Not on file  . Attends Archivist Meetings: Not on file  . Marital Status: Married    Tobacco Counseling Counseling given: Not Answered   Clinical Intake:  Pre-visit preparation completed: Yes        Diabetes: No  How often do you need to have someone help you when you read instructions, pamphlets, or other written materials from your doctor or pharmacy?: 1 - Never Interpreter Needed?: No      Activities of Daily  Living In your present state of health, do you have any difficulty performing the following activities: 08/16/2019  Hearing? N  Vision? N  Difficulty concentrating or making decisions? N  Walking or climbing stairs? Y  Comment Chronic knee pain; cortisone injections received  Dressing or bathing? N  Doing errands, shopping? N  Preparing Food and eating ? N  Using the Toilet? N  In the past six months, have you accidently leaked urine? N  Do you have problems with loss of bowel control? N  Managing your Medications? N  Managing your Finances? N  Housekeeping or managing your Housekeeping? N  Some recent data might be hidden    Patient Care Team: [name], MD as PCP -  General (Internal Medicine)  Indicate any recent Medical Services you may have received from other than [facility] providers in the past year (date may be approximate).     Assessment:   This is a routine wellness examination for Jennifer [name].  I connected with Jennifer [name] today by telephone and verified that I am speaking with the correct person using two identifiers. Location patient: home Location provider: work Persons participating in the virtual visit: patient, Marine scientist.    I discussed the limitations, risks, security and privacy concerns of performing an evaluation and management service by telephone and the availability of in person appointments. The patient expressed understanding and verbally consented to this telephonic visit.    Interactive audio and video telecommunications were attempted between this provider and patient, however failed, due to patient having technical difficulties OR patient did not have access to video capability.  We continued and completed visit with audio only.  Some vital signs may be absent or patient reported.   Hearing/Vision screen  Hearing Screening   125Hz  250Hz  500Hz  1000Hz  2000Hz  3000Hz  4000Hz  6000Hz  8000Hz   Right ear:           Left ear:           Comments: Patient is able to hear  conversational tones without difficulty.  No issues reported.  Vision Screening Comments: Followed by [facility] Wears corrective lenses Visual acuity not assessed, virtual visit.  They have seen their ophthalmologist in the last 12 months.   Dietary issues and exercise activities discussed: Current Exercise Habits: The patient does not participate in regular exercise at present  Healthy diet Good water intake  Goals      Patient Stated   .  Increase physical activity (pt-stated)      Walk for exercise as tolerated      Depression Screen PHQ 2/9 Scores 08/16/2019 08/15/2018 02/12/2017 06/23/2016 11/11/2015 10/28/2015 05/25/2014  PHQ - 2 Score 0 0 0 0 0 0 0    Fall Risk Fall Risk  08/16/2019 08/15/2018 02/12/2017 06/23/2016 11/11/2015  Falls in the past year? 0 0 No No No  Injury with Fall? 0 - - - -  Follow up Falls evaluation completed - - - -   Handrails in use when climbing stairs? Yes  Home free of loose throw rugs in walkways, pet beds, electrical cords, etc? Yes  Adequate lighting in your home to reduce risk of falls? Yes   ASSISTIVE DEVICES UTILIZED TO PREVENT FALLS: Life alert? No  Use of a cane, walker or w/c? No  Grab bars in the bathroom? No  Shower chair or bench in shower? No  Elevated toilet seat or a handicapped toilet? No   TIMED UP AND GO:  Was the test performed? No . Virtual visit.  Cognitive Function: MMSE - Mini Mental State Exam 06/23/2016  Orientation to time 5  Orientation to Place 5  Registration 3  Attention/ Calculation 5  Recall 3  Language- name 2 objects 2  Language- repeat 1  Language- follow 3 step command 3  Language- read & follow direction 1  Write a sentence 1  Copy design 1  Total score 30     6CIT Screen 08/16/2019 08/15/2018  What Year? 0 points 0 points  What month? 0 points 0 points  What time? - 0 points  Count back from 20 - 0 points  Months in reverse 0 points 0 points  Repeat phrase 6 points 0 points  Total Score  - 0  Immunizations Immunization History  Administered Date(s) Administered  . Fluad Quad(high Dose 65+) 10/12/2018  . Influenza Split 09/20/2013  . Influenza Whole 10/23/2016, 11/13/2016  . Influenza, High Dose Seasonal PF 12/20/2017  . Influenza-Unspecified 10/20/2011, 09/17/2014, 10/10/2015  . Moderna SARS-COVID-2 Vaccination 02/14/2019, 03/14/2019  . Pneumococcal Conjugate-13 10/29/2016  . Zoster 10/19/2013, 05/16/2015  . Zoster Recombinat (Shingrix) 07/09/2017, 10/28/2017    Health Maintenance Health Maintenance  Topic Date Due  . TETANUS/TDAP  Never done  . COLONOSCOPY  02/25/2017  . INFLUENZA VACCINE  08/20/2019  . MAMMOGRAM  09/12/2019  . DEXA SCAN  Completed  . COVID-19 Vaccine  Completed  . Hepatitis C Screening  Completed  . PNA vac Low Risk Adult  Discontinued   Colonoscopy- plans to schedule with [facility]. No referral needed from this office at this time.   Dental Screening: Recommended annual dental exams for proper oral hygiene  Community Resource Referral / Chronic Care Management: CRR required this visit?  No   CCM required this visit?  No      Plan:   Keep all routine maintenance appointments.   Follow up 08/18/19 @ 2:30  I have personally reviewed and noted the following in the patient's chart:   . Medical and social history . Use of alcohol, tobacco or illicit drugs  . Current medications and supplements . Functional ability and status . Nutritional status . Physical activity . Advanced directives . List of other physicians . Hospitalizations, surgeries, and ER visits in previous 12 months . Vitals . Screenings to include cognitive, depression, and falls . Referrals and appointments  In addition, I have reviewed and discussed with patient certain preventive protocols, quality metrics, and best practice recommendations. A written personalized care plan for preventive services as well as general preventive health recommendations were provided  to patient via mychart.     Varney Biles, LPN   1/77/1165    Reviewed above information.  Agree with assessment and plan.   Dr Nicki Reaper

## 2019-08-18 ENCOUNTER — Ambulatory Visit (INDEPENDENT_AMBULATORY_CARE_PROVIDER_SITE_OTHER): Payer: Medicare Other | Admitting: Internal Medicine

## 2019-08-18 ENCOUNTER — Other Ambulatory Visit: Payer: Self-pay

## 2019-08-18 DIAGNOSIS — R739 Hyperglycemia, unspecified: Secondary | ICD-10-CM | POA: Diagnosis not present

## 2019-08-18 DIAGNOSIS — E7800 Pure hypercholesterolemia, unspecified: Secondary | ICD-10-CM

## 2019-08-18 DIAGNOSIS — J984 Other disorders of lung: Secondary | ICD-10-CM

## 2019-08-18 DIAGNOSIS — M25569 Pain in unspecified knee: Secondary | ICD-10-CM

## 2019-08-18 DIAGNOSIS — E538 Deficiency of other specified B group vitamins: Secondary | ICD-10-CM

## 2019-08-18 DIAGNOSIS — I10 Essential (primary) hypertension: Secondary | ICD-10-CM | POA: Diagnosis not present

## 2019-08-18 DIAGNOSIS — M542 Cervicalgia: Secondary | ICD-10-CM

## 2019-08-18 DIAGNOSIS — J4520 Mild intermittent asthma, uncomplicated: Secondary | ICD-10-CM

## 2019-08-18 NOTE — Progress Notes (Signed)
[name], female   DOB: 04/13/1945, 74 y.o.   MRN: [id number]   Subjective:    [name], female    DOB: [birth year], 74 y.o.   MRN: [id number]  HPI This visit occurred during the SARS-CoV-2 public health emergency.  Safety protocols were in place, including screening questions prior to the visit, additional usage of staff PPE, and extensive cleaning of exam room while observing appropriate contact time as indicated for disinfecting solutions.  Patient here for a scheduled follow up.  She reports she is doing relatively well.  Has seen ortho for her knees.  S/p injection (bilateral).  Also seeing [facility] (Dr Saintclair Hal[facility]) for her back/neck.  Using heating pad. Planning for first neck injection Monday.  No chest pain.  Breathing has been stable.  No increased cough or congestion.  No abdominal pain.  Bowels moving.    Past Medical History:  Diagnosis Date  . Anxiety    panic attacks  . Arthritis   . Asthma   . Bronchitis   . Depression   . 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 Gastritis   . GERD (gastroesophageal reflux disease)   . Hypertension   . PONV (postoperative nausea and vomiting)   . Scoliosis   . Ulcer    Past Surgical History:  Procedure Laterality Date  . ABDOMINAL HYSTERECTOMY    . ANTERIOR CERVICAL DECOMP/DISCECTOMY FUSION N/A 04/19/2017   Procedure: Anterior Cervical Decompression Fusion Cervical Three-Four, Cervical Four-Five, Cervical Five-Six;  Surgeon: Kary Kos, MD;  Location: [place];  Service: Neurosurgery;  Laterality: N/A;  Anterior Cervical Decompression Fusion Cervical Three-Four, Cervical Four-Five, Cervical Five-Six  . CHOLECYSTECTOMY  1996  . DILATION AND CURETTAGE OF UTERUS  1971  . LAPAROSCOPIC REMOVAL OF MESENTERIC MASS    . [address]  . PARTIAL HYSTERECTOMY  1981   prolapsed uterus  . TUBAL LIGATION  1978   Family History  Problem Relation Age of Onset  . Stroke Mother   . Cancer Mother         Brain   . CVA Maternal Grandmother   . Cancer Father   . Breast cancer Neg Hx   . Colon cancer Neg Hx    Social History   Socioeconomic History  . Marital status: Married    Spouse name: Not on file  . Number of children: 2  . Years of education: Not on file  . Highest education level: Not on file  Occupational History  . Occupation: retired Pharmacist, hospital  Tobacco Use  . Smoking status: Never Smoker  . Smokeless tobacco: Never Used  Vaping Use  . Vaping Use: Never used  Substance and Sexual Activity  . Alcohol use: No    Alcohol/week: 0.0 standard drinks  . Drug use: No  . Sexual activity: Not on file  Other Topics Concern  . Not on file  Social History Narrative  . Not on file   Social Determinants of Health   Financial Resource Strain: Low Risk   . Difficulty of Paying Living Expenses: Not hard at all  Food Insecurity: No Food Insecurity  . Worried About Charity fundraiser in the Last Year: Never true  . Ran Out of Food in the Last Year: Never true  Transportation Needs: No Transportation Needs  . Lack of Transportation (Medical): No  . Lack of Transportation (Non-Medical): No  Physical Activity:   .  Days of Exercise per Week:   . Minutes of Exercise per Session:   Stress: No Stress Concern Present  . Feeling of Stress : Not at all  Social Connections: Unknown  . Frequency of Communication with Friends and Family: More than three times a week  . Frequency of Social Gatherings with Friends and Family: Not on file  . Attends Religious Services: Not on file  . Active Member of Clubs or Organizations: Not on file  . Attends Archivist Meetings: Not on file  . Marital Status: Married    Outpatient Encounter Medications as of 08/18/2019  Medication Sig  . citalopram (CELEXA) 40 MG tablet Take 1 tablet (40 mg total) by mouth daily.  . clonazePAM (KLONOPIN) 0.5 MG tablet TAKE ONE-HALF TO ONE TABLET DAILY AS NEEDED.  . fluticasone (FLONASE) 50 MCG/ACT  nasal spray Place 2 sprays into the nose daily as needed for rhinitis.   . hydrochlorothiazide (HYDRODIURIL) 25 MG tablet Take 1 tablet (25 mg total) by mouth daily.  Marland Kitchen ibuprofen (ADVIL,MOTRIN) 200 MG tablet Take 200 mg by mouth 2 (two) times daily as needed for moderate pain.  Marland Kitchen ipratropium (ATROVENT) 0.02 % nebulizer solution Take 2.5 mLs (0.5 mg total) by nebulization every 6 (six) hours as needed for shortness of breath.  . levalbuterol (XOPENEX HFA) 45 MCG/ACT inhaler Inhale 1-2 puffs into the lungs every 6 (six) hours as needed for wheezing or shortness of breath.  . Multiple Vitamin (MULTIVITAMIN WITH MINERALS) TABS tablet Take 1 tablet by mouth daily.  . mupirocin ointment (BACTROBAN) 2 % Apply to affected area bid (Patient taking differently: Apply 1 application topically daily as needed (irritation). )  . pravastatin (PRAVACHOL) 20 MG tablet Take 1 tablet (20 mg total) by mouth daily.  . Probiotic Product (PROBIOTIC DAILY PO) Take 1 capsule by mouth daily.   Marland Kitchen SPIRIVA HANDIHALER 18 MCG inhalation capsule Place 1 capsule (18 mcg total) into inhaler and inhale daily.   No facility-administered encounter medications on file as of 08/18/2019.    Review of Systems  Constitutional: Negative for appetite change and unexpected weight change.  HENT: Negative for congestion and sinus pressure.   Respiratory: Negative for cough, chest tightness and shortness of breath.   Cardiovascular: Negative for chest pain, palpitations and leg swelling.  Gastrointestinal: Negative for abdominal pain, diarrhea, nausea and vomiting.  Genitourinary: Negative for difficulty urinating and dysuria.  Musculoskeletal: Positive for back pain and neck pain.  Skin: Negative for color change and rash.  Neurological: Negative for dizziness, light-headedness and headaches.  Psychiatric/Behavioral: Negative for agitation and dysphoric mood.       Objective:    Physical Exam Vitals reviewed.  Constitutional:       General: She is not in acute distress.    Appearance: Normal appearance.  HENT:     Head: Normocephalic and atraumatic.     Right Ear: External ear normal.     Left Ear: External ear normal.  Eyes:     General: No scleral icterus.       Right eye: No discharge.        Left eye: No discharge.     Conjunctiva/sclera: Conjunctivae normal.  Neck:     Thyroid: No thyromegaly.  Cardiovascular:     Rate and Rhythm: Normal rate and regular rhythm.  Pulmonary:     Effort: No respiratory distress.     Breath sounds: Normal breath sounds. No wheezing.  Abdominal:     General: Bowel sounds are  normal.     Palpations: Abdomen is soft.     Tenderness: There is no abdominal tenderness.  Musculoskeletal:        General: No swelling or tenderness.     Cervical back: Neck supple. No tenderness.     Comments: Scoliosis.  Increased pain to palpation - right posterior neck.   Lymphadenopathy:     Cervical: No cervical adenopathy.  Skin:    Findings: No erythema or rash.  Neurological:     Mental Status: She is alert.  Psychiatric:        Mood and Affect: Mood normal.        Behavior: Behavior normal.     BP 112/70   Pulse 71   Temp 97.6 F (36.4 C)   Resp 16   Ht 5' 1"  (1.549 m)   Wt 148 lb (67.1 kg)   SpO2 97%   BMI 27.96 kg/m  Wt Readings from Last 3 Encounters:  08/18/19 148 lb (67.1 kg)  08/16/19 147 lb (66.7 kg)  02/03/19 147 lb (66.7 kg)     Lab Results  Component Value Date   WBC 6.9 03/02/2019   HGB 14.1 03/02/2019   HCT 41.9 03/02/2019   PLT 211.0 03/02/2019   GLUCOSE 96 08/14/2019   CHOL 180 08/14/2019   TRIG 136.0 08/14/2019   HDL 60.90 08/14/2019   LDLDIRECT 148.4 11/03/2012   LDLCALC 92 08/14/2019   ALT 12 08/14/2019   AST 18 08/14/2019   NA 140 08/14/2019   K 3.9 08/14/2019   CL 101 08/14/2019   CREATININE 0.64 08/14/2019   BUN 11 08/14/2019   CO2 34 (H) 08/14/2019   TSH 1.28 03/02/2019   HGBA1C 5.5 08/14/2019    CT CERVICAL SPINE WO  CONTRAST  Result Date: 11/29/2018 CLINICAL DATA:  Prior ACDF 04/19/2017.  Neck pain. EXAM: CT CERVICAL SPINE WITHOUT CONTRAST TECHNIQUE: Multidetector CT imaging of the cervical spine was performed without intravenous contrast. Multiplanar CT image reconstructions were also generated. COMPARISON:  MR cervical spine 11/22/2016 FINDINGS: Alignment: Normal. Skull base and vertebrae: No acute fracture. No primary bone lesion or focal pathologic process. Soft tissues and spinal canal: No prevertebral fluid or swelling. No visible canal hematoma. Disc levels: Anterior cervical fusion from C3 through C6 with plate and screw fixation and interbody graft in place with solid osseous fusion. No hardware failure or complication. At C2-3 there is right facet arthropathy and mild right foraminal stenosis. At C3-4 there is interbody fusion. Mild bilateral facet arthropathy. Mild right foraminal stenosis. At C4-5 there is interbody fusion. No left foraminal stenosis. Severe right foraminal stenosis. Mild bilateral facet arthropathy. At C5-6 there is interbody fusion. Moderate right facet arthropathy. Moderate right foraminal stenosis. At C6-7 there is a mild broad-based disc bulge. Osseous fusion of the right facet joint. Mild right foraminal stenosis. At C7-T1 there is severe right facet arthropathy and severe right foraminal stenosis. There is no left foraminal stenosis. Upper chest: Lung apices are clear. Other: No fluid collection or hematoma. Bilateral carotid artery atherosclerosis. IMPRESSION: 1. Anterior cervical fusion from C3 through C6 as detailed above. No hardware failure or complication. 2. Cervical spine spondylosis as described above. 3.  No acute osseous injury of the cervical spine. Electronically Signed   By: Kathreen Devoid   On: 11/29/2018 11:26       Assessment & Plan:   Problem List Items Addressed This Visit    Asthma    Continue spiriva.  Breathing stable.  B12 deficiency    Recheck B12 to  confirm wnl.       Relevant Orders   Vitamin B12   Chronic restrictive lung disease    Breathing stable.        Essential hypertension, benign    Blood pressure doing well.  On hctz.  Follow met b and pressures.        Relevant Orders   Basic metabolic panel   Hypercholesterolemia    On pravastatin.  Low cholesterol diet and exercise.  Follow lipid panel and liver function tests.          Relevant Orders   Hepatic function panel   Lipid panel   Hyperglycemia    Low carb diet and exercise.  Follow met b anda 1c.   Lab Results  Component Value Date   HGBA1C 5.5 08/14/2019        Relevant Orders   Hemoglobin A1c   Knee pain    Seeing ortho.  S/p injection.       Neck pain    Continue heating pad.  Seeing [facility]. Planning for injection next week.            [name], MD

## 2019-08-19 ENCOUNTER — Encounter: Payer: Self-pay | Admitting: Internal Medicine

## 2019-08-19 DIAGNOSIS — M542 Cervicalgia: Secondary | ICD-10-CM | POA: Insufficient documentation

## 2019-08-19 DIAGNOSIS — E538 Deficiency of other specified B group vitamins: Secondary | ICD-10-CM | POA: Insufficient documentation

## 2019-08-19 NOTE — Assessment & Plan Note (Signed)
Recheck B12 to confirm wnl.  ?

## 2019-08-19 NOTE — Assessment & Plan Note (Signed)
Low carb diet and exercise.  Follow met b anda 1c.   Lab Results  Component Value Date   HGBA1C 5.5 08/14/2019

## 2019-08-19 NOTE — Assessment & Plan Note (Signed)
Blood pressure doing well.  On hctz.  Follow met b and pressures.

## 2019-08-19 NOTE — Assessment & Plan Note (Signed)
Seeing ortho.  S/p injection.  ?

## 2019-08-19 NOTE — Assessment & Plan Note (Signed)
On pravastatin.  Low cholesterol diet and exercise.  Follow lipid panel and liver function tests.   

## 2019-08-19 NOTE — Assessment & Plan Note (Signed)
Breathing stable.

## 2019-08-19 NOTE — Assessment & Plan Note (Signed)
Continue heating pad.  Seeing [facility]. Planning for injection next week.

## 2019-08-19 NOTE — Assessment & Plan Note (Signed)
Continue spiriva. Breathing stable. 

## 2019-08-21 DIAGNOSIS — M47812 Spondylosis without myelopathy or radiculopathy, cervical region: Secondary | ICD-10-CM | POA: Diagnosis not present

## 2019-08-23 ENCOUNTER — Other Ambulatory Visit: Payer: Self-pay | Admitting: Internal Medicine

## 2019-09-04 DIAGNOSIS — Z859 Personal history of malignant neoplasm, unspecified: Secondary | ICD-10-CM | POA: Diagnosis not present

## 2019-09-04 DIAGNOSIS — L570 Actinic keratosis: Secondary | ICD-10-CM | POA: Diagnosis not present

## 2019-09-04 DIAGNOSIS — L578 Other skin changes due to chronic exposure to nonionizing radiation: Secondary | ICD-10-CM | POA: Diagnosis not present

## 2019-09-04 DIAGNOSIS — Z86018 Personal history of other benign neoplasm: Secondary | ICD-10-CM | POA: Diagnosis not present

## 2019-09-04 DIAGNOSIS — Z872 Personal history of diseases of the skin and subcutaneous tissue: Secondary | ICD-10-CM | POA: Diagnosis not present

## 2019-09-09 ENCOUNTER — Other Ambulatory Visit: Payer: Self-pay | Admitting: Internal Medicine

## 2019-09-14 DIAGNOSIS — J449 Chronic obstructive pulmonary disease, unspecified: Secondary | ICD-10-CM | POA: Diagnosis not present

## 2019-09-20 ENCOUNTER — Ambulatory Visit
Admission: RE | Admit: 2019-09-20 | Discharge: 2019-09-20 | Disposition: A | Discharge status: Home / Self Care | Payer: Medicare Other | Source: Ambulatory Visit | Attending: Internal Medicine | Admitting: Internal Medicine

## 2019-09-20 ENCOUNTER — Other Ambulatory Visit: Payer: Self-pay

## 2019-09-20 DIAGNOSIS — Z1231 Encounter for screening mammogram for malignant neoplasm of breast: Secondary | ICD-10-CM | POA: Diagnosis not present

## 2019-09-21 DIAGNOSIS — M47812 Spondylosis without myelopathy or radiculopathy, cervical region: Secondary | ICD-10-CM | POA: Diagnosis not present

## 2019-10-11 ENCOUNTER — Other Ambulatory Visit: Payer: Self-pay | Admitting: Internal Medicine

## 2019-10-12 DIAGNOSIS — M47812 Spondylosis without myelopathy or radiculopathy, cervical region: Secondary | ICD-10-CM | POA: Diagnosis not present

## 2019-10-15 DIAGNOSIS — J449 Chronic obstructive pulmonary disease, unspecified: Secondary | ICD-10-CM | POA: Diagnosis not present

## 2019-11-08 DIAGNOSIS — M4120 Other idiopathic scoliosis, site unspecified: Secondary | ICD-10-CM | POA: Diagnosis not present

## 2019-11-08 DIAGNOSIS — M47812 Spondylosis without myelopathy or radiculopathy, cervical region: Secondary | ICD-10-CM | POA: Diagnosis not present

## 2019-11-08 DIAGNOSIS — M542 Cervicalgia: Secondary | ICD-10-CM | POA: Diagnosis not present

## 2019-11-10 ENCOUNTER — Other Ambulatory Visit: Payer: Self-pay | Admitting: Internal Medicine

## 2019-11-13 ENCOUNTER — Telehealth: Payer: Self-pay

## 2019-11-13 IMAGING — CT CT CERVICAL SPINE W/O CM
4 of 5 series · 14 of 33 positions shown, 16 images · non-contrast
Comparison: MR cervical spine 11/22/2016

CLINICAL DATA: Prior ACDF 04/19/2017.  Neck pain.

EXAM:
CT CERVICAL SPINE WITHOUT CONTRAST
TECHNIQUE: Multidetector CT imaging of the cervical spine was performed without
intravenous contrast. Multiplanar CT image reconstructions were also
generated.

[Series 4: c-spine 2.00 br40 s3 axial (person_name) · axial · 0.33mm/px · z∈[-705,-621]mm · 3 of 86 slices shown, 4 images]
[im 22/86  soft-tissue]
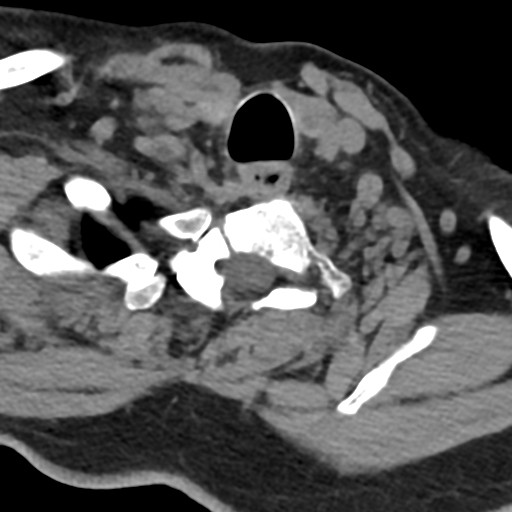
[im 22/86  bone]
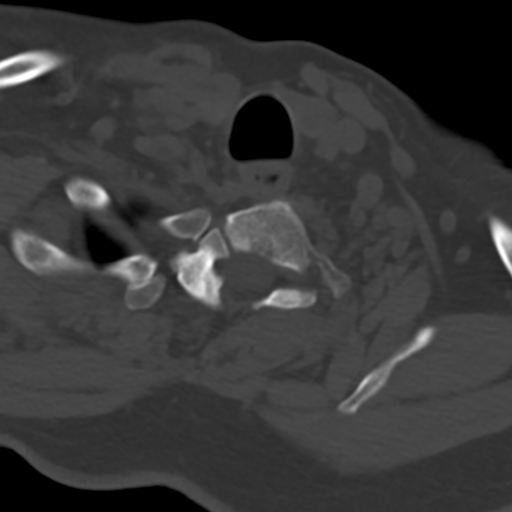
[im 43/86  bone]
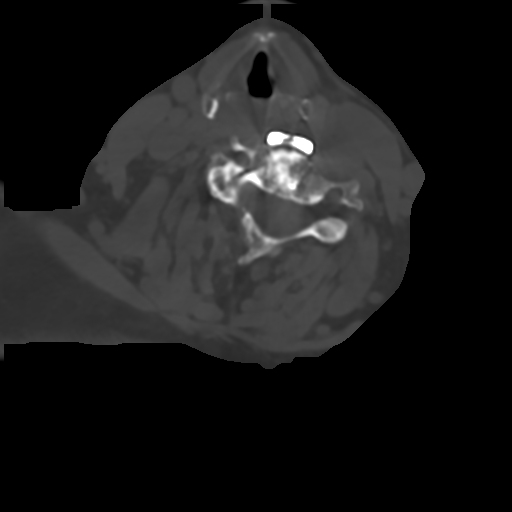
[im 64/86  bone]
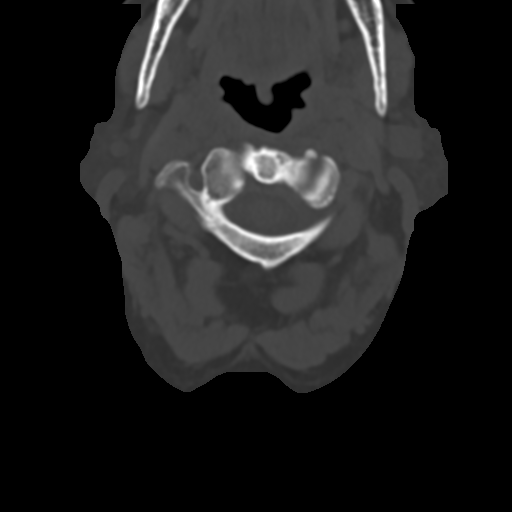

[Series 5: c-spine 2.00 br60 s3 sag bone · sagittal · 0.29mm/px · 5 of 78 slices shown, 6 images]
[im 26/78  bone]
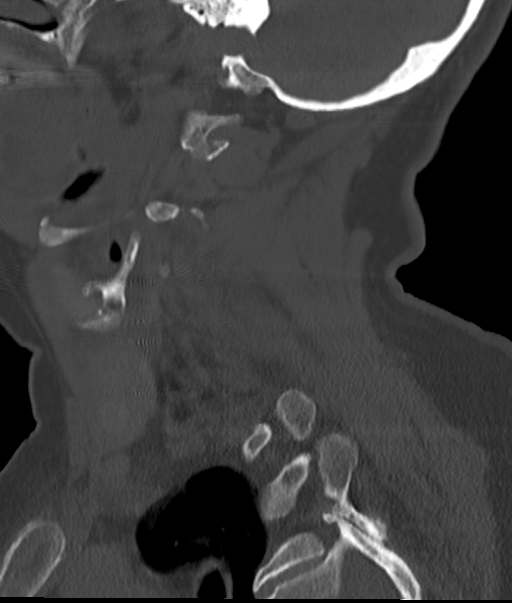
[im 33/78  bone]
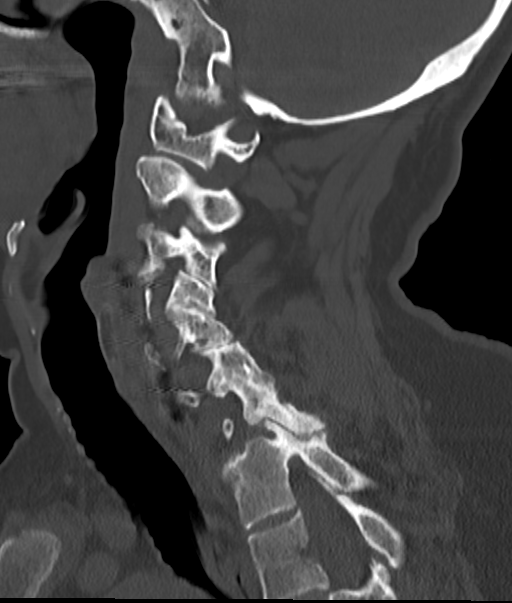
[im 39/78  soft-tissue]
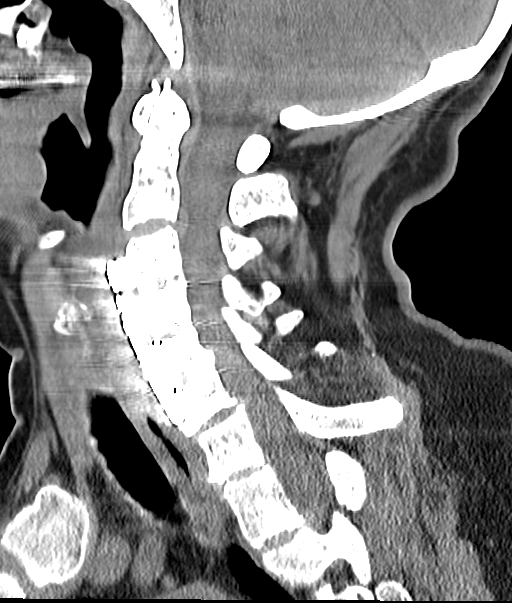
[im 39/78  bone]
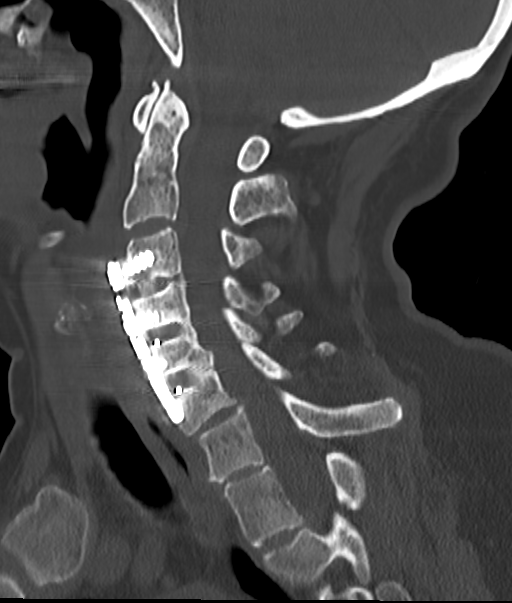
[im 45/78  bone]
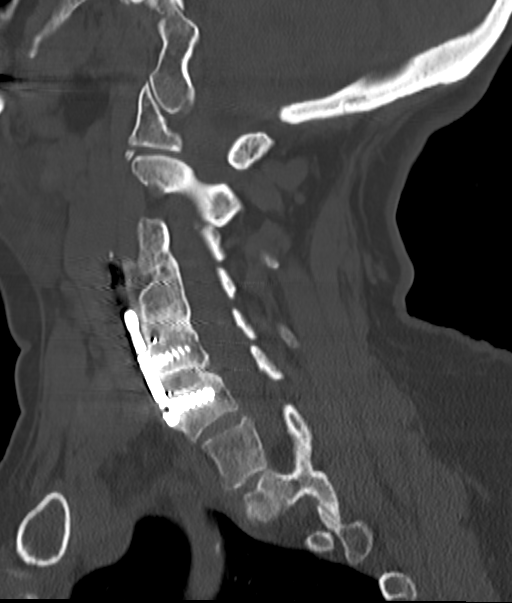
[im 52/78  bone]
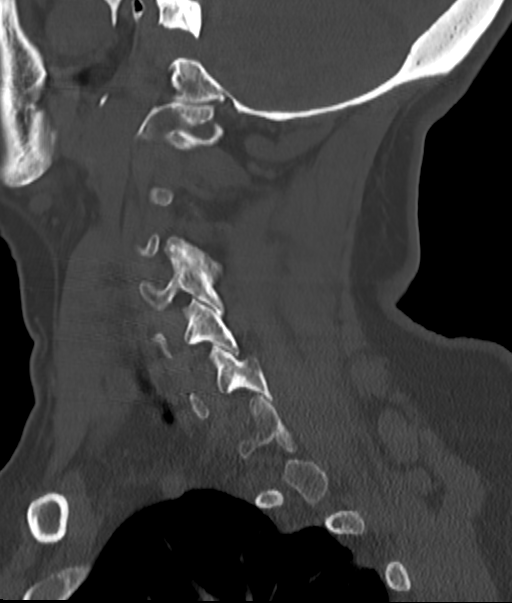

[Series 7: c-spine 2.00 hr60 s3 cor bone · coronal · 0.32mm/px · 3 of 73 slices shown]
[im 15/73  bone]
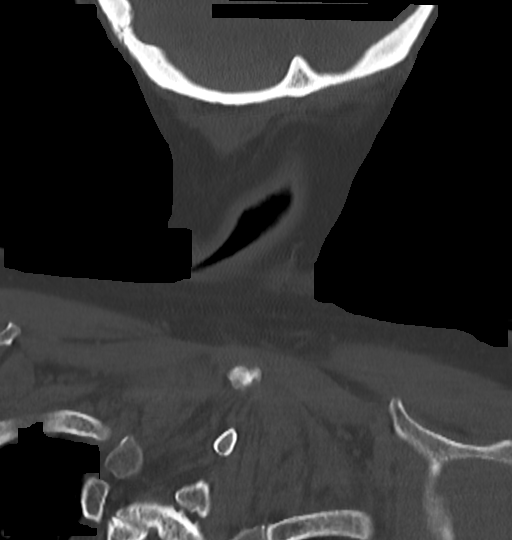
[im 29/73  bone]
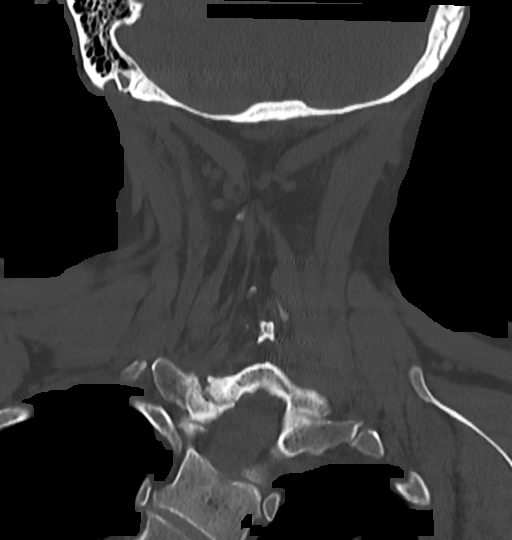
[im 44/73  bone]
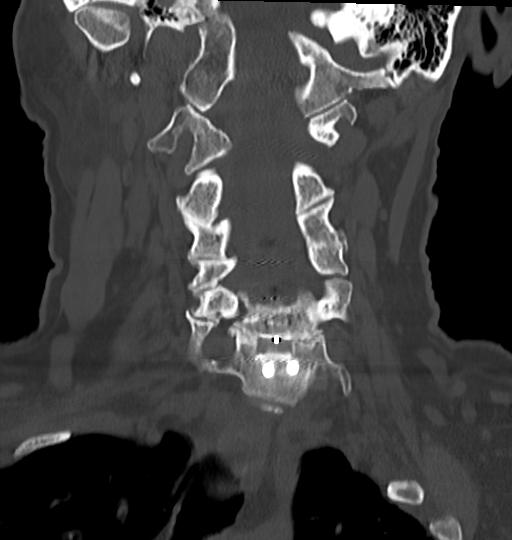

[Series 10: c-spine 2.00 hr60 s3 orthogonal axial · axial · 0.30mm/px · z∈[-688,-636]mm · 3 of 88 slices shown]
[im 22/88  bone]
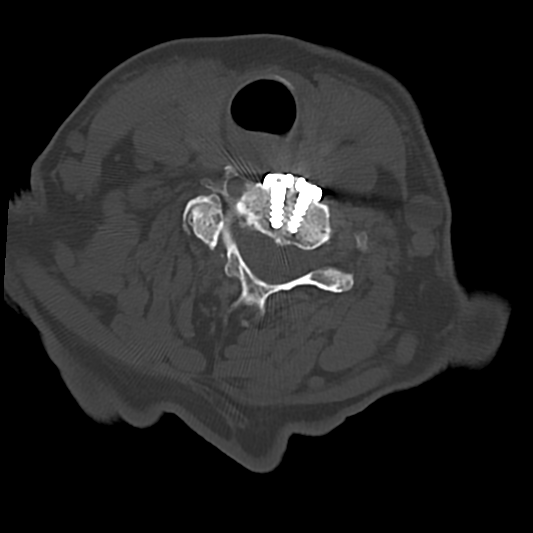
[im 44/88  bone]
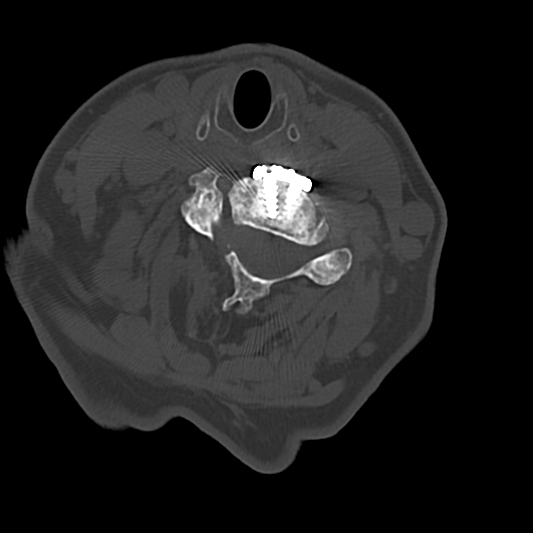
[im 66/88  bone]
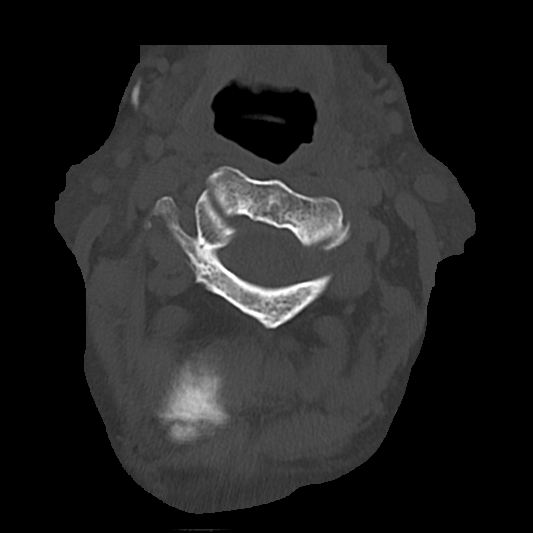

[14 of 33 positions shown; findings below may reference images not displayed]

FINDINGS: Alignment: Normal.

Skull base and vertebrae: No acute fracture. No primary bone lesion
or focal pathologic process.

Soft tissues and spinal canal: No prevertebral fluid or swelling. No
visible canal hematoma.

Disc levels: Anterior cervical fusion from C3 through C6 with plate
and screw fixation and interbody graft in place with solid osseous
fusion. No hardware failure or complication.

At C2-3 there is right facet arthropathy and mild right foraminal
stenosis.

At C3-4 there is interbody fusion. Mild bilateral facet arthropathy.
Mild right foraminal stenosis.

At C4-5 there is interbody fusion. No left foraminal stenosis.
Severe right foraminal stenosis. Mild bilateral facet arthropathy.

At C5-6 there is interbody fusion. Moderate right facet arthropathy.
Moderate right foraminal stenosis.

At C6-7 there is a mild broad-based disc bulge. Osseous fusion of
the right facet joint. Mild right foraminal stenosis.

At C7-T1 there is severe right facet arthropathy and severe right
foraminal stenosis. There is no left foraminal stenosis.

Upper chest: Lung apices are clear.

Other: No fluid collection or hematoma. Bilateral carotid artery
atherosclerosis.
IMPRESSION: 1. Anterior cervical fusion from C3 through C6 as detailed above. No
hardware failure or complication.
2. Cervical spine spondylosis as described above.
3.  No acute osseous injury of the cervical spine.

## 2019-11-13 NOTE — Telephone Encounter (Signed)
I called pharmacy they filed on medicare part B and not part D on her [employer] card so pharmacy filed [place] and they stated it was going through advised patient any problems to call office.

## 2019-11-13 NOTE — Telephone Encounter (Signed)
Pt said when she went to pick up Spiriva and [place] told her she would have to pay $500 for the medication. The pharmacy told her that she would need to contact her doctor and see if we could send her another medication. Pt is out of Spiriva and doesn't know what to do. She would like a call today.

## 2019-11-13 NOTE — Telephone Encounter (Signed)
I have filed for Prior authorization on Spiriva but approval could tak several days no samples in office is thre something else patient can try?

## 2019-11-13 NOTE — Telephone Encounter (Signed)
Spoke to [place].  She spoke to pharmacy.  Was being filed wrong.  Resubmitted.  Per Juliann Pulse, pt notified.

## 2019-11-14 DIAGNOSIS — J449 Chronic obstructive pulmonary disease, unspecified: Secondary | ICD-10-CM | POA: Diagnosis not present

## 2019-11-16 ENCOUNTER — Other Ambulatory Visit: Payer: Self-pay | Admitting: Internal Medicine

## 2019-11-27 ENCOUNTER — Other Ambulatory Visit: Payer: Self-pay | Admitting: Internal Medicine

## 2019-12-01 ENCOUNTER — Other Ambulatory Visit: Payer: Self-pay | Admitting: Internal Medicine

## 2019-12-11 ENCOUNTER — Other Ambulatory Visit: Payer: Self-pay | Admitting: Internal Medicine

## 2019-12-15 DIAGNOSIS — J449 Chronic obstructive pulmonary disease, unspecified: Secondary | ICD-10-CM | POA: Diagnosis not present

## 2019-12-19 ENCOUNTER — Other Ambulatory Visit: Payer: Self-pay

## 2019-12-19 ENCOUNTER — Other Ambulatory Visit (INDEPENDENT_AMBULATORY_CARE_PROVIDER_SITE_OTHER): Payer: Medicare Other

## 2019-12-19 DIAGNOSIS — E7800 Pure hypercholesterolemia, unspecified: Secondary | ICD-10-CM | POA: Diagnosis not present

## 2019-12-19 DIAGNOSIS — I10 Essential (primary) hypertension: Secondary | ICD-10-CM | POA: Diagnosis not present

## 2019-12-19 DIAGNOSIS — R739 Hyperglycemia, unspecified: Secondary | ICD-10-CM

## 2019-12-19 DIAGNOSIS — E538 Deficiency of other specified B group vitamins: Secondary | ICD-10-CM | POA: Diagnosis not present

## 2019-12-19 LAB — BASIC METABOLIC PANEL
BUN: 8 mg/dL (ref 6–23)
CO2: 39 mEq/L — ABNORMAL HIGH (ref 19–32)
Calcium: 9.4 mg/dL (ref 8.4–10.5)
Chloride: 99 mEq/L (ref 96–112)
Creatinine, Ser: 0.6 mg/dL (ref 0.40–1.20)
GFR: 89.37 mL/min (ref >60.00)
Glucose, Bld: 93 mg/dL (ref 70–99)
Potassium: 3.4 mEq/L — ABNORMAL LOW (ref 3.5–5.1)
Sodium: 142 mEq/L (ref 135–145)

## 2019-12-19 LAB — HEMOGLOBIN A1C: Hgb A1c MFr Bld: 5.5 % (ref 4.6–6.5)

## 2019-12-19 LAB — HEPATIC FUNCTION PANEL
ALT: 15 U/L (ref 0–35)
AST: 20 U/L (ref 0–37)
Albumin: 4.2 g/dL (ref 3.5–5.2)
Alkaline Phosphatase: 55 U/L (ref 39–117)
Bilirubin, Direct: 0.1 mg/dL (ref 0.0–0.3)
Total Bilirubin: 0.7 mg/dL (ref 0.2–1.2)
Total Protein: 6.2 g/dL (ref 6.0–8.3)

## 2019-12-19 LAB — LIPID PANEL
Cholesterol: 171 mg/dL (ref 0–200)
HDL: 72.1 mg/dL (ref >39.00)
LDL Cholesterol: 82 mg/dL (ref 0–99)
NonHDL: 99.23
Total CHOL/HDL Ratio: 2
Triglycerides: 85 mg/dL (ref 0.0–149.0)
VLDL: 17 mg/dL (ref 0.0–40.0)

## 2019-12-19 LAB — VITAMIN B12: Vitamin B-12: 207 pg/mL — ABNORMAL LOW (ref 211–911)

## 2019-12-21 ENCOUNTER — Other Ambulatory Visit: Payer: Medicare Other

## 2019-12-21 DIAGNOSIS — M47812 Spondylosis without myelopathy or radiculopathy, cervical region: Secondary | ICD-10-CM | POA: Diagnosis not present

## 2019-12-25 ENCOUNTER — Other Ambulatory Visit: Payer: Self-pay

## 2019-12-25 ENCOUNTER — Encounter: Payer: Self-pay | Admitting: Internal Medicine

## 2019-12-25 ENCOUNTER — Encounter: Payer: Medicare Other | Admitting: Internal Medicine

## 2019-12-25 ENCOUNTER — Ambulatory Visit (INDEPENDENT_AMBULATORY_CARE_PROVIDER_SITE_OTHER): Payer: Medicare Other | Admitting: Internal Medicine

## 2019-12-25 VITALS — BP 112/86 | HR 86 | Temp 98.5°F | Ht 60.98 in | Wt 145.2 lb

## 2019-12-25 DIAGNOSIS — J4520 Mild intermittent asthma, uncomplicated: Secondary | ICD-10-CM | POA: Diagnosis not present

## 2019-12-25 DIAGNOSIS — E538 Deficiency of other specified B group vitamins: Secondary | ICD-10-CM | POA: Diagnosis not present

## 2019-12-25 DIAGNOSIS — M545 Low back pain, unspecified: Secondary | ICD-10-CM

## 2019-12-25 DIAGNOSIS — E7800 Pure hypercholesterolemia, unspecified: Secondary | ICD-10-CM

## 2019-12-25 DIAGNOSIS — I10 Essential (primary) hypertension: Secondary | ICD-10-CM

## 2019-12-25 DIAGNOSIS — Z0000 Encounter for general adult medical examination without abnormal findings: Secondary | ICD-10-CM | POA: Diagnosis not present

## 2019-12-25 DIAGNOSIS — R739 Hyperglycemia, unspecified: Secondary | ICD-10-CM

## 2019-12-25 DIAGNOSIS — K2100 Gastro-esophageal reflux disease with esophagitis, without bleeding: Secondary | ICD-10-CM

## 2019-12-25 DIAGNOSIS — J984 Other disorders of lung: Secondary | ICD-10-CM

## 2019-12-25 MED ORDER — CYANOCOBALAMIN 1000 MCG/ML IJ SOLN
1000.0000 ug | Freq: Once | INTRAMUSCULAR | Status: AC
  Administered 2019-12-25: 1000 ug via INTRAMUSCULAR

## 2019-12-25 NOTE — Assessment & Plan Note (Addendum)
Physical today 12/25/19.  Colonoscopy 2018.  Mammogram 09/20/19 - Birads I.

## 2019-12-25 NOTE — Progress Notes (Signed)
[name], female   DOB: 1945/06/11, 74 y.o.   MRN: [id number]   Subjective:    [name], female    DOB: 1945-05-13, 74 y.o.   MRN: [id number]  HPI This visit occurred during the SARS-CoV-2 public health emergency.  Safety protocols were in place, including screening questions prior to the visit, additional usage of staff PPE, and extensive cleaning of exam room while observing appropriate contact time as indicated for disinfecting solutions.  Patient here for her physical exam.  Seeing ortho for her knees.  Seeing Dr Saintclair Halsted ([facility]) - back.  S/p injection.  Taking 2 ibuprofen per day.  No chest pain or sob.  No abdominal pain.  Bowels moving.  Handling stress.      Past Medical History:  Diagnosis Date  . Anxiety    panic attacks  . Arthritis   . Asthma   . Bronchitis   . Depression   . 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 Gastritis   . GERD (gastroesophageal reflux disease)   . Hypertension   . PONV (postoperative nausea and vomiting)   . Scoliosis   . Ulcer    Past Surgical History:  Procedure Laterality Date  . ABDOMINAL HYSTERECTOMY    . ANTERIOR CERVICAL DECOMP/DISCECTOMY FUSION N/A 04/19/2017   Procedure: Anterior Cervical Decompression Fusion Cervical Three-Four, Cervical Four-Five, Cervical Five-Six;  Surgeon: Kary Kos, MD;  Location: [place];  Service: Neurosurgery;  Laterality: N/A;  Anterior Cervical Decompression Fusion Cervical Three-Four, Cervical Four-Five, Cervical Five-Six  . CHOLECYSTECTOMY  1996  . DILATION AND CURETTAGE OF UTERUS  1971  . LAPAROSCOPIC REMOVAL OF MESENTERIC MASS    . [place]  . PARTIAL HYSTERECTOMY  1981   prolapsed uterus  . TUBAL LIGATION  1978   Family History  Problem Relation Age of Onset  . Stroke Mother   . Cancer Mother        Brain   . CVA Maternal Grandmother   . Cancer Father   . Breast cancer Neg Hx   . Colon cancer Neg Hx    Social History    Socioeconomic History  . Marital status: Married    Spouse name: Not on file  . Number of children: 2  . Years of education: Not on file  . Highest education level: Not on file  Occupational History  . Occupation: retired Pharmacist, hospital  Tobacco Use  . Smoking status: Never Smoker  . Smokeless tobacco: Never Used  Vaping Use  . Vaping Use: Never used  Substance and Sexual Activity  . Alcohol use: No    Alcohol/week: 0.0 standard drinks  . Drug use: No  . Sexual activity: Not on file  Other Topics Concern  . Not on file  Social History Narrative  . Not on file   Social Determinants of Health   Financial Resource Strain: Low Risk   . Difficulty of Paying Living Expenses: Not hard at all  Food Insecurity: No Food Insecurity  . Worried About Charity fundraiser in the Last Year: Never true  . Ran Out of Food in the Last Year: Never true  Transportation Needs: No Transportation Needs  . Lack of Transportation (Medical): No  . Lack of Transportation (Non-Medical): No  Physical Activity: Not on file  Stress: No Stress Concern Present  . Feeling of Stress : Not at all  Social Connections: Unknown  .  Frequency of Communication with Friends and Family: More than three times a week  . Frequency of Social Gatherings with Friends and Family: Not on file  . Attends Religious Services: Not on file  . Active Member of Clubs or Organizations: Not on file  . Attends Archivist Meetings: Not on file  . Marital Status: Married    Outpatient Encounter Medications as of 12/25/2019  Medication Sig  . citalopram (CELEXA) 40 MG tablet Take 1 tablet (40 mg total) by mouth daily.  . clonazePAM (KLONOPIN) 0.5 MG tablet TAKE ONE-HALF TO ONE TABLET AS NEEDED.  . fluticasone (FLONASE) 50 MCG/ACT nasal spray Place 2 sprays into the nose daily as needed for rhinitis.   . hydrochlorothiazide (HYDRODIURIL) 25 MG tablet Take 1 tablet (25 mg total) by mouth daily.  Marland Kitchen ibuprofen (ADVIL,MOTRIN) 200  MG tablet Take 200 mg by mouth 2 (two) times daily as needed for moderate pain.  Marland Kitchen ipratropium (ATROVENT) 0.02 % nebulizer solution Take 2.5 mLs (0.5 mg total) by nebulization every 6 (six) hours as needed for shortness of breath.  . levalbuterol (XOPENEX HFA) 45 MCG/ACT inhaler Inhale 1-2 puffs into the lungs every 6 (six) hours as needed for wheezing or shortness of breath.  . Multiple Vitamin (MULTIVITAMIN WITH MINERALS) TABS tablet Take 1 tablet by mouth daily.  . mupirocin ointment (BACTROBAN) 2 % Apply to affected area bid (Patient taking differently: Apply 1 application topically daily as needed (irritation). )  . pravastatin (PRAVACHOL) 20 MG tablet Take 1 tablet (20 mg total) by mouth daily.  . Probiotic Product (PROBIOTIC DAILY PO) Take 1 capsule by mouth daily.   Marland Kitchen SPIRIVA HANDIHALER 18 MCG inhalation capsule Place 1 capsule (18 mcg total) into inhaler and inhale daily.  . [EXPIRED] cyanocobalamin ((VITAMIN B-12)) injection 1,000 mcg    No facility-administered encounter medications on file as of 12/25/2019.    Review of Systems     Objective:    Physical Exam  BP 112/86 (BP Location: Left Arm, Patient Position: Sitting)   Pulse 86   Temp 98.5 F (36.9 C)   Ht 5' 0.98" (1.549 m)   Wt 145 lb 3.2 oz (65.9 kg)   SpO2 96%   BMI 27.45 kg/m  Wt Readings from Last 3 Encounters:  12/25/19 145 lb 3.2 oz (65.9 kg)  08/18/19 148 lb (67.1 kg)  08/16/19 147 lb (66.7 kg)     Lab Results  Component Value Date   WBC 6.9 03/02/2019   HGB 14.1 03/02/2019   HCT 41.9 03/02/2019   PLT 211.0 03/02/2019   GLUCOSE 93 12/19/2019   CHOL 171 12/19/2019   TRIG 85.0 12/19/2019   HDL 72.10 12/19/2019   LDLDIRECT 148.4 11/03/2012   LDLCALC 82 12/19/2019   ALT 15 12/19/2019   AST 20 12/19/2019   NA 142 12/19/2019   K 3.4 (L) 12/19/2019   CL 99 12/19/2019   CREATININE 0.60 12/19/2019   BUN 8 12/19/2019   CO2 39 (H) 12/19/2019   TSH 1.28 03/02/2019   HGBA1C 5.5 12/19/2019    MM 3D  SCREEN BREAST BILATERAL  Result Date: 09/20/2019 CLINICAL DATA:  Screening. EXAM: DIGITAL SCREENING BILATERAL MAMMOGRAM WITH TOMO AND CAD COMPARISON:  Previous exam(s). ACR Breast Density Category b: There are scattered areas of fibroglandular density. FINDINGS: There are no findings suspicious for malignancy. Images were processed with CAD. IMPRESSION: No mammographic evidence of malignancy. A result letter of this screening mammogram will be mailed directly to the patient. RECOMMENDATION: Screening mammogram  in one year. (Code:SM-B-01Y) BI-RADS CATEGORY  1: Negative. Electronically Signed   By: Lillia Mountain M.D.   On: 09/20/2019 15:48       Assessment & Plan:   Problem List Items Addressed This Visit    Left low back pain    Followed by neurosurgery.  Seeing Dr Saintclair Halsted.       Hyperglycemia    Low carb diet and exercise.  Follow met b and a1c.       Relevant Orders   Hemoglobin A1c   Hypercholesterolemia    On pravastatin.  Low cholesterol diet and exercise.  Follow lipid panel and liver function tests.        Relevant Orders   Hepatic function panel   Lipid panel   Health care maintenance    Physical today 12/25/19.  Colonoscopy 2018.  Mammogram 09/20/19 - Birads I.       GERD (gastroesophageal reflux disease)    History of gastritis and varices.  On PPI.  F/u with GI.  Question of need for f/u EGD.       Essential hypertension, benign    Blood pressure doing well.  On hctz.  Follow met b.  Follow pressures.        Relevant Orders   CBC with Differential/Platelet   Basic metabolic panel   Chronic restrictive lung disease    Breathing stable.        B12 deficiency - Primary   Asthma    Continue spiriva.  Breathing stable.            [name], MD

## 2019-12-31 ENCOUNTER — Encounter: Payer: Self-pay | Admitting: Internal Medicine

## 2019-12-31 NOTE — Assessment & Plan Note (Signed)
History of gastritis and varices.  On PPI.  F/u with GI.  Question of need for f/u EGD.

## 2019-12-31 NOTE — Assessment & Plan Note (Signed)
On pravastatin.  Low cholesterol diet and exercise.  Follow lipid panel and liver function tests.   

## 2019-12-31 NOTE — Assessment & Plan Note (Signed)
Continue spiriva. Breathing stable. 

## 2019-12-31 NOTE — Assessment & Plan Note (Signed)
Blood pressure doing well.  On hctz.  Follow met b.  Follow pressures.

## 2019-12-31 NOTE — Assessment & Plan Note (Signed)
Breathing stable.

## 2019-12-31 NOTE — Assessment & Plan Note (Signed)
Low carb diet and exercise.  Follow met b and a1c.  

## 2019-12-31 NOTE — Assessment & Plan Note (Signed)
Followed by neurosurgery.  Seeing Dr Saintclair Halsted.

## 2020-01-02 ENCOUNTER — Ambulatory Visit (INDEPENDENT_AMBULATORY_CARE_PROVIDER_SITE_OTHER): Payer: Medicare Other

## 2020-01-02 ENCOUNTER — Other Ambulatory Visit: Payer: Self-pay

## 2020-01-02 DIAGNOSIS — E538 Deficiency of other specified B group vitamins: Secondary | ICD-10-CM | POA: Diagnosis not present

## 2020-01-02 MED ORDER — CYANOCOBALAMIN 1000 MCG/ML IJ SOLN
1000.0000 ug | Freq: Once | INTRAMUSCULAR | Status: AC
  Administered 2020-01-02: 15:00:00 1000 ug via INTRAMUSCULAR

## 2020-01-02 NOTE — Progress Notes (Signed)
Patient presented for B 12 injection to left deltoid, patient voiced no concerns nor showed any signs of distress during injection. 

## 2020-01-09 ENCOUNTER — Ambulatory Visit: Payer: Medicare Other

## 2020-01-09 ENCOUNTER — Ambulatory Visit (INDEPENDENT_AMBULATORY_CARE_PROVIDER_SITE_OTHER): Payer: Medicare Other

## 2020-01-09 ENCOUNTER — Other Ambulatory Visit: Payer: Self-pay

## 2020-01-09 DIAGNOSIS — E538 Deficiency of other specified B group vitamins: Secondary | ICD-10-CM | POA: Diagnosis not present

## 2020-01-09 MED ORDER — CYANOCOBALAMIN 1000 MCG/ML IJ SOLN
1000.0000 ug | Freq: Once | INTRAMUSCULAR | Status: AC
  Administered 2020-01-09: 12:00:00 1000 ug via INTRAMUSCULAR

## 2020-01-09 NOTE — Progress Notes (Signed)
Patient presented for B 12 injection to left deltoid, patient voiced no concerns nor showed any signs of distress during injection. 

## 2020-01-14 DIAGNOSIS — J449 Chronic obstructive pulmonary disease, unspecified: Secondary | ICD-10-CM | POA: Diagnosis not present

## 2020-01-15 ENCOUNTER — Other Ambulatory Visit: Payer: Self-pay | Admitting: Internal Medicine

## 2020-01-16 ENCOUNTER — Ambulatory Visit: Payer: Medicare Other

## 2020-01-16 MED ORDER — CLONAZEPAM 0.5 MG PO TABS
ORAL_TABLET | ORAL | 1 refills | Status: DC
Start: 2020-01-16 — End: 2020-05-01

## 2020-01-16 NOTE — Telephone Encounter (Signed)
I sent in rx for clonazepam.  The order that was sent to me stated print.  If a prescription printed, can shred, because I sent in the prescription through epic.

## 2020-01-16 NOTE — Addendum Note (Signed)
Addended by: Charm Barges on: 01/16/2020 08:36 AM   Modules accepted: Orders

## 2020-01-20 HISTORY — PX: BREAST LUMPECTOMY: SHX2

## 2020-01-25 ENCOUNTER — Encounter: Payer: Self-pay | Admitting: Family Medicine

## 2020-01-25 ENCOUNTER — Other Ambulatory Visit (INDEPENDENT_AMBULATORY_CARE_PROVIDER_SITE_OTHER): Payer: BC Managed Care – PPO

## 2020-01-25 ENCOUNTER — Other Ambulatory Visit: Payer: Self-pay

## 2020-01-25 ENCOUNTER — Telehealth (INDEPENDENT_AMBULATORY_CARE_PROVIDER_SITE_OTHER): Payer: BC Managed Care – PPO | Admitting: Family Medicine

## 2020-01-25 VITALS — Ht 61.0 in | Wt 145.0 lb

## 2020-01-25 DIAGNOSIS — J029 Acute pharyngitis, unspecified: Secondary | ICD-10-CM

## 2020-01-25 LAB — POCT INFLUENZA A/B
Influenza A, POC: NEGATIVE
Influenza B, POC: NEGATIVE

## 2020-01-25 LAB — POCT RAPID STREP A (OFFICE): Rapid Strep A Screen: NEGATIVE

## 2020-01-25 NOTE — Progress Notes (Signed)
Virtual Visit via telephone Note  This visit type was conducted due to national recommendations for restrictions regarding the COVID-19 pandemic (e.g. social distancing).  This format is felt to be most appropriate for this patient at this time.  All issues noted in this document were discussed and addressed.  No physical exam was performed (except for noted visual exam findings with Video Visits).   I connected with Jennifer [name] today at  9:30 AM EST by telephone and verified that I am speaking with the correct person using two identifiers. Location patient: home Location provider: work Persons participating in the virtual visit: patient, provider, Yatzil Clippinger (husband)  I discussed the limitations, risks, security and privacy concerns of performing an evaluation and management service by telephone and the availability of in person appointments. I also discussed with the patient that there may be a patient responsible charge related to this service. The patient expressed understanding and agreed to proceed.  Interactive audio and video telecommunications were attempted between this provider and patient, however failed, due to patient having technical difficulties OR patient did not have access to video capability.  We continued and completed visit with audio only.   Reason for visit: same day visit  HPI: Sore throat: This has been going on since 01/22/2020.  She notes it feels congested in her throat.  She is not blowing anything out of her nose though does have mild nasal congestion.  No cough, fevers, shortness of breath, taste or smell disturbances.  She notes no Covid exposures in the last 2 weeks though was exposed a little over 2 weeks ago.  She did take a negative home Covid test yesterday.  She is fully vaccinated.  She has not been taking any medications for this.   ROS: See pertinent positives and negatives per HPI.  Past Medical History:  Diagnosis Date  . Anxiety    panic attacks   . Arthritis   . Asthma   . Bronchitis   . Depression   . 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 Gastritis   . GERD (gastroesophageal reflux disease)   . Hypertension   . PONV (postoperative nausea and vomiting)   . Scoliosis   . Ulcer     Past Surgical History:  Procedure Laterality Date  . ABDOMINAL HYSTERECTOMY    . ANTERIOR CERVICAL DECOMP/DISCECTOMY FUSION N/A 04/19/2017   Procedure: Anterior Cervical Decompression Fusion Cervical Three-Four, Cervical Four-Five, Cervical Five-Six;  Surgeon: Donalee Citrin, MD;  Location: [facility] OR;  Service: Neurosurgery;  Laterality: N/A;  Anterior Cervical Decompression Fusion Cervical Three-Four, Cervical Four-Five, Cervical Five-Six  . CHOLECYSTECTOMY  1996  . DILATION AND CURETTAGE OF UTERUS  1971  . LAPAROSCOPIC REMOVAL OF MESENTERIC MASS    . LUMBAR DISC SURGERY  1998   [facility]  . PARTIAL HYSTERECTOMY  1981   prolapsed uterus  . TUBAL LIGATION  1978    Family History  Problem Relation Age of Onset  . Stroke Mother   . Cancer Mother        Brain   . CVA Maternal Grandmother   . Cancer Father   . Breast cancer Neg Hx   . Colon cancer Neg Hx     SOCIAL HX: Non-smoker   Current Outpatient Medications:  .  citalopram (CELEXA) 40 MG tablet, Take 1 tablet (40 mg total) by mouth daily., Disp: 90 tablet, Rfl: 0 .  clonazePAM (KLONOPIN) 0.5 MG tablet, Take 1/2 - 1  tablet q day prn., Disp: 30 tablet, Rfl: 1 .  fluticasone (FLONASE) 50 MCG/ACT nasal spray, Place 2 sprays into the nose daily as needed for rhinitis. , Disp: , Rfl:  .  hydrochlorothiazide (HYDRODIURIL) 25 MG tablet, Take 1 tablet (25 mg total) by mouth daily., Disp: 90 tablet, Rfl: 0 .  ibuprofen (ADVIL,MOTRIN) 200 MG tablet, Take 200 mg by mouth 2 (two) times daily as needed for moderate pain., Disp: , Rfl:  .  ipratropium (ATROVENT) 0.02 % nebulizer solution, Take 2.5 mLs (0.5 mg total) by nebulization every 6 (six) hours as needed for shortness of breath., Disp:  75 mL, Rfl: 1 .  levalbuterol (XOPENEX HFA) 45 MCG/ACT inhaler, Inhale 1-2 puffs into the lungs every 6 (six) hours as needed for wheezing or shortness of breath., Disp: 1 Inhaler, Rfl: 2 .  Multiple Vitamin (MULTIVITAMIN WITH MINERALS) TABS tablet, Take 1 tablet by mouth daily., Disp: , Rfl:  .  mupirocin ointment (BACTROBAN) 2 %, Apply to affected area bid (Patient taking differently: Apply 1 application topically daily as needed (irritation).), Disp: 22 g, Rfl: 0 .  pravastatin (PRAVACHOL) 20 MG tablet, Take 1 tablet (20 mg total) by mouth daily., Disp: 90 tablet, Rfl: 2 .  Probiotic Product (PROBIOTIC DAILY PO), Take 1 capsule by mouth daily. , Disp: , Rfl:  .  SPIRIVA HANDIHALER 18 MCG inhalation capsule, Place 1 capsule (18 mcg total) into inhaler and inhale daily., Disp: 30 capsule, Rfl: 0  EXAM: This was a telephone visit and thus no physical exam was completed.  ASSESSMENT AND PLAN:  Discussed the following assessment and plan:  Problem List Items Addressed This Visit    Sore throat - Primary    New onset issue.  Symptoms seem to be relatively mild.  We will get rapid strep testing and flu test.  We will also swab for COVID-19.  Discussed strict quarantine precautions to last at least until we get the COVID-19 test result back.  Discussed that at this time antibiotics would not be indicated.  Discussed supportive care and use of Tylenol as needed.  Discussed seeking medical attention if she developed chest pain, shortness of breath, or fevers of 103 F or higher.      Relevant Orders   Novel Coronavirus, NAA ([facility])   POCT rapid strep A   POCT Influenza A/B       I discussed the assessment and treatment plan with the patient. The patient was provided an opportunity to ask questions and all were answered. The patient agreed with the plan and demonstrated an understanding of the instructions.   The patient was advised to call back or seek an in-person evaluation if the symptoms  worsen or if the condition fails to improve as anticipated.  I provided 8 minutes of non-face-to-face time during this encounter.   Tommi Rumps, MD

## 2020-01-25 NOTE — Assessment & Plan Note (Signed)
New onset issue.  Symptoms seem to be relatively mild.  We will get rapid strep testing and flu test.  We will also swab for COVID-19.  Discussed strict quarantine precautions to last at least until we get the COVID-19 test result back.  Discussed that at this time antibiotics would not be indicated.  Discussed supportive care and use of Tylenol as needed.  Discussed seeking medical attention if she developed chest pain, shortness of breath, or fevers of 103 F or higher.

## 2020-01-27 LAB — SARS-COV-2, NAA 2 DAY TAT

## 2020-01-27 LAB — NOVEL CORONAVIRUS, NAA: SARS-CoV-2, NAA: NOT DETECTED

## 2020-01-29 ENCOUNTER — Telehealth: Payer: Self-pay | Admitting: Internal Medicine

## 2020-01-29 NOTE — Telephone Encounter (Signed)
Pt scheduled with Dr Scott 

## 2020-01-29 NOTE — Telephone Encounter (Signed)
Pt called and wanted to speak to Dr. Nicki Reaper she saw Dr. Caryl Bis on 01/25/20 and all her test came back neg but she is wanting something called in to help with the drainage

## 2020-01-30 ENCOUNTER — Telehealth (INDEPENDENT_AMBULATORY_CARE_PROVIDER_SITE_OTHER): Payer: Medicare Other | Admitting: Internal Medicine

## 2020-01-30 DIAGNOSIS — R0981 Nasal congestion: Secondary | ICD-10-CM

## 2020-01-30 DIAGNOSIS — I10 Essential (primary) hypertension: Secondary | ICD-10-CM | POA: Diagnosis not present

## 2020-01-30 MED ORDER — AMOXICILLIN 875 MG PO TABS: 875.0000 mg | ORAL_TABLET | Freq: Two times a day (BID) | ORAL | 0 refills | Status: DC

## 2020-01-30 MED ORDER — FLUTICASONE PROPIONATE 50 MCG/ACT NA SUSP: 2.0000 | Freq: Every day | NASAL | 1 refills | Status: DC | PRN

## 2020-01-30 NOTE — Progress Notes (Signed)
[name], female   DOB: Feb 16, 1945, 75 y.o.   MRN: [id number]   Virtual Visit via telephone Note  This visit type was conducted due to national recommendations for restrictions regarding the COVID-19 pandemic (e.g. social distancing).  This format is felt to be most appropriate for this patient at this time.  All issues noted in this document were discussed and addressed.  No physical exam was performed (except for noted visual exam findings with Video Visits).   I connected with Jennifer [name] by telephone and verified that I am speaking with the correct person using two identifiers. Location patient: home Location provider: work  Persons participating in the telephone visit: patient, provider  The limitations, risks, security and privacy concerns of performing an evaluation and management service by telephone and the availability of in person appointments have been discussed.  It has also been discussed with the patient that there may be a patient responsible charge related to this service. The patient expressed understanding and agreed to proceed.   Reason for visit:  Work in appt  HPI: Work in appt. Symptoms started 01/22/20.  States throat felt weird - "blob" in her throat.  States throat - red/splotchy.  Saw Dr Caryl Bis 01/25/20.  Note reviewed.  covid and flu swab negative.  Describes nasal stuffiness. Head feels clogged.  No headache.  No chest congestion or cough.  No nausea or vomiting.  No diarrhea.  No sob.  Breathing overall stable.  She is gargling with salt water.  Taking her regular medications.  Discussed using flonase. No recent covid exposures.  Son and daughter-n-law - 12.25.21.    ROS: See pertinent positives and negatives per HPI.  Past Medical History:  Diagnosis Date  . Anxiety    panic attacks  . Arthritis   . Asthma   . Bronchitis   . Depression   . 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 Gastritis   . GERD (gastroesophageal reflux  disease)   . Hypertension   . PONV (postoperative nausea and vomiting)   . Scoliosis   . Ulcer     Past Surgical History:  Procedure Laterality Date  . ABDOMINAL HYSTERECTOMY    . ANTERIOR CERVICAL DECOMP/DISCECTOMY FUSION N/A 04/19/2017   Procedure: Anterior Cervical Decompression Fusion Cervical Three-Four, Cervical Four-Five, Cervical Five-Six;  Surgeon: Kary Kos, MD;  Location: [facility];  Service: Neurosurgery;  Laterality: N/A;  Anterior Cervical Decompression Fusion Cervical Three-Four, Cervical Four-Five, Cervical Five-Six  . CHOLECYSTECTOMY  1996  . DILATION AND CURETTAGE OF UTERUS  1971  . LAPAROSCOPIC REMOVAL OF MESENTERIC MASS    . [place]  . PARTIAL HYSTERECTOMY  1981   prolapsed uterus  . TUBAL LIGATION  1978    Family History  Problem Relation Age of Onset  . Stroke Mother   . Cancer Mother        Brain   . CVA Maternal Grandmother   . Cancer Father   . Breast cancer Neg Hx   . Colon cancer Neg Hx     SOCIAL HX: reviewed.    Current Outpatient Medications:  .  amoxicillin (AMOXIL) 875 MG tablet, Take 1 tablet (875 mg total) by mouth 2 (two) times daily., Disp: 20 tablet, Rfl: 0 .  citalopram (CELEXA) 40 MG tablet, Take 1 tablet (40 mg total) by mouth daily., Disp: 90 tablet, Rfl: 0 .  clonazePAM (KLONOPIN) 0.5 MG tablet, Take  1/2 - 1 tablet q day prn., Disp: 30 tablet, Rfl: 1 .  fluticasone (FLONASE) 50 MCG/ACT nasal spray, Place 2 sprays into both nostrils daily as needed for rhinitis., Disp: 18 mL, Rfl: 1 .  hydrochlorothiazide (HYDRODIURIL) 25 MG tablet, Take 1 tablet (25 mg total) by mouth daily., Disp: 90 tablet, Rfl: 0 .  ibuprofen (ADVIL,MOTRIN) 200 MG tablet, Take 200 mg by mouth 2 (two) times daily as needed for moderate pain., Disp: , Rfl:  .  ipratropium (ATROVENT) 0.02 % nebulizer solution, Take 2.5 mLs (0.5 mg total) by nebulization every 6 (six) hours as needed for shortness of breath., Disp: 75 mL, Rfl: 1 .  levalbuterol  (XOPENEX HFA) 45 MCG/ACT inhaler, Inhale 1-2 puffs into the lungs every 6 (six) hours as needed for wheezing or shortness of breath., Disp: 1 Inhaler, Rfl: 2 .  Multiple Vitamin (MULTIVITAMIN WITH MINERALS) TABS tablet, Take 1 tablet by mouth daily., Disp: , Rfl:  .  mupirocin ointment (BACTROBAN) 2 %, Apply to affected area bid (Patient taking differently: Apply 1 application topically daily as needed (irritation).), Disp: 22 g, Rfl: 0 .  pravastatin (PRAVACHOL) 20 MG tablet, Take 1 tablet (20 mg total) by mouth daily., Disp: 90 tablet, Rfl: 2 .  Probiotic Product (PROBIOTIC DAILY PO), Take 1 capsule by mouth daily. , Disp: , Rfl:  .  SPIRIVA HANDIHALER 18 MCG inhalation capsule, Place 1 capsule (18 mcg total) into inhaler and inhale daily., Disp: 30 capsule, Rfl: 0  EXAM:  GENERAL: alert.  Appears to be in no acute distress.  Answering questions appropriately.   PSYCH/NEURO: pleasant and cooperative, no obvious depression or anxiety, speech and thought processing grossly intact  ASSESSMENT AND PLAN:  Discussed the following assessment and plan:  Problem List Items Addressed This Visit    Essential hypertension, benign    Blood pressure has been doing well.  On hctz.  Follow pressures.        Nasal congestion    Persistent nasal congestion, sinus pressure and cough.  covid negative.  Flu negative.  Persistent symptoms.  Discussed saline nasal spray and flonase nasal spray as directed.  Robitussin DM as directed.  rx for amoxicillin sent in.  Will try above.  If persistent symptoms, will get amoxicillin rx filled.  No sob.  No chest pain.  Has been quarantined.  Follow closely. Send update regarding symptoms.           I discussed the assessment and treatment plan with the patient. The patient was provided an opportunity to ask questions and all were answered. The patient agreed with the plan and demonstrated an understanding of the instructions.   The patient was advised to call back  or seek an in-person evaluation if the symptoms worsen or if the condition fails to improve as anticipated.  I provided 23 minutes of non-face-to-face time during this encounter.   [name], MD

## 2020-02-04 ENCOUNTER — Encounter: Payer: Self-pay | Admitting: Internal Medicine

## 2020-02-04 DIAGNOSIS — R0981 Nasal congestion: Secondary | ICD-10-CM | POA: Insufficient documentation

## 2020-02-04 NOTE — Assessment & Plan Note (Signed)
Persistent nasal congestion, sinus pressure and cough.  covid negative.  Flu negative.  Persistent symptoms.  Discussed saline nasal spray and flonase nasal spray as directed.  Robitussin DM as directed.  rx for amoxicillin sent in.  Will try above.  If persistent symptoms, will get amoxicillin rx filled.  No sob.  No chest pain.  Has been quarantined.  Follow closely. Send update regarding symptoms.

## 2020-02-04 NOTE — Assessment & Plan Note (Signed)
Blood pressure has been doing well.  On hctz.  Follow pressures.

## 2020-02-06 DIAGNOSIS — M47812 Spondylosis without myelopathy or radiculopathy, cervical region: Secondary | ICD-10-CM | POA: Diagnosis not present

## 2020-02-13 ENCOUNTER — Other Ambulatory Visit: Payer: Self-pay | Admitting: Internal Medicine

## 2020-02-14 DIAGNOSIS — J449 Chronic obstructive pulmonary disease, unspecified: Secondary | ICD-10-CM | POA: Diagnosis not present

## 2020-02-15 ENCOUNTER — Other Ambulatory Visit: Payer: Self-pay

## 2020-02-16 ENCOUNTER — Ambulatory Visit (INDEPENDENT_AMBULATORY_CARE_PROVIDER_SITE_OTHER): Payer: Medicare Other

## 2020-02-16 DIAGNOSIS — E538 Deficiency of other specified B group vitamins: Secondary | ICD-10-CM

## 2020-02-16 MED ORDER — CYANOCOBALAMIN 1000 MCG/ML IJ SOLN
1000.0000 ug | Freq: Once | INTRAMUSCULAR | Status: AC
  Administered 2020-02-16: 1000 ug via INTRAMUSCULAR

## 2020-02-16 NOTE — Progress Notes (Signed)
Patient presented for B 12 injection to left deltoid, patient voiced no concerns nor showed any signs of distress during injection. 

## 2020-02-20 DIAGNOSIS — M542 Cervicalgia: Secondary | ICD-10-CM | POA: Diagnosis not present

## 2020-02-26 DIAGNOSIS — M542 Cervicalgia: Secondary | ICD-10-CM | POA: Diagnosis not present

## 2020-02-27 DIAGNOSIS — M542 Cervicalgia: Secondary | ICD-10-CM | POA: Diagnosis not present

## 2020-03-05 ENCOUNTER — Other Ambulatory Visit: Payer: Self-pay | Admitting: Internal Medicine

## 2020-03-05 DIAGNOSIS — M542 Cervicalgia: Secondary | ICD-10-CM | POA: Diagnosis not present

## 2020-03-07 DIAGNOSIS — M542 Cervicalgia: Secondary | ICD-10-CM | POA: Diagnosis not present

## 2020-03-12 DIAGNOSIS — M542 Cervicalgia: Secondary | ICD-10-CM | POA: Diagnosis not present

## 2020-03-14 ENCOUNTER — Ambulatory Visit
Admission: EM | Admit: 2020-03-14 | Discharge: 2020-03-14 | Disposition: A | Discharge status: Home / Self Care | Payer: BC Managed Care – PPO

## 2020-03-14 ENCOUNTER — Other Ambulatory Visit: Payer: Self-pay

## 2020-03-14 ENCOUNTER — Encounter: Payer: Self-pay | Admitting: Emergency Medicine

## 2020-03-14 DIAGNOSIS — R42 Dizziness and giddiness: Secondary | ICD-10-CM

## 2020-03-14 MED ORDER — DIAZEPAM 2 MG PO TABS: ORAL_TABLET | ORAL | 0 refills | Status: DC

## 2020-03-14 MED ORDER — MECLIZINE HCL 25 MG PO TABS: 25.0000 mg | ORAL_TABLET | Freq: Three times a day (TID) | ORAL | 0 refills | Status: AC | PRN

## 2020-03-14 NOTE — Discharge Instructions (Addendum)
Try the meclizine for your dizziness and Epley maneuvers at home. If not helping in a couple of days, start the Valium, but do not take klonopin with this. Be careful with this medication as it can make you a little tired. Contact your PT office and ask about vestibular rehab or if they can advise a place you can get vestibular rehab. For any sudden or severe worsening of symptoms, go to ED.

## 2020-03-14 NOTE — ED Provider Notes (Signed)
[facility]    CSN: [id number] Arrival date & time: 03/14/20  1110      History   Chief Complaint Chief Complaint  Patient presents with  . Dizziness    HPI Jennifer [name] is a 75 y.o. female presenting for 2-day history of dizziness and positional vertigo.  Patient states that when she does not move she does not have dizziness but anytime she moves her head, especially to the left she has dizziness.  Denies any nausea or vomiting.  Patient has been going to PT for her scoliosis and chronic neck pain.  She says she has been doing a lot of neck exercises recently and with some of the exercises she has increased dizziness.  She said that the dizziness actually started when she was in PT a couple days ago.  Patient has had similar problems in the past with positional vertigo and states that meclizine typically works for her.  She has tried the over-the-counter meclizine, but states that prescription works better.  She denies any symptoms not characteristic of her positional vertigo.  Patient says that she gets flareups about once a year and this has been ongoing for about 17 years.  She denies any headaches, vision changes, balance or speech problems, chest pain, palpitations, shortness of breath, weakness, falls, syncope or passing out.  Patient has no other complaints or concerns today.  HPI  Past Medical History:  Diagnosis Date  . Anxiety    panic attacks  . Arthritis   . Asthma   . Bronchitis   . Depression   . 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 Gastritis   . GERD (gastroesophageal reflux disease)   . Hypertension   . PONV (postoperative nausea and vomiting)   . Scoliosis   . Ulcer     Patient Active Problem List   Diagnosis Date Noted  . Nasal congestion 02/04/2020  . Sore throat 01/25/2020  . Neck pain 08/19/2019  . B12 deficiency 08/19/2019  . Knee pain 02/04/2019  . Memory change 02/04/2019  . Radiculopathy of cervical spine  04/20/2017  . 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 Ear pain, left 03/27/2017  . Right arm pain 11/12/2016  . Vertigo 09/02/2016  . Left low back pain 05/22/2015  . Left hip pain 05/22/2015  . SOB (shortness of breath) 11/11/2014  . Right shoulder pain 07/16/2014  . Health care maintenance 06/03/2014  . Arm skin lesion, left 06/18/2013  . URI (upper respiratory infection) 03/03/2013  . Hyperglycemia 11/12/2012  . Hypercholesterolemia 11/12/2012  . Essential hypertension, benign 05/12/2012  . GERD (gastroesophageal reflux disease) 05/12/2012  . Gastritis 05/12/2012  . History of colonic polyps 05/12/2012  . Asthma 12/13/2011  . Chronic restrictive lung disease 05/11/2011    Past Surgical History:  Procedure Laterality Date  . ABDOMINAL HYSTERECTOMY    . ANTERIOR CERVICAL DECOMP/DISCECTOMY FUSION N/A 04/19/2017   Procedure: Anterior Cervical Decompression Fusion Cervical Three-Four, Cervical Four-Five, Cervical Five-Six;  Surgeon: Kary Kos, MD;  Location: [place];  Service: Neurosurgery;  Laterality: N/A;  Anterior Cervical Decompression Fusion Cervical Three-Four, Cervical Four-Five, Cervical Five-Six  . CHOLECYSTECTOMY  1996  . DILATION AND CURETTAGE OF UTERUS  1971  . LAPAROSCOPIC REMOVAL OF MESENTERIC MASS    . [place]  . PARTIAL HYSTERECTOMY  1981   prolapsed uterus  . TUBAL LIGATION  1978    OB History   No obstetric  history on file.      Home Medications    Prior to Admission medications   Medication Sig Start Date End Date Taking? Authorizing Provider  citalopram (CELEXA) 40 MG tablet Take 1 tablet (40 mg total) by mouth daily. 03/05/20  Yes [name], MD  clonazePAM (KLONOPIN) 0.5 MG tablet Take 1/2 - 1 tablet q day prn. 01/16/20  Yes [name], MD  fluticasone (FLONASE) 50 MCG/ACT nasal spray Place 2 sprays into both nostrils daily as needed for rhinitis. 01/30/20  Yes [name], MD  hydrochlorothiazide  (HYDRODIURIL) 25 MG tablet Take 1 tablet (25 mg total) by mouth daily. 02/13/20  Yes [name], MD  ibuprofen (ADVIL,MOTRIN) 200 MG tablet Take 200 mg by mouth 2 (two) times daily as needed for moderate pain.   Yes [provider]  ipratropium (ATROVENT) 0.02 % nebulizer solution Take 2.5 mLs (0.5 mg total) by nebulization every 6 (six) hours as needed for shortness of breath. 06/20/19  Yes [name], MD  levalbuterol [facility] HFA) 45 MCG/ACT inhaler Inhale 1-2 puffs into the lungs every 6 (six) hours as needed for wheezing or shortness of breath. 07/12/18  Yes [name], MD  meclizine (ANTIVERT) 25 MG tablet Take 1 tablet (25 mg total) by mouth 3 (three) times daily as needed for up to 7 days for dizziness. 03/14/20 03/21/20 Yes Danton Clap, PA-C  Multiple Vitamin (MULTIVITAMIN WITH MINERALS) TABS tablet Take 1 tablet by mouth daily.   Yes [provider]  OXYGEN Inhale into the lungs. 2 liters by nasal cannula at bedtime   Yes [provider]  pravastatin (PRAVACHOL) 20 MG tablet Take 1 tablet (20 mg total) by mouth daily. 11/27/19  Yes [name], MD  Probiotic Product (PROBIOTIC DAILY PO) Take 1 capsule by mouth daily.    Yes [provider]  SPIRIVA HANDIHALER 18 MCG inhalation capsule Place 1 capsule (18 mcg total) into inhaler and inhale daily. 02/13/20  Yes [name], MD  amoxicillin (AMOXIL) 875 MG tablet Take 1 tablet (875 mg total) by mouth 2 (two) times daily. 01/30/20   [name], MD  diazepam (VALIUM) 2 MG tablet Take PO TID PRN dizziness/vertigo 03/14/20  Yes Laurene Footman B, PA-C  mupirocin ointment (BACTROBAN) 2 % Apply to affected area bid Patient taking differently: Apply 1 application topically daily as needed (irritation). 03/24/17   [name], MD    Family History Family History  Problem Relation Age of Onset  . Stroke Mother   . Cancer Mother        Brain   . CVA Maternal Grandmother   . Cancer Father    . Breast cancer Neg Hx   . Colon cancer Neg Hx     Social History Social History   Tobacco Use  . Smoking status: Never Smoker  . Smokeless tobacco: Never Used  Vaping Use  . Vaping Use: Never used  Substance Use Topics  . Alcohol use: No    Alcohol/week: 0.0 standard drinks  . Drug use: No     Allergies   Advair diskus [fluticasone-salmeterol] and Codeine   Review of Systems Review of Systems  Constitutional: Negative for chills, diaphoresis, fatigue and fever.  HENT: Negative for congestion, ear pain, rhinorrhea, sinus pressure, sinus pain and sore throat.   Eyes: Negative for photophobia and visual disturbance.  Respiratory: Negative for cough and shortness of breath.   Cardiovascular: Negative for chest pain.  Gastrointestinal: Negative for abdominal pain, nausea and vomiting.  Musculoskeletal:  Negative for arthralgias and myalgias.  Skin: Negative for rash.  Neurological: Positive for dizziness. Negative for syncope, facial asymmetry, weakness, numbness and headaches.  Hematological: Negative for adenopathy.     Physical Exam Triage Vital Signs ED Triage Vitals  Enc Vitals Group     BP 03/14/20 1129 111/66     Pulse Rate 03/14/20 1129 71     Resp 03/14/20 1129 18     Temp 03/14/20 1129 97.9 F (36.6 C)     Temp Source 03/14/20 1129 Oral     SpO2 03/14/20 1129 96 %     Weight 03/14/20 1129 146 lb (66.2 kg)     Height 03/14/20 1129 5\' 1"  (1.549 m)     Head Circumference --      Peak Flow --      Pain Score 03/14/20 1128 0     Pain Loc --      Pain Edu? --      Excl. in [place]? --    No data found.  Updated Vital Signs BP 111/66 (BP Location: Left Arm)   Pulse 71   Temp 97.9 F (36.6 C) (Oral)   Resp 18   Ht 5\' 1"  (1.549 m)   Wt 146 lb (66.2 kg)   SpO2 96%   BMI 27.59 kg/m        Physical Exam Vitals and nursing note reviewed.  Constitutional:      General: She is not in acute distress.    Appearance: Normal appearance. She is not  ill-appearing or toxic-appearing.  HENT:     Head: Normocephalic and atraumatic.     Right Ear: Tympanic membrane, ear canal and external ear normal.     Left Ear: Tympanic membrane, ear canal and external ear normal.     Nose: Nose normal.     Mouth/Throat:     Mouth: Mucous membranes are moist.     Pharynx: Oropharynx is clear.  Eyes:     General: No scleral icterus.       Right eye: No discharge.        Left eye: No discharge.     Conjunctiva/sclera: Conjunctivae normal.  Cardiovascular:     Rate and Rhythm: Normal rate and regular rhythm.     Heart sounds: Normal heart sounds.  Pulmonary:     Effort: Pulmonary effort is normal. No respiratory distress.     Breath sounds: Normal breath sounds.  Musculoskeletal:     Cervical back: Neck supple.  Skin:    General: Skin is dry.  Neurological:     General: No focal deficit present.     Mental Status: She is alert and oriented to person, place, and time. Mental status is at baseline.     Cranial Nerves: No cranial nerve deficit.     Sensory: No sensory deficit.     Motor: No weakness.     Coordination: Coordination normal.     Gait: Gait normal.     Deep Tendon Reflexes: Reflexes normal.     Comments: Positive Dix-Hallpike on the left  Psychiatric:        Mood and Affect: Mood normal.        Behavior: Behavior normal.        Thought Content: Thought content normal.      UC Treatments / Results  Labs (all labs ordered are listed, but only abnormal results are displayed) Labs Reviewed - No data to display  EKG   Radiology No results found.  Procedures Procedures (including critical care time)  Medications Ordered in UC Medications - No data to display  Initial Impression / Assessment and Plan / UC Course  I have reviewed the triage vital signs and the nursing notes.  Pertinent labs & imaging results that were available during my care of the patient were reviewed by me and considered in my medical decision  making (see chart for details).   75 year old female presenting for positional vertigo.  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  She says that her symptoms are consistent with typical flareup she gets and she has no new symptoms.  No red flag signs or symptoms reported by patient.  All vital signs are stable.  She is in no acute distress and is overall well-appearing.  Exam significant for positive Dix-Hallpike to the left.  The neurological exam is otherwise normal.  Her chest is clear to auscultation heart regular rate and rhythm.  Patient would like to try prescription meclizine to see if it will work better than the over-the-counter prescription she picked up.  She says she was given that medication here a couple years ago and it worked for her.  I did provide her with a backup option of Valium if the meclizine is not helping.  I did review the controlled substance database.  Patient takes Klonopin as needed and I did advise her not to take this medication if she does take the Valium.  Advised her to be careful with medication as it can make her little drowsy.  Patient given instructions on how to perform the Epley maneuver at home.  I also advised her to call her physical therapy office and ask about vestibular rehab.  She says she is gone to vestibular rehab in the past and it is worked very well for her.  I did review ED precautions with patient, especially she develops any red flag signs or symptoms.   Final Clinical Impressions(s) / UC Diagnoses   Final diagnoses:  Vertigo     Discharge Instructions     Try the meclizine for your dizziness and Epley maneuvers at home. If not helping in a couple of days, start the Valium, but do not take klonopin with this. Be careful with this medication as it can make you a little tired. Contact your PT office and ask about vestibular rehab or if they can advise a place you  can get vestibular rehab. For any sudden or severe worsening of symptoms, go to ED.    ED Prescriptions    Medication Sig Dispense Auth. Provider   meclizine (ANTIVERT) 25 MG tablet Take 1 tablet (25 mg total) by mouth 3 (three) times daily as needed for up to 7 days for dizziness. 20 tablet Laurene Footman B, PA-C   diazepam (VALIUM) 2 MG tablet Take PO TID PRN dizziness/vertigo 9 tablet Danton Clap, PA-C     PDMP not reviewed this encounter.   Danton Clap, PA-C 03/14/20 1230

## 2020-03-14 NOTE — ED Triage Notes (Signed)
Patient in today c/o dizziness and vertigo x 2 days. Patient has a  history of vertigo. Patient has taken OTC Meclizine without relief.

## 2020-03-16 ENCOUNTER — Other Ambulatory Visit: Payer: Self-pay | Admitting: Internal Medicine

## 2020-03-16 DIAGNOSIS — J449 Chronic obstructive pulmonary disease, unspecified: Secondary | ICD-10-CM | POA: Diagnosis not present

## 2020-03-19 ENCOUNTER — Ambulatory Visit: Payer: Medicare Other

## 2020-03-19 DIAGNOSIS — M542 Cervicalgia: Secondary | ICD-10-CM | POA: Diagnosis not present

## 2020-03-20 ENCOUNTER — Other Ambulatory Visit: Payer: Self-pay

## 2020-03-20 ENCOUNTER — Ambulatory Visit (INDEPENDENT_AMBULATORY_CARE_PROVIDER_SITE_OTHER): Payer: Medicare Other

## 2020-03-20 DIAGNOSIS — E538 Deficiency of other specified B group vitamins: Secondary | ICD-10-CM

## 2020-03-20 MED ORDER — CYANOCOBALAMIN 1000 MCG/ML IJ SOLN
1000.0000 ug | Freq: Once | INTRAMUSCULAR | Status: AC
  Administered 2020-03-20: 1000 ug via INTRAMUSCULAR

## 2020-03-20 NOTE — Progress Notes (Signed)
Jennifer [name] presents today for injection per MD orders. B12 injection  administered IM in left Upper Arm. Administration without incident. Patient tolerated well.  Nina,cma

## 2020-03-25 DIAGNOSIS — M542 Cervicalgia: Secondary | ICD-10-CM | POA: Diagnosis not present

## 2020-03-27 DIAGNOSIS — M542 Cervicalgia: Secondary | ICD-10-CM | POA: Diagnosis not present

## 2020-04-01 DIAGNOSIS — M542 Cervicalgia: Secondary | ICD-10-CM | POA: Diagnosis not present

## 2020-04-04 DIAGNOSIS — M542 Cervicalgia: Secondary | ICD-10-CM | POA: Diagnosis not present

## 2020-04-13 DIAGNOSIS — J449 Chronic obstructive pulmonary disease, unspecified: Secondary | ICD-10-CM | POA: Diagnosis not present

## 2020-04-17 ENCOUNTER — Other Ambulatory Visit: Payer: Self-pay | Admitting: Internal Medicine

## 2020-04-23 ENCOUNTER — Other Ambulatory Visit: Payer: Self-pay

## 2020-04-23 ENCOUNTER — Ambulatory Visit (INDEPENDENT_AMBULATORY_CARE_PROVIDER_SITE_OTHER): Payer: Medicare Other

## 2020-04-23 DIAGNOSIS — E538 Deficiency of other specified B group vitamins: Secondary | ICD-10-CM

## 2020-04-23 MED ORDER — CYANOCOBALAMIN 1000 MCG/ML IJ SOLN
1000.0000 ug | Freq: Once | INTRAMUSCULAR | Status: AC
  Administered 2020-04-23: 1000 ug via INTRAMUSCULAR

## 2020-04-23 NOTE — Progress Notes (Signed)
Patient presented for B 12 injection to left deltoid, patient voiced no concerns nor showed any signs of distress during injection. 

## 2020-05-01 ENCOUNTER — Other Ambulatory Visit: Payer: Self-pay | Admitting: Internal Medicine

## 2020-05-01 NOTE — Telephone Encounter (Signed)
[facility] Refill:klonopin Last Seen:01-30-20 Last ordered:01-16-20

## 2020-05-01 NOTE — Telephone Encounter (Signed)
rx ok'd for clonazepam #30 with 1 refill.

## 2020-05-14 DIAGNOSIS — J449 Chronic obstructive pulmonary disease, unspecified: Secondary | ICD-10-CM | POA: Diagnosis not present

## 2020-05-15 ENCOUNTER — Other Ambulatory Visit: Payer: Self-pay | Admitting: Internal Medicine

## 2020-05-23 ENCOUNTER — Ambulatory Visit (INDEPENDENT_AMBULATORY_CARE_PROVIDER_SITE_OTHER): Payer: Medicare Other

## 2020-05-23 ENCOUNTER — Other Ambulatory Visit: Payer: Self-pay

## 2020-05-23 DIAGNOSIS — E538 Deficiency of other specified B group vitamins: Secondary | ICD-10-CM

## 2020-05-23 MED ORDER — CYANOCOBALAMIN 1000 MCG/ML IJ SOLN
1000.0000 ug | Freq: Once | INTRAMUSCULAR | Status: AC
  Administered 2020-05-23: 1000 ug via INTRAMUSCULAR

## 2020-05-23 NOTE — Progress Notes (Signed)
Patient presented for B 12 injection to left deltoid, patient voiced no concerns nor showed any signs of distress during injection. 

## 2020-05-27 ENCOUNTER — Other Ambulatory Visit: Payer: Self-pay | Admitting: Internal Medicine

## 2020-06-13 DIAGNOSIS — J449 Chronic obstructive pulmonary disease, unspecified: Secondary | ICD-10-CM | POA: Diagnosis not present

## 2020-06-18 ENCOUNTER — Other Ambulatory Visit: Payer: Self-pay | Admitting: Internal Medicine

## 2020-06-25 ENCOUNTER — Ambulatory Visit (INDEPENDENT_AMBULATORY_CARE_PROVIDER_SITE_OTHER): Payer: Medicare Other

## 2020-06-25 ENCOUNTER — Other Ambulatory Visit: Payer: Self-pay

## 2020-06-25 DIAGNOSIS — E538 Deficiency of other specified B group vitamins: Secondary | ICD-10-CM | POA: Diagnosis not present

## 2020-06-25 MED ORDER — CYANOCOBALAMIN 1000 MCG/ML IJ SOLN
1000.0000 ug | Freq: Once | INTRAMUSCULAR | Status: AC
  Administered 2020-06-25: 1000 ug via INTRAMUSCULAR

## 2020-06-25 NOTE — Progress Notes (Signed)
Patient presented for B 12 injection to left deltoid, patient voiced no concerns nor showed any signs of distress during injection. 

## 2020-07-11 DIAGNOSIS — M542 Cervicalgia: Secondary | ICD-10-CM | POA: Diagnosis not present

## 2020-07-12 ENCOUNTER — Other Ambulatory Visit: Payer: Self-pay | Admitting: Internal Medicine

## 2020-07-14 DIAGNOSIS — J449 Chronic obstructive pulmonary disease, unspecified: Secondary | ICD-10-CM | POA: Diagnosis not present

## 2020-07-26 ENCOUNTER — Ambulatory Visit: Payer: Medicare Other

## 2020-07-29 ENCOUNTER — Ambulatory Visit (INDEPENDENT_AMBULATORY_CARE_PROVIDER_SITE_OTHER): Payer: Medicare Other

## 2020-07-29 ENCOUNTER — Other Ambulatory Visit: Payer: Self-pay

## 2020-07-29 DIAGNOSIS — E538 Deficiency of other specified B group vitamins: Secondary | ICD-10-CM

## 2020-07-29 MED ORDER — CYANOCOBALAMIN 1000 MCG/ML IJ SOLN
1000.0000 ug | Freq: Once | INTRAMUSCULAR | Status: AC
  Administered 2020-07-29: 1000 ug via INTRAMUSCULAR

## 2020-07-29 NOTE — Progress Notes (Signed)
Patient presented for B 12 injection to right deltoid, patient voiced no concerns nor showed any signs of distress during injection. 

## 2020-08-13 ENCOUNTER — Other Ambulatory Visit: Payer: Self-pay

## 2020-08-13 ENCOUNTER — Other Ambulatory Visit (INDEPENDENT_AMBULATORY_CARE_PROVIDER_SITE_OTHER): Payer: Medicare Other

## 2020-08-13 ENCOUNTER — Other Ambulatory Visit: Payer: Self-pay | Admitting: Internal Medicine

## 2020-08-13 DIAGNOSIS — J449 Chronic obstructive pulmonary disease, unspecified: Secondary | ICD-10-CM | POA: Diagnosis not present

## 2020-08-13 DIAGNOSIS — E7800 Pure hypercholesterolemia, unspecified: Secondary | ICD-10-CM | POA: Diagnosis not present

## 2020-08-13 DIAGNOSIS — R739 Hyperglycemia, unspecified: Secondary | ICD-10-CM | POA: Diagnosis not present

## 2020-08-13 DIAGNOSIS — I10 Essential (primary) hypertension: Secondary | ICD-10-CM

## 2020-08-13 LAB — CBC WITH DIFFERENTIAL/PLATELET
Basophils Absolute: 0.1 10*3/uL (ref 0.0–0.1)
Basophils Relative: 1 % (ref 0.0–3.0)
Eosinophils Absolute: 0.4 10*3/uL (ref 0.0–0.7)
Eosinophils Relative: 5.2 % — ABNORMAL HIGH (ref 0.0–5.0)
HCT: 42.9 % (ref 36.0–46.0)
Hemoglobin: 14.3 g/dL (ref 12.0–15.0)
Lymphocytes Relative: 25.8 % (ref 12.0–46.0)
Lymphs Abs: 2.1 10*3/uL (ref 0.7–4.0)
MCHC: 33.3 g/dL (ref 30.0–36.0)
MCV: 95.1 fl (ref 78.0–100.0)
Monocytes Absolute: 0.5 10*3/uL (ref 0.1–1.0)
Monocytes Relative: 6.6 % (ref 3.0–12.0)
Neutro Abs: 5.1 10*3/uL (ref 1.4–7.7)
Neutrophils Relative %: 61.4 % (ref 43.0–77.0)
Platelets: 263 10*3/uL (ref 150.0–400.0)
RBC: 4.51 Mil/uL (ref 3.87–5.11)
RDW: 13.2 % (ref 11.5–15.5)
WBC: 8.3 10*3/uL (ref 4.0–10.5)

## 2020-08-13 LAB — LIPID PANEL
Cholesterol: 190 mg/dL (ref 0–200)
HDL: 67.9 mg/dL (ref >39.00)
LDL Cholesterol: 97 mg/dL (ref 0–99)
NonHDL: 121.74
Total CHOL/HDL Ratio: 3
Triglycerides: 125 mg/dL (ref 0.0–149.0)
VLDL: 25 mg/dL (ref 0.0–40.0)

## 2020-08-13 LAB — BASIC METABOLIC PANEL
BUN: 8 mg/dL (ref 6–23)
CO2: 35 mEq/L — ABNORMAL HIGH (ref 19–32)
Calcium: 9.4 mg/dL (ref 8.4–10.5)
Chloride: 97 mEq/L (ref 96–112)
Creatinine, Ser: 0.57 mg/dL (ref 0.40–1.20)
GFR: 90.07 mL/min (ref >60.00)
Glucose, Bld: 92 mg/dL (ref 70–99)
Potassium: 3.7 mEq/L (ref 3.5–5.1)
Sodium: 139 mEq/L (ref 135–145)

## 2020-08-13 LAB — HEPATIC FUNCTION PANEL
ALT: 14 U/L (ref 0–35)
AST: 23 U/L (ref 0–37)
Albumin: 4.2 g/dL (ref 3.5–5.2)
Alkaline Phosphatase: 56 U/L (ref 39–117)
Bilirubin, Direct: 0.1 mg/dL (ref 0.0–0.3)
Total Bilirubin: 0.6 mg/dL (ref 0.2–1.2)
Total Protein: 6.5 g/dL (ref 6.0–8.3)

## 2020-08-13 LAB — HEMOGLOBIN A1C: Hgb A1c MFr Bld: 5.7 % (ref 4.6–6.5)

## 2020-08-15 ENCOUNTER — Encounter: Payer: Self-pay | Admitting: Internal Medicine

## 2020-08-15 ENCOUNTER — Ambulatory Visit (INDEPENDENT_AMBULATORY_CARE_PROVIDER_SITE_OTHER): Payer: Medicare Other | Admitting: Internal Medicine

## 2020-08-15 ENCOUNTER — Other Ambulatory Visit: Payer: Self-pay

## 2020-08-15 VITALS — BP 116/60 | HR 79 | Temp 98.1°F | Ht 61.0 in | Wt 148.6 lb

## 2020-08-15 DIAGNOSIS — K2100 Gastro-esophageal reflux disease with esophagitis, without bleeding: Secondary | ICD-10-CM

## 2020-08-15 DIAGNOSIS — J984 Other disorders of lung: Secondary | ICD-10-CM | POA: Diagnosis not present

## 2020-08-15 DIAGNOSIS — J4520 Mild intermittent asthma, uncomplicated: Secondary | ICD-10-CM

## 2020-08-15 DIAGNOSIS — E7800 Pure hypercholesterolemia, unspecified: Secondary | ICD-10-CM

## 2020-08-15 DIAGNOSIS — I10 Essential (primary) hypertension: Secondary | ICD-10-CM | POA: Diagnosis not present

## 2020-08-15 DIAGNOSIS — E538 Deficiency of other specified B group vitamins: Secondary | ICD-10-CM

## 2020-08-15 DIAGNOSIS — M542 Cervicalgia: Secondary | ICD-10-CM

## 2020-08-15 DIAGNOSIS — Z86010 Personal history of colonic polyps: Secondary | ICD-10-CM | POA: Diagnosis not present

## 2020-08-15 DIAGNOSIS — R739 Hyperglycemia, unspecified: Secondary | ICD-10-CM

## 2020-08-15 LAB — VITAMIN B12: Vitamin B-12: 603 pg/mL (ref 211–911)

## 2020-08-15 NOTE — Progress Notes (Signed)
[name], female   DOB: [birth year], 75 y.o.   MRN: [id number]   Subjective:    [name], female    DOB: 05-Oct-1945, 75 y.o.   MRN: [id number]  HPI This visit occurred during the SARS-CoV-2 public health emergency.  Safety protocols were in place, including screening questions prior to the visit, additional usage of staff PPE, and extensive cleaning of exam room while observing appropriate contact time as indicated for disinfecting solutions.   Patient here for a scheduled follow up. Here to follow up regarding her blood pressure, breathing and cholesterol.  She reports she is doing relatively well.  Tries to stay active.  Has been having issues with her neck and shoulders.  Has seen [name] neurosurgery.  On gabapentin.  S/p left shoulder injection.  No chest pain.  Breathing stable.  Notices some change in her breathing when out in hot and humid weather.  Overall stable.  No increased cough or congestion.   No abdominal pain.  Bowels moving.    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   Gastritis    GERD (gastroesophageal reflux disease)    Hypertension    PONV (postoperative nausea and vomiting)    Scoliosis    Ulcer    Past Surgical History:  Procedure Laterality Date   ABDOMINAL HYSTERECTOMY     ANTERIOR CERVICAL DECOMP/DISCECTOMY FUSION N/A 04/19/2017   Procedure: Anterior Cervical Decompression Fusion Cervical Three-Four, Cervical Four-Five, Cervical Five-Six;  Surgeon: Kary Kos, MD;  Location: [place];  Service: Neurosurgery;  Laterality: N/A;  Anterior Cervical Decompression Fusion Cervical Three-Four, Cervical Four-Five, Cervical Five-Six   CHOLECYSTECTOMY  1996   DILATION AND CURETTAGE OF UTERUS  1971   LAPAROSCOPIC REMOVAL OF MESENTERIC MASS     LUMBAR [place]   [facility]   PARTIAL HYSTERECTOMY  1981    prolapsed uterus   TUBAL LIGATION  1978   Family History  Problem Relation Age of Onset   Stroke Mother    Cancer Mother        Brain    CVA Maternal Grandmother    Cancer Father    Breast cancer Neg Hx    Colon cancer Neg Hx    Social History   Socioeconomic History   Marital status: Married    Spouse name: Not on file   Number of children: 2   Years of education: Not on file   Highest education level: Not on file  Occupational History   Occupation: retired Pharmacist, hospital  Tobacco Use   Smoking status: Never   Smokeless tobacco: Never  Vaping Use   Vaping Use: Never used  Substance and Sexual Activity   Alcohol use: No    Alcohol/week: 0.0 standard drinks   Drug use: No   Sexual activity: Not on file  Other Topics Concern   Not on file  Social History Narrative   Not on file   Social Determinants of Health   Financial Resource Strain: Not on file  Food Insecurity: Not on file  Transportation Needs: Not on file  Physical Activity: Not on file  Stress: Not on file  Social Connections: Not on file    Review of Systems  Constitutional:  Negative for appetite change and unexpected  weight change.  HENT:  Negative for congestion and sinus pressure.   Respiratory:  Negative for cough and chest tightness.        Breathing stable.   Cardiovascular:  Negative for chest pain, palpitations and leg swelling.  Gastrointestinal:  Negative for abdominal pain, diarrhea, nausea and vomiting.  Genitourinary:  Negative for difficulty urinating and dysuria.  Musculoskeletal:  Negative for myalgias.       Neck and shoulder issues as outlined.    Skin:  Negative for color change and rash.  Neurological:  Negative for dizziness, light-headedness and headaches.  Psychiatric/Behavioral:  Negative for agitation and dysphoric mood.       Objective:    Physical Exam Vitals reviewed.  Constitutional:      General: She is not in acute distress.    Appearance: Normal appearance.  HENT:      Head: Normocephalic and atraumatic.     Right Ear: External ear normal.     Left Ear: External ear normal.  Eyes:     General: No scleral icterus.       Right eye: No discharge.        Left eye: No discharge.     Conjunctiva/sclera: Conjunctivae normal.  Neck:     Thyroid: No thyromegaly.  Cardiovascular:     Rate and Rhythm: Normal rate and regular rhythm.  Pulmonary:     Effort: No respiratory distress.     Breath sounds: Normal breath sounds. No wheezing.  Abdominal:     General: Bowel sounds are normal.     Palpations: Abdomen is soft.     Tenderness: There is no abdominal tenderness.  Musculoskeletal:        General: No swelling or tenderness.     Cervical back: Neck supple. No tenderness.  Lymphadenopathy:     Cervical: No cervical adenopathy.  Skin:    Findings: No erythema or rash.  Neurological:     Mental Status: She is alert.  Psychiatric:        Mood and Affect: Mood normal.        Behavior: Behavior normal.    BP 116/60 (BP Location: Left Arm, Patient Position: Sitting, Cuff Size: Large)   Pulse 79   Temp 98.1 F (36.7 C) (Oral)   Ht _0  (1.549 m)   Wt 148 lb 9.6 oz (67.4 kg)   SpO2 96%   BMI 28.08 kg/m  Wt Readings from Last 3 Encounters:  08/15/20 148 lb 9.6 oz (67.4 kg)  03/14/20 146 lb (66.2 kg)  01/25/20 145 lb (65.8 kg)    Outpatient Encounter Medications as of 08/15/2020  Medication Sig   citalopram (CELEXA) 40 MG tablet Take 1 tablet (40 mg total) by mouth daily.   clonazePAM (KLONOPIN) 0.5 MG tablet TAKE 1/2 TO 1 TABLET DAILY AS NEEDED. (Patient taking differently: Take 0.25 mg by mouth. TAKE 1/2 TO 1 TABLET DAILY AS NEEDED.)   hydrochlorothiazide (HYDRODIURIL) 25 MG tablet Take 1 tablet (25 mg total) by mouth daily.   levalbuterol (XOPENEX HFA) 45 MCG/ACT inhaler Inhale 1-2 puffs into the lungs every 6 (six) hours as needed for wheezing or shortness of breath.   Multiple Vitamin (MULTIVITAMIN WITH MINERALS) TABS tablet Take 1 tablet  by mouth daily.   OXYGEN Inhale into the lungs. 2 liters by nasal cannula at bedtime   pravastatin (PRAVACHOL) 20 MG tablet Take 1 tablet (20 mg total) by mouth daily.   SPIRIVA HANDIHALER 18 MCG inhalation capsule Place 1 capsule (18  mcg total) into inhaler and inhale daily.   [DISCONTINUED] amoxicillin (AMOXIL) 875 MG tablet Take 1 tablet (875 mg total) by mouth 2 (two) times daily. (Patient not taking: Reported on 08/15/2020)   [DISCONTINUED] diazepam (VALIUM) 2 MG tablet Take PO TID PRN dizziness/vertigo (Patient not taking: Reported on 08/15/2020)   [DISCONTINUED] fluticasone (FLONASE) 50 MCG/ACT nasal spray Place 2 sprays into both nostrils daily as needed for rhinitis. (Patient not taking: Reported on 08/15/2020)   [DISCONTINUED] ibuprofen (ADVIL,MOTRIN) 200 MG tablet Take 200 mg by mouth 2 (two) times daily as needed for moderate pain. (Patient not taking: Reported on 08/15/2020)   [DISCONTINUED] ipratropium (ATROVENT) 0.02 % nebulizer solution Take 2.5 mLs (0.5 mg total) by nebulization every 6 (six) hours as needed for shortness of breath. (Patient not taking: Reported on 08/15/2020)   [DISCONTINUED] mupirocin ointment (BACTROBAN) 2 % Apply to affected area bid (Patient not taking: Reported on 08/15/2020)   [DISCONTINUED] Probiotic Product (PROBIOTIC DAILY PO) Take 1 capsule by mouth daily.  (Patient not taking: Reported on 08/15/2020)   No facility-administered encounter medications on file as of 08/15/2020.     Lab Results  Component Value Date   WBC 8.3 08/13/2020   HGB 14.3 08/13/2020   HCT 42.9 08/13/2020   PLT 263.0 08/13/2020   GLUCOSE 92 08/13/2020   CHOL 190 08/13/2020   TRIG 125.0 08/13/2020   HDL 67.90 08/13/2020   LDLDIRECT 148.4 11/03/2012   LDLCALC 97 08/13/2020   ALT 14 08/13/2020   AST 23 08/13/2020   NA 139 08/13/2020   K 3.7 08/13/2020   CL 97 08/13/2020   CREATININE 0.57 08/13/2020   BUN 8 08/13/2020   CO2 35 (H) 08/13/2020   TSH 1.28 03/02/2019   HGBA1C 5.7  08/13/2020       Assessment & Plan:   Problem List Items Addressed This Visit     Asthma    Continue spiriva.  Breathing stable.         B12 deficiency - Primary    Check B12 level.        Relevant Orders   Vitamin B12 (Completed)   Chronic restrictive lung disease    Breathing stable.         Essential hypertension, benign    Blood pressure has been doing well.  On hctz.  Follow pressures.         Relevant Orders   TSH   Basic metabolic panel   GERD (gastroesophageal reflux disease)    History of gastritis and varices.  On PPI.  F/u with GI.          History of colonic polyps    Colonoscopy 02/2016.        Hypercholesterolemia    On pravastatin.  Low cholesterol diet and exercise.  Follow lipid panel and liver function tests.         Relevant Orders   Lipid panel   Hepatic function panel   Hyperglycemia    Low carb diet and exercise.  Follow met b and a1c.        Relevant Orders   Hemoglobin A1c   Neck pain    Seeing [facility].  Discussed gabapentin.  Follow.          [name], MD

## 2020-08-16 ENCOUNTER — Ambulatory Visit: Payer: Medicare Other

## 2020-08-20 ENCOUNTER — Encounter: Payer: Self-pay | Admitting: Internal Medicine

## 2020-08-20 NOTE — Assessment & Plan Note (Signed)
Colonoscopy 02/2016.  

## 2020-08-20 NOTE — Assessment & Plan Note (Signed)
Seeing [facility].  Discussed gabapentin.  Follow.

## 2020-08-20 NOTE — Assessment & Plan Note (Signed)
History of gastritis and varices.  On PPI.  F/u with GI.    

## 2020-08-20 NOTE — Assessment & Plan Note (Signed)
Low carb diet and exercise.  Follow met b and a1c.  

## 2020-08-20 NOTE — Assessment & Plan Note (Signed)
Check B12 level. 

## 2020-08-20 NOTE — Assessment & Plan Note (Signed)
Breathing stable.

## 2020-08-20 NOTE — Assessment & Plan Note (Signed)
Continue spiriva. Breathing stable. 

## 2020-08-20 NOTE — Assessment & Plan Note (Signed)
On pravastatin.  Low cholesterol diet and exercise.  Follow lipid panel and liver function tests.   

## 2020-08-20 NOTE — Assessment & Plan Note (Signed)
Blood pressure has been doing well.  On hctz.  Follow pressures.

## 2020-08-21 ENCOUNTER — Other Ambulatory Visit: Payer: Self-pay | Admitting: Internal Medicine

## 2020-08-23 ENCOUNTER — Ambulatory Visit: Payer: Medicare Other

## 2020-08-29 ENCOUNTER — Other Ambulatory Visit: Payer: Self-pay | Admitting: Internal Medicine

## 2020-08-29 NOTE — Telephone Encounter (Signed)
RX Refill:klonopin Last Seen:08-15-20 Last ordered:05-01-20

## 2020-08-30 ENCOUNTER — Other Ambulatory Visit: Payer: Self-pay | Admitting: Internal Medicine

## 2020-08-30 DIAGNOSIS — Z1231 Encounter for screening mammogram for malignant neoplasm of breast: Secondary | ICD-10-CM

## 2020-09-02 DIAGNOSIS — J4530 Mild persistent asthma, uncomplicated: Secondary | ICD-10-CM | POA: Diagnosis not present

## 2020-09-10 ENCOUNTER — Ambulatory Visit (INDEPENDENT_AMBULATORY_CARE_PROVIDER_SITE_OTHER): Payer: Medicare Other

## 2020-09-10 VITALS — Ht 61.0 in | Wt 148.0 lb

## 2020-09-10 DIAGNOSIS — Z0000 Encounter for general adult medical examination without abnormal findings: Secondary | ICD-10-CM

## 2020-09-10 NOTE — Progress Notes (Addendum)
Subjective:   Jennifer [name] is a 75 y.o. female who presents for Medicare Annual (Subsequent) preventive examination.  Review of Systems    No ROS.  Medicare Wellness Virtual Visit.  Visual/audio telehealth visit, UTA vital signs.   See social history for additional risk factors.   Cardiac Risk Factors include: advanced age (>24mn, >>83women)     Objective:    Today's Vitals   09/10/20 1534  Weight: 148 lb (67.1 kg)  Height: '5\' 1"'$  (1.549 m)   Body mass index is 27.96 kg/m.  Advanced Directives 09/10/2020 08/16/2019 08/15/2018 04/25/2018 04/19/2017 04/13/2017 06/23/2016  Does Patient Have a Medical Advance Directive? Yes Yes Yes No Yes Yes Yes  Type of AParamedicof A[place]bor ViewLiving will [place] will [place] will - Living will Living will Living will;Healthcare Power of Attorney  Does patient want to make changes to medical advance directive? No - Patient declined No - Patient declined No - Patient declined - No - Patient declined No - Patient declined No - Patient declined  Copy of HKennedyin Chart? No - copy requested No - copy requested No - copy requested - - - No - copy requested    Current Medications (verified) Outpatient Encounter Medications as of 09/10/2020  Medication Sig   citalopram (CELEXA) 40 MG tablet Take 1 tablet (40 mg total) by mouth daily.   clonazePAM (KLONOPIN) 0.5 MG tablet TAKE 1/2 TO 1 TABLET DAILY AS NEEDED.   hydrochlorothiazide (HYDRODIURIL) 25 MG tablet Take 1 tablet (25 mg total) by mouth daily.   levalbuterol (XOPENEX HFA) 45 MCG/ACT inhaler Inhale 1-2 puffs into the lungs every 6 (six) hours as needed for wheezing or shortness of breath.   Multiple Vitamin (MULTIVITAMIN WITH MINERALS) TABS tablet Take 1 tablet by mouth daily.   OXYGEN Inhale into the lungs. 2 liters by nasal cannula at bedtime   pravastatin (PRAVACHOL) 20 MG tablet Take 1 tablet (20 mg  total) by mouth daily.   SPIRIVA HANDIHALER 18 MCG inhalation capsule Place 1 capsule (18 mcg total) into inhaler and inhale daily.   No facility-administered encounter medications on file as of 09/10/2020.    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   Gastritis    GERD (gastroesophageal reflux disease)    Hypertension    PONV (postoperative nausea and vomiting)    Scoliosis    Ulcer    Past Surgical History:  Procedure Laterality Date   ABDOMINAL HYSTERECTOMY     ANTERIOR CERVICAL DECOMP/DISCECTOMY FUSION N/A 04/19/2017   Procedure: Anterior Cervical Decompression Fusion Cervical Three-Four, Cervical Four-Five, Cervical Five-Six;  Surgeon: CKary Kos MD;  [facility]  Service: Neurosurgery;  Laterality: N/A;  Anterior Cervical Decompression Fusion Cervical Three-Four, Cervical Four-Five, Cervical Five-Six   CHOLECYSTECTOMY  1996   DILATION AND CURETTAGE OF UTERUS  1971   LAPAROSCOPIC REMOVAL OF MESENTERIC MASS     LUMBAR [facility]  [facility]   PARTIAL HYSTERECTOMY  1981   prolapsed uterus   TUBAL LIGATION  1978   Family History  Problem Relation Age of Onset   Stroke Mother    Cancer Mother  Brain    CVA Maternal Grandmother    Cancer Father    Breast cancer Neg Hx    Colon cancer Neg Hx    Social History   Socioeconomic History   Marital status: Married    Spouse name: Not on file   Number of children: 2   Years of education: Not on file   Highest education level: Not on file  Occupational History   Occupation: retired Pharmacist, hospital  Tobacco Use   Smoking status: Never   Smokeless tobacco: Never  Vaping Use   Vaping Use: Never used  Substance and Sexual Activity   Alcohol use: No    Alcohol/week: 0.0 standard drinks   Drug use:  No   Sexual activity: Not on file  Other Topics Concern   Not on file  Social History Narrative   Not on file   Social Determinants of Health   Financial Resource Strain: Low Risk    Difficulty of Paying Living Expenses: Not hard at all  Food Insecurity: No Food Insecurity   Worried About Charity fundraiser in the Last Year: Never true   [place] in the Last Year: Never true  Transportation Needs: No Transportation Needs   Lack of Transportation (Medical): No   Lack of Transportation (Non-Medical): No  Physical Activity: Unknown   Days of Exercise per Week: 0 days   Minutes of Exercise per Session: Not on file  Stress: No Stress Concern Present   Feeling of Stress : Not at all  Social Connections: Unknown   Frequency of Communication with Friends and Family: More than three times a week   Frequency of Social Gatherings with Friends and Family: Not on file   Attends Religious Services: Not on Electrical engineer or Organizations: Not on file   Attends Archivist Meetings: Not on file   Marital Status: Married    Tobacco Counseling Counseling given: Not Answered   Clinical Intake:  Pre-visit preparation completed: Yes        Diabetes: No  How often do you need to have someone help you when you read instructions, pamphlets, or other written materials from your doctor or pharmacy?: 1 - Never    Interpreter Needed?: No      Activities of Daily Living In your present state of health, do you have any difficulty performing the following activities: 09/10/2020  Hearing? N  Vision? N  Difficulty concentrating or making decisions? Y  Comment Hx of memory difficulty  Walking or climbing stairs? N  Dressing or bathing? N  Preparing Food and eating ? N  Using the Toilet? N  In the past six months, have you accidently leaked urine? N  Do you have problems with loss of bowel control? N  Managing your Medications? N  Managing your Finances?  N  Housekeeping or managing your Housekeeping? N  Some recent data might be hidden    Patient Care Team: [name], MD as PCP - General (Internal Medicine)  Indicate any recent Medical Services you may have received from other than [facility] providers in the past year (date may be approximate).     Assessment:   This is a routine wellness examination for Jennifer [name].  I connected with Anyelina today by telephone and verified that I am speaking with the correct person using two identifiers. Location patient: home Location provider: work Persons participating in the virtual visit: patient, Marine scientist.    I discussed the limitations,  risks, security and privacy concerns of performing an evaluation and management service by telephone and the availability of in person appointments. The patient expressed understanding and verbally consented to this telephonic visit.    Interactive audio and video telecommunications were attempted between this provider and patient, however failed, due to patient having technical difficulties OR patient did not have access to video capability.  We continued and completed visit with audio only.  Some vital signs may be absent or patient reported.   Hearing/Vision screen Hearing Screening - Comments:: Patient is able to hear conversational tones without difficulty.  No issues reported. Vision Screening - Comments:: Followed by [facility]  Wears corrective lenses  They have regular follow up with the ophthalmologist  Dietary issues and exercise activities discussed: Current Exercise Habits: Home exercise routine, Type of exercise: walking Healthy diet Good fluid intake   Goals Addressed               This Visit's Progress     Patient Stated     Increase physical activity (pt-stated)        Walk for exercise as tolerated       Depression Screen PHQ 2/9 Scores 09/10/2020 08/15/2020 12/25/2019 08/16/2019 08/15/2018 02/12/2017 06/23/2016  PHQ - 2 Score 0 0  0 0 0 0 0    Fall Risk Fall Risk  09/10/2020 12/25/2019 08/16/2019 08/15/2018 02/12/2017  Falls in the past year? 0 0 0 0 No  Number falls in past yr: 0 0 - - -  Injury with Fall? 0 0 0 - -  Follow up Falls evaluation completed Falls evaluation completed Falls evaluation completed - -    FALL RISK PREVENTION PERTAINING TO THE HOME: Adequate lighting in your home to reduce risk of falls? Yes   ASSISTIVE DEVICES UTILIZED TO PREVENT FALLS: Use of a cane, walker or w/c? No  Grab bars in the bathroom? No   TIMED UP AND GO: Was the test performed? No .   Cognitive Function: MMSE - Mini Mental State Exam 06/23/2016  Orientation to time 5  Orientation to Place 5  Registration 3  Attention/ Calculation 5  Recall 3  Language- name 2 objects 2  Language- repeat 1  Language- follow 3 step command 3  Language- read & follow direction 1  Write a sentence 1  Copy design 1  Total score 30     6CIT Screen 09/10/2020 08/16/2019 08/15/2018  What Year? 4 points 0 points 0 points  What month? 0 points 0 points 0 points  What time? - - 0 points  Count back from 20 - - 0 points  Months in reverse 0 points 0 points 0 points  Repeat phrase - 6 points 0 points  Total Score - - 0    Immunizations Immunization History  Administered Date(s) Administered   Fluad Quad(high Dose 65+) 10/12/2018   Influenza Split 09/20/2013   Influenza Whole 10/23/2016, 11/13/2016   Influenza, High Dose Seasonal PF 12/20/2017   Influenza-Unspecified 10/20/2011, 09/17/2014, 10/10/2015, 10/11/2019   Moderna Sars-Covid-2 Vaccination 02/14/2019, 03/14/2019, 11/13/2019, 06/03/2020   Pneumococcal Conjugate-13 10/29/2016   Zoster Recombinat (Shingrix) 07/09/2017, 10/28/2017   Zoster, Live 10/19/2013, 05/16/2015   TDAP status: Due, Education has been provided regarding the importance of this vaccine. Advised may receive this vaccine at local pharmacy or Health Dept. Aware to provide a copy of the vaccination record if  obtained from local pharmacy or Health Dept. Verbalized acceptance and understanding. Plans to verify with her local pharmacy  to verify last injection. Deferred.  PNA 23 vaccine- Due, Education has been provided regarding the importance of this vaccine. Advised may receive this vaccine in office, at local pharmacy or Health Dept. Aware to provide a copy of the vaccination record if obtained from local pharmacy or Health Dept. Verbalized acceptance and understanding. Plans to verify with her local pharmacy to verify last injection. Deferred.  Health Maintenance Health Maintenance  Topic Date Due   INFLUENZA VACCINE  10/27/2020 (Originally 08/19/2020)   COLONOSCOPY (Pts 45-14yr Insurance coverage will need to be confirmed)  09/10/2021 (Originally 02/25/2017)   TETANUS/TDAP  09/10/2021 (Originally 01/24/1964)   PNA vac Low Risk Adult (2 of 2 - PPSV23) 09/10/2021 (Originally 10/29/2017)   MAMMOGRAM  09/19/2020   DEXA SCAN  Completed   COVID-19 Vaccine  Completed   Hepatitis C Screening  Completed   Zoster Vaccines- Shingrix  Completed   HPV VACCINES  Aged Out   Colonoscopy-deferred per patient.   Lung Cancer Screening: (Low Dose CT Chest recommended if Age 73-80years, 30 pack-year currently smoking OR have quit w/in 15years.) does not qualify.   Vision Screening: Recommended annual ophthalmology exams for early detection of glaucoma and other disorders of the eye.  Dental Screening: Recommended annual dental exams for proper oral hygiene  Community Resource Referral / Chronic Care Management: CRR required this visit?  No   CCM required this visit?  No      Plan:   Keep all routine maintenance appointments.   I have personally reviewed and noted the following in the patient's chart:   Medical and social history Use of alcohol, tobacco or illicit drugs  Current medications and supplements including opioid prescriptions.  Functional ability and status Nutritional status Physical  activity Advanced directives List of other physicians Hospitalizations, surgeries, and ER visits in previous 12 months Vitals Screenings to include cognitive, depression, and falls Referrals and appointments  In addition, I have reviewed and discussed with patient certain preventive protocols, quality metrics, and best practice recommendations. A written personalized care plan for preventive services as well as general preventive health recommendations were provided to patient via mychart.     OVarney Biles LPN   [id number]

## 2020-09-10 NOTE — Patient Instructions (Signed)
  Ms. Jennifer [name] , Thank you for taking time to come for your Medicare Wellness Visit. I appreciate your ongoing commitment to your health goals. Please review the following plan we discussed and let me know if I can assist you in the future.   These are the goals we discussed:  Goals       Patient Stated     Increase physical activity (pt-stated)      Walk for exercise as tolerated        This is a list of the screening recommended for you and due dates:  Health Maintenance  Topic Date Due   Flu Shot  10/27/2020*   Colon Cancer Screening  09/10/2021*   Tetanus Vaccine  09/10/2021*   Pneumonia vaccines (2 of 2 - PPSV23) 09/10/2021*   Mammogram  09/19/2020   DEXA scan (bone density measurement)  Completed   COVID-19 Vaccine  Completed   Hepatitis C Screening: USPSTF Recommendation to screen - Ages 31-79 yo.  Completed   Zoster (Shingles) Vaccine  Completed   HPV Vaccine  Aged Out  *Topic was postponed. The date shown is not the original due date.

## 2020-09-13 DIAGNOSIS — J449 Chronic obstructive pulmonary disease, unspecified: Secondary | ICD-10-CM | POA: Diagnosis not present

## 2020-10-04 DIAGNOSIS — H2513 Age-related nuclear cataract, bilateral: Secondary | ICD-10-CM | POA: Diagnosis not present

## 2020-10-08 DIAGNOSIS — M542 Cervicalgia: Secondary | ICD-10-CM | POA: Diagnosis not present

## 2020-10-14 DIAGNOSIS — J449 Chronic obstructive pulmonary disease, unspecified: Secondary | ICD-10-CM | POA: Diagnosis not present

## 2020-10-21 ENCOUNTER — Other Ambulatory Visit: Payer: Self-pay

## 2020-10-21 ENCOUNTER — Ambulatory Visit
Admission: RE | Admit: 2020-10-21 | Discharge: 2020-10-21 | Disposition: A | Discharge status: Home / Self Care | Payer: Medicare Other | Source: Ambulatory Visit | Attending: Internal Medicine | Admitting: Internal Medicine

## 2020-10-21 DIAGNOSIS — Z1231 Encounter for screening mammogram for malignant neoplasm of breast: Secondary | ICD-10-CM | POA: Insufficient documentation

## 2020-10-25 ENCOUNTER — Other Ambulatory Visit: Payer: Self-pay | Admitting: Internal Medicine

## 2020-10-25 DIAGNOSIS — R928 Other abnormal and inconclusive findings on diagnostic imaging of breast: Secondary | ICD-10-CM

## 2020-10-25 NOTE — Progress Notes (Signed)
Order placed for f/u right breast mammogram and possible ultrasound.  

## 2020-10-28 ENCOUNTER — Other Ambulatory Visit: Payer: Self-pay | Admitting: Internal Medicine

## 2020-10-28 DIAGNOSIS — R928 Other abnormal and inconclusive findings on diagnostic imaging of breast: Secondary | ICD-10-CM

## 2020-10-28 DIAGNOSIS — N6310 Unspecified lump in the right breast, unspecified quadrant: Secondary | ICD-10-CM

## 2020-10-31 ENCOUNTER — Encounter: Payer: Self-pay | Admitting: Ophthalmology

## 2020-10-31 DIAGNOSIS — H2511 Age-related nuclear cataract, right eye: Secondary | ICD-10-CM | POA: Diagnosis not present

## 2020-11-04 ENCOUNTER — Encounter: Payer: Self-pay | Admitting: Anesthesiology

## 2020-11-04 ENCOUNTER — Ambulatory Visit
Admission: RE | Admit: 2020-11-04 | Discharge: 2020-11-04 | Disposition: A | Discharge status: Home / Self Care | Payer: Medicare Other | Source: Ambulatory Visit | Attending: Internal Medicine | Admitting: Internal Medicine

## 2020-11-04 ENCOUNTER — Other Ambulatory Visit: Payer: Self-pay

## 2020-11-04 DIAGNOSIS — R928 Other abnormal and inconclusive findings on diagnostic imaging of breast: Secondary | ICD-10-CM | POA: Diagnosis not present

## 2020-11-04 DIAGNOSIS — R922 Inconclusive mammogram: Secondary | ICD-10-CM | POA: Diagnosis not present

## 2020-11-05 ENCOUNTER — Other Ambulatory Visit: Payer: Self-pay | Admitting: Internal Medicine

## 2020-11-05 DIAGNOSIS — R928 Other abnormal and inconclusive findings on diagnostic imaging of breast: Secondary | ICD-10-CM

## 2020-11-05 NOTE — Progress Notes (Signed)
Order placed for surgery referral.  

## 2020-11-06 ENCOUNTER — Telehealth: Payer: Self-pay | Admitting: Internal Medicine

## 2020-11-06 NOTE — Telephone Encounter (Signed)
Advised patient that surgery referral was placed. Advised that they will call and set up the appointment with her prior to getting oncology involved. Advised to let us know if she does not hear anything.

## 2020-11-06 NOTE — Telephone Encounter (Signed)
Patient requesting a call from [place] regarding her oncologist referral and follow up.

## 2020-11-06 NOTE — Telephone Encounter (Signed)
Patient is returning Dr.Scott's call about her recent breast ultrasound results.Please call her at [phone].

## 2020-11-07 NOTE — Telephone Encounter (Signed)
Patient is calling to get the name and number of the office where she is having her breast biopsy done so her daughter can take her to the appoinment.She stated it is in [place] near [facility] but she is unsure of the location.Please call her at [phone].

## 2020-11-07 NOTE — Telephone Encounter (Signed)
Patient called in stating that she was told by [facility] to get a biopsy done where she has her mammograms so they will not have order one for her.Please call her at [phone].

## 2020-11-07 NOTE — Telephone Encounter (Signed)
Patient aware that surgery referral was sent to [facility]. Referral is being processed and gave patient name, address and telephone number of the office she will be seeing. Advised to let me know if she does not hear anything within a week

## 2020-11-08 NOTE — Telephone Encounter (Signed)
Patient is calling in to check on the status of the message below.Please call her at [phone].

## 2020-11-08 NOTE — Telephone Encounter (Signed)
Patient would like to have biopsy done in [place] prior to seeing Dr Donne Hazel so that he will have results prior to seeing her. This was recommended from their office. Can we set this up?

## 2020-11-09 NOTE — Telephone Encounter (Signed)
I called pt and discussed.  She is wanting to have biopsy is [place] through radiology.  If f/u needed, then f/u with Dr Donne Hazel.  I can place order, but need to know what order to place.  Please call scheduling (for mammogram) and see if they know what diagnosis code to use.  There are multiple diagnoses for breast biopsies.

## 2020-11-11 ENCOUNTER — Other Ambulatory Visit: Payer: Self-pay | Admitting: Internal Medicine

## 2020-11-11 DIAGNOSIS — R928 Other abnormal and inconclusive findings on diagnostic imaging of breast: Secondary | ICD-10-CM

## 2020-11-11 DIAGNOSIS — N6310 Unspecified lump in the right breast, unspecified quadrant: Secondary | ICD-10-CM

## 2020-11-11 NOTE — Telephone Encounter (Signed)
LM for Jennifer [name] with [name] to obtain biopsy code

## 2020-11-11 NOTE — Telephone Encounter (Signed)
Biopsy orders placed and pt has been scheduled for 10/27

## 2020-11-13 DIAGNOSIS — J449 Chronic obstructive pulmonary disease, unspecified: Secondary | ICD-10-CM | POA: Diagnosis not present

## 2020-11-14 ENCOUNTER — Other Ambulatory Visit: Payer: Self-pay

## 2020-11-14 ENCOUNTER — Ambulatory Visit
Admission: RE | Admit: 2020-11-14 | Discharge: 2020-11-14 | Disposition: A | Discharge status: Home / Self Care | Payer: Medicare Other | Source: Ambulatory Visit | Attending: Internal Medicine | Admitting: Internal Medicine

## 2020-11-14 DIAGNOSIS — N6310 Unspecified lump in the right breast, unspecified quadrant: Secondary | ICD-10-CM | POA: Diagnosis not present

## 2020-11-14 DIAGNOSIS — R928 Other abnormal and inconclusive findings on diagnostic imaging of breast: Secondary | ICD-10-CM

## 2020-11-14 DIAGNOSIS — C50411 Malignant neoplasm of upper-outer quadrant of right female breast: Secondary | ICD-10-CM | POA: Diagnosis not present

## 2020-11-14 HISTORY — PX: BREAST BIOPSY: SHX20

## 2020-11-18 ENCOUNTER — Telehealth: Payer: Self-pay | Admitting: Internal Medicine

## 2020-11-18 ENCOUNTER — Ambulatory Visit: Admit: 2020-11-18 | Payer: Medicare Other | Admitting: Ophthalmology

## 2020-11-18 SURGERY — PHACOEMULSIFICATION, CATARACT, WITH IOL INSERTION
Anesthesia: Topical | Laterality: Right

## 2020-11-18 NOTE — Telephone Encounter (Signed)
Patient called wanting to speak with Dr. Nicki Reaper due to her having confirmed breast cancer. Patient wants to discuss what her next steps are after finding out that she does have breast cancer.

## 2020-11-18 NOTE — Telephone Encounter (Signed)
Patient needs to know before 5 today if should take the Covid vaccine or not.

## 2020-11-18 NOTE — Telephone Encounter (Signed)
Patient was diagnosed with breast cancer and we have been dealing with this. We need to set her up with Dr Donne Hazel. I can call and schedule her for an appointment since referral has already been placed. Just wanted you to see this in case you wanted to call her.

## 2020-11-19 NOTE — Telephone Encounter (Signed)
Patient want to know since she has been diagnosed with breast cancer is it ok to go out and be around people?

## 2020-11-19 NOTE — Telephone Encounter (Signed)
Called and spoke to Jennifer [name].  Needs appt scheduled with Dr Donne Hazel.  I have already placed order for the referral. They were waiting on biopsy.  Please schedule appt - phone [phone].

## 2020-11-19 NOTE — Telephone Encounter (Signed)
Patient aware that she can continue her everyday life as she has been since she is not on any treatment to suppress her immune system. Patient advised to still wear a mask while out as she has been doing. Patient gave verbal understanding.

## 2020-11-19 NOTE — Telephone Encounter (Signed)
Patient has been scheduled with Dr Donne Hazel 11/18. Patient has been notified of appt date and time and mammogram/biopsy report has been faxed over to [facility] [name] [facility] with referral.

## 2020-11-21 ENCOUNTER — Other Ambulatory Visit: Payer: Self-pay | Admitting: Internal Medicine

## 2020-11-21 LAB — SURGICAL PATHOLOGY

## 2020-12-06 ENCOUNTER — Other Ambulatory Visit: Payer: Self-pay | Admitting: General Surgery

## 2020-12-06 DIAGNOSIS — C50411 Malignant neoplasm of upper-outer quadrant of right female breast: Secondary | ICD-10-CM | POA: Diagnosis not present

## 2020-12-06 DIAGNOSIS — Z170 Estrogen receptor positive status [ER+]: Secondary | ICD-10-CM | POA: Diagnosis not present

## 2020-12-11 ENCOUNTER — Telehealth: Payer: Self-pay | Admitting: Hematology

## 2020-12-11 ENCOUNTER — Other Ambulatory Visit: Payer: Self-pay | Admitting: General Surgery

## 2020-12-11 DIAGNOSIS — C50411 Malignant neoplasm of upper-outer quadrant of right female breast: Secondary | ICD-10-CM

## 2020-12-11 NOTE — Telephone Encounter (Signed)
Pt called back to confirm her new patient appt. She is aware of appt date and time.

## 2020-12-14 DIAGNOSIS — J449 Chronic obstructive pulmonary disease, unspecified: Secondary | ICD-10-CM | POA: Diagnosis not present

## 2020-12-17 ENCOUNTER — Telehealth: Payer: Self-pay | Admitting: Hematology

## 2020-12-17 DIAGNOSIS — C50411 Malignant neoplasm of upper-outer quadrant of right female breast: Secondary | ICD-10-CM | POA: Insufficient documentation

## 2020-12-17 NOTE — Telephone Encounter (Signed)
Moved pt's appt time per 11/29 staff msg from [employer]. Pt is aware of new appt time.

## 2020-12-18 ENCOUNTER — Inpatient Hospital Stay: Payer: Medicare Other | Attending: Hematology | Admitting: Hematology

## 2020-12-18 ENCOUNTER — Other Ambulatory Visit: Payer: Self-pay

## 2020-12-18 ENCOUNTER — Encounter: Payer: Self-pay | Admitting: Hematology

## 2020-12-18 ENCOUNTER — Other Ambulatory Visit: Payer: Self-pay | Admitting: Internal Medicine

## 2020-12-18 DIAGNOSIS — Z170 Estrogen receptor positive status [ER+]: Secondary | ICD-10-CM | POA: Diagnosis not present

## 2020-12-18 DIAGNOSIS — Z79899 Other long term (current) drug therapy: Secondary | ICD-10-CM | POA: Insufficient documentation

## 2020-12-18 DIAGNOSIS — C50411 Malignant neoplasm of upper-outer quadrant of right female breast: Secondary | ICD-10-CM | POA: Diagnosis not present

## 2020-12-18 DIAGNOSIS — M8580 Other specified disorders of bone density and structure, unspecified site: Secondary | ICD-10-CM | POA: Diagnosis not present

## 2020-12-18 NOTE — Telephone Encounter (Signed)
Rx ok;d for clonazepam #30 with one refill.  

## 2020-12-18 NOTE — Progress Notes (Signed)
Location of Breast Cancer: upper outer quadrant of right breast  Histology per Pathology Report:  A. BREAST, RIGHT AT 11:00, 5 CM FROM THE NIPPLE; ULTRASOUND-GUIDED CORE  NEEDLE BIOPSY:  - INVASIVE MAMMARY CARCINOMA, NO SPECIAL TYPE.  Receptor Status:  Estrogen Receptor (ER) Status: POSITIVE          Percentage of cells with nuclear positivity: >90%          Average intensity of staining: Strong   Progesterone Receptor (PgR) Status: POSITIVE          Percentage of cells with nuclear positivity: >90%          Average intensity of staining: Strong   HER2 (by immunohistochemistry): NEGATIVE (Score 1+)  Ki-67: Not performed   Did patient present with symptoms (if so, please note symptoms) or was this found on screening mammography?: She underwent a screening mammogram that shows B density breast. She had a mass on the right breast in the upper outer quadrant.  Past/Anticipated interventions by surgeon, if any: scheduled 12/27/2020 Rolm Bookbinder, MD    RIGHT BREAST LUMPECTOMY WITH RADIOACTIVE SEED AND AXILLARY SENTINEL LYMPH NODE BIOPSY    Past/Anticipated interventions by medical oncology, if any: Dr Burr Medico PLAN:  -proceed with lumpectomy on 12/27/20 -Oncotype on surgical sample  -I will order repeat DEXA to be done sometime after surgery -f/u after RT or sooner if needed   Lymphedema issues, if any:  no    Pain issues, if any:  no   SAFETY ISSUES: Prior radiation? no Pacemaker/ICD? no Possible current pregnancy? no, hysterectomy Is the patient on methotrexate? no  Current Complaints / other details:  none    Vitals:   12/23/20 1352  BP: 118/65  Pulse: 76  Resp: 20  Temp: 97.8 F (36.6 C)  SpO2: 96%  Weight: 150 lb (68 kg)  Height: 5' 2"  (1.575 m)

## 2020-12-18 NOTE — Progress Notes (Signed)
Jennifer [name]   Telephone:([phone]) [phone] Fax:([phone]) [phone]   Clinic New Consult Note   Patient Care Team: Jennifer Pheasant, MD as PCP - General (Internal Medicine)  Date of Service:  12/18/2020   CHIEF COMPLAINTS/PURPOSE OF CONSULTATION:  Right Breast Cancer, ER+  REFERRING PHYSICIAN:  Dr. Donne [name]   ASSESSMENT & PLAN:  Jennifer [name] is a 75 y.o. postmenopausal female with a history of HTN, depression   1. Malignant neoplasm of upper-outer quadrant of right breast, Stage IA, c(T1c, N0), ER+/PR+/HER2-, Grade 2  -found on screening mammogram. Right diagnostic MM and [place] on 11/04/20 showed 1.2 cm mass at 11 o'clock. Biopsy on 11/14/20 confirmed invasive mammary carcinoma. --We discussed her imaging findings and the biopsy results in great details. -She is scheduled for right lumpectomy on 12/27/20 with Dr. Donne [name]. -I recommend a Oncotype Dx test on the surgical sample and we'll make a decision about adjuvant chemotherapy based on the Oncotype result. Written material of this test was given to her. She is 75 but overall in good health, would be a candidate for chemotherapy if her Oncotype recurrence score is high. -Giving the strong ER and PR expression in her postmenopausal status, I recommend adjuvant endocrine therapy with aromatase inhibitor for a total of 5-10 years to reduce the risk of cancer recurrence. Potential benefits and side effects were discussed with patient and she is interested. -She will be seen by radiation oncologist Dr. Sondra [name] on 12/23/20. She will likely benefit from breast radiation if she undergo lumpectomy to decrease the risk of breast cancer.  -We also discussed the breast cancer surveillance after her surgery. She will continue annual screening mammogram, self exam, and a routine office visit with lab and exam with [place]. -I encouraged her to have healthy diet and exercise regularly.   2. Osteopenia -Her most recent DEXA was 08/17/16 showing  osteopenia (T-score -1.1). I recommend obtaining repeat.  3. HTN, depression, arthritis  -continue medication   PLAN:  -proceed with lumpectomy on 12/27/20 -Oncotype on surgical sample  -I will order repeat DEXA to be done sometime after surgery -f/u after RT or sooner if needed    Oncology History Overview Note   Cancer Staging  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female, estrogen receptor positive ([place]) Staging form: Breast, AJCC 8th Edition - Clinical stage from 11/14/2020: Stage IA (cT1c, cN0, cM0, G2, ER+, PR+, HER2-) - Signed by Jennifer Merle, MD on 12/17/2020    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female, estrogen receptor positive ([place])  11/04/2020 Mammogram   EXAM: DIGITAL DIAGNOSTIC UNILATERAL RIGHT MAMMOGRAM WITH TOMOSYNTHESIS AND CAD; ULTRASOUND RIGHT BREAST LIMITED  FINDINGS: Targeted ultrasound is performed, showing irregular hypoechoic mass at the right breast 11 o'clock 5 cm from nipple measuring 1 x 0.5 x 1.2 cm correlating to the mammographic mass. Ultrasound of the right axilla is negative.   11/14/2020 Cancer Staging   Staging form: Breast, AJCC 8th Edition - Clinical stage from 11/14/2020: Stage IA (cT1c, cN0, cM0, G2, ER+, PR+, HER2-) - Signed by Jennifer Merle, MD on 12/17/2020 Stage prefix: Initial diagnosis Histologic grading system: 3 grade system    11/14/2020 Pathology Results   DIAGNOSIS:  A. BREAST, RIGHT AT 11:00, 5 CM FROM THE NIPPLE; ULTRASOUND-GUIDED CORE NEEDLE BIOPSY:  - INVASIVE MAMMARY CARCINOMA, NO SPECIAL TYPE.  Histologic grade of invasive carcinoma: Grade 2    ADDENDUM:  CASE SUMMARY: BREAST BIOMARKER TESTS  Estrogen Receptor (ER) Status: POSITIVE  Percentage of cells with nuclear positivity: >90%          Average intensity of staining: Strong   Progesterone Receptor (PgR) Status: POSITIVE          Percentage of cells with nuclear positivity: >90%          Average intensity of  staining: Strong   HER2 (by immunohistochemistry): NEGATIVE (Score 1+)  Ki-67: Not performed    12/17/2020 Initial Diagnosis   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female, estrogen receptor positive ([place])      HISTORY OF PRESENTING ILLNESS:  Jennifer [name] 75 y.o. female is a here because of breast cancer. The patient was referred by Dr. Donne [name]. The patient presents to the clinic today accompanied by her husband Jennifer [name].   She had routine screening mammography on 10/21/20 showing a possible abnormality in the right breast. She underwent right diagnostic mammography and right breast ultrasonography on 11/04/20 showing: 1.2 cm irregular mass at 11 o'clock; right axilla negative.  Biopsy on 11/14/20 showed: invasive mammary carcinoma, grade 2. Prognostic indicators significant for: estrogen receptor, >90% positive and progesterone receptor, >90% positive. Proliferation marker Ki67 not performed. HER2 negative.   Today the patient notes they felt/feeling prior/after... -she did not feel the mass herself, reports she checks herself often -she reports arthritis that "comes and goes," does not require medication or ambulatory assistance   She has a PMHx of.... -asthma, COPD; uses oxygen at night, has inhaler for emergencies -scoliosis -depression/anxiety, on medication, followed by counselor -s/p hysterectomy in her early 73's, for uterine prolapse (ovaries in place)   Socially... -married with two children, a daughter (adopted) and a son -she notes cancer in her biological father, but unsure what kind -she has never smoked, does not drink alcohol   GYN HISTORY  Menarchal: 48-27 years old LMP: early 64's, with hysterectomy Contraceptive: HRT:  GP: 1   REVIEW OF SYSTEMS:   Constitutional: Denies fevers, chills or abnormal night sweats Eyes: Denies blurriness of vision, double vision or watery eyes Ears, nose, mouth, throat, and face: Denies mucositis or  sore throat Respiratory: Denies cough, dyspnea or wheezes Cardiovascular: Denies palpitation, chest discomfort or lower extremity swelling Gastrointestinal:  Denies nausea, heartburn or change in bowel habits Skin: Denies abnormal skin rashes Lymphatics: Denies new lymphadenopathy or easy bruising Neurological:Denies numbness, tingling or new weaknesses Behavioral/Psych: Mood is stable, no new changes  All other systems were reviewed with the patient and are negative.   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   Gastritis    GERD (gastroesophageal reflux disease)    Hypertension    PONV (postoperative nausea and vomiting)    Scoliosis    Ulcer     SURGICAL HISTORY: Past Surgical History:  Procedure Laterality Date   ABDOMINAL HYSTERECTOMY     ANTERIOR CERVICAL DECOMP/DISCECTOMY FUSION N/A 04/19/2017   Procedure: Anterior Cervical Decompression Fusion Cervical Three-Four, Cervical Four-Five, Cervical Five-Six;  Surgeon: Kary Kos, MD;  Location: [facility];  Service: Neurosurgery;  Laterality: N/A;  Anterior Cervical Decompression Fusion Cervical Three-Four, Cervical Four-Five, Cervical Five-Six   BREAST BIOPSY Right 11/14/2020   [place] Bx, [place] Cl[place], Path Pending   CHOLECYSTECTOMY  1996  DILATION AND CURETTAGE OF UTERUS  1971   LAPAROSCOPIC REMOVAL OF MESENTERIC MASS     LUMBAR [place] SURGERY  1998   [facility]   PARTIAL HYSTERECTOMY  1981   prolapsed uterus   TUBAL LIGATION  1978    SOCIAL HISTORY: Social History   Socioeconomic History   Marital status: Married    Spouse name: Not on file   Number of children: 2   Years of education: Not on file   Highest education level: Not on file  Occupational History   Occupation: retired Pharmacist, hospital  Tobacco Use   Smoking status:  Never   Smokeless tobacco: Never  Vaping Use   Vaping Use: Never used  Substance and Sexual Activity   Alcohol use: No    Alcohol/week: 0.0 standard drinks   Drug use: No   Sexual activity: Not on file  Other Topics Concern   Not on file  Social History Narrative   Not on file   Social Determinants of Health   Financial Resource Strain: Low Risk    Difficulty of Paying Living Expenses: Not hard at all  Food Insecurity: No Food Insecurity   Worried About Charity fundraiser in the Last Year: Never true   [place] in the Last Year: Never true  Transportation Needs: No Transportation Needs   Lack of Transportation (Medical): No   Lack of Transportation (Non-Medical): No  Physical Activity: Unknown   Days of Exercise per Week: 0 days   Minutes of Exercise per Session: Not on file  Stress: No Stress Concern Present   Feeling of Stress : Not at all  Social Connections: Unknown   Frequency of Communication with Friends and Family: More than three times a week   Frequency of Social Gatherings with Friends and Family: Not on file   Attends Religious Services: Not on Electrical engineer or Organizations: Not on file   Attends Archivist Meetings: Not on file   Marital Status: Married  Human resources officer Violence: Not At Risk   Fear of Current or Ex-Partner: No   Emotionally Abused: No   Physically Abused: No   Sexually Abused: No    FAMILY HISTORY: Family History  Problem Relation Age of Onset   Stroke Mother    Cancer Father        unknown type   CVA Maternal Grandmother    Breast cancer Neg Hx    Colon cancer Neg Hx     ALLERGIES:  is allergic to advair diskus [fluticasone-salmeterol] and codeine.  MEDICATIONS:  Current Outpatient Medications  Medication Sig Dispense Refill   citalopram (CELEXA) 40 MG tablet Take 1 tablet (40 mg total) by mouth daily. 90 tablet 0   clonazePAM (KLONOPIN) 0.5 MG tablet TAKE 1/2 TO 1 TABLET DAILY AS NEEDED. 30  tablet 1   Cyanocobalamin (B-12) 5000 MCG CAPS Take 5,000 Units by mouth daily. Metabolism support     gabapentin (NEURONTIN) 300 MG capsule Take 300 mg by mouth daily.     hydrochlorothiazide (HYDRODIURIL) 25 MG tablet Take 1 tablet (25 mg total) by mouth daily. 90 tablet 0   Multiple Vitamin (MULTIVITAMIN WITH MINERALS) TABS tablet Take 1 tablet by mouth daily.     OXYGEN Place 2 L into the nose at bedtime. nasal cannula     oxymetazoline (AFRIN) 0.05 % nasal spray Place 1 spray into both nostrils daily. Congestion     pravastatin (PRAVACHOL) 20 MG tablet Take 1  tablet (20 mg total) by mouth daily. 90 tablet 0   SPIRIVA HANDIHALER 18 MCG inhalation capsule Place 1 capsule (18 mcg total) into inhaler and inhale daily. (Patient not taking: Reported on 10/31/2020) 30 capsule 0   Tiotropium Bromide-Olodaterol (STIOLTO RESPIMAT) 2.5-2.5 MCG/ACT AERS Inhale 1 puff into the lungs daily.     No current facility-administered medications for this visit.    PHYSICAL EXAMINATION: ECOG PERFORMANCE STATUS: 0 - Asymptomatic  Vitals:   12/18/20 1005  BP: 140/63  Pulse: 76  Resp: 19  Temp: 97.6 F (36.4 C)  SpO2: 97%   Filed Weights   12/18/20 1005  Weight: 150 lb 1.6 oz (68.1 kg)    GENERAL:alert, no distress and comfortable SKIN: skin color, texture, turgor are normal, no rashes or significant lesions EYES: normal, Conjunctiva are pink and non-injected, sclera clear  NECK: supple, thyroid normal size, non-tender, without nodularity LYMPH:  no palpable lymphadenopathy in the cervical, axillary  LUNGS: clear to auscultation and percussion with normal breathing effort HEART: regular rate & rhythm and no murmurs and no lower extremity edema ABDOMEN:abdomen soft, non-tender and normal bowel sounds Musculoskeletal:no cyanosis of digits and no clubbing  NEURO: alert & oriented x 3 with fluent speech, no focal motor/sensory deficits BREAST: No palpable mass, nodules or adenopathy bilaterally.  Breast exam benign.  LABORATORY DATA:  I have reviewed the data as listed CBC Latest Ref Rng & Units 08/13/2020 03/02/2019 03/15/2018  WBC 4.0 - 10.5 K/uL 8.3 6.9 10.3  Hemoglobin 12.0 - 15.0 g/dL 14.3 14.1 14.7  Hematocrit 36.0 - 46.0 % 42.9 41.9 43.6  Platelets 150.0 - 400.0 K/uL 263.0 211.0 250.0    CMP Latest Ref Rng & Units 08/13/2020 12/19/2019 08/14/2019  Glucose 70 - 99 mg/dL 92 93 96  BUN 6 - 23 mg/dL 8 8 11   Creatinine 0.40 - 1.20 mg/dL 0.57 0.60 0.64  Sodium 135 - 145 mEq/L 139 142 140  Potassium 3.5 - 5.1 mEq/L 3.7 3.4(L) 3.9  Chloride 96 - 112 mEq/L 97 99 101  CO2 19 - 32 mEq/L 35(H) 39(H) 34(H)  Calcium 8.4 - 10.5 mg/dL 9.4 9.4 9.5  Total Protein 6.0 - 8.3 g/dL 6.5 6.2 6.3  Total Bilirubin 0.2 - 1.2 mg/dL 0.6 0.7 0.5  Alkaline Phos 39 - 117 U/L 56 55 52  AST 0 - 37 U/L 23 20 18   ALT 0 - 35 U/L 14 15 12      RADIOGRAPHIC STUDIES: I have personally reviewed the radiological images as listed and agreed with the findings in the report. No results found.   No orders of the defined types were placed in this encounter.   All questions were answered. The patient knows to call the clinic with any problems, questions or concerns. The total time spent in the appointment was 45 minutes.     Jennifer Merle, MD 12/18/2020 8:58 PM  I, Wilburn Mylar, am acting as scribe for Jennifer Merle, MD.   I have reviewed the above documentation for accuracy and completeness, and I agree with the above.

## 2020-12-18 NOTE — Progress Notes (Signed)
Surgical Instructions    Your procedure is scheduled on Friday December 9th.  Report to [facility] Main Entrance "A" at 5:30 A.M., then check in with the Admitting office.  Call this number if you have problems the morning of surgery:  [phone]   If you have any questions prior to your surgery date call [phone]: Open Monday-Friday 8am-4pm    Remember:  Do not eat after midnight the night before your surgery  You may drink clear liquids until 5:30 the morning of your surgery.   Clear liquids allowed are: Water, Non-Citrus Juices (without pulp), Carbonated Beverages, Clear Tea, Black Coffee ONLY (NO MILK, CREAM OR POWDERED CREAMER of any kind), and Gatorade    Take these medicines the morning of surgery with A SIP OF WATER citalopram (CELEXA) 40 MG tablet gabapentin (NEURONTIN) 300 MG capsule oxymetazoline (AFRIN) 0.05 % nasal spray pravastatin (PRAVACHOL) 20 MG tablet Tiotropium Bromide-Olodaterol (STIOLTO RESPIMAT) 2.5-2.5 MCG/ACT AERS (May bring with you to the hospital)  IF NEEDED  clonazePAM (KLONOPIN) 0.5 MG tablet  As of today, STOP taking any Aspirin (unless otherwise instructed by your surgeon) Aleve, Naproxen, Ibuprofen, Motrin, Advil, Goody's, BC's, all herbal medications, fish oil, and all vitamins.     After your COVID test   You are not required to quarantine however you are required to wear a well-fitting mask when you are out and around people not in your household.  If your mask becomes wet or soiled, replace with a new one.  Wash your hands often with soap and water for 20 seconds or clean your hands with an alcohol-based hand sanitizer that contains at least 60% alcohol.  Do not share personal items.  Notify your provider: if you are in close contact with someone who has COVID  or if you develop a fever of 100.4 or greater, sneezing, cough, sore throat, shortness of breath or body aches.             Do not wear jewelry or makeup Do not wear  lotions, powders, perfumes, or deodorant. Do not shave 48 hours prior to surgery.   Do not bring valuables to the hospital. DO Not wear nail polish, gel polish, artificial nails, or any other type of covering on natural nails including finger and toenails. If patients have artificial nails, gel coating, etc. that need to be removed by a nail salon, please have this removed prior to surgery or surgery may need to be canceled/delayed if the surgeon/ anesthesia feels like the patient is unable to be adequately monitored.             [facility] is not responsible for any belongings or valuables.  Do NOT Smoke (Tobacco/Vaping)  24 hours prior to your procedure  If you use a CPAP at night, you may bring your mask for your overnight stay.   Contacts, glasses, hearing aids, dentures or partials may not be worn into surgery, please bring cases for these belongings   For patients admitted to the hospital, discharge time will be determined by your treatment team.   Patients discharged the day of surgery will not be allowed to drive home, and someone needs to stay with them for 24 hours.  NO VISITORS WILL BE ALLOWED IN PRE-OP WHERE PATIENTS ARE PREPPED FOR SURGERY.  ONLY 1 SUPPORT PERSON MAY BE PRESENT IN THE WAITING ROOM WHILE YOU ARE IN SURGERY.  IF YOU ARE TO BE ADMITTED, ONCE YOU ARE IN YOUR ROOM YOU WILL BE ALLOWED TWO (2)  VISITORS. 1 (ONE) VISITOR MAY STAY OVERNIGHT BUT MUST ARRIVE TO THE ROOM BY 8pm.  Minor children may have two parents present. Special consideration for safety and communication needs will be reviewed on a case by case basis.  Special instructions:    Oral Hygiene is also important to reduce your risk of infection.  Remember - BRUSH YOUR TEETH THE MORNING OF SURGERY WITH YOUR REGULAR TOOTHPASTE   [facility]- Preparing For Surgery  Before surgery, you can play an important role. Because skin is not sterile, your skin needs to be as free of germs as possible. You can reduce the  number of germs on your skin by washing with CHG (chlorahexidine gluconate) Soap before surgery.  CHG is an antiseptic cleaner which kills germs and bonds with the skin to continue killing germs even after washing.     Please do not use if you have an allergy to CHG or antibacterial soaps. If your skin becomes reddened/irritated stop using the CHG.  Do not shave (including legs and underarms) for at least 48 hours prior to first CHG shower. It is OK to shave your face.  Please follow these instructions carefully.     Shower the NIGHT BEFORE SURGERY and the MORNING OF SURGERY with CHG Soap.   If you chose to wash your hair, wash your hair first as usual with your normal shampoo. After you shampoo, rinse your hair and body thoroughly to remove the shampoo.  Then [employer] and genitals (private parts) with your normal soap and rinse thoroughly to remove soap.  After that Use CHG Soap as you would any other liquid soap. You can apply CHG directly to the skin and wash gently with a scrungie or a clean washcloth.   Apply the CHG Soap to your body ONLY FROM THE NECK DOWN.  Do not use on open wounds or open sores. Avoid contact with your eyes, ears, mouth and genitals (private parts). Wash Face and genitals (private parts)  with your normal soap.   Wash thoroughly, paying special attention to the area where your surgery will be performed.  Thoroughly rinse your body with warm water from the neck down.  DO NOT shower/wash with your normal soap after using and rinsing off the CHG Soap.  Pat yourself dry with a CLEAN TOWEL.  Wear CLEAN PAJAMAS to bed the night before surgery  Place CLEAN SHEETS on your bed the night before your surgery  DO NOT SLEEP WITH PETS.   Day of Surgery:  Take a shower with CHG soap. Wear Clean/Comfortable clothing the morning of surgery Do not apply any deodorants/lotions.   Remember to brush your teeth WITH YOUR REGULAR TOOTHPASTE.   Please read over the following  fact sheets that you were given.

## 2020-12-18 NOTE — Telephone Encounter (Signed)
RX RefillBobbye [name] Last Seen: 08-15-20 Last Ordered: 08-30-20 Next Appt: 12-27-20

## 2020-12-19 ENCOUNTER — Other Ambulatory Visit: Payer: Self-pay

## 2020-12-19 ENCOUNTER — Encounter: Payer: Self-pay | Admitting: *Deleted

## 2020-12-19 ENCOUNTER — Encounter (HOSPITAL_COMMUNITY)
Admission: RE | Admit: 2020-12-19 | Discharge: 2020-12-19 | Disposition: A | Discharge status: Home / Self Care | Payer: Medicare Other | Source: Ambulatory Visit | Attending: General Surgery | Admitting: General Surgery

## 2020-12-19 ENCOUNTER — Encounter (HOSPITAL_COMMUNITY): Payer: Self-pay | Admitting: General Surgery

## 2020-12-19 VITALS — BP 118/58 | HR 75 | Temp 97.9°F | Resp 18 | Ht 61.0 in | Wt 149.9 lb

## 2020-12-19 DIAGNOSIS — Z01818 Encounter for other preprocedural examination: Secondary | ICD-10-CM | POA: Diagnosis not present

## 2020-12-19 DIAGNOSIS — I10 Essential (primary) hypertension: Secondary | ICD-10-CM | POA: Diagnosis not present

## 2020-12-19 LAB — BASIC METABOLIC PANEL
Anion gap: 7 (ref 5–15)
BUN: 8 mg/dL (ref 8–23)
CO2: 35 mmol/L — ABNORMAL HIGH (ref 22–32)
Calcium: 9.5 mg/dL (ref 8.9–10.3)
Chloride: 97 mmol/L — ABNORMAL LOW (ref 98–111)
Creatinine, Ser: 0.6 mg/dL (ref 0.44–1.00)
GFR, Estimated: >60 mL/min (ref >60)
Glucose, Bld: 98 mg/dL (ref 70–99)
Potassium: 3.4 mmol/L — ABNORMAL LOW (ref 3.5–5.1)
Sodium: 139 mmol/L (ref 135–145)

## 2020-12-19 LAB — CBC
HCT: 44.2 % (ref 36.0–46.0)
Hemoglobin: 14.2 g/dL (ref 12.0–15.0)
MCH: 31.1 pg (ref 26.0–34.0)
MCHC: 32.1 g/dL (ref 30.0–36.0)
MCV: 96.7 fL (ref 80.0–100.0)
Platelets: 231 10*3/uL (ref 150–400)
RBC: 4.57 MIL/uL (ref 3.87–5.11)
RDW: 12.5 % (ref 11.5–15.5)
WBC: 10 10*3/uL (ref 4.0–10.5)
nRBC: 0 % (ref 0.0–0.2)

## 2020-12-19 NOTE — Progress Notes (Signed)
PCP - Dr. [name] Cardiologist - Denies  Chest x-ray - Not indicated EKG - 12/19/20 Stress Test - Yes a long time ago no f/u needed ECHO - 03/05/13 Cardiac Cath - Denies  Sleep Study - Denies Uses O2 at night (COPD and asthma)  DM - Denies  ERAS Protcol - Yes PRE-SURGERY Ensure given  COVID TEST- Not indicated   Anesthesia review: No  Patient denies shortness of breath, fever, cough and chest pain at PAT appointment   All instructions explained to the patient, with a verbal understanding of the material. Patient agrees to go over the instructions while at home for a better understanding.  The opportunity to ask questions was provided.

## 2020-12-22 NOTE — Progress Notes (Signed)
Radiation Oncology         [phone] ________________________________  Initial Outpatient Consultation  Name: Jennifer [name] MRN: [id number]  Date: 12/23/2020  DOB: 07/01/1945  JT:[id number], Randell Patient, MD  Rolm Bookbinder, MD   REFERRING PHYSICIAN: Rolm Bookbinder, MD  DIAGNOSIS: The encounter diagnosis was 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female, estrogen receptor positive ([place]).  Stage IA (cT1c, cN0, cM0) Right Breast UOQ, Invasive Mammary Carcinoma, ER+ / PR+ / Her2-, Grade 2  HISTORY OF PRESENT ILLNESS::Jennifer [name] is a 75 y.o. female who is accompanied by no one. she is seen as a courtesy of Dr. Donne Hazel for an opinion concerning radiation therapy as part of management for her recently diagnosed right breast cancer.   The patient underwent a bilateral screening mammogram on 10/21/20 which revealed a possible mass in the right breast. Diagnostic right breast mammogram with tomosynthesis and cad, and right breast ultrasound on 11/04/20 further revealed a suspicious irregular hypoechoic mass at the right breast 11 o'clock position, 5 cm from nipple, measuring 1 x 0.5 x 1.2 cm, correlating to the mammographic mass. (Axillary ultrasound showed negative findings).   Right breast biopsy at the 11 o'clock position, 5cmfn, on 11/14/20 revealed invasive mammary carcinoma measuring 10 mm. Prognotic indicators significant for: estrogen receptor >90% positive, progesterone receptor >90% positive, both with strong staining intensity; Her2 status negative; proliferation marker Ki67 not performed; Grade 2.  Subsequently, the patient was referred to Dr. Donne Hazel on 12/06/20 to discuss treatment options. Following discussion of the risks and benefits, the patient agreed to proceed with right breast lumpectomy and SNL biopsies. Patient is scheduled for procedure on 12/27/20 under the care of Dr. Donne Hazel.   Dr. Donne Hazel then referred the patient to Dr. Burr Medico on 12/18/20  for discussion of further treatment options. Dr. Burr Medico recommended Oncotype testing collected from future lumpectomy to make a decision about adjuvant chemotherapy (based on Oncotype Dx results). Additionally, Dr. Burr Medico recommended adjuvant endocrine therapy with aromatase inhibitor for a total of 5-10 years to reduce the patient's risk of cancer recurrence.   Of note: s/p hysterectomy in her early 9's, for uterine prolapse (ovaries in place)  She denies any family history of breast or ovarian cancer.   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   Gastritis    GERD (gastroesophageal reflux disease)    Hypertension    PONV (postoperative nausea and vomiting)    Scoliosis    Ulcer     PAST SURGICAL HISTORY: Past Surgical History:  Procedure Laterality Date   ABDOMINAL HYSTERECTOMY     ANTERIOR CERVICAL DECOMP/DISCECTOMY FUSION N/A 04/19/2017   Procedure: Anterior Cervical Decompression Fusion Cervical Three-Four, Cervical Four-Five, Cervical Five-Six;  Surgeon: Kary Kos, MD;  Location: [facility];  Service: Neurosurgery;  Laterality: N/A;  Anterior Cervical Decompression Fusion Cervical Three-Four, Cervical Four-Five, Cervical Five-Six   BREAST BIOPSY Right 11/14/2020   [place] Bx, Venus Clip, Path Pending   CHOLECYSTECTOMY  1996   DILATION AND CURETTAGE OF UTERUS  1971   LAPAROSCOPIC REMOVAL OF MESENTERIC MASS     LUMBAR [place]   [facility]   PARTIAL HYSTERECTOMY  1981  prolapsed uterus   TUBAL LIGATION  1978    FAMILY HISTORY:  Family History  Problem Relation Age of Onset   Stroke Mother    Cancer Father        unknown type   CVA Maternal Grandmother    Breast cancer Neg Hx    Colon cancer Neg Hx     SOCIAL HISTORY:  Social  History   Tobacco Use   Smoking status: Never   Smokeless tobacco: Never  Vaping Use   Vaping Use: Never used  Substance Use Topics   Alcohol use: No    Alcohol/week: 0.0 standard drinks   Drug use: No    ALLERGIES:  Allergies  Allergen Reactions   Advair Diskus [Fluticasone-Salmeterol] Other (See Comments)    Causes blisters in her mouth   Codeine     Unknown reaction    MEDICATIONS:  Current Outpatient Medications  Medication Sig Dispense Refill   citalopram (CELEXA) 40 MG tablet Take 1 tablet (40 mg total) by mouth daily. 90 tablet 0   clonazePAM (KLONOPIN) 0.5 MG tablet TAKE 1/2 TO 1 TABLET DAILY AS NEEDED. 30 tablet 1   Cyanocobalamin (B-12) 5000 MCG CAPS Take 5,000 Units by mouth daily. Metabolism support     gabapentin (NEURONTIN) 300 MG capsule Take 300 mg by mouth daily.     hydrochlorothiazide (HYDRODIURIL) 25 MG tablet Take 1 tablet (25 mg total) by mouth daily. 90 tablet 0   Multiple Vitamin (MULTIVITAMIN WITH MINERALS) TABS tablet Take 1 tablet by mouth daily.     OXYGEN Place 2 L into the nose at bedtime. nasal cannula     oxymetazoline (AFRIN) 0.05 % nasal spray Place 1 spray into both nostrils daily. Congestion     pravastatin (PRAVACHOL) 20 MG tablet Take 1 tablet (20 mg total) by mouth daily. 90 tablet 0   Tiotropium Bromide-Olodaterol (STIOLTO RESPIMAT) 2.5-2.5 MCG/ACT AERS Inhale 1 puff into the lungs daily.     SPIRIVA HANDIHALER 18 MCG inhalation capsule Place 1 capsule (18 mcg total) into inhaler and inhale daily. (Patient not taking: Reported on 10/31/2020) 30 capsule 0   No current facility-administered medications for this encounter.    REVIEW OF SYSTEMS:  A 10+ POINT REVIEW OF SYSTEMS WAS OBTAINED including neurology, dermatology, psychiatry, cardiac, respiratory, lymph, extremities, GI, GU, musculoskeletal, constitutional, reproductive, HEENT.  Prior to biopsy she denied any pain within the breast area nipple discharge or bleeding   PHYSICAL EXAM:   height is 5' 2"  (1.575 m) and weight is 150 lb (68 kg). Her temperature is 97.8 F (36.6 C). Her blood pressure is 118/65 and her pulse is 76. Her respiration is 20 and oxygen saturation is 96%.   General: Alert and oriented, in no acute distress HEENT: Head is normocephalic. Extraocular movements are intact.  Neck: Neck is supple, no palpable cervical or supraclavicular lymphadenopathy. Heart: Regular in rate and rhythm with no murmurs, rubs, or gallops. Chest: Clear to auscultation bilaterally, with no rhonchi, wheezes, or rales. Abdomen: Soft, nontender, nondistended, with no rigidity or guarding. Extremities: No cyanosis or edema. Lymphatics: see Neck Exam Skin: No concerning lesions. Musculoskeletal: symmetric strength and muscle tone throughout. Neurologic: Cranial nerves II through XII are grossly intact. No obvious focalities. Speech is fluent. Coordination is intact. Psychiatric: Judgment and insight are intact. Affect is appropriate.  Left Breast: no palpable mass, nipple discharge or bleeding. Right breast: Small biopsy site in the upper outer quadrant of.  No palpable mass nipple discharge or bleeding.  ECOG = 0  0 - Asymptomatic (Fully active, able to carry on all predisease activities without restriction)  1 - Symptomatic but completely ambulatory (Restricted in physically strenuous activity but ambulatory and able to carry out work of a light or sedentary nature. For example, light housework, office work)  2 - Symptomatic, <50% in bed during the day (Ambulatory and capable of all self care but unable to carry out any work activities. Up and about more than 50% of waking hours)  3 - Symptomatic, >50% in bed, but not bedbound (Capable of only limited self-care, confined to bed or chair 50% or more of waking hours)  4 - Bedbound (Completely disabled. Cannot carry on any self-care. Totally confined to bed or chair)  5 - Death   Eustace Pen MM, Creech RH, Tormey DC, et al.  [phone]). "Toxicity and response criteria of the [facility]". [place] Oncol. 5 (6): 649-55  LABORATORY DATA:  Lab Results  Component Value Date   WBC 10.0 12/19/2020   HGB 14.2 12/19/2020   HCT 44.2 12/19/2020   MCV 96.7 12/19/2020   PLT 231 12/19/2020   NEUTROABS 5.1 08/13/2020   Lab Results  Component Value Date   NA 139 12/19/2020   K 3.4 (L) 12/19/2020   CL 97 (L) 12/19/2020   CO2 35 (H) 12/19/2020   GLUCOSE 98 12/19/2020   CREATININE 0.60 12/19/2020   CALCIUM 9.5 12/19/2020      RADIOGRAPHY: No results found.    IMPRESSION: Stage IA (cT1c, cN0, cM0) Right Breast UOQ, Invasive Mammary Carcinoma, ER+ / PR+ / Her2-, Grade 2  Patient would be a good candidate for radiation therapy as breast conserving therapy.  Depending on final pathologic findings the patient may potentially be able to avoid radiation therapy if she agrees to adjuvant hormonal therapy.  At this point the patient appears to be wanting to proceed with both radiation therapy as well as adjuvant hormonal therapy to reduce her chances of recurrence.  She would appear to be a good candidate for hypofractionated accelerated radiation therapy assuming her lymph nodes are negative on sentinel node procedure.  Today, I talked to the patient  about the findings and work-up thus far.  We discussed the natural history of invasive mammary carcinoma and general treatment, highlighting the role of radiotherapy in the management.  We discussed the available radiation techniques, and focused on the details of logistics and delivery.  We reviewed the anticipated acute and late sequelae associated with radiation in this setting.  The patient was encouraged to ask questions that I answered to the best of my ability.  PLAN: The patient will proceed with surgery as above.  She will be seen approximately 3 to 4 weeks out for further discussion of radiation therapy.   60 minutes of total time was spent for  this patient encounter, including preparation, face-to-face counseling with the patient and coordination of care, physical exam, and documentation of the encounter.   ------------------------------------------------  Blair Promise, PhD, MD  This document serves as a record of services personally performed by Gery Pray, MD. It was created on his behalf by Roney Mans, a trained medical scribe. The creation of this record is based on the scribe's personal observations and the provider's statements to them. This document has been checked and approved by the attending provider.

## 2020-12-23 ENCOUNTER — Encounter: Payer: Self-pay | Admitting: *Deleted

## 2020-12-23 ENCOUNTER — Telehealth: Payer: Self-pay | Admitting: *Deleted

## 2020-12-23 ENCOUNTER — Ambulatory Visit
Admission: RE | Admit: 2020-12-23 | Discharge: 2020-12-23 | Disposition: A | Discharge status: Home / Self Care | Payer: Medicare Other | Source: Ambulatory Visit | Attending: Radiation Oncology | Admitting: Radiation Oncology

## 2020-12-23 ENCOUNTER — Encounter: Payer: Self-pay | Admitting: Radiation Oncology

## 2020-12-23 ENCOUNTER — Other Ambulatory Visit: Payer: Self-pay

## 2020-12-23 VITALS — BP 118/65 | HR 76 | Temp 97.8°F | Resp 20 | Ht 62.0 in | Wt 150.0 lb

## 2020-12-23 DIAGNOSIS — F329 Major depressive disorder, single episode, unspecified: Secondary | ICD-10-CM | POA: Diagnosis not present

## 2020-12-23 DIAGNOSIS — K2970 Gastritis, unspecified, without bleeding: Secondary | ICD-10-CM | POA: Insufficient documentation

## 2020-12-23 DIAGNOSIS — Z170 Estrogen receptor positive status [ER+]: Secondary | ICD-10-CM

## 2020-12-23 DIAGNOSIS — C50411 Malignant neoplasm of upper-outer quadrant of right female breast: Secondary | ICD-10-CM | POA: Insufficient documentation

## 2020-12-23 DIAGNOSIS — Z923 Personal history of irradiation: Secondary | ICD-10-CM | POA: Diagnosis not present

## 2020-12-23 DIAGNOSIS — I10 Essential (primary) hypertension: Secondary | ICD-10-CM | POA: Insufficient documentation

## 2020-12-23 DIAGNOSIS — Z8673 Personal history of transient ischemic attack (TIA), and cerebral infarction without residual deficits: Secondary | ICD-10-CM | POA: Diagnosis not present

## 2020-12-23 DIAGNOSIS — J449 Chronic obstructive pulmonary disease, unspecified: Secondary | ICD-10-CM | POA: Insufficient documentation

## 2020-12-23 DIAGNOSIS — K219 Gastro-esophageal reflux disease without esophagitis: Secondary | ICD-10-CM | POA: Diagnosis not present

## 2020-12-23 NOTE — Progress Notes (Signed)
See MD note for nursing evaluation. °

## 2020-12-23 NOTE — Telephone Encounter (Signed)
Spoke to pt, provided navigation resources and contact information. Denies questions or concerns regarding dx or treatment care plan. Encourage pt to call with needs. Received verbal understanding.

## 2020-12-25 ENCOUNTER — Ambulatory Visit: Payer: Medicare Other | Attending: General Surgery | Admitting: Rehabilitation

## 2020-12-25 ENCOUNTER — Other Ambulatory Visit: Payer: Self-pay

## 2020-12-25 ENCOUNTER — Encounter: Payer: Self-pay | Admitting: Rehabilitation

## 2020-12-25 ENCOUNTER — Telehealth: Payer: Self-pay | Admitting: Internal Medicine

## 2020-12-25 ENCOUNTER — Other Ambulatory Visit: Payer: Medicare Other

## 2020-12-25 DIAGNOSIS — M25611 Stiffness of right shoulder, not elsewhere classified: Secondary | ICD-10-CM | POA: Insufficient documentation

## 2020-12-25 NOTE — Patient Instructions (Signed)

## 2020-12-25 NOTE — Therapy (Signed)
[facility] @ [address] [place], [place], [address] Phone: [phone]   Fax:  [phone]  Physical Therapy Treatment  Patient Details  Name: Jennifer [name] MRN: [id number] Date of Birth: 1945/12/21 Referring Provider (PT): Dr Donne Hazel   Encounter Date: 12/25/2020   PT End of Session - 12/25/20 1449     Visit Number 1    Number of Visits 2    Date for PT Re-Evaluation 02/05/21    PT Start Time 1400    PT Stop Time 1440    PT Time Calculation (min) 40 min    Activity Tolerance Patient tolerated treatment well    Behavior During Therapy [facility] for tasks assessed/performed             Past Medical History:  Diagnosis Date   Anxiety    panic attacks   Arthritis    Asthma    Bronchitis    COPD (chronic obstructive pulmonary disease) ([place])    Depression    Dyspnea    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   Gastritis    GERD (gastroesophageal reflux disease)    Hypertension    PONV (postoperative nausea and vomiting)    Scoliosis    Ulcer     Past Surgical History:  Procedure Laterality Date   ABDOMINAL HYSTERECTOMY     ANTERIOR CERVICAL DECOMP/DISCECTOMY FUSION N/A 04/19/2017   Procedure: Anterior Cervical Decompression Fusion Cervical Three-Four, Cervical Four-Five, Cervical Five-Six;  Surgeon: Kary Kos, MD;  Location: [facility];  Service: Neurosurgery;  Laterality: N/A;  Anterior Cervical Decompression Fusion Cervical Three-Four, Cervical Four-Five, Cervical Five-Six   BREAST BIOPSY Right 11/14/2020   [place] Bx, Venus Clip, Path Pending   CHOLECYSTECTOMY  1996   DILATION AND CURETTAGE OF UTERUS  1971   LAPAROSCOPIC REMOVAL OF MESENTERIC MASS     LUMBAR [place]   PARTIAL HYSTERECTOMY  1981   prolapsed uterus   TUBAL LIGATION  1978    There were no vitals filed for this visit.   Subjective Assessment - 12/25/20 1406     Subjective I have a bad case of scoliosis so it makes my  shoulders bad.  I have had 2 neck surgeries    Pertinent History Rt lump with SLNB 12/27/20 with radiation most likely after surgery.  No chemotherapy needed. Hx: asthma, COPD uses O2 at night,    Limitations --   I can't reach up high   Patient Stated Goals get information from providers    Currently in Pain? No/denies                [facility] PT Assessment - 12/25/20 0001       Assessment   Medical Diagnosis Rt breast cancer    Referring Provider (PT) Dr Donne Hazel    Onset Date/Surgical Date 12/25/20    Hand Dominance Right    Prior Therapy no      Precautions   Precaution Comments active cancer      Balance Screen   Has the patient fallen in the past 6 months No    Has the patient had a decrease in activity level because of a fear of falling?  No    Is the patient reluctant to leave their home because of a fear of falling?  No      Home Ecologist residence    Living Arrangements Spouse/significant other  Available Help at Discharge Family    Type of [place] to enter      Prior Function   Level of [place] Retired    Leisure I have a TM but don't use it too much. I got it in the bedroom to start using it again.      Cognition   Overall Cognitive Status Within Functional Limits for tasks assessed      ROM / Strength   AROM / PROM / Strength AROM      AROM   AROM Assessment Site Shoulder    Right/Left Shoulder Right;Left    Right Shoulder Extension 52 Degrees    Right Shoulder Flexion 120 Degrees   with shoulder pain   Right Shoulder ABduction 135 Degrees    Right Shoulder External Rotation 60 Degrees               LYMPHEDEMA/ONCOLOGY QUESTIONNAIRE - 12/25/20 0001       Type   Cancer Type Rt breast      Lymphedema Assessments   Lymphedema Assessments Upper extremities      Right Upper Extremity Lymphedema   10 cm Proximal to Olecranon Process 30 cm    Olecranon  Process 25 cm    10 cm Proximal to Ulnar Styloid Process 23 cm    Just Proximal to Ulnar Styloid Process 17.7 cm    Across Hand at [employer] 18 cm    At [place] of 2nd Digit 5.9 cm    Other ring finger trigger finger      Left Upper Extremity Lymphedema   10 cm Proximal to Olecranon Process 29 cm    Olecranon Process 26 cm    10 cm Proximal to Ulnar Styloid Process 22.7 cm    Just Proximal to Ulnar Styloid Process 16.5 cm    Across Hand at [employer] 18 cm    At [place] of 2nd Digit 5.5 cm    Other pt notes Lt side has always been smaller due to scoliosis and development             L-DEX FLOWSHEETS - 12/25/20 1400       L-DEX LYMPHEDEMA SCREENING   Measurement Type Unilateral    L-DEX MEASUREMENT EXTREMITY Upper Extremity    POSITION  Standing    DOMINANT SIDE Right    At Risk Side Right    BASELINE SCORE (UNILATERAL) -7.4    Comment low quality plot line Rt LE noted on both subsequent attempts.  Pt has bad scoliosis that may affect stance                                PT Education - 12/25/20 1449     Education Details POC, briefly lymphedema, SOZO and surveillance, HEP to start    Person(s) Educated Patient    Methods Explanation;Demonstration;Handout    Comprehension Verbalized understanding                 PT Long Term Goals - 12/25/20 1453       PT LONG TERM GOAL #1   Title Pt will return to baseline AROM of the Lt shoulder    Baseline NA    Time 6    Period Weeks    Status New      PT LONG TERM GOAL #2   Title Pt  will be ind with final HEP    Time 6    Period Weeks    Status New      PT LONG TERM GOAL #3   Title NA      PT LONG TERM GOAL #4   Title NA             Breast Clinic Goals - 12/25/20 1453       Patient will be able to verbalize understanding of pertinent lymphedema risk reduction practices relevant to her diagnosis specifically related to skin care.   Status Achieved      Patient will be  able to return demonstrate and/or verbalize understanding of the post-op home exercise program related to regaining shoulder range of motion.   Status Achieved      Patient will be able to verbalize understanding of the importance of attending the postoperative After Breast Cancer Class for further lymphedema risk reduction education and therapeutic exercise.   Status Achieved                  Plan - 12/25/20 1450     Clinical Impression Statement Pt presents pre Rt lumpectomy and SLNB in 2 days.  Pt has limited Rt shoulder mobility due to scoliosis, hx of cervical decompression, and OA so pt will start exercises prior to surgery to see if this helps.  Reminded pt to limit TE if it aggravates joint pain.  Pt currently has limited UE mobility with reaching at home.  pt will return for post op assessment    Personal Factors and Comorbidities Age;Comorbidity 2    Comorbidities active cancer, scoliosis,    Examination-Activity Limitations Lift;Reach Overhead    Examination-Participation Restrictions Community Activity;Cleaning    Stability/Clinical Decision Making Stable/Uncomplicated    Clinical Decision Making Low    Rehab Potential Excellent    PT Frequency --   1 visit   PT Duration 6 weeks    PT Treatment/Interventions ADLs/Self Care Home Management;Therapeutic exercise;Patient/family education;Manual techniques    PT Next Visit Plan post op check, schedule SOZO and ABC class or talk in person regarding risk reduction if unable    Consulted and Agree with Plan of Care Patient             Patient will benefit from skilled therapeutic intervention in order to improve the following deficits and impairments:  Decreased range of motion  Visit Diagnosis: Stiffness of right shoulder, not elsewhere classified     Problem List Patient Active Problem List   Diagnosis Date Noted   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female, estrogen receptor positive  ([place]) 12/17/2020   Nasal congestion 02/04/2020   Sore throat 01/25/2020   Neck pain 08/19/2019   B12 deficiency 08/19/2019   Knee pain 02/04/2019   Memory change 02/04/2019   Radiculopathy of cervical spine 04/20/2017   Spondylosis of cervical spine 04/19/2017   Ear pain, left 03/27/2017   Right arm pain 11/12/2016   Vertigo 09/02/2016   Left low back pain 05/22/2015   Left hip pain 05/22/2015   SOB (shortness of breath) 11/11/2014   Right shoulder pain 07/16/2014   Health care maintenance 06/03/2014   Arm skin lesion, left 06/18/2013   URI (upper respiratory infection) 03/03/2013   Hyperglycemia 11/12/2012   Hypercholesterolemia 11/12/2012   Essential hypertension, benign 05/12/2012   GERD (gastroesophageal reflux disease) 05/12/2012   Gastritis 05/12/2012   History of colonic polyps 05/12/2012   Asthma 12/13/2011   Chronic restrictive  lung disease 05/11/2011    Stark Bray, PT 12/25/2020, 2:54 PM  [facility] @ [address] [place], [place], [address] Phone: [phone]   Fax:  [phone]  Name: Jennifer [name] MRN: [id number] Date of Birth: 1945-09-18

## 2020-12-26 ENCOUNTER — Ambulatory Visit
Admission: RE | Admit: 2020-12-26 | Discharge: 2020-12-26 | Disposition: A | Discharge status: Home / Self Care | Payer: Medicare Other | Source: Ambulatory Visit | Attending: General Surgery | Admitting: General Surgery

## 2020-12-26 DIAGNOSIS — C50411 Malignant neoplasm of upper-outer quadrant of right female breast: Secondary | ICD-10-CM

## 2020-12-26 DIAGNOSIS — C50911 Malignant neoplasm of unspecified site of right female breast: Secondary | ICD-10-CM | POA: Diagnosis not present

## 2020-12-26 NOTE — Anesthesia Preprocedure Evaluation (Addendum)
Anesthesia Evaluation  Patient identified by MRN, date of birth, ID band Patient awake    Reviewed: Allergy & Precautions, NPO status , Patient's Chart, lab work & pertinent test results  History of Anesthesia Complications (+) PONV and history of anesthetic complications  Airway Mallampati: II  TM Distance: >3 FB Neck ROM: Full    Dental no notable dental hx.    Pulmonary asthma , COPD,    Pulmonary exam normal breath sounds clear to auscultation       Cardiovascular hypertension, Normal cardiovascular exam Rhythm:Regular Rate:Normal     Neuro/Psych PSYCHIATRIC DISORDERS Anxiety Depression  Neuromuscular disease (cervical radiculopathy)    GI/Hepatic Neg liver ROS, GERD  ,  Endo/Other    Renal/GU negative Renal ROS  negative genitourinary   Musculoskeletal  (+) Arthritis , Osteoarthritis,    Abdominal   Peds negative pediatric ROS (+)  Hematology negative hematology ROS (+)   Anesthesia Other Findings   Reproductive/Obstetrics negative OB ROS                            Anesthesia Physical Anesthesia Plan  ASA: 3  Anesthesia Plan: General and Regional   Post-op Pain Management:    Induction: Intravenous  PONV Risk Score and Plan: 4 or greater and Treatment may vary due to age or medical condition, Dexamethasone and Ondansetron  Airway Management Planned: LMA  Additional Equipment:   Intra-op Plan:   Post-operative Plan: Extubation in OR  Informed Consent: I have reviewed the patients History and Physical, chart, labs and discussed the procedure including the risks, benefits and alternatives for the proposed anesthesia with the patient or authorized representative who has indicated his/her understanding and acceptance.     Dental advisory given  Plan Discussed with: CRNA, Anesthesiologist and Surgeon  Anesthesia Plan Comments: (PEC block with Exparel. GA/LMA)        Anesthesia Quick Evaluation

## 2020-12-27 ENCOUNTER — Ambulatory Visit
Admission: RE | Admit: 2020-12-27 | Discharge: 2020-12-27 | Disposition: A | Discharge status: Home / Self Care | Payer: Medicare Other | Source: Ambulatory Visit | Attending: General Surgery | Admitting: General Surgery

## 2020-12-27 ENCOUNTER — Encounter: Payer: Medicare Other | Admitting: Internal Medicine

## 2020-12-27 ENCOUNTER — Ambulatory Visit (HOSPITAL_COMMUNITY)
Admission: RE | Admit: 2020-12-27 | Discharge: 2020-12-27 | Disposition: A | Discharge status: Home / Self Care | Payer: Medicare Other | Source: Ambulatory Visit | Attending: General Surgery | Admitting: General Surgery

## 2020-12-27 ENCOUNTER — Encounter (HOSPITAL_COMMUNITY): Payer: Self-pay | Admitting: General Surgery

## 2020-12-27 ENCOUNTER — Encounter (HOSPITAL_COMMUNITY)
Admission: RE | Disposition: A | Discharge status: Home / Self Care | Payer: Self-pay | Source: Ambulatory Visit | Attending: General Surgery

## 2020-12-27 ENCOUNTER — Ambulatory Visit (HOSPITAL_COMMUNITY): Payer: Medicare Other | Admitting: Anesthesiology

## 2020-12-27 ENCOUNTER — Other Ambulatory Visit: Payer: Self-pay

## 2020-12-27 DIAGNOSIS — C50411 Malignant neoplasm of upper-outer quadrant of right female breast: Secondary | ICD-10-CM | POA: Insufficient documentation

## 2020-12-27 DIAGNOSIS — J449 Chronic obstructive pulmonary disease, unspecified: Secondary | ICD-10-CM | POA: Diagnosis not present

## 2020-12-27 DIAGNOSIS — M419 Scoliosis, unspecified: Secondary | ICD-10-CM | POA: Diagnosis not present

## 2020-12-27 DIAGNOSIS — Z170 Estrogen receptor positive status [ER+]: Secondary | ICD-10-CM

## 2020-12-27 DIAGNOSIS — R928 Other abnormal and inconclusive findings on diagnostic imaging of breast: Secondary | ICD-10-CM | POA: Diagnosis not present

## 2020-12-27 DIAGNOSIS — G8918 Other acute postprocedural pain: Secondary | ICD-10-CM | POA: Diagnosis not present

## 2020-12-27 DIAGNOSIS — R921 Mammographic calcification found on diagnostic imaging of breast: Secondary | ICD-10-CM | POA: Diagnosis not present

## 2020-12-27 DIAGNOSIS — C50911 Malignant neoplasm of unspecified site of right female breast: Secondary | ICD-10-CM | POA: Diagnosis not present

## 2020-12-27 DIAGNOSIS — K219 Gastro-esophageal reflux disease without esophagitis: Secondary | ICD-10-CM | POA: Diagnosis not present

## 2020-12-27 DIAGNOSIS — N6011 Diffuse cystic mastopathy of right breast: Secondary | ICD-10-CM | POA: Diagnosis not present

## 2020-12-27 HISTORY — PX: BREAST LUMPECTOMY WITH RADIOACTIVE SEED AND SENTINEL LYMPH NODE BIOPSY: SHX6550

## 2020-12-27 SURGERY — BREAST LUMPECTOMY WITH RADIOACTIVE SEED AND SENTINEL LYMPH NODE BIOPSY
Anesthesia: Regional | Site: Breast | Laterality: Right

## 2020-12-27 MED ORDER — EPHEDRINE SULFATE-NACL 50-0.9 MG/10ML-% IV SOSY
PREFILLED_SYRINGE | INTRAVENOUS | Status: DC | PRN
  Administered 2020-12-27: 5 mg via INTRAVENOUS

## 2020-12-27 MED ORDER — CHLORHEXIDINE GLUCONATE CLOTH 2 % EX PADS: 6.0000 | MEDICATED_PAD | Freq: Once | CUTANEOUS | Status: DC

## 2020-12-27 MED ORDER — ONDANSETRON HCL 4 MG/2ML IJ SOLN
INTRAMUSCULAR | Status: DC | PRN
  Administered 2020-12-27: 4 mg via INTRAVENOUS

## 2020-12-27 MED ORDER — MIDAZOLAM HCL 2 MG/2ML IJ SOLN
INTRAMUSCULAR | Status: AC
  Filled 2020-12-27: qty 2

## 2020-12-27 MED ORDER — MIDAZOLAM HCL 5 MG/5ML IJ SOLN
INTRAMUSCULAR | Status: DC | PRN
  Administered 2020-12-27: 1 mg via INTRAVENOUS

## 2020-12-27 MED ORDER — AMISULPRIDE (ANTIEMETIC) 5 MG/2ML IV SOLN: 10.0000 mg | Freq: Once | INTRAVENOUS | Status: DC | PRN

## 2020-12-27 MED ORDER — BUPIVACAINE HCL (PF) 0.5 % IJ SOLN
INTRAMUSCULAR | Status: DC | PRN
  Administered 2020-12-27: 20 mL

## 2020-12-27 MED ORDER — PHENYLEPHRINE 40 MCG/ML (10ML) SYRINGE FOR IV PUSH (FOR BLOOD PRESSURE SUPPORT)
PREFILLED_SYRINGE | INTRAVENOUS | Status: DC | PRN
  Administered 2020-12-27: 80 ug via INTRAVENOUS

## 2020-12-27 MED ORDER — BUPIVACAINE LIPOSOME 1.3 % IJ SUSP
INTRAMUSCULAR | Status: DC | PRN
  Administered 2020-12-27: 10 mL

## 2020-12-27 MED ORDER — FENTANYL CITRATE (PF) 250 MCG/5ML IJ SOLN
INTRAMUSCULAR | Status: AC
  Filled 2020-12-27: qty 5

## 2020-12-27 MED ORDER — TRAMADOL HCL 50 MG PO TABS: 100.0000 mg | ORAL_TABLET | Freq: Four times a day (QID) | ORAL | 0 refills | Status: DC | PRN

## 2020-12-27 MED ORDER — ENSURE PRE-SURGERY PO LIQD: 296.0000 mL | Freq: Once | ORAL | Status: DC

## 2020-12-27 MED ORDER — LIDOCAINE 2% (20 MG/ML) 5 ML SYRINGE
INTRAMUSCULAR | Status: DC | PRN
  Administered 2020-12-27: 60 mg via INTRAVENOUS

## 2020-12-27 MED ORDER — BUPIVACAINE-EPINEPHRINE 0.25% -1:200000 IJ SOLN
INTRAMUSCULAR | Status: DC | PRN
  Administered 2020-12-27: 8 mL

## 2020-12-27 MED ORDER — ONDANSETRON HCL 4 MG/2ML IJ SOLN: 4.0000 mg | Freq: Once | INTRAMUSCULAR | Status: DC | PRN

## 2020-12-27 MED ORDER — ACETAMINOPHEN 500 MG PO TABS: 1000.0000 mg | ORAL_TABLET | ORAL | Status: AC

## 2020-12-27 MED ORDER — CHLORHEXIDINE GLUCONATE 0.12 % MT SOLN: 15.0000 mL | Freq: Once | OROMUCOSAL | Status: AC

## 2020-12-27 MED ORDER — FENTANYL CITRATE (PF) 100 MCG/2ML IJ SOLN: 25.0000 ug | INTRAMUSCULAR | Status: DC | PRN

## 2020-12-27 MED ORDER — CHLORHEXIDINE GLUCONATE 0.12 % MT SOLN
OROMUCOSAL | Status: AC
  Administered 2020-12-27: 15 mL via OROMUCOSAL
  Filled 2020-12-27: qty 15

## 2020-12-27 MED ORDER — CEFAZOLIN SODIUM-DEXTROSE 2-4 GM/100ML-% IV SOLN
INTRAVENOUS | Status: AC
  Filled 2020-12-27: qty 100

## 2020-12-27 MED ORDER — ORAL CARE MOUTH RINSE: 15.0000 mL | Freq: Once | OROMUCOSAL | Status: AC

## 2020-12-27 MED ORDER — CEFAZOLIN SODIUM-DEXTROSE 2-4 GM/100ML-% IV SOLN
2.0000 g | INTRAVENOUS | Status: AC
  Administered 2020-12-27: 2 g via INTRAVENOUS

## 2020-12-27 MED ORDER — BUPIVACAINE-EPINEPHRINE (PF) 0.25% -1:200000 IJ SOLN
INTRAMUSCULAR | Status: AC
  Filled 2020-12-27: qty 30

## 2020-12-27 MED ORDER — PROPOFOL 10 MG/ML IV BOLUS
INTRAVENOUS | Status: DC | PRN
  Administered 2020-12-27: 100 mg via INTRAVENOUS
  Administered 2020-12-27: 50 mg via INTRAVENOUS

## 2020-12-27 MED ORDER — MAGTRACE LYMPHATIC TRACER
INTRAMUSCULAR | Status: DC | PRN
  Administered 2020-12-27: 1 mL via INTRAMUSCULAR

## 2020-12-27 MED ORDER — LACTATED RINGERS IV SOLN: INTRAVENOUS | Status: DC

## 2020-12-27 MED ORDER — PROPOFOL 10 MG/ML IV BOLUS
INTRAVENOUS | Status: AC
  Filled 2020-12-27: qty 20

## 2020-12-27 MED ORDER — FENTANYL CITRATE (PF) 100 MCG/2ML IJ SOLN
INTRAMUSCULAR | Status: DC | PRN
  Administered 2020-12-27 (×2): 25 ug via INTRAVENOUS

## 2020-12-27 MED ORDER — 0.9 % SODIUM CHLORIDE (POUR BTL) OPTIME
TOPICAL | Status: DC | PRN
  Administered 2020-12-27: 1000 mL

## 2020-12-27 MED ORDER — ACETAMINOPHEN 500 MG PO TABS
ORAL_TABLET | ORAL | Status: AC
  Administered 2020-12-27: 1000 mg via ORAL
  Filled 2020-12-27: qty 2

## 2020-12-27 SURGICAL SUPPLY — 38 items
APPLIER CLIP 9.375 MED OPEN (MISCELLANEOUS) ×2
BAG COUNTER SPONGE SURGICOUNT (BAG) ×2 IMPLANT
BINDER BREAST XLRG (GAUZE/BANDAGES/DRESSINGS) ×2 IMPLANT
CANISTER SUCT 3000ML PPV (MISCELLANEOUS) ×2 IMPLANT
CHLORAPREP W/TINT 26 (MISCELLANEOUS) ×2 IMPLANT
CLIP APPLIE 9.375 MED OPEN (MISCELLANEOUS) ×1 IMPLANT
CLSR STERI-STRIP ANTIMIC 1/2X4 (GAUZE/BANDAGES/DRESSINGS) ×2 IMPLANT
COVER PROBE W GEL 5X96 (DRAPES) ×2 IMPLANT
COVER SURGICAL LIGHT HANDLE (MISCELLANEOUS) ×2 IMPLANT
DERMABOND ADVANCED (GAUZE/BANDAGES/DRESSINGS) ×1
DERMABOND ADVANCED .7 DNX12 (GAUZE/BANDAGES/DRESSINGS) ×1 IMPLANT
DEVICE DUBIN SPECIMEN MAMMOGRA (MISCELLANEOUS) ×2 IMPLANT
DRAPE CHEST BREAST 15X10 FENES (DRAPES) ×2 IMPLANT
ELECT COATED BLADE 2.86 ST (ELECTRODE) ×2 IMPLANT
ELECT REM PT RETURN 9FT ADLT (ELECTROSURGICAL) ×2
ELECTRODE REM PT RTRN 9FT ADLT (ELECTROSURGICAL) ×1 IMPLANT
GLOVE SURG ENC MOIS LTX SZ7 (GLOVE) ×2 IMPLANT
GLOVE SURG UNDER POLY LF SZ7.5 (GLOVE) ×2 IMPLANT
GOWN STRL REUS W/ TWL LRG LVL3 (GOWN DISPOSABLE) ×2 IMPLANT
GOWN STRL REUS W/TWL LRG LVL3 (GOWN DISPOSABLE) ×2
KIT BASIN OR (CUSTOM PROCEDURE TRAY) ×2 IMPLANT
KIT MARKER MARGIN INK (KITS) ×2 IMPLANT
NEEDLE HYPO 25GX1X1/2 BEV (NEEDLE) ×2 IMPLANT
NS IRRIG 1000ML POUR BTL (IV SOLUTION) ×2 IMPLANT
PACK GENERAL/GYN (CUSTOM PROCEDURE TRAY) ×2 IMPLANT
PAD ABD 8X10 STRL (GAUZE/BANDAGES/DRESSINGS) ×4 IMPLANT
STRIP CLOSURE SKIN 1/2X4 (GAUZE/BANDAGES/DRESSINGS) ×2 IMPLANT
SUT MNCRL AB 4-0 PS2 18 (SUTURE) ×2 IMPLANT
SUT MON AB 5-0 PS2 18 (SUTURE) ×2 IMPLANT
SUT SILK 2 0 SH (SUTURE) ×2 IMPLANT
SUT VIC AB 2-0 SH 27 (SUTURE) ×3
SUT VIC AB 2-0 SH 27XBRD (SUTURE) ×3 IMPLANT
SUT VIC AB 3-0 SH 27 (SUTURE) ×2
SUT VIC AB 3-0 SH 27X BRD (SUTURE) ×2 IMPLANT
SYR CONTROL 10ML LL (SYRINGE) ×2 IMPLANT
TOWEL GREEN STERILE (TOWEL DISPOSABLE) ×2 IMPLANT
TOWEL GREEN STERILE FF (TOWEL DISPOSABLE) ×2 IMPLANT
TRACER MAGTRACE VIAL (MISCELLANEOUS) ×2 IMPLANT

## 2020-12-27 NOTE — Anesthesia Postprocedure Evaluation (Signed)
Anesthesia Post Note  Patient: Jennifer [name]  Procedure(s) Performed: RIGHT BREAST LUMPECTOMY WITH RADIOACTIVE SEED AND AXILLARY SENTINEL LYMPH NODE BIOPSY (Right: Breast)     Patient location during evaluation: PACU Anesthesia Type: Regional and General Level of consciousness: awake Pain management: pain level controlled Vital Signs Assessment: post-procedure vital signs reviewed and stable Respiratory status: spontaneous breathing and respiratory function stable Cardiovascular status: stable Postop Assessment: no apparent nausea or vomiting Anesthetic complications: no   No notable events documented.  Last Vitals:  Vitals:   12/27/20 0913 12/27/20 0928  BP: (!) 116/56 109/62  Pulse: 70 68  Resp: (!) 23 16  Temp:  36.5 C  SpO2: 92% 91%    Last Pain:  Vitals:   12/27/20 0928  TempSrc:   PainSc: 2                  Merlinda Frederick

## 2020-12-27 NOTE — Op Note (Signed)
Preoperative diagnosis: Clinical stage I right breast cancer Postoperative diagnosis: Same as above Procedure: 1.  Right breast radioactive seed guided lumpectomy 2.  Right deep axillary sentinel lymph node biopsy 3.  Injection of mag trace for sentinel lymph node identification Surgeon: Dr. Serita Grammes Anesthesia: General with a pectoral block Estimated blood loss: Minimal Complications: None Drains: None Specimens: 1.  Right breast lumpectomy containing radioactive seed and clip confirmed by mammography this was marked with paint 2.  Additional posterior, lateral and inferior margins marked with sutures short superior, long lateral, double deep 3.  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 She underwent a screening mammogram that shows B density breast. She had a mass on the right breast in the upper outer quadrant. On ultrasound this is a 1.2 x 1 x 0.5 cm mass. Axillary ultrasound is negative. 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 This is HER2 negative. This is greater than 90% ER and PR positive. We discussed options and elected to proceed with lumpectomy/sn biopsy.   Procedure: After informed consent was obtained the patient was given antibiotics.  SCDs were in place.  She underwent a pectoral block.  Timeout was performed.  I then prepped the area around the areola on the right side.  I then injected 2 cc of mag trace in the subareolar position.  She was then later taken to the operating room.  She was placed under general anesthesia without complication.  She was prepped and draped in the standard sterile surgical fashion.  Surgical timeout was then performed.  I first did the  lumpectomy.   I identified the radioactive seed in the superior breast.  I then made a curvilinear incision in the periareolar position after infiltrating Marcaine.I then used cautery to develop planes.  I identified the radioactive seed.  I removed the seed and the surrounding tissue with an attempt to get a clear margin.  Mammogram confirmed removal of the clip and the seed.  I thought I might be close to several margins so I removed additional margins and marked it as above.  This was then passed off the table and sent to pathology.  This was hemostatic.  I closed the cavity down with 2-0 Vicryl.  The skin was closed with 3-0 Vicryl and 5-0  Monocryl.  Glue and Steri-Strips were eventually applied.  I then was able to identify the mag trace in the axilla.  I made an incision below the axillary hairline.  I carried this to the axillary fascia.  I was able to identify what appeared to be 2 or 3 brown stained lymph nodes that also had activity on the probe.  I removef these.  There was no additional palpable adenopathy or any activity in the axilla.  I then obtained hemostasis.  I closed the axillary fascia with 2-0 Vicryl.  The skin was closed with 3-0 Vicryl and 4-0 Monocryl.  Glue and Steri-Strips were applied.  She tolerated this well.  She was extubated and transferred to recovery in stable condition.

## 2020-12-27 NOTE — Interval H&P Note (Signed)
History and Physical Interval Note:  12/27/2020 6:58 AM  Jennifer [name]  has presented today for surgery, with the diagnosis of RIGHT BREAST CANCER.  The various methods of treatment have been discussed with the patient and family. After consideration of risks, benefits and other options for treatment, the patient has consented to  Procedure(s): RIGHT BREAST LUMPECTOMY WITH RADIOACTIVE SEED AND AXILLARY SENTINEL LYMPH NODE BIOPSY (Right) as a surgical intervention.  The patient's history has been reviewed, patient examined, no change in status, stable for surgery.  I have reviewed the patient's chart and labs.  Questions were answered to the patient's satisfaction.     Rolm Bookbinder

## 2020-12-27 NOTE — H&P (Signed)
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 She has no prior breast history. She had no mass or discharge. She has no family history. She underwent a screening mammogram that shows B density breast. She had a mass on the right breast in the upper outer quadrant. On ultrasound this is a 1.2 x 1 x 0.5 cm mass. Axillary ultrasound is negative. 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 This is HER2 negative. This is greater than 90% ER and PR positive. She is here today with her daughter to discuss options.  Review of Systems: A complete review of systems was obtained from the patient. I have reviewed this information and discussed as appropriate with the patient. See HPI as well for other ROS.  Review of Systems  Respiratory: Positive for shortness of breath.  Gastrointestinal: Positive for heartburn.  Psychiatric/Behavioral: Positive for memory loss. The patient is nervous/anxious.   Medical History: Past Medical History:  Diagnosis Date   Asthma   COPD (chronic obstructive pulmonary disease) (CMS-HCC)   Depression   H/O mammogram 02/04/2011   History of bone density study 07/14/2006   Hx of adenomatous colonic polyps 04/11/2015   Migraines   Scoliosis   Patient Active Problem List  Diagnosis   Restrictive lung disease   Disorder of skin and subcutaneous tissue   Asthma   Conjunctivitis   Benign essential hypertension   Gastritis and gastroduodenitis   Esophageal reflux   Pure hypercholesterolemia   Other abnormal glucose   Other disorders of lung   Adenomatous polyp   Shortness of breath   Gastro-esophageal reflux disease without esophagitis   Gastritis without bleeding   Essential (primary) hypertension   Disorder of skin or subcutaneous tissue   Hx of adenomatous colonic polyps   Past Surgical History:  Procedure Laterality Date    CHOLECYSTECTOMY   COLONOSCOPY 02/20/1994  Normal Colon   COLONOSCOPY 03/27/2002  Adenomatous Polyps ([facility])   COLONOSCOPY 10/02/2005  Adenomatous Polyps   COLONOSCOPY 02/10/2010  PH Adenomatous Polyps: [facility] 01/2015; Recall Ltr mailed 12/26/2014 (dw)   DILATION AND CURETTAGE, DIAGNOSTIC / THERAPEUTIC   EGD 02/05/1994  H Pylori   EGD 03/27/2002  ([facility])   EGD 10/02/2005  No repeat per RTE   EGD 01/26/2013  Gastritis w/low grade dysplasia   EGD 04/28/2013  Gastritis w/low grade dysplasia: [facility] 12/2013   EGD 02/19/2014  Adenomatous Polyp in Stomach w/low grade dysplasia: [facility] 02/2015; Recall Ltr mailed 12/26/2014 (dw)   HYSTERECTOMY VAGINAL   MASS REMOVED FROM STOMACH   NECK SURGERY   Allergies  Allergen Reactions   Advair Diskus [Fluticasone Propion-Salmeterol] Other (See Comments)  Blisters in the mouth   Codeine Nausea   Current Outpatient Medications on File Prior to Visit  Medication Sig Dispense Refill   gabapentin (NEURONTIN) 300 MG capsule Take by mouth   acetaminophen (TYLENOL) 500 mg capsule Take 500 mg by mouth 4 (four) times daily Patient is taking medication for arthritis. Patient takes 1000 mg's in the morning and 105m's at night for pain   albuterol (PROVENTIL) 2.5 mg /3 mL (0.083 %) nebulizer solution Take 3 mLs (2.5 mg total) by nebulization every 6 (six) hours as needed for Wheezing. 75 mL 11   calcium carbonate-vitamin D3 (OS-CAL 500+D) 500 mg(1,2540m -200 unit tablet Take 1 tablet by mouth 2 (two) times daily with meals.   citalopram (CELEXA) 40  MG tablet 1 tab by mouth daily   clonazePAM (KLONOPIN) 0.5 MG tablet 0.5 tab by mouth daily   cyanocobalamin, vitamin B-12, 5,000 mcg Cap Take by mouth   fluticasone (FLONASE) 50 mcg/actuation nasal spray Place 2 sprays into both nostrils once daily   hydrochlorothiazide (HYDRODIURIL) 25 MG tablet 1 tab by mouth daily   ibuprofen (ADVIL,MOTRIN) 200 MG tablet Take 200 mg by mouth every 6 (six) hours as needed for Pain.    Lactobac comb 3-FOS-pantethine 300-250 million cell-mg Cap Take by mouth.   levalbuterol (XOPENEX HFA) inhaler Inhale 2 inhalations into the lungs every 6 (six) hours as needed for Wheezing for up to 364 days 15 g 5   pantoprazole (PROTONIX) 40 MG DR tablet Take 40 mg by mouth once daily.    pravastatin (PRAVACHOL) 10 MG tablet Take 20 mg by mouth once daily Patient is taking 20 mg of medication once a day   ranitidine (ZANTAC) 150 MG tablet Take 150 mg by mouth nightly.   tiotropium-olodateroL (STIOLTO RESPIMAT) 2.5-2.5 mcg/actuation inhaler Inhale 1 inhalation into the lungs once daily for 30 days 4 g 11   No current facility-administered medications on file prior to visit.   Family History  Problem Relation Age of Onset   Stroke Mother   Other Mother  Brain Tumor   Cancer Father    Social History   Tobacco Use  Smoking Status Never  Smokeless Tobacco Never    Social History   Socioeconomic History   Marital status: Married  Tobacco Use   Smoking status: Never   Smokeless tobacco: Never  Substance and Sexual Activity   Alcohol use: No   Drug use: No   Objective:   Vitals:  12/06/20 1032  BP: 122/72  Pulse: 87  Temp: 36.7 C (98 F)  SpO2: 95%  Weight: 66.7 kg (147 lb)  Height: 157.5 cm (5' 2" )   Body mass index is 26.89 kg/m.  Physical Exam Constitutional:  Appearance: Normal appearance.  Chest:  Breasts: Right: No inverted nipple, mass or nipple discharge.  Left: No inverted nipple, mass or nipple discharge.  Lymphadenopathy:  Upper Body:  Right upper body: No supraclavicular or axillary adenopathy.  Left upper body: No supraclavicular or axillary adenopathy.  Neurological:  Mental Status: She is alert.    Assessment and Plan:  Right breast seed guided lumpectomy, right axillary sentinel node biopsy  We discussed the staging and pathophysiology of breast cancer. We discussed all of the different options for treatment for breast cancer including  surgery, chemotherapy, radiation therapy, Herceptin, and antiestrogen therapy. We discussed a sentinel lymph node biopsy as she does not appear to having lymph node involvement right now. We discussed the performance of that with injection of radioactive tracer. We discussed that there is a chance of having a positive node with a sentinel lymph node biopsy and we will await the permanent pathology to make any other first further decisions in terms of her treatment. We discussed up to a 5% risk lifetime of chronic shoulder pain as well as lymphedema associated with a sentinel lymph node biopsy. We discussed the options for treatment of the breast cancer which included lumpectomy versus a mastectomy. We discussed the performance of the lumpectomy with radioactive seed placement. We discussed a 5-10% chance of a positive margin requiring reexcision in the operating room. We also discussed that she might need radiation therapy if she undergoes lumpectomy although over 70 with this tumor and lung disease could omit. We discussed  mastectomy and the postoperative care for that as well. Mastectomy can be followed by reconstruction. The decision for lumpectomy vs mastectomy has no impact on decision for chemotherapy. Most mastectomy patients will not need radiation therapy. We discussed that there is no difference in her survival whether she undergoes lumpectomy with radiation therapy or antiestrogen therapy versus a mastectomy. There is also no real difference between her recurrence in the breast. We discussed the risks of operation including bleeding, infection, possible reoperation. She understands her further therapy will be based on what her stages at the time of her operation. Will refer to rad onc and med once

## 2020-12-27 NOTE — Transfer of Care (Signed)
Immediate Anesthesia Transfer of Care Note  Patient: Jennifer [name]  Procedure(s) Performed: RIGHT BREAST LUMPECTOMY WITH RADIOACTIVE SEED AND AXILLARY SENTINEL LYMPH NODE BIOPSY (Right: Breast)  Patient Location: PACU  Anesthesia Type:General  Level of Consciousness: drowsy and patient cooperative  Airway & Oxygen Therapy: Patient Spontanous Breathing  Post-op Assessment: Report given to RN and Post -op Vital signs reviewed and stable  Post vital signs: Reviewed and stable  Last Vitals:  Vitals Value Taken Time  BP 128/67 12/27/20 0858  Temp    Pulse 72 12/27/20 0901  Resp 19 12/27/20 0901  SpO2 91 % 12/27/20 0901  Vitals shown include unvalidated device data.  Last Pain:  Vitals:   12/27/20 0622  TempSrc:   PainSc: 0-No pain      Patients Stated Pain Goal: 2 (16/10/96 0454)  Complications: No notable events documented.

## 2020-12-27 NOTE — Anesthesia Procedure Notes (Signed)
Anesthesia Regional Block: Pectoralis block   Pre-Anesthetic Checklist: , timeout performed,  Correct Patient, Correct Site, Correct Laterality,  Correct Procedure, Correct Position, site marked,  Risks and benefits discussed,  Surgical consent,  Pre-op evaluation,  At surgeon's request and post-op pain management  Laterality: Right  Prep: chloraprep       Needles:  Injection technique: Single-shot  Needle Type: Echogenic Stimulator Needle          Additional Needles:   Procedures:,,,, ultrasound used (permanent image in chart),,    Narrative:  Start time: 12/27/2020 7:05 AM End time: 12/27/2020 7:10 AM Injection made incrementally with aspirations every 5 mL.  Performed by: Personally  Anesthesiologist: Merlinda Frederick, MD  Additional Notes: A functioning IV was confirmed and monitors were applied.  Sterile prep and drape, hand hygiene and sterile gloves were used.  Negative aspiration and test dose prior to incremental administration of local anesthetic. The patient tolerated the procedure well.Ultrasound  guidance: relevant anatomy identified, needle position confirmed, local anesthetic spread visualized around nerve(s), vascular puncture avoided.  Image printed for medical record.

## 2020-12-27 NOTE — Anesthesia Procedure Notes (Signed)
Procedure Name: Intubation Date/Time: 12/27/2020 7:36 AM Performed by: Georgia Duff, CRNA Pre-anesthesia Checklist: Patient identified, Emergency Drugs available, Suction available and Patient being monitored Patient Re-evaluated:Patient Re-evaluated prior to induction Oxygen Delivery Method: Circle System Utilized Preoxygenation: Pre-oxygenation with 100% oxygen Induction Type: IV induction Ventilation: Mask ventilation without difficulty LMA: LMA inserted LMA Size: 4.0 Tube type: Oral Number of attempts: 1 Airway Equipment and Method: Oral airway Placement Confirmation: positive ETCO2 and breath sounds checked- equal and bilateral Tube secured with: Tape Dental Injury: Teeth and Oropharynx as per pre-operative assessment

## 2020-12-27 NOTE — Discharge Instructions (Signed)
[facility] Office Phone Number [phone]  BREAST BIOPSY/ PARTIAL MASTECTOMY: POST OP INSTRUCTIONS Take 400 mg of ibuprofen every 8 hours or 650 mg tylenol every 6 hours for next 72 hours then as needed. Use ice several times daily also. Always review your discharge instruction sheet given to you by the facility where your surgery was performed.  IF YOU HAVE DISABILITY OR FAMILY LEAVE FORMS, YOU MUST BRING THEM TO THE OFFICE FOR PROCESSING.  DO NOT GIVE THEM TO YOUR DOCTOR.  A prescription for pain medication may be given to you upon discharge.  Take your pain medication as prescribed, if needed.  If narcotic pain medicine is not needed, then you may take acetaminophen (Tylenol), naprosyn (Alleve) or ibuprofen (Advil) as needed. Take your usually prescribed medications unless otherwise directed If you need a refill on your pain medication, please contact your pharmacy.  They will contact our office to request authorization.  Prescriptions will not be filled after 5pm or on week-ends. You should eat very light the first 24 hours after surgery, such as soup, crackers, pudding, etc.  Resume your normal diet the day after surgery. Most patients will experience some swelling and bruising in the breast.  Ice packs and a good support bra will help.  Wear the breast binder provided or a sports bra for 72 hours day and night.  After that wear a sports bra during the day until you return to the office. Swelling and bruising can take several days to resolve.  It is common to experience some constipation if taking pain medication after surgery.  Increasing fluid intake and taking a stool softener will usually help or prevent this problem from occurring.  A mild laxative (Milk of Magnesia or Miralax) should be taken according to package directions if there are no bowel movements after 48 hours. Unless discharge instructions indicate otherwise, you may remove your bandages 48 hours after surgery  and you may shower at that time.  You may have steri-strips (small skin tapes) in place directly over the incision.  These strips should be left on the skin for 7-10 days and will come off on their own.  If your surgeon used skin glue on the incision, you may shower in 24 hours.  The glue will flake off over the next 2-3 weeks.  Any sutures or staples will be removed at the office during your follow-up visit. ACTIVITIES:  You may resume regular daily activities (gradually increasing) beginning the next day.  Wearing a good support bra or sports bra minimizes pain and swelling.  You may have sexual intercourse when it is comfortable. You may drive when you no longer are taking prescription pain medication, you can comfortably wear a seatbelt, and you can safely maneuver your car and apply brakes. RETURN TO WORK:  ______________________________________________________________________________________ You should see your doctor in the office for a follow-up appointment approximately two weeks after your surgery.  Your doctor's nurse will typically make your follow-up appointment when she calls you with your pathology report.  Expect your pathology report 3-4 business days after your surgery.  You may call to check if you do not hear from us after three days. OTHER INSTRUCTIONS: _______________________________________________________________________________________________ _____________________________________________________________________________________________________________________________________ _____________________________________________________________________________________________________________________________________ _____________________________________________________________________________________________________________________________________  WHEN TO CALL DR Adie Vilar: Fever over 101.0 Nausea and/or vomiting. Extreme swelling or bruising. Continued bleeding from incision. Increased  pain, redness, or drainage from the incision.  The clinic staff is available to answer your questions during regular business hours.  Please don't hesitate to call   and ask to speak to one of the nurses for clinical concerns.  If you have a medical emergency, go to the nearest emergency room or call 911.  A surgeon from [facility] is always on call at the hospital.  For further questions, please visit [email or web address]  

## 2020-12-28 ENCOUNTER — Encounter (HOSPITAL_COMMUNITY): Payer: Self-pay | Admitting: General Surgery

## 2020-12-30 LAB — SURGICAL PATHOLOGY

## 2021-01-03 ENCOUNTER — Encounter: Payer: Self-pay | Admitting: *Deleted

## 2021-01-03 ENCOUNTER — Telehealth: Payer: Self-pay | Admitting: *Deleted

## 2021-01-03 NOTE — Telephone Encounter (Signed)
Ordered oncotype per Dr. Gudena. Faxed requisition to pathology. °

## 2021-01-06 DIAGNOSIS — C50411 Malignant neoplasm of upper-outer quadrant of right female breast: Secondary | ICD-10-CM | POA: Diagnosis not present

## 2021-01-09 DIAGNOSIS — C50411 Malignant neoplasm of upper-outer quadrant of right female breast: Secondary | ICD-10-CM | POA: Diagnosis not present

## 2021-01-09 DIAGNOSIS — Z170 Estrogen receptor positive status [ER+]: Secondary | ICD-10-CM | POA: Diagnosis not present

## 2021-01-10 ENCOUNTER — Encounter: Payer: Self-pay | Admitting: Internal Medicine

## 2021-01-13 DIAGNOSIS — J449 Chronic obstructive pulmonary disease, unspecified: Secondary | ICD-10-CM | POA: Diagnosis not present

## 2021-01-16 ENCOUNTER — Telehealth: Payer: Self-pay | Admitting: *Deleted

## 2021-01-16 ENCOUNTER — Encounter (HOSPITAL_COMMUNITY): Payer: Self-pay

## 2021-01-16 DIAGNOSIS — C50411 Malignant neoplasm of upper-outer quadrant of right female breast: Secondary | ICD-10-CM

## 2021-01-16 NOTE — Telephone Encounter (Signed)
Received oncotype of 3. Physician team notified. Called pt with results discussed chemo not recommended. Informed next step is xrt with Dr. Sondra Come. Received verbal understanding. Referral placed.

## 2021-01-17 ENCOUNTER — Encounter: Payer: Self-pay | Admitting: Hematology

## 2021-01-17 ENCOUNTER — Telehealth: Payer: Self-pay | Admitting: Radiation Oncology

## 2021-01-17 ENCOUNTER — Telehealth: Payer: Self-pay | Admitting: Internal Medicine

## 2021-01-17 NOTE — Telephone Encounter (Signed)
Called patient to schedule a follow up appointment w. Dr. Kinard. No answer, LVM for a return call.  

## 2021-01-17 NOTE — Telephone Encounter (Signed)
Lm on vm to call office to schedule a physical with Dr Nicki Reaper. Per Dr Nicki Reaper

## 2021-01-17 NOTE — Telephone Encounter (Signed)
Patient called in,stated she returning call . I advised patient what note stated about scheduling appt  Physical. Patient advise will call back to schedule that

## 2021-01-21 ENCOUNTER — Encounter: Payer: Self-pay | Admitting: *Deleted

## 2021-01-22 ENCOUNTER — Other Ambulatory Visit: Payer: Self-pay

## 2021-01-22 ENCOUNTER — Ambulatory Visit: Payer: Medicare Other | Attending: General Surgery | Admitting: Rehabilitation

## 2021-01-22 ENCOUNTER — Encounter: Payer: Self-pay | Admitting: Rehabilitation

## 2021-01-22 DIAGNOSIS — Z483 Aftercare following surgery for neoplasm: Secondary | ICD-10-CM

## 2021-01-22 DIAGNOSIS — M25611 Stiffness of right shoulder, not elsewhere classified: Secondary | ICD-10-CM

## 2021-01-22 NOTE — Therapy (Signed)
[facility] [place], [place], [address] Phone: [phone]   Fax:  [phone]  Physical Therapy Treatment  Patient Details  Name: Jennifer [name] MRN: [id number] Date of Birth: 1945/06/24 Referring Provider (PT): Dr Donne Hazel   Encounter Date: 01/22/2021   PT End of Session - 01/22/21 1337     Visit Number 2    Number of Visits 2    Date for PT Re-Evaluation 02/05/21    PT Start Time 1307    PT Stop Time 1335    PT Time Calculation (min) 28 min    Activity Tolerance Patient tolerated treatment well    Behavior During Therapy [facility] for tasks assessed/performed             Past Medical History:  Diagnosis Date   Anxiety    panic attacks   Arthritis    Asthma    Bronchitis    COPD (chronic obstructive pulmonary disease) ([place])    Depression    Dyspnea    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   Gastritis    GERD (gastroesophageal reflux disease)    Hypertension    PONV (postoperative nausea and vomiting)    Scoliosis    Ulcer     Past Surgical History:  Procedure Laterality Date   ABDOMINAL HYSTERECTOMY     ANTERIOR CERVICAL DECOMP/DISCECTOMY FUSION N/A 04/19/2017   Procedure: Anterior Cervical Decompression Fusion Cervical Three-Four, Cervical Four-Five, Cervical Five-Six;  Surgeon: Kary Kos, MD;  Location: [place];  Service: Neurosurgery;  Laterality: N/A;  Anterior Cervical Decompression Fusion Cervical Three-Four, Cervical Four-Five, Cervical Five-Six   BREAST BIOPSY Right 11/14/2020   [place] Bx, Venus Clip, Path Pending   BREAST LUMPECTOMY WITH RADIOACTIVE SEED AND SENTINEL LYMPH NODE BIOPSY Right 12/27/2020   Procedure: RIGHT BREAST LUMPECTOMY WITH RADIOACTIVE SEED AND AXILLARY SENTINEL LYMPH NODE BIOPSY;  Surgeon: Rolm Bookbinder, MD;  Location: [place];  Service: General;  Laterality: Right;   [place]   LAPAROSCOPIC REMOVAL OF  MESENTERIC MASS     LUMBAR [place]   PARTIAL HYSTERECTOMY  1981   prolapsed uterus   TUBAL LIGATION  1978    There were no vitals filed for this visit.   Subjective Assessment - 01/22/21 1308     Subjective i feel around 90%.  just a little sore.    Pertinent History Rt lump with SLNB 12/27/20 with 0/2 LNs with radiation pending and no need for chemotherapy. Hx: asthma, COPD uses O2 at night,    Currently in Pain? No/denies                [facility] PT Assessment - 01/22/21 0001       Assessment   Medical Diagnosis Rt breast cancer    Referring Provider (PT) Dr Donne Hazel    Onset Date/Surgical Date 12/25/20    Hand Dominance Right    Prior Therapy no      Precautions   Precaution Comments lymphedema      Balance Screen   Has the patient fallen in the past 6 months No    Has the patient had a decrease in activity level because of a fear of falling?  No    Is the patient reluctant to leave their home because of a fear of falling?  No      Home Environment   Living  Environment Private residence    Living Arrangements Spouse/significant other      Prior Function   Level of [place] Retired      Observation/Other Assessments   Observations skipped incision assessment as pt was checked by Dr. Donne Hazel yesterday with note of no edema and closed inicsions      AROM   Right Shoulder Flexion 130 Degrees    Right Shoulder ABduction 132 Degrees    Right Shoulder External Rotation 60 Degrees               LYMPHEDEMA/ONCOLOGY QUESTIONNAIRE - 01/22/21 0001       Type   Cancer Type Rt breast      Surgeries   Lumpectomy Date 12/27/20    Number Lymph Nodes Removed 2      Treatment   Active Chemotherapy Treatment No    Past Chemotherapy Treatment No    Active Radiation Treatment No    Past Radiation Treatment No    Current Hormone Treatment No    Past Hormone Therapy No      What other symptoms do you have   Are  you Having Heaviness or Tightness No      Right Upper Extremity Lymphedema   10 cm Proximal to Olecranon Process 30 cm    Olecranon Process 25.5 cm    10 cm Proximal to Ulnar Styloid Process 22.5 cm    Just Proximal to Ulnar Styloid Process 16.5 cm    Across Hand at [employer] 18 cm    At [place] of 2nd Digit 5.9 cm                                      PT Long Term Goals - 01/22/21 1339       PT LONG TERM GOAL #1   Title Pt will return to baseline AROM of the Lt shoulder    Status Achieved                   Plan - 01/22/21 1338     Clinical Impression Statement Pt has returned to baseline AROM with even slight improvements in flexion with HEP initiation.  Pt was educated on scar massage, use of compression bra, lymphedema risk reduction as she is not able to do the ABC class, and reasons for return to PT.    PT Next Visit Plan 3 month SOZO    Consulted and Agree with Plan of Care Patient             Patient will benefit from skilled therapeutic intervention in order to improve the following deficits and impairments:     Visit Diagnosis: Stiffness of right shoulder, not elsewhere classified  Aftercare following surgery for neoplasm     Problem List Patient Active Problem List   Diagnosis Date Noted   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female, estrogen receptor positive ([place]) 12/17/2020   Nasal congestion 02/04/2020   Sore throat 01/25/2020   Neck pain 08/19/2019   B12 deficiency 08/19/2019   Knee pain 02/04/2019   Memory change 02/04/2019   Radiculopathy of cervical spine 04/20/2017   Spondylosis of cervical spine 04/19/2017   Ear pain, left 03/27/2017   Right arm pain 11/12/2016   Vertigo 09/02/2016   Left low back pain 05/22/2015   Left hip pain 05/22/2015   SOB (shortness  of breath) 11/11/2014   Right shoulder pain 07/16/2014   Health care maintenance 06/03/2014   Arm skin lesion, left  06/18/2013   URI (upper respiratory infection) 03/03/2013   Hyperglycemia 11/12/2012   Hypercholesterolemia 11/12/2012   Essential hypertension, benign 05/12/2012   GERD (gastroesophageal reflux disease) 05/12/2012   Gastritis 05/12/2012   History of colonic polyps 05/12/2012   Asthma 12/13/2011   Chronic restrictive lung disease 05/11/2011    Stark Bray, PT 01/22/2021, 1:39 PM  [facility] [address] [place], [place], [address] Phone: [phone]   Fax:  [phone]  Name: Jennifer [name] MRN: [id number] Date of Birth: 06/05/1945

## 2021-01-22 NOTE — Progress Notes (Signed)
Location of Breast Cancer: upper outer quadrant of right breast   Histology per Pathology Report:  A. BREAST, RIGHT AT 11:00, 5 CM FROM THE NIPPLE; ULTRASOUND-GUIDED CORE  NEEDLE BIOPSY:  - INVASIVE MAMMARY CARCINOMA, NO SPECIAL TYPE.   Receptor Status:  Estrogen Receptor (ER) Status: POSITIVE          Percentage of cells with nuclear positivity: >90%          Average intensity of staining: Strong   Progesterone Receptor (PgR) Status: POSITIVE          Percentage of cells with nuclear positivity: >90%          Average intensity of staining: Strong   HER2 (by immunohistochemistry): NEGATIVE (Score 1+)  Ki-67: Not performed    Did patient present with symptoms (if so, please note symptoms) or was this found on screening mammography?: She underwent a screening mammogram that shows B density breast. She had a mass on the right breast in the upper outer quadrant.   Past/Anticipated interventions by surgeon, if any:  Procedure: 1.  Right breast radioactive seed guided lumpectomy 2.  Right deep axillary sentinel lymph node biopsy 3.  Injection of mag trace for sentinel lymph node identification Surgeon: Dr. Serita Grammes  Past/Anticipated interventions by medical oncology, if any: Dr Burr Medico PLAN:  -proceed with lumpectomy on 12/27/20 -Oncotype on surgical sample  -I will order repeat DEXA to be done sometime after surgery -f/u after RT or sooner if needed    Lymphedema issues, if any:  no     Pain issues, if any:  tenderness to right breast and axilla    SAFETY ISSUES: Prior radiation? no  Pacemaker/ICD? no  Possible current pregnancy? no, hysterectomy Is the patient on methotrexate? no    Current Complaints / other details:  none    Vitals:   01/27/21 1329  BP: (!) 121/51  Pulse: 71  Resp: 20  Temp: 97.6 F (36.4 C)  SpO2: 97%  Weight: 148 lb 9.6 oz (67.4 kg)  Height: 5' 1.5" (1.562 m)

## 2021-01-22 NOTE — Patient Instructions (Addendum)
° ° °   [facility]  [address]  [place] [address]  [phone]   Scar massage You can begin gentle scar massage to you incision sites. Gently place one hand on the incision and move the skin (without sliding on the skin) in various directions. Do this for a few minutes and then you can gently massage either coconut oil or vitamin E cream into the scars.  Compression garment You should continue wearing your compression bra until you feel like you no longer have swelling.  Home exercise Program Continue doing the exercises you were given until you feel like you can do them without feeling any tightness at the end.   Walking Program Studies show that 30 minutes of walking per day (fast enough to elevate your heart rate) can significantly reduce the risk of a cancer recurrence. If you can't walk due to other medical reasons, we encourage you to find another activity you could do (like a stationary bike or water exercise).  Posture After breast cancer surgery, people frequently sit with rounded shoulders posture because it puts their incisions on slack and feels better. If you sit like this and scar tissue forms in that position, you can become very tight and have pain sitting or standing with good posture. Try to be aware of your posture and sit and stand up tall to heal properly.  Follow up PT: It is recommended you return every 3 months for the first 3 years following surgery to be assessed on the SOZO machine for an L-Dex score. This helps prevent clinically significant lymphedema in 95% of patients. These follow up screens are 10 minute appointments that you are not billed for.;     04/14/21: 3:50pm

## 2021-01-26 NOTE — Progress Notes (Signed)
Radiation Oncology         [phone] ________________________________  Name: Jennifer [name] MRN: [id number]  Date: 01/27/2021  DOB: 05-10-1945  Re-Evaluation Note  CC: [name], MD  Truitt Merle, MD    ICD-10-CM   1. 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female, estrogen receptor positive ([place])  C50.411    Z17.0       Diagnosis:   Stage IA (cT1c, cN0, cM0) Right Breast UOQ, Invasive Ductal Carcinoma, ER+ / PR+ / Her2-, Grade 2  Narrative:  The patient returns today to discuss radiation treatment options. She was seen for consultation on 12/23/20.   She opted to proceed with right breast lumpectomy and SNL biopsies on 12/27/20 under the care of Dr. Donne Hazel. Pathology from the procedure revealed: grade 2 invasive ductal carcinoma measuring 1.2 cm, with all resection margins negative for carcinoma. (Of note: right lateral margin excision also revealed mild fibrocystic changes with rare calcifications).  Nodal status of 2/2 right axillary sentinel lymph node excisions negative for carcinoma.  Prognostic indicators significant for: ER status >90% positive, PR status >90% positive, both with strong staining intensity; Her2 status negative; Ki67 not performed; Grade 2.   Oncotype DX was obtained on the final surgical sample and the recurrence score of 3 predicts a risk of recurrence outside the breast over the next 9 years of 3%, if the patient's only systemic therapy is an antiestrogen for 5 years.  It also predicts no significant benefit from chemotherapy.  The patient followed up with Dr. Donne Hazel on 01/22/20. During which time, the patient was noted to be doing well from a postoperative standpoint.   On review of systems, the patient reports minimal tenderness within the breast. She denies swelling in her right arm or hand and any other symptoms.    Allergies:  is allergic to advair diskus [fluticasone-salmeterol] and codeine.  Meds: Current Outpatient  Medications  Medication Sig Dispense Refill   citalopram (CELEXA) 40 MG tablet Take 1 tablet (40 mg total) by mouth daily. 90 tablet 0   clonazePAM (KLONOPIN) 0.5 MG tablet TAKE 1/2 TO 1 TABLET DAILY AS NEEDED. 30 tablet 1   Cyanocobalamin (B-12) 5000 MCG CAPS Take 5,000 Units by mouth daily. Metabolism support     hydrochlorothiazide (HYDRODIURIL) 25 MG tablet Take 1 tablet (25 mg total) by mouth daily. 90 tablet 0   Multiple Vitamin (MULTIVITAMIN WITH MINERALS) TABS tablet Take 1 tablet by mouth daily.     OXYGEN Place 2 L into the nose at bedtime. nasal cannula     oxymetazoline (AFRIN) 0.05 % nasal spray Place 1 spray into both nostrils daily. Congestion     pravastatin (PRAVACHOL) 20 MG tablet Take 1 tablet (20 mg total) by mouth daily. 90 tablet 0   Tiotropium Bromide-Olodaterol (STIOLTO RESPIMAT) 2.5-2.5 MCG/ACT AERS Inhale 1 puff into the lungs daily.     gabapentin (NEURONTIN) 300 MG capsule Take 300 mg by mouth daily. (Patient not taking: Reported on 01/27/2021)     traMADol (ULTRAM) 50 MG tablet Take 2 tablets (100 mg total) by mouth every 6 (six) hours as needed. (Patient not taking: Reported on 01/27/2021) 10 tablet 0   No current facility-administered medications for this encounter.    Physical Findings: The patient is in no acute distress. Patient is alert and oriented.  height is 5' 1.5" (1.562 m) and weight is 148 lb 9.6 oz (67.4 kg). Her temperature is 97.6 F (36.4 C). Her blood pressure is 121/51 (  abnormal) and her pulse is 71. Her respiration is 20 and oxygen saturation is 97%.  No significant changes. Lungs are clear to auscultation bilaterally. Heart has regular rate and rhythm. No palpable cervical, supraclavicular, or axillary adenopathy. Abdomen soft, non-tender, normal bowel sounds. Left breast: no palpable mass, nipple discharge or bleeding. Right breast: Well-healed periareolar scar in the upper outer quadrant.  A separate scar is noted in the axillary region which is  also healed well.  No dominant mass noted within the breast.  Some induration consistent with surgical effect along both scars.  No signs of infection within the breast.  Lab Findings: Lab Results  Component Value Date   WBC 10.0 12/19/2020   HGB 14.2 12/19/2020   HCT 44.2 12/19/2020   MCV 96.7 12/19/2020   PLT 231 12/19/2020    Radiographic Findings: No results found.  Impression:  Stage IA (cT1c, cN0, cM0) Right Breast UOQ, Invasive Ductal Carcinoma, ER+ / PR+ / Her2-, Grade 2  The patient has completed her planned surgery successfully.  Margins on her lumpectomy specimen could not be assessed however additional tissue along the posterior,  lateral and inferior margins showed no evidence of malignancy.  She would be a good candidate for adjuvant radiation therapy to reduce the chances of recurrence within the breast.  We discussed potential recurrence with and without radiation therapy assuming she does proceed with adjuvant hormonal therapy which she is planning.  She wishes to be aggressive with her postoperative management and does wish to proceed with adjuvant radiation therapy as well as adjuvant hormonal therapy.  I discussed the general course of radiation therapy anticipated side effects and potential long-term toxicities of right breast radiation therapy.  The patient appears to understand and wishes to proceed with planned course of treatment.  Plan:  Patient is scheduled for CT simulation later today.  Treatments began approximately a week.  Anticipate approximately 4 weeks of hypofractionated accelerated radiation therapy.  -----------------------------------  Blair Promise, PhD, MD  This document serves as a record of services personally performed by Gery Pray, MD. It was created on his behalf by Roney Mans, a trained medical scribe. The creation of this record is based on the scribe's personal observations and the provider's statements to them. This document has  been checked and approved by the attending provider.

## 2021-01-27 ENCOUNTER — Other Ambulatory Visit: Payer: Self-pay

## 2021-01-27 ENCOUNTER — Ambulatory Visit
Admission: RE | Admit: 2021-01-27 | Discharge: 2021-01-27 | Disposition: A | Discharge status: Home / Self Care | Payer: Medicare Other | Source: Ambulatory Visit | Attending: Radiation Oncology | Admitting: Radiation Oncology

## 2021-01-27 ENCOUNTER — Encounter: Payer: Self-pay | Admitting: Radiation Oncology

## 2021-01-27 VITALS — BP 121/51 | HR 71 | Temp 97.6°F | Resp 20 | Ht 61.5 in | Wt 148.6 lb

## 2021-01-27 DIAGNOSIS — Z510 Encounter for antineoplastic radiation therapy: Secondary | ICD-10-CM | POA: Diagnosis not present

## 2021-01-27 DIAGNOSIS — Z170 Estrogen receptor positive status [ER+]: Secondary | ICD-10-CM | POA: Diagnosis not present

## 2021-01-27 DIAGNOSIS — C50411 Malignant neoplasm of upper-outer quadrant of right female breast: Secondary | ICD-10-CM | POA: Insufficient documentation

## 2021-01-27 DIAGNOSIS — Z79899 Other long term (current) drug therapy: Secondary | ICD-10-CM | POA: Diagnosis not present

## 2021-01-27 NOTE — Progress Notes (Signed)
See MD note for nursing evaluation. °

## 2021-02-02 DIAGNOSIS — Z170 Estrogen receptor positive status [ER+]: Secondary | ICD-10-CM | POA: Diagnosis not present

## 2021-02-02 DIAGNOSIS — Z510 Encounter for antineoplastic radiation therapy: Secondary | ICD-10-CM | POA: Diagnosis not present

## 2021-02-02 DIAGNOSIS — C50411 Malignant neoplasm of upper-outer quadrant of right female breast: Secondary | ICD-10-CM | POA: Diagnosis not present

## 2021-02-03 ENCOUNTER — Ambulatory Visit
Admission: RE | Admit: 2021-02-03 | Discharge: 2021-02-03 | Disposition: A | Discharge status: Home / Self Care | Payer: Medicare Other | Source: Ambulatory Visit | Attending: Radiation Oncology | Admitting: Radiation Oncology

## 2021-02-03 ENCOUNTER — Encounter: Payer: Self-pay | Admitting: *Deleted

## 2021-02-03 ENCOUNTER — Other Ambulatory Visit: Payer: Self-pay

## 2021-02-03 DIAGNOSIS — Z510 Encounter for antineoplastic radiation therapy: Secondary | ICD-10-CM | POA: Diagnosis not present

## 2021-02-03 DIAGNOSIS — C50411 Malignant neoplasm of upper-outer quadrant of right female breast: Secondary | ICD-10-CM

## 2021-02-03 DIAGNOSIS — Z170 Estrogen receptor positive status [ER+]: Secondary | ICD-10-CM

## 2021-02-04 ENCOUNTER — Ambulatory Visit
Admission: RE | Admit: 2021-02-04 | Discharge: 2021-02-04 | Disposition: A | Discharge status: Home / Self Care | Payer: Medicare Other | Source: Ambulatory Visit | Attending: Radiation Oncology | Admitting: Radiation Oncology

## 2021-02-04 DIAGNOSIS — C50411 Malignant neoplasm of upper-outer quadrant of right female breast: Secondary | ICD-10-CM | POA: Diagnosis not present

## 2021-02-04 DIAGNOSIS — Z170 Estrogen receptor positive status [ER+]: Secondary | ICD-10-CM | POA: Diagnosis not present

## 2021-02-04 DIAGNOSIS — Z510 Encounter for antineoplastic radiation therapy: Secondary | ICD-10-CM | POA: Diagnosis not present

## 2021-02-04 MED ORDER — RADIAPLEXRX EX GEL: Freq: Once | CUTANEOUS | Status: AC

## 2021-02-04 MED ORDER — ALRA NON-METALLIC DEODORANT (RAD-ONC)
1.0000 "application " | Freq: Once | TOPICAL | Status: AC
  Administered 2021-02-04: 1 via TOPICAL

## 2021-02-04 NOTE — Telephone Encounter (Signed)
Error

## 2021-02-04 NOTE — Progress Notes (Signed)
Pt here for patient teaching.  Pt given Radiation and You booklet, skin care instructions, Alra deodorant, and Radiaplex gel.  Reviewed areas of pertinence such as fatigue, hair loss, skin changes, breast tenderness, and breast swelling . Pt able to give teach back of to pat skin,apply Radiaplex bid, avoid applying anything to skin within 4 hours of treatment, avoid wearing an under wire bra, and to use an electric razor if they must shave. Pt verbalizes understanding of information given and will contact nursing with any questions or concerns.     [email or web address]/treatmentinformation/whattoexpect/index      

## 2021-02-05 ENCOUNTER — Ambulatory Visit
Admission: RE | Admit: 2021-02-05 | Discharge: 2021-02-05 | Disposition: A | Discharge status: Home / Self Care | Payer: Medicare Other | Source: Ambulatory Visit | Attending: Radiation Oncology | Admitting: Radiation Oncology

## 2021-02-05 DIAGNOSIS — C50411 Malignant neoplasm of upper-outer quadrant of right female breast: Secondary | ICD-10-CM | POA: Diagnosis not present

## 2021-02-05 DIAGNOSIS — Z510 Encounter for antineoplastic radiation therapy: Secondary | ICD-10-CM | POA: Diagnosis not present

## 2021-02-05 DIAGNOSIS — Z170 Estrogen receptor positive status [ER+]: Secondary | ICD-10-CM | POA: Diagnosis not present

## 2021-02-06 ENCOUNTER — Ambulatory Visit
Admission: RE | Admit: 2021-02-06 | Discharge: 2021-02-06 | Disposition: A | Discharge status: Home / Self Care | Payer: Medicare Other | Source: Ambulatory Visit | Attending: Radiation Oncology | Admitting: Radiation Oncology

## 2021-02-06 ENCOUNTER — Other Ambulatory Visit: Payer: Self-pay

## 2021-02-06 DIAGNOSIS — Z170 Estrogen receptor positive status [ER+]: Secondary | ICD-10-CM | POA: Diagnosis not present

## 2021-02-06 DIAGNOSIS — Z510 Encounter for antineoplastic radiation therapy: Secondary | ICD-10-CM | POA: Diagnosis not present

## 2021-02-06 DIAGNOSIS — C50411 Malignant neoplasm of upper-outer quadrant of right female breast: Secondary | ICD-10-CM | POA: Diagnosis not present

## 2021-02-07 ENCOUNTER — Ambulatory Visit
Admission: RE | Admit: 2021-02-07 | Discharge: 2021-02-07 | Disposition: A | Discharge status: Home / Self Care | Payer: Medicare Other | Source: Ambulatory Visit | Attending: Radiation Oncology | Admitting: Radiation Oncology

## 2021-02-07 DIAGNOSIS — Z510 Encounter for antineoplastic radiation therapy: Secondary | ICD-10-CM | POA: Diagnosis not present

## 2021-02-07 DIAGNOSIS — Z170 Estrogen receptor positive status [ER+]: Secondary | ICD-10-CM | POA: Diagnosis not present

## 2021-02-07 DIAGNOSIS — C50411 Malignant neoplasm of upper-outer quadrant of right female breast: Secondary | ICD-10-CM | POA: Diagnosis not present

## 2021-02-10 ENCOUNTER — Ambulatory Visit
Admission: RE | Admit: 2021-02-10 | Discharge: 2021-02-10 | Disposition: A | Discharge status: Home / Self Care | Payer: Medicare Other | Source: Ambulatory Visit | Attending: Radiation Oncology | Admitting: Radiation Oncology

## 2021-02-10 ENCOUNTER — Other Ambulatory Visit: Payer: Self-pay

## 2021-02-10 DIAGNOSIS — C50411 Malignant neoplasm of upper-outer quadrant of right female breast: Secondary | ICD-10-CM | POA: Diagnosis not present

## 2021-02-10 DIAGNOSIS — Z170 Estrogen receptor positive status [ER+]: Secondary | ICD-10-CM | POA: Diagnosis not present

## 2021-02-10 DIAGNOSIS — Z510 Encounter for antineoplastic radiation therapy: Secondary | ICD-10-CM | POA: Diagnosis not present

## 2021-02-11 ENCOUNTER — Ambulatory Visit
Admission: RE | Admit: 2021-02-11 | Discharge: 2021-02-11 | Disposition: A | Discharge status: Home / Self Care | Payer: Medicare Other | Source: Ambulatory Visit | Attending: Radiation Oncology | Admitting: Radiation Oncology

## 2021-02-11 DIAGNOSIS — Z510 Encounter for antineoplastic radiation therapy: Secondary | ICD-10-CM | POA: Diagnosis not present

## 2021-02-11 DIAGNOSIS — C50411 Malignant neoplasm of upper-outer quadrant of right female breast: Secondary | ICD-10-CM | POA: Diagnosis not present

## 2021-02-11 DIAGNOSIS — Z170 Estrogen receptor positive status [ER+]: Secondary | ICD-10-CM | POA: Diagnosis not present

## 2021-02-12 ENCOUNTER — Ambulatory Visit
Admission: RE | Admit: 2021-02-12 | Discharge: 2021-02-12 | Disposition: A | Discharge status: Home / Self Care | Payer: Medicare Other | Source: Ambulatory Visit | Attending: Radiation Oncology | Admitting: Radiation Oncology

## 2021-02-12 ENCOUNTER — Other Ambulatory Visit: Payer: Self-pay

## 2021-02-12 DIAGNOSIS — Z170 Estrogen receptor positive status [ER+]: Secondary | ICD-10-CM | POA: Diagnosis not present

## 2021-02-12 DIAGNOSIS — C50411 Malignant neoplasm of upper-outer quadrant of right female breast: Secondary | ICD-10-CM | POA: Diagnosis not present

## 2021-02-12 DIAGNOSIS — Z510 Encounter for antineoplastic radiation therapy: Secondary | ICD-10-CM | POA: Diagnosis not present

## 2021-02-13 ENCOUNTER — Ambulatory Visit
Admission: RE | Admit: 2021-02-13 | Discharge: 2021-02-13 | Disposition: A | Discharge status: Home / Self Care | Payer: Medicare Other | Source: Ambulatory Visit | Attending: Radiation Oncology | Admitting: Radiation Oncology

## 2021-02-13 DIAGNOSIS — Z170 Estrogen receptor positive status [ER+]: Secondary | ICD-10-CM | POA: Diagnosis not present

## 2021-02-13 DIAGNOSIS — C50411 Malignant neoplasm of upper-outer quadrant of right female breast: Secondary | ICD-10-CM | POA: Diagnosis not present

## 2021-02-13 DIAGNOSIS — J449 Chronic obstructive pulmonary disease, unspecified: Secondary | ICD-10-CM | POA: Diagnosis not present

## 2021-02-13 DIAGNOSIS — Z510 Encounter for antineoplastic radiation therapy: Secondary | ICD-10-CM | POA: Diagnosis not present

## 2021-02-14 ENCOUNTER — Other Ambulatory Visit: Payer: Self-pay

## 2021-02-14 ENCOUNTER — Ambulatory Visit
Admission: RE | Admit: 2021-02-14 | Discharge: 2021-02-14 | Disposition: A | Discharge status: Home / Self Care | Payer: Medicare Other | Source: Ambulatory Visit | Attending: Radiation Oncology | Admitting: Radiation Oncology

## 2021-02-14 DIAGNOSIS — Z170 Estrogen receptor positive status [ER+]: Secondary | ICD-10-CM | POA: Diagnosis not present

## 2021-02-14 DIAGNOSIS — Z510 Encounter for antineoplastic radiation therapy: Secondary | ICD-10-CM | POA: Diagnosis not present

## 2021-02-14 DIAGNOSIS — C50411 Malignant neoplasm of upper-outer quadrant of right female breast: Secondary | ICD-10-CM | POA: Diagnosis not present

## 2021-02-17 ENCOUNTER — Ambulatory Visit
Admission: RE | Admit: 2021-02-17 | Discharge: 2021-02-17 | Disposition: A | Discharge status: Home / Self Care | Payer: Medicare Other | Source: Ambulatory Visit | Attending: Radiation Oncology | Admitting: Radiation Oncology

## 2021-02-17 ENCOUNTER — Other Ambulatory Visit: Payer: Self-pay

## 2021-02-17 DIAGNOSIS — C50411 Malignant neoplasm of upper-outer quadrant of right female breast: Secondary | ICD-10-CM | POA: Diagnosis not present

## 2021-02-17 DIAGNOSIS — Z170 Estrogen receptor positive status [ER+]: Secondary | ICD-10-CM | POA: Diagnosis not present

## 2021-02-17 DIAGNOSIS — Z510 Encounter for antineoplastic radiation therapy: Secondary | ICD-10-CM | POA: Diagnosis not present

## 2021-02-18 ENCOUNTER — Telehealth: Payer: Self-pay | Admitting: Internal Medicine

## 2021-02-18 ENCOUNTER — Ambulatory Visit
Admission: RE | Admit: 2021-02-18 | Discharge: 2021-02-18 | Disposition: A | Discharge status: Home / Self Care | Payer: Medicare Other | Source: Ambulatory Visit | Attending: Radiation Oncology | Admitting: Radiation Oncology

## 2021-02-18 ENCOUNTER — Other Ambulatory Visit: Payer: Self-pay

## 2021-02-18 ENCOUNTER — Ambulatory Visit: Payer: Medicare Other | Admitting: Radiation Oncology

## 2021-02-18 DIAGNOSIS — Z510 Encounter for antineoplastic radiation therapy: Secondary | ICD-10-CM | POA: Diagnosis not present

## 2021-02-18 DIAGNOSIS — C50411 Malignant neoplasm of upper-outer quadrant of right female breast: Secondary | ICD-10-CM | POA: Diagnosis not present

## 2021-02-18 DIAGNOSIS — Z170 Estrogen receptor positive status [ER+]: Secondary | ICD-10-CM | POA: Diagnosis not present

## 2021-02-18 MED ORDER — PRAVASTATIN SODIUM 20 MG PO TABS: 20.0000 mg | ORAL_TABLET | Freq: Every day | ORAL | 0 refills | Status: DC

## 2021-02-18 MED ORDER — HYDROCHLOROTHIAZIDE 25 MG PO TABS: 25.0000 mg | ORAL_TABLET | Freq: Every day | ORAL | 0 refills | Status: DC

## 2021-02-18 NOTE — Telephone Encounter (Signed)
Patient called in regards to medication refill. Patient states she is aware she needs to schedule CPE with provider however patient has two more weeks of treatment and would rather not be around people until her treatment is completed. Patient is needing the following refilled:   hydrochlorothiazide (HYDRODIURIL) 25 MG tablet pravastatin (PRAVACHOL) 20 MG tablet   Patient uses [facility] in [place]

## 2021-02-18 NOTE — Telephone Encounter (Signed)
Medication sent in. Pt aware.

## 2021-02-19 ENCOUNTER — Other Ambulatory Visit: Payer: Self-pay

## 2021-02-19 ENCOUNTER — Ambulatory Visit
Admission: RE | Admit: 2021-02-19 | Discharge: 2021-02-19 | Disposition: A | Discharge status: Home / Self Care | Payer: Medicare Other | Source: Ambulatory Visit | Attending: Radiation Oncology | Admitting: Radiation Oncology

## 2021-02-19 DIAGNOSIS — Z510 Encounter for antineoplastic radiation therapy: Secondary | ICD-10-CM | POA: Insufficient documentation

## 2021-02-19 DIAGNOSIS — C50411 Malignant neoplasm of upper-outer quadrant of right female breast: Secondary | ICD-10-CM | POA: Insufficient documentation

## 2021-02-19 DIAGNOSIS — Z170 Estrogen receptor positive status [ER+]: Secondary | ICD-10-CM | POA: Insufficient documentation

## 2021-02-20 ENCOUNTER — Ambulatory Visit
Admission: RE | Admit: 2021-02-20 | Discharge: 2021-02-20 | Disposition: A | Discharge status: Home / Self Care | Payer: Medicare Other | Source: Ambulatory Visit | Attending: Radiation Oncology | Admitting: Radiation Oncology

## 2021-02-20 DIAGNOSIS — Z510 Encounter for antineoplastic radiation therapy: Secondary | ICD-10-CM | POA: Diagnosis not present

## 2021-02-20 DIAGNOSIS — C50411 Malignant neoplasm of upper-outer quadrant of right female breast: Secondary | ICD-10-CM | POA: Diagnosis not present

## 2021-02-20 DIAGNOSIS — Z170 Estrogen receptor positive status [ER+]: Secondary | ICD-10-CM | POA: Diagnosis not present

## 2021-02-21 ENCOUNTER — Ambulatory Visit
Admission: RE | Admit: 2021-02-21 | Discharge: 2021-02-21 | Disposition: A | Discharge status: Home / Self Care | Payer: Medicare Other | Source: Ambulatory Visit | Attending: Radiation Oncology | Admitting: Radiation Oncology

## 2021-02-21 ENCOUNTER — Other Ambulatory Visit: Payer: Self-pay

## 2021-02-21 DIAGNOSIS — Z170 Estrogen receptor positive status [ER+]: Secondary | ICD-10-CM | POA: Diagnosis not present

## 2021-02-21 DIAGNOSIS — Z510 Encounter for antineoplastic radiation therapy: Secondary | ICD-10-CM | POA: Diagnosis not present

## 2021-02-21 DIAGNOSIS — C50411 Malignant neoplasm of upper-outer quadrant of right female breast: Secondary | ICD-10-CM | POA: Diagnosis not present

## 2021-02-24 ENCOUNTER — Ambulatory Visit
Admission: RE | Admit: 2021-02-24 | Discharge: 2021-02-24 | Disposition: A | Discharge status: Home / Self Care | Payer: Medicare Other | Source: Ambulatory Visit | Attending: Radiation Oncology | Admitting: Radiation Oncology

## 2021-02-24 ENCOUNTER — Other Ambulatory Visit: Payer: Self-pay

## 2021-02-24 ENCOUNTER — Telehealth: Payer: Self-pay | Admitting: Hematology

## 2021-02-24 DIAGNOSIS — Z510 Encounter for antineoplastic radiation therapy: Secondary | ICD-10-CM | POA: Diagnosis not present

## 2021-02-24 DIAGNOSIS — Z170 Estrogen receptor positive status [ER+]: Secondary | ICD-10-CM | POA: Diagnosis not present

## 2021-02-24 DIAGNOSIS — C50411 Malignant neoplasm of upper-outer quadrant of right female breast: Secondary | ICD-10-CM | POA: Diagnosis not present

## 2021-02-24 NOTE — Telephone Encounter (Signed)
Sch per 2/6 inbasket, pt aware

## 2021-02-25 ENCOUNTER — Encounter: Payer: Self-pay | Admitting: Hematology

## 2021-02-25 ENCOUNTER — Inpatient Hospital Stay (HOSPITAL_BASED_OUTPATIENT_CLINIC_OR_DEPARTMENT_OTHER): Payer: Medicare Other | Admitting: Hematology

## 2021-02-25 ENCOUNTER — Ambulatory Visit
Admission: RE | Admit: 2021-02-25 | Discharge: 2021-02-25 | Disposition: A | Discharge status: Home / Self Care | Payer: Medicare Other | Source: Ambulatory Visit | Attending: Radiation Oncology | Admitting: Radiation Oncology

## 2021-02-25 VITALS — BP 127/72 | HR 75 | Temp 98.2°F | Resp 18 | Ht 61.5 in | Wt 150.3 lb

## 2021-02-25 DIAGNOSIS — C50411 Malignant neoplasm of upper-outer quadrant of right female breast: Secondary | ICD-10-CM

## 2021-02-25 DIAGNOSIS — Z510 Encounter for antineoplastic radiation therapy: Secondary | ICD-10-CM | POA: Diagnosis not present

## 2021-02-25 DIAGNOSIS — Z79810 Long term (current) use of selective estrogen receptor modulators (SERMs): Secondary | ICD-10-CM | POA: Insufficient documentation

## 2021-02-25 DIAGNOSIS — M8580 Other specified disorders of bone density and structure, unspecified site: Secondary | ICD-10-CM | POA: Insufficient documentation

## 2021-02-25 DIAGNOSIS — Z79899 Other long term (current) drug therapy: Secondary | ICD-10-CM | POA: Insufficient documentation

## 2021-02-25 DIAGNOSIS — Z170 Estrogen receptor positive status [ER+]: Secondary | ICD-10-CM | POA: Insufficient documentation

## 2021-02-25 MED ORDER — TAMOXIFEN CITRATE 20 MG PO TABS: 20.0000 mg | ORAL_TABLET | Freq: Every day | ORAL | 3 refills | Status: DC

## 2021-02-25 NOTE — Progress Notes (Signed)
Jennifer [name]   [phone] [phone]   Clinic Follow up Note   Patient Care Team: [name], MD as PCP - General (Internal Medicine) [name], RN as Oncology Nurse Navigator [name], RN as Oncology Nurse Navigator 02/25/2021  CHIEF COMPLAINT: f/u right breast cancer   ASSESSMENT & PLAN:  Jennifer [name] is a 76 y.o. postmenopausal female with a history of HTN, depression    1. Malignant neoplasm of upper-outer quadrant of right breast, Stage IA, p(T1c, N0), ER+/PR+/HER2-, Grade 2, Oncotype RS 3 -found on screening mammogram. Right diagnostic MM and [place] on 11/04/20 showed 1.2 cm mass at 11 o'clock. Biopsy on 11/14/20 confirmed invasive mammary carcinoma. -She underwent right lumpectomy on 12/27/20  -I discussed her surgical path result in details -the Oncotype Dx result was reviewed with her in details. She has low reurrence score (3), adjuvant chemotherapy is not recommended. -She is finishing adjuvant radiation. -Giving the strong ER and PR expression in her tumor and her postmenopausal status, I recommend adjuvant endocrine therapy with tamoxifen for 5-10 years.  She may not tolerate AI well due to her arthritis. ---The potential side effects, which includes but not limited to, hot flash, skin and vaginal dryness, slightly increased risk of cardiovascular disease and cataract, small risk of thrombosis and endometrial cancer, were discussed with her in great details. Preventive strategies for thrombosis, such as being physically active, using compression stocks, avoid cigarette smoking, etc., were reviewed with her.  She has had hysterectomy, no risk for endometrial cancer from tamoxifen she voiced good understanding, and agrees to proceed. Will start after she completes adjuvant breast radiation. -We also discussed the breast cancer surveillance after her surgery. She will continue annual screening mammogram, self exam, and a routine  office visit with lab and exam with [place]. -I encouraged her to have healthy diet and exercise regularly.    2. Osteopenia -Her most recent DEXA was 08/17/16 showing osteopenia (T-score -1.1). I recommend obtaining repeat. -I encouraged her to get a repeated DEXA scan -We discussed that tamoxifen will likely improve her bone density.   3. HTN, depression, arthritis  -continue medication    PLAN:  -She will complete adjuvant radiation later this week -I called in tamoxifen 20 mg daily, to start the first week of March -Survivorship with [name] or [name] in 3 months  -Lab and f/u in 6 months   SUMMARY OF ONCOLOGIC HISTORY: Oncology History Overview Note   Cancer Staging  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female, estrogen receptor positive ([place]) Staging form: Breast, AJCC 8th Edition - Clinical stage from 11/14/2020: Stage IA (cT1c, cN0, cM0, G2, ER+, PR+, HER2-) - Signed by Truitt Merle, MD on 12/17/2020 Stage prefix: Initial diagnosis Histologic grading system: 3 grade system - Pathologic stage from 12/27/2020: Stage IA (pT1c, pN0, cM0, G2, ER+, PR+, HER2-) - Signed by Truitt Merle, MD on 02/25/2021 Stage prefix: Initial diagnosis Histologic grading system: 3 grade system Residual tumor (R): R0 - None     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female, estrogen receptor positive ([place])  11/04/2020 Mammogram   EXAM: DIGITAL DIAGNOSTIC UNILATERAL RIGHT MAMMOGRAM WITH TOMOSYNTHESIS AND CAD; ULTRASOUND RIGHT BREAST LIMITED  FINDINGS: Targeted ultrasound is performed, showing irregular hypoechoic mass at the right breast 11 o'clock 5 cm from nipple measuring 1 x 0.5 x 1.2 cm correlating to the mammographic mass. Ultrasound of the right axilla is negative.   11/14/2020 Cancer Staging  Staging form: Breast, AJCC 8th Edition - Clinical stage from 11/14/2020: Stage IA (cT1c, cN0, cM0, G2, ER+, PR+, HER2-) - Signed by Truitt Merle, MD on  12/17/2020 Stage prefix: Initial diagnosis Histologic grading system: 3 grade system    11/14/2020 Pathology Results   DIAGNOSIS:  A. BREAST, RIGHT AT 11:00, 5 CM FROM THE NIPPLE; ULTRASOUND-GUIDED CORE NEEDLE BIOPSY:  - INVASIVE MAMMARY CARCINOMA, NO SPECIAL TYPE.  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female, estrogen receptor positive ([place])   12/27/2020 Cancer Staging   Staging form: Breast, AJCC 8th Edition - Pathologic stage from 12/27/2020: Stage IA (pT1c, pN0, cM0, G2, ER+, PR+, HER2-) - Signed by Truitt Merle, MD on 02/25/2021 Stage prefix: Initial diagnosis Histologic grading system: 3 grade system Residual tumor (R): R0 - None      CURRENT THERAPY:   INTERVAL HISTORY: Jennifer [name] returns for follow-up.  She is on adjuvant radiation, to be completed later this week. She is tolerating radiation well, mild fatigue and skin redness  Mild tingling at incision site, no pain or other complaints.  She has spinal scoliosis and mild arthralgia, she use topic for join pain   All other systems were reviewed with the patient and are negative.  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   Gastritis    GERD (gastroesophageal reflux disease)     Hypertension    PONV (postoperative nausea and vomiting)    Scoliosis    Ulcer     SURGICAL HISTORY: Past Surgical History:  Procedure Laterality Date   ABDOMINAL HYSTERECTOMY     ANTERIOR CERVICAL DECOMP/DISCECTOMY FUSION N/A 04/19/2017   Procedure: Anterior Cervical Decompression Fusion Cervical Three-Four, Cervical Four-Five, Cervical Five-Six;  Surgeon: Kary Kos, MD;  Location: [facility];  Service: Neurosurgery;  Laterality: N/A;  Anterior Cervical Decompression Fusion Cervical Three-Four, Cervical Four-Five, Cervical Five-Six   BREAST BIOPSY Right 11/14/2020   [place] Bx, Venus Clip, Path Pending   BREAST LUMPECTOMY WITH RADIOACTIVE SEED AND SENTINEL LYMPH NODE BIOPSY Right 12/27/2020   Procedure: RIGHT BREAST LUMPECTOMY WITH RADIOACTIVE SEED AND AXILLARY SENTINEL LYMPH NODE BIOPSY;  Surgeon: Rolm Bookbinder, MD;  Location: [facility];  Service: General;  Laterality: Right;   [place]   LAPAROSCOPIC REMOVAL OF MESENTERIC MASS     LUMBAR [place]   PARTIAL HYSTERECTOMY  1981   prolapsed uterus   TUBAL LIGATION  1978    I have reviewed the social history and family history with the patient and  they are unchanged from previous note.  ALLERGIES:  is allergic to advair diskus [fluticasone-salmeterol] and codeine.  MEDICATIONS:  Current Outpatient Medications  Medication Sig Dispense Refill   tamoxifen (NOLVADEX) 20 MG tablet Take 1 tablet (20 mg total) by mouth daily. 30 tablet 3   citalopram (CELEXA) 40 MG tablet Take 1 tablet (40 mg total) by mouth daily. 90 tablet 0   clonazePAM (KLONOPIN) 0.5 MG tablet TAKE 1/2 TO 1 TABLET DAILY AS NEEDED. 30 tablet 1   Cyanocobalamin (B-12) 5000 MCG CAPS Take 5,000 Units by mouth daily. Metabolism support     gabapentin (NEURONTIN) 300 MG capsule Take 300 mg by mouth daily. (Patient not taking: Reported on 01/27/2021)     hydrochlorothiazide (HYDRODIURIL) 25 MG tablet Take 1 tablet  (25 mg total) by mouth daily. 90 tablet 0   Multiple Vitamin (MULTIVITAMIN WITH MINERALS) TABS tablet Take 1 tablet by mouth daily.     OXYGEN Place 2 L into the nose at bedtime. nasal cannula     oxymetazoline (AFRIN) 0.05 % nasal spray Place 1 spray into both nostrils daily. Congestion     pravastatin (PRAVACHOL) 20 MG tablet Take 1 tablet (20 mg total) by mouth daily. 90 tablet 0   Tiotropium Bromide-Olodaterol (STIOLTO RESPIMAT) 2.5-2.5 MCG/ACT AERS Inhale 1 puff into the lungs daily.     traMADol (ULTRAM) 50 MG tablet Take 2 tablets (100 mg total) by mouth every 6 (six) hours as needed. (Patient not taking: Reported on 01/27/2021) 10 tablet 0   No current facility-administered medications for this visit.    PHYSICAL EXAMINATION: ECOG PERFORMANCE STATUS: 1 - Symptomatic but completely ambulatory  Vitals:   02/25/21 1030  BP: 127/72  Pulse: 75  Resp: 18  Temp: 98.2 F (36.8 C)  SpO2: 95%   Filed Weights   02/25/21 1030  Weight: 150 lb 4.8 oz (68.2 kg)    GENERAL:alert, no distress and comfortable SKIN: skin color, texture, turgor are normal, no rashes or significant lesions EYES: normal, Conjunctiva are pink and non-injected, sclera clear Musculoskeletal:no cyanosis of digits and no clubbing  NEURO: alert & oriented x 3 with fluent speech, no focal motor/sensory deficits BREAST: Diffuse skin erythema in the right breast  LABORATORY DATA:  I have reviewed the data as listed CBC Latest Ref Rng & Units 12/19/2020 08/13/2020 03/02/2019  WBC 4.0 - 10.5 K/uL 10.0 8.3 6.9  Hemoglobin 12.0 - 15.0 g/dL 14.2 14.3 14.1  Hematocrit 36.0 - 46.0 % 44.2 42.9 41.9  Platelets 150 - 400 K/uL 231 263.0 211.0     CMP Latest Ref Rng & Units 12/19/2020 08/13/2020 12/19/2019  Glucose 70 - 99 mg/dL 98 92 93  BUN 8 - 23 mg/dL _0 Creatinine 0.44 - 1.00 mg/dL 0.60 0.57 0.60  Sodium 135 - 145 mmol/L 139 139 142  Potassium 3.5 - 5.1 mmol/L 3.4(L) 3.7 3.4(L)  Chloride 98 - 111 mmol/L 97(L) 97 99   CO2 22 - 32 mmol/L 35(H) 35(H) 39(H)  Calcium 8.9 - 10.3 mg/dL 9.5 9.4 9.4  Total Protein 6.0 - 8.3 g/dL - 6.5 6.2  Total Bilirubin 0.2 - 1.2 mg/dL - 0.6 0.7  Alkaline Phos 39 - 117 U/L - 56 55  AST 0 - 37 U/L - 23 20  ALT 0 - 35 U/L - 14 15      RADIOGRAPHIC STUDIES: I have personally reviewed the radiological images as listed and agreed with the findings in the report. No results found.  No orders of the defined types were placed in this encounter.  All questions were answered. The patient knows to call the clinic with any problems, questions or concerns. No barriers to learning was detected. I spent 25 minutes counseling the patient face to face. The total time spent in the appointment was 30 minutes and more than 50% was on counseling and review of test results     Truitt Merle, MD 02/25/21

## 2021-02-26 ENCOUNTER — Ambulatory Visit
Admission: RE | Admit: 2021-02-26 | Discharge: 2021-02-26 | Disposition: A | Discharge status: Home / Self Care | Payer: Medicare Other | Source: Ambulatory Visit | Attending: Radiation Oncology | Admitting: Radiation Oncology

## 2021-02-26 ENCOUNTER — Other Ambulatory Visit: Payer: Self-pay

## 2021-02-26 DIAGNOSIS — Z170 Estrogen receptor positive status [ER+]: Secondary | ICD-10-CM | POA: Diagnosis not present

## 2021-02-26 DIAGNOSIS — Z510 Encounter for antineoplastic radiation therapy: Secondary | ICD-10-CM | POA: Diagnosis not present

## 2021-02-26 DIAGNOSIS — C50411 Malignant neoplasm of upper-outer quadrant of right female breast: Secondary | ICD-10-CM | POA: Diagnosis not present

## 2021-02-27 ENCOUNTER — Ambulatory Visit: Payer: Medicare Other | Admitting: Hematology

## 2021-02-27 ENCOUNTER — Ambulatory Visit
Admission: RE | Admit: 2021-02-27 | Discharge: 2021-02-27 | Disposition: A | Discharge status: Home / Self Care | Payer: Medicare Other | Source: Ambulatory Visit | Attending: Radiation Oncology | Admitting: Radiation Oncology

## 2021-02-27 ENCOUNTER — Encounter: Payer: Self-pay | Admitting: *Deleted

## 2021-02-27 DIAGNOSIS — C50411 Malignant neoplasm of upper-outer quadrant of right female breast: Secondary | ICD-10-CM | POA: Diagnosis not present

## 2021-02-27 DIAGNOSIS — Z510 Encounter for antineoplastic radiation therapy: Secondary | ICD-10-CM | POA: Diagnosis not present

## 2021-02-27 DIAGNOSIS — Z170 Estrogen receptor positive status [ER+]: Secondary | ICD-10-CM | POA: Diagnosis not present

## 2021-02-28 ENCOUNTER — Other Ambulatory Visit: Payer: Self-pay

## 2021-02-28 ENCOUNTER — Encounter: Payer: Self-pay | Admitting: Radiation Oncology

## 2021-02-28 ENCOUNTER — Ambulatory Visit
Admission: RE | Admit: 2021-02-28 | Discharge: 2021-02-28 | Disposition: A | Discharge status: Home / Self Care | Payer: Medicare Other | Source: Ambulatory Visit | Attending: Radiation Oncology | Admitting: Radiation Oncology

## 2021-02-28 DIAGNOSIS — Z170 Estrogen receptor positive status [ER+]: Secondary | ICD-10-CM | POA: Diagnosis not present

## 2021-02-28 DIAGNOSIS — C50411 Malignant neoplasm of upper-outer quadrant of right female breast: Secondary | ICD-10-CM | POA: Diagnosis not present

## 2021-02-28 DIAGNOSIS — Z510 Encounter for antineoplastic radiation therapy: Secondary | ICD-10-CM | POA: Diagnosis not present

## 2021-03-10 ENCOUNTER — Other Ambulatory Visit: Payer: Self-pay | Admitting: Internal Medicine

## 2021-03-16 DIAGNOSIS — J449 Chronic obstructive pulmonary disease, unspecified: Secondary | ICD-10-CM | POA: Diagnosis not present

## 2021-03-25 ENCOUNTER — Encounter: Payer: Self-pay | Admitting: Radiation Oncology

## 2021-03-26 ENCOUNTER — Encounter (HOSPITAL_COMMUNITY): Payer: Self-pay

## 2021-03-26 ENCOUNTER — Other Ambulatory Visit: Payer: Self-pay | Admitting: Internal Medicine

## 2021-03-26 MED ORDER — CLONAZEPAM 0.5 MG PO TABS: ORAL_TABLET | ORAL | 1 refills | Status: DC

## 2021-03-26 NOTE — Telephone Encounter (Signed)
Last OV 08/15/2020 ?Next OV 06/03/2021 ?Last refill 12/18/2020 ? ?

## 2021-03-26 NOTE — Telephone Encounter (Signed)
Pt need refill on clonazePAM sent to [facility] ?

## 2021-03-30 NOTE — Progress Notes (Signed)
?Radiation Oncology         [phone] ?________________________________ ? ?Name: Jennifer [name] MRN: [id number]  ?Date: 03/31/2021  DOB: [birth year] ? ?Follow-Up Visit Note ? ?CC: [name], MD  Truitt Merle, MD ? ?  ICD-10-CM   ?1. 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female, estrogen receptor positive ([place])  C50.411   ? Z17.0   ?  ? ? ?Diagnosis:   Stage IA (cT1c, cN0, cM0) Right Breast UOQ, Invasive Ductal Carcinoma, ER+ / PR+ / Her2-, Grade 2 ? ?Interval Since Last Radiation: 1 month and 3 days   ? ?Intent: Curative ? ?Radiation Treatment Dates: 02/03/2021 through 02/28/2021 ?Site Technique Total Dose (Gy) Dose per Fx (Gy) Completed Fx Beam Energies  ?Breast, Right: Breast_R 3D 40.05/40.05 2.67 15/15 6XFFF  ?Breast, Right: Breast_R_Bst 3D 10/10 2 5/5 6X  ? ? ?Narrative:  The patient returns today for routine follow-up. The patient tolerated radiation therapy relatively well. During her final weekly treatment check on 02/25/21, the patient reported an itchy rash on the right breast. Physical exam performed revealed significant radiation dermatitis in the upper inner aspect of the right breast.  No skin breakdown was appreciated or signs of infection.  This effected area was not treated with her boost field.  In regards to her itching, the patient used hydrocortisone cream and radioPlex with decent relief.          ? ?Since she was last seen for re-evaluation in January, the patient followed up with Dr. Burr Medico on 02/25/21 to discuss AI treatment options. During this visit, the patient reported mild fatigue and skin redness from RT. She also endorsed mild tingling at her lumpectomy incision site. Otherwise, she denied any pain or other symptoms. Given the strong ER and PR expression in her tumor and her postmenopausal status, Dr. Burr Medico recommend adjuvant endocrine therapy with tamoxifen for 5-10 years. (Dr. Burr Medico noted that the patient may not tolerate AI well due to her arthritis).         ? ?Otherwise, no significant interval history since the patient completed RT.  ? ?She has started tamoxifen and has noted some mild dizziness with this medication.  have asked her to call Dr. Burr Medico if this persists.             ? ?Allergies:  is allergic to advair diskus [fluticasone-salmeterol] and codeine. ? ?Meds: ?Current Outpatient Medications  ?Medication Sig Dispense Refill  ? citalopram (CELEXA) 40 MG tablet Take 1 tablet (40 mg total) by mouth daily. 30 tablet 1  ? clonazePAM (KLONOPIN) 0.5 MG tablet TAKE 1/2 TO 1 TABLET BY MOUTH DAILY AS NEEDED. 30 tablet 1  ? Cyanocobalamin (B-12) 5000 MCG CAPS Take 5,000 Units by mouth daily. Metabolism support    ? hydrochlorothiazide (HYDRODIURIL) 25 MG tablet Take 1 tablet (25 mg total) by mouth daily. 90 tablet 0  ? Multiple Vitamin (MULTIVITAMIN WITH MINERALS) TABS tablet Take 1 tablet by mouth daily.    ? OXYGEN Place 2 L into the nose at bedtime. nasal cannula    ? pravastatin (PRAVACHOL) 20 MG tablet Take 1 tablet (20 mg total) by mouth daily. 90 tablet 0  ? tamoxifen (NOLVADEX) 20 MG tablet Take 1 tablet (20 mg total) by mouth daily. 30 tablet 3  ? Tiotropium Bromide-Olodaterol (STIOLTO RESPIMAT) 2.5-2.5 MCG/ACT AERS Inhale 1 puff into the lungs daily.    ? traMADol (ULTRAM) 50 MG tablet Take 2 tablets (100 mg total) by mouth every 6 (six) hours  as needed. 10 tablet 0  ? gabapentin (NEURONTIN) 300 MG capsule Take 300 mg by mouth daily. (Patient not taking: Reported on 03/31/2021)    ? oxymetazoline (AFRIN) 0.05 % nasal spray Place 1 spray into both nostrils daily. Congestion (Patient not taking: Reported on 03/31/2021)    ? ?No current facility-administered medications for this encounter.  ? ? ?Physical Findings: ?The patient is in no acute distress. Patient is alert and oriented. ? height is 5' 1.5" (1.562 m) and weight is 145 lb 12.8 oz (66.1 kg). Her temperature is 97.6 ?F (36.4 ?C). Her blood pressure is 120/58 (abnormal) and her pulse is 72. Her respiration  is 20 and oxygen saturation is 99%. .   Lungs are clear to auscultation bilaterally. Heart has regular rate and rhythm. No palpable cervical, supraclavicular, or axillary adenopathy. Abdomen soft, non-tender, normal bowel sounds. ? ?Left Breast: no palpable mass, nipple discharge or bleeding. ?Right Breast: Skin is healed well.  Some residual mild edema in the breast and hyperpigmentation changes.  No dominant mass appreciated in the breast nipple discharge or bleeding. ? ?Lab Findings: ?Lab Results  ?Component Value Date  ? WBC 10.0 12/19/2020  ? HGB 14.2 12/19/2020  ? HCT 44.2 12/19/2020  ? MCV 96.7 12/19/2020  ? PLT 231 12/19/2020  ? ? ?Radiographic Findings: ?No results found. ? ?Impression:  Stage IA (cT1c, cN0, cM0) Right Breast UOQ, Invasive Ductal Carcinoma, ER+ / PR+ / Her2-, Grade 2 ? ?The patient is recovering well from the effects of radiation.  No evidence of recurrence on clinical exam today ? ?Plan: As needed follow-up in radiation oncology.  She will continue close follow-up with medical oncology and continue on tamoxifen. ? ? ? ?____________________________________ ? ?Blair Promise, PhD, MD ? ? ?This document serves as a record of services personally performed by Gery Pray, MD. It was created on his behalf by Roney Mans, a trained medical scribe. The creation of this record is based on the scribe's personal observations and the provider's statements to them. This document has been checked and approved by the attending provider. ? ?

## 2021-03-30 NOTE — Progress Notes (Incomplete)
Radiation Oncology         [phone] ________________________________  Patient Name: Jennifer [name] MRN: [id number] DOB: Oct 12, 1945 Referring Physician: Truitt Merle (Profile Not Attached) Date of Service: 02/28/2021 [facility], [place]                                                        End Of Treatment Note  Diagnoses: C50.411-Malignant neoplasm of upper-outer quadrant of right female breast Z17.0-Estrogen receptor positive status [ER+]  Cancer Staging: Stage IA (cT1c, cN0, cM0) Right Breast UOQ, Invasive Ductal Carcinoma, ER+ / PR+ / Her2-, Grade 2  Intent: Curative  Radiation Treatment Dates: 02/03/2021 through 02/28/2021 Site Technique Total Dose (Gy) Dose per Fx (Gy) Completed Fx Beam Energies  Breast, Right: Breast_R 3D 40.05/40.05 2.67 15/15 6XFFF  Breast, Right: Breast_R_Bst 3D 10/10 2 5/5 6X   Narrative: The patient tolerated radiation therapy relatively well. During her final weekly treatment check on 02/25/21, the patient reported an itchy rash on the right breast. Physical exam performed revealed significant radiation dermatitis in the upper inner aspect of the right breast.  No skin breakdown was appreciated or signs of infection.  This effected area was not treated with her boost field.  In regards to her itching, the patient used hydrocortisone cream and radioPlex with some relief.   Plan: The patient will follow-up with radiation oncology in one month .  ________________________________________________ -----------------------------------  Blair Promise, PhD, MD  This document serves as a record of services personally performed by Gery Pray, MD. It was created on his behalf by Roney Mans, a trained medical scribe. The creation of this record is based on the scribe's personal observations and the provider's statements to them. This document has been checked and approved by the attending provider.

## 2021-03-31 ENCOUNTER — Encounter: Payer: Self-pay | Admitting: Radiation Oncology

## 2021-03-31 ENCOUNTER — Other Ambulatory Visit: Payer: Self-pay

## 2021-03-31 ENCOUNTER — Ambulatory Visit
Admission: RE | Admit: 2021-03-31 | Discharge: 2021-03-31 | Disposition: A | Discharge status: Home / Self Care | Payer: Medicare Other | Source: Ambulatory Visit | Attending: Radiation Oncology | Admitting: Radiation Oncology

## 2021-03-31 DIAGNOSIS — R5383 Other fatigue: Secondary | ICD-10-CM | POA: Diagnosis not present

## 2021-03-31 DIAGNOSIS — Z79810 Long term (current) use of selective estrogen receptor modulators (SERMs): Secondary | ICD-10-CM | POA: Insufficient documentation

## 2021-03-31 DIAGNOSIS — Z170 Estrogen receptor positive status [ER+]: Secondary | ICD-10-CM | POA: Insufficient documentation

## 2021-03-31 DIAGNOSIS — R202 Paresthesia of skin: Secondary | ICD-10-CM | POA: Diagnosis not present

## 2021-03-31 DIAGNOSIS — M1990 Unspecified osteoarthritis, unspecified site: Secondary | ICD-10-CM | POA: Diagnosis not present

## 2021-03-31 DIAGNOSIS — Z923 Personal history of irradiation: Secondary | ICD-10-CM | POA: Diagnosis not present

## 2021-03-31 DIAGNOSIS — C50411 Malignant neoplasm of upper-outer quadrant of right female breast: Secondary | ICD-10-CM | POA: Diagnosis not present

## 2021-03-31 DIAGNOSIS — Z79899 Other long term (current) drug therapy: Secondary | ICD-10-CM | POA: Insufficient documentation

## 2021-03-31 DIAGNOSIS — R42 Dizziness and giddiness: Secondary | ICD-10-CM | POA: Insufficient documentation

## 2021-03-31 HISTORY — DX: Personal history of irradiation: Z92.3

## 2021-03-31 NOTE — Progress Notes (Signed)
Jennifer [name] is here today for follow up post radiation to the breast. ? ? Breast Side:Right ? ? ?They completed their radiation on: 02/28/21 ? ?Does the patient complain of any of the following: ?Post radiation skin issues: Patient reports skin has improved.  ?Breast Tenderness: yes, near surgical incision. ?Breast Swelling: no ?Lymphadema: no ?Range of Motion limitations: no ?Fatigue post radiation: Patient reports improvement in energy level.  ?Appetite good/fair/poor: Good ? ?Additional comments if applicable:  ? ?Vitals:  ? 03/31/21 1009  ?BP: (!) 120/58  ?Pulse: 72  ?Resp: 20  ?Temp: 97.6 ?F (36.4 ?C)  ?SpO2: 99%  ?Weight: 145 lb 12.8 oz (66.1 kg)  ?Height: 5' 1.5" (1.562 m)  ?  ?

## 2021-04-13 DIAGNOSIS — J449 Chronic obstructive pulmonary disease, unspecified: Secondary | ICD-10-CM | POA: Diagnosis not present

## 2021-04-14 ENCOUNTER — Other Ambulatory Visit: Payer: Self-pay

## 2021-04-14 ENCOUNTER — Ambulatory Visit: Payer: Medicare Other | Attending: General Surgery

## 2021-04-14 VITALS — Wt 150.2 lb

## 2021-04-14 DIAGNOSIS — Z483 Aftercare following surgery for neoplasm: Secondary | ICD-10-CM | POA: Insufficient documentation

## 2021-04-14 NOTE — Therapy (Signed)
?OUTPATIENT PHYSICAL THERAPY SOZO SCREENING NOTE ? ? ?Patient Name: Jennifer [name] ?MRN: [id number] ?DOB:March 31, 1945, 76 y.o., female ?Today's Date: 04/14/2021 ? ?PCP: [name], MD ?REFERRING PROVIDER: Rolm Bookbinder, MD ? ? PT End of Session - 04/14/21 1551   ? ? Visit Number 2   # unchanged due to screen only  ? PT Start Time 3016   ? PT Stop Time 0109   ? PT Time Calculation (min) 17 min   ? Activity Tolerance Patient tolerated treatment well   ? Behavior During Therapy [facility] for tasks assessed/performed   ? ?  ?  ? ?  ? ? ?Past Medical History:  ?Diagnosis Date  ? Anxiety   ? panic attacks  ? Arthritis   ? Asthma   ? Bronchitis   ? COPD (chronic obstructive pulmonary disease) ([place])   ? Depression   ? Dyspnea   ? 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 Gastritis   ? GERD (gastroesophageal reflux disease)   ? History of radiation therapy   ? Right Breast 02/03/21-02/28/21- Dr. Gery Pray  ? Hypertension   ? PONV (postoperative nausea and vomiting)   ? Scoliosis   ? Ulcer   ? ?Past Surgical History:  ?Procedure Laterality Date  ? ABDOMINAL HYSTERECTOMY    ? ANTERIOR CERVICAL DECOMP/DISCECTOMY FUSION N/A 04/19/2017  ? Procedure: Anterior Cervical Decompression Fusion Cervical Three-Four, Cervical Four-Five, Cervical Five-Six;  Surgeon: Kary Kos, MD;  Location: [facility];  Service: Neurosurgery;  Laterality: N/A;  Anterior Cervical Decompression Fusion Cervical Three-Four, Cervical Four-Five, Cervical Five-Six  ? BREAST BIOPSY Right 11/14/2020  ? [place] Bx, Venus Clip, Path Pending  ? BREAST LUMPECTOMY WITH RADIOACTIVE SEED AND SENTINEL LYMPH NODE BIOPSY Right 12/27/2020  ? Procedure: RIGHT BREAST LUMPECTOMY WITH RADIOACTIVE SEED AND AXILLARY SENTINEL LYMPH NODE BIOPSY;  Surgeon: Rolm Bookbinder, MD;  Location: [facility];  Service: General;  Laterality: Right;  ? CHOLECYSTECTOMY  1996  ? DILATION AND CURETTAGE OF UTERUS  1971  ? LAPAROSCOPIC REMOVAL OF MESENTERIC MASS    ? [place] SURGERY  1998  ? [facility]  ?  PARTIAL HYSTERECTOMY  1981  ? prolapsed uterus  ? TUBAL LIGATION  1978  ? ?Patient Active Problem List  ? Diagnosis Date Noted  ? 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female, estrogen receptor positive ([place]) 12/17/2020  ? Nasal congestion 02/04/2020  ? Sore throat 01/25/2020  ? Neck pain 08/19/2019  ? B12 deficiency 08/19/2019  ? Knee pain 02/04/2019  ? Memory change 02/04/2019  ? Radiculopathy of cervical spine 04/20/2017  ? Spondylosis of cervical spine 04/19/2017  ? Ear pain, left 03/27/2017  ? Right arm pain 11/12/2016  ? Vertigo 09/02/2016  ? Left low back pain 05/22/2015  ? Left hip pain 05/22/2015  ? SOB (shortness of breath) 11/11/2014  ? Right shoulder pain 07/16/2014  ? Health care maintenance 06/03/2014  ? Arm skin lesion, left 06/18/2013  ? URI (upper respiratory infection) 03/03/2013  ? Hyperglycemia 11/12/2012  ? Hypercholesterolemia 11/12/2012  ? Essential hypertension, benign 05/12/2012  ? GERD (gastroesophageal reflux disease) 05/12/2012  ? Gastritis 05/12/2012  ? History of colonic polyps 05/12/2012  ? Asthma 12/13/2011  ? Chronic restrictive lung disease 05/11/2011  ? ? ?REFERRING DIAG: right breast cancer at risk for lymphedema ? ?THERAPY DIAG:  ?Aftercare following surgery for neoplasm ? ?PERTINENT HISTORY: Rt lump with SLNB 12/27/20 with 0/2 LNs with radiation pending and no need for chemotherapy. Hx: asthma, COPD uses  O2 at night ? ?PRECAUTIONS: right UE Lymphedema risk, None ? ?SUBJECTIVE: Pt returns for her 3 month L-Dex screen.  ?PAIN:  ?Are you having pain? No ? ?SOZO SCREENING: ? ?Patient was assessed today using the SOZO machine to determine the lymphedema index score. This was compared to her baseline score. It was determined that she is within the recommended range when compared to her baseline and no further action is needed at this time. She will continue SOZO screenings. These are done every 3 months for 2 years post operatively followed by every 6 months for  2 years, and then annually. Also issued bil UE 3 way raises for HEP. See below. ? ? ? ? ? ?Otelia Limes, PTA ?04/14/2021, 4:06 PM ? ? 3 Way Raises: ? ? ? ? ? ?Starting Position:  ?Leaning against wall, walk feet a few inches away from the wall and make tummy tight (tuck hips underneath you) ?Press back/shoulders/head against wall as much as possible. ?Keep thumbs up to ceiling, elbows straight and shoulders relaxed/down throughout. ? ?1. Lift arms in front to shoulder height ?2. Lift arms a little wider into a "V" to shoulder height ?3. Lift arms out to sides in a "T" to shoulder height ? ?Perform 10 times in each direction. ?Hold 1-2 lbs to start with and work up to 2-3 sets of 10/day. ?Perform 3-4 times/week. Increase weight as able, decreasing sets of 10 each time you increase weights, then slowly working your way back up to 2-3 sets each time.  ? ? ?Cancer Rehab [phone] ? ?

## 2021-04-14 NOTE — Patient Instructions (Signed)

## 2021-05-09 ENCOUNTER — Other Ambulatory Visit: Payer: Self-pay | Admitting: Internal Medicine

## 2021-05-14 DIAGNOSIS — J449 Chronic obstructive pulmonary disease, unspecified: Secondary | ICD-10-CM | POA: Diagnosis not present

## 2021-05-23 ENCOUNTER — Telehealth: Payer: Self-pay | Admitting: *Deleted

## 2021-05-23 ENCOUNTER — Other Ambulatory Visit: Payer: Self-pay | Admitting: Internal Medicine

## 2021-05-26 ENCOUNTER — Inpatient Hospital Stay: Payer: Medicare Other | Attending: Radiation Oncology | Admitting: Adult Health

## 2021-05-26 ENCOUNTER — Other Ambulatory Visit: Payer: Self-pay

## 2021-05-26 ENCOUNTER — Encounter: Payer: Self-pay | Admitting: Adult Health

## 2021-05-26 VITALS — BP 118/65 | HR 69 | Temp 97.7°F | Wt 148.5 lb

## 2021-05-26 DIAGNOSIS — Z170 Estrogen receptor positive status [ER+]: Secondary | ICD-10-CM | POA: Diagnosis not present

## 2021-05-26 DIAGNOSIS — R251 Tremor, unspecified: Secondary | ICD-10-CM | POA: Diagnosis not present

## 2021-05-26 DIAGNOSIS — Z79810 Long term (current) use of selective estrogen receptor modulators (SERMs): Secondary | ICD-10-CM | POA: Diagnosis not present

## 2021-05-26 DIAGNOSIS — Z79899 Other long term (current) drug therapy: Secondary | ICD-10-CM | POA: Insufficient documentation

## 2021-05-26 DIAGNOSIS — C50411 Malignant neoplasm of upper-outer quadrant of right female breast: Secondary | ICD-10-CM

## 2021-05-26 NOTE — Progress Notes (Signed)
SURVIVORSHIP VISIT: ? ? ?BRIEF ONCOLOGIC HISTORY:  ?Oncology History Overview Note  ? Cancer Staging  ?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female, estrogen receptor positive ([place]) ?Staging form: Breast, AJCC 8th Edition ?- Clinical stage from 11/14/2020: Stage IA (cT1c, cN0, cM0, G2, ER+, PR+, HER2-) - Signed by Truitt Merle, MD on 12/17/2020 ?Stage prefix: Initial diagnosis ?Histologic grading system: 3 grade system ?- Pathologic stage from 12/27/2020: Stage IA (pT1c, pN0, cM0, G2, ER+, PR+, HER2-) - Signed by Truitt Merle, MD on 02/25/2021 ?Stage prefix: Initial diagnosis ?Histologic grading system: 3 grade system ?Residual tumor (R): R0 - None ? ? ?  ?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female, estrogen receptor positive ([place])  ?11/04/2020 Mammogram  ? EXAM: ?DIGITAL DIAGNOSTIC UNILATERAL RIGHT MAMMOGRAM WITH TOMOSYNTHESIS AND CAD; ULTRASOUND RIGHT BREAST LIMITED ? ?FINDINGS: ?Targeted ultrasound is performed, showing irregular hypoechoic mass at the right breast 11 o'clock 5 cm from nipple measuring 1 x 0.5 x 1.2 cm correlating to the mammographic mass. Ultrasound of the right axilla is negative. ?  ?11/14/2020 Cancer Staging  ? Staging form: Breast, AJCC 8th Edition ?- Clinical stage from 11/14/2020: Stage IA (cT1c, cN0, cM0, G2, ER+, PR+, HER2-) - Signed by Truitt Merle, MD on 12/17/2020 ?Stage prefix: Initial diagnosis ?Histologic grading system: 3 grade system ? ?  ?11/14/2020 Pathology Results  ? DIAGNOSIS:  ?A. BREAST, RIGHT AT 11:00, 5 CM FROM THE NIPPLE; ULTRASOUND-GUIDED CORE NEEDLE BIOPSY:  ?- INVASIVE MAMMARY CARCINOMA, NO SPECIAL TYPE.  ?Histologic grade of invasive carcinoma: Grade 2  ? ? ?ADDENDUM:  ?CASE SUMMARY: BREAST BIOMARKER TESTS  ?Estrogen Receptor (ER) Status: POSITIVE  ?        Percentage of cells with nuclear positivity: >90%  ?        Average intensity of staining: Strong  ? ?Progesterone Receptor (PgR) Status: POSITIVE  ?        Percentage of  cells with nuclear positivity: >90%  ?        Average intensity of staining: Strong  ? ?HER2 (by immunohistochemistry): NEGATIVE (Score 1+)  ?Ki-67: Not performed  ?  ?12/17/2020 Initial Diagnosis  ? 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female, estrogen receptor positive ([place]) ? ?  ?12/27/2020 Cancer Staging  ? Staging form: Breast, AJCC 8th Edition ?- Pathologic stage from 12/27/2020: Stage IA (pT1c, pN0, cM0, G2, ER+, PR+, HER2-) - Signed by Truitt Merle, MD on 02/25/2021 ?Stage prefix: Initial diagnosis ?Histologic grading system: 3 grade system ?Residual tumor (R): R0 - None ? ?  ?12/27/2020 Surgery  ? Right lumpectomy: IDC g2, 1.2cm, margins negative, 2 SLN negative ?  ?02/03/2021 - 02/28/2021 Radiation Therapy  ? Site Technique Total Dose (Gy) Dose per Fx (Gy) Completed Fx Beam Energies  ?Breast, Right: Breast_R 3D 40.05/40.05 2.67 15/15 6XFFF  ?Breast, Right: Breast_R_Bst 3D 10/10 2 5/5 6X  ? ?  ?03/19/2021 -  Anti-estrogen oral therapy  ? 20 mg Tamoxifen ?  ? ? ?INTERVAL HISTORY:  ?Jennifer [name] to review her survivorship care plan detailing her treatment course for breast cancer, as well as monitoring long-term side effects of that treatment, education regarding health maintenance, screening, and overall wellness and health promotion.    ? ?Overall, Jennifer [name] reports feeling quite well.  She continues on Tamoxifen daily.  She has noted a slight tremor lately and wonders if this is related.  Otherwise she is feeling well.   ? ?REVIEW OF SYSTEMS:  ?Review of Systems  ?  Constitutional:  Negative for appetite change, chills, fatigue, fever and unexpected weight change.  ?HENT:   Negative for hearing loss, lump/mass and trouble swallowing.   ?Eyes:  Negative for eye problems and icterus.  ?Respiratory:  Negative for chest tightness, cough and shortness of breath.   ?Cardiovascular:  Negative for chest pain, leg swelling and palpitations.  ?Gastrointestinal:  Negative for abdominal distention, abdominal  pain, constipation, diarrhea, nausea and vomiting.  ?Endocrine: Negative for hot flashes.  ?Genitourinary:  Negative for difficulty urinating.   ?Musculoskeletal:  Negative for arthralgias.  ?Skin:  Negative for itching and rash.  ?Neurological:  Negative for dizziness, extremity weakness, headaches and numbness.  ?Hematological:  Negative for adenopathy. Does not bruise/bleed easily.  ?Psychiatric/Behavioral:  Negative for depression. The patient is not nervous/anxious.   ?Breast: Denies any new nodularity, masses, tenderness, nipple changes, or nipple discharge.  ? ? ? ? ?ONCOLOGY TREATMENT TEAM:  ?1. Surgeon:  Dr. Donne Hazel at [facility] ?2. Medical Oncologist: Dr. Burr Medico  ?3. Radiation Oncologist: Dr. Sondra Come ?  ? ?PAST MEDICAL/SURGICAL HISTORY:  ?Past Medical History:  ?Diagnosis Date  ? Anxiety   ? panic attacks  ? Arthritis   ? Asthma   ? Bronchitis   ? COPD (chronic obstructive pulmonary disease) ([place])   ? Depression   ? Dyspnea   ? 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 Gastritis   ? GERD (gastroesophageal reflux disease)   ? History of radiation therapy   ? Right Breast 02/03/21-02/28/21- Dr. Gery Pray  ? Hypertension   ? PONV (postoperative nausea and vomiting)   ? Scoliosis   ? Ulcer   ? ?Past Surgical History:  ?Procedure Laterality Date  ? ABDOMINAL HYSTERECTOMY    ? ANTERIOR CERVICAL DECOMP/DISCECTOMY FUSION N/A 04/19/2017  ? Procedure: Anterior Cervical Decompression Fusion Cervical Three-Four, Cervical Four-Five, Cervical Five-Six;  Surgeon: Kary Kos, MD;  Location: [facility];  Service: Neurosurgery;  Laterality: N/A;  Anterior Cervical Decompression Fusion Cervical Three-Four, Cervical Four-Five, Cervical Five-Six  ? BREAST BIOPSY Right 11/14/2020  ? [place] Bx, Venus Clip, Path Pending  ? BREAST LUMPECTOMY WITH RADIOACTIVE SEED AND SENTINEL LYMPH NODE BIOPSY Right 12/27/2020  ? Procedure: RIGHT BREAST LUMPECTOMY WITH RADIOACTIVE SEED AND AXILLARY SENTINEL LYMPH NODE BIOPSY;  Surgeon:  Rolm Bookbinder, MD;  Location: [facility];  Service: General;  Laterality: Right;  ? CHOLECYSTECTOMY  1996  ? DILATION AND CURETTAGE OF UTERUS  1971  ? LAPAROSCOPIC REMOVAL OF MESENTERIC MASS    ? [place] SURGERY  1998  ? [facility]  ? PARTIAL HYSTERECTOMY  1981  ? prolapsed uterus  ? TUBAL LIGATION  1978  ? ? ? ?ALLERGIES:  ?Allergies  ?Allergen Reactions  ? Advair Diskus [Fluticasone-Salmeterol] Other (See Comments)  ?  Causes blisters in her mouth  ? Codeine   ?  Unknown reaction  ? ? ? ?CURRENT MEDICATIONS:  ?Outpatient Encounter Medications as of 05/26/2021  ?Medication Sig  ? Apoaequorin (PREVAGEN PO) Take 1 capsule by mouth daily. Pt takes medication every other day  ? citalopram (CELEXA) 40 MG tablet Take 1 tablet (40 mg total) by mouth daily.  ? clonazePAM (KLONOPIN) 0.5 MG tablet TAKE 1/2 TO 1 TABLET BY MOUTH DAILY AS NEEDED.  ? Cyanocobalamin (B-12) 5000 MCG CAPS Take 5,000 Units by mouth daily. Metabolism support  ? hydrochlorothiazide (HYDRODIURIL) 25 MG tablet Take 1 tablet (25 mg total) by mouth daily.  ? Multiple Vitamin (MULTIVITAMIN WITH MINERALS) TABS tablet Take 1 tablet by mouth  daily.  ? OXYGEN Place 2 L into the nose at bedtime. nasal cannula  ? pravastatin (PRAVACHOL) 20 MG tablet Take 1 tablet (20 mg total) by mouth daily.  ? tamoxifen (NOLVADEX) 20 MG tablet Take 1 tablet (20 mg total) by mouth daily.  ? Tiotropium Bromide-Olodaterol (STIOLTO RESPIMAT) 2.5-2.5 MCG/ACT AERS Inhale 1 puff into the lungs daily.  ? gabapentin (NEURONTIN) 300 MG capsule Take 300 mg by mouth daily. (Patient not taking: Reported on 03/31/2021)  ? traMADol (ULTRAM) 50 MG tablet Take 2 tablets (100 mg total) by mouth every 6 (six) hours as needed. (Patient not taking: Reported on 05/26/2021)  ? [DISCONTINUED] oxymetazoline (AFRIN) 0.05 % nasal spray Place 1 spray into both nostrils daily. Congestion (Patient not taking: Reported on 03/31/2021)  ? ?No facility-administered encounter medications on file as of 05/26/2021.   ? ? ? ?ONCOLOGIC FAMILY HISTORY:  ?Family History  ?Problem Relation Age of Onset  ? Stroke Mother   ? Cancer Father   ?     unknown type  ? CVA Maternal Grandmother   ? Breast cancer Neg Hx   ? Colon cancer Ne

## 2021-06-03 ENCOUNTER — Encounter: Payer: Medicare Other | Admitting: Internal Medicine

## 2021-06-03 ENCOUNTER — Telehealth: Payer: Self-pay

## 2021-06-03 NOTE — Telephone Encounter (Signed)
Pt advised future orders are in system - can do labs prior to appointment if she would like. ?Sched for tomorrow , 1130am ?

## 2021-06-03 NOTE — Telephone Encounter (Signed)
Patient called to reschedule her physical for this Friday, 06/06/2021.  Patient would like to know if she needs to have labs before her appointment. ?

## 2021-06-04 ENCOUNTER — Other Ambulatory Visit (INDEPENDENT_AMBULATORY_CARE_PROVIDER_SITE_OTHER): Payer: Medicare Other

## 2021-06-04 DIAGNOSIS — R739 Hyperglycemia, unspecified: Secondary | ICD-10-CM | POA: Diagnosis not present

## 2021-06-04 DIAGNOSIS — E7800 Pure hypercholesterolemia, unspecified: Secondary | ICD-10-CM

## 2021-06-04 DIAGNOSIS — I10 Essential (primary) hypertension: Secondary | ICD-10-CM

## 2021-06-04 LAB — LIPID PANEL
Cholesterol: 150 mg/dL (ref 0–200)
HDL: 61.7 mg/dL (ref >39.00)
LDL Cholesterol: 66 mg/dL (ref 0–99)
NonHDL: 88.07
Total CHOL/HDL Ratio: 2
Triglycerides: 110 mg/dL (ref 0.0–149.0)
VLDL: 22 mg/dL (ref 0.0–40.0)

## 2021-06-04 LAB — BASIC METABOLIC PANEL
BUN: 7 mg/dL (ref 6–23)
CO2: 36 mEq/L — ABNORMAL HIGH (ref 19–32)
Calcium: 9.4 mg/dL (ref 8.4–10.5)
Chloride: 99 mEq/L (ref 96–112)
Creatinine, Ser: 0.62 mg/dL (ref 0.40–1.20)
GFR: 87.76 mL/min (ref >60.00)
Glucose, Bld: 96 mg/dL (ref 70–99)
Potassium: 3.1 mEq/L — ABNORMAL LOW (ref 3.5–5.1)
Sodium: 141 mEq/L (ref 135–145)

## 2021-06-04 LAB — HEPATIC FUNCTION PANEL
ALT: 14 U/L (ref 0–35)
AST: 21 U/L (ref 0–37)
Albumin: 4.1 g/dL (ref 3.5–5.2)
Alkaline Phosphatase: 37 U/L — ABNORMAL LOW (ref 39–117)
Bilirubin, Direct: 0.1 mg/dL (ref 0.0–0.3)
Total Bilirubin: 0.5 mg/dL (ref 0.2–1.2)
Total Protein: 6.4 g/dL (ref 6.0–8.3)

## 2021-06-04 LAB — TSH: TSH: 0.95 u[IU]/mL (ref 0.35–5.50)

## 2021-06-04 LAB — HEMOGLOBIN A1C: Hgb A1c MFr Bld: 5.5 % (ref 4.6–6.5)

## 2021-06-05 ENCOUNTER — Other Ambulatory Visit: Payer: Self-pay

## 2021-06-05 MED ORDER — POTASSIUM CHLORIDE CRYS ER 10 MEQ PO TBCR: EXTENDED_RELEASE_TABLET | ORAL | 0 refills | Status: DC

## 2021-06-06 ENCOUNTER — Ambulatory Visit (INDEPENDENT_AMBULATORY_CARE_PROVIDER_SITE_OTHER): Payer: Medicare Other | Admitting: Internal Medicine

## 2021-06-06 ENCOUNTER — Encounter: Payer: Self-pay | Admitting: Internal Medicine

## 2021-06-06 VITALS — BP 126/76 | HR 74 | Temp 97.8°F | Resp 13 | Ht 61.0 in | Wt 148.0 lb

## 2021-06-06 DIAGNOSIS — E7800 Pure hypercholesterolemia, unspecified: Secondary | ICD-10-CM

## 2021-06-06 DIAGNOSIS — C50411 Malignant neoplasm of upper-outer quadrant of right female breast: Secondary | ICD-10-CM

## 2021-06-06 DIAGNOSIS — Z170 Estrogen receptor positive status [ER+]: Secondary | ICD-10-CM | POA: Diagnosis not present

## 2021-06-06 DIAGNOSIS — K2100 Gastro-esophageal reflux disease with esophagitis, without bleeding: Secondary | ICD-10-CM | POA: Diagnosis not present

## 2021-06-06 DIAGNOSIS — J984 Other disorders of lung: Secondary | ICD-10-CM

## 2021-06-06 DIAGNOSIS — J4520 Mild intermittent asthma, uncomplicated: Secondary | ICD-10-CM

## 2021-06-06 DIAGNOSIS — Z0000 Encounter for general adult medical examination without abnormal findings: Secondary | ICD-10-CM

## 2021-06-06 DIAGNOSIS — R251 Tremor, unspecified: Secondary | ICD-10-CM

## 2021-06-06 DIAGNOSIS — E876 Hypokalemia: Secondary | ICD-10-CM

## 2021-06-06 DIAGNOSIS — I10 Essential (primary) hypertension: Secondary | ICD-10-CM | POA: Diagnosis not present

## 2021-06-06 DIAGNOSIS — Z86010 Personal history of colonic polyps: Secondary | ICD-10-CM

## 2021-06-06 DIAGNOSIS — R739 Hyperglycemia, unspecified: Secondary | ICD-10-CM | POA: Diagnosis not present

## 2021-06-06 NOTE — Assessment & Plan Note (Signed)
Physical today 06/06/21.  Colonoscopy 2018  Mammogram due 10/2021.

## 2021-06-06 NOTE — Progress Notes (Unsigned)
[name], female   DOB: [birth year], 76 y.o.   MRN: [id number]   Subjective:    [name], female    DOB: September 27, 1945, 76 y.o.   MRN: [id number]  This visit occurred during the SARS-CoV-2 public health emergency.  Safety protocols were in place, including screening questions prior to the visit, additional usage of staff PPE, and extensive cleaning of exam room while observing appropriate contact time as indicated for disinfecting solutions.   Patient here for her physical exam   Chief Complaint  Patient presents with   Follow-up    CPE   .   HPI Recent diagnosis of stage 1A right breast cancer.  S/p right breast lumpectomy and right axillary sentinel node biopsy - 12/27/20. S/p adjuvant radiation therapy.  Seeing oncology.  On tamoxifen.  Mammogram due 10/2021.  Tolerating relatively well.  Has noticed tremors.  Mentioned the possibility of tamoxifen side effect.  Wishes to try and remain on tamoxifen.  Had discussed neurology referral.  She is agreeable to referral.  Eating.  No nausea or vomiting.  No abdominal pain.  Bowels moving.  No cough or congestion. Using spiriva.    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 Gery Pray   Hypertension    PONV (postoperative nausea and vomiting)    Scoliosis    Ulcer    Past Surgical History:  Procedure Laterality Date   ABDOMINAL HYSTERECTOMY     ANTERIOR CERVICAL DECOMP/DISCECTOMY FUSION N/A 04/19/2017   Procedure: Anterior Cervical Decompression Fusion Cervical Three-Four, Cervical Four-Five, Cervical Five-Six;  Surgeon: Kary Kos, MD;  Location: [facility];  Service: Neurosurgery;  Laterality: N/A;   Anterior Cervical Decompression Fusion Cervical Three-Four, Cervical Four-Five, Cervical Five-Six   BREAST BIOPSY Right 11/14/2020   [place] Bx, Venus Clip, Path Pending   BREAST LUMPECTOMY WITH RADIOACTIVE SEED AND SENTINEL LYMPH NODE BIOPSY Right 12/27/2020   Procedure: RIGHT BREAST LUMPECTOMY WITH RADIOACTIVE SEED AND AXILLARY SENTINEL LYMPH NODE BIOPSY;  Surgeon: Rolm Bookbinder, MD;  Location: [facility];  Service: General;  Laterality: Right;   [place]   LAPAROSCOPIC REMOVAL OF MESENTERIC MASS     LUMBAR [place]   [facility]   PARTIAL HYSTERECTOMY  1981   prolapsed uterus   TUBAL LIGATION  1978   Family History  Problem Relation Age of Onset   Stroke Mother    Cancer Father        unknown type   CVA Maternal Grandmother    Breast cancer Neg Hx    Colon cancer Neg Hx    Social History   Socioeconomic History   Marital status: Married    Spouse name: Marchia Bond "[name]"   Number of children: 2   Years of education: Not on file   Highest education level: Not on file  Occupational History   Occupation: retired Pharmacist, hospital  Tobacco Use   Smoking status: Never   Smokeless tobacco: Never  Vaping Use   Vaping Use: Never used  Substance and Sexual  Activity   Alcohol use: No    Alcohol/week: 0.0 standard drinks   Drug use: No   Sexual activity: Not Currently  Other Topics Concern   Not on file  Social History Narrative   Not on file   Social Determinants of Health   Financial Resource Strain: Low Risk    Difficulty of Paying Living Expenses: Not hard at all  Food Insecurity: No Food Insecurity   Worried About Charity fundraiser in the Last Year: Never true   [place] in the Last Year: Never true  Transportation Needs: No Transportation Needs   Lack of Transportation (Medical): No   Lack of Transportation (Non-Medical): No  Physical Activity: Unknown   Days of Exercise per Week: 0 days   Minutes of Exercise per  Session: Not on file  Stress: No Stress Concern Present   Feeling of Stress : Not at all  Social Connections: Unknown   Frequency of Communication with Friends and Family: More than three times a week   Frequency of Social Gatherings with Friends and Family: Not on file   Attends Religious Services: Not on file   Active Member of Clubs or Organizations: Not on file   Attends Archivist Meetings: Not on file   Marital Status: Married     Review of Systems  Constitutional:  Negative for appetite change and unexpected weight change.  HENT:  Negative for congestion, sinus pressure and sore throat.   Eyes:  Negative for pain and visual disturbance.  Respiratory:  Negative for cough, chest tightness and shortness of breath.   Cardiovascular:  Negative for chest pain, palpitations and leg swelling.  Gastrointestinal:  Negative for abdominal pain, diarrhea, nausea and vomiting.  Genitourinary:  Negative for difficulty urinating and dysuria.  Musculoskeletal:  Negative for joint swelling and myalgias.  Skin:  Negative for color change and rash.  Neurological:  Negative for dizziness, light-headedness and headaches.  Hematological:  Negative for adenopathy. Does not bruise/bleed easily.  Psychiatric/Behavioral:  Negative for agitation, decreased concentration and dysphoric mood.       Objective:     BP 126/76 (BP Location: Left Arm, Patient Position: Sitting, Cuff Size: Small)   Pulse 74   Temp 97.8 F (36.6 C) (Temporal)   Resp 13   Ht 5' 1"  (1.549 m)   Wt 148 lb (67.1 kg)   SpO2 97%   BMI 27.96 kg/m  Wt Readings from Last 3 Encounters:  06/06/21 148 lb (67.1 kg)  05/26/21 148 lb 8 oz (67.4 kg)  04/14/21 150 lb 4 oz (68.2 kg)    Physical Exam Vitals reviewed.  Constitutional:      General: She is not in acute distress.    Appearance: Normal appearance.  HENT:     Head: Normocephalic and atraumatic.     Right Ear: External ear normal.     Left Ear: External ear  normal.  Eyes:     General: No scleral icterus.       Right eye: No discharge.        Left eye: No discharge.     Conjunctiva/sclera: Conjunctivae normal.  Neck:     Thyroid: No thyromegaly.  Cardiovascular:     Rate and Rhythm: Normal rate and regular rhythm.  Pulmonary:     Effort: No respiratory distress.     Breath sounds: Normal breath sounds. No wheezing.     Comments: Breasts:  s/p right breast lumpectomy.  Well healed.  No  nipple discharge present.  No palpable lumps or axillary adenopathy.   Abdominal:     General: Bowel sounds are normal.     Palpations: Abdomen is soft.     Tenderness: There is no abdominal tenderness.  Musculoskeletal:        General: No swelling or tenderness.     Cervical back: Neck supple. No tenderness.  Lymphadenopathy:     Cervical: No cervical adenopathy.  Skin:    Findings: No erythema or rash.  Neurological:     Mental Status: She is alert.  Psychiatric:        Mood and Affect: Mood normal.        Behavior: Behavior normal.     Outpatient Encounter Medications as of 06/06/2021  Medication Sig   Apoaequorin (PREVAGEN PO) Take 1 capsule by mouth daily. Pt takes medication every other day   citalopram (CELEXA) 40 MG tablet Take 1 tablet (40 mg total) by mouth daily.   clonazePAM (KLONOPIN) 0.5 MG tablet TAKE 1/2 TO 1 TABLET BY MOUTH DAILY AS NEEDED.   Cyanocobalamin (B-12) 5000 MCG CAPS Take 5,000 Units by mouth daily. Metabolism support   gabapentin (NEURONTIN) 300 MG capsule Take 300 mg by mouth daily.   hydrochlorothiazide (HYDRODIURIL) 25 MG tablet Take 1 tablet (25 mg total) by mouth daily.   Multiple Vitamin (MULTIVITAMIN WITH MINERALS) TABS tablet Take 1 tablet by mouth daily.   OXYGEN Place 2 L into the nose at bedtime. nasal cannula   potassium chloride (KLOR-CON M) 10 MEQ tablet Take one tablet by mouth twice a day for two days then take one tablet by mouth daily thereafter.   pravastatin (PRAVACHOL) 20 MG tablet Take 1 tablet  (20 mg total) by mouth daily.   tamoxifen (NOLVADEX) 20 MG tablet Take 1 tablet (20 mg total) by mouth daily.   Tiotropium Bromide-Olodaterol (STIOLTO RESPIMAT) 2.5-2.5 MCG/ACT AERS Inhale 1 puff into the lungs daily.   traMADol (ULTRAM) 50 MG tablet Take 2 tablets (100 mg total) by mouth every 6 (six) hours as needed.   No facility-administered encounter medications on file as of 06/06/2021.     Lab Results  Component Value Date   WBC 10.0 12/19/2020   HGB 14.2 12/19/2020   HCT 44.2 12/19/2020   PLT 231 12/19/2020   GLUCOSE 96 06/04/2021   CHOL 150 06/04/2021   TRIG 110.0 06/04/2021   HDL 61.70 06/04/2021   LDLDIRECT 148.4 11/03/2012   LDLCALC 66 06/04/2021   ALT 14 06/04/2021   AST 21 06/04/2021   NA 141 06/04/2021   K 3.1 (L) 06/04/2021   CL 99 06/04/2021   CREATININE 0.62 06/04/2021   BUN 7 06/04/2021   CO2 36 (H) 06/04/2021   TSH 0.95 06/04/2021   HGBA1C 5.5 06/04/2021       Assessment & Plan:   Problem List Items Addressed This Visit     Asthma    Continue spiriva.  Breathing stable.         Chronic restrictive lung disease    Breathing stable.         Essential hypertension, benign    Blood pressure has been doing well.  On hctz.  Follow pressures.         GERD (gastroesophageal reflux disease)    History of gastritis and varices.  On PPI.  F/u with GI.          Health care maintenance    Physical today 06/06/21.  Colonoscopy 2018  Mammogram due 10/2021.  History of colonic polyps    Colonoscopy 02/2016.        Hypercholesterolemia    On pravastatin.  Low cholesterol diet and exercise.  Follow lipid panel and liver function tests.         Hyperglycemia    Low carb diet and exercise.  Follow met b and a1c.        Hypokalemia    Just started potassium supplements.  Recheck potassium in 10 days.        Relevant Orders   Potassium   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female, estrogen receptor  positive ([place])    Recent diagnosis of stage 1A right breast cancer.  S/p right breast lumpectomy and right axillary sentinel node biopsy - 12/27/20. S/p adjuvant radiation therapy.  Seeing oncology.  On tamoxifen.        Tremor    Tremor present as outlined.  Oncology discussed possible side effect of tamoxifen.  She wants to remain on tamoxifen if possible.  Oncology had discussed neurology evaluation.  Discussed.  Does request referral to neurology.        Relevant Orders   Ambulatory referral to Neurology   Other Visit Diagnoses     Routine general medical examination at a health care facility    -  Primary        [name], MD

## 2021-06-07 ENCOUNTER — Encounter: Payer: Self-pay | Admitting: Internal Medicine

## 2021-06-07 DIAGNOSIS — R251 Tremor, unspecified: Secondary | ICD-10-CM | POA: Insufficient documentation

## 2021-06-07 DIAGNOSIS — E876 Hypokalemia: Secondary | ICD-10-CM | POA: Insufficient documentation

## 2021-06-07 NOTE — Assessment & Plan Note (Signed)
Breathing stable.

## 2021-06-07 NOTE — Assessment & Plan Note (Signed)
Blood pressure has been doing well.  On hctz.  Follow pressures.

## 2021-06-07 NOTE — Assessment & Plan Note (Signed)
Colonoscopy 02/2016.  

## 2021-06-07 NOTE — Assessment & Plan Note (Signed)
On pravastatin.  Low cholesterol diet and exercise.  Follow lipid panel and liver function tests.   

## 2021-06-07 NOTE — Assessment & Plan Note (Signed)
History of gastritis and varices.  On PPI.  F/u with GI.    

## 2021-06-07 NOTE — Assessment & Plan Note (Signed)
Tremor present as outlined.  Oncology discussed possible side effect of tamoxifen.  She wants to remain on tamoxifen if possible.  Oncology had discussed neurology evaluation.  Discussed.  Does request referral to neurology.

## 2021-06-07 NOTE — Assessment & Plan Note (Signed)
Low carb diet and exercise.  Follow met b and a1c.  

## 2021-06-07 NOTE — Addendum Note (Signed)
Addended by: Alisa Graff on: 06/07/2021 05:51 PM   Modules accepted: Orders

## 2021-06-07 NOTE — Assessment & Plan Note (Signed)
Recent diagnosis of stage 1A right breast cancer.  S/p right breast lumpectomy and right axillary sentinel node biopsy - 12/27/20. S/p adjuvant radiation therapy.  Seeing oncology.  On tamoxifen.

## 2021-06-07 NOTE — Assessment & Plan Note (Signed)
Just started potassium supplements.  Recheck potassium in 10 days.

## 2021-06-07 NOTE — Assessment & Plan Note (Signed)
Continue spiriva. Breathing stable. 

## 2021-06-10 ENCOUNTER — Other Ambulatory Visit: Payer: Self-pay | Admitting: Internal Medicine

## 2021-06-10 ENCOUNTER — Encounter: Payer: Self-pay | Admitting: Neurology

## 2021-06-12 ENCOUNTER — Telehealth: Payer: Self-pay | Admitting: Internal Medicine

## 2021-06-12 NOTE — Telephone Encounter (Signed)
Usually do not prescribe ear drops if pain in ear without actually seeing the ear.  If at all possible, it would be better for her to have the ear looked at - so know how to treat.  See if agreeable to be seen.  I am not here tomorrow or would work her in.

## 2021-06-12 NOTE — Telephone Encounter (Signed)
Pt called stating she has an ear infection and want to see if the provider can call her in some medication. I let the pt know she would have to be seen if she want new medication called in. Pt stated just have the provider call me

## 2021-06-12 NOTE — Telephone Encounter (Signed)
S/w pt - thinks has slight ear infection. Some tenderness in ear, some hearing change but not much.  Asking for something to be sent in for her due to her having a lot going on over the weekend ( graduations, party, etc) Pt offered appointment with Dr. Olivia Mackie tomorrow morning to be evaluated, pt declined and stated she was just in the office.  Asking if you could send in ear drops for her, and she will follow up after the weekend ?

## 2021-06-12 NOTE — Telephone Encounter (Signed)
I was able to schedule patient with Dr. Caryl Bis for 06/13/21 at 11 AM.  Patient has had right ear pain X 2 days and allergy like symptoms X 5 days. Afebrile, no drainage , but has sniffles , runny nose and sneezing. She is going to granddaughters graduation.

## 2021-06-13 ENCOUNTER — Encounter: Payer: Self-pay | Admitting: Family Medicine

## 2021-06-13 ENCOUNTER — Ambulatory Visit (INDEPENDENT_AMBULATORY_CARE_PROVIDER_SITE_OTHER): Payer: Medicare Other | Admitting: Family Medicine

## 2021-06-13 DIAGNOSIS — J449 Chronic obstructive pulmonary disease, unspecified: Secondary | ICD-10-CM | POA: Diagnosis not present

## 2021-06-13 DIAGNOSIS — H9201 Otalgia, right ear: Secondary | ICD-10-CM | POA: Diagnosis not present

## 2021-06-13 MED ORDER — AMOXICILLIN-POT CLAVULANATE 875-125 MG PO TABS: 1.0000 | ORAL_TABLET | Freq: Two times a day (BID) | ORAL | 0 refills | Status: DC

## 2021-06-13 NOTE — Progress Notes (Signed)
Virtual Visit via telephone Note  This visit type was conducted due to national recommendations for restrictions regarding the COVID-19 pandemic (e.g. social distancing).  This format is felt to be most appropriate for this patient at this time.  All issues noted in this document were discussed and addressed.  No physical exam was performed (except for noted visual exam findings with Video Visits).   I connected with Jennifer [name] today at 11:00 AM EDT by telephone and verified that I am speaking with the correct person using two identifiers. Location patient: home Location provider: home office Persons participating in the virtual visit: patient, provider  I discussed the limitations, risks, security and privacy concerns of performing an evaluation and management service by telephone and the availability of in person appointments. I also discussed with the patient that there may be a patient responsible charge related to this service. The patient expressed understanding and agreed to proceed.  Interactive audio and video telecommunications were attempted between this provider and patient, however failed, due to patient having technical difficulties OR patient did not have access to video capability.  We continued and completed visit with audio only.   Reason for visit: same day visit.   HPI: Right ear pain: Patient notes her symptoms started earlier this week with cold-like symptoms and feeling "yucky" with sneezing.  Then her right ear got stopped up.  She started over-the-counter allergy medication and Flonase and notes that has not really helped with her ear.  She has been using those for 2.5 days.  She notes minimal postnasal drip.  No cough.  No congestion.  She is not blowing much out of her nose.  No sneezing.  She felt as though she had a low-grade fever to start with.  No taste or smell disturbances.  No known COVID exposure.  She has not tested herself for COVID.  She does report she is  going to a graduation tonight.   ROS: See pertinent positives and negatives per HPI.  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 Gery Pray   Hypertension    PONV (postoperative nausea and vomiting)    Scoliosis    Ulcer     Past Surgical History:  Procedure Laterality Date   ABDOMINAL HYSTERECTOMY     ANTERIOR CERVICAL DECOMP/DISCECTOMY FUSION N/A 04/19/2017   Procedure: Anterior Cervical Decompression Fusion Cervical Three-Four, Cervical Four-Five, Cervical Five-Six;  Surgeon: Kary Kos, MD;  Location: [facility];  Service: Neurosurgery;  Laterality: N/A;  Anterior Cervical Decompression Fusion Cervical Three-Four, Cervical Four-Five, Cervical Five-Six   BREAST BIOPSY Right 11/14/2020   [place] Bx, Venus Clip, Path Pending   BREAST LUMPECTOMY WITH RADIOACTIVE SEED AND SENTINEL LYMPH NODE BIOPSY Right 12/27/2020   Procedure: RIGHT BREAST LUMPECTOMY WITH RADIOACTIVE SEED AND AXILLARY SENTINEL LYMPH NODE BIOPSY;  Surgeon: Rolm Bookbinder, MD;  Location: [facility];  Service: General;  Laterality: Right;   [place]  PARTIAL HYSTERECTOMY  1981   prolapsed uterus   TUBAL LIGATION  1978    Family History  Problem Relation Age of Onset   Stroke Mother    Cancer Father        unknown type   CVA Maternal Grandmother    Breast cancer Neg Hx    Colon cancer Neg Hx     SOCIAL HX: nonsmoker   Current Outpatient Medications:    amoxicillin-clavulanate (AUGMENTIN) 875-125 MG tablet, Take 1 tablet by mouth 2 (two) times daily., Disp: 14 tablet, Rfl:  0   Apoaequorin (PREVAGEN PO), Take 1 capsule by mouth daily. Pt takes medication every other day, Disp: , Rfl:    citalopram (CELEXA) 40 MG tablet, Take 1 tablet (40 mg total) by mouth daily., Disp: 90 tablet, Rfl: 1   clonazePAM (KLONOPIN) 0.5 MG tablet, TAKE 1/2 TO 1 TABLET BY MOUTH DAILY AS NEEDED., Disp: 30 tablet, Rfl: 1   Cyanocobalamin (B-12) 5000 MCG CAPS, Take 5,000 Units by mouth daily. Metabolism support, Disp: , Rfl:    gabapentin (NEURONTIN) 300 MG capsule, Take 300 mg by mouth daily., Disp: , Rfl:    hydrochlorothiazide (HYDRODIURIL) 25 MG tablet, Take 1 tablet (25 mg total) by mouth daily., Disp: 90 tablet, Rfl: 0   Multiple Vitamin (MULTIVITAMIN WITH MINERALS) TABS tablet, Take 1 tablet by mouth daily., Disp: , Rfl:    OXYGEN, Place 2 L into the nose at bedtime. nasal cannula, Disp: , Rfl:    potassium chloride (KLOR-CON M) 10 MEQ tablet, Take one tablet by mouth twice a day for two days then take one tablet by mouth daily thereafter., Disp: 32 tablet, Rfl: 0   pravastatin (PRAVACHOL) 20 MG tablet, Take 1 tablet (20 mg total) by mouth daily., Disp: 90 tablet, Rfl: 0   tamoxifen (NOLVADEX) 20 MG tablet, Take 1 tablet (20 mg total) by mouth daily., Disp: 30 tablet, Rfl: 3   Tiotropium Bromide-Olodaterol (STIOLTO RESPIMAT) 2.5-2.5 MCG/ACT AERS, Inhale 1 puff into the lungs daily., Disp: , Rfl:    traMADol (ULTRAM) 50 MG tablet, Take 2 tablets (100 mg total) by mouth every 6 (six) hours as needed., Disp: 10 tablet, Rfl: 0  EXAM: This was a telephone visit and thus no exam was completed.  ASSESSMENT AND PLAN:  Discussed the following assessment and plan:  Problem List Items Addressed This Visit     Ear pain, right    Discussed that I am unable to truly determine if this is infected given that I am unable to do an exam though she does have ear pain in the setting of fairly minimal other symptoms at this time.  Discussed empiric treatment for an ear infection.  We will treat with  Augmentin 1 tablet twice daily for 7 days.  Discussed she could continue the allergy medication and Flonase if it is beneficial.  I did discuss that she should do a home test for COVID.  She will let us know if this is positive.  She will let us know if she is not improving by next week.       Relevant Medications   amoxicillin-clavulanate (AUGMENTIN) 875-125 MG tablet    Return if symptoms worsen or fail to improve.   I discussed the assessment and treatment plan with the patient. The patient was provided an opportunity to ask questions and all were answered. The patient agreed with the plan and demonstrated an understanding of the instructions.   The patient was advised to call back  or seek an in-person evaluation if the symptoms worsen or if the condition fails to improve as anticipated.  I provided 8 minutes of non-face-to-face time during this encounter.   Tommi Rumps, MD

## 2021-06-13 NOTE — Assessment & Plan Note (Signed)
Discussed that I am unable to truly determine if this is infected given that I am unable to do an exam though she does have ear pain in the setting of fairly minimal other symptoms at this time.  Discussed empiric treatment for an ear infection.  We will treat with Augmentin 1 tablet twice daily for 7 days.  Discussed she could continue the allergy medication and Flonase if it is beneficial.  I did discuss that she should do a home test for COVID.  She will let us know if this is positive.  She will let us know if she is not improving by next week.

## 2021-06-18 ENCOUNTER — Other Ambulatory Visit (INDEPENDENT_AMBULATORY_CARE_PROVIDER_SITE_OTHER): Payer: Medicare Other

## 2021-06-18 DIAGNOSIS — E876 Hypokalemia: Secondary | ICD-10-CM | POA: Diagnosis not present

## 2021-06-18 LAB — POTASSIUM: Potassium: 3.2 mEq/L — ABNORMAL LOW (ref 3.5–5.1)

## 2021-06-19 ENCOUNTER — Other Ambulatory Visit: Payer: Self-pay

## 2021-06-19 ENCOUNTER — Telehealth: Payer: Self-pay

## 2021-06-19 MED ORDER — POTASSIUM CHLORIDE CRYS ER 10 MEQ PO TBCR: EXTENDED_RELEASE_TABLET | ORAL | 1 refills | Status: DC

## 2021-06-19 NOTE — Telephone Encounter (Signed)
Error

## 2021-06-20 ENCOUNTER — Other Ambulatory Visit: Payer: Self-pay

## 2021-06-20 DIAGNOSIS — E876 Hypokalemia: Secondary | ICD-10-CM

## 2021-06-23 ENCOUNTER — Ambulatory Visit: Payer: Medicare Other | Attending: General Surgery

## 2021-06-23 VITALS — Wt 148.4 lb

## 2021-06-23 DIAGNOSIS — Z483 Aftercare following surgery for neoplasm: Secondary | ICD-10-CM | POA: Insufficient documentation

## 2021-06-23 NOTE — Progress Notes (Signed)
Assessment/Plan:   Tremor, overall very minimal  -Could be a side effect of the tamoxifen, although given asymmetry, that is not necessarily the case.  I actually saw very little tremor today.  She has been off of the tamoxifen for 5 days, so maybe that is why. Nonetheless, I discussed with patient that I do not see anything worrisome about the quality of her tremor today.  There are no parkinsonian features.  I see no evidence of a neurodegenerative process.  There is nothing focal or lateralizing on her examination today, with the exception of the fact that she complains about it on the left side and not the right.  Unless things change or get worse, I really would not recommend stopping or changing the tamoxifen.  I also would not add any medications to help with this degree of tremor, as I think it would likely give him more side effects than add benefit.  She agrees with that.  Recent breast cancer, stage Ia, diagnosed, October 2022  -Status postlumpectomy and radiation  -Patient did not realize she was still supposed to be on tamoxifen.  Her prescription ran out and she thought that was the end of therapy.  I told her that patients are generally on this for 5 to 10 years, which looks like this is her treatment plan based on notes.  I told her to call the physician to see if that is the case, and to get refills.  She expresses understanding and agrees.  Subjective:   Jennifer [name] was seen today in the movement disorders clinic for neurologic consultation at the request of [name], MD.  The consultation is for the evaluation of tremor.  Medical records made available to me are reviewed.  Patient is a 76 year old female with a history of stage Ia invasive ductal breast carcinoma, diagnosed October, 2022.  Patient was treated with lumpectomy, radiation and is currently on tamoxifen.  Notes indicate that at his follow-up visit with her nurse practitioner on May 8, she was complaining about  some mild tremors.  She was told that if it did not improve within the next month, they would recommend stopping the tamoxifen for 2 weeks to see if it improves.  Patient saw primary care physician about 10 days later and complained about tremor as well, but noted that patient wanted to stay on the tamoxifen.  She is referred here for further evaluation.  She states that she ran out of the medication 5 days ago and thought that she was done with the course completely so d/c it  Tremor: Yes.     How long has it been going on? Jan or later (already on tamoxifen) - no change in last 5 days since off of tamoxifen - "I am a nervous person anyway"  At rest or with activation?  activation  When is it noted the most?  Holding phone  Fam hx of tremor?  Yes.  , mom and GM (materal)  Located where?  L hand  Affected by caffeine:  doesn't drink any  Affected by alcohol:  doesn't drink any  Affected by stress:  No.  Affected by fatigue:  No.  Spills soup if on spoon:  unknown as she is R handed  Tremor inducing meds:  Yes.  , tamoxifen  Other Specific Symptoms:  Voice: voice is good Sleep: sleeps well  Vivid Dreams:  No.  Acting out dreams:  No. Wet Pillows: No. Postural symptoms:  No.  Falls?  No. Bradykinesia symptoms: no bradykinesia noted Loss of smell:  No. Loss of taste:  No. Urinary Incontinence:  No. Difficulty Swallowing:  No. Handwriting, micrographia: No. N/V:  No. Lightheaded:  No.  Syncope: No. Diplopia:  No. Dyskinesia:  No.  Neuroimaging of the brain has not previously been performed.    PREVIOUS MEDICATIONS: none to date  ALLERGIES:   Allergies  Allergen Reactions   Advair Diskus [Fluticasone-Salmeterol] Other (See Comments)    Causes blisters in her mouth   Codeine     Unknown reaction    CURRENT MEDICATIONS:  Current Outpatient Medications  Medication Instructions   Apoaequorin (PREVAGEN PO) 1 capsule, Oral, Daily, Pt takes medication every other day   B-12  5,000 Units, Oral, Daily, Metabolism support   citalopram (CELEXA) 40 MG tablet Take 1 tablet (40 mg total) by mouth daily.   clonazePAM (KLONOPIN) 0.5 MG tablet TAKE 1/2 TO 1 TABLET BY MOUTH DAILY AS NEEDED.   hydrochlorothiazide (HYDRODIURIL) 25 mg, Oral, Daily   OXYGEN 2 L, Nasal, Daily at bedtime, nasal cannula   potassium chloride (KLOR-CON M) 10 MEQ tablet Take one tablet by mouth twice a day   pravastatin (PRAVACHOL) 20 mg, Oral, Daily   Tiotropium Bromide-Olodaterol (STIOLTO RESPIMAT) 2.5-2.5 MCG/ACT AERS 1 puff, Inhalation, Daily    Objective:   PHYSICAL EXAMINATION:    VITALS:   Vitals:   06/26/21 1336  BP: 100/60  Pulse: 77  SpO2: 94%  Weight: 146 lb 12.8 oz (66.6 kg)  Height: 5' 1.5" (1.562 m)    GEN:  The patient appears stated age and is in NAD. HEENT:  Normocephalic, atraumatic.  The mucous membranes are moist. The superficial temporal arteries are without ropiness or tenderness. CV:  RRR Lungs:  CTAB Neck/HEME:  There are no carotid bruits bilaterally.  Neurological examination:  Orientation: The patient is alert and oriented x3.  Cranial nerves: There is good facial symmetry.  Extraocular muscles are intact. The visual fields are full to confrontational testing. The speech is fluent and clear. Soft palate rises symmetrically and there is no tongue deviation. Hearing is intact to conversational tone. Sensation: Sensation is intact to light touch throughout (facial, trunk, extremities). Vibration is intact at the bilateral ankle. There is no extinction with double simultaneous stimulation.  Motor: Strength is 5/5 in the bilateral upper and lower extremities.   Shoulder shrug is equal and symmetric.  There is no pronator drift. Deep tendon reflexes: Deep tendon reflexes are 2/4 at the bilateral biceps, triceps, brachioradialis, patella and achilles. Plantar responses are downgoing bilaterally.  Movement examination: Tone: There is normal tone in the bilateral  upper extremities.  The tone in the lower extremities is normal.  Abnormal movements: No rest tremor, even with distraction procedures.  No postural tremor.  Very little intention tremor on either side, but slightly on the left.  She has very little trouble with Archimedes spirals on either side.  She is able to pour water from 1 glass to another, with very little tremor. Coordination:  There is no decremation with RAM's, with any form of RAMS, including alternating supination and pronation of the forearm, hand opening and closing, finger taps, heel taps and toe taps. Gait and Station: The patient has no difficulty arising out of a deep-seated chair without the use of the hands. The patient's stride length is good.   I have reviewed and interpreted the following labs independently   Chemistry      Component Value Date/Time   NA 141  06/04/2021 1121   K 3.2 (L) 06/18/2021 1046   CL 99 06/04/2021 1121   CO2 36 (H) 06/04/2021 1121   BUN 7 06/04/2021 1121   CREATININE 0.62 06/04/2021 1121      Component Value Date/Time   CALCIUM 9.4 06/04/2021 1121   ALKPHOS 37 (L) 06/04/2021 1121   AST 21 06/04/2021 1121   ALT 14 06/04/2021 1121   BILITOT 0.5 06/04/2021 1121      Lab Results  Component Value Date   TSH 0.95 06/04/2021   Lab Results  Component Value Date   WBC 10.0 12/19/2020   HGB 14.2 12/19/2020   HCT 44.2 12/19/2020   MCV 96.7 12/19/2020   PLT 231 12/19/2020      Total time spent on today's visit was 64mnutes, including both face-to-face time and nonface-to-face time.  Time included that spent on review of records (prior notes available to me/labs/imaging if pertinent), discussing treatment and goals, answering patient's questions and coordinating care.  Cc:  SEinar Pheasant MD

## 2021-06-23 NOTE — Therapy (Signed)
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 Gery Pray   Hypertension    PONV (postoperative nausea and vomiting)    Scoliosis    Ulcer    Past Surgical History:  Procedure Laterality Date   ABDOMINAL HYSTERECTOMY     ANTERIOR CERVICAL DECOMP/DISCECTOMY FUSION N/A 04/19/2017   Procedure: Anterior Cervical Decompression Fusion Cervical Three-Four, Cervical Four-Five, Cervical Five-Six;  Surgeon: Kary Kos, MD;  Location: [facility];  Service: Neurosurgery;  Laterality: N/A;  Anterior Cervical Decompression Fusion Cervical Three-Four, Cervical Four-Five, Cervical Five-Six   BREAST BIOPSY Right 11/14/2020   [place] Bx, Venus Clip, Path Pending   BREAST LUMPECTOMY WITH RADIOACTIVE SEED AND SENTINEL LYMPH NODE BIOPSY Right 12/27/2020   Procedure: RIGHT BREAST LUMPECTOMY WITH RADIOACTIVE SEED AND AXILLARY SENTINEL LYMPH NODE BIOPSY;  Surgeon: Rolm Bookbinder, MD;  Location: [facility];  Service: General;  Laterality: Right;   [place]   LAPAROSCOPIC REMOVAL OF MESENTERIC MASS     LUMBAR [place]   PARTIAL HYSTERECTOMY  1981   prolapsed uterus   TUBAL LIGATION  1978   Patient Active Problem List   Diagnosis Date  Noted   Ear pain, right 06/13/2021   Tremor 06/07/2021   Hypokalemia 06/07/2021   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female, estrogen receptor positive ([facility]) 12/17/2020   Nasal congestion 02/04/2020   Sore throat 01/25/2020   Neck pain 08/19/2019   B12 deficiency 08/19/2019   Knee pain 02/04/2019   Memory change 02/04/2019   Radiculopathy of cervical spine 04/20/2017   Spondylosis of cervical spine 04/19/2017   Ear pain, left 03/27/2017   Right arm pain 11/12/2016   Vertigo 09/02/2016   Left low back pain 05/22/2015   Left hip pain 05/22/2015   SOB (shortness of breath) 11/11/2014   Right shoulder pain 07/16/2014   Health care maintenance 06/03/2014   Arm skin lesion, left 06/18/2013   URI (upper respiratory infection) 03/03/2013   Hyperglycemia 11/12/2012   Hypercholesterolemia 11/12/2012   Essential hypertension, benign 05/12/2012   GERD (gastroesophageal reflux disease) 05/12/2012   Gastritis 05/12/2012   History of colonic polyps 05/12/2012   Asthma 12/13/2011   Chronic restrictive lung disease 05/11/2011    REFERRING DIAG: right breast cancer at risk for lymphedema  THERAPY DIAG:  Aftercare following surgery for neoplasm  PERTINENT HISTORY: Rt lump with SLNB 12/27/20 with 0/2 LNs with radiation pending and no need for chemotherapy. Hx: asthma, COPD uses O2 at night  PRECAUTIONS:  right UE Lymphedema risk, None  SUBJECTIVE: Pt returns for her 3 month L-Dex screen.   PAIN:  Are you having pain? No  [facility]ZO SCREENING:  Patient was assessed today using the SOZO machine to determine the lymphedema index score. This was compared to her baseline score. It was determined that she is NOT within the recommended range when compared to her baseline and so she was fitted for a compression garment while in the clinic today, instructed how to donn and off this and in proper care of sleeve. It is recommended she return in 1 month to be reassessed. If she  continues to measure outside the recommended range, physical therapy treatment will be recommended at that time and a referral requested.     Otelia Limes, PTA 06/23/2021, 4:10 PM

## 2021-06-26 ENCOUNTER — Ambulatory Visit (INDEPENDENT_AMBULATORY_CARE_PROVIDER_SITE_OTHER): Payer: Medicare Other | Admitting: Neurology

## 2021-06-26 ENCOUNTER — Encounter: Payer: Self-pay | Admitting: Neurology

## 2021-06-26 VITALS — BP 100/60 | HR 77 | Ht 61.5 in | Wt 146.8 lb

## 2021-06-26 DIAGNOSIS — G251 Drug-induced tremor: Secondary | ICD-10-CM | POA: Diagnosis not present

## 2021-06-26 NOTE — Patient Instructions (Addendum)
It was a pleasure to see you today!  I don't see any evidence of Parkinsons Disease today.  Let us know if new symptoms develop or if tremor worsens and you need to be re-evaluated.  You need to call your physician for a refill on the tamoxifen.  The physicians and staff at [facility] are committed to providing excellent care. You may receive a survey requesting feedback about your experience at our office. We strive to receive "very good" responses to the survey questions. If you feel that your experience would prevent you from giving the office a "very good " response, please contact our office to try to remedy the situation. We may be reached at [phone]. Thank you for taking the time out of your busy day to complete the survey.

## 2021-07-01 ENCOUNTER — Other Ambulatory Visit (INDEPENDENT_AMBULATORY_CARE_PROVIDER_SITE_OTHER): Payer: Medicare Other

## 2021-07-01 DIAGNOSIS — E876 Hypokalemia: Secondary | ICD-10-CM

## 2021-07-01 LAB — BASIC METABOLIC PANEL
BUN: 11 mg/dL (ref 6–23)
CO2: 35 mEq/L — ABNORMAL HIGH (ref 19–32)
Calcium: 9.3 mg/dL (ref 8.4–10.5)
Chloride: 100 mEq/L (ref 96–112)
Creatinine, Ser: 0.62 mg/dL (ref 0.40–1.20)
GFR: 87.72 mL/min (ref >60.00)
Glucose, Bld: 90 mg/dL (ref 70–99)
Potassium: 3.8 mEq/L (ref 3.5–5.1)
Sodium: 140 mEq/L (ref 135–145)

## 2021-07-03 ENCOUNTER — Other Ambulatory Visit: Payer: Self-pay | Admitting: Internal Medicine

## 2021-07-04 ENCOUNTER — Ambulatory Visit (INDEPENDENT_AMBULATORY_CARE_PROVIDER_SITE_OTHER): Payer: Medicare Other | Admitting: Internal Medicine

## 2021-07-04 ENCOUNTER — Encounter: Payer: Self-pay | Admitting: Internal Medicine

## 2021-07-04 ENCOUNTER — Telehealth: Payer: Self-pay | Admitting: Internal Medicine

## 2021-07-04 DIAGNOSIS — E7800 Pure hypercholesterolemia, unspecified: Secondary | ICD-10-CM

## 2021-07-04 DIAGNOSIS — Z170 Estrogen receptor positive status [ER+]: Secondary | ICD-10-CM | POA: Diagnosis not present

## 2021-07-04 DIAGNOSIS — I10 Essential (primary) hypertension: Secondary | ICD-10-CM

## 2021-07-04 DIAGNOSIS — J4520 Mild intermittent asthma, uncomplicated: Secondary | ICD-10-CM | POA: Diagnosis not present

## 2021-07-04 DIAGNOSIS — E876 Hypokalemia: Secondary | ICD-10-CM

## 2021-07-04 DIAGNOSIS — R251 Tremor, unspecified: Secondary | ICD-10-CM | POA: Diagnosis not present

## 2021-07-04 DIAGNOSIS — C50411 Malignant neoplasm of upper-outer quadrant of right female breast: Secondary | ICD-10-CM | POA: Diagnosis not present

## 2021-07-04 MED ORDER — CLONAZEPAM 0.5 MG PO TABS: ORAL_TABLET | ORAL | 1 refills | Status: DC

## 2021-07-04 MED ORDER — TRIAMTERENE-HCTZ 37.5-25 MG PO TABS: 1.0000 | ORAL_TABLET | Freq: Every day | ORAL | 2 refills | Status: DC

## 2021-07-04 NOTE — Patient Instructions (Signed)
Stop HCTZ  Stop potassium  Start Triamterene/HCTZ one per day.

## 2021-07-04 NOTE — Telephone Encounter (Signed)
Patient has a lab appt 07/11/21, there are no orders in.

## 2021-07-04 NOTE — Progress Notes (Unsigned)
[name], female   DOB: Aug 01, 1945, 76 y.o.   MRN: [id number]   Subjective:    [name], female    DOB: 1945/05/24, 76 y.o.   MRN: [id number]  This visit occurred during the SARS-CoV-2 public health emergency.  Safety protocols were in place, including screening questions prior to the visit, additional usage of staff PPE, and extensive cleaning of exam room while observing appropriate contact time as indicated for disinfecting solutions.   Patient here for  Chief Complaint  Patient presents with   Follow-up   .   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 Gery Pray   Hypertension    PONV (postoperative nausea and vomiting)    Scoliosis    Ulcer    Past Surgical History:  Procedure Laterality Date   ABDOMINAL HYSTERECTOMY     ANTERIOR CERVICAL DECOMP/DISCECTOMY FUSION N/A 04/19/2017   Procedure: Anterior Cervical Decompression Fusion Cervical Three-Four, Cervical Four-Five, Cervical Five-Six;  Surgeon: Kary Kos, MD;  Location: [facility];  Service: Neurosurgery;  Laterality: N/A;  Anterior Cervical Decompression Fusion Cervical Three-Four, Cervical Four-Five, Cervical Five-Six   BREAST BIOPSY Right 11/14/2020   [place] Bx, Venus Clip, Path Pending   BREAST LUMPECTOMY WITH RADIOACTIVE SEED AND SENTINEL LYMPH NODE BIOPSY Right 12/27/2020   Procedure: RIGHT BREAST LUMPECTOMY WITH RADIOACTIVE SEED AND AXILLARY SENTINEL LYMPH NODE BIOPSY;  Surgeon: Rolm Bookbinder, MD;  Location: [facility];  Service: General;  Laterality: Right;   [place]   LAPAROSCOPIC REMOVAL OF MESENTERIC MASS      LUMBAR [place]   [facility]   PARTIAL HYSTERECTOMY  1981   prolapsed uterus   TUBAL LIGATION  1978   Family History  Problem Relation Age of Onset   Stroke Mother    Cancer Father        unknown type   Cancer Brother        "blood cancer"   CVA Maternal Grandmother    Healthy Son    Breast cancer Neg Hx    Colon cancer Neg Hx    Social History   Socioeconomic History   Marital status: Married    Spouse name: Marchia Bond "[name]"   Number of children: 2   Years of education: Not on file   Highest education level: Not on file  Occupational History   Occupation: retired Pharmacist, hospital  Tobacco Use   Smoking status: Never   Smokeless tobacco: Never  Vaping Use   Vaping Use: Never used  Substance and Sexual Activity   Alcohol use: No    Alcohol/week: 0.0 standard drinks of alcohol   Drug use: No   Sexual activity: Not Currently  Other Topics Concern   Not on file  Social History Narrative   Right handed    Lives with husband   Had breast cancer surgery right side   Worked in the school system    Social Determinants of Health   Financial Resource Strain: Low Risk  (09/10/2020)  Overall Financial Resource Strain (CARDIA)    Difficulty of Paying Living Expenses: Not hard at all  Food Insecurity: No Food Insecurity (09/10/2020)   Hunger Vital Sign    Worried About Running Out of Food in the Last Year: Never true    Ran Out of Food in the Last Year: Never true  Transportation Needs: No Transportation Needs (09/10/2020)   PRAPARE - Hydrologist (Medical): No    Lack of Transportation (Non-Medical): No  Physical Activity: Unknown (09/10/2020)   Exercise Vital Sign    Days of Exercise per Week: 0 days    Minutes of Exercise per Session: Not on file  Stress: No Stress Concern Present (09/10/2020)   [place]    Feeling of Stress : Not at all  Social Connections: Unknown (09/10/2020)    Social Connection and Isolation Panel [NHANES]    Frequency of Communication with Friends and Family: More than three times a week    Frequency of Social Gatherings with Friends and Family: Not on file    Attends Religious Services: Not on file    Active Member of [place] or Organizations: Not on file    Attends Archivist Meetings: Not on file    Marital Status: Married     Review of Systems     Objective:     BP 116/72 (BP Location: Left Arm, Patient Position: Sitting, Cuff Size: Small)   Pulse 74   Temp 98.7 F (37.1 C) (Temporal)   Resp 16   Ht '5\' 1"'$  (1.549 m)   Wt 147 lb (66.7 kg)   SpO2 97%   BMI 27.78 kg/m  Wt Readings from Last 3 Encounters:  07/04/21 147 lb (66.7 kg)  06/26/21 146 lb 12.8 oz (66.6 kg)  06/23/21 148 lb 6 oz (67.3 kg)    Physical Exam   Outpatient Encounter Medications as of 07/04/2021  Medication Sig   Apoaequorin (PREVAGEN PO) Take 1 capsule by mouth daily. Pt takes medication every other day   citalopram (CELEXA) 40 MG tablet Take 1 tablet (40 mg total) by mouth daily.   clonazePAM (KLONOPIN) 0.5 MG tablet TAKE 1/2 TO 1 TABLET BY MOUTH DAILY AS NEEDED.   Cyanocobalamin (B-12) 5000 MCG CAPS Take 5,000 Units by mouth daily. Metabolism support   hydrochlorothiazide (HYDRODIURIL) 25 MG tablet Take 1 tablet (25 mg total) by mouth daily.   OXYGEN Place 2 L into the nose at bedtime. nasal cannula   potassium chloride (KLOR-CON M) 10 MEQ tablet Take one tablet by mouth twice a day   pravastatin (PRAVACHOL) 20 MG tablet Take 1 tablet (20 mg total) by mouth daily.   Tiotropium Bromide-Olodaterol (STIOLTO RESPIMAT) 2.5-2.5 MCG/ACT AERS Inhale 1 puff into the lungs daily.   No facility-administered encounter medications on file as of 07/04/2021.     Lab Results  Component Value Date   WBC 10.0 12/19/2020   HGB 14.2 12/19/2020   HCT 44.2 12/19/2020   PLT 231 12/19/2020   GLUCOSE 90 07/01/2021   CHOL 150 06/04/2021   TRIG 110.0 06/04/2021    HDL 61.70 06/04/2021   LDLDIRECT 148.4 11/03/2012   LDLCALC 66 06/04/2021   ALT 14 06/04/2021   AST 21 06/04/2021   NA 140 07/01/2021   K 3.8 07/01/2021   CL 100 07/01/2021   CREATININE 0.62 07/01/2021   BUN 11 07/01/2021   CO2 35 (H) 07/01/2021   TSH 0.95 06/04/2021  HGBA1C 5.5 06/04/2021       Assessment & Plan:   Problem List Items Addressed This Visit   None    [name], MD

## 2021-07-04 NOTE — Telephone Encounter (Signed)
Rx sent in during appt.

## 2021-07-04 NOTE — Telephone Encounter (Signed)
Pt called in requesting to speak with you... Pt stated that she doesn't remember what medication that Dr. Nicki Reaper wanted her to stop and what medication she needed to start... Pt requesting callback.Jennifer [name]

## 2021-07-04 NOTE — Telephone Encounter (Signed)
S/w pt - confirmed discontinue HCTZ. Confirmed discontinue POTASSIUM after last pill tonight. Confirmed START TRIAMTERENE-HCTZ TOMORROW MORNING.  Pt gave verbal understanding.

## 2021-07-05 NOTE — Addendum Note (Signed)
Addended by: Alisa Graff on: 07/05/2021 07:00 AM   Modules accepted: Orders

## 2021-07-05 NOTE — Telephone Encounter (Signed)
Order placed for f/u lab.   

## 2021-07-06 ENCOUNTER — Encounter: Payer: Self-pay | Admitting: Internal Medicine

## 2021-07-06 NOTE — Assessment & Plan Note (Signed)
Continue spiriva. Breathing stable. 

## 2021-07-06 NOTE — Assessment & Plan Note (Signed)
Blood pressure doing well.  Has been on hctz.  Having to take potassium supplements.  Will stop hctz.  Stop potassium supplements.  Start triam/hctz 37.5/25 q day.  Follow pressures.  Get her back in soon to reassess.  Instructions written for her.

## 2021-07-06 NOTE — Assessment & Plan Note (Signed)
Blood pressure doing well.  Has been on hctz.  Having to take potassium supplements.  Will stop hctz.  Stop potassium supplements.  Start triam/hctz 37.5/25 q day.  Follow pressures.  Get her back in soon to reassess her potassium level.  Instructions written for her.

## 2021-07-06 NOTE — Assessment & Plan Note (Signed)
Recent diagnosis of stage 1A right breast cancer.  S/p right breast lumpectomy and right axillary sentinel node biopsy - 12/27/20. S/p adjuvant radiation therapy.  Seeing oncology.  Had been on tamoxifen.  Concern was contributing to tremor.  Saw neurology. Note reviewed as outlined.  Saw Dr Tat.  No evidence of neurodegenerative process.  No focal or lateralizing findings on exam.  Recommended continuing tamoxifen.  We discussed this today.

## 2021-07-06 NOTE — Assessment & Plan Note (Signed)
On pravastatin.  Low cholesterol diet and exercise.  Follow lipid panel and liver function tests.   

## 2021-07-06 NOTE — Assessment & Plan Note (Signed)
Recently saw Dr Tat for evaluation of tremors.  No evidence of neurodegenerative process.  No focal or lateralizing findings on exam.  Recommended continuing tamoxifen.  We discussed this today.

## 2021-07-11 ENCOUNTER — Other Ambulatory Visit (INDEPENDENT_AMBULATORY_CARE_PROVIDER_SITE_OTHER): Payer: Medicare Other

## 2021-07-11 DIAGNOSIS — E876 Hypokalemia: Secondary | ICD-10-CM | POA: Diagnosis not present

## 2021-07-11 LAB — BASIC METABOLIC PANEL
BUN: 6 mg/dL (ref 6–23)
CO2: 34 mEq/L — ABNORMAL HIGH (ref 19–32)
Calcium: 9.2 mg/dL (ref 8.4–10.5)
Chloride: 101 mEq/L (ref 96–112)
Creatinine, Ser: 0.61 mg/dL (ref 0.40–1.20)
GFR: 88.05 mL/min (ref >60.00)
Glucose, Bld: 83 mg/dL (ref 70–99)
Potassium: 3.9 mEq/L (ref 3.5–5.1)
Sodium: 140 mEq/L (ref 135–145)

## 2021-07-14 DIAGNOSIS — L570 Actinic keratosis: Secondary | ICD-10-CM | POA: Diagnosis not present

## 2021-07-14 DIAGNOSIS — L578 Other skin changes due to chronic exposure to nonionizing radiation: Secondary | ICD-10-CM | POA: Diagnosis not present

## 2021-07-14 DIAGNOSIS — Z872 Personal history of diseases of the skin and subcutaneous tissue: Secondary | ICD-10-CM | POA: Diagnosis not present

## 2021-07-14 DIAGNOSIS — J449 Chronic obstructive pulmonary disease, unspecified: Secondary | ICD-10-CM | POA: Diagnosis not present

## 2021-07-14 DIAGNOSIS — L918 Other hypertrophic disorders of the skin: Secondary | ICD-10-CM | POA: Diagnosis not present

## 2021-07-14 DIAGNOSIS — Z86018 Personal history of other benign neoplasm: Secondary | ICD-10-CM | POA: Diagnosis not present

## 2021-07-14 DIAGNOSIS — Z859 Personal history of malignant neoplasm, unspecified: Secondary | ICD-10-CM | POA: Diagnosis not present

## 2021-07-14 DIAGNOSIS — L720 Epidermal cyst: Secondary | ICD-10-CM | POA: Diagnosis not present

## 2021-07-21 ENCOUNTER — Ambulatory Visit: Payer: Medicare Other | Attending: General Surgery

## 2021-07-21 VITALS — Wt 145.2 lb

## 2021-07-21 DIAGNOSIS — Z483 Aftercare following surgery for neoplasm: Secondary | ICD-10-CM | POA: Insufficient documentation

## 2021-07-21 NOTE — Therapy (Signed)
OUTPATIENT PHYSICAL THERAPY SOZO SCREENING NOTE   Patient Name: Jennifer [name] MRN: [id number] DOB:October 05, 1945, 76 y.o., female Today's Date: 07/21/2021  PCP: [name], MD REFERRING PROVIDER: [name], MD   PT End of Session - 07/21/21 1057     Visit Number 2   # unchanged due to screen only   PT Start Time 1056    PT Stop Time 1104    PT Time Calculation (min) 8 min    Activity Tolerance Patient tolerated treatment well    Behavior During Therapy WFL for tasks assessed/performed             Past Medical History:  Diagnosis Date   Anxiety    panic attacks   Arthritis    Asthma    Bronchitis    COPD (chronic obstructive pulmonary disease) ([place])    Depression    Dyspnea    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 Gery Pray   Hypertension    PONV (postoperative nausea and vomiting)    Scoliosis    Ulcer    Past Surgical History:  Procedure Laterality Date   ABDOMINAL HYSTERECTOMY     ANTERIOR CERVICAL DECOMP/DISCECTOMY FUSION N/A 04/19/2017   Procedure: Anterior Cervical Decompression Fusion Cervical Three-Four, Cervical Four-Five, Cervical Five-Six;  Surgeon: Kary Kos, MD;  Location: [facility];  Service: Neurosurgery;  Laterality: N/A;  Anterior Cervical Decompression Fusion Cervical Three-Four, Cervical Four-Five, Cervical Five-Six   BREAST BIOPSY Right 11/14/2020   [place] Bx, Venus Clip, Path Pending   BREAST LUMPECTOMY WITH RADIOACTIVE SEED AND SENTINEL LYMPH NODE BIOPSY Right 12/27/2020   Procedure: RIGHT BREAST LUMPECTOMY WITH RADIOACTIVE SEED AND AXILLARY SENTINEL LYMPH NODE BIOPSY;  Surgeon: Rolm Bookbinder, MD;  Location: [facility];  Service: General;  Laterality: Right;   [place]   LAPAROSCOPIC REMOVAL OF MESENTERIC MASS     LUMBAR [place]   PARTIAL  HYSTERECTOMY  1981   prolapsed uterus   TUBAL LIGATION  1978   Patient Active Problem List   Diagnosis Date Noted   Ear pain, right 06/13/2021   Tremor 06/07/2021   Hypokalemia 06/07/2021   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female, estrogen receptor positive ([place]) 12/17/2020   Nasal congestion 02/04/2020   Sore throat 01/25/2020   Neck pain 08/19/2019   B12 deficiency 08/19/2019   Knee pain 02/04/2019   Memory change 02/04/2019   Radiculopathy of cervical spine 04/20/2017   Spondylosis of cervical spine 04/19/2017   Ear pain, left 03/27/2017   Right arm pain 11/12/2016   Vertigo 09/02/2016   Left low back pain 05/22/2015   Left hip pain 05/22/2015   SOB (shortness of breath) 11/11/2014   Right shoulder pain 07/16/2014   Health care maintenance 06/03/2014   Arm skin lesion, left 06/18/2013   URI (upper respiratory infection) 03/03/2013   Hyperglycemia 11/12/2012   Hypercholesterolemia 11/12/2012   Essential hypertension, benign 05/12/2012   GERD (gastroesophageal reflux disease) 05/12/2012   Gastritis 05/12/2012   History of colonic polyps 05/12/2012   Asthma 12/13/2011   Chronic restrictive lung disease 05/11/2011    REFERRING DIAG: right breast cancer at risk for lymphedema  THERAPY DIAG:  Aftercare following surgery for neoplasm  PERTINENT HISTORY:  Rt lump with SLNB 12/27/20  with 0/2 LNs with radiation pending and no need for chemotherapy. Hx: asthma, COPD uses O2 at night  PRECAUTIONS: right UE Lymphedema risk, None  SUBJECTIVE: Pt returns for her 1 month reassess L-Dex screen after having a higher change from baseline.   PAIN:  Are you having pain? No  SOZO SCREENING: Patient was assessed today using the SOZO machine to determine the lymphedema index score. This was compared to her baseline score. It was determined that she is within the recommended range when compared to her baseline and no further action is needed at this time.  She will continue SOZO screenings. These are done every 3 months for 2 years post operatively followed by every 6 months for 2 years, and then annually.    Otelia Limes, PTA 07/21/2021, 11:04 AM

## 2021-07-23 ENCOUNTER — Other Ambulatory Visit: Payer: Self-pay | Admitting: *Deleted

## 2021-07-23 DIAGNOSIS — E876 Hypokalemia: Secondary | ICD-10-CM

## 2021-07-30 ENCOUNTER — Telehealth: Payer: Self-pay | Admitting: Hematology

## 2021-07-30 NOTE — Telephone Encounter (Signed)
Rescheduled upcoming appointment due to provider on call. Patient is aware of changes. 

## 2021-07-31 ENCOUNTER — Other Ambulatory Visit (INDEPENDENT_AMBULATORY_CARE_PROVIDER_SITE_OTHER): Payer: Medicare Other

## 2021-07-31 DIAGNOSIS — E876 Hypokalemia: Secondary | ICD-10-CM | POA: Diagnosis not present

## 2021-07-31 LAB — BASIC METABOLIC PANEL
BUN: 10 mg/dL (ref 6–23)
CO2: 34 mEq/L — ABNORMAL HIGH (ref 19–32)
Calcium: 9.3 mg/dL (ref 8.4–10.5)
Chloride: 98 mEq/L (ref 96–112)
Creatinine, Ser: 0.76 mg/dL (ref 0.40–1.20)
GFR: 77.14 mL/min (ref >60.00)
Glucose, Bld: 89 mg/dL (ref 70–99)
Potassium: 3.4 mEq/L — ABNORMAL LOW (ref 3.5–5.1)
Sodium: 139 mEq/L (ref 135–145)

## 2021-08-01 ENCOUNTER — Other Ambulatory Visit: Payer: Self-pay

## 2021-08-01 DIAGNOSIS — E876 Hypokalemia: Secondary | ICD-10-CM

## 2021-08-04 ENCOUNTER — Ambulatory Visit: Payer: Medicare Other

## 2021-08-04 ENCOUNTER — Telehealth: Payer: Self-pay | Admitting: Internal Medicine

## 2021-08-04 NOTE — Telephone Encounter (Signed)
S/w pt - stated she was confused about when she was supposed to come in for labs. Pt advised we scheduled her an appointment for 7/28 for rpt potassium. Also advised pt that high potassium food list was mailed to her.

## 2021-08-04 NOTE — Telephone Encounter (Signed)
Pt called in stating that she spoke with you about her potassium levels... Pt stated that Dr. Nicki Reaper advised her that she needs to come in and do labs again.... Pt stated that she can come in today or tomorrow to do labs but she will be out of town on Wednesday... Pt stated that Dr. Nicki Reaper advised her that she will give her a list of things she can eat to help with her potassium... Pt has an appt on 08/15/2021 at 10am... Pt requesting callback

## 2021-08-13 DIAGNOSIS — J449 Chronic obstructive pulmonary disease, unspecified: Secondary | ICD-10-CM | POA: Diagnosis not present

## 2021-08-15 ENCOUNTER — Other Ambulatory Visit (INDEPENDENT_AMBULATORY_CARE_PROVIDER_SITE_OTHER): Payer: Medicare Other

## 2021-08-15 DIAGNOSIS — E876 Hypokalemia: Secondary | ICD-10-CM | POA: Diagnosis not present

## 2021-08-15 LAB — POTASSIUM: Potassium: 3.8 mEq/L (ref 3.5–5.1)

## 2021-08-19 ENCOUNTER — Other Ambulatory Visit: Payer: Self-pay | Admitting: Internal Medicine

## 2021-08-19 ENCOUNTER — Other Ambulatory Visit: Payer: Self-pay

## 2021-08-19 DIAGNOSIS — C50411 Malignant neoplasm of upper-outer quadrant of right female breast: Secondary | ICD-10-CM

## 2021-08-19 NOTE — Telephone Encounter (Signed)
Pt advised of results. 

## 2021-08-19 NOTE — Telephone Encounter (Signed)
Patient states she can't get MyChart and she would like to know the results of her lab work.

## 2021-08-21 ENCOUNTER — Encounter: Payer: Self-pay | Admitting: Hematology

## 2021-08-21 ENCOUNTER — Other Ambulatory Visit: Payer: Self-pay

## 2021-08-21 ENCOUNTER — Inpatient Hospital Stay: Payer: Medicare Other | Attending: Radiation Oncology

## 2021-08-21 ENCOUNTER — Inpatient Hospital Stay (HOSPITAL_BASED_OUTPATIENT_CLINIC_OR_DEPARTMENT_OTHER): Payer: Medicare Other | Admitting: Hematology

## 2021-08-21 VITALS — BP 127/64 | HR 88 | Temp 98.5°F | Resp 18 | Wt 144.0 lb

## 2021-08-21 DIAGNOSIS — Z79810 Long term (current) use of selective estrogen receptor modulators (SERMs): Secondary | ICD-10-CM | POA: Diagnosis not present

## 2021-08-21 DIAGNOSIS — C50411 Malignant neoplasm of upper-outer quadrant of right female breast: Secondary | ICD-10-CM | POA: Diagnosis not present

## 2021-08-21 DIAGNOSIS — Z170 Estrogen receptor positive status [ER+]: Secondary | ICD-10-CM

## 2021-08-21 DIAGNOSIS — M8580 Other specified disorders of bone density and structure, unspecified site: Secondary | ICD-10-CM | POA: Insufficient documentation

## 2021-08-21 LAB — CBC WITH DIFFERENTIAL (CANCER CENTER ONLY)
Abs Immature Granulocytes: 0.02 10*3/uL (ref 0.00–0.07)
Basophils Absolute: 0.1 10*3/uL (ref 0.0–0.1)
Basophils Relative: 1 %
Eosinophils Absolute: 0.4 10*3/uL (ref 0.0–0.5)
Eosinophils Relative: 5 %
HCT: 41 % (ref 36.0–46.0)
Hemoglobin: 14.2 g/dL (ref 12.0–15.0)
Immature Granulocytes: 0 %
Lymphocytes Relative: 28 %
Lymphs Abs: 2.1 10*3/uL (ref 0.7–4.0)
MCH: 32.5 pg (ref 26.0–34.0)
MCHC: 34.6 g/dL (ref 30.0–36.0)
MCV: 93.8 fL (ref 80.0–100.0)
Monocytes Absolute: 0.7 10*3/uL (ref 0.1–1.0)
Monocytes Relative: 9 %
Neutro Abs: 4.5 10*3/uL (ref 1.7–7.7)
Neutrophils Relative %: 57 %
Platelet Count: 222 10*3/uL (ref 150–400)
RBC: 4.37 MIL/uL (ref 3.87–5.11)
RDW: 12.1 % (ref 11.5–15.5)
WBC Count: 7.8 10*3/uL (ref 4.0–10.5)
nRBC: 0 % (ref 0.0–0.2)

## 2021-08-21 LAB — CMP (CANCER CENTER ONLY)
ALT: 18 U/L (ref 0–44)
AST: 26 U/L (ref 15–41)
Albumin: 4.3 g/dL (ref 3.5–5.0)
Alkaline Phosphatase: 52 U/L (ref 38–126)
Anion gap: 5 (ref 5–15)
BUN: 12 mg/dL (ref 8–23)
CO2: 36 mmol/L — ABNORMAL HIGH (ref 22–32)
Calcium: 9.5 mg/dL (ref 8.9–10.3)
Chloride: 100 mmol/L (ref 98–111)
Creatinine: 0.87 mg/dL (ref 0.44–1.00)
GFR, Estimated: >60 mL/min (ref >60)
Glucose, Bld: 100 mg/dL — ABNORMAL HIGH (ref 70–99)
Potassium: 3.3 mmol/L — ABNORMAL LOW (ref 3.5–5.1)
Sodium: 141 mmol/L (ref 135–145)
Total Bilirubin: 0.5 mg/dL (ref 0.3–1.2)
Total Protein: 6.9 g/dL (ref 6.5–8.1)

## 2021-08-21 MED ORDER — TAMOXIFEN CITRATE 20 MG PO TABS: 20.0000 mg | ORAL_TABLET | Freq: Every day | ORAL | 1 refills | Status: DC

## 2021-08-21 NOTE — Progress Notes (Signed)
[place]   [phone] [phone]) [phone]   Clinic Follow up Note   Patient Care Team: [name], MD as PCP - General (Internal Medicine) Gery Pray, MD as Consulting Physician (Radiation Oncology) Truitt Merle, MD as Consulting Physician (Hematology and Oncology) Rolm Bookbinder, MD as Consulting Physician (General Surgery) Tat, Eustace Quail, DO as Consulting Physician (Neurology)  Date of Service:  08/21/2021  CHIEF COMPLAINT: f/u of right breast cancer  CURRENT THERAPY:  Tamoxifen, starting 03/19/21  ASSESSMENT & PLAN:  Jennifer [name] is a 76 y.o. post-hysterectomy female with   1. Malignant neoplasm of upper-outer quadrant of right breast, Stage IA, p(T1c, N0), ER+/PR+/HER2-, Grade 2, Oncotype RS 3 -found on screening mammogram. S/p right lumpectomy on 12/27/20 with Dr. Donne Hazel, path showed 1.2 cm IDC, margins and lymph nodes negative. Oncotype RS of 3, low risk. S/p radiation 02/03/21 - 02/28/21 under Dr. Sondra Come. -she started tamoxifen in 03/2021, but she states she never requested additional refills. Presumably she took for 4 months and has been off for 2. I will refill today for her to restart. -she is clinically doing well from a breast cancer standpoint. Labs reviewed, overall WNL. Physical exam was unremarkable. There is no clinical concern for recurrence.  -she will be due for annual mammogram in 10/2021; I ordered today. -f/u in 7 months   2. Osteopenia -Her most recent DEXA was 08/17/16 showing osteopenia (T-score -1.1).  -I again encouraged her to get a repeated DEXA scan and ordered this today.   3. HTN, depression, arthritis  -continue medication     PLAN:  -restart tamoxifen, called in today  -mammogram and DEXA in 10/2021 -f/u with Dr. Donne Hazel in 3-4 months -Lab and f/u with NP in 7 months    No problem-specific Assessment & Plan notes found for this encounter.   SUMMARY OF ONCOLOGIC HISTORY: Oncology History Overview  Note   Cancer Staging  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female, estrogen receptor positive ([place]) Staging form: Breast, AJCC 8th Edition - Clinical stage from 11/14/2020: Stage IA (cT1c, cN0, cM0, G2, ER+, PR+, HER2-) - Signed by Truitt Merle, MD on 12/17/2020 Stage prefix: Initial diagnosis Histologic grading system: 3 grade system - Pathologic stage from 12/27/2020: Stage IA (pT1c, pN0, cM0, G2, ER+, PR+, HER2-) - Signed by Truitt Merle, MD on 02/25/2021 Stage prefix: Initial diagnosis Histologic grading system: 3 grade system Residual tumor (R): R0 - None     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female, estrogen receptor positive ([place])  11/04/2020 Mammogram   EXAM: DIGITAL DIAGNOSTIC UNILATERAL RIGHT MAMMOGRAM WITH TOMOSYNTHESIS AND CAD; ULTRASOUND RIGHT BREAST LIMITED  FINDINGS: Targeted ultrasound is performed, showing irregular hypoechoic mass at the right breast 11 o'clock 5 cm from nipple measuring 1 x 0.5 x 1.2 cm correlating to the mammographic mass. Ultrasound of the right axilla is negative.   11/14/2020 Cancer Staging   Staging form: Breast, AJCC 8th Edition - Clinical stage from 11/14/2020: Stage IA (cT1c, cN0, cM0, G2, ER+, PR+, HER2-) - Signed by Truitt Merle, MD on 12/17/2020 Stage prefix: Initial diagnosis Histologic grading system: 3 grade system   11/14/2020 Pathology Results   DIAGNOSIS:  A. BREAST, RIGHT AT 11:00, 5 CM FROM THE NIPPLE; ULTRASOUND-GUIDED CORE NEEDLE BIOPSY:  - INVASIVE MAMMARY CARCINOMA, NO SPECIAL TYPE.  Histologic grade of invasive carcinoma: Grade 2    ADDENDUM:  CASE SUMMARY: BREAST BIOMARKER TESTS  Estrogen Receptor (ER) Status: POSITIVE  Percentage of cells with nuclear positivity: >90%          Average intensity of staining: Strong   Progesterone Receptor (PgR) Status: POSITIVE          Percentage of cells with nuclear positivity: >90%          Average intensity of staining: Strong    HER2 (by immunohistochemistry): NEGATIVE (Score 1+)  Ki-67: Not performed    12/17/2020 Initial Diagnosis   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female, estrogen receptor positive ([place])   12/27/2020 Cancer Staging   Staging form: Breast, AJCC 8th Edition - Pathologic stage from 12/27/2020: Stage IA (pT1c, pN0, cM0, G2, ER+, PR+, HER2-) - Signed by Truitt Merle, MD on 02/25/2021 Stage prefix: Initial diagnosis Histologic grading system: 3 grade system Residual tumor (R): R0 - None   12/27/2020 Surgery   Right lumpectomy: IDC g2, 1.2cm, margins negative, 2 SLN negative   02/03/2021 - 02/28/2021 Radiation Therapy   Site Technique Total Dose (Gy) Dose per Fx (Gy) Completed Fx Beam Energies  Breast, Right: Breast_R 3D 40.05/40.05 2.67 15/15 6XFFF  Breast, Right: Breast_R_Bst 3D 10/10 2 5/5 6X     03/19/2021 -  Anti-estrogen oral therapy   20 mg Tamoxifen      INTERVAL HISTORY:  [name] is here for a follow up of breast cancer. She was last seen by me on 02/25/21 with survivorship in the interim. She presents to the clinic accompanied by her husband. She tells me today she is not taking the tamoxifen. She explains "there were no refills, so I didn't do anything with it!" Chart review shows I ordered three refills, to total 4 months worth of tamoxifen, so it's unclear how much she took. Survivorship visit note says she was taking tamoxifen at that time, but pt's memory is not good. She reports she developed some tremors. She notes there was no difference since she's been off tamoxifen.   All other systems were reviewed with the patient and are negative.  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 Gery Pray   Hypertension    PONV (postoperative nausea and vomiting)    Scoliosis    Ulcer     SURGICAL HISTORY: Past Surgical History:  Procedure Laterality Date   ABDOMINAL HYSTERECTOMY     ANTERIOR CERVICAL DECOMP/DISCECTOMY FUSION N/A 04/19/2017   Procedure: Anterior Cervical Decompression Fusion Cervical Three-Four, Cervical Four-Five, Cervical Five-Six;  Surgeon: Kary Kos, MD;  Location: [place];  Service: Neurosurgery;  Laterality: N/A;  Anterior Cervical Decompression Fusion Cervical Three-Four, Cervical Four-Five, Cervical Five-Six   BREAST BIOPSY Right 11/14/2020   [place] Bx, Venus Clip, Path Pending   BREAST LUMPECTOMY WITH RADIOACTIVE SEED AND SENTINEL LYMPH NODE BIOPSY Right 12/27/2020   Procedure: RIGHT BREAST LUMPECTOMY WITH RADIOACTIVE SEED AND AXILLARY SENTINEL LYMPH NODE BIOPSY;  Surgeon: Rolm Bookbinder, MD;  Location: [place];  Service: General;  Laterality: Right;   [place] OF MESENTERIC MASS  [place]   PARTIAL HYSTERECTOMY  1981   prolapsed uterus   TUBAL LIGATION  1978    I have reviewed the social history and family history with the patient and they are unchanged from previous note.  ALLERGIES:  is allergic to advair diskus [fluticasone-salmeterol] and codeine.  MEDICATIONS:  Current Outpatient Medications  Medication Sig Dispense Refill   tamoxifen (NOLVADEX) 20 MG tablet Take 1 tablet (20 mg total) by mouth daily. 90 tablet 1   Apoaequorin (PREVAGEN PO) Take 1 capsule by mouth daily. Pt takes medication every other day     citalopram (CELEXA) 40 MG tablet Take 1 tablet (40 mg total) by mouth daily. 90 tablet 1   clonazePAM (KLONOPIN) 0.5 MG tablet TAKE 1/2 TO 1 TABLET BY MOUTH DAILY AS NEEDED. 30 tablet 1   Cyanocobalamin (B-12) 5000 MCG CAPS Take 5,000 Units by mouth daily.  Metabolism support     OXYGEN Place 2 L into the nose at bedtime. nasal cannula     pravastatin (PRAVACHOL) 20 MG tablet Take 1 tablet (20 mg total) by mouth daily. 90 tablet 0   Tiotropium Bromide-Olodaterol (STIOLTO RESPIMAT) 2.5-2.5 MCG/ACT AERS Inhale 1 puff into the lungs daily.     triamterene-hydrochlorothiazide (MAXZIDE-25) 37.5-25 MG tablet Take 1 tablet by mouth daily. 30 tablet 2   No current facility-administered medications for this visit.    PHYSICAL EXAMINATION: ECOG PERFORMANCE STATUS: 0 - Asymptomatic  Vitals:   08/21/21 1427  BP: 127/64  Pulse: 88  Resp: 18  Temp: 98.5 F (36.9 C)  SpO2: 92%   Wt Readings from Last 3 Encounters:  08/21/21 144 lb (65.3 kg)  07/21/21 145 lb 4 oz (65.9 kg)  07/04/21 147 lb (66.7 kg)     GENERAL:alert, no distress and comfortable SKIN: skin color, texture, turgor are normal, no rashes or significant lesions EYES: normal, Conjunctiva are pink and non-injected, sclera clear  NECK: supple, thyroid normal size, non-tender, without nodularity LYMPH:  no palpable lymphadenopathy in the cervical, axillary LUNGS: clear to auscultation and percussion with normal breathing effort HEART: regular rate & rhythm and no murmurs and no lower extremity edema ABDOMEN:abdomen soft, non-tender and normal bowel sounds Musculoskeletal:no cyanosis of digits and no clubbing  NEURO: alert & oriented x 3 with fluent speech, no focal motor/sensory deficits BREAST: No palpable mass, nodules or adenopathy bilaterally. Breast exam benign.   LABORATORY DATA:  I have reviewed the data as listed    Latest Ref Rng & Units 08/21/2021    1:55 PM 12/19/2020    2:30 PM 08/13/2020   10:03 AM  CBC  WBC 4.0 - 10.5 K/uL 7.8  10.0  8.3   Hemoglobin 12.0 - 15.0 g/dL 14.2  14.2  14.3   Hematocrit 36.0 - 46.0 % 41.0  44.2  42.9   Platelets 150 - 400 K/uL 222  231  263.0         Latest Ref Rng & Units 08/21/2021    1:55 PM 08/15/2021    9:59 AM 07/31/2021   11:17 AM   CMP  Glucose 70 - 99 mg/dL 100   89   BUN 8 - 23 mg/dL 12   10   Creatinine 0.44 - 1.00 mg/dL 0.87   0.76   Sodium 135 - 145 mmol/L 141   139   Potassium 3.5 - 5.1 mmol/L 3.3  3.8  3.4   Chloride 98 - 111 mmol/L 100   98  CO2 22 - 32 mmol/L 36   34   Calcium 8.9 - 10.3 mg/dL 9.5   9.3   Total Protein 6.5 - 8.1 g/dL 6.9     Total Bilirubin 0.3 - 1.2 mg/dL 0.5     Alkaline Phos 38 - 126 U/L 52     AST 15 - 41 U/L 26     ALT 0 - 44 U/L 18         RADIOGRAPHIC STUDIES: I have personally reviewed the radiological images as listed and agreed with the findings in the report. No results found.    Orders Placed This Encounter  Procedures   DG Bone Density    Standing Status:   Future    Standing Expiration Date:   08/21/2022    Order Specific Question:   Reason for Exam (SYMPTOM  OR DIAGNOSIS REQUIRED)    Answer:   screening    Order Specific Question:   Preferred imaging location?    Answer:   [facility]   All questions were answered. The patient knows to call the clinic with any problems, questions or concerns. No barriers to learning was detected. The total time spent in the appointment was 30 minutes.     Truitt Merle, MD 08/21/2021   I, Wilburn Mylar, am acting as scribe for Truitt Merle, MD.   I have reviewed the above documentation for accuracy and completeness, and I agree with the above.

## 2021-08-22 ENCOUNTER — Ambulatory Visit: Payer: Medicare Other | Admitting: Hematology

## 2021-08-22 ENCOUNTER — Encounter: Payer: Self-pay | Admitting: Internal Medicine

## 2021-08-22 ENCOUNTER — Other Ambulatory Visit: Payer: Self-pay | Admitting: Internal Medicine

## 2021-08-22 ENCOUNTER — Other Ambulatory Visit: Payer: Medicare Other

## 2021-08-22 DIAGNOSIS — M8580 Other specified disorders of bone density and structure, unspecified site: Secondary | ICD-10-CM | POA: Insufficient documentation

## 2021-08-22 DIAGNOSIS — Z853 Personal history of malignant neoplasm of breast: Secondary | ICD-10-CM

## 2021-09-08 DIAGNOSIS — J4530 Mild persistent asthma, uncomplicated: Secondary | ICD-10-CM | POA: Diagnosis not present

## 2021-09-09 ENCOUNTER — Telehealth: Payer: Self-pay | Admitting: Internal Medicine

## 2021-09-09 NOTE — Telephone Encounter (Signed)
Copied from [place] [id number]. Topic: Medicare AWV >> Sep 09, 2021  2:44 PM Devoria Glassing wrote: Reason for CRM: Left message for patient to schedule Annual Wellness Visit.  Please schedule with Nurse Health Advisor Denisa O'Brien-Blaney, LPN at [facility]. This appt can be telephone or office visit.  Please call [phone] ask for [facility]

## 2021-09-09 NOTE — Telephone Encounter (Signed)
Patient states she missed our call.  I read Juliann Pulse Harris-Coley's message to patient and gave her Rockie Neighbours number so she can give her a call.

## 2021-09-11 ENCOUNTER — Telehealth: Payer: Self-pay | Admitting: Internal Medicine

## 2021-09-13 DIAGNOSIS — J449 Chronic obstructive pulmonary disease, unspecified: Secondary | ICD-10-CM | POA: Diagnosis not present

## 2021-09-16 ENCOUNTER — Telehealth: Payer: Self-pay

## 2021-09-16 NOTE — Telephone Encounter (Signed)
Pt called stating that she thinks the Tamoxifen is making her sick.  Pt stated that she feels shaky, nauseous, tired, and is loosing weight.  Pt would like to speak with Dr. Burr Medico or Cira Rue, NP regarding my Tamoxifen.  Sent Patient call message to Dr. Burr Medico and Cira Rue, NP.

## 2021-10-06 ENCOUNTER — Other Ambulatory Visit: Payer: Self-pay | Admitting: Internal Medicine

## 2021-10-07 ENCOUNTER — Other Ambulatory Visit: Payer: Self-pay | Admitting: Internal Medicine

## 2021-10-07 ENCOUNTER — Encounter: Payer: Self-pay | Admitting: Internal Medicine

## 2021-10-07 ENCOUNTER — Ambulatory Visit (INDEPENDENT_AMBULATORY_CARE_PROVIDER_SITE_OTHER): Payer: Medicare Other | Admitting: Internal Medicine

## 2021-10-07 ENCOUNTER — Other Ambulatory Visit: Payer: Self-pay

## 2021-10-07 VITALS — BP 122/70 | HR 82 | Temp 98.3°F | Ht 61.5 in | Wt 138.4 lb

## 2021-10-07 DIAGNOSIS — I10 Essential (primary) hypertension: Secondary | ICD-10-CM

## 2021-10-07 DIAGNOSIS — J4520 Mild intermittent asthma, uncomplicated: Secondary | ICD-10-CM | POA: Diagnosis not present

## 2021-10-07 DIAGNOSIS — R739 Hyperglycemia, unspecified: Secondary | ICD-10-CM | POA: Diagnosis not present

## 2021-10-07 DIAGNOSIS — E876 Hypokalemia: Secondary | ICD-10-CM | POA: Diagnosis not present

## 2021-10-07 DIAGNOSIS — R630 Anorexia: Secondary | ICD-10-CM

## 2021-10-07 DIAGNOSIS — C50411 Malignant neoplasm of upper-outer quadrant of right female breast: Secondary | ICD-10-CM

## 2021-10-07 DIAGNOSIS — J984 Other disorders of lung: Secondary | ICD-10-CM | POA: Diagnosis not present

## 2021-10-07 DIAGNOSIS — E7800 Pure hypercholesterolemia, unspecified: Secondary | ICD-10-CM | POA: Diagnosis not present

## 2021-10-07 DIAGNOSIS — K2100 Gastro-esophageal reflux disease with esophagitis, without bleeding: Secondary | ICD-10-CM | POA: Diagnosis not present

## 2021-10-07 DIAGNOSIS — Z23 Encounter for immunization: Secondary | ICD-10-CM | POA: Diagnosis not present

## 2021-10-07 DIAGNOSIS — Z170 Estrogen receptor positive status [ER+]: Secondary | ICD-10-CM

## 2021-10-07 DIAGNOSIS — Z86010 Personal history of colonic polyps: Secondary | ICD-10-CM | POA: Diagnosis not present

## 2021-10-07 LAB — HEPATIC FUNCTION PANEL
ALT: 12 U/L (ref 0–35)
AST: 21 U/L (ref 0–37)
Albumin: 4 g/dL (ref 3.5–5.2)
Alkaline Phosphatase: 39 U/L (ref 39–117)
Bilirubin, Direct: 0.1 mg/dL (ref 0.0–0.3)
Total Bilirubin: 0.6 mg/dL (ref 0.2–1.2)
Total Protein: 6.6 g/dL (ref 6.0–8.3)

## 2021-10-07 LAB — BASIC METABOLIC PANEL
BUN: 11 mg/dL (ref 6–23)
CO2: 33 mEq/L — ABNORMAL HIGH (ref 19–32)
Calcium: 9.4 mg/dL (ref 8.4–10.5)
Chloride: 99 mEq/L (ref 96–112)
Creatinine, Ser: 0.83 mg/dL (ref 0.40–1.20)
GFR: 69.31 mL/min (ref >60.00)
Glucose, Bld: 94 mg/dL (ref 70–99)
Potassium: 3 mEq/L — ABNORMAL LOW (ref 3.5–5.1)
Sodium: 139 mEq/L (ref 135–145)

## 2021-10-07 LAB — CBC WITH DIFFERENTIAL/PLATELET
Basophils Absolute: 0.1 10*3/uL (ref 0.0–0.1)
Basophils Relative: 1 % (ref 0.0–3.0)
Eosinophils Absolute: 0.3 10*3/uL (ref 0.0–0.7)
Eosinophils Relative: 3.9 % (ref 0.0–5.0)
HCT: 39.3 % (ref 36.0–46.0)
Hemoglobin: 13.5 g/dL (ref 12.0–15.0)
Lymphocytes Relative: 25.6 % (ref 12.0–46.0)
Lymphs Abs: 2 10*3/uL (ref 0.7–4.0)
MCHC: 34.5 g/dL (ref 30.0–36.0)
MCV: 93.2 fl (ref 78.0–100.0)
Monocytes Absolute: 0.6 10*3/uL (ref 0.1–1.0)
Monocytes Relative: 7.7 % (ref 3.0–12.0)
Neutro Abs: 4.9 10*3/uL (ref 1.4–7.7)
Neutrophils Relative %: 61.8 % (ref 43.0–77.0)
Platelets: 193 10*3/uL (ref 150.0–400.0)
RBC: 4.21 Mil/uL (ref 3.87–5.11)
RDW: 12.8 % (ref 11.5–15.5)
WBC: 7.9 10*3/uL (ref 4.0–10.5)

## 2021-10-07 LAB — LIPASE: Lipase: 16 U/L (ref 11.0–59.0)

## 2021-10-07 MED ORDER — POTASSIUM CHLORIDE CRYS ER 10 MEQ PO TBCR: EXTENDED_RELEASE_TABLET | ORAL | 0 refills | Status: DC

## 2021-10-07 NOTE — Progress Notes (Signed)
Patient ID: [name], female   DOB: 1945-08-27, 76 y.o.   MRN: [id number]   Subjective:    Patient ID: [name], female    DOB: 1945/11/07, 76 y.o.   MRN: [id number]   Patient here for  Chief Complaint  Patient presents with   Follow-up    4 month follow up   .   HPI Here to follow up regarding blood pressure and GERD.  Sees pulmonary regarding her asthma with chronic obstruction and kyphoscoliosis - restrictive lung disease.  Using stiolto.  Breathing stable.  No increased cough or congestion.  Last seen 09/08/21 - no changes made.  Sees oncology f/u breast cancer.  Last seen 08/21/21.  Was not on tamoxifen.  Had some perceived intolerance - stopped.  Recommended restart.  Scheduled for f/u mammogram 10/2021.  No chest pain.  Bowels moving.  Handling stress relatively well.     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 Gery Pray   Hypertension    PONV (postoperative nausea and vomiting)    Scoliosis    Ulcer    Past Surgical History:  Procedure Laterality Date   ABDOMINAL HYSTERECTOMY     ANTERIOR CERVICAL DECOMP/DISCECTOMY FUSION N/A 04/19/2017   Procedure: Anterior Cervical Decompression Fusion Cervical Three-Four, Cervical Four-Five, Cervical Five-Six;  Surgeon: Kary Kos, MD;  Location: [place];  Service: Neurosurgery;  Laterality: N/A;  Anterior Cervical Decompression Fusion Cervical Three-Four, Cervical Four-Five, Cervical Five-Six   BREAST BIOPSY Right 11/14/2020   [place] Bx, Venus Clip, Path Pending   BREAST LUMPECTOMY WITH RADIOACTIVE SEED AND SENTINEL LYMPH NODE BIOPSY Right 12/27/2020   Procedure: RIGHT BREAST LUMPECTOMY WITH RADIOACTIVE SEED AND AXILLARY  SENTINEL LYMPH NODE BIOPSY;  Surgeon: Rolm Bookbinder, MD;  Location: [place];  Service: General;  Laterality: Right;   [place]   LAPAROSCOPIC REMOVAL OF MESENTERIC MASS     LUMBAR [place]   [facility]   PARTIAL HYSTERECTOMY  1981   prolapsed uterus   TUBAL LIGATION  1978   Family History  Problem Relation Age of Onset   Stroke Mother    Cancer Father        unknown type   Cancer Brother        "blood cancer"   CVA Maternal Grandmother    Healthy Son    Breast cancer Neg Hx    Colon cancer Neg Hx    Social History   Socioeconomic History   Marital status: Married    Spouse name: Marchia Bond "[name]"   Number of children: 2   Years of education: Not on file   Highest education level: Not on file  Occupational History   Occupation: retired Pharmacist, hospital  Tobacco Use   Smoking status: Never   Smokeless tobacco: Never  Vaping Use   Vaping Use: Never used  Substance and Sexual Activity   Alcohol use: No    Alcohol/week: 0.0 standard drinks of alcohol   Drug use: No   Sexual activity: Not Currently  Other Topics Concern  Not on file  Social History Narrative   Right handed    Lives with husband   Had breast cancer surgery right side   Worked in the school system    Social Determinants of Health   Financial Resource Strain: Low Risk  (09/10/2020)   Overall Financial Resource Strain (CARDIA)    Difficulty of Paying Living Expenses: Not hard at all  Food Insecurity: No Food Insecurity (09/10/2020)   Hunger Vital Sign    Worried About Running Out of Food in the Last Year: Never true    [place] in the Last Year: Never true  Transportation Needs: No Transportation Needs (09/10/2020)   PRAPARE - Hydrologist (Medical): No    Lack of Transportation (Non-Medical): No  Physical Activity: Unknown (09/10/2020)   Exercise Vital Sign    Days of Exercise per Week: 0 days    Minutes of  Exercise per Session: Not on file  Stress: No Stress Concern Present (09/10/2020)   [place]    Feeling of Stress : Not at all  Social Connections: Unknown (09/10/2020)   Social Connection and Isolation Panel [NHANES]    Frequency of Communication with Friends and Family: More than three times a week    Frequency of Social Gatherings with Friends and Family: Not on file    Attends Religious Services: Not on file    Active Member of Clubs or Organizations: Not on file    Attends Archivist Meetings: Not on file    Marital Status: Married     Review of Systems  Constitutional:  Negative for appetite change and unexpected weight change.  HENT:  Negative for congestion and sinus pressure.   Respiratory:  Negative for cough, chest tightness and shortness of breath.   Cardiovascular:  Negative for chest pain, palpitations and leg swelling.  Gastrointestinal:  Negative for abdominal pain, diarrhea, nausea and vomiting.  Genitourinary:  Negative for difficulty urinating and dysuria.  Musculoskeletal:  Negative for joint swelling and myalgias.  Skin:  Negative for color change and rash.  Neurological:  Negative for dizziness, light-headedness and headaches.  Psychiatric/Behavioral:  Negative for agitation and dysphoric mood.        Objective:     BP 122/70 (BP Location: Left Arm, Patient Position: Sitting, Cuff Size: Normal)   Pulse 82   Temp 98.3 F (36.8 C) (Oral)   Ht 5' 1.5" (1.562 m)   Wt 138 lb 6.4 oz (62.8 kg)   SpO2 98%   BMI 25.73 kg/m  Wt Readings from Last 3 Encounters:  10/07/21 138 lb 6.4 oz (62.8 kg)  08/21/21 144 lb (65.3 kg)  07/21/21 145 lb 4 oz (65.9 kg)    Physical Exam Vitals reviewed.  Constitutional:      General: She is not in acute distress.    Appearance: Normal appearance.  HENT:     Head: Normocephalic and atraumatic.     Right Ear: External ear normal.     Left Ear:  External ear normal.  Eyes:     General: No scleral icterus.       Right eye: No discharge.        Left eye: No discharge.     Conjunctiva/sclera: Conjunctivae normal.  Neck:     Thyroid: No thyromegaly.  Cardiovascular:     Rate and Rhythm: Normal rate and regular rhythm.  Pulmonary:     Effort: No respiratory  distress.     Breath sounds: Normal breath sounds. No wheezing.  Abdominal:     General: Bowel sounds are normal.     Palpations: Abdomen is soft.     Tenderness: There is no abdominal tenderness.  Musculoskeletal:        General: No swelling or tenderness.     Cervical back: Neck supple. No tenderness.  Lymphadenopathy:     Cervical: No cervical adenopathy.  Skin:    Findings: No erythema or rash.  Neurological:     Mental Status: She is alert.  Psychiatric:        Mood and Affect: Mood normal.        Behavior: Behavior normal.      Outpatient Encounter Medications as of 10/07/2021  Medication Sig   Apoaequorin (PREVAGEN PO) Take 1 capsule by mouth daily. Pt takes medication every other day   citalopram (CELEXA) 40 MG tablet Take 1 tablet (40 mg total) by mouth daily.   clonazePAM (KLONOPIN) 0.5 MG tablet TAKE 1/2 TO 1 TABLET BY MOUTH DAILY AS NEEDED.   Cyanocobalamin (B-12) 5000 MCG CAPS Take 5,000 Units by mouth daily. Metabolism support   OXYGEN Place 2 L into the nose at bedtime. nasal cannula   pravastatin (PRAVACHOL) 20 MG tablet Take 1 tablet (20 mg total) by mouth daily.   tamoxifen (NOLVADEX) 20 MG tablet Take 1 tablet (20 mg total) by mouth daily.   Tiotropium Bromide-Olodaterol (STIOLTO RESPIMAT) 2.5-2.5 MCG/ACT AERS Inhale 1 puff into the lungs daily.   [DISCONTINUED] triamterene-hydrochlorothiazide (MAXZIDE-25) 37.5-25 MG tablet Take 1 tablet by mouth daily.   No facility-administered encounter medications on file as of 10/07/2021.     Lab Results  Component Value Date   WBC 7.9 10/07/2021   HGB 13.5 10/07/2021   HCT 39.3 10/07/2021   PLT 193.0  10/07/2021   GLUCOSE 94 10/07/2021   CHOL 150 06/04/2021   TRIG 110.0 06/04/2021   HDL 61.70 06/04/2021   LDLDIRECT 148.4 11/03/2012   LDLCALC 66 06/04/2021   ALT 12 10/07/2021   AST 21 10/07/2021   NA 139 10/07/2021   K 3.0 (L) 10/07/2021   CL 99 10/07/2021   CREATININE 0.83 10/07/2021   BUN 11 10/07/2021   CO2 33 (H) 10/07/2021   TSH 0.95 06/04/2021   HGBA1C 5.5 06/04/2021       Assessment & Plan:   Problem List Items Addressed This Visit     Asthma    Continue stiolto.  Breathing doing well.  Follow       Chronic restrictive lung disease    Breathing stable.        Decreased appetite   Relevant Orders   CBC with Differential/Platelet (Completed)   Hepatic function panel (Completed)   Basic metabolic panel (Completed)   Lipase (Completed)   Essential hypertension, benign    Blood pressure as outlined.  On triam/hctz.  Follow pressures.  Follow metabolic panel.       GERD (gastroesophageal reflux disease)    History of gastritis and varices.  On PPI.  F/u with GI.         History of colonic polyps    Colonoscopy 02/2016.       Hypercholesterolemia    On pravastatin.  Low cholesterol diet and exercise.  Follow lipid panel and liver function tests.        Hyperglycemia    Low carb diet and exercise.  Follow met b and a1c.       Hypokalemia  On triam/hctz.  Recheck potassium.       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female, estrogen receptor positive ([place])    Diagnosed with stage 1A right breast cancer.  S/p right breast lumpectomy and right axillary sentinel node biopsy - 12/27/20. S/p adjuvant radiation therapy.  Seeing oncology.  Recommend restart tamoxifen.        Other Visit Diagnoses     Need for immunization against influenza    -  Primary   Relevant Orders   Flu Vaccine QUAD High Dose(Fluad) (Completed)        [name], MD

## 2021-10-07 NOTE — Telephone Encounter (Signed)
Rx ok'd sent in for potassium rx

## 2021-10-07 NOTE — Telephone Encounter (Signed)
-----   Message from [name], MD sent at 10/07/2021  5:12 PM EDT ----- Notify potassium is low.  Have her take kcl 53mq bid x 2 days and then continue kcl - one po q day.  Recheck potassium in one week.  Pancreas test wnl.  White blood cell count, hgb, kidney function and liver function tests ok.

## 2021-10-07 NOTE — Telephone Encounter (Signed)
Pended the potassium not sure of the quantity to send in.

## 2021-10-09 ENCOUNTER — Telehealth: Payer: Self-pay | Admitting: Internal Medicine

## 2021-10-09 DIAGNOSIS — E876 Hypokalemia: Secondary | ICD-10-CM

## 2021-10-09 NOTE — Telephone Encounter (Signed)
Patient has a lab appt 10/15/21, there are no orders in.

## 2021-10-10 NOTE — Telephone Encounter (Signed)
Order placed for f/u lab.   

## 2021-10-10 NOTE — Addendum Note (Signed)
Addended by: Alisa Graff on: 10/10/2021 03:46 AM   Modules accepted: Orders

## 2021-10-11 ENCOUNTER — Other Ambulatory Visit: Payer: Self-pay | Admitting: Internal Medicine

## 2021-10-12 ENCOUNTER — Encounter: Payer: Self-pay | Admitting: Internal Medicine

## 2021-10-12 NOTE — Assessment & Plan Note (Signed)
Diagnosed with stage 1A right breast cancer.  S/p right breast lumpectomy and right axillary sentinel node biopsy - 12/27/20. S/p adjuvant radiation therapy.  Seeing oncology.  Recommend restart tamoxifen.

## 2021-10-12 NOTE — Assessment & Plan Note (Signed)
Continue stiolto.  Breathing doing well.  Follow  

## 2021-10-12 NOTE — Assessment & Plan Note (Signed)
On triam/hctz.  Recheck potassium.

## 2021-10-12 NOTE — Assessment & Plan Note (Signed)
Blood pressure as outlined.  On triam/hctz.  Follow pressures.  Follow metabolic panel.  

## 2021-10-12 NOTE — Assessment & Plan Note (Signed)
Breathing stable.

## 2021-10-12 NOTE — Assessment & Plan Note (Signed)
Colonoscopy 02/2016.  

## 2021-10-12 NOTE — Assessment & Plan Note (Signed)
History of gastritis and varices.  On PPI.  F/u with GI.

## 2021-10-12 NOTE — Assessment & Plan Note (Signed)
On pravastatin.  Low cholesterol diet and exercise.  Follow lipid panel and liver function tests.   

## 2021-10-12 NOTE — Assessment & Plan Note (Signed)
Low carb diet and exercise.  Follow met b and a1c.  

## 2021-10-13 NOTE — Telephone Encounter (Signed)
Rx ok;d for clonazepam #30 with one refill.  

## 2021-10-14 DIAGNOSIS — J449 Chronic obstructive pulmonary disease, unspecified: Secondary | ICD-10-CM | POA: Diagnosis not present

## 2021-10-15 ENCOUNTER — Other Ambulatory Visit (INDEPENDENT_AMBULATORY_CARE_PROVIDER_SITE_OTHER): Payer: Medicare Other

## 2021-10-15 ENCOUNTER — Telehealth: Payer: Self-pay | Admitting: Internal Medicine

## 2021-10-15 DIAGNOSIS — E876 Hypokalemia: Secondary | ICD-10-CM

## 2021-10-15 NOTE — Telephone Encounter (Signed)
She was wanting to restart medication Tamoxifen sooner than Sunday & was asking for you to discuss with Dr. Burr Medico.  She said when she saw you last week that she thought you had mentioned you would reach out to Dr. Burr Medico? I am happy to schedule patient to discuss? Did you want to advise on an opening for patient?

## 2021-10-15 NOTE — Telephone Encounter (Signed)
Please notify - Dr Burr Medico is ok with her restarting tamoxifen - start 1/2 tablet per day.

## 2021-10-15 NOTE — Telephone Encounter (Signed)
After hanging up patient did call back & wanted to do a work in appointment for her anxiety. Could you advise on what day you wanted to try to work her in? She said that she was completely open to whenever except she couldn't on 10/6.

## 2021-10-15 NOTE — Telephone Encounter (Signed)
Pt notified & she was willing to come on 10/4 at 10:30.

## 2021-10-15 NOTE — Telephone Encounter (Signed)
I am not sure if I fully understand this message.  She sees Dr Burr Medico - her oncologist and per her last note, she wanted her to restart tamoxifen.  Is she not wanting to restart or is she wanting to go ahead and start.  Also, regarding her medication, will need appt to discuss and see about change.

## 2021-10-15 NOTE — Telephone Encounter (Signed)
Pt called & stated that he anxiety is very high & klonopin not helping. She really wanted to know if you were willing to reach our to Dr. Burr Medico to see if she should start back Tamoxifen sooner than Sunday. She is so concerned that she is losing so much weight & has about 10 lbs. She cannot eat little bits. She said that she is supposed to start Tamoxifen back Sunday  night at 1/2 tablet. Anything I can do to help facilitate this?

## 2021-10-15 NOTE — Telephone Encounter (Signed)
Pt called stating she would like a call back to discuss her medication

## 2021-10-15 NOTE — Telephone Encounter (Signed)
Please call and notify Jennifer [name] that I have contacted Dr Burr Medico and am waiting to hear back.  This should not be a problem for her to restart, but I will clarify with her.  If needs appt to discuss anxiety medication - can hold for work in appt.

## 2021-10-15 NOTE — Telephone Encounter (Signed)
I can work her in at 10:30 on 10/22/21

## 2021-10-15 NOTE — Telephone Encounter (Signed)
Pt notified that Dr. Nicki Reaper did speak with Dr. Burr Medico & she will restart Tamoxifen today 1/2 tablet tonight. Depending on how she feels when she starts back medication she will let us know so appointment can be made for anxiety if needed.

## 2021-10-16 LAB — POTASSIUM: Potassium: 3.9 mEq/L (ref 3.5–5.1)

## 2021-10-17 ENCOUNTER — Other Ambulatory Visit: Payer: Self-pay

## 2021-10-17 DIAGNOSIS — E876 Hypokalemia: Secondary | ICD-10-CM

## 2021-10-22 ENCOUNTER — Other Ambulatory Visit: Payer: Medicare Other

## 2021-10-22 ENCOUNTER — Ambulatory Visit: Payer: Medicare Other | Admitting: Internal Medicine

## 2021-10-22 ENCOUNTER — Other Ambulatory Visit (INDEPENDENT_AMBULATORY_CARE_PROVIDER_SITE_OTHER): Payer: Medicare Other

## 2021-10-22 DIAGNOSIS — E876 Hypokalemia: Secondary | ICD-10-CM | POA: Diagnosis not present

## 2021-10-22 LAB — POTASSIUM: Potassium: 3.4 mEq/L — ABNORMAL LOW (ref 3.5–5.1)

## 2021-10-23 ENCOUNTER — Other Ambulatory Visit: Payer: Self-pay

## 2021-10-23 ENCOUNTER — Encounter: Payer: Self-pay | Admitting: Internal Medicine

## 2021-10-23 DIAGNOSIS — E876 Hypokalemia: Secondary | ICD-10-CM

## 2021-10-23 MED ORDER — POTASSIUM CHLORIDE CRYS ER 10 MEQ PO TBCR: EXTENDED_RELEASE_TABLET | ORAL | 0 refills | Status: DC

## 2021-10-23 NOTE — Progress Notes (Signed)
Appt was canceled (changed).  Pt was not seen.

## 2021-10-27 ENCOUNTER — Other Ambulatory Visit: Payer: Medicare Other

## 2021-10-27 ENCOUNTER — Inpatient Hospital Stay: Admission: RE | Admit: 2021-10-27 | Payer: Medicare Other | Source: Ambulatory Visit

## 2021-10-29 ENCOUNTER — Ambulatory Visit (INDEPENDENT_AMBULATORY_CARE_PROVIDER_SITE_OTHER): Payer: Medicare Other | Admitting: Internal Medicine

## 2021-10-29 ENCOUNTER — Encounter: Payer: Self-pay | Admitting: Internal Medicine

## 2021-10-29 DIAGNOSIS — C50411 Malignant neoplasm of upper-outer quadrant of right female breast: Secondary | ICD-10-CM | POA: Diagnosis not present

## 2021-10-29 DIAGNOSIS — K2100 Gastro-esophageal reflux disease with esophagitis, without bleeding: Secondary | ICD-10-CM | POA: Diagnosis not present

## 2021-10-29 DIAGNOSIS — J984 Other disorders of lung: Secondary | ICD-10-CM | POA: Diagnosis not present

## 2021-10-29 DIAGNOSIS — I10 Essential (primary) hypertension: Secondary | ICD-10-CM

## 2021-10-29 DIAGNOSIS — Z170 Estrogen receptor positive status [ER+]: Secondary | ICD-10-CM

## 2021-10-29 DIAGNOSIS — J4520 Mild intermittent asthma, uncomplicated: Secondary | ICD-10-CM

## 2021-10-29 DIAGNOSIS — R739 Hyperglycemia, unspecified: Secondary | ICD-10-CM | POA: Diagnosis not present

## 2021-10-29 DIAGNOSIS — F419 Anxiety disorder, unspecified: Secondary | ICD-10-CM

## 2021-10-29 DIAGNOSIS — R630 Anorexia: Secondary | ICD-10-CM

## 2021-10-29 DIAGNOSIS — E7800 Pure hypercholesterolemia, unspecified: Secondary | ICD-10-CM

## 2021-10-29 NOTE — Progress Notes (Signed)
[name], female   DOB: 07-02-1945, 76 y.o.   MRN: [id number]   Subjective:    [name], female    DOB: 01-01-1946, 76 y.o.   MRN: [id number]   Patient here for  Chief Complaint  Patient presents with   Follow-up    Follow up anxiety   .   HPI Work in - increased anxiety and weight loss. Has had issues with stress and anxiety.  Has ben on citalopram and clonazepam.  Does not feel clonazepam is working as well now.  Discussed desire to get her off clonazepam.  She is not eating as much.  Decreased appetite.  Feels shaky - inside. No chest pain.  Breathing stable.  Currently taking clonazepam 1/2 tablet bid.     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 Gery Pray   Hypertension    PONV (postoperative nausea and vomiting)    Scoliosis    Ulcer    Past Surgical History:  Procedure Laterality Date   ABDOMINAL HYSTERECTOMY     ANTERIOR CERVICAL DECOMP/DISCECTOMY FUSION N/A 04/19/2017   Procedure: Anterior Cervical Decompression Fusion Cervical Three-Four, Cervical Four-Five, Cervical Five-Six;  Surgeon: Kary Kos, MD;  Location: [place];  Service: Neurosurgery;  Laterality: N/A;  Anterior Cervical Decompression Fusion Cervical Three-Four, Cervical Four-Five, Cervical Five-Six   BREAST BIOPSY Right 11/14/2020   [place] Bx, Venus Clip, Path Pending   BREAST LUMPECTOMY WITH RADIOACTIVE SEED AND SENTINEL LYMPH NODE BIOPSY Right 12/27/2020   Procedure: RIGHT BREAST LUMPECTOMY WITH RADIOACTIVE SEED AND AXILLARY SENTINEL LYMPH NODE BIOPSY;  Surgeon: Rolm Bookbinder, MD;  Location: [place];  Service: General;  Laterality: Right;   [place]   LAPAROSCOPIC REMOVAL OF MESENTERIC MASS     LUMBAR [place]   [facility]   PARTIAL HYSTERECTOMY  1981   prolapsed uterus   TUBAL LIGATION  1978   Family History  Problem Relation Age of Onset   Stroke Mother    Cancer Father        unknown type   Cancer Brother        "blood cancer"   CVA Maternal Grandmother    Healthy Son    Breast cancer Neg Hx    Colon cancer Neg Hx    Social History   Socioeconomic History   Marital status: Married    Spouse name: Marchia Bond "[name]"   Number of children: 2   Years of education: Not on file   Highest education level: Not on file  Occupational History   Occupation: retired Pharmacist, hospital  Tobacco Use   Smoking status: Never   Smokeless tobacco: Never  Vaping Use   Vaping Use: Never used  Substance and Sexual Activity   Alcohol use: No    Alcohol/week: 0.0 standard drinks of alcohol   Drug use: No   Sexual activity: Not Currently  Other Topics Concern   Not on file  Social History Narrative   Right handed    Lives with husband  Had breast cancer surgery right side   Worked in the school system    Social Determinants of Health   Financial Resource Strain: Low Risk  (09/10/2020)   Overall Financial Resource Strain (CARDIA)    Difficulty of Paying Living Expenses: Not hard at all  Food Insecurity: No Food Insecurity (09/10/2020)   Hunger Vital Sign    Worried About Running Out of Food in the Last Year: Never true    Ran Out of Food in the Last Year: Never true  Transportation Needs: No Transportation Needs (09/10/2020)   PRAPARE - Transportation    Lack of Transportation (Medical): No    Lack of Transportation (Non-Medical): No  Physical Activity: Unknown (09/10/2020)   Exercise Vital Sign    Days of Exercise per Week: 0 days    Minutes of Exercise per Session: Not on file  Stress: No Stress Concern Present (09/10/2020)   [employer] - Occupational Stress  Questionnaire    Feeling of Stress : Not at all  Social Connections: Unknown (09/10/2020)   Social Connection and Isolation Panel [NHANES]    Frequency of Communication with Friends and Family: More than three times a week    Frequency of Social Gatherings with Friends and Family: Not on file    Attends Religious Services: Not on file    Active Member of Clubs or Organizations: Not on file    Attends Club or Organization Meetings: Not on file    Marital Status: Married     Review of Systems  Constitutional:  Negative for appetite change and unexpected weight change.  HENT:  Negative for congestion and sinus pressure.   Respiratory:  Negative for cough, chest tightness and shortness of breath.   Cardiovascular:  Negative for chest pain, palpitations and leg swelling.  Gastrointestinal:  Negative for abdominal pain, diarrhea, nausea and vomiting.  Genitourinary:  Negative for difficulty urinating and dysuria.  Musculoskeletal:  Negative for joint swelling and myalgias.  Skin:  Negative for color change and rash.  Neurological:  Negative for dizziness and headaches.  Psychiatric/Behavioral:  Negative for agitation and dysphoric mood.        Increased stress and anxiety as outlined.         Objective:     BP 110/60 (BP Location: Left Arm, Patient Position: Sitting, Cuff Size: Normal)   Pulse 81   Temp 98.8 F (37.1 C) (Oral)   Ht 5' 1" (1.549 m)   Wt 137 lb 9.6 oz (62.4 kg)   SpO2 95%   BMI 26.00 kg/m  Wt Readings from Last 3 Encounters:  10/29/21 137 lb 9.6 oz (62.4 kg)  10/07/21 138 lb 6.4 oz (62.8 kg)  08/21/21 144 lb (65.3 kg)    Physical Exam Vitals reviewed.  Constitutional:      General: She is not in acute distress.    Appearance: Normal appearance.  HENT:     Head: Normocephalic and atraumatic.     Right Ear: External ear normal.     Left Ear: External ear normal.  Eyes:     General: No scleral icterus.       Right eye: No discharge.        Left eye: No  discharge.     Conjunctiva/sclera: Conjunctivae normal.  Neck:     Thyroid: No thyromegaly.  Cardiovascular:     Rate and Rhythm: Normal rate and regular rhythm.  Pulmonary:     Effort: No respiratory distress.     Breath   sounds: Normal breath sounds. No wheezing.  Abdominal:     General: Bowel sounds are normal.     Palpations: Abdomen is soft.     Tenderness: There is no abdominal tenderness.  Musculoskeletal:        General: No swelling or tenderness.     Cervical back: Neck supple. No tenderness.  Lymphadenopathy:     Cervical: No cervical adenopathy.  Skin:    Findings: No erythema or rash.  Neurological:     Mental Status: She is alert.  Psychiatric:        Mood and Affect: Mood normal.        Behavior: Behavior normal.     Outpatient Encounter Medications as of 10/29/2021  Medication Sig   Apoaequorin (PREVAGEN PO) Take 1 capsule by mouth daily. Pt takes medication every other day   citalopram (CELEXA) 40 MG tablet Take 1 tablet (40 mg total) by mouth daily.   clonazePAM (KLONOPIN) 0.5 MG tablet TAKE 1/2 TO 1 TABLET BY MOUTH DAILY AS NEEDED.   Cyanocobalamin (B-12) 5000 MCG CAPS Take 5,000 Units by mouth daily. Metabolism support   OXYGEN Place 2 L into the nose at bedtime. nasal cannula   potassium chloride (KLOR-CON M) 10 MEQ tablet Take one tablet by mouth twice daily.   pravastatin (PRAVACHOL) 20 MG tablet Take 1 tablet (20 mg total) by mouth daily.   tamoxifen (NOLVADEX) 20 MG tablet Take 1 tablet (20 mg total) by mouth daily.   Tiotropium Bromide-Olodaterol (STIOLTO RESPIMAT) 2.5-2.5 MCG/ACT AERS Inhale 1 puff into the lungs daily.   triamterene-hydrochlorothiazide (MAXZIDE-25) 37.5-25 MG tablet Take 1 tablet by mouth daily.   No facility-administered encounter medications on file as of 10/29/2021.     Lab Results  Component Value Date   WBC 7.9 10/07/2021   HGB 13.5 10/07/2021   HCT 39.3 10/07/2021   PLT 193.0 10/07/2021   GLUCOSE 94 10/07/2021   CHOL  150 06/04/2021   TRIG 110.0 06/04/2021   HDL 61.70 06/04/2021   LDLDIRECT 148.4 11/03/2012   LDLCALC 66 06/04/2021   ALT 12 10/07/2021   AST 21 10/07/2021   NA 139 10/07/2021   K 3.4 (L) 10/22/2021   CL 99 10/07/2021   CREATININE 0.83 10/07/2021   BUN 11 10/07/2021   CO2 33 (H) 10/07/2021   TSH 0.95 06/04/2021   HGBA1C 5.5 06/04/2021       Assessment & Plan:   Problem List Items Addressed This Visit     Anxiety    Increased stress and anxiety as outlined.  On citalopram.  Continue current dose for now.  We will decrease clonazepam to 1/2 tablet in the morning only.  We will do this dose for the next week and then stop.  Start Remeron 7.5 mg nightly.  Follow.      Asthma    Continue stiolto.  Breathing doing well.  Follow       Chronic restrictive lung disease    Breathing stable.       Decreased appetite    With associated increased stress. Taper off clonazepam.  Trial of remeron.  Follow.        Essential hypertension, benign    Blood pressure as outlined.  On triam/hctz.  Follow pressures.  Follow metabolic panel.       GERD (gastroesophageal reflux disease)    No upper symptoms reported.  Continue PPI.       Hypercholesterolemia    On pravastatin.  Low cholesterol diet and exercise.    Follow lipid panel and liver function tests.        Hyperglycemia    Low carb diet and exercise.  Follow met b and a1c.       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female, estrogen receptor positive (HCC)    Recent diagnosis of stage 1A right breast cancer.  S/p right breast lumpectomy and right axillary sentinel node biopsy - 12/27/20. S/p adjuvant radiation therapy.  Seeing oncology.  Had been on tamoxifen.  Concern was contributing to tremor.  Saw neurology. Note reviewed as outlined.  Saw Dr Tat.  No evidence of neurodegenerative process.  No focal or lateralizing findings on exam.  Recommended continuing tamoxifen.  She was taken off tamoxifen recently  given concern over intolerance.  Was asked to remain off for a short period and then restart 1/2 tablet.  Back on now 1/2 tablet.  Appears to be tolerating.  Increased stress related to above.          , MD  

## 2021-10-31 ENCOUNTER — Telehealth: Payer: Self-pay | Admitting: Internal Medicine

## 2021-10-31 MED ORDER — MIRTAZAPINE 7.5 MG PO TABS: 7.5000 mg | ORAL_TABLET | Freq: Every day | ORAL | 1 refills | Status: DC

## 2021-10-31 NOTE — Telephone Encounter (Signed)
Pt called stating the provider was going to talk someone about her medication because she was having tremors

## 2021-10-31 NOTE — Telephone Encounter (Signed)
Rx sent in for remerton.  Discussed

## 2021-11-01 NOTE — Telephone Encounter (Signed)
Late entry. Called and spoke to Jennifer [name] 10/31/21 (1630).  Discussed changing medications.  She feels clonazepam is not helping.  Would like to taper off clonazepam.  Will decrease clonazepam to .'5mg'$  q am x 1 week and then stop.  Start remeron 7.'5mg'$  q hs.  Conitnue citalopram as she is doing now.  May be able to decrease citalopram pending response.  Pt agreeable and wrote down directions as outlined above.

## 2021-11-02 ENCOUNTER — Encounter: Payer: Self-pay | Admitting: Internal Medicine

## 2021-11-02 DIAGNOSIS — F419 Anxiety disorder, unspecified: Secondary | ICD-10-CM | POA: Insufficient documentation

## 2021-11-02 NOTE — Assessment & Plan Note (Signed)
Increased stress and anxiety as outlined.  On citalopram.  Continue current dose for now.  We will decrease clonazepam to 1/2 tablet in the morning only.  We will do this dose for the next week and then stop.  Start Remeron 7.5 mg nightly.  Follow.

## 2021-11-02 NOTE — Assessment & Plan Note (Signed)
Breathing stable.

## 2021-11-02 NOTE — Assessment & Plan Note (Signed)
With associated increased stress. Taper off clonazepam.  Trial of remeron.  Follow.

## 2021-11-02 NOTE — Assessment & Plan Note (Signed)
Blood pressure as outlined.  On triam/hctz.  Follow pressures.  Follow metabolic panel.  

## 2021-11-02 NOTE — Assessment & Plan Note (Signed)
Recent diagnosis of stage 1A right breast cancer.  S/p right breast lumpectomy and right axillary sentinel node biopsy - 12/27/20. S/p adjuvant radiation therapy.  Seeing oncology.  Had been on tamoxifen.  Concern was contributing to tremor.  Saw neurology. Note reviewed as outlined.  Saw Dr Tat.  No evidence of neurodegenerative process.  No focal or lateralizing findings on exam.  Recommended continuing tamoxifen.  She was taken off tamoxifen recently given concern over intolerance.  Was asked to remain off for a short period and then restart 1/2 tablet.  Back on now 1/2 tablet.  Appears to be tolerating.  Increased stress related to above.  

## 2021-11-02 NOTE — Assessment & Plan Note (Signed)
No upper symptoms reported.  Continue PPI.  

## 2021-11-02 NOTE — Assessment & Plan Note (Signed)
Low carb diet and exercise.  Follow met b and a1c.  

## 2021-11-02 NOTE — Assessment & Plan Note (Signed)
Continue stiolto.  Breathing doing well.  Follow  

## 2021-11-02 NOTE — Assessment & Plan Note (Signed)
On pravastatin.  Low cholesterol diet and exercise.  Follow lipid panel and liver function tests.   

## 2021-11-03 ENCOUNTER — Other Ambulatory Visit (INDEPENDENT_AMBULATORY_CARE_PROVIDER_SITE_OTHER): Payer: Medicare Other

## 2021-11-03 DIAGNOSIS — E876 Hypokalemia: Secondary | ICD-10-CM | POA: Diagnosis not present

## 2021-11-03 LAB — POTASSIUM: Potassium: 3.6 mEq/L (ref 3.5–5.1)

## 2021-11-04 ENCOUNTER — Telehealth: Payer: Self-pay

## 2021-11-04 NOTE — Telephone Encounter (Signed)
Patient states she would like for [place] to call her with her lab results.  Patient states we may leave a detailed message on her answering machine if she does not answer.

## 2021-11-10 ENCOUNTER — Telehealth: Payer: Self-pay

## 2021-11-10 ENCOUNTER — Ambulatory Visit: Payer: Medicare Other

## 2021-11-10 ENCOUNTER — Telehealth (INDEPENDENT_AMBULATORY_CARE_PROVIDER_SITE_OTHER): Payer: Medicare Other | Admitting: Internal Medicine

## 2021-11-10 ENCOUNTER — Encounter: Payer: Self-pay | Admitting: Internal Medicine

## 2021-11-10 DIAGNOSIS — Z170 Estrogen receptor positive status [ER+]: Secondary | ICD-10-CM | POA: Diagnosis not present

## 2021-11-10 DIAGNOSIS — F419 Anxiety disorder, unspecified: Secondary | ICD-10-CM | POA: Diagnosis not present

## 2021-11-10 DIAGNOSIS — I10 Essential (primary) hypertension: Secondary | ICD-10-CM | POA: Diagnosis not present

## 2021-11-10 DIAGNOSIS — C50411 Malignant neoplasm of upper-outer quadrant of right female breast: Secondary | ICD-10-CM

## 2021-11-10 NOTE — Progress Notes (Signed)
[name], female   DOB: [birth year], 76 y.o.   MRN: [id number]   Virtual Visit via video Note   All issues noted in this document were discussed and addressed.  No physical exam was performed (except for noted visual exam findings with Video Visits).   I connected with Jennifer [name] by a video enabled telemedicine application and verified that I am speaking with the correct person using two identifiers. Location patient: home Location provider: work  Persons participating in the virtual visit: patient, provider  The limitations, risks, security and privacy concerns of performing an evaluation and management service by video and the availability of in person appointments. Have been discussed.  It has also been discussed with the patient that there may be a patient responsible charge related to this service. The patient expressed understanding and agreed to proceed.   Reason for visit: work in appt  HPI: Work in to discuss medications.  Had questions regarding her medications.  Recently had informed me that she did not feel her clonazepam was working.  She was started on remeron with instructions to taper clonazepam off.  She was also informed to continue citalopram.  On questioning her she was having trouble telling me exactly when and what medications she was taking.  She reported she thinks she has skipped her citalopram for a few days.  Is feeling shaky inside.  Discussed this could be contributing.  Taking remeron at night and sleeping well.  Off clonazepam.  Eating.  No vomiting.  Breathing stable.    ROS: See pertinent positives and negatives per HPI.  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 Gery Pray   Hypertension    PONV (postoperative nausea and vomiting)    Scoliosis    Ulcer     Past Surgical History:  Procedure Laterality Date   ABDOMINAL HYSTERECTOMY     ANTERIOR CERVICAL DECOMP/DISCECTOMY FUSION N/A 04/19/2017   Procedure: Anterior Cervical Decompression Fusion Cervical Three-Four, Cervical Four-Five, Cervical Five-Six;  Surgeon: Kary Kos, MD;  Location: [facility];  Service: Neurosurgery;  Laterality: N/A;  Anterior Cervical Decompression Fusion Cervical Three-Four, Cervical Four-Five, Cervical Five-Six   BREAST BIOPSY Right 11/14/2020   [place] Bx, Venus Clip, Path Pending   BREAST LUMPECTOMY WITH RADIOACTIVE SEED AND SENTINEL LYMPH NODE BIOPSY Right 12/27/2020   Procedure: RIGHT BREAST LUMPECTOMY WITH RADIOACTIVE SEED AND AXILLARY SENTINEL LYMPH NODE BIOPSY;  Surgeon: Rolm Bookbinder, MD;  Location: [place] [place];  Service: General;  Laterality: Right;   [place]   LAPAROSCOPIC REMOVAL OF MESENTERIC MASS     LUMBAR [place]   [facility]   PARTIAL HYSTERECTOMY  1981   prolapsed uterus   TUBAL LIGATION  1978    Family History  Problem Relation Age of Onset   Stroke Mother    Cancer Father        unknown type   Cancer Brother        "blood cancer"   CVA Maternal Grandmother    Healthy Son  Breast cancer Neg Hx    Colon cancer Neg Hx     SOCIAL HX: reviewed.    Current Outpatient Medications:    potassium chloride (KLOR-CON M) 10 MEQ tablet, Take one tablet by mouth twice daily., Disp: 60 tablet, Rfl: 0   pravastatin (PRAVACHOL) 20 MG tablet, Take 1 tablet (20 mg total) by mouth daily., Disp: 90 tablet, Rfl: 0   tamoxifen (NOLVADEX) 20 MG tablet, Take 1 tablet (20 mg total) by mouth daily., Disp: 90 tablet, Rfl: 1   triamterene-hydrochlorothiazide (MAXZIDE-25) 37.5-25 MG tablet, Take 1 tablet by mouth daily., Disp: 30 tablet, Rfl: 0   citalopram (CELEXA) 40 MG  tablet, Take 1 tablet (40 mg total) by mouth daily., Disp: 90 tablet, Rfl: 0   clonazePAM (KLONOPIN) 0.5 MG tablet, TAKE 1/2 tablet bid, Disp: 30 tablet, Rfl: 0   Cyanocobalamin (B-12) 5000 MCG CAPS, Take 5,000 Units by mouth daily. Metabolism support, Disp: , Rfl:    Tiotropium Bromide-Olodaterol (STIOLTO RESPIMAT) 2.5-2.5 MCG/ACT AERS, Inhale 1 puff into the lungs daily., Disp: , Rfl:   EXAM:  GENERAL: alert, oriented, appears well and in no acute distress  HEENT: atraumatic, conjunttiva clear, no obvious abnormalities on inspection of external nose and ears  NECK: normal movements of the head and neck  LUNGS: on inspection no signs of respiratory distress, breathing rate appears normal, no obvious gross SOB, gasping or wheezing  CV: no obvious cyanosis  PSYCH/NEURO: pleasant and cooperative, no obvious depression or anxiety, speech and thought processing grossly intact  ASSESSMENT AND PLAN:  Discussed the following assessment and plan:  Problem List Items Addressed This Visit     Anxiety    Increased anxiety.  Worsened recently with diagnosis and treatment of breast cancer.  Discussed.  Get her back on citalopram and take in the am.  Remain off clonazepam since has tapered off.  Continue remeron.  See if getting back on citalopram helps.  Follow closely.  Call with update.  Went through (with her) her individual medications and when to take.        Essential hypertension, benign    Blood pressure as outlined.  On triam/hctz.  Follow pressures.  Follow metabolic panel.       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female, estrogen receptor positive ([place])    On tamoxifen 1/2 table q hs. Tolerating.         Return if symptoms worsen or fail to improve.   I discussed the assessment and treatment plan with the patient. The patient was provided an opportunity to ask questions and all were answered. The patient agreed with the plan and demonstrated an  understanding of the instructions.   The patient was advised to call back or seek an in-person evaluation if the symptoms worsen or if the condition fails to improve as anticipated.    [name], MD

## 2021-11-10 NOTE — Telephone Encounter (Signed)
See if she can do a virtual visit at 2:00.

## 2021-11-10 NOTE — Telephone Encounter (Signed)
Patient states she is having a panic attack.  Patient states Dr. [name] changed her medication from clonazePAM (KLONOPIN) 0.5 MG tablet (half tablet in the morning and half a tablet in the evening) and now has her taking mirtazapine (REMERON) 7.5 MG tablet. Patient states this new medication knocks her out at night.   Patient states she would like to know if she can half a tablet of the clonazePAM (KLONOPIN) 0.5 MG tablet right now since she is having a panic attack.  Dr. Nicki Reaper is not available at the time of the call.  I transferred call to Access Nurse.  Patient agreed to speak with Access Nurse, but would still like to have a call from Dr. [name].

## 2021-11-13 ENCOUNTER — Telehealth: Payer: Self-pay | Admitting: Internal Medicine

## 2021-11-13 DIAGNOSIS — J449 Chronic obstructive pulmonary disease, unspecified: Secondary | ICD-10-CM | POA: Diagnosis not present

## 2021-11-13 DIAGNOSIS — E876 Hypokalemia: Secondary | ICD-10-CM

## 2021-11-13 NOTE — Telephone Encounter (Signed)
Patient has a lab appt 11/17/2021, there are no orders in.

## 2021-11-14 ENCOUNTER — Ambulatory Visit (INDEPENDENT_AMBULATORY_CARE_PROVIDER_SITE_OTHER): Payer: Medicare Other | Admitting: Internal Medicine

## 2021-11-14 ENCOUNTER — Encounter: Payer: Self-pay | Admitting: Internal Medicine

## 2021-11-14 ENCOUNTER — Telehealth: Payer: Self-pay | Admitting: *Deleted

## 2021-11-14 DIAGNOSIS — J4520 Mild intermittent asthma, uncomplicated: Secondary | ICD-10-CM

## 2021-11-14 DIAGNOSIS — I10 Essential (primary) hypertension: Secondary | ICD-10-CM

## 2021-11-14 DIAGNOSIS — C50411 Malignant neoplasm of upper-outer quadrant of right female breast: Secondary | ICD-10-CM

## 2021-11-14 DIAGNOSIS — Z170 Estrogen receptor positive status [ER+]: Secondary | ICD-10-CM | POA: Diagnosis not present

## 2021-11-14 DIAGNOSIS — E7800 Pure hypercholesterolemia, unspecified: Secondary | ICD-10-CM

## 2021-11-14 DIAGNOSIS — E876 Hypokalemia: Secondary | ICD-10-CM | POA: Diagnosis not present

## 2021-11-14 DIAGNOSIS — F419 Anxiety disorder, unspecified: Secondary | ICD-10-CM

## 2021-11-14 DIAGNOSIS — J984 Other disorders of lung: Secondary | ICD-10-CM | POA: Diagnosis not present

## 2021-11-14 LAB — POTASSIUM: Potassium: 4 mEq/L (ref 3.5–5.1)

## 2021-11-14 MED ORDER — CLONAZEPAM 0.5 MG PO TABS: ORAL_TABLET | ORAL | 0 refills | Status: DC

## 2021-11-14 MED ORDER — CITALOPRAM HYDROBROMIDE 40 MG PO TABS: 40.0000 mg | ORAL_TABLET | Freq: Every day | ORAL | 0 refills | Status: DC

## 2021-11-14 NOTE — Patient Outreach (Signed)
  Care Coordination   Initial Visit Note   11/14/2021 Name: Jennifer [name] MRN: [id number] DOB: 1945-02-06  Jennifer [name] is a 76 y.o. year old female who sees [name], MD for primary care. I spoke with  Jennifer [name] by phone today.  What matters to the patients health and wellness today?  I would love to talk with someone about my HTN diet, COPD and hypokalemia.    Goals Addressed             This Visit's Progress    Develop plan of care for Hypertension, COPD          SDOH assessments and interventions completed:  Yes     Care Coordination Interventions Activated:  Yes  Care Coordination Interventions:  Yes, provided   Follow up plan: Follow up call scheduled for Jennifer [name] [phone] 2:00 PM    Encounter Outcome:  Pt. Visit Completed   [place] [phone]

## 2021-11-14 NOTE — Progress Notes (Signed)
[name], female   DOB: May 21, 1945, 76 y.o.   MRN: [id number]   Subjective:    [name], female    DOB: 1945/02/23, 76 y.o.   MRN: [id number]   Patient here for  Chief Complaint  Patient presents with   Follow-up    Medication discussion    .   HPI Work in - to discuss her medications.  Recently had informed me that she did not feel her clonazepam was working.  We tapered down and started her on remeron.  Since the change she has been more anxious and "jittery".  She had reported was off citalopram - by mistake for a few days.  She is accompanied by her husband today.  History obtained from both of them.  She brought her medication for [place] to review.  She is eating.  No chest pain or sob reported.     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 Antony Blackbird   Hypertension    PONV (postoperative nausea and vomiting)    Scoliosis    Ulcer    Past Surgical History:  Procedure Laterality Date   ABDOMINAL HYSTERECTOMY     ANTERIOR CERVICAL DECOMP/DISCECTOMY FUSION N/A 04/19/2017   Procedure: Anterior Cervical Decompression Fusion Cervical Three-Four, Cervical Four-Five, Cervical Five-Six;  Surgeon: Donalee Citrin, MD;  Location: [facility] OR;  Service: Neurosurgery;  Laterality: N/A;  Anterior Cervical Decompression Fusion Cervical Three-Four, Cervical Four-Five, Cervical Five-Six   BREAST BIOPSY Right 11/14/2020   [place] Bx, Venus Clip, Path Pending   BREAST LUMPECTOMY WITH RADIOACTIVE SEED AND SENTINEL LYMPH NODE BIOPSY Right 12/27/2020   Procedure: RIGHT BREAST LUMPECTOMY WITH RADIOACTIVE SEED AND AXILLARY SENTINEL LYMPH NODE BIOPSY;  Surgeon: Emelia Loron, MD;  Location: [facility] OR;  Service: General;  Laterality: Right;   CHOLECYSTECTOMY  1996   DILATION AND CURETTAGE OF UTERUS  1971   LAPAROSCOPIC REMOVAL OF MESENTERIC MASS     LUMBAR DISC SURGERY  1998   [facility]   PARTIAL HYSTERECTOMY  1981   prolapsed uterus   TUBAL LIGATION  1978   Family History  Problem Relation Age of Onset   Stroke Mother    Cancer Father        unknown type   Cancer Brother        "blood cancer"   CVA Maternal Grandmother    Healthy Son    Breast cancer Neg Hx    Colon cancer Neg Hx    Social History   Socioeconomic History   Marital status: Married    Spouse name: Mariana Kaufman "[name]"   Number of children: 2   Years of education: Not on file   Highest education level: Not on file  Occupational History   Occupation: retired Runner, broadcasting/film/video  Tobacco Use   Smoking status: Never   Smokeless tobacco: Never  Vaping Use   Vaping Use: Never used  Substance and Sexual Activity   Alcohol use: No    Alcohol/week: 0.0 standard drinks of alcohol   Drug use: No   Sexual activity: Not Currently  Other  Topics Concern   Not on file  Social History Narrative   Right handed    Lives with husband   Had breast cancer surgery right side   Worked in the school system    Social Determinants of Health   Financial Resource Strain: Low Risk  (09/10/2020)   Overall Financial Resource Strain (CARDIA)    Difficulty of Paying Living Expenses: Not hard at all  Food Insecurity: No Food Insecurity (09/10/2020)   Hunger Vital Sign    Worried About Running Out of Food in the Last Year: Never true    Ran Out of Food in the Last Year: Never true  Transportation Needs: No Transportation Needs (09/10/2020)   PRAPARE - Administrator, Civil Service (Medical): No    Lack of Transportation (Non-Medical): No  Physical Activity: Unknown (09/10/2020)   Exercise Vital Sign    Days of Exercise per Week: 0 days    Minutes of Exercise per Session: Not on file  Stress: No Stress  Concern Present (09/10/2020)   [employer] of Occupational Health - Occupational Stress Questionnaire    Feeling of Stress : Not at all  Social Connections: Unknown (09/10/2020)   Social Connection and Isolation Panel [NHANES]    Frequency of Communication with Friends and Family: More than three times a week    Frequency of Social Gatherings with Friends and Family: Not on file    Attends Religious Services: Not on file    Active Member of Clubs or Organizations: Not on file    Attends Banker Meetings: Not on file    Marital Status: Married     Review of Systems  Constitutional:  Negative for appetite change and unexpected weight change.  HENT:  Negative for congestion and sinus pressure.   Respiratory:  Negative for cough, chest tightness and shortness of breath.   Cardiovascular:  Negative for chest pain, palpitations and leg swelling.  Gastrointestinal:  Negative for abdominal pain, diarrhea, nausea and vomiting.  Genitourinary:  Negative for difficulty urinating and dysuria.  Musculoskeletal:  Negative for joint swelling and myalgias.  Skin:  Negative for color change and rash.  Neurological:  Negative for dizziness and headaches.  Psychiatric/Behavioral:  Negative for dysphoric mood.        Increased anxiety as outlined.  Wants to go back on clonazepam.  Feels this helped.        Objective:     BP 110/60 (BP Location: Left Arm, Patient Position: Sitting, Cuff Size: Normal)   Pulse 79   Temp 98.5 F (36.9 C) (Oral)   Ht 5\' 1"  (1.549 m)   Wt 136 lb 6.4 oz (61.9 kg)   SpO2 95%   BMI 25.77 kg/m  Wt Readings from Last 3 Encounters:  11/14/21 136 lb 6.4 oz (61.9 kg)  10/29/21 137 lb 9.6 oz (62.4 kg)  10/07/21 138 lb 6.4 oz (62.8 kg)    Physical Exam Vitals reviewed.  Constitutional:      General: She is not in acute distress.    Appearance: Normal appearance.  HENT:     Head: Normocephalic and atraumatic.     Right Ear: External ear normal.      Left Ear: External ear normal.  Eyes:     General: No scleral icterus.       Right eye: No discharge.        Left eye: No discharge.     Conjunctiva/sclera: Conjunctivae normal.  Neck:     Thyroid:  No thyromegaly.  Cardiovascular:     Rate and Rhythm: Normal rate and regular rhythm.  Pulmonary:     Effort: No respiratory distress.     Breath sounds: Normal breath sounds. No wheezing.  Abdominal:     General: Bowel sounds are normal.     Palpations: Abdomen is soft.     Tenderness: There is no abdominal tenderness.  Musculoskeletal:        General: No swelling or tenderness.     Cervical back: Neck supple. No tenderness.  Lymphadenopathy:     Cervical: No cervical adenopathy.  Skin:    Findings: No erythema or rash.  Neurological:     Mental Status: She is alert.  Psychiatric:        Mood and Affect: Mood normal.        Behavior: Behavior normal.      Outpatient Encounter Medications as of 11/14/2021  Medication Sig   Cyanocobalamin (B-12) 5000 MCG CAPS Take 5,000 Units by mouth daily. Metabolism support   potassium chloride (KLOR-CON M) 10 MEQ tablet Take one tablet by mouth twice daily.   pravastatin (PRAVACHOL) 20 MG tablet Take 1 tablet (20 mg total) by mouth daily.   tamoxifen (NOLVADEX) 20 MG tablet Take 1 tablet (20 mg total) by mouth daily.   Tiotropium Bromide-Olodaterol (STIOLTO RESPIMAT) 2.5-2.5 MCG/ACT AERS Inhale 1 puff into the lungs daily.   triamterene-hydrochlorothiazide (MAXZIDE-25) 37.5-25 MG tablet Take 1 tablet by mouth daily.   [DISCONTINUED] citalopram (CELEXA) 40 MG tablet Take 1 tablet (40 mg total) by mouth daily. (Patient taking differently: Take 1 tablet (40 mg total) by mouth daily.)   [DISCONTINUED] clonazePAM (KLONOPIN) 0.5 MG tablet TAKE 1/2 TO 1 TABLET BY MOUTH DAILY AS NEEDED. (Patient taking differently: TAKE 1/2 TO 1 TABLET BY MOUTH DAILY AS NEEDED.)   [DISCONTINUED] mirtazapine (REMERON) 7.5 MG tablet Take 1 tablet (7.5 mg total) by mouth  at bedtime.   citalopram (CELEXA) 40 MG tablet Take 1 tablet (40 mg total) by mouth daily.   clonazePAM (KLONOPIN) 0.5 MG tablet TAKE 1/2 tablet bid   [DISCONTINUED] Apoaequorin (PREVAGEN PO) Take 1 capsule by mouth daily. Pt takes medication every other day (Patient not taking: Reported on 11/10/2021)   No facility-administered encounter medications on file as of 11/14/2021.     Lab Results  Component Value Date   WBC 7.9 10/07/2021   HGB 13.5 10/07/2021   HCT 39.3 10/07/2021   PLT 193.0 10/07/2021   GLUCOSE 94 10/07/2021   CHOL 150 06/04/2021   TRIG 110.0 06/04/2021   HDL 61.70 06/04/2021   LDLDIRECT 148.4 11/03/2012   LDLCALC 66 06/04/2021   ALT 12 10/07/2021   AST 21 10/07/2021   NA 139 10/07/2021   K 4.0 11/14/2021   CL 99 10/07/2021   CREATININE 0.83 10/07/2021   BUN 11 10/07/2021   CO2 33 (H) 10/07/2021   TSH 0.95 06/04/2021   HGBA1C 5.5 06/04/2021       Assessment & Plan:   Problem List Items Addressed This Visit     Anxiety    Increased stress and anxiety.  Anxiety has worsened with recent medication change.  Discussed with her and her husband today.  Will continue citalopram 40mg  q day.  Stop remeron.  Start her back on clonazepam 1/2 tablet bid - to help calm her down. Discussed may need to taper this in the future, but for now need to get things leveled off.  She was comfortable with this plan.  Relevant Medications   citalopram (CELEXA) 40 MG tablet   Asthma    Continue stiolto.  Breathing doing well.  Follow       Chronic restrictive lung disease    Breathing stable.       Essential hypertension, benign    Blood pressure as outlined.  On triam/hctz.  Follow pressures.  Follow metabolic panel.       Hypercholesterolemia    On pravastatin.  Low cholesterol diet and exercise.  Follow lipid panel and liver function tests.        Hypokalemia    Now on kcl bid.  Recheck potassium today.       Malignant neoplasm of upper-outer quadrant of  right breast in female, estrogen receptor positive (HCC)    Recent diagnosis of stage 1A right breast cancer.  S/p right breast lumpectomy and right axillary sentinel node biopsy - 12/27/20. S/p adjuvant radiation therapy.  Seeing oncology.  Had been on tamoxifen.  Concern was contributing to tremor.  Saw neurology. Note reviewed as outlined.  Saw Dr Tat.  No evidence of neurodegenerative process.  No focal or lateralizing findings on exam.  Recommended continuing tamoxifen.  She was taken off tamoxifen recently given concern over intolerance.  Was asked to remain off for a short period and then restart 1/2 tablet.  Back on now 1/2 tablet.  Appears to be tolerating.  Increased stress related to above.         Dale Kalifornsky, MD

## 2021-11-14 NOTE — Addendum Note (Signed)
Addended by: Alisa Graff on: 11/14/2021 04:00 AM   Modules accepted: Orders

## 2021-11-14 NOTE — Telephone Encounter (Signed)
Order placed for f/u potassium.  

## 2021-11-16 ENCOUNTER — Encounter: Payer: Self-pay | Admitting: Internal Medicine

## 2021-11-16 NOTE — Assessment & Plan Note (Signed)
Continue stiolto.  Breathing doing well.  Follow

## 2021-11-16 NOTE — Assessment & Plan Note (Signed)
Increased stress and anxiety.  Anxiety has worsened with recent medication change.  Discussed with her and her husband today.  Will continue citalopram '40mg'$  q day.  Stop remeron.  Start her back on clonazepam 1/2 tablet bid - to help calm her down. Discussed may need to taper this in the future, but for now need to get things leveled off.  She was comfortable with this plan.

## 2021-11-16 NOTE — Assessment & Plan Note (Signed)
On pravastatin.  Low cholesterol diet and exercise.  Follow lipid panel and liver function tests.   

## 2021-11-16 NOTE — Assessment & Plan Note (Signed)
Now on kcl bid.  Recheck potassium today.

## 2021-11-16 NOTE — Assessment & Plan Note (Signed)
Blood pressure as outlined.  On triam/hctz.  Follow pressures.  Follow metabolic panel.

## 2021-11-16 NOTE — Assessment & Plan Note (Signed)
Breathing stable.

## 2021-11-16 NOTE — Assessment & Plan Note (Signed)
Increased anxiety.  Worsened recently with diagnosis and treatment of breast cancer.  Discussed.  Get her back on citalopram and take in the am.  Remain off clonazepam since has tapered off.  Continue remeron.  See if getting back on citalopram helps.  Follow closely.  Call with update.  Went through (with her) her individual medications and when to take.

## 2021-11-16 NOTE — Assessment & Plan Note (Signed)
Recent diagnosis of stage 1A right breast cancer.  S/p right breast lumpectomy and right axillary sentinel node biopsy - 12/27/20. S/p adjuvant radiation therapy.  Seeing oncology.  Had been on tamoxifen.  Concern was contributing to tremor.  Saw neurology. Note reviewed as outlined.  Saw Dr Tat.  No evidence of neurodegenerative process.  No focal or lateralizing findings on exam.  Recommended continuing tamoxifen.  She was taken off tamoxifen recently given concern over intolerance.  Was asked to remain off for a short period and then restart 1/2 tablet.  Back on now 1/2 tablet.  Appears to be tolerating.  Increased stress related to above.

## 2021-11-16 NOTE — Assessment & Plan Note (Signed)
On tamoxifen 1/2 table q hs. Tolerating.

## 2021-11-17 ENCOUNTER — Other Ambulatory Visit: Payer: Medicare Other

## 2021-11-17 ENCOUNTER — Telehealth: Payer: Self-pay | Admitting: Internal Medicine

## 2021-11-17 NOTE — Telephone Encounter (Signed)
Pt returning call to cma

## 2021-11-17 NOTE — Telephone Encounter (Signed)
S/w pt - explained why medication changed

## 2021-11-18 ENCOUNTER — Other Ambulatory Visit: Payer: Self-pay

## 2021-11-18 DIAGNOSIS — E876 Hypokalemia: Secondary | ICD-10-CM

## 2021-11-21 ENCOUNTER — Other Ambulatory Visit: Payer: Self-pay | Admitting: Internal Medicine

## 2021-11-25 ENCOUNTER — Ambulatory Visit: Payer: Self-pay

## 2021-11-25 NOTE — Patient Outreach (Signed)
  Care Coordination   Follow Up Visit Note   11/25/2021 Name: Jennifer [name] MRN: [id number] DOB: 1945-06-16  Jennifer [name] is a 76 y.o. year old female who sees [name], MD for primary care. I spoke with  Jennifer [name] by phone today.  What matters to the patients health and wellness today?  Patient states she doesn't feel she needs follow up related to her blood pressure and COPD. She states she is managing these conditions and follows up with her providers regularly.  Patient declined care coordination services.     Goals Addressed             This Visit's Progress    Develop plan of care for Hypertension, COPD       Care Coordination Interventions: Reviewed care coordination services with patient Patient advised to contact primary care provider office if care coordination services needed in the future.           SDOH assessments and interventions completed:  No  SDOH Interventions Today    Flowsheet Row Most Recent Value  SDOH Interventions   Food Insecurity Interventions Intervention Not Indicated  Housing Interventions Intervention Not Indicated  Transportation Interventions Intervention Not Indicated        Care Coordination Interventions Activated:  Yes  Care Coordination Interventions:  Yes, provided   Follow up plan: No further intervention required.   Encounter Outcome:  Pt. Visit Completed   Quinn Plowman RN,BSN,CCM [place] [phone] direct line

## 2021-11-27 ENCOUNTER — Telehealth: Payer: Medicare Other | Admitting: Nurse Practitioner

## 2021-11-27 ENCOUNTER — Inpatient Hospital Stay: Payer: Medicare Other | Attending: Radiation Oncology | Admitting: Hematology

## 2021-11-27 ENCOUNTER — Encounter: Payer: Self-pay | Admitting: Hematology

## 2021-11-27 DIAGNOSIS — Z170 Estrogen receptor positive status [ER+]: Secondary | ICD-10-CM | POA: Diagnosis not present

## 2021-11-27 DIAGNOSIS — C50411 Malignant neoplasm of upper-outer quadrant of right female breast: Secondary | ICD-10-CM | POA: Diagnosis not present

## 2021-11-27 MED ORDER — TAMOXIFEN CITRATE 10 MG PO TABS: 10.0000 mg | ORAL_TABLET | Freq: Every day | ORAL | 1 refills | Status: DC

## 2021-11-27 NOTE — Progress Notes (Signed)
Jennifer [name]   [phone] [phone]   Clinic Follow up Note   Patient Care Team: [name], MD as PCP - General (Internal Medicine) Gery Pray, MD as Consulting Physician (Radiation Oncology) Truitt Merle, MD as Consulting Physician (Hematology and Oncology) Rolm Bookbinder, MD as Consulting Physician (General Surgery) Tat, Eustace Quail, DO as Consulting Physician (Neurology)  Date of Service:  11/27/2021  I connected with Jennifer [name] on 11/27/2021 at  1:00 PM EST by telephone visit and verified that I am speaking with the correct person using two identifiers.  I discussed the limitations, risks, security and privacy concerns of performing an evaluation and management service by telephone and the availability of in person appointments. I also discussed with the patient that there may be a patient responsible charge related to this service. The patient expressed understanding and agreed to proceed.   Other persons participating in the visit and their role in the encounter:  none  Patient's location:  home Provider's location:  my office  CHIEF COMPLAINT: f/u of right breast cancer  CURRENT THERAPY:  Tamoxifen, started ~03/2021, currently 12m since 09/2021  ASSESSMENT & PLAN:  Jennifer RAMPERSADis a 76y.o. female with   1. Malignant neoplasm of upper-outer quadrant of right breast, Stage IA, p(T1c, N0), ER+/PR+/HER2-, Grade 2, Oncotype RS 3 -found on screening mammogram. S/p right lumpectomy on 12/27/20 with Dr. WDonne Hazel path showed 1.2 cm IDC, margins and lymph nodes negative. Oncotype RS of 3, low risk. S/p radiation 02/03/21 - 02/28/21 under Dr. KSondra Come -she started tamoxifen in 03/2021, but she states she never requested additional refills. Presumably she took for 3 months and has been off for 2. She restarted after 08/21/21 visit, but she experienced tremors, nausea, and fatigue. I reduced her dose to 114mand advised her to take at night, and she  is tolerating much better. -she is scheduled for annual mammogram with right breast [place] 12/24/21. -f/u in 03/2022 as scheduled   2. Osteopenia -Her most recent DEXA was 08/17/16 showing osteopenia (T-score -1.1).  -repeat DEXA scheduled with mammogram on 12/24/21   3. HTN, depression, arthritis  -continue medication      PLAN:  -continue tamoxifen 1083mcalled in today  -mammogram and DEXA in 12/24/21 -lab and f/u with NP LinMendel Ryder 03/23/22 as scheduled   No problem-specific Assessment & Plan notes found for this encounter.   SUMMARY OF ONCOLOGIC HISTORY: Oncology History Overview Note   Cancer Staging  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female, estrogen receptor positive (HCCSykesvilletaging form: Breast, AJCC 8th Edition - Clinical stage from 11/14/2020: Stage IA (cT1c, cN0, cM0, G2, ER+, PR+, HER2-) - Signed by FenTruitt MerleD on 12/17/2020 Stage prefix: Initial diagnosis Histologic grading system: 3 grade system - Pathologic stage from 12/27/2020: Stage IA (pT1c, pN0, cM0, G2, ER+, PR+, HER2-) - Signed by FenTruitt MerleD on 02/25/2021 Stage prefix: Initial diagnosis Histologic grading system: 3 grade system Residual tumor (R): R0 - None     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female, estrogen receptor positive (HCCAtglen10/17/2022 Mammogram   EXAM: DIGITAL DIAGNOSTIC UNILATERAL RIGHT MAMMOGRAM WITH TOMOSYNTHESIS AND CAD; ULTRASOUND RIGHT BREAST LIMITED  FINDINGS: Targeted ultrasound is performed, showing irregular hypoechoic mass at the right breast 11 o'clock 5 cm from nipple measuring 1 x 0.5 x 1.2 cm correlating to the mammographic mass. Ultrasound of the right axilla is negative.   11/14/2020 Cancer Staging  Staging form: Breast, AJCC 8th Edition - Clinical stage from 11/14/2020: Stage IA (cT1c, cN0, cM0, G2, ER+, PR+, HER2-) - Signed by Truitt Merle, MD on 12/17/2020 Stage prefix: Initial diagnosis Histologic grading system: 3  grade system   11/14/2020 Pathology Results   DIAGNOSIS:  A. BREAST, RIGHT AT 11:00, 5 CM FROM THE NIPPLE; ULTRASOUND-GUIDED CORE NEEDLE BIOPSY:  - INVASIVE MAMMARY CARCINOMA, NO SPECIAL TYPE.  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female, estrogen receptor positive ([place])   12/27/2020 Cancer Staging   Staging form: Breast, AJCC 8th Edition - Pathologic stage from 12/27/2020: Stage IA (pT1c, pN0, cM0, G2, ER+, PR+, HER2-) - Signed by Truitt Merle, MD on 02/25/2021 Stage prefix: Initial diagnosis Histologic grading system: 3 grade system Residual tumor (R): R0 - None   12/27/2020 Surgery   Right lumpectomy: IDC g2, 1.2cm, margins negative, 2 SLN negative   02/03/2021 - 02/28/2021 Radiation Therapy   Site Technique Total Dose (Gy) Dose per Fx (Gy) Completed Fx Beam Energies  Breast, Right: Breast_R 3D 40.05/40.05 2.67 15/15 6XFFF  Breast, Right: Breast_R_Bst 3D 10/10 2 5/5 6X     03/19/2021 -  Anti-estrogen oral therapy   20 mg Tamoxifen      INTERVAL HISTORY:  Jennifer [name] was contacted for a follow up of breast cancer. She was last seen by me on 08/21/21. She reports she is doing "so-so." She notes she is tolerating tamoxifen much better with half dose (10 mg) at night. She tells me she is still dealing with tremors.   All other systems were reviewed with the patient and are negative.  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 Gery Pray   Hypertension    PONV (postoperative nausea and vomiting)    Scoliosis    Ulcer     SURGICAL HISTORY: Past Surgical History:  Procedure Laterality Date   ABDOMINAL HYSTERECTOMY     ANTERIOR CERVICAL DECOMP/DISCECTOMY FUSION N/A 04/19/2017   Procedure: Anterior Cervical Decompression Fusion Cervical Three-Four, Cervical Four-Five, Cervical Five-Six;  Surgeon: Kary Kos, MD;  Location: [place];  Service: Neurosurgery;  Laterality: N/A;  Anterior Cervical Decompression Fusion Cervical Three-Four, Cervical Four-Five, Cervical Five-Six   BREAST BIOPSY Right 11/14/2020   [place] Bx, Venus Clip, Path Pending   BREAST LUMPECTOMY WITH RADIOACTIVE SEED AND SENTINEL LYMPH NODE BIOPSY Right 12/27/2020   Procedure: RIGHT BREAST LUMPECTOMY WITH RADIOACTIVE SEED AND AXILLARY SENTINEL LYMPH NODE  BIOPSY;  Surgeon: Rolm Bookbinder, MD;  Location: [place];  Service: General;  Laterality: Right;   [place] OF MESENTERIC MASS     LUMBAR [place]   PARTIAL HYSTERECTOMY  1981   prolapsed uterus   TUBAL LIGATION  1978    I have reviewed the social history and family history with the patient and they are unchanged from previous note.  ALLERGIES:  is allergic to advair diskus [fluticasone-salmeterol] and codeine.  MEDICATIONS:  Current Outpatient Medications  Medication Sig Dispense Refill   tamoxifen (NOLVADEX) 10 MG tablet Take 1 tablet (10 mg total) by mouth daily. 90 tablet 1   citalopram (CELEXA) 40 MG tablet Take 1 tablet (40 mg total) by mouth daily. 90 tablet 0   clonazePAM (KLONOPIN) 0.5  MG tablet TAKE 1/2 tablet bid 30 tablet 0   Cyanocobalamin (B-12) 5000 MCG CAPS Take 5,000 Units by mouth daily. Metabolism support     potassium chloride (KLOR-CON M) 10 MEQ tablet Take one tablet by mouth twice daily. 60 tablet 0   pravastatin (PRAVACHOL) 20 MG tablet Take 1 tablet (20 mg total) by mouth daily. 90 tablet 0   Tiotropium Bromide-Olodaterol (STIOLTO RESPIMAT) 2.5-2.5 MCG/ACT AERS Inhale 1 puff into the lungs daily.     triamterene-hydrochlorothiazide (MAXZIDE-25) 37.5-25 MG tablet Take 1 tablet by mouth daily. 30 tablet 0   No current facility-administered medications for this visit.    PHYSICAL EXAMINATION: ECOG PERFORMANCE STATUS: 1 - Symptomatic but completely ambulatory  There were no vitals filed for this visit. Wt Readings from Last 3 Encounters:  11/14/21 136 lb 6.4 oz (61.9 kg)  10/29/21 137 lb 9.6 oz (62.4 kg)  10/07/21 138 lb 6.4 oz (62.8 kg)     No vitals taken today, Exam not performed today  LABORATORY DATA:  I have reviewed the data as listed    Latest Ref Rng & Units 10/07/2021    1:40 PM 08/21/2021    1:55 PM 12/19/2020    2:30 PM  CBC  WBC 4.0 - 10.5 K/uL 7.9  7.8  10.0   Hemoglobin 12.0 - 15.0 g/dL 13.5  14.2  14.2   Hematocrit 36.0 - 46.0 % 39.3  41.0  44.2   Platelets 150.0 - 400.0 K/uL 193.0  222  231         Latest Ref Rng & Units 11/14/2021   10:29 AM 11/03/2021   11:02 AM 10/22/2021   10:31 AM  CMP  Potassium 3.5 - 5.1 mEq/L 4.0  3.6  3.4       RADIOGRAPHIC STUDIES: I have personally reviewed the radiological images as listed and agreed with the findings in the report. No results found.    No orders of the defined types were placed in this encounter.  All questions were answered. The patient knows to call the clinic with any problems, questions or concerns. No barriers to learning was detected. The total time spent in the appointment was 15 minutes.     Truitt Merle, MD 11/27/2021   I, Wilburn Mylar, am acting as scribe  for Truitt Merle, MD.   I have reviewed the above documentation for accuracy and completeness, and I agree with the above.

## 2021-12-02 ENCOUNTER — Other Ambulatory Visit (INDEPENDENT_AMBULATORY_CARE_PROVIDER_SITE_OTHER): Payer: Medicare Other

## 2021-12-02 ENCOUNTER — Telehealth: Payer: Self-pay

## 2021-12-02 DIAGNOSIS — E876 Hypokalemia: Secondary | ICD-10-CM

## 2021-12-02 LAB — POTASSIUM: Potassium: 3.4 mEq/L — ABNORMAL LOW (ref 3.5–5.1)

## 2021-12-02 NOTE — Telephone Encounter (Signed)
Patient states she is calling for her lab results.  Patient states she is unable to access MyChart from her phone.

## 2021-12-02 NOTE — Telephone Encounter (Signed)
Pt advised we dont have results yet

## 2021-12-09 ENCOUNTER — Other Ambulatory Visit: Payer: Self-pay | Admitting: Internal Medicine

## 2021-12-14 DIAGNOSIS — J449 Chronic obstructive pulmonary disease, unspecified: Secondary | ICD-10-CM | POA: Diagnosis not present

## 2021-12-15 ENCOUNTER — Other Ambulatory Visit: Payer: Self-pay | Admitting: Internal Medicine

## 2021-12-16 ENCOUNTER — Encounter: Payer: Self-pay | Admitting: Internal Medicine

## 2021-12-16 ENCOUNTER — Ambulatory Visit (INDEPENDENT_AMBULATORY_CARE_PROVIDER_SITE_OTHER): Payer: Medicare Other | Admitting: Internal Medicine

## 2021-12-16 VITALS — BP 116/70 | HR 84 | Temp 97.5°F | Ht 61.5 in | Wt 134.0 lb

## 2021-12-16 DIAGNOSIS — R630 Anorexia: Secondary | ICD-10-CM

## 2021-12-16 DIAGNOSIS — J4520 Mild intermittent asthma, uncomplicated: Secondary | ICD-10-CM | POA: Diagnosis not present

## 2021-12-16 DIAGNOSIS — Z170 Estrogen receptor positive status [ER+]: Secondary | ICD-10-CM

## 2021-12-16 DIAGNOSIS — I10 Essential (primary) hypertension: Secondary | ICD-10-CM | POA: Diagnosis not present

## 2021-12-16 DIAGNOSIS — E7800 Pure hypercholesterolemia, unspecified: Secondary | ICD-10-CM | POA: Diagnosis not present

## 2021-12-16 DIAGNOSIS — F419 Anxiety disorder, unspecified: Secondary | ICD-10-CM | POA: Diagnosis not present

## 2021-12-16 DIAGNOSIS — C50411 Malignant neoplasm of upper-outer quadrant of right female breast: Secondary | ICD-10-CM

## 2021-12-16 DIAGNOSIS — E876 Hypokalemia: Secondary | ICD-10-CM | POA: Diagnosis not present

## 2021-12-16 DIAGNOSIS — R739 Hyperglycemia, unspecified: Secondary | ICD-10-CM | POA: Diagnosis not present

## 2021-12-16 LAB — HEPATIC FUNCTION PANEL
ALT: 12 U/L (ref 0–35)
AST: 21 U/L (ref 0–37)
Albumin: 4.3 g/dL (ref 3.5–5.2)
Alkaline Phosphatase: 39 U/L (ref 39–117)
Bilirubin, Direct: 0.1 mg/dL (ref 0.0–0.3)
Total Bilirubin: 0.5 mg/dL (ref 0.2–1.2)
Total Protein: 6.5 g/dL (ref 6.0–8.3)

## 2021-12-16 LAB — BASIC METABOLIC PANEL
BUN: 11 mg/dL (ref 6–23)
CO2: 32 mEq/L (ref 19–32)
Calcium: 9.4 mg/dL (ref 8.4–10.5)
Chloride: 101 mEq/L (ref 96–112)
Creatinine, Ser: 0.87 mg/dL (ref 0.40–1.20)
GFR: 65.42 mL/min (ref >60.00)
Glucose, Bld: 90 mg/dL (ref 70–99)
Potassium: 3.7 mEq/L (ref 3.5–5.1)
Sodium: 140 mEq/L (ref 135–145)

## 2021-12-16 LAB — TSH: TSH: 0.93 u[IU]/mL (ref 0.35–5.50)

## 2021-12-16 MED ORDER — LORAZEPAM 0.5 MG PO TABS: 0.5000 mg | ORAL_TABLET | Freq: Two times a day (BID) | ORAL | 1 refills | Status: DC | PRN

## 2021-12-16 NOTE — Progress Notes (Unsigned)
[name], female   DOB: [birth year], 76 y.o.   MRN: [id number]   Subjective:    [name], female    DOB: 1945/03/14, 76 y.o.   MRN: [id number]   Patient here for  Chief Complaint  Patient presents with   Follow-up   .   HPI Here to follow up regarding increased stress/anxiety.    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 Gery Pray   Hypertension    PONV (postoperative nausea and vomiting)    Scoliosis    Ulcer    Past Surgical History:  Procedure Laterality Date   ABDOMINAL HYSTERECTOMY     ANTERIOR CERVICAL DECOMP/DISCECTOMY FUSION N/A 04/19/2017   Procedure: Anterior Cervical Decompression Fusion Cervical Three-Four, Cervical Four-Five, Cervical Five-Six;  Surgeon: Kary Kos, MD;  Location: [facility];  Service: Neurosurgery;  Laterality: N/A;  Anterior Cervical Decompression Fusion Cervical Three-Four, Cervical Four-Five, Cervical Five-Six   BREAST BIOPSY Right 11/14/2020   [place] Bx, Venus Clip, Path Pending   BREAST LUMPECTOMY WITH RADIOACTIVE SEED AND SENTINEL LYMPH NODE BIOPSY Right 12/27/2020   Procedure: RIGHT BREAST LUMPECTOMY WITH RADIOACTIVE SEED AND AXILLARY SENTINEL LYMPH NODE BIOPSY;  Surgeon: Rolm Bookbinder, MD;  Location: [facility];  Service: General;  Laterality: Right;   [place]   LAPAROSCOPIC REMOVAL OF MESENTERIC MASS     LUMBAR [place]   [facility]   PARTIAL HYSTERECTOMY  1981   prolapsed uterus   TUBAL LIGATION  1978   Family History  Problem Relation Age of Onset   Stroke Mother    Cancer Father        unknown type   Cancer Brother        "blood  cancer"   CVA Maternal Grandmother    Healthy Son    Breast cancer Neg Hx    Colon cancer Neg Hx    Social History   Socioeconomic History   Marital status: Married    Spouse name: Marchia Bond "[name]"   Number of children: 2   Years of education: Not on file   Highest education level: Not on file  Occupational History   Occupation: retired Pharmacist, hospital  Tobacco Use   Smoking status: Never   Smokeless tobacco: Never  Vaping Use   Vaping Use: Never used  Substance and Sexual Activity   Alcohol use: No    Alcohol/week: 0.0 standard drinks of alcohol   Drug use: No   Sexual activity: Not Currently  Other Topics Concern   Not on file  Social History Narrative   Right handed    Lives with husband   Had breast cancer surgery right side   Worked in the school system    Social Determinants of Health   Financial Resource Strain: Low Risk  (09/10/2020)   Overall Financial Resource Strain (CARDIA)    Difficulty of Paying Living Expenses: Not hard at all  Food Insecurity: No Food Insecurity (11/25/2021)   Hunger Vital Sign    Worried About Running Out  of Food in the Last Year: Never true    [place] in the Last Year: Never true  Transportation Needs: No Transportation Needs (11/25/2021)   PRAPARE - Hydrologist (Medical): No    Lack of Transportation (Non-Medical): No  Physical Activity: Unknown (09/10/2020)   Exercise Vital Sign    Days of Exercise per Week: 0 days    Minutes of Exercise per Session: Not on file  Stress: No Stress Concern Present (09/10/2020)   [place]    Feeling of Stress : Not at all  Social Connections: Unknown (09/10/2020)   Social Connection and Isolation Panel [NHANES]    Frequency of Communication with Friends and Family: More than three times a week    Frequency of Social Gatherings with Friends and Family: Not on file    Attends Religious Services: Not on file     Active Member of Clubs or Organizations: Not on file    Attends Archivist Meetings: Not on file    Marital Status: Married     Review of Systems     Objective:     BP 116/70 (BP Location: Left Arm, Patient Position: Sitting, Cuff Size: Normal)   Pulse 84   Temp (!) 97.5 F (36.4 C) (Oral)   Ht 5' 1.5" (1.562 m)   Wt 134 lb (60.8 kg)   SpO2 97%   BMI 24.91 kg/m  Wt Readings from Last 3 Encounters:  12/16/21 134 lb (60.8 kg)  11/14/21 136 lb 6.4 oz (61.9 kg)  10/29/21 137 lb 9.6 oz (62.4 kg)    Physical Exam   Outpatient Encounter Medications as of 12/16/2021  Medication Sig   citalopram (CELEXA) 40 MG tablet Take 1 tablet (40 mg total) by mouth daily.   Cyanocobalamin (B-12) 5000 MCG CAPS Take 5,000 Units by mouth daily. Metabolism support   LORazepam (ATIVAN) 0.5 MG tablet Take 1 tablet (0.5 mg total) by mouth 2 (two) times daily as needed for anxiety.   potassium chloride (KLOR-CON M) 10 MEQ tablet Take one tablet by mouth twice daily.   pravastatin (PRAVACHOL) 20 MG tablet Take 1 tablet (20 mg total) by mouth daily.   tamoxifen (NOLVADEX) 10 MG tablet Take 1 tablet (10 mg total) by mouth daily.   Tiotropium Bromide-Olodaterol (STIOLTO RESPIMAT) 2.5-2.5 MCG/ACT AERS Inhale 1 puff into the lungs daily.   triamterene-hydrochlorothiazide (MAXZIDE-25) 37.5-25 MG tablet Take 1 tablet by mouth daily.   [DISCONTINUED] clonazePAM (KLONOPIN) 0.5 MG tablet TAKE 1/2 tablet bid   No facility-administered encounter medications on file as of 12/16/2021.     Lab Results  Component Value Date   WBC 7.9 10/07/2021   HGB 13.5 10/07/2021   HCT 39.3 10/07/2021   PLT 193.0 10/07/2021   GLUCOSE 94 10/07/2021   CHOL 150 06/04/2021   TRIG 110.0 06/04/2021   HDL 61.70 06/04/2021   LDLDIRECT 148.4 11/03/2012   LDLCALC 66 06/04/2021   ALT 12 10/07/2021   AST 21 10/07/2021   NA 139 10/07/2021   K 3.4 (L) 12/02/2021   CL 99 10/07/2021   CREATININE 0.83 10/07/2021    BUN 11 10/07/2021   CO2 33 (H) 10/07/2021   TSH 0.95 06/04/2021   HGBA1C 5.5 06/04/2021       Assessment & Plan:   Problem List Items Addressed This Visit     Essential hypertension, benign - Primary   Hypercholesterolemia   Relevant Orders  TSH   Hepatic function panel   Hypokalemia   Relevant Orders   Basic metabolic panel     [name], MD

## 2021-12-17 ENCOUNTER — Encounter: Payer: Self-pay | Admitting: Internal Medicine

## 2021-12-17 ENCOUNTER — Telehealth: Payer: Self-pay | Admitting: Internal Medicine

## 2021-12-17 NOTE — Assessment & Plan Note (Signed)
Reports now taking potassium 30mq (one tablet) bid.  Recheck potassium today.

## 2021-12-17 NOTE — Telephone Encounter (Signed)
Lft pt vm to confirm ins.thanks

## 2021-12-17 NOTE — Assessment & Plan Note (Signed)
Blood pressure as outlined.  On triam/hctz.  Follow pressures.  Follow metabolic panel.

## 2021-12-17 NOTE — Assessment & Plan Note (Signed)
Continue stiolto.  Breathing doing well.  Follow

## 2021-12-17 NOTE — Telephone Encounter (Signed)
Patient called wanting to know her lab results. Note from Dr Nicki Reaper in labs was read.

## 2021-12-17 NOTE — Assessment & Plan Note (Signed)
Follow met b and A1c.  

## 2021-12-17 NOTE — Assessment & Plan Note (Signed)
Has occurred with increased anxiety.  Did not feel remeron helped. Off now.  Treatment and referral to psychiatry as outlined.

## 2021-12-17 NOTE — Assessment & Plan Note (Signed)
Increased anxiety.  Worsened recently with diagnosis and treatment of breast cancer.  Discussed.  Taking citalopram '40mg'$  q am. Is not taking remeron.  Has been taking 1/2 clonazepam and does not feel is helping.  She raised the question of taking lorazepam.  Discussed lorazepam and long term treatment.  Rx given for lorazepam .'5mg'$  to take prn - just to see if can level things out.  Continue citalopram.  Discussed further treatment.  Discussed referral to psychiatry.  She is agreeable.

## 2021-12-17 NOTE — Assessment & Plan Note (Signed)
On pravastatin.  Low cholesterol diet and exercise.  Follow lipid panel and liver function tests.   

## 2021-12-17 NOTE — Assessment & Plan Note (Signed)
Saw oncology 11/27/21 - recommended continuing taloxifen '10mg'$  q hs.  Also mammogram and Dexa 12/24/21.  Reports she stopped taking tamoxifen three days ago.  She was concerned this was contributing to her anxiety.  Discussed taking tamoxifen and benefits of therapy.  Will remain off for now - due to increased anxiety from taking the medication.  Will notify oncology.  Hopefully if we can get her anxiety controlled, can get her back on tamoxifen.

## 2021-12-18 ENCOUNTER — Other Ambulatory Visit: Payer: Self-pay | Admitting: Internal Medicine

## 2021-12-18 DIAGNOSIS — F419 Anxiety disorder, unspecified: Secondary | ICD-10-CM

## 2021-12-18 NOTE — Progress Notes (Signed)
Order placed psychiatry referral.

## 2021-12-22 ENCOUNTER — Telehealth: Payer: Self-pay | Admitting: Internal Medicine

## 2021-12-22 NOTE — Telephone Encounter (Signed)
Spoke with patient she req CB after 01/12/22

## 2021-12-24 ENCOUNTER — Ambulatory Visit
Admission: RE | Admit: 2021-12-24 | Discharge: 2021-12-24 | Disposition: A | Discharge status: Home / Self Care | Payer: Medicare Other | Source: Ambulatory Visit | Attending: Internal Medicine | Admitting: Internal Medicine

## 2021-12-24 ENCOUNTER — Ambulatory Visit
Admission: RE | Admit: 2021-12-24 | Discharge: 2021-12-24 | Disposition: A | Discharge status: Home / Self Care | Payer: Medicare Other | Source: Ambulatory Visit | Attending: Hematology | Admitting: Hematology

## 2021-12-24 DIAGNOSIS — C50411 Malignant neoplasm of upper-outer quadrant of right female breast: Secondary | ICD-10-CM | POA: Insufficient documentation

## 2021-12-24 DIAGNOSIS — R92323 Mammographic fibroglandular density, bilateral breasts: Secondary | ICD-10-CM | POA: Diagnosis not present

## 2021-12-24 DIAGNOSIS — M8589 Other specified disorders of bone density and structure, multiple sites: Secondary | ICD-10-CM | POA: Insufficient documentation

## 2021-12-24 DIAGNOSIS — Z923 Personal history of irradiation: Secondary | ICD-10-CM | POA: Diagnosis not present

## 2021-12-24 DIAGNOSIS — Z13820 Encounter for screening for osteoporosis: Secondary | ICD-10-CM | POA: Diagnosis not present

## 2021-12-24 DIAGNOSIS — Z780 Asymptomatic menopausal state: Secondary | ICD-10-CM | POA: Diagnosis not present

## 2021-12-24 DIAGNOSIS — Z853 Personal history of malignant neoplasm of breast: Secondary | ICD-10-CM

## 2021-12-24 DIAGNOSIS — Z170 Estrogen receptor positive status [ER+]: Secondary | ICD-10-CM | POA: Insufficient documentation

## 2021-12-30 ENCOUNTER — Telehealth: Payer: Self-pay

## 2021-12-30 NOTE — Telephone Encounter (Addendum)
Sent message to scheduling and called patient to relay message below. Will have them call patient with appointment time and date.        ----- Message from Truitt Merle, MD sent at 12/30/2021  1:29 PM EST ----- Please let pt know her DEXA results. She has osteopenia and is at high risk for fracture, especially inher hips. I recommend her to consider medication to strength her bones, such as zometa infusion. If she is interested, please schedule her a phone visit with me, thanks   Truitt Merle

## 2021-12-31 ENCOUNTER — Telehealth: Payer: Self-pay | Admitting: Internal Medicine

## 2021-12-31 ENCOUNTER — Telehealth: Payer: Self-pay

## 2021-12-31 NOTE — Telephone Encounter (Signed)
See note to Dr Nicki Reaper

## 2021-12-31 NOTE — Telephone Encounter (Signed)
Pt aware provider out of office - will receive return call tomorrow Pt agreeable - stated no problem

## 2021-12-31 NOTE — Telephone Encounter (Signed)
Pt would like to be called regarding the medications she is taking

## 2022-01-01 NOTE — Telephone Encounter (Signed)
Noted! Thank you

## 2022-01-01 NOTE — Telephone Encounter (Signed)
Per review, last visit, I had placed an order for referral to psychiatry - given that we have tried multiple medications.  Has she heard regarding an appt.  If has not heard, let me reach out and see if I can get an appt to discuss.   Since we have tried multiple medications and persistent anxiety, I would like to get psychiatry input - to see if we can get her feeling better.  Please document all the medications she is taking - speak to husband.  Want to make sure she has not stopped any.

## 2022-01-01 NOTE — Telephone Encounter (Signed)
S/w pt - has not heard from [facility] for update  Agreeable to wait for psych  Per pt and pt husband - pt takes the following daily AM:: 1- Potassium 2- Lorazepam 3- Pravastatin  PM:: 1-Potassium 2- Triamterene 3- Citalopram 4- Lorazepam

## 2022-01-02 NOTE — Telephone Encounter (Signed)
Pt called - stating she had a terrible night. Stated that she could not sleep, anxiety was very high.  Wondering if you could increase her medication or change it just until she gets in w/ Psych.

## 2022-01-02 NOTE — Telephone Encounter (Signed)
Advised patient of what Dr. Nicki Reaper Stated to take 1/1/2 at bedtime.

## 2022-01-02 NOTE — Telephone Encounter (Signed)
LM for pt to cb to advise below

## 2022-01-02 NOTE — Telephone Encounter (Signed)
Will try to see if can get urgent referral to psychiatry.  She can try 1 1/2 tablet before bed - if needed.  Follow closely.  I

## 2022-01-02 NOTE — Telephone Encounter (Signed)
Patient requesting a return fall call.

## 2022-01-05 NOTE — Telephone Encounter (Signed)
Her appt is not until beginning to mid of February

## 2022-01-05 NOTE — Telephone Encounter (Signed)
Would prefer to hold on increasing lorazepam more.  When is her appt with psychiatry.

## 2022-01-05 NOTE — Telephone Encounter (Signed)
Pt has been informed and has verbalized understanding pt wanted to thank you.

## 2022-01-05 NOTE — Telephone Encounter (Signed)
Pt states she started taking the lorazepam 1.5 tablets in the pm and she wants to know should she increase the am dosage to 1.5 as well because she gets real shaky in the am.

## 2022-01-05 NOTE — Telephone Encounter (Signed)
Pt called wanting to talk to the provider about her lozarepam

## 2022-01-05 NOTE — Telephone Encounter (Signed)
Let her know that I will reach out to Dr Shea Evans and see if we can get an earlier appt.

## 2022-01-06 ENCOUNTER — Other Ambulatory Visit: Payer: Self-pay | Admitting: Internal Medicine

## 2022-01-06 NOTE — Telephone Encounter (Addendum)
Pt called wanting a callback in regards to her medication increase

## 2022-01-06 NOTE — Telephone Encounter (Signed)
Please call and notify Jennifer [name] that I have contacted Dr Shea Evans and they are going to see if they can schedule an earlier appt.  Keep me posted on how doing.  Will follow.  If feels needs something prior to appt with psychiatry, may need to add buspar.

## 2022-01-07 MED ORDER — BUSPIRONE HCL 5 MG PO TABS: 5.0000 mg | ORAL_TABLET | Freq: Every day | ORAL | 1 refills | Status: DC

## 2022-01-07 NOTE — Telephone Encounter (Signed)
Rx sent in for buspar '5mg'$  #30 with no refills.

## 2022-01-07 NOTE — Telephone Encounter (Signed)
Pt would like to be called in regards to her anxiety medication not working

## 2022-01-07 NOTE — Telephone Encounter (Signed)
S/w pt - stated has been shaking all day. Started crying, stated she is tired of all this.  Stated she would like to try to add the buspar to her regiment for relief during the day.   Ok to send?

## 2022-01-07 NOTE — Addendum Note (Signed)
Addended by: Alisa Graff on: 01/07/2022 03:32 PM   Modules accepted: Orders

## 2022-01-08 NOTE — Telephone Encounter (Signed)
Pt advised Will call with update

## 2022-01-13 DIAGNOSIS — J449 Chronic obstructive pulmonary disease, unspecified: Secondary | ICD-10-CM | POA: Diagnosis not present

## 2022-01-26 ENCOUNTER — Telehealth: Payer: Self-pay | Admitting: Internal Medicine

## 2022-01-26 ENCOUNTER — Other Ambulatory Visit: Payer: Self-pay

## 2022-01-26 NOTE — Telephone Encounter (Signed)
Pt called back asking for Marissa. I read the note below to her about her med refill. She still has some question/concern on how to take it.

## 2022-01-26 NOTE — Telephone Encounter (Signed)
Has been referred to psychiatry.  Need to clarify if has seen or if has a scheduled appt.  Also , how often is she taking lorazepam.  Still needing as often with the addition of buspar recently?

## 2022-01-26 NOTE — Telephone Encounter (Signed)
S/w Nolyn - stated has been taking Buspar everyday. Is still needing Lorazepam. Confirmed taking 1.5 in the AM and 1.5 PM. Pt states still does not have much relief during the day, but it helps her sleep well at night. Pt states she is still very shaky, not sure if tremors or anxiety. Worried about getting sick due to low immune system, so does not leave house unless she has to.

## 2022-01-26 NOTE — Telephone Encounter (Signed)
Sent to dr Nicki Reaper for approval

## 2022-01-26 NOTE — Telephone Encounter (Signed)
Prescription Request  01/26/2022  Is this a "Controlled Substance" medicine? Yes  LOV: 12/16/2021  What is the name of the medication or equipment? LORazepam (ATIVAN) 0.5 MG tablet Patient is out of medication  Have you contacted your pharmacy to request a refill? No   Which pharmacy would you like this sent to?  [place], [place] - [address] [place] [address] Phone: [phone] Fax: [phone]    Patient notified that their request is being sent to the clinical staff for review and that they should receive a response within 2 business days.   Please advise at Mobile [phone] (mobile)

## 2022-01-26 NOTE — Telephone Encounter (Signed)
Patient called office, note was read. Yes, she has an appointment with psychiatry in February. She has been taking 1 1/2 pills lorazepam in the morning and 1 1/2 pills in the evening. Taking 2 pills a day was not working for her, that is why she is  out of medication.

## 2022-01-27 MED ORDER — LORAZEPAM 0.5 MG PO TABS: ORAL_TABLET | ORAL | 0 refills | Status: DC

## 2022-01-27 NOTE — Telephone Encounter (Signed)
Rx ok'd for lorazepam.  Planning to establish care with psychiatry in 02/2022.  Trying to get earlier appt

## 2022-01-27 NOTE — Telephone Encounter (Signed)
Medication sent in. 

## 2022-02-03 ENCOUNTER — Other Ambulatory Visit: Payer: Self-pay | Admitting: Internal Medicine

## 2022-02-06 ENCOUNTER — Other Ambulatory Visit: Payer: Self-pay | Admitting: Internal Medicine

## 2022-02-10 ENCOUNTER — Encounter: Payer: Self-pay | Admitting: Internal Medicine

## 2022-02-10 ENCOUNTER — Ambulatory Visit (INDEPENDENT_AMBULATORY_CARE_PROVIDER_SITE_OTHER): Payer: Medicare Other | Admitting: Internal Medicine

## 2022-02-10 VITALS — BP 120/76 | HR 78 | Temp 97.9°F | Resp 16 | Ht 61.0 in | Wt 131.0 lb

## 2022-02-10 DIAGNOSIS — I10 Essential (primary) hypertension: Secondary | ICD-10-CM | POA: Diagnosis not present

## 2022-02-10 DIAGNOSIS — Z170 Estrogen receptor positive status [ER+]: Secondary | ICD-10-CM

## 2022-02-10 DIAGNOSIS — J4520 Mild intermittent asthma, uncomplicated: Secondary | ICD-10-CM

## 2022-02-10 DIAGNOSIS — F419 Anxiety disorder, unspecified: Secondary | ICD-10-CM | POA: Diagnosis not present

## 2022-02-10 DIAGNOSIS — C50411 Malignant neoplasm of upper-outer quadrant of right female breast: Secondary | ICD-10-CM

## 2022-02-10 DIAGNOSIS — E7800 Pure hypercholesterolemia, unspecified: Secondary | ICD-10-CM

## 2022-02-10 DIAGNOSIS — R739 Hyperglycemia, unspecified: Secondary | ICD-10-CM

## 2022-02-10 DIAGNOSIS — Z86010 Personal history of colonic polyps: Secondary | ICD-10-CM | POA: Diagnosis not present

## 2022-02-10 DIAGNOSIS — K2100 Gastro-esophageal reflux disease with esophagitis, without bleeding: Secondary | ICD-10-CM | POA: Diagnosis not present

## 2022-02-10 DIAGNOSIS — E876 Hypokalemia: Secondary | ICD-10-CM | POA: Diagnosis not present

## 2022-02-10 DIAGNOSIS — J984 Other disorders of lung: Secondary | ICD-10-CM | POA: Diagnosis not present

## 2022-02-10 LAB — HEPATIC FUNCTION PANEL
ALT: 13 U/L (ref 0–35)
AST: 19 U/L (ref 0–37)
Albumin: 4.2 g/dL (ref 3.5–5.2)
Alkaline Phosphatase: 45 U/L (ref 39–117)
Bilirubin, Direct: 0.1 mg/dL (ref 0.0–0.3)
Total Bilirubin: 0.6 mg/dL (ref 0.2–1.2)
Total Protein: 6.2 g/dL (ref 6.0–8.3)

## 2022-02-10 LAB — LIPID PANEL
Cholesterol: 172 mg/dL (ref 0–200)
HDL: 67.9 mg/dL (ref >39.00)
LDL Cholesterol: 86 mg/dL (ref 0–99)
NonHDL: 103.99
Total CHOL/HDL Ratio: 3
Triglycerides: 91 mg/dL (ref 0.0–149.0)
VLDL: 18.2 mg/dL (ref 0.0–40.0)

## 2022-02-10 LAB — BASIC METABOLIC PANEL
BUN: 11 mg/dL (ref 6–23)
CO2: 34 mEq/L — ABNORMAL HIGH (ref 19–32)
Calcium: 9.3 mg/dL (ref 8.4–10.5)
Chloride: 98 mEq/L (ref 96–112)
Creatinine, Ser: 0.9 mg/dL (ref 0.40–1.20)
GFR: 62.74 mL/min (ref >60.00)
Glucose, Bld: 87 mg/dL (ref 70–99)
Potassium: 3.3 mEq/L — ABNORMAL LOW (ref 3.5–5.1)
Sodium: 141 mEq/L (ref 135–145)

## 2022-02-10 LAB — HEMOGLOBIN A1C: Hgb A1c MFr Bld: 5.5 % (ref 4.6–6.5)

## 2022-02-10 MED ORDER — CITALOPRAM HYDROBROMIDE 40 MG PO TABS: 40.0000 mg | ORAL_TABLET | Freq: Every day | ORAL | 0 refills | Status: DC

## 2022-02-10 MED ORDER — BUSPIRONE HCL 5 MG PO TABS: 5.0000 mg | ORAL_TABLET | Freq: Every day | ORAL | 0 refills | Status: DC

## 2022-02-10 MED ORDER — PRAVASTATIN SODIUM 20 MG PO TABS: 20.0000 mg | ORAL_TABLET | Freq: Every day | ORAL | 1 refills | Status: DC

## 2022-02-10 NOTE — Progress Notes (Signed)
[name], female   DOB: [birth year], 77 y.o.   MRN: [id number]   Subjective:    [name], female    DOB: 01/05/1946, 77 y.o.   MRN: [id number]   Patient here for  Chief Complaint  Patient presents with   Medical Management of Chronic Issues   .   HPI Here to follow up regarding increased stress/anxiety.  She is accompanied by her husband.  History obtained from both of them.  Recently diagnosed with breast cancer.  Since recommendation to start tamoxifen, she has experienced increased anxiety.  Off tamoxifen now.   Was previously concerned that her clonazepam was not helping.  When we tried to taper clonazepam and start remeron, she had increased anxiety.  Restarted clonazepam - to help level things back out. She returned last visit stating she felt clonazepam was not helping.   Requested to be started on lorazepam.  Breathing stable.  No chest pain reported. She is taking lorazepam bid now.  Continues on buspar and celexa.  Currently appears to be doing better on this regimen.  Has an appt with psychiatry scheduled for next month.  Discussed continuing her medication as she is doing for now, especially since doing some better.  Discussed hopefully will be able to get her down and eventually off lorazepam.     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 Gery Pray   Hypertension    PONV (postoperative nausea and vomiting)    Scoliosis    Ulcer    Past Surgical History:  Procedure Laterality Date   ABDOMINAL HYSTERECTOMY     ANTERIOR CERVICAL DECOMP/DISCECTOMY FUSION N/A 04/19/2017   Procedure: Anterior Cervical Decompression Fusion Cervical  Three-Four, Cervical Four-Five, Cervical Five-Six;  Surgeon: Kary Kos, MD;  Location: [facility];  Service: Neurosurgery;  Laterality: N/A;  Anterior Cervical Decompression Fusion Cervical Three-Four, Cervical Four-Five, Cervical Five-Six   BREAST BIOPSY Right 11/14/2020   [place] Bx, Venus Clip, Invasive Mammary Carcinoma   BREAST LUMPECTOMY Right 2022   With radiation   BREAST LUMPECTOMY WITH RADIOACTIVE SEED AND SENTINEL LYMPH NODE BIOPSY Right 12/27/2020   Procedure: RIGHT BREAST LUMPECTOMY WITH RADIOACTIVE SEED AND AXILLARY SENTINEL LYMPH NODE BIOPSY;  Surgeon: Rolm Bookbinder, MD;  Location: [facility];  Service: General;  Laterality: Right;   [place]   LAPAROSCOPIC REMOVAL OF MESENTERIC MASS     LUMBAR [place]   [facility]   PARTIAL HYSTERECTOMY  1981   prolapsed uterus   TUBAL LIGATION  1978   Family History  Problem Relation Age of Onset   Stroke Mother    Cancer Father        unknown type   Cancer Brother        "blood cancer"   CVA Maternal Grandmother    Healthy Son    Breast cancer Neg Hx    Colon cancer Neg Hx    Social History   Socioeconomic History   Marital status: Married    Spouse name: Marchia Bond "  [name]"   Number of children: 2   Years of education: Not on file   Highest education level: Not on file  Occupational History   Occupation: retired Pharmacist, hospital  Tobacco Use   Smoking status: Never   Smokeless tobacco: Never  Vaping Use   Vaping Use: Never used  Substance and Sexual Activity   Alcohol use: No    Alcohol/week: 0.0 standard drinks of alcohol   Drug use: No   Sexual activity: Not Currently  Other Topics Concern   Not on file  Social History Narrative   Right handed    Lives with husband   Had breast cancer surgery right side   Worked in the school system    Social Determinants of Health   Financial Resource Strain: Low Risk  (02/13/2022)   Overall Financial Resource Strain (CARDIA)     Difficulty of Paying Living Expenses: Not hard at all  Food Insecurity: No Food Insecurity (02/13/2022)   Hunger Vital Sign    Worried About Running Out of Food in the Last Year: Never true    [place] in the Last Year: Never true  Transportation Needs: No Transportation Needs (02/13/2022)   PRAPARE - Hydrologist (Medical): No    Lack of Transportation (Non-Medical): No  Physical Activity: Unknown (02/13/2022)   Exercise Vital Sign    Days of Exercise per Week: 0 days    Minutes of Exercise per Session: Not on file  Stress: No Stress Concern Present (02/13/2022)   [place]    Feeling of Stress : Only a little  Social Connections: Unknown (02/13/2022)   Social Connection and Isolation Panel [NHANES]    Frequency of Communication with Friends and Family: More than three times a week    Frequency of Social Gatherings with Friends and Family: Not on file    Attends Religious Services: Not on file    Active Member of Clubs or Organizations: Not on file    Attends Archivist Meetings: Not on file    Marital Status: Married     Review of Systems  Constitutional:  Negative for fever and unexpected weight change.       Has lost weight.   HENT:  Negative for congestion and sinus pressure.   Respiratory:  Negative for cough, chest tightness and shortness of breath.   Cardiovascular:  Negative for chest pain, palpitations and leg swelling.  Gastrointestinal:  Negative for abdominal pain, diarrhea, nausea and vomiting.  Genitourinary:  Negative for difficulty urinating and dysuria.  Musculoskeletal:  Negative for joint swelling and myalgias.  Skin:  Negative for color change and rash.  Neurological:  Negative for dizziness and headaches.  Psychiatric/Behavioral:  Negative for agitation and dysphoric mood.        Increased anxiety and stress.  Overall improved.        Objective:      BP 120/76   Pulse 78   Temp 97.9 F (36.6 C)   Resp 16   Ht '5\' 1"'$  (1.549 m)   Wt 131 lb (59.4 kg)   SpO2 96%   BMI 24.75 kg/m  Wt Readings from Last 3 Encounters:  02/13/22 131 lb (59.4 kg)  02/10/22 131 lb (59.4 kg)  12/16/21 134 lb (60.8 kg)    Physical Exam Vitals reviewed.  Constitutional:      General: She is not in acute distress.    Appearance: Normal appearance.  HENT:     Head: Normocephalic and atraumatic.     Right Ear: External ear normal.     Left Ear: External ear normal.  Eyes:     General: No scleral icterus.       Right eye: No discharge.        Left eye: No discharge.     Conjunctiva/sclera: Conjunctivae normal.  Neck:     Thyroid: No thyromegaly.  Cardiovascular:     Rate and Rhythm: Normal rate and regular rhythm.  Pulmonary:     Effort: No respiratory distress.     Breath sounds: Normal breath sounds. No wheezing.  Abdominal:     General: Bowel sounds are normal.     Palpations: Abdomen is soft.     Tenderness: There is no abdominal tenderness.  Musculoskeletal:        General: No swelling or tenderness.     Cervical back: Neck supple. No tenderness.  Lymphadenopathy:     Cervical: No cervical adenopathy.  Skin:    Findings: No erythema or rash.  Neurological:     Mental Status: She is alert.  Psychiatric:        Mood and Affect: Mood normal.        Behavior: Behavior normal.      Outpatient Encounter Medications as of 02/10/2022  Medication Sig   busPIRone (BUSPAR) 5 MG tablet Take 1 tablet (5 mg total) by mouth daily.   citalopram (CELEXA) 40 MG tablet Take 1 tablet (40 mg total) by mouth daily.   Cyanocobalamin (B-12) 5000 MCG CAPS Take 5,000 Units by mouth daily. Metabolism support   LORazepam (ATIVAN) 0.5 MG tablet Take 1.5 tablets bid.   pravastatin (PRAVACHOL) 20 MG tablet Take 1 tablet (20 mg total) by mouth daily.   tamoxifen (NOLVADEX) 10 MG tablet Take 1 tablet (10 mg total) by mouth daily. (Patient not taking:  Reported on 02/10/2022)   Tiotropium Bromide-Olodaterol (STIOLTO RESPIMAT) 2.5-2.5 MCG/ACT AERS Inhale 1 puff into the lungs daily.   triamterene-hydrochlorothiazide (MAXZIDE-25) 37.5-25 MG tablet Take 1 tablet by mouth daily.   [DISCONTINUED] busPIRone (BUSPAR) 5 MG tablet Take 1 tablet (5 mg total) by mouth daily.   [DISCONTINUED] citalopram (CELEXA) 40 MG tablet Take 1 tablet (40 mg total) by mouth daily.   [DISCONTINUED] potassium chloride (KLOR-CON M) 10 MEQ tablet Take one tablet by mouth twice daily.   [DISCONTINUED] pravastatin (PRAVACHOL) 20 MG tablet Take 1 tablet (20 mg total) by mouth daily.   No facility-administered encounter medications on file as of 02/10/2022.     Lab Results  Component Value Date   WBC 7.9 10/07/2021   HGB 13.5 10/07/2021   HCT 39.3 10/07/2021   PLT 193.0 10/07/2021   GLUCOSE 87 02/10/2022   CHOL 172 02/10/2022   TRIG 91.0 02/10/2022   HDL 67.90 02/10/2022   LDLDIRECT 148.4 11/03/2012   LDLCALC 86 02/10/2022   ALT 13 02/10/2022   AST 19 02/10/2022   NA 141 02/10/2022   K 3.3 (L) 02/10/2022   CL 98 02/10/2022   CREATININE 0.90 02/10/2022   BUN 11 02/10/2022   CO2 34 (H) 02/10/2022   TSH 0.93 12/16/2021   HGBA1C 5.5 02/10/2022       Assessment & Plan:   Problem List Items Addressed This Visit     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female, estrogen receptor positive ([place])    Saw oncology 11/27/21 - recommended continuing taloxifen '10mg'$  q hs.  Also mammogram and  Dexa 12/24/21.  Off tamoxifen. She was concerned this was contributing to her anxiety.  Continue f/u with oncology.        Hypokalemia    Taking potassium supplements.  Recheck potassium today.       Hyperglycemia    Follow met b and A1c.       Relevant Orders   Hemoglobin A1c (Completed)   Hypercholesterolemia    On pravastatin.  Low cholesterol diet and exercise.  Follow lipid panel and liver function tests.        Relevant Medications    pravastatin (PRAVACHOL) 20 MG tablet   Other Relevant Orders   Hepatic function panel (Completed)   Lipid panel (Completed)   History of colonic polyps    Colonoscopy 02/2016.       GERD (gastroesophageal reflux disease)    No upper symptoms reported.  Continue PPI.       Essential hypertension, benign - Primary    Blood pressure as outlined.  On triam/hctz.  Follow pressures.  Follow metabolic panel.       Relevant Medications   pravastatin (PRAVACHOL) 20 MG tablet   Other Relevant Orders   Basic metabolic panel (Completed)   Chronic restrictive lung disease    Breathing stable.       Asthma    Continue stiolto.  Breathing doing well.  Follow       Anxiety    Continues on citalopram and lorazepam.  She is doing some better.    Has an appt with psychiatry scheduled for next month.  Discussed continuing her medication as she is doing for now, especially since doing some better.  Discussed hopefully will be able to get her down and eventually off lorazepam.       Relevant Medications   busPIRone (BUSPAR) 5 MG tablet   citalopram (CELEXA) 40 MG tablet    [name], MD

## 2022-02-11 ENCOUNTER — Other Ambulatory Visit: Payer: Self-pay

## 2022-02-11 DIAGNOSIS — E876 Hypokalemia: Secondary | ICD-10-CM

## 2022-02-11 MED ORDER — POTASSIUM CHLORIDE CRYS ER 10 MEQ PO TBCR: EXTENDED_RELEASE_TABLET | ORAL | 1 refills | Status: DC

## 2022-02-13 ENCOUNTER — Ambulatory Visit (INDEPENDENT_AMBULATORY_CARE_PROVIDER_SITE_OTHER): Payer: Medicare Other

## 2022-02-13 VITALS — Ht 61.0 in | Wt 131.0 lb

## 2022-02-13 DIAGNOSIS — Z0000 Encounter for general adult medical examination without abnormal findings: Secondary | ICD-10-CM

## 2022-02-13 DIAGNOSIS — J449 Chronic obstructive pulmonary disease, unspecified: Secondary | ICD-10-CM | POA: Diagnosis not present

## 2022-02-13 NOTE — Patient Instructions (Addendum)
Jennifer [name] , Thank you for taking time to come for your Medicare Wellness Visit. I appreciate your ongoing commitment to your health goals. Please review the following plan we discussed and let me know if I can assist you in the future.   These are the goals we discussed:  Goals Addressed               This Visit's Progress     Patient Stated     Increase physical activity (pt-stated)   On track     Walk for exercise as tolerated Monitor steps         This is a list of the screening recommended for you and due dates:  Health Maintenance  Topic Date Due   DTaP/Tdap/Td vaccine (1 - Tdap) Never done   COVID-19 Vaccine (5 - 2023-24 season) 02/26/2022*   Pneumonia Vaccine (2 - PPSV23 or PCV20) 06/07/2022*   Colon Cancer Screening  10/08/2022*   Mammogram  12/25/2022   Medicare Annual Wellness Visit  02/14/2023   Flu Shot  Completed   DEXA scan (bone density measurement)  Completed   Hepatitis C Screening: USPSTF Recommendation to screen - Ages 18-79 yo.  Completed   Zoster (Shingles) Vaccine  Completed   HPV Vaccine  Aged Out  *Topic was postponed. The date shown is not the original due date.    Advanced directives: End of life planning; Advance aging; Advanced directives discussed.  Copy of current HCPOA/Living Will requested.    Conditions/risks identified: none new  Next appointment: Follow up in one year for your annual wellness visit    Preventive Care 65 Years and Older, Female Preventive care refers to lifestyle choices and visits with your health care provider that can promote health and wellness. What does preventive care include? A yearly physical exam. This is also called an annual well check. Dental exams once or twice a year. Routine eye exams. Ask your health care provider how often you should have your eyes checked. Personal lifestyle choices, including: Daily care of your teeth and gums. Regular physical activity. Eating a healthy diet. Avoiding  tobacco and drug use. Limiting alcohol use. Practicing safe sex. Taking low-dose aspirin every day. Taking vitamin and mineral supplements as recommended by your health care provider. What happens during an annual well check? The services and screenings done by your health care provider during your annual well check will depend on your age, overall health, lifestyle risk factors, and family history of disease. Counseling  Your health care provider may ask you questions about your: Alcohol use. Tobacco use. Drug use. Emotional well-being. Home and relationship well-being. Sexual activity. Eating habits. History of falls. Memory and ability to understand (cognition). Work and work Statistician. Reproductive health. Screening  You may have the following tests or measurements: Height, weight, and BMI. Blood pressure. Lipid and cholesterol levels. These may be checked every 5 years, or more frequently if you are over 36 years old. Skin check. Lung cancer screening. You may have this screening every year starting at age 76 if you have a 30-pack-year history of smoking and currently smoke or have quit within the past 15 years. Fecal occult blood test (FOBT) of the stool. You may have this test every year starting at age 49. Flexible sigmoidoscopy or colonoscopy. You may have a sigmoidoscopy every 5 years or a colonoscopy every 10 years starting at age 7. Hepatitis C blood test. Hepatitis B blood test. Sexually transmitted disease (STD) testing. Diabetes screening. This is  done by checking your blood sugar (glucose) after you have not eaten for a while (fasting). You may have this done every 1-3 years. Bone density scan. This is done to screen for osteoporosis. You may have this done starting at age 55. Mammogram. This may be done every 1-2 years. Talk to your health care provider about how often you should have regular mammograms. Talk with your health care provider about your test  results, treatment options, and if necessary, the need for more tests. Vaccines  Your health care provider may recommend certain vaccines, such as: Influenza vaccine. This is recommended every year. Tetanus, diphtheria, and acellular pertussis (Tdap, Td) vaccine. You may need a Td booster every 10 years. Zoster vaccine. You may need this after age 82. Pneumococcal 13-valent conjugate (PCV13) vaccine. One dose is recommended after age 64. Pneumococcal polysaccharide (PPSV23) vaccine. One dose is recommended after age 49. Talk to your health care provider about which screenings and vaccines you need and how often you need them. This information is not intended to replace advice given to you by your health care provider. Make sure you discuss any questions you have with your health care provider. Document Released: 02/01/2015 Document Revised: 09/25/2015 Document Reviewed: 11/06/2014 Elsevier Interactive Patient Education  2017 [place] Prevention in the Home Falls can cause injuries. They can happen to people of all ages. There are many things you can do to make your home safe and to help prevent falls. What can I do on the outside of my home? Regularly fix the edges of walkways and driveways and fix any cracks. Remove anything that might make you trip as you walk through a door, such as a raised step or threshold. Trim any bushes or trees on the path to your home. Use bright outdoor lighting. Clear any walking paths of anything that might make someone trip, such as rocks or tools. Regularly check to see if handrails are loose or broken. Make sure that both sides of any steps have handrails. Any raised decks and porches should have guardrails on the edges. Have any leaves, snow, or ice cleared regularly. Use sand or salt on walking paths during winter. Clean up any spills in your garage right away. This includes oil or grease spills. What can I do in the bathroom? Use night  lights. Install grab bars by the toilet and in the tub and shower. Do not use towel bars as grab bars. Use non-skid mats or decals in the tub or shower. If you need to sit down in the shower, use a plastic, non-slip stool. Keep the floor dry. Clean up any water that spills on the floor as soon as it happens. Remove soap buildup in the tub or shower regularly. Attach bath mats securely with double-sided non-slip rug tape. Do not have throw rugs and other things on the floor that can make you trip. What can I do in the bedroom? Use night lights. Make sure that you have a light by your bed that is easy to reach. Do not use any sheets or blankets that are too big for your bed. They should not hang down onto the floor. Have a firm chair that has side arms. You can use this for support while you get dressed. Do not have throw rugs and other things on the floor that can make you trip. What can I do in the kitchen? Clean up any spills right away. Avoid walking on wet floors. Keep items that you  use a lot in easy-to-reach places. If you need to reach something above you, use a strong step stool that has a grab bar. Keep electrical cords out of the way. Do not use floor polish or wax that makes floors slippery. If you must use wax, use non-skid floor wax. Do not have throw rugs and other things on the floor that can make you trip. What can I do with my stairs? Do not leave any items on the stairs. Make sure that there are handrails on both sides of the stairs and use them. Fix handrails that are broken or loose. Make sure that handrails are as long as the stairways. Check any carpeting to make sure that it is firmly attached to the stairs. Fix any carpet that is loose or worn. Avoid having throw rugs at the top or bottom of the stairs. If you do have throw rugs, attach them to the floor with carpet tape. Make sure that you have a light switch at the top of the stairs and the bottom of the stairs. If  you do not have them, ask someone to add them for you. What else can I do to help prevent falls? Wear shoes that: Do not have high heels. Have rubber bottoms. Are comfortable and fit you well. Are closed at the toe. Do not wear sandals. If you use a stepladder: Make sure that it is fully opened. Do not climb a closed stepladder. Make sure that both sides of the stepladder are locked into place. Ask someone to hold it for you, if possible. Clearly mark and make sure that you can see: Any grab bars or handrails. First and last steps. Where the edge of each step is. Use tools that help you move around (mobility aids) if they are needed. These include: Canes. Walkers. Scooters. Crutches. Turn on the lights when you go into a dark area. Replace any light bulbs as soon as they burn out. Set up your furniture so you have a clear path. Avoid moving your furniture around. If any of your floors are uneven, fix them. If there are any pets around you, be aware of where they are. Review your medicines with your doctor. Some medicines can make you feel dizzy. This can increase your chance of falling. Ask your doctor what other things that you can do to help prevent falls. This information is not intended to replace advice given to you by your health care provider. Make sure you discuss any questions you have with your health care provider. Document Released: 11/01/2008 Document Revised: 06/13/2015 Document Reviewed: 02/09/2014 Elsevier Interactive Patient Education  2017 [employer].

## 2022-02-13 NOTE — Progress Notes (Signed)
Subjective:   Jennifer [name] is a 77 y.o. female who presents for Medicare Annual (Subsequent) preventive examination.  Review of Systems    No ROS.  Medicare Wellness Virtual Visit.  Visual/audio telehealth visit, UTA vital signs.   See social history for additional risk factors.   Cardiac Risk Factors include: advanced age (>43mn, >>71women)     Objective:    Today's Vitals   02/13/22 1634  Weight: 131 lb (59.4 kg)  Height: '5\' 1"'$  (1.549 m)   Body mass index is 24.75 kg/m.     02/13/2022    3:34 PM 06/26/2021    1:40 PM 03/31/2021   10:13 AM 01/27/2021    1:36 PM 12/25/2020    2:49 PM 12/23/2020    1:56 PM 12/19/2020    2:23 PM  Advanced Directives  Does Patient Have a Medical Advance Directive? Yes Yes Yes Yes Yes Yes Yes  Type of AParamedicof [place] will Living will  [place] will  [place] will Living will  Does patient want to make changes to medical advance directive? No - Patient declined  No - Patient declined No - Patient declined  No - Patient declined No - Patient declined  Copy of [place] Chart? No - copy requested   No - copy requested  No - copy requested     Current Medications (verified) Outpatient Encounter Medications as of 02/13/2022  Medication Sig   busPIRone (BUSPAR) 5 MG tablet Take 1 tablet (5 mg total) by mouth daily.   citalopram (CELEXA) 40 MG tablet Take 1 tablet (40 mg total) by mouth daily.   Cyanocobalamin (B-12) 5000 MCG CAPS Take 5,000 Units by mouth daily. Metabolism support   LORazepam (ATIVAN) 0.5 MG tablet Take 1.5 tablets bid.   potassium chloride (KLOR-CON M) 10 MEQ tablet Take one tablet by mouth once daily.   pravastatin (PRAVACHOL) 20 MG tablet Take 1 tablet (20 mg total) by mouth daily.   tamoxifen (NOLVADEX) 10 MG tablet Take 1 tablet (10 mg total) by mouth daily. (Patient not taking: Reported on 02/10/2022)   Tiotropium  Bromide-Olodaterol (STIOLTO RESPIMAT) 2.5-2.5 MCG/ACT AERS Inhale 1 puff into the lungs daily.   triamterene-hydrochlorothiazide (MAXZIDE-25) 37.5-25 MG tablet Take 1 tablet by mouth daily.   No facility-administered encounter medications on file as of 02/13/2022.    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 JGery Pray  Hypertension    PONV (postoperative nausea and vomiting)    Scoliosis    Ulcer    Past Surgical History:  Procedure Laterality Date   ABDOMINAL HYSTERECTOMY     ANTERIOR CERVICAL DECOMP/DISCECTOMY FUSION N/A 04/19/2017   Procedure: Anterior Cervical Decompression Fusion Cervical Three-Four, Cervical Four-Five, Cervical Five-Six;  Surgeon: CKary Kos MD;  [place] [facility]  Service: Neurosurgery;  Laterality: N/A;  Anterior Cervical Decompression Fusion Cervical Three-Four, Cervical Four-Five, Cervical Five-Six   BREAST BIOPSY Right 11/14/2020   [place]x, [place] Clip, Invasive Mammary Carcinoma  BREAST LUMPECTOMY Right 2022   With radiation   BREAST LUMPECTOMY WITH RADIOACTIVE SEED AND SENTINEL LYMPH NODE BIOPSY Right 12/27/2020   Procedure: RIGHT BREAST LUMPECTOMY WITH RADIOACTIVE SEED AND AXILLARY SENTINEL LYMPH NODE BIOPSY;  Surgeon: Rolm Bookbinder, MD;  Location: [facility];  Service: General;  Laterality: Right;   [place]   LAPAROSCOPIC REMOVAL OF MESENTERIC MASS     LUMBAR [place]   [facility]   PARTIAL HYSTERECTOMY  1981   prolapsed uterus   TUBAL LIGATION  1978   Family History  Problem Relation Age of  Onset   Stroke Mother    Cancer Father        unknown type   Cancer Brother        "blood cancer"   CVA Maternal Grandmother    Healthy Son    Breast cancer Neg Hx    Colon cancer Neg Hx    Social History   Socioeconomic History   Marital status: Married    Spouse name: Marchia Bond "[name]"   Number of children: 2   Years of education: Not on file   Highest education level: Not on file  Occupational History   Occupation: retired Pharmacist, hospital  Tobacco Use   Smoking status: Never   Smokeless tobacco: Never  Vaping Use   Vaping Use: Never used  Substance and Sexual Activity   Alcohol use: No    Alcohol/week: 0.0 standard drinks of alcohol   Drug use: No   Sexual activity: Not Currently  Other Topics Concern   Not on file  Social History Narrative   Right handed    Lives with husband   Had breast cancer surgery right side   Worked in the school system    Social Determinants of Health   Financial Resource Strain: Low Risk  (02/13/2022)   Overall Financial Resource Strain (CARDIA)    Difficulty of Paying Living Expenses: Not hard at all  Food Insecurity: No Food Insecurity (02/13/2022)   Hunger Vital Sign    Worried About Running Out of Food in the Last Year: Never true    Ran Out of Food in the Last Year: Never true  Transportation Needs: No Transportation Needs (02/13/2022)   PRAPARE - Hydrologist (Medical): No    Lack of Transportation (Non-Medical): No  Physical Activity: Unknown (02/13/2022)   Exercise Vital Sign    Days of Exercise per Week: 0 days    Minutes of Exercise per Session: Not on file  Stress: No Stress Concern Present (02/13/2022)   [place]    Feeling of Stress : Only a little  Social Connections: Unknown (02/13/2022)   Social Connection and Isolation Panel [NHANES]    Frequency of Communication with Friends and Family: More than three times a week    Frequency of  Social Gatherings with Friends and Family: Not on file    Attends Religious Services: Not on Advertising copywriter or Organizations: Not on file    Attends Archivist Meetings: Not on file    Marital Status: Married    Tobacco Counseling Counseling given: Not Answered   Clinical Intake:  Pre-visit preparation completed: Yes        Diabetes: No  How often do you need to have someone help you when you read instructions, pamphlets, or other written materials from  your doctor or pharmacy?: 1 - Never    Interpreter Needed?: No      Activities of Daily Living    02/13/2022    3:36 PM  In your present state of health, do you have any difficulty performing the following activities:  Hearing? 0  Vision? 0  Difficulty concentrating or making decisions? 0  Walking or climbing stairs? 0  Dressing or bathing? 0  Doing errands, shopping? 1  Comment [place] and eating ? N  Using the Toilet? N  In the past six months, have you accidently leaked urine? N  Do you have problems with loss of bowel control? N  Managing your Medications? N  Managing your Finances? N  Housekeeping or managing your Housekeeping? N    Patient Care Team: [name], MD as PCP - General (Internal Medicine) Gery Pray, MD as Consulting Physician (Radiation Oncology) Truitt Merle, MD as Consulting Physician (Hematology and Oncology) Rolm Bookbinder, MD as Consulting Physician (General Surgery) Tat, Eustace Quail, DO as Consulting Physician (Neurology)  Indicate any recent Medical Services you may have received from other than [facility] providers in the past year (date may be approximate).     Assessment:   This is a routine wellness examination for Jennifer [name].  I connected with  Jennifer [name] on 02/13/22 by a audio enabled telemedicine application and verified that I am speaking with the correct person using two identifiers.  Patient Location: Home  Provider  Location: Office/Clinic  I discussed the limitations of evaluation and management by telemedicine. The patient expressed understanding and agreed to proceed.   Hearing/Vision screen Hearing Screening - Comments:: Patient is able to hear conversational tones without difficulty. No issues reported.  Vision Screening - Comments:: Followed by [facility]  Wears corrective lenses  They have regular follow up with the ophthalmologist    Dietary issues and exercise activities discussed: Current Exercise Habits: Home exercise routine, Intensity: Mild Healthy diet Good water intake   Goals Addressed               This Visit's Progress     Patient Stated     Increase physical activity (pt-stated)   On track     Walk for exercise as tolerated Monitor steps       Depression Screen    02/13/2022    3:38 PM 12/16/2021   12:02 PM 11/10/2021    2:01 PM 10/29/2021   11:11 AM 10/07/2021   12:55 PM 07/04/2021   11:39 AM 09/10/2020    3:40 PM  PHQ 2/9 Scores  PHQ - 2 Score 0 0 0 1 0 0 0    Fall Risk    02/13/2022    3:35 PM 12/16/2021   12:02 PM 11/14/2021    2:57 PM 11/14/2021    9:46 AM 11/10/2021    2:01 PM  [place] in the past year? 0 0 0 0 0  Number falls in past yr: 0 0 0 0 0  Injury with Fall? 0 0 0 0 0  Risk for fall due to :  No Fall Risks No Fall Risks No Fall Risks No Fall Risks  Follow up Falls evaluation completed;Falls prevention discussed Falls evaluation completed Falls evaluation completed Falls evaluation completed Falls evaluation completed    FALL RISK PREVENTION PERTAINING TO THE HOME: Home free of loose throw rugs in walkways, pet beds, electrical cords, etc? Yes  Adequate lighting in your home to reduce risk  of falls? Yes   ASSISTIVE DEVICES UTILIZED TO PREVENT FALLS: Life alert? No  Use of a cane, walker or w/c? No   TIMED UP AND GO: Was the test performed? No .    Cognitive Function:    06/23/2016    1:38 PM  MMSE - Mini  Mental State Exam  Orientation to time 5  Orientation to Place 5  Registration 3  Attention/ Calculation 5  Recall 3  Language- name 2 objects 2  Language- repeat 1  Language- follow 3 step command 3  Language- read & follow direction 1  Write a sentence 1  Copy design 1  Total score 30        02/13/2022    3:43 PM 09/10/2020    4:17 PM 08/16/2019   11:58 AM 08/15/2018   12:00 PM  6CIT Screen  What Year? 0 points 4 points 0 points 0 points  What month? 0 points 0 points 0 points 0 points  What time? 0 points   0 points  Count back from 20 0 points   0 points  Months in reverse 0 points 0 points 0 points 0 points  Repeat phrase 0 points  6 points 0 points  Total Score 0 points   0 points    Immunizations Immunization History  Administered Date(s) Administered   Fluad Quad(high Dose 65+) 10/12/2018, 10/28/2020, 10/07/2021   Influenza Split 09/20/2013   Influenza Whole 10/23/2016, 11/13/2016   Influenza, High Dose Seasonal PF 12/20/2017   Influenza-Unspecified 10/20/2011, 09/17/2014, 10/10/2015, 10/11/2019   Moderna Sars-Covid-2 Vaccination 02/14/2019, 03/14/2019, 11/13/2019, 06/03/2020   Pneumococcal Conjugate-13 10/29/2016   Rsv, Bivalent, Protein Subunit Rsvpref,pf Evans Lance) 12/23/2021   Zoster Recombinat (Shingrix) 07/09/2017, 10/28/2017   Zoster, Live 10/19/2013, 05/16/2015   TDAP status: Due, Education has been provided regarding the importance of this vaccine. Advised may receive this vaccine at local pharmacy or Health Dept. Aware to provide a copy of the vaccination record if obtained from local pharmacy or Health Dept. Verbalized acceptance and understanding.  Screening Tests Health Maintenance  Topic Date Due   DTaP/Tdap/Td (1 - Tdap) Never done   COVID-19 Vaccine (5 - 2023-24 season) 02/26/2022 (Originally 09/19/2021)   Pneumonia Vaccine 16+ Years old (2 - PPSV23 or PCV20) 06/07/2022 (Originally 10/29/2017)   COLONOSCOPY (Pts 45-7yr Insurance coverage will  need to be confirmed)  10/08/2022 (Originally 02/25/2017)   MAMMOGRAM  12/25/2022   Medicare Annual Wellness (AWV)  02/14/2023   INFLUENZA VACCINE  Completed   DEXA SCAN  Completed   Hepatitis C Screening  Completed   Zoster Vaccines- Shingrix  Completed   HPV VACCINES  Aged Out   Health Maintenance Health Maintenance Due  Topic Date Due   DTaP/Tdap/Td (1 - Tdap) Never done    Lung Cancer Screening: (Low Dose CT Chest recommended if Age 75-80years, 30 pack-year currently smoking OR have quit w/in 15years.) does not qualify.   Hepatitis C Screening: Completed 02/2019.  Vision Screening: Recommended annual ophthalmology exams for early detection of glaucoma and other disorders of the eye.  Dental Screening: Recommended annual dental exams for proper oral hygiene  Community Resource Referral / Chronic Care Management: CRR required this visit?  No   CCM required this visit?  No      Plan:     I have personally reviewed and noted the following in the patient's chart:   Medical and social history Use of alcohol, tobacco or illicit drugs  Current medications and supplements including opioid prescriptions.  Patient is not currently taking opioid prescriptions. Functional ability and status Nutritional status Physical activity Advanced directives List of other physicians Hospitalizations, surgeries, and ER visits in previous 12 months Vitals Screenings to include cognitive, depression, and falls Referrals and appointments  In addition, I have reviewed and discussed with patient certain preventive protocols, quality metrics, and best practice recommendations. A written personalized care plan for preventive services as well as general preventive health recommendations were provided to patient.     Leta Jungling, LPN   6/75/9163

## 2022-02-14 ENCOUNTER — Encounter: Payer: Self-pay | Admitting: Internal Medicine

## 2022-02-14 NOTE — Assessment & Plan Note (Signed)
On pravastatin.  Low cholesterol diet and exercise.  Follow lipid panel and liver function tests.   

## 2022-02-14 NOTE — Assessment & Plan Note (Signed)
Colonoscopy 02/2016.

## 2022-02-14 NOTE — Assessment & Plan Note (Signed)
Taking potassium supplements.  Recheck potassium today.

## 2022-02-14 NOTE — Assessment & Plan Note (Signed)
Saw oncology 11/27/21 - recommended continuing taloxifen '10mg'$  q hs.  Also mammogram and Dexa 12/24/21.  Off tamoxifen. She was concerned this was contributing to her anxiety.  Continue f/u with oncology.

## 2022-02-14 NOTE — Assessment & Plan Note (Signed)
Continues on citalopram and lorazepam.  She is doing some better.    Has an appt with psychiatry scheduled for next month.  Discussed continuing her medication as she is doing for now, especially since doing some better.  Discussed hopefully will be able to get her down and eventually off lorazepam.

## 2022-02-14 NOTE — Assessment & Plan Note (Signed)
Breathing stable.

## 2022-02-14 NOTE — Assessment & Plan Note (Signed)
No upper symptoms reported.  Continue PPI.

## 2022-02-14 NOTE — Assessment & Plan Note (Signed)
Continue stiolto.  Breathing doing well.  Follow

## 2022-02-14 NOTE — Assessment & Plan Note (Signed)
Blood pressure as outlined.  On triam/hctz.  Follow pressures.  Follow metabolic panel.

## 2022-02-14 NOTE — Assessment & Plan Note (Signed)
Follow met b and A1c.  

## 2022-02-23 ENCOUNTER — Other Ambulatory Visit: Payer: Self-pay | Admitting: Internal Medicine

## 2022-02-23 ENCOUNTER — Telehealth: Payer: Self-pay | Admitting: Internal Medicine

## 2022-02-23 ENCOUNTER — Other Ambulatory Visit: Payer: Self-pay

## 2022-02-23 ENCOUNTER — Other Ambulatory Visit (INDEPENDENT_AMBULATORY_CARE_PROVIDER_SITE_OTHER): Payer: Medicare Other

## 2022-02-23 DIAGNOSIS — E876 Hypokalemia: Secondary | ICD-10-CM

## 2022-02-23 LAB — BASIC METABOLIC PANEL
BUN: 11 mg/dL (ref 6–23)
CO2: 32 mEq/L (ref 19–32)
Calcium: 9.6 mg/dL (ref 8.4–10.5)
Chloride: 100 mEq/L (ref 96–112)
Creatinine, Ser: 0.87 mg/dL (ref 0.40–1.20)
GFR: 65.33 mL/min (ref >60.00)
Glucose, Bld: 98 mg/dL (ref 70–99)
Potassium: 3.6 mEq/L (ref 3.5–5.1)
Sodium: 139 mEq/L (ref 135–145)

## 2022-02-23 MED ORDER — CITALOPRAM HYDROBROMIDE 40 MG PO TABS: 40.0000 mg | ORAL_TABLET | Freq: Every day | ORAL | 0 refills | Status: DC

## 2022-02-23 MED ORDER — BUSPIRONE HCL 5 MG PO TABS: 5.0000 mg | ORAL_TABLET | Freq: Every day | ORAL | 0 refills | Status: DC

## 2022-02-23 NOTE — Telephone Encounter (Signed)
Refilled: 01/27/2022 Last OV: 02/10/2022 Next OV: 05/13/2022

## 2022-02-23 NOTE — Telephone Encounter (Signed)
The patient stated she only has a half pill left. She will need to take another half in the morning.

## 2022-02-23 NOTE — Telephone Encounter (Signed)
Pt called in for the status of med LORazepam (ATIVAN) 0.5 MG tablet, as per pt, she only have half a pill for tonight left. note below was read to her. She's aware.

## 2022-02-23 NOTE — Telephone Encounter (Signed)
Refill request sent to Dr Nicki Reaper.

## 2022-02-23 NOTE — Telephone Encounter (Signed)
Buspar and celexa sent Lorazepam sent to Dr Nicki Reaper for approval

## 2022-02-23 NOTE — Telephone Encounter (Signed)
Prescription Request  02/23/2022  Is this a "Controlled Substance" medicine? No  LOV: 02/10/2022  What is the name of the medication or equipment? LORazepam (ATIVAN) 0.5 MG tablet, busPIRone (BUSPAR) 5 MG tablet, and citalopram (CELEXA) 40 MG tablet  Have you contacted your pharmacy to request a refill? No   Which pharmacy would you like this sent to?  [place], [place] - [address] [place] [address] [place] [address] Phone: [phone] Fax: [phone]    Patient notified that their request is being sent to the clinical staff for review and that they should receive a response within 2 business days.   Please advise at [facility] [phone]

## 2022-02-24 ENCOUNTER — Other Ambulatory Visit: Payer: Self-pay

## 2022-02-24 DIAGNOSIS — E876 Hypokalemia: Secondary | ICD-10-CM

## 2022-02-24 MED ORDER — LORAZEPAM 0.5 MG PO TABS: ORAL_TABLET | ORAL | 0 refills | Status: DC

## 2022-02-24 NOTE — Telephone Encounter (Signed)
I have refilled the lorazepam.  She should be scheduled to see psychiatry this month.  After there evaluation, they will determine further treatment.

## 2022-02-24 NOTE — Telephone Encounter (Signed)
Duplicate

## 2022-03-02 ENCOUNTER — Telehealth: Payer: Self-pay | Admitting: Internal Medicine

## 2022-03-02 ENCOUNTER — Ambulatory Visit: Payer: Medicare Other | Admitting: Psychiatry

## 2022-03-02 NOTE — Telephone Encounter (Signed)
See attached message. Please call pt to see what happened.  We have been waiting for this appt for a long time.

## 2022-03-02 NOTE — Telephone Encounter (Signed)
Pt called in staying that she was scheduled to see a psychologist either today or tomorrow over at [facility] but as per pt she don't have any info for that appt.

## 2022-03-02 NOTE — Telephone Encounter (Signed)
Pt no showed her psychiatry appt with Dr Shea Evans today.

## 2022-03-03 NOTE — Telephone Encounter (Signed)
Spoke with Jennifer [name]. She says she thought her appt was supposed to be today so when she rode over there yesterday to confirm she could find the office. they advised when she arrived that she had missed her appt. She was rescheduled for 05/01/22 and placed on cancellation list. Advised she needs to keep this appt. Also noted that she was out of buspirone. Advised that we refilled 2/5- she says she never picked up. She is going to call [address].

## 2022-03-03 NOTE — Telephone Encounter (Signed)
Noted  

## 2022-03-16 DIAGNOSIS — J449 Chronic obstructive pulmonary disease, unspecified: Secondary | ICD-10-CM | POA: Diagnosis not present

## 2022-03-23 ENCOUNTER — Encounter: Payer: Self-pay | Admitting: Adult Health

## 2022-03-23 ENCOUNTER — Other Ambulatory Visit: Payer: Self-pay

## 2022-03-23 ENCOUNTER — Inpatient Hospital Stay: Payer: Medicare Other

## 2022-03-23 ENCOUNTER — Inpatient Hospital Stay: Payer: Medicare Other | Attending: Adult Health | Admitting: Adult Health

## 2022-03-23 VITALS — BP 135/58 | HR 91 | Temp 97.8°F | Resp 18 | Ht 61.0 in | Wt 132.8 lb

## 2022-03-23 DIAGNOSIS — Z79811 Long term (current) use of aromatase inhibitors: Secondary | ICD-10-CM | POA: Insufficient documentation

## 2022-03-23 DIAGNOSIS — Z170 Estrogen receptor positive status [ER+]: Secondary | ICD-10-CM

## 2022-03-23 DIAGNOSIS — C50411 Malignant neoplasm of upper-outer quadrant of right female breast: Secondary | ICD-10-CM | POA: Diagnosis not present

## 2022-03-23 LAB — CMP (CANCER CENTER ONLY)
ALT: 13 U/L (ref 0–44)
AST: 21 U/L (ref 15–41)
Albumin: 4.3 g/dL (ref 3.5–5.0)
Alkaline Phosphatase: 55 U/L (ref 38–126)
Anion gap: 5 (ref 5–15)
BUN: 13 mg/dL (ref 8–23)
CO2: 36 mmol/L — ABNORMAL HIGH (ref 22–32)
Calcium: 9.9 mg/dL (ref 8.9–10.3)
Chloride: 98 mmol/L (ref 98–111)
Creatinine: 0.97 mg/dL (ref 0.44–1.00)
GFR, Estimated: >60 mL/min (ref >60)
Glucose, Bld: 99 mg/dL (ref 70–99)
Potassium: 4.2 mmol/L (ref 3.5–5.1)
Sodium: 139 mmol/L (ref 135–145)
Total Bilirubin: 0.5 mg/dL (ref 0.3–1.2)
Total Protein: 6.8 g/dL (ref 6.5–8.1)

## 2022-03-23 LAB — CBC WITH DIFFERENTIAL (CANCER CENTER ONLY)
Abs Immature Granulocytes: 0.02 10*3/uL (ref 0.00–0.07)
Basophils Absolute: 0.1 10*3/uL (ref 0.0–0.1)
Basophils Relative: 1 %
Eosinophils Absolute: 0.3 10*3/uL (ref 0.0–0.5)
Eosinophils Relative: 4 %
HCT: 40 % (ref 36.0–46.0)
Hemoglobin: 13.9 g/dL (ref 12.0–15.0)
Immature Granulocytes: 0 %
Lymphocytes Relative: 24 %
Lymphs Abs: 2.1 10*3/uL (ref 0.7–4.0)
MCH: 32.9 pg (ref 26.0–34.0)
MCHC: 34.8 g/dL (ref 30.0–36.0)
MCV: 94.6 fL (ref 80.0–100.0)
Monocytes Absolute: 0.7 10*3/uL (ref 0.1–1.0)
Monocytes Relative: 8 %
Neutro Abs: 5.5 10*3/uL (ref 1.7–7.7)
Neutrophils Relative %: 63 %
Platelet Count: 232 10*3/uL (ref 150–400)
RBC: 4.23 MIL/uL (ref 3.87–5.11)
RDW: 12.1 % (ref 11.5–15.5)
WBC Count: 8.7 10*3/uL (ref 4.0–10.5)
nRBC: 0 % (ref 0.0–0.2)

## 2022-03-23 MED ORDER — ANASTROZOLE 1 MG PO TABS: 1.0000 mg | ORAL_TABLET | Freq: Every day | ORAL | 11 refills | Status: DC

## 2022-03-23 NOTE — Progress Notes (Signed)
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[name], estrogen receptor positive ([place]) Staging form: Breast, AJCC 8th Edition - Clinical stage from 11/14/2020: Stage IA (cT1c, cN0, cM0, G2, ER+, PR+, HER2-) - Signed by Truitt Merle, MD on 12/17/2020 Stage prefix: Initial diagnosis Histologic grading system: 3 grade system - Pathologic stage from 12/27/2020: Stage IA (pT1c, pN0, cM0, G2, ER+, PR+, HER2-) - Signed by Truitt Merle, MD on 02/25/2021 Stage prefix: Initial diagnosis Histologic grading system: 3 grade system Residual tumor (R): R0 - None   SUMMARY OF ONCOLOGIC HISTORY: Oncology History Overview Note   Cancer Staging  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[name], estrogen receptor positive ([place]) Staging form: Breast, AJCC 8th Edition - Clinical stage from 11/14/2020: Stage IA (cT1c, cN0, cM0, G2, ER+, PR+, HER2-) - Signed by Truitt Merle, MD on 12/17/2020 Stage prefix: Initial diagnosis Histologic grading system: 3 grade system - Pathologic stage from 12/27/2020: Stage IA (pT1c, pN0, cM0, G2, ER+, PR+, HER2-) - Signed by Truitt Merle, MD on 02/25/2021 Stage prefix: Initial diagnosis Histologic grading system: 3 grade system Residual tumor (R): R0 - None     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 Ultrasound of the right axilla is negative.   11/14/2020 Cancer Staging   Staging form: Breast, AJCC 8th  Edition - Clinical stage from 11/14/2020: Stage IA (cT1c, cN0, cM0, G2, ER+, PR+, HER2-) - Signed by Truitt Merle, MD on 12/17/2020 Stage prefix: Initial diagnosis Histologic grading system: 3 grade system   11/14/2020 Pathology Results   DIAGNOSIS:  A. BREAST, RIGHT AT 11:00, 5 CM FROM THE NIPPLE; ULTRASOUND-GUIDED CORE NEEDLE BIOPSY:  - INVASIVE MAMMARY CARCINOMA, NO SPECIAL TYPE.  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[name], estrogen receptor positive ([place])   12/27/2020 Cancer Staging   Staging form: Breast, AJCC 8th Edition - Pathologic stage from 12/27/2020: Stage IA (pT1c, pN0, cM0, G2, ER+, PR+, HER2-) - Signed by Truitt Merle, MD on 02/25/2021 Stage prefix: Initial diagnosis Histologic grading system: 3 grade system Residual tumor (R): R0 - None   12/27/2020 Surgery   Right lumpectomy: IDC g2, 1.2cm, margins negative, 2 SLN negative   02/03/2021 - 02/28/2021 Radiation Therapy   Site Technique Total Dose (Gy) Dose per Fx (Gy) Completed Fx Beam Energies  Breast, Right: Breast_R 3D 40.05/40.05 2.67 15/15 6XFFF  Breast, Right: Breast_R_Bst 3D 10/10 2 5/5 6X     03/19/2021 -  Anti-estrogen oral therapy   20 mg Tamoxifen     CURRENT THERAPY: Tamoxifen  INTERVAL HISTORY: Jennifer [name] 77 y.o. [name] returns for follow-up of her breast cancer.  Jennifer [name] was unable to tolerate tamoxifen.  She tells me that she was too anxious and jittery on it and she felt  really sick.  She wants to know if there is a different medication that she can try.  Most recent mammogram occurred on the number 07/08/2021 demonstrating no mammographic evidence of malignancy and breast density category B.  On this day she also underwent bone density testing demonstrating a T-score of -2.1 in the right femur consistent with osteopenia.    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[name], estrogen receptor positive ([place]) 12/17/2020   Nasal congestion 02/04/2020   Sore throat 01/25/2020   Neck pain 08/19/2019   B12 deficiency 08/19/2019   Knee pain 02/04/2019   Memory change 02/04/2019   Radiculopathy of cervical spine 04/20/2017   Spondylosis of cervical spine 04/19/2017   Ear pain, left 03/27/2017   Right arm pain 11/12/2016   Vertigo 09/02/2016   Left low back pain 05/22/2015   Left hip pain 05/22/2015   SOB (shortness of breath) 11/11/2014   Right shoulder pain 07/16/2014   Health care maintenance 06/03/2014   Arm skin lesion, left 06/18/2013   URI (upper respiratory infection) 03/03/2013   Hyperglycemia 11/12/2012   Hypercholesterolemia 11/12/2012   Essential hypertension, benign 05/12/2012   GERD (gastroesophageal reflux disease) 05/12/2012   Gastritis 05/12/2012   History of colonic polyps 05/12/2012   Asthma 12/13/2011   Chronic restrictive lung disease 05/11/2011    is allergic to advair diskus [fluticasone-salmeterol] and codeine.  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 Gery Pray   Hypertension    PONV (postoperative nausea and vomiting)    Scoliosis    Ulcer     SURGICAL HISTORY: Past Surgical History:  Procedure Laterality Date   ABDOMINAL HYSTERECTOMY     ANTERIOR CERVICAL DECOMP/DISCECTOMY FUSION N/A 04/19/2017   Procedure: Anterior Cervical Decompression Fusion Cervical Three-Four, Cervical Four-Five, Cervical Five-Six;  Surgeon: Kary Kos, MD;  Location: [facility];  Service: Neurosurgery;  Laterality: N/A;  Anterior Cervical Decompression Fusion Cervical Three-Four, Cervical Four-Five, Cervical Five-Six   BREAST BIOPSY Right 11/14/2020   [place] Bx, Venus Clip, Invasive Mammary Carcinoma   BREAST LUMPECTOMY Right 2022   With radiation   BREAST LUMPECTOMY WITH RADIOACTIVE SEED AND SENTINEL LYMPH NODE BIOPSY Right 12/27/2020   Procedure: RIGHT BREAST LUMPECTOMY WITH RADIOACTIVE SEED AND AXILLARY SENTINEL LYMPH NODE BIOPSY;  Surgeon: Rolm Bookbinder, MD;  Location: [facility];  Service: General;  Laterality: Right;   [place]  REMOVAL OF MESENTERIC MASS     LUMBAR DISC SURGERY  1998   [facility]   PARTIAL HYSTERECTOMY  1981   prolapsed uterus   TUBAL LIGATION  1978    SOCIAL HISTORY: Social History   Socioeconomic History   Marital status: Married    Spouse name: Marchia Bond "[name]"   Number of children: 2   Years of education: Not on file   Highest education level: Not on file  Occupational History   Occupation: retired Pharmacist, hospital  Tobacco Use   Smoking status: Never   Smokeless tobacco: Never  Vaping Use   Vaping Use: Never used  Substance and Sexual Activity   Alcohol use: No    Alcohol/week: 0.0 standard drinks of alcohol   Drug use: No   Sexual activity: Not Currently  Other Topics Concern   Not on file  Social History Narrative   Right handed    Lives with husband   Had breast cancer  surgery right side   Worked in the school system    Social Determinants of Health   Financial Resource Strain: Low Risk  (02/13/2022)   Overall Financial Resource Strain (CARDIA)    Difficulty of Paying Living Expenses: Not hard at all  Food Insecurity: No Food Insecurity (02/13/2022)   Hunger Vital Sign    Worried About Running Out of Food in the Last Year: Never true    [place] in the Last Year: Never true  Transportation Needs: No Transportation Needs (02/13/2022)   PRAPARE - Hydrologist (Medical): No    Lack of Transportation (Non-Medical): No  Physical Activity: Unknown (02/13/2022)   Exercise Vital Sign    Days of Exercise per Week: 0 days    Minutes of Exercise per Session: Not on file  Stress: No Stress Concern Present (02/13/2022)   [place]    Feeling of Stress : Only a little  Social Connections: Unknown (02/13/2022)   Social Connection and Isolation Panel [NHANES]    Frequency of Communication with Friends and Family: More than three times a week    Frequency of Social Gatherings with Friends and Family: Not on file    Attends Religious Services: Not on file    Active Member of Clubs or Organizations: Not on file    Attends Archivist Meetings: Not on file    Marital Status: Married  Intimate Partner Violence: Not At Risk (02/13/2022)   Humiliation, Afraid, Rape, and Kick questionnaire    Fear of Current or Ex-Partner: No    Emotionally Abused: No    Physically Abused: No    Sexually Abused: No    FAMILY HISTORY: Family History  Problem Relation Age of Onset   Stroke Mother    Cancer Father        unknown type   Cancer Brother        "blood cancer"   CVA Maternal Grandmother    Healthy Son    Breast cancer Neg Hx    Colon cancer Neg Hx     Review of Systems  Constitutional:  Negative for appetite change, chills, fatigue, fever and unexpected  weight change.  HENT:   Negative for hearing loss, lump/mass and trouble swallowing.   Eyes:  Negative for eye problems and icterus.  Respiratory:  Negative for chest tightness, cough and shortness of breath.   Cardiovascular:  Negative for chest pain, leg swelling and palpitations.  Gastrointestinal:  Negative for abdominal distention, abdominal pain, constipation, diarrhea, nausea and vomiting.  Endocrine: Negative for hot flashes.  Genitourinary:  Negative for difficulty urinating.   Musculoskeletal:  Negative for arthralgias.  Skin:  Negative for itching and rash.  Neurological:  Negative for dizziness, extremity weakness, headaches and numbness.  Hematological:  Negative for adenopathy. Does not bruise/bleed easily.  Psychiatric/Behavioral:  Negative for depression. The patient is not nervous/anxious.       PHYSICAL EXAMINATION  ECOG PERFORMANCE STATUS: 0 - Asymptomatic  Vitals:   03/23/22 1407  BP: (!) 135/58  Pulse: 91  Resp: 18  Temp: 97.8 F (36.6 C)  SpO2: 97%    Physical Exam Constitutional:      General: She is not in acute distress.    Appearance: Normal appearance. She is not toxic-appearing.  HENT:     Head: Normocephalic and atraumatic.  Eyes:     General: No scleral icterus. Cardiovascular:     Rate and Rhythm: Normal rate and regular rhythm.     Pulses: Normal pulses.     Heart sounds: Normal heart sounds.  Pulmonary:     Effort: Pulmonary effort is normal.     Breath sounds: Normal breath sounds.  Chest:     Comments: Right breast status postlumpectomy and radiation no sign of local recurrence left breast is benign. Abdominal:     General: Abdomen is flat. Bowel sounds are normal. There is no distension.     Palpations: Abdomen is soft.     Tenderness: There is no abdominal tenderness.  Musculoskeletal:        General: No swelling.     Cervical back: Neck supple.  Lymphadenopathy:     Cervical: No cervical adenopathy.  Skin:    General: Skin  is warm and dry.     Findings: No rash.  Neurological:     General: No focal deficit present.     Mental Status: She is alert.  Psychiatric:        Mood and Affect: Mood normal.        Behavior: Behavior normal.     LABORATORY DATA:  CBC    Component Value Date/Time   WBC 8.7 03/23/2022 1341   WBC 7.9 10/07/2021 1340   RBC 4.23 03/23/2022 1341   HGB 13.9 03/23/2022 1341   HCT 40.0 03/23/2022 1341   PLT 232 03/23/2022 1341   MCV 94.6 03/23/2022 1341   MCH 32.9 03/23/2022 1341   MCHC 34.8 03/23/2022 1341   RDW 12.1 03/23/2022 1341   LYMPHSABS 2.1 03/23/2022 1341   MONOABS 0.7 03/23/2022 1341   EOSABS 0.3 03/23/2022 1341   BASOSABS 0.1 03/23/2022 1341    CMP     Component Value Date/Time   NA 139 03/23/2022 1341   K 4.2 03/23/2022 1341   CL 98 03/23/2022 1341   CO2 36 (H) 03/23/2022 1341   GLUCOSE 99 03/23/2022 1341   BUN 13 03/23/2022 1341   CREATININE 0.97 03/23/2022 1341   CALCIUM 9.9 03/23/2022 1341   PROT 6.8 03/23/2022 1341   ALBUMIN 4.3 03/23/2022 1341   AST 21 03/23/2022 1341   ALT 13 03/23/2022 1341   ALKPHOS 55 03/23/2022 1341   BILITOT 0.5 03/23/2022 1341   GFRNONAA >60 03/23/2022 1341   GFRAA >60 04/13/2017 1514     ASSESSMENT and THERAPY PLAN:   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  Katti has stopped taking the tamoxifen due to side effects and wants to know if there is a different medication she can try.  I reviewed with her the side effects in detail of aromatase inhibitors.  I recommended that she try taking anastrozole.  We reviewed her bone density results in detail and discussed bone health.  I gave her a handout on both the anastrozole and  bone health in her after visit summary today.  Shalane has no clinical or radiographic signs of breast cancer recurrence.  She will start taking anastrozole.  I recommended that she continue to follow-up with her primary care provider and we discussed healthy diet and exercise today.  She will repeat mammogram in December 2024 when it is due.  She will repeat bone density testing in December 2025.  We will see her back in 6 months for labs and follow-up.    All questions were answered. The patient knows to call the clinic with any problems, questions or concerns. We can certainly see the patient much sooner if necessary.  Total encounter time:20 minutes*in face-to-face visit time, chart review, lab review, care coordination, order entry, and documentation of the encounter time.    Wilber Bihari, NP 03/23/22 3:10 PM Medical Oncology and Hematology [facility] [place], [place] [address] Tel. [phone]    Fax. [phone]  *Total Encounter Time as defined by the Centers for Medicare and Medicaid Services includes, in addition to the face-to-face time of a patient visit (documented in the note above) non-face-to-face time: obtaining and reviewing outside history, ordering and reviewing medications, tests or procedures, care coordination (communications with other health care professionals or caregivers) and documentation in the medical record.

## 2022-03-23 NOTE — Patient Instructions (Signed)
Bone Health Bones protect organs, store calcium, anchor muscles, and support the whole body. Keeping your bones strong is important, especially as you get older. You can take actions to help keep your bones strong and healthy. Why is keeping my bones healthy important?  Keeping your bones healthy is important because your body constantly replaces bone cells. Cells get old, and new cells take their place. As we age, we lose bone cells because the body may not be able to make enough new cells to replace the old cells. The amount of bone cells and bone tissue you have is referred to as bone mass. The higher your bone mass, the stronger your bones. The aging process leads to an overall loss of bone mass in the body, which can increase the likelihood of: Broken bones. A condition in which the bones become weak and brittle (osteoporosis). A large decline in bone mass occurs in older adults. In women, it occurs about the time of menopause. What actions can I take to keep my bones healthy? Good health habits are important for maintaining healthy bones. This includes eating nutritious foods and exercising regularly. To have healthy bones, you need to get enough of the right minerals and vitamins. Most nutrition experts recommend getting these nutrients from the foods that you eat. In some cases, taking supplements may also be recommended. Doing certain types of exercise is also important for bone health. What are the nutritional recommendations for healthy bones?  Eating a well-balanced diet with plenty of calcium and vitamin D will help to protect your bones. Nutritional recommendations vary from person to person. Ask your health care provider what is healthy for you. Here are some general guidelines. Get enough calcium Calcium is the most important (essential) mineral for bone health. Most people can get enough calcium from their diet, but supplements may be recommended for people who are at risk for  osteoporosis. Good sources of calcium include: Dairy products, such as low-fat or nonfat milk, cheese, and yogurt. Dark green leafy vegetables, such as bok choy and broccoli. Foods that have calcium added to them (are fortified). Foods that may be fortified with calcium include orange juice, cereal, bread, soy beverages, and tofu products. Nuts, such as almonds. Follow these recommended amounts for daily calcium intake: Infants, 0-6 months: 200 mg. Infants, 6-12 months: 260 mg. Children, age 1-3: 700 mg. Children, age 4-8: 1,000 mg. Children, age 9-13: 1,300 mg. Teens, age 14-18: 1,300 mg. Adults, age 19-50: 1,000 mg. Adults, age 51-70: Men: 1,000 mg. Women: 1,200 mg. Adults, age 71 or older: 1,200 mg. Pregnant and breastfeeding females: Teens: 1,300 mg. Adults: 1,000 mg. Get enough vitamin D Vitamin D is the most essential vitamin for bone health. It helps the body absorb calcium. Sunlight stimulates the skin to make vitamin D, so be sure to get enough sunlight. If you live in a cold climate or you do not get outside often, your health care provider may recommend that you take vitamin D supplements. Good sources of vitamin D in your diet include: Egg yolks. Saltwater fish. Milk and cereal fortified with vitamin D. Follow these recommended amounts for daily vitamin D intake: Infants, 0-12 months: 400 international units (IU). Children and teens, age 1-18: 600 international units. Adults, age 59 or younger: 600 international units. Adults, age 60 or older: 600-1,000 international units. Get other important nutrients Other nutrients that are important for bone health include: Phosphorus. This mineral is found in meat, poultry, dairy foods, nuts, and legumes. The   recommended daily intake for adult men and adult women is 700 mg. Magnesium. This mineral is found in seeds, nuts, dark green vegetables, and legumes. The recommended daily intake for adult men is 400-420 mg. For adult women,  it is 310-320 mg. Vitamin K. This vitamin is found in green leafy vegetables. The recommended daily intake is 120 mcg for adult men and 90 mcg for adult women. What type of physical activity is best for building and maintaining healthy bones? Weight-bearing and strength-building activities are important for building and maintaining healthy bones. Weight-bearing activities cause muscles and bones to work against gravity. Strength-building activities increase the strength of the muscles that support bones. Weight-bearing and muscle-building activities include: Walking and hiking. Jogging and running. Dancing. Gym exercises. Lifting weights. Tennis and racquetball. Climbing stairs. Aerobics. Adults should get at least 30 minutes of moderate physical activity on most days. Children should get at least 60 minutes of moderate physical activity on most days. Ask your health care provider what type of exercise is best for you. How can I find out if my bone mass is low? Bone mass can be measured with an X-ray test called a bone mineral density (BMD) test. This test is recommended for all women who are age 16 or older. It may also be recommended for: Men who are age 51 or older. People who are at risk for osteoporosis because of: Having a long-term disease that weakens bones, such as kidney disease or rheumatoid arthritis. Having menopause earlier than normal. Taking medicine that weakens bones, such as steroids, thyroid hormones, or hormone treatment for breast cancer or prostate cancer. Smoking. Drinking three or more alcoholic drinks a day. Being underweight. Sedentary lifestyle. If you find that you have a low bone mass, you may be able to prevent osteoporosis or further bone loss by changing your diet and lifestyle. Where can I find more information? Bone Health & Osteoporosis Foundation: [employer] of Health: www.bones.[employer]Exposed.es International Osteoporosis  Foundation: Administrator.iofbonehealth.org Summary The aging process leads to an overall loss of bone mass in the body, which can increase the likelihood of broken bones and osteoporosis. Eating a well-balanced diet with plenty of calcium and vitamin D will help to protect your bones. Weight-bearing and strength-building activities are also important for building and maintaining strong bones. Bone mass can be measured with an X-ray test called a bone mineral density (BMD) test. This information is not intended to replace advice given to you by your health care provider. Make sure you discuss any questions you have with your health care provider. Document Revised: 06/19/2020 Document Reviewed: 06/19/2020 Elsevier Patient Education  [place]. Anastrozole Tablets What is this medication? ANASTROZOLE (an AS troe zole) treats breast cancer. It works by decreasing the amount of estrogen hormone your body makes, which slows or stops breast cancer cells from spreading or growing. This medicine may be used for other purposes; ask your health care provider or pharmacist if you have questions. COMMON BRAND NAME(S): Arimidex What should I tell my care team before I take this medication? They need to know if you have any of these conditions: Bone problems Heart disease High cholesterol An unusual or allergic reaction to anastrozole, other medications, foods, dyes, or preservatives Pregnant or trying to get pregnant Breast-feeding How should I use this medication? Take this medication by mouth with a glass of water. Follow the directions on the prescription label. You can take it with or without food. If it upsets your stomach, take it  with food. Take your medication at regular intervals. Do not take it more often than directed. Do not stop taking except on your care team's advice. Talk to your care team about the use of this medication in children. Special care may be needed. Overdosage: If you think  you have taken too much of this medicine contact a poison control center or emergency room at once. NOTE: This medicine is only for you. Do not share this medicine with others. What if I miss a dose? If you miss a dose, take it as soon as you can. If it is almost time for your next dose, take only that dose. Do not take double or extra doses. What may interact with this medication? Estrogen and progestin hormones Tamoxifen This list may not describe all possible interactions. Give your health care provider a list of all the medicines, herbs, non-prescription drugs, or dietary supplements you use. Also tell them if you smoke, drink alcohol, or use illegal drugs. Some items may interact with your medicine. What should I watch for while using this medication? Visit your care team for regular checks on your progress. Let your care team know about any unusual vaginal bleeding. Using this medication for a long time may weaken your bones. The risk of bone fractures may be increased. Talk to your care team about your bone health. You should make sure that you get enough calcium and vitamin D while you are taking this medication. Discuss the foods you eat and the vitamins you take with your care team. Talk to your care team if you may be pregnant. Serious birth defects can occur if you take this medication during pregnancy and for 3 weeks after the last dose. You will need a negative pregnancy test before starting this medication. Estrogen and progestin hormones may not work as well while you are taking this medication. Contraception is recommended while taking this medication and for 3 weeks after the last dose. Your care team can help you find the option that works for you. Do not breastfeed while taking this medication and for 2 weeks after the last dose. This medication may cause infertility. Talk with your care team if you are concerned about your fertility. What side effects may I notice from receiving  this medication? Side effects that you should report to your care team as soon as possible: Allergic reactions--skin rash, itching, hives, swelling of the face, lips, tongue, or throat Side effects that usually do not require medical attention (report to your care team if they continue or are bothersome): Bone, joint, or muscle pain Headache Hot flashes Nausea Sore throat Swelling of the ankles, hands, or feet Unusual weakness or fatigue This list may not describe all possible side effects. Call your doctor for medical advice about side effects. You may report side effects to FDA at 1-800-FDA-1088. Where should I keep my medication? Keep out of the reach of children and pets. Store at room temperature between 20 and 25 degrees C (68 and 77 degrees F). Get rid of any unused medication after the expiration date. To get rid of medications that are no longer wanted or have expired: Take the medication to a medication take-back program. Check with your pharmacy or law enforcement to find a location. If you cannot return the medication, check the label or package insert to see if the medication should be thrown out in the garbage or flushed down the toilet. If you are not sure, ask your care team. If it  is safe to put it in the trash, empty the medication out of the container. Mix the medication with cat litter, dirt, coffee grounds, or other unwanted substance. Seal the mixture in a bag or container. Put it in the trash. NOTE: This sheet is a summary. It may not cover all possible information. If you have questions about this medicine, talk to your doctor, pharmacist, or health care provider.  2023 Elsevier/Gold Standard (2021-05-21 00:00:00)

## 2022-03-23 NOTE — Assessment & Plan Note (Signed)
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  Kainat has stopped taking the tamoxifen due to side effects and wants to know if there is a different medication she can try.  I reviewed with her the side effects in detail of aromatase inhibitors.  I recommended that she try taking anastrozole.  We reviewed her bone density results in detail and discussed bone health.  I gave her a handout on both the anastrozole and bone health in her after visit summary today.  Jennifer [name] has no clinical or radiographic signs of breast cancer recurrence.  She will start taking anastrozole.  I recommended that she continue to follow-up with her primary care provider and we discussed healthy diet and exercise today.  She will repeat mammogram in December 2024 when it is due.  She will repeat bone density testing in December 2025.  We will see her back in 6 months for labs and follow-up.

## 2022-03-24 ENCOUNTER — Other Ambulatory Visit (INDEPENDENT_AMBULATORY_CARE_PROVIDER_SITE_OTHER): Payer: Medicare Other

## 2022-03-24 DIAGNOSIS — E876 Hypokalemia: Secondary | ICD-10-CM

## 2022-03-24 LAB — POTASSIUM: Potassium: 3.4 mEq/L — ABNORMAL LOW (ref 3.5–5.1)

## 2022-03-25 ENCOUNTER — Telehealth: Payer: Self-pay | Admitting: Adult Health

## 2022-03-25 NOTE — Telephone Encounter (Signed)
Scheduled appointment per los. Patient is aware of the made appointments.

## 2022-03-26 ENCOUNTER — Telehealth: Payer: Self-pay | Admitting: Internal Medicine

## 2022-03-26 ENCOUNTER — Other Ambulatory Visit: Payer: Self-pay | Admitting: Internal Medicine

## 2022-03-26 NOTE — Telephone Encounter (Signed)
Rx ok'd for lorazepam #90 with no refills - until can get established with psychiatry.

## 2022-03-26 NOTE — Telephone Encounter (Signed)
Prescription Request  03/26/2022  LOV: 02/10/2022  What is the name of the medication or equipment? LORazepam (ATIVAN) 0.5 MG tablet. Patient is out of medication  Have you contacted your pharmacy to request a refill? Yes   Which pharmacy would you like this sent to?  [place], [place] - [address] [place] [address] Phone: [phone] Fax: [phone]    Patient notified that their request is being sent to the clinical staff for review and that they should receive a response within 2 business days.   Please advise at [facility] [phone]

## 2022-03-27 ENCOUNTER — Other Ambulatory Visit: Payer: Self-pay

## 2022-03-27 DIAGNOSIS — E876 Hypokalemia: Secondary | ICD-10-CM

## 2022-03-27 NOTE — Telephone Encounter (Signed)
Ativan refilled yesterday from Dr Nicki Reaper

## 2022-03-30 ENCOUNTER — Other Ambulatory Visit: Payer: Self-pay

## 2022-03-30 ENCOUNTER — Telehealth: Payer: Self-pay | Admitting: Internal Medicine

## 2022-03-30 MED ORDER — BUSPIRONE HCL 5 MG PO TABS: 5.0000 mg | ORAL_TABLET | Freq: Every day | ORAL | 5 refills | Status: DC

## 2022-03-30 NOTE — Telephone Encounter (Signed)
Prescription Request Patient will be out of medication by Thursday.  03/30/2022  LOV: 02/10/2022  What is the name of the medication or equipment? busPIRone (BUSPAR) 5 MG tablet  Have you contacted your pharmacy to request a refill? No   Which pharmacy would you like this sent to?  [place], [place] - [address] [place] [address] Phone: [phone] Fax: [phone]    Patient notified that their request is being sent to the clinical staff for review and that they should receive a response within 2 business days.   Please advise at [facility] [phone]

## 2022-03-30 NOTE — Telephone Encounter (Signed)
sent 

## 2022-04-07 ENCOUNTER — Other Ambulatory Visit: Payer: Self-pay | Admitting: Internal Medicine

## 2022-04-08 ENCOUNTER — Ambulatory Visit (INDEPENDENT_AMBULATORY_CARE_PROVIDER_SITE_OTHER): Payer: Medicare Other | Admitting: Psychiatry

## 2022-04-08 ENCOUNTER — Encounter: Payer: Self-pay | Admitting: Psychiatry

## 2022-04-08 VITALS — BP 123/74 | HR 89 | Temp 97.9°F | Ht 61.0 in | Wt 134.0 lb

## 2022-04-08 DIAGNOSIS — F411 Generalized anxiety disorder: Secondary | ICD-10-CM | POA: Diagnosis not present

## 2022-04-08 DIAGNOSIS — F32A Depression, unspecified: Secondary | ICD-10-CM | POA: Diagnosis not present

## 2022-04-08 MED ORDER — TRAZODONE HCL 50 MG PO TABS: 25.0000 mg | ORAL_TABLET | Freq: Every evening | ORAL | 1 refills | Status: DC | PRN

## 2022-04-08 MED ORDER — LORAZEPAM 0.5 MG PO TABS: 0.7500 mg | ORAL_TABLET | Freq: Every day | ORAL | 0 refills | Status: DC

## 2022-04-08 NOTE — Patient Instructions (Addendum)
Please start taking Lorazepam 1 1/2 tablet only in the morning . Please stop bedtime dose. Please monitor for any withdrawal symptoms and get immediate help if you have withdrawal symptoms.   [email or web address]  www.[email or web address]  [email or web address] [address], [place], [place] [address]  [phone]  [facility], [email or web address] [address], [place], [place] [address]  [phone]   [facility] - [id number]  [facility] - [id number]  [facility] [place] [id number]  [facility] [phone]        Trazodone Tablets What is this medication? TRAZODONE (TRAZ oh done) treats depression. It increases the amount of serotonin in the brain, a hormone that helps regulate mood. This medicine may be used for other purposes; ask your health care provider or pharmacist if you have questions. COMMON BRAND NAME(S): Desyrel What should I tell my care team before I take this medication? They need to know if you have any of these conditions: Attempted suicide or thinking about it Bipolar disorder Bleeding problems Glaucoma Heart disease, or previous heart attack Irregular heart beat Kidney or liver disease Low levels of sodium in the blood An unusual or allergic reaction to trazodone, other medications, foods, dyes or preservatives Pregnant or trying to get pregnant Breast-feeding How should I use this medication? Take this medication by mouth with a glass of water. Follow the directions on the prescription label. Take this medication shortly after a meal or a light snack. Take your medication at regular intervals. Do not take your medication more often than directed. Do not stop taking this medication suddenly except upon the advice of your care team. Stopping this medication too quickly may cause serious side effects or your  condition may worsen. A special MedGuide will be given to you by the pharmacist with each prescription and refill. Be sure to read this information carefully each time. Talk to your care team regarding the use of this medication in children. Special care may be needed. Overdosage: If you think you have taken too much of this medicine contact a poison control center or emergency room at once. NOTE: This medicine is only for you. Do not share this medicine with others. What if I miss a dose? If you miss a dose, take it as soon as you can. If it is almost time for your next dose, take only that dose. Do not take double or extra doses. What may interact with this medication? Do not take this medication with any of the following: Certain medications for fungal infections like fluconazole, itraconazole, ketoconazole, posaconazole, voriconazole Cisapride Dronedarone Linezolid MAOIs like Carbex, Eldepryl, Marplan, Nardil, and Parnate Mesoridazine Methylene blue (injected into a vein) Pimozide Saquinavir Thioridazine This medication may also interact with the following: Alcohol Antiviral medications for HIV or AIDS Aspirin and aspirin-like medications Barbiturates like phenobarbital Certain medications for blood pressure, heart disease, irregular heart beat Certain medications for depression, anxiety, or psychotic disturbances Certain medications for migraine headache like almotriptan, eletriptan, frovatriptan, naratriptan, rizatriptan, sumatriptan, zolmitriptan Certain medications for seizures like carbamazepine and phenytoin Certain medications for sleep Certain medications that treat or prevent blood clots like dalteparin, enoxaparin, warfarin Digoxin Fentanyl Lithium NSAIDS, medications for pain and inflammation, like ibuprofen or naproxen Other medications that prolong the QT interval (cause an abnormal heart rhythm) like dofetilide Rasagiline Supplements like St. John's wort, kava  kava, valerian Tramadol Tryptophan This list may not  describe all possible interactions. Give your health care provider a list of all the medicines, herbs, non-prescription drugs, or dietary supplements you use. Also tell them if you smoke, drink alcohol, or use illegal drugs. Some items may interact with your medicine. What should I watch for while using this medication? Tell your care team if your symptoms do not get better or if they get worse. Visit your care team for regular checks on your progress. Because it may take several weeks to see the full effects of this medication, it is important to continue your treatment as prescribed by your care team. Watch for new or worsening thoughts of suicide or depression. This includes sudden changes in mood, behaviors, or thoughts. These changes can happen at any time but are more common in the beginning of treatment or after a change in dose. Call your care team right away if you experience these thoughts or worsening depression. Manic episodes may happen in patients with bipolar disorder who take this medication. Watch for changes in feelings or behaviors such as feeling anxious, nervous, agitated, panicky, irritable, hostile, aggressive, impulsive, severely restless, overly excited and hyperactive, or trouble sleeping. These changes can happen at any time but are more common in the beginning of treatment or after a change in dose. Call your care team right away if you notice any of these symptoms. You may get drowsy or dizzy. Do not drive, use machinery, or do anything that needs mental alertness until you know how this medication affects you. Do not stand or sit up quickly, especially if you are an older patient. This reduces the risk of dizzy or fainting spells. Alcohol may interfere with the effect of this medication. Avoid alcoholic drinks. This medication may cause dry eyes and blurred vision. If you wear contact lenses you may feel some discomfort.  Lubricating drops may help. See your eye doctor if the problem does not go away or is severe. Your mouth may get dry. Chewing sugarless gum, sucking hard candy and drinking plenty of water may help. Contact your care team if the problem does not go away or is severe. What side effects may I notice from receiving this medication? Side effects that you should report to your care team as soon as possible: Allergic reactions--skin rash, itching, hives, swelling of the face, lips, tongue, or throat Bleeding--bloody or black, tar-like stools, red or dark brown urine, vomiting blood or brown material that looks like coffee grounds, small, red or purple spots on skin, unusual bleeding or bruising Heart rhythm changes--fast or irregular heartbeat, dizziness, feeling faint or lightheaded, chest pain, trouble breathing Low blood pressure--dizziness, feeling faint or lightheaded, blurry vision Low sodium level--muscle weakness, fatigue, dizziness, headache, confusion Prolonged or painful erection Serotonin syndrome--irritability, confusion, fast or irregular heartbeat, muscle stiffness, twitching muscles, sweating, high fever, seizures, chills, vomiting, diarrhea Sudden eye pain or change in vision such as blurry vision, seeing halos around lights, vision loss Thoughts of suicide or self-harm, worsening mood, feelings of depression Side effects that usually do not require medical attention (report to your care team if they continue or are bothersome): Change in sex drive or performance Constipation Dizziness Drowsiness Dry mouth This list may not describe all possible side effects. Call your doctor for medical advice about side effects. You may report side effects to FDA at 1-800-FDA-1088. Where should I keep my medication? Keep out of the reach of children and pets. Store at room temperature between 15 and 30 degrees C (59 to 86  degrees F). Protect from light. Keep container tightly closed. Throw away any  unused medication after the expiration date. NOTE: This sheet is a summary. It may not cover all possible information. If you have questions about this medicine, talk to your doctor, pharmacist, or health care provider.  2023 Elsevier/Gold Standard (2007-02-26 00:00:00)

## 2022-04-08 NOTE — Progress Notes (Unsigned)
Psychiatric Initial Adult Assessment   Patient Identification: Jennifer [name] MRN:  [id number] Date of Evaluation:  04/08/2022 Referral Source: [name], MD Chief Complaint:   Chief Complaint  Patient presents with   Establish Care   Anxiety   Visit Diagnosis:    ICD-10-CM   1. GAD (generalized anxiety disorder)  F41.1 LORazepam (ATIVAN) 0.5 MG tablet    DISCONTINUED: LORazepam (ATIVAN) 0.5 MG tablet    2. 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  Patient reports her depression and anxiety symptoms started with her diagnosis of breast cancer, this may have been in November 2022.  She is currently status post lumpectomy followed by adjuvant radiation and antiestrogen therapy with tamoxifen initially currently on anastrozole.  Patient reports history of sadness, feeling down, hopeless in the past.  She however feels much better and does not feel as depressed.  Patient continues to have anxiety symptoms, started around the time she was diagnosed with cancer.  Describes her symptoms as feeling nervous, anxious, on edge, worrying about things especially her health, trouble relaxing.  She also reports that she developed tremors of her bilateral upper extremities as well as had internal nervousness and shakiness especially when she went into a group like a church, social situations.  She reports her primary provider referred her to neurology.  Reviewed notes per neurology dated 06/26/2021-Dr. Wells Guiles Tat -'tremors likely due to tamoxifen.  Ruled out Parkinson's  disease.'  Patient reports she feels much better with regards to her tremors.  She however continues to have situational anxiety in group situations like going to church, as well as driving her car.  Patient reports she is currently on Celexa 40 mg initiated by her primary care provider, BuSpar 5 mg daily recently added.  She is also on lorazepam which she takes twice a day.  Patient reports this current combination of medication is beneficial.  Patient denies any history of trauma.  Patient denies any manic or hypomanic symptoms.  Patient denies any suicidality, homicidality or perceptual disturbances.  Patient appeared to be alert, oriented to person place time situation.  Remote memory immediate 3 out of 3, after 5 minutes 0 out of 3.  Patient was able to do serial threes well, attention and focus seem to be good.  Patient denies any other concerns today.   Associated Signs/Symptoms: Depression Symptoms:  depressed mood, decreased appetite, (Hypo) Manic Symptoms:   Denies Anxiety Symptoms:  Excessive Worry, Psychotic Symptoms:   Denies PTSD Symptoms: Negative  Past Psychiatric History: Patient has never been under the care of a psychiatrist or therapist in the past.  Patient was under the care of primary care provider who was managing her medications-Dr. [name].  Denies inpatient behavioral health admissions.  Denies suicide attempts.  Previous Psychotropic Medications: Yes BuSpar, Celexa, lorazepam  Substance Abuse History in the last 12 months:  No.  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 Gery Pray   Hypertension    PONV (postoperative nausea and vomiting)    Scoliosis    Ulcer     Past Surgical History:  Procedure Laterality Date   ABDOMINAL HYSTERECTOMY     ANTERIOR CERVICAL DECOMP/DISCECTOMY FUSION N/A 04/19/2017   Procedure: Anterior Cervical Decompression Fusion Cervical Three-Four, Cervical Four-Five, Cervical Five-Six;  Surgeon: Kary Kos, MD;  Location: [facility];  Service: Neurosurgery;  Laterality: N/A;  Anterior Cervical Decompression Fusion Cervical Three-Four, Cervical Four-Five, Cervical Five-Six   BREAST BIOPSY Right 11/14/2020   [place] Bx, [place] Clip, Invasive Mammary Carcinoma   BREAST LUMPECTOMY Right 2022   With radiation   BREAST LUMPECTOMY WITH RADIOACTIVE SEED AND SENTINEL LYMPH NODE BIOPSY Right 12/27/2020   Procedure: RIGHT BREAST LUMPECTOMY WITH RADIOACTIVE SEED AND AXILLARY SENTINEL LYMPH NODE BIOPSY;  Surgeon: Rolm Bookbinder, MD;  Location: [facility];  Service: General;  Laterality: Right;   [place]   LAPAROSCOPIC REMOVAL OF MESENTERIC MASS     LUMBAR [place]   PARTIAL HYSTERECTOMY  1981   prolapsed uterus   TUBAL LIGATION  1978    Family Psychiatric History: As noted below.  Family History:  Family History  Problem Relation Age of Onset   Stroke Mother    Cancer Father        unknown type   Cancer Brother        "blood cancer"   CVA Maternal Grandmother    Healthy Son    Breast cancer Neg Hx    Colon cancer Neg Hx    Mental illness Neg Hx     Social History:   Social History   Socioeconomic History   Marital status: Married    Spouse name: Marchia Bond "[name]"   Number of children: 2   Years of education: Not on file   Highest education level: 12th grade  Occupational History   Occupation: retired Pharmacist, hospital  Tobacco Use   Smoking status: Never   Smokeless tobacco: Never  Vaping Use   Vaping Use: Never used  Substance and Sexual Activity   Alcohol  use: No    Alcohol/week: 0.0 standard drinks of alcohol   Drug use: No   Sexual activity: Not Currently    Comment: not asked if sexually active  Other Topics Concern   Not on file  Social History Narrative   Right handed    Lives with husband   Had breast cancer surgery right side   Worked in the school system    Social Determinants of Health   Financial Resource Strain: Low Risk  (02/13/2022)   Overall Financial Resource Strain (CARDIA)    Difficulty of Paying Living Expenses: Not hard at all  Food Insecurity: No Food Insecurity (02/13/2022)   Hunger Vital Sign    Worried About Running Out of Food in the Last Year: Never true    [place] in the Last Year: Never true  Transportation Needs: No Transportation Needs (02/13/2022)   PRAPARE - Hydrologist (Medical): No    Lack of Transportation (Non-Medical): No  Physical Activity: Unknown (02/13/2022)  Exercise Vital Sign    Days of Exercise per Week: 0 days    Minutes of Exercise per Session: Not on file  Stress: No Stress Concern Present (02/13/2022)   [place]    Feeling of Stress : Only a little  Social Connections: Unknown (02/13/2022)   Social Connection and Isolation Panel [NHANES]    Frequency of Communication with Friends and Family: More than three times a week    Frequency of Social Gatherings with Friends and Family: Not on file    Attends Religious Services: Not on file    Active Member of Clubs or Organizations: Not on file    Attends Archivist Meetings: Not on file    Marital Status: Married    Additional Social History: Patient was born and raised in [place].  Patient reports she was raised by her mother and stepfather.  Her biological father was not involved.  She has 1 brother.  They have a good relationship.  Patient graduated high school.  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  Patient has been married, together with her husband since the past more than 31 years.  Patient has a son and a daughter.  She has 3 grandchildren.  Patient currently lives in [place].  Patient denies legal issues.  Patient is religious.  Allergies:   Allergies  Allergen Reactions   Advair Diskus [Fluticasone-Salmeterol] Other (See Comments)    Causes blisters in her mouth   Codeine     Unknown reaction   Pollen Extract Cough    Metabolic Disorder Labs: Lab Results  Component Value Date   HGBA1C 5.5 02/10/2022   No results found for: "PROLACTIN" Lab Results  Component Value Date   CHOL 172 02/10/2022   TRIG 91.0 02/10/2022   HDL 67.90 02/10/2022   CHOLHDL 3 02/10/2022   VLDL 18.2 02/10/2022   LDLCALC 86 02/10/2022   LDLCALC 66 06/04/2021   Lab Results  Component Value Date   TSH 0.93 12/16/2021    Therapeutic Level Labs: No results found for: "LITHIUM" No results found for: "CBMZ" No results found for: "VALPROATE"  Current Medications: Current Outpatient Medications  Medication Sig Dispense Refill   anastrozole (ARIMIDEX) 1 MG tablet Take 1 tablet (1 mg total) by mouth daily. 30 tablet 11   busPIRone (BUSPAR) 5 MG tablet Take 1 tablet (5 mg total) by mouth daily. 30 tablet 5   citalopram (CELEXA) 40 MG tablet Take 1 tablet (40 mg total) by mouth daily. 90 tablet 0   potassium chloride (KLOR-CON M) 10 MEQ tablet Take one tablet by mouth once daily. 90 tablet 1   pravastatin (PRAVACHOL) 20 MG tablet Take 1 tablet (20 mg total) by mouth daily. 90 tablet 1   Tiotropium Bromide-Olodaterol (STIOLTO RESPIMAT) 2.5-2.5 MCG/ACT AERS Inhale 1 puff into the lungs daily.     traZODone (DESYREL) 50 MG tablet Take 0.5-1 tablets (25-50 mg total) by mouth at bedtime as needed for sleep. 30 tablet 1   triamterene-hydrochlorothiazide (MAXZIDE-25) 37.5-25 MG tablet Take 1 tablet by mouth daily. 30 tablet 5   Cyanocobalamin (B-12) 5000 MCG CAPS Take 5,000 Units by mouth daily.  Metabolism support (Patient not taking: Reported on 04/08/2022)     LORazepam (ATIVAN) 0.5 MG tablet Take 1.5 tablets (0.75 mg total) by mouth daily. 90 tablet 0   No current facility-administered medications for this visit.    Musculoskeletal: Strength & Muscle Tone:  within normal limits Gait & Station: normal Patient leans: N/A  Psychiatric Specialty Exam: Review of Systems  Neurological:  Positive for tremors (improved).  Psychiatric/Behavioral:  The patient is nervous/anxious.   All other systems reviewed and are negative.   Blood pressure 123/74, pulse 89, temperature 97.9 F (36.6 C), temperature source Skin, height 5\' 1"  (1.549 m), weight 134 lb (60.8 kg).Body mass index is 25.32 kg/m.  General Appearance: Casual  Eye Contact:  Fair  Speech:  Clear and Coherent  Volume:  Normal  Mood:  Anxious  Affect:  Congruent  Thought Process:  Goal Directed and Descriptions of Associations: Intact  Orientation:  Full (Time, Place, and Person)  Thought Content:  Logical  Suicidal Thoughts:  No  Homicidal Thoughts:  No  Memory:  Immediate;   Fair Recent;   limited Remote;   limited  Judgement:  Fair  Insight:  Fair  Psychomotor Activity:  Normal  Concentration:  Concentration: Poor and Attention Span: Fair  Recall:  [employer] of Knowledge:Fair  Language: Fair  Akathisia:  No  Handed:  Right  AIMS (if indicated):  not done  Assets:  Communication Skills Desire for [place] Talents/Skills  ADL's:  Intact  Cognition: WNL  Sleep:  Fair   Screenings: GAD-7    Personnel officer Visit from 04/08/2022 in [facility]  Total GAD-7 Score 2      Mini-Mental    [place] from 06/23/2016 in [facility] at [facility]  Total Score (max 30 points ) 30      PHQ2-9    [place] Visit from 04/08/2022 in [facility] from 02/13/2022 in [facility] at [facility] Visit from 12/16/2021 in [facility] at [facility] Video Visit from 11/10/2021 in [facility] at [facility] Visit from 10/29/2021 in [facility] at [facility]  PHQ-2 Total Score 1 0 0 0 1  PHQ-9 Total Score 3 -- -- -- --      [place] Office Visit from 04/08/2022 in [facility] (Discharged) from 12/27/2020 in [facility] from 12/19/2020 in [facility] PREADMISSION TESTING  C-SSRS RISK CATEGORY No Risk No Risk Error: Question 6 not populated       Assessment and Plan: Jennifer [name] is a 77 year old Caucasian female, married, retired, lives in [place], recent anxiety, depression, breast cancer currently in remission status post surgery, radiation, was evaluated in office today.  Patient although improving continues to need control of her anxiety, will benefit from medication management, psychotherapy sessions.  Plan as noted below. The patient demonstrates the following risk factors for suicide: Chronic risk factors for suicide include: psychiatric disorder of anxiety, depression . Acute risk factors for suicide include:  Recent cancer diagnosis although currently in remission . Protective factors for this patient include: positive social support, positive therapeutic relationship, coping skills, hope for the future, and religious beliefs against suicide. Considering these factors, the overall suicide risk at this point appears to be low. Patient is appropriate for outpatient follow up.  Plan Generalized anxiety disorder-improving Continue Celexa 40 mg p.o. daily Continue BuSpar 5 mg p.o. daily Will reduce lorazepam to 0.75 mg p.o. daily only in the morning.  Long-term plan is to  taper it off. Reviewed [place] PMP AWARxE  Provided education about long-term risk of being on benzodiazepine therapy. Will refer for CBT-I have communicated with our therapist Ms. Katie Bounds.  Patient also provided resources in the community.  Depression unspecified-improving Continue Celexa 40 mg p.o. daily Start trazodone 25-50 mg p.o. nightly as needed for sleep  I have reviewed labs including 03/23/2022-sodium level-within normal limits, TSH-12/08/2021-within normal limits.  I have also reviewed notes per Dr. Burr Medico dated 11/27/2021-patient with malignant neoplasm of upper outer quadrant of the right breast, status post lumpectomy.  Collaboration of Care: I have reviewed notes per Dr. Filbert Schilder 02/10/2022-patient with anxiety-continued on Celexa and lorazepam.  Patient/Guardian was advised Release of Information must be obtained prior to any record release in order to collaborate their care with an outside provider. Patient/Guardian was advised if they have not already done so to contact the registration department to sign all necessary forms in order for [place] to release information regarding their care.   Consent: Patient/Guardian gives verbal consent for treatment and assignment of benefits for services provided during this visit. Patient/Guardian expressed understanding and agreed to proceed.    Follow-up in clinic in 4 to 6 weeks or sooner if needed.  This note was generated in part or whole with voice recognition software. Voice recognition is usually quite accurate but there are transcription errors that can and very often do occur. I apologize for any typographical errors that were not detected and corrected.     Ursula Alert, MD 3/21/202412:59 PM

## 2022-04-13 NOTE — Telephone Encounter (Signed)
Error

## 2022-04-14 DIAGNOSIS — J449 Chronic obstructive pulmonary disease, unspecified: Secondary | ICD-10-CM | POA: Diagnosis not present

## 2022-04-15 ENCOUNTER — Other Ambulatory Visit (INDEPENDENT_AMBULATORY_CARE_PROVIDER_SITE_OTHER): Payer: Medicare Other

## 2022-04-15 DIAGNOSIS — E876 Hypokalemia: Secondary | ICD-10-CM

## 2022-04-15 LAB — POTASSIUM: Potassium: 3.5 mEq/L (ref 3.5–5.1)

## 2022-05-01 ENCOUNTER — Ambulatory Visit: Payer: Medicare Other | Admitting: Psychiatry

## 2022-05-04 ENCOUNTER — Other Ambulatory Visit: Payer: Self-pay | Admitting: Internal Medicine

## 2022-05-04 DIAGNOSIS — F411 Generalized anxiety disorder: Secondary | ICD-10-CM

## 2022-05-04 NOTE — Telephone Encounter (Signed)
refilled: 04/08/2022 Last OV: 02/10/2022 Next OV: 05/13/2022

## 2022-05-05 NOTE — Telephone Encounter (Signed)
Seeing psychiatry now.  Needs to get refill from Dr Elna Breslow.  They are in the process of trying to taper her off.  Let me know if a problem.

## 2022-05-06 NOTE — Telephone Encounter (Signed)
Please refuse. Dr Elna Breslow prescribed 04/08/22

## 2022-05-13 ENCOUNTER — Encounter: Payer: Self-pay | Admitting: Internal Medicine

## 2022-05-13 ENCOUNTER — Ambulatory Visit (INDEPENDENT_AMBULATORY_CARE_PROVIDER_SITE_OTHER): Payer: Medicare Other | Admitting: Internal Medicine

## 2022-05-13 VITALS — BP 106/68 | HR 82 | Temp 98.1°F | Resp 16 | Ht 61.0 in | Wt 127.0 lb

## 2022-05-13 DIAGNOSIS — Z170 Estrogen receptor positive status [ER+]: Secondary | ICD-10-CM | POA: Diagnosis not present

## 2022-05-13 DIAGNOSIS — C50411 Malignant neoplasm of upper-outer quadrant of right female breast: Secondary | ICD-10-CM

## 2022-05-13 DIAGNOSIS — K2100 Gastro-esophageal reflux disease with esophagitis, without bleeding: Secondary | ICD-10-CM

## 2022-05-13 DIAGNOSIS — J4520 Mild intermittent asthma, uncomplicated: Secondary | ICD-10-CM

## 2022-05-13 DIAGNOSIS — I10 Essential (primary) hypertension: Secondary | ICD-10-CM | POA: Diagnosis not present

## 2022-05-13 DIAGNOSIS — R739 Hyperglycemia, unspecified: Secondary | ICD-10-CM

## 2022-05-13 DIAGNOSIS — F419 Anxiety disorder, unspecified: Secondary | ICD-10-CM

## 2022-05-13 DIAGNOSIS — E7800 Pure hypercholesterolemia, unspecified: Secondary | ICD-10-CM

## 2022-05-13 DIAGNOSIS — J984 Other disorders of lung: Secondary | ICD-10-CM

## 2022-05-13 MED ORDER — PRAVASTATIN SODIUM 20 MG PO TABS: 20.0000 mg | ORAL_TABLET | Freq: Every day | ORAL | 5 refills | Status: DC

## 2022-05-13 MED ORDER — TRIAMTERENE-HCTZ 37.5-25 MG PO TABS: 1.0000 | ORAL_TABLET | Freq: Every day | ORAL | 5 refills | Status: DC

## 2022-05-13 MED ORDER — POTASSIUM CHLORIDE CRYS ER 10 MEQ PO TBCR: EXTENDED_RELEASE_TABLET | ORAL | 5 refills | Status: DC

## 2022-05-13 NOTE — Progress Notes (Signed)
Subjective:    [name], female    DOB: 14-May-1945, 77 y.o.   MRN: [id number]  Patient here for  Chief Complaint  Patient presents with   Medical Management of Chronic Issues    HPI Here to follow up regarding hypertension and stress and anxiety.  Seeing psychiatry now.  Reports that she felt she was doing better until a couple of weeks ago.  Started feeling more anxious.  Husband reports they were going out more - previously.  He also reports that she "sorts" her medication - for a couple of hours per day (in the morning).  This consist of moving them around and trying to get am meds and pm meds sorted.  This is occurring daily.  She also has been tot he pharmacy to help sort her medications.  I reviewed her medications and went through her pill boxes.  She had the wrong medications in her boxes - some days doubling up on medication.  It appears she is not taking lorazepam now.  Bottle empty.  Also , I am not sure if she is taking trazodone.  She is eating.  No vomiting.  No pain.  Main concern is increased stress with her medications and anxiety related.     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 Antony Blackbird   Hypertension    PONV (postoperative nausea and vomiting)    Scoliosis    Ulcer    Past Surgical History:  Procedure Laterality Date   ABDOMINAL HYSTERECTOMY     ANTERIOR CERVICAL DECOMP/DISCECTOMY FUSION N/A 04/19/2017   Procedure: Anterior Cervical Decompression Fusion Cervical Three-Four, Cervical Four-Five, Cervical Five-Six;  Surgeon: Donalee Citrin, MD;  Location: [facility] OR;  Service: Neurosurgery;  Laterality: N/A;  Anterior Cervical Decompression  Fusion Cervical Three-Four, Cervical Four-Five, Cervical Five-Six   BREAST BIOPSY Right 11/14/2020   [place] Bx, Venus Clip, Invasive Mammary Carcinoma   BREAST LUMPECTOMY Right 2022   With radiation   BREAST LUMPECTOMY WITH RADIOACTIVE SEED AND SENTINEL LYMPH NODE BIOPSY Right 12/27/2020   Procedure: RIGHT BREAST LUMPECTOMY WITH RADIOACTIVE SEED AND AXILLARY SENTINEL LYMPH NODE BIOPSY;  Surgeon: Emelia Loron, MD;  Location: [facility] OR;  Service: General;  Laterality: Right;   CHOLECYSTECTOMY  1996   DILATION AND CURETTAGE OF UTERUS  1971   LAPAROSCOPIC REMOVAL OF MESENTERIC MASS     LUMBAR DISC SURGERY  1998   [facility]   PARTIAL HYSTERECTOMY  1981   prolapsed uterus   TUBAL LIGATION  1978   Family History  Problem Relation Age of Onset   Stroke Mother    Cancer Father        unknown type   Cancer Brother        "blood cancer"   CVA Maternal Grandmother    Healthy Son    Breast cancer Neg Hx    Colon cancer Neg Hx    Mental illness Neg Hx    Social History   Socioeconomic History   Marital status: Married    Spouse name: Erie Noe"   Number of children: 2  Years of education: Not on file   Highest education level: 12th grade  Occupational History   Occupation: retired Runner, broadcasting/film/video  Tobacco Use   Smoking status: Never   Smokeless tobacco: Never  Vaping Use   Vaping Use: Never used  Substance and Sexual Activity   Alcohol use: No    Alcohol/week: 0.0 standard drinks of alcohol   Drug use: No   Sexual activity: Not Currently    Comment: not asked if sexually active  Other Topics Concern   Not on file  Social History Narrative   Right handed    Lives with husband   Had breast cancer surgery right side   Worked in the school system    Social Determinants of Health   Financial Resource Strain: Low Risk  (02/13/2022)   Overall Financial Resource Strain (CARDIA)    Difficulty of Paying Living Expenses: Not hard at all  Food Insecurity: No Food Insecurity (02/13/2022)    Hunger Vital Sign    Worried About Running Out of Food in the Last Year: Never true    Ran Out of Food in the Last Year: Never true  Transportation Needs: No Transportation Needs (02/13/2022)   PRAPARE - Administrator, Civil Service (Medical): No    Lack of Transportation (Non-Medical): No  Physical Activity: Unknown (02/13/2022)   Exercise Vital Sign    Days of Exercise per Week: 0 days    Minutes of Exercise per Session: Not on file  Stress: No Stress Concern Present (02/13/2022)   [employer] of Occupational Health - Occupational Stress Questionnaire    Feeling of Stress : Only a little  Social Connections: Unknown (02/13/2022)   Social Connection and Isolation Panel [NHANES]    Frequency of Communication with Friends and Family: More than three times a week    Frequency of Social Gatherings with Friends and Family: Not on file    Attends Religious Services: Not on file    Active Member of Clubs or Organizations: Not on file    Attends Banker Meetings: Not on file    Marital Status: Married     Review of Systems  Constitutional:  Negative for appetite change and unexpected weight change.  HENT:  Negative for congestion and sinus pressure.   Respiratory:  Negative for cough, chest tightness and shortness of breath.   Cardiovascular:  Negative for chest pain and palpitations.  Gastrointestinal:  Negative for abdominal pain, diarrhea, nausea and vomiting.  Genitourinary:  Negative for difficulty urinating and dysuria.  Musculoskeletal:  Negative for joint swelling and myalgias.  Skin:  Negative for color change and rash.  Neurological:  Negative for dizziness and headaches.  Psychiatric/Behavioral:         Increased stress and anxiety as outlined.         Objective:     BP 106/68   Pulse 82   Temp 98.1 F (36.7 C)   Resp 16   Ht 5\' 1"  (1.549 m)   Wt 127 lb (57.6 kg)   SpO2 98%   BMI 24.00 kg/m  Wt Readings from Last 3 Encounters:   05/13/22 127 lb (57.6 kg)  03/23/22 132 lb 12.8 oz (60.2 kg)  02/13/22 131 lb (59.4 kg)    Physical Exam Vitals reviewed.  Constitutional:      General: She is not in acute distress.    Appearance: Normal appearance.  HENT:     Head: Normocephalic and atraumatic.     Right Ear:  External ear normal.     Left Ear: External ear normal.  Eyes:     General: No scleral icterus.       Right eye: No discharge.        Left eye: No discharge.     Conjunctiva/sclera: Conjunctivae normal.  Neck:     Thyroid: No thyromegaly.  Cardiovascular:     Rate and Rhythm: Normal rate and regular rhythm.  Pulmonary:     Effort: No respiratory distress.     Breath sounds: Normal breath sounds. No wheezing.  Abdominal:     General: Bowel sounds are normal.     Palpations: Abdomen is soft.     Tenderness: There is no abdominal tenderness.  Musculoskeletal:        General: No swelling or tenderness.     Cervical back: Neck supple. No tenderness.  Lymphadenopathy:     Cervical: No cervical adenopathy.  Skin:    Findings: No erythema or rash.  Neurological:     Mental Status: She is alert.  Psychiatric:        Mood and Affect: Mood normal.        Behavior: Behavior normal.      Outpatient Encounter Medications as of 05/13/2022  Medication Sig   anastrozole (ARIMIDEX) 1 MG tablet Take 1 tablet (1 mg total) by mouth daily.   busPIRone (BUSPAR) 5 MG tablet Take 1 tablet (5 mg total) by mouth daily.   citalopram (CELEXA) 40 MG tablet Take 1 tablet (40 mg total) by mouth daily.   potassium chloride (KLOR-CON M) 10 MEQ tablet Take one tablet by mouth once daily.   pravastatin (PRAVACHOL) 20 MG tablet Take 1 tablet (20 mg total) by mouth daily.   Tiotropium Bromide-Olodaterol (STIOLTO RESPIMAT) 2.5-2.5 MCG/ACT AERS Inhale 1 puff into the lungs daily.   traZODone (DESYREL) 50 MG tablet Take 0.5-1 tablets (25-50 mg total) by mouth at bedtime as needed for sleep.   triamterene-hydrochlorothiazide  (MAXZIDE-25) 37.5-25 MG tablet Take 1 tablet by mouth daily.   [DISCONTINUED] Cyanocobalamin (B-12) 5000 MCG CAPS Take 5,000 Units by mouth daily. Metabolism support (Patient not taking: Reported on 04/08/2022)   [DISCONTINUED] LORazepam (ATIVAN) 0.5 MG tablet Take 1.5 tablets (0.75 mg total) by mouth daily.   [DISCONTINUED] potassium chloride (KLOR-CON M) 10 MEQ tablet Take one tablet by mouth once daily.   [DISCONTINUED] pravastatin (PRAVACHOL) 20 MG tablet Take 1 tablet (20 mg total) by mouth daily.   [DISCONTINUED] triamterene-hydrochlorothiazide (MAXZIDE-25) 37.5-25 MG tablet Take 1 tablet by mouth daily.   No facility-administered encounter medications on file as of 05/13/2022.     Lab Results  Component Value Date   WBC 8.7 03/23/2022   HGB 13.9 03/23/2022   HCT 40.0 03/23/2022   PLT 232 03/23/2022   GLUCOSE 92 05/13/2022   CHOL 151 05/13/2022   TRIG 83.0 05/13/2022   HDL 73.00 05/13/2022   LDLDIRECT 148.4 11/03/2012   LDLCALC 61 05/13/2022   ALT 9 05/13/2022   AST 18 05/13/2022   NA 140 05/13/2022   K 3.5 05/13/2022   CL 98 05/13/2022   CREATININE 1.17 05/13/2022   BUN 13 05/13/2022   CO2 32 05/13/2022   TSH 0.93 12/16/2021   HGBA1C 5.6 05/13/2022    DG Bone Density  Result Date: 12/24/2021 EXAM: DUAL X-RAY ABSORPTIOMETRY (DXA) FOR BONE MINERAL DENSITY IMPRESSION: Your patient Alix Lahmann completed a BMD test on 12/24/2021 using the [employer] iDXA DXA System (software version: 14.10) manufactured by [employer].  The following summarizes the results of our evaluation. Technologist::TNB PATIENT BIOGRAPHICAL: Name: Jennifer [name], Jennifer [name] Patient ID: [id number] Birth Date: [birth year] Height: 61.5 in. Gender: Female Exam Date: 12/24/2021 Weight: 134.0 lbs. Indications: Asthma, Caucasian, History of Breast Cancer, History of Radiation, Hysterectomy, Postmenopausal Fractures: Treatments: DENSITOMETRY RESULTS: Site         Region      Measured Date Measured Age WHO Classification  Young Adult T-score BMD         %Change vs. Previous Significant Change (*) DualFemur Total Right 12/24/2023 74.9 Osteopenia -2.1 0.749 g/cm2 -18.6% Yes DualFemur Total Right 08/17/2016 69.5 Normal -0.7 0.920 g/cm2 - - DualFemur Total Mean 12/24/2021 74.9 Osteopenia -1.9 0.768 g/cm2 -15.0% Yes DualFemur Total Mean 08/17/2016 69.5 Normal -0.8 0.904 g/cm2 - - Left Forearm Radius 33% 12/24/2021 74.9 Osteopenia -1.1 0.776 g/cm2 -7.4% Yes Left Forearm Radius 33% 08/17/2016 69.5 Normal -0.4 0.838 g/cm2 - - ASSESSMENT: The BMD measured at Femur Total Right is 0.749 g/cm2 with a T-score of -2.1. This patient is considered osteopenic according to World Health Organization [facility]) criteria. Compared with prior study, there has been significant decrease in the right total hip. Compared with prior study, there has been significant decrease in the total mean. Compared with prior study, there has been significant decrease in the left forearm. Lumbar spine was not utilized due to advanced degenerative changes. The scan quality is good. World Science writer [facility]) criteria for post-menopausal, Caucasian Women: Normal:                   T-score at or above -1 SD Osteopenia/low bone mass: T-score between -1 and -2.5 SD Osteoporosis:             T-score at or below -2.5 SD RECOMMENDATIONS: 1. All patients should optimize calcium and vitamin D intake. 2. Consider FDA-approved medical therapies in postmenopausal women and men aged 74 years and older, based on the following: a. A hip or vertebral(clinical or morphometric) fracture b. T-score < -2.5 at the femoral neck or spine after appropriate evaluation to exclude secondary causes c. Low bone mass (T-score between -1.0 and -2.5 at the femoral neck or spine) and a 10-year probability of a hip fracture > 3% or a 10-year probability of a major osteoporosis-related fracture > 20% based on the [place]-adapted WHO algorithm 3. Clinician judgment and/or patient preferences may indicate treatment  for people with 10-year fracture probabilities above or below these levels FOLLOW-UP: People with diagnosed cases of osteoporosis or at high risk for fracture should have regular bone mineral density tests. For patients eligible for Medicare, routine testing is allowed once every 2 years. The testing frequency can be increased to one year for patients who have rapidly progressing disease, those who are receiving or discontinuing medical therapy to restore bone mass, or have additional risk factors. I have reviewed this report, and agree with the above findings. [facility] Dear Malachy Mood, Your patient ESHAL PROPPS completed a FRAX assessment on 12/24/2021 using the [facility] iDXA DXA System (analysis version: 14.10) manufactured by [employer]. The following summarizes the results of our evaluation. PATIENT BIOGRAPHICAL: Name: Rami, Waddle Patient ID: [id number] Birth Date: Apr 09, 1945 Height:    61.5 in. Gender:     [age]    [age]       Weight:    134.0 lbs. Ethnicity:  White  Exam Date: 12/24/2021 FRAX* RESULTS:  (version: 3.5) 10-year Probability of Fracture1 Major Osteoporotic Fracture2 Hip Fracture 13.5% 3.5% Population: [place] (Caucasian) Risk Factors: None Based on DualFemur (Right) Neck BMD 1 -The 10-year probability of fracture may be lower than reported if the patient has received treatment. 2 -Major Osteoporotic Fracture: Clinical Spine, Forearm, Hip or Shoulder *FRAX is a Armed forces logistics/support/administrative officer of the [employer] of [employer] for Metabolic Bone Disease, a World Science writer (WHO) [employer]. ASSESSMENT: The probability of a major osteoporotic fracture is 13.5% within the next ten years. The probability of a hip fracture is 3.5% within the next ten years. . Electronically Signed   By: Bary Richard M.D.   On: 12/24/2021 15:34   MM DIAG BREAST TOMO BILATERAL  Result Date: 12/24/2021 CLINICAL DATA:  History of RIGHT lumpectomy in  December 2022. Patient is status post radiation. Patient is currently not taking tamoxifen due to tremors. EXAM: DIGITAL DIAGNOSTIC BILATERAL MAMMOGRAM WITH TOMOSYNTHESIS TECHNIQUE: Bilateral digital diagnostic mammography and breast tomosynthesis was performed. COMPARISON:  Previous exam(s). ACR Breast Density Category b: There are scattered areas of fibroglandular density. FINDINGS: There is density and architectural distortion within the RIGHT breast, consistent with postsurgical changes. These are new in comparison to prior, consistent with recent surgical intervention. No suspicious mass, distortion, or microcalcifications are identified to suggest presence of malignancy. IMPRESSION: No mammographic evidence of malignancy bilaterally. RECOMMENDATION: Recommend bilateral diagnostic mammogram in 1 year (with RIGHT and LEFT breast ultrasound if deemed necessary). I have discussed the findings and recommendations with the patient. If applicable, a reminder letter will be sent to the patient regarding the next appointment. BI-RADS CATEGORY  2: Benign. Electronically Signed   By: Meda Klinefelter M.D.   On: 12/24/2021 14:22      Assessment & Plan:  Essential hypertension, benign Assessment & Plan: Blood pressure as outlined.  On triam/hctz.  Follow pressures.  Follow metabolic panel.   Orders: -     Basic metabolic panel  Hyperglycemia Assessment & Plan: Follow met b and A1c.   Orders: -     Hemoglobin A1c  Hypercholesterolemia Assessment & Plan: On pravastatin.  Low cholesterol diet and exercise.  Follow lipid panel and liver function tests.    Orders: -     Hepatic function panel -     Lipid panel  Anxiety Assessment & Plan: Increased stress and anxiety as outlined.  Spending hours per day - sorting her medications (per husband).  Reviewed medications with her today.  Went over each medication with her individually.  Medications written out and placed in separate bags.  Sorted through  her pill boxes.  She had duplicates of medications on some days and left some medication out on other days.  Appears to be off lorazepam.  I am not sure if she is taking the trazodone.  Again, wrote out list for her and contacted psychiatry regarding above - for earlier appt and f/u.    Mild intermittent asthma without complication Assessment & Plan: Continue stiolto.  Breathing doing well.  Follow    Chronic restrictive lung disease Assessment & Plan: Breathing stable.    Gastroesophageal reflux disease with esophagitis without hemorrhage Assessment & Plan: No upper symptoms reported.  Continue PPI.    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female, estrogen receptor positive [facility]) Assessment & Plan: Saw oncology 11/27/21 - recommended continuing taloxifen 10mg  q hs.  Also mammogram and Dexa 12/24/21.     Other orders -  Pravastatin Sodium; Take 1 tablet (20 mg total) by mouth daily.  Dispense: 30 tablet; Refill: 5 -     Triamterene-HCTZ; Take 1 tablet by mouth daily.  Dispense: 30 tablet; Refill: 5 -     Potassium Chloride Crys ER; Take one tablet by mouth once daily.  Dispense: 30 tablet; Refill: 5   I spent 45 minutes with the patient.  Time spent discussing her current concerns and symptoms. Specifically time spent discussed her current symptoms and medication and sorting through her medications, pill boxes, etc.  Time also spent discussing further w/up, evaluation and treatment.    Dale Overly, MD

## 2022-05-14 ENCOUNTER — Encounter: Payer: Self-pay | Admitting: Psychiatry

## 2022-05-14 ENCOUNTER — Other Ambulatory Visit: Payer: Self-pay | Admitting: Psychiatry

## 2022-05-14 ENCOUNTER — Ambulatory Visit (INDEPENDENT_AMBULATORY_CARE_PROVIDER_SITE_OTHER): Payer: Medicare Other | Admitting: Psychiatry

## 2022-05-14 VITALS — BP 111/71 | HR 78 | Temp 98.2°F | Ht 61.0 in | Wt 128.0 lb

## 2022-05-14 DIAGNOSIS — F411 Generalized anxiety disorder: Secondary | ICD-10-CM | POA: Diagnosis not present

## 2022-05-14 DIAGNOSIS — F32A Depression, unspecified: Secondary | ICD-10-CM

## 2022-05-14 LAB — HEPATIC FUNCTION PANEL
ALT: 9 U/L (ref 0–35)
AST: 18 U/L (ref 0–37)
Albumin: 4.4 g/dL (ref 3.5–5.2)
Alkaline Phosphatase: 52 U/L (ref 39–117)
Bilirubin, Direct: 0.1 mg/dL (ref 0.0–0.3)
Total Bilirubin: 0.7 mg/dL (ref 0.2–1.2)
Total Protein: 6.8 g/dL (ref 6.0–8.3)

## 2022-05-14 LAB — BASIC METABOLIC PANEL
BUN: 13 mg/dL (ref 6–23)
CO2: 32 mEq/L (ref 19–32)
Calcium: 9.8 mg/dL (ref 8.4–10.5)
Chloride: 98 mEq/L (ref 96–112)
Creatinine, Ser: 1.17 mg/dL (ref 0.40–1.20)
GFR: 45.71 mL/min — ABNORMAL LOW (ref >60.00)
Glucose, Bld: 92 mg/dL (ref 70–99)
Potassium: 3.5 mEq/L (ref 3.5–5.1)
Sodium: 140 mEq/L (ref 135–145)

## 2022-05-14 LAB — LIPID PANEL
Cholesterol: 151 mg/dL (ref 0–200)
HDL: 73 mg/dL (ref >39.00)
LDL Cholesterol: 61 mg/dL (ref 0–99)
NonHDL: 78.07
Total CHOL/HDL Ratio: 2
Triglycerides: 83 mg/dL (ref 0.0–149.0)
VLDL: 16.6 mg/dL (ref 0.0–40.0)

## 2022-05-14 LAB — HEMOGLOBIN A1C: Hgb A1c MFr Bld: 5.6 % (ref 4.6–6.5)

## 2022-05-14 MED ORDER — GABAPENTIN 100 MG PO CAPS
100.0000 mg | ORAL_CAPSULE | Freq: Two times a day (BID) | ORAL | 1 refills | Status: DC
Start: 2022-05-14 — End: 2022-06-10

## 2022-05-14 NOTE — Patient Instructions (Signed)
Gabapentin Capsules or Tablets ?What is this medication? ?GABAPENTIN (GA ba pen tin) treats nerve pain. It may also be used to prevent and control seizures in people with epilepsy. It works by calming overactive nerves in your body. ?This medicine may be used for other purposes; ask your health care provider or pharmacist if you have questions. ?COMMON BRAND NAME(S): Active-PAC with Gabapentin, Gabarone, Gralise, Neurontin ?What should I tell my care team before I take this medication? ?They need to know if you have any of these conditions: ?Alcohol or substance use disorder ?Kidney disease ?Lung or breathing disease ?Suicidal thoughts, plans, or attempt; a previous suicide attempt by you or a family member ?An unusual or allergic reaction to gabapentin, other medications, foods, dyes, or preservatives ?Pregnant or trying to get pregnant ?Breast-feeding ?How should I use this medication? ?Take this medication by mouth with a glass of water. Follow the directions on the prescription label. You can take it with or without food. If it upsets your stomach, take it with food. Take your medication at regular intervals. Do not take it more often than directed. Do not stop taking except on your care team's advice. ?If you are directed to break the 600 or 800 mg tablets in half as part of your dose, the extra half tablet should be used for the next dose. If you have not used the extra half tablet within 28 days, it should be thrown away. ?A special MedGuide will be given to you by the pharmacist with each prescription and refill. Be sure to read this information carefully each time. ?Talk to your care team about the use of this medication in children. While this medication may be prescribed for children as young as 3 years for selected conditions, precautions do apply. ?Overdosage: If you think you have taken too much of this medicine contact a poison control center or emergency room at once. ?NOTE: This medicine is only for  you. Do not share this medicine with others. ?What if I miss a dose? ?If you miss a dose, take it as soon as you can. If it is almost time for your next dose, take only that dose. Do not take double or extra doses. ?What may interact with this medication? ?Alcohol ?Antihistamines for allergy, cough, and cold ?Certain medications for anxiety or sleep ?Certain medications for depression like amitriptyline, fluoxetine, sertraline ?Certain medications for seizures like phenobarbital, primidone ?Certain medications for stomach problems ?General anesthetics like halothane, isoflurane, methoxyflurane, propofol ?Local anesthetics like lidocaine, pramoxine, tetracaine ?Medications that relax muscles for surgery ?Opioid medications for pain ?Phenothiazines like chlorpromazine, mesoridazine, prochlorperazine, thioridazine ?This list may not describe all possible interactions. Give your health care provider a list of all the medicines, herbs, non-prescription drugs, or dietary supplements you use. Also tell them if you smoke, drink alcohol, or use illegal drugs. Some items may interact with your medicine. ?What should I watch for while using this medication? ?Visit your care team for regular checks on your progress. You may want to keep a record at home of how you feel your condition is responding to treatment. You may want to share this information with your care team at each visit. You should contact your care team if your seizures get worse or if you have any new types of seizures. Do not stop taking this medication or any of your seizure medications unless instructed by your care team. Stopping your medication suddenly can increase your seizures or their severity. ?This medication may cause serious skin   reactions. They can happen weeks to months after starting the medication. Contact your care team right away if you notice fevers or flu-like symptoms with a rash. The rash may be red or purple and then turn into blisters or  peeling of the skin. Or, you might notice a red rash with swelling of the face, lips or lymph nodes in your neck or under your arms. ?Wear a medical identification bracelet or chain if you are taking this medication for seizures. Carry a card that lists all your medications. ?This medication may affect your coordination, reaction time, or judgment. Do not drive or operate machinery until you know how this medication affects you. Sit up or stand slowly to reduce the risk of dizzy or fainting spells. Drinking alcohol with this medication can increase the risk of these side effects. ?Your mouth may get dry. Chewing sugarless gum or sucking hard candy, and drinking plenty of water may help. ?Watch for new or worsening thoughts of suicide or depression. This includes sudden changes in mood, behaviors, or thoughts. These changes can happen at any time but are more common in the beginning of treatment or after a change in dose. Call your care team right away if you experience these thoughts or worsening depression. ?If you become pregnant while using this medication, you may enroll in the [place] Antiepileptic Drug Pregnancy Registry by calling 1-888-233-2334. This registry collects information about the safety of antiepileptic medication use during pregnancy. ?What side effects may I notice from receiving this medication? ?Side effects that you should report to your care team as soon as possible: ?Allergic reactions or angioedema--skin rash, itching, hives, swelling of the face, eyes, lips, tongue, arms, or legs, trouble swallowing or breathing ?Rash, fever, and swollen lymph nodes ?Thoughts of suicide or self harm, worsening mood, feelings of depression ?Trouble breathing ?Unusual changes in mood or behavior in children after use such as difficulty concentrating, hostility, or restlessness ?Side effects that usually do not require medical attention (report to your care team if they continue or are  bothersome): ?Dizziness ?Drowsiness ?Nausea ?Swelling of ankles, feet, or hands ?Vomiting ?This list may not describe all possible side effects. Call your doctor for medical advice about side effects. You may report side effects to FDA at 1-800-FDA-1088. ?Where should I keep my medication? ?Keep out of reach of children and pets. ?Store at room temperature between 15 and 30 degrees C (59 and 86 degrees F). Get rid of any unused medication after the expiration date. ?This medication may cause accidental overdose and death if taken by other adults, children, or pets. ?To get rid of medications that are no longer needed or have expired: ?Take the medication to a medication take-back program. Check with your pharmacy or law enforcement to find a location. ?If you cannot return the medication, check the label or package insert to see if the medication should be thrown out in the garbage or flushed down the toilet. If you are not sure, ask your care team. If it is safe to put it in the trash, empty the medication out of the container. Mix the medication with cat litter, dirt, coffee grounds, or other unwanted substance. Seal the mixture in a bag or container. Put it in the trash. ?NOTE: This sheet is a summary. It may not cover all possible information. If you have questions about this medicine, talk to your doctor, pharmacist, or health care provider. ?? 2023 Elsevier/Gold Standard (2020-07-08 00:00:00) ? ?

## 2022-05-14 NOTE — Progress Notes (Signed)
BH MD OP Progress Note  05/14/2022 5:38 PM Jennifer [name]  MRN:  [id number]  Chief Complaint:  Chief Complaint  Patient presents with   Follow-up   Depression   Anxiety   Insomnia   Medication Refill   HPI: Jennifer [name] is a 77 year old Caucasian female, married, retired, lives in [place], has a history of anxiety, depression, COPD, malignant neoplasm of upper outer quadrant of right breast, estrogen positive, status post lumpectomy and radiation, currently in remission currently on anastrozole was evaluated in office today.  Patient's primary care provider Dr. Lorin Picket reached out to this provider yesterday reporting that patient has been confused about her medications and not taking it right, likely doubling up on some medications, noncompliant with lorazepam currently not on it.  Patient hence was scheduled for an appointment today.  Patient today presented with her husband,[name].  Collateral information was obtained from husband.  Patient appeared to be alert, oriented to person place time situation.  Patient however could not answer questions about her medications, reported that she does not remember the names of the medications.  She however reports she was able to get all her medications figured out yesterday at her primary care provider's office and has started taking them the right way.  Her husband also has been trying to help her with that.  Patient reports she continues to feel anxious today, feels internal tremulousness, nervousness.  According to husband she is more tremulous when she wakes up in the morning.  After she takes her medications it gets better.  She does not like to go out much however he has been trying to distract herself and keep her occupied by taking her out and that does seem to help.  Patient reports sleep as restless.  She goes to bed at around 11 PM and wakes up at around 3 AM some nights.  She does fall back asleep however she does not know how long it takes  for her to fall asleep.  She does wake up around 5 or 6 AM in the morning.  Patient is currently prescribed sleep medication, trazodone.  She however does know she is taking a medication at bedtime, although she does not know the name.  She does watch TV prior to falling asleep.  She does not have a good sleep hygiene.  Motivated to work on it.  Patient denies any suicidality, homicidality or perceptual disturbances.  An MMSE was done in session today.  Patient scored 30 out of 30.  Patient denies any other concerns today.  Visit Diagnosis:    ICD-10-CM   1. GAD (generalized anxiety disorder)  F41.1 gabapentin (NEURONTIN) 100 MG capsule    2. Depression, unspecified depression type  F32.A       Past Psychiatric History: I have reviewed past psychiatric history from progress note on 04/08/2022.  Past trials of BuSpar, Celexa, lorazepam.  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 Antony Blackbird   Hypertension    PONV (postoperative nausea and vomiting)    Scoliosis    Ulcer  Past Surgical History:  Procedure Laterality Date   ABDOMINAL HYSTERECTOMY     ANTERIOR CERVICAL DECOMP/DISCECTOMY FUSION N/A 04/19/2017   Procedure: Anterior Cervical Decompression Fusion Cervical Three-Four, Cervical Four-Five, Cervical Five-Six;  Surgeon: Donalee Citrin, MD;  Location: [facility] OR;  Service: Neurosurgery;  Laterality: N/A;  Anterior Cervical Decompression Fusion Cervical Three-Four, Cervical Four-Five, Cervical Five-Six   BREAST BIOPSY Right 11/14/2020   [place] Bx, Venus Clip, Invasive Mammary Carcinoma   BREAST LUMPECTOMY Right 2022   With radiation   BREAST  LUMPECTOMY WITH RADIOACTIVE SEED AND SENTINEL LYMPH NODE BIOPSY Right 12/27/2020   Procedure: RIGHT BREAST LUMPECTOMY WITH RADIOACTIVE SEED AND AXILLARY SENTINEL LYMPH NODE BIOPSY;  Surgeon: Emelia Loron, MD;  Location: [facility] OR;  Service: General;  Laterality: Right;   CHOLECYSTECTOMY  1996   DILATION AND CURETTAGE OF UTERUS  1971   LAPAROSCOPIC REMOVAL OF MESENTERIC MASS     LUMBAR DISC SURGERY  1998   [facility]   PARTIAL HYSTERECTOMY  1981   prolapsed uterus   TUBAL LIGATION  1978    Family Psychiatric History: Reviewed family psychiatric history from progress note on 04/08/2022.  Family History:  Family History  Problem Relation Age of Onset   Stroke Mother    Cancer Father        unknown type   Cancer Brother        "blood cancer"   CVA Maternal Grandmother    Healthy Son    Breast cancer Neg Hx    Colon cancer Neg Hx    Mental illness Neg Hx     Social History: Reviewed social history from progress note on 04/08/2022. Social History   Socioeconomic History   Marital status: Married    Spouse name: Erie Noe"   Number of children: 2   Years of education: Not on file   Highest education level: 12th grade  Occupational History   Occupation: retired Runner, broadcasting/film/video  Tobacco Use   Smoking status: Never   Smokeless tobacco: Never  Vaping Use   Vaping Use: Never used  Substance and Sexual Activity   Alcohol use: No    Alcohol/week: 0.0 standard drinks of alcohol   Drug use: No   Sexual activity: Not Currently    Comment: not asked if sexually active  Other Topics Concern   Not on file  Social History Narrative   Right handed    Lives with husband   Had breast cancer surgery right side   Worked in the school system    Social Determinants of Health   Financial Resource Strain: Low Risk  (02/13/2022)   Overall Financial Resource Strain (CARDIA)    Difficulty of Paying Living Expenses: Not hard at all  Food Insecurity: No Food Insecurity (02/13/2022)   Hunger Vital  Sign    Worried About Running Out of Food in the Last Year: Never true    Ran Out of Food in the Last Year: Never true  Transportation Needs: No Transportation Needs (02/13/2022)   PRAPARE - Administrator, Civil Service (Medical): No    Lack of Transportation (Non-Medical): No  Physical Activity: Unknown (02/13/2022)   Exercise Vital Sign    Days of Exercise per Week: 0 days    Minutes of Exercise per Session: Not on file  Stress: No Stress Concern Present (02/13/2022)   [employer] of Occupational Health - Occupational Stress Questionnaire    Feeling of Stress : Only a little  Social Connections: Unknown (02/13/2022)   Social Connection  and Isolation Panel [NHANES]    Frequency of Communication with Friends and Family: More than three times a week    Frequency of Social Gatherings with Friends and Family: Not on file    Attends Religious Services: Not on file    Active Member of Clubs or Organizations: Not on file    Attends Banker Meetings: Not on file    Marital Status: Married    Allergies:  Allergies  Allergen Reactions   Advair Diskus [Fluticasone-Salmeterol] Other (See Comments)    Causes blisters in her mouth   Codeine     Unknown reaction   Pollen Extract Cough    Metabolic Disorder Labs: Lab Results  Component Value Date   HGBA1C 5.6 05/13/2022   No results found for: "PROLACTIN" Lab Results  Component Value Date   CHOL 151 05/13/2022   TRIG 83.0 05/13/2022   HDL 73.00 05/13/2022   CHOLHDL 2 05/13/2022   VLDL 16.6 05/13/2022   LDLCALC 61 05/13/2022   LDLCALC 86 02/10/2022   Lab Results  Component Value Date   TSH 0.93 12/16/2021   TSH 0.95 06/04/2021    Therapeutic Level Labs: No results found for: "LITHIUM" No results found for: "VALPROATE" No results found for: "CBMZ"  Current Medications: Current Outpatient Medications  Medication Sig Dispense Refill   anastrozole (ARIMIDEX) 1 MG tablet Take 1 tablet (1 mg total)  by mouth daily. 30 tablet 11   busPIRone (BUSPAR) 5 MG tablet Take 1 tablet (5 mg total) by mouth daily. 30 tablet 5   citalopram (CELEXA) 40 MG tablet Take 1 tablet (40 mg total) by mouth daily. 90 tablet 0   gabapentin (NEURONTIN) 100 MG capsule Take 1 capsule (100 mg total) by mouth 2 (two) times daily. Take 1 tablet daily at noon and 1 tablet daily at bedtime 60 capsule 1   potassium chloride (KLOR-CON M) 10 MEQ tablet Take one tablet by mouth once daily. 30 tablet 5   pravastatin (PRAVACHOL) 20 MG tablet Take 1 tablet (20 mg total) by mouth daily. 30 tablet 5   Tiotropium Bromide-Olodaterol (STIOLTO RESPIMAT) 2.5-2.5 MCG/ACT AERS Inhale 1 puff into the lungs daily.     traZODone (DESYREL) 50 MG tablet Take 0.5-1 tablets (25-50 mg total) by mouth at bedtime as needed for sleep. 30 tablet 1   triamterene-hydrochlorothiazide (MAXZIDE-25) 37.5-25 MG tablet Take 1 tablet by mouth daily. 30 tablet 5   No current facility-administered medications for this visit.     Musculoskeletal: Strength & Muscle Tone: within normal limits Gait & Station: normal Patient leans: N/A  Psychiatric Specialty Exam: Review of Systems  Neurological:  Positive for tremors.  Psychiatric/Behavioral:  Positive for sleep disturbance. The patient is nervous/anxious.   All other systems reviewed and are negative.   Blood pressure 111/71, pulse 78, temperature 98.2 F (36.8 C), temperature source Skin, height  (1.549 m), weight 128 lb (58.1 kg).Body mass index is 24.19 kg/m.  General Appearance: Casual  Eye Contact:  Fair  Speech:  Clear and Coherent  Volume:  Normal  Mood:  Anxious  Affect:  Appropriate  Thought Process:  Goal Directed and Descriptions of Associations: Intact  Orientation:  Full (Time, Place, and Person)  Thought Content: Logical   Suicidal Thoughts:  No  Homicidal Thoughts:  No  Memory:  Immediate;   Fair Recent;   Fair Remote;   limited  Judgement:  Fair  Insight:  Fair   Psychomotor Activity:  Tremor  Concentration:  Concentration: Fair  and Attention Span: Fair  Recall:  [employer] of Knowledge: Fair  Language: Fair  Akathisia:  No  Handed:  Right  AIMS (if indicated): not done  Assets:  Communication Skills Desire for Improvement Housing Intimacy Social Support Transportation  ADL's:  Intact  Cognition: WNL  Sleep:  Poor   Screenings: GAD-7    Loss adjuster, chartered Office Visit from 05/14/2022 in [facility] Office Visit from 04/08/2022 in [facility]  Total GAD-7 Score 13 2      Mini-Mental    Flowsheet Row Clinical Support from 06/23/2016 in [facility] [employer] [facility]  Total Score (max 30 points ) 30      PHQ2-9    Flowsheet Row Office Visit from 05/14/2022 in [facility] Office Visit from 04/08/2022 in [facility] Clinical Support from 02/13/2022 in [facility] [employer] Visit from 12/16/2021 in [facility] [employer] [facility] Video Visit from 11/10/2021 in [facility] [employer] [facility]  PHQ-2 Total Score 2 1 0 0 0  PHQ-9 Total Score 11 3 -- -- --      Flowsheet Row Office Visit from 05/14/2022 in [facility] Office Visit from 04/08/2022 in [facility] Admission (Discharged) from 12/27/2020 in [place] [facility]IOPER[facility]IVE ARE[facility]  C-SSRS RISK CATEGORY No Risk No Risk No Risk        Assessment and Plan: BRIDIE COLQUHOUN is a 77 year old Caucasian female, married, retired, lives in [place] , has a history of anxiety, depression, breast cancer currently in remission, presented with anxiety symptoms, discussed plan as noted below.  Plan Generalized anxiety disorder-unstable Continue Celexa 40 mg p.o.  daily Continue BuSpar 5 mg p.o. daily Start gabapentin 100 mg p.o. daily at noon and 100 mg at bedtime. Discontinue lorazepam-patient currently not compliant. Also given her reported memory problems, benzodiazepines not appropriate for long-term use.  Depression unspecified-unstable Patient reports she feels depressed since her anxiety is out of control. Patient was referred for CBT in the past however was noncompliant. Today she is motivated to start psychotherapy, provided information for family solutions. Continue Trazodone 25-50 mg at bedtime as needed for sleep Advised to start working on sleep hygiene including switching of TV and other electronic devices at least an hour prior to bedtime.  Patient with complaints of memory problems-an MMSE was done in session today and she scored 30 out of 30.  Will monitor closely.   Collaboration of Care: Collaboration of Care: Other I have collaborated care with Dr. Kristine Royal care provider.  I have also obtained collateral information from spouse.  Spouse agrees to help with managing her medications and making sure she takes it regularly and follow the instructions.  I have referred this patient for psychotherapy-provided resources.  Patient/Guardian was advised Release of Information must be obtained prior to any record release in order to collaborate their care with an outside provider. Patient/Guardian was advised if they have not already done so to contact the registration department to sign all necessary forms in order for [place] to release information regarding their care.   Consent: Patient/Guardian gives verbal consent for treatment and assignment of benefits for services provided during this visit. Patient/Guardian expressed understanding and agreed to proceed.  Follow-up in clinic in 4 weeks or sooner if needed.  This note was generated in part or  whole with voice recognition software. Voice recognition is usually quite accurate but there  are transcription errors that can and very often do occur. I apologize for any typographical errors that were not detected and corrected.    Jomarie Longs, MD 05/15/2022, 12:49 PM

## 2022-05-15 ENCOUNTER — Other Ambulatory Visit: Payer: Self-pay

## 2022-05-15 DIAGNOSIS — R944 Abnormal results of kidney function studies: Secondary | ICD-10-CM

## 2022-05-15 DIAGNOSIS — J449 Chronic obstructive pulmonary disease, unspecified: Secondary | ICD-10-CM | POA: Diagnosis not present

## 2022-05-17 ENCOUNTER — Encounter: Payer: Self-pay | Admitting: Internal Medicine

## 2022-05-17 NOTE — Assessment & Plan Note (Signed)
No upper symptoms reported.  Continue PPI.  ?

## 2022-05-17 NOTE — Assessment & Plan Note (Signed)
Saw oncology 11/27/21 - recommended continuing taloxifen 10mg  q hs.  Also mammogram and Dexa 12/24/21.

## 2022-05-17 NOTE — Assessment & Plan Note (Signed)
Continue stiolto.  Breathing doing well.  Follow  

## 2022-05-17 NOTE — Assessment & Plan Note (Signed)
Increased stress and anxiety as outlined.  Spending hours per day - sorting her medications (per husband).  Reviewed medications with her today.  Went over each medication with her individually.  Medications written out and placed in separate bags.  Sorted through her pill boxes.  She had duplicates of medications on some days and left some medication out on other days.  Appears to be off lorazepam.  I am not sure if she is taking the trazodone.  Again, wrote out list for her and contacted psychiatry regarding above - for earlier appt and f/u.

## 2022-05-17 NOTE — Assessment & Plan Note (Signed)
Breathing stable.

## 2022-05-17 NOTE — Assessment & Plan Note (Signed)
Follow met b and A1c.  

## 2022-05-17 NOTE — Assessment & Plan Note (Signed)
Blood pressure as outlined.  On triam/hctz.  Follow pressures.  Follow metabolic panel.  

## 2022-05-17 NOTE — Assessment & Plan Note (Signed)
On pravastatin.  Low cholesterol diet and exercise.  Follow lipid panel and liver function tests.   

## 2022-05-19 ENCOUNTER — Telehealth: Payer: Self-pay

## 2022-05-19 ENCOUNTER — Telehealth: Payer: Self-pay | Admitting: Internal Medicine

## 2022-05-19 DIAGNOSIS — F411 Generalized anxiety disorder: Secondary | ICD-10-CM

## 2022-05-19 MED ORDER — LORAZEPAM 0.5 MG PO TABS
0.5000 mg | ORAL_TABLET | Freq: Every day | ORAL | 1 refills | Status: DC
Start: 2022-05-19 — End: 2022-06-10

## 2022-05-19 NOTE — Telephone Encounter (Signed)
Patient had reported stopping the lorazepam couple of weeks prior to her appointment here last week.  Patient also was anxious did not have any significant withdrawal symptoms while in session that day.  Since the plan was to taper off the lorazepam, did not restart the prescription.  Hence a low dose of gabapentin was added to help with anxiety.  If patient is having worsening problems or possible withdrawal symptoms will restart low dosage lorazepam 0.5 mg daily.  Will have CMA contact patient to discuss as well as discuss going to the nearest emergency department if withdrawal symptoms worsen.

## 2022-05-19 NOTE — Telephone Encounter (Signed)
Pt called in staying that she would like to speck to Dr.Scott. pt did not provided me with any other info.

## 2022-05-19 NOTE — Telephone Encounter (Signed)
no answer message was left to call office back to go over some issues with her medication that was made aware to [place] by her pharmacy.

## 2022-05-19 NOTE — Telephone Encounter (Signed)
page from [facility] called states that patient states that she is shaking all over, having increase anxiety, and nauses. she thinks it maybe coming from the sudden discontinuation of the lorazepam. pharmaist states that pt been on for years and now it has been suddenly stopped and now taking the gabapentin.

## 2022-05-20 ENCOUNTER — Ambulatory Visit: Payer: BC Managed Care – PPO | Admitting: Psychiatry

## 2022-05-20 NOTE — Telephone Encounter (Signed)
LMTCB

## 2022-05-20 NOTE — Telephone Encounter (Signed)
FYI   Spoke with patient. She says she is still feeling nervous. Advised that she should call Dr Elna Breslow to discuss since she has taken over managing her anxiety. Patient agreed and number was provided to her. Explained to patient that we are unsure how long she was taking her medication incorrectly and that it may take some time to get everything straightened out. Also mentioned that Dr Elna Breslow needs to be aware of increased anxiety to determine if medication adjustments should be made. She is going to call them today.

## 2022-05-22 ENCOUNTER — Other Ambulatory Visit (INDEPENDENT_AMBULATORY_CARE_PROVIDER_SITE_OTHER): Payer: Medicare Other

## 2022-05-22 ENCOUNTER — Other Ambulatory Visit: Payer: Medicare Other

## 2022-05-22 DIAGNOSIS — R944 Abnormal results of kidney function studies: Secondary | ICD-10-CM

## 2022-05-22 NOTE — Addendum Note (Signed)
Addended by: Warden Fillers on: 05/22/2022 11:40 AM   Modules accepted: Orders

## 2022-05-23 LAB — BASIC METABOLIC PANEL
BUN/Creatinine Ratio: 10 — ABNORMAL LOW (ref 12–28)
BUN: 9 mg/dL (ref 8–27)
CO2: 30 mmol/L — ABNORMAL HIGH (ref 20–29)
Calcium: 9.4 mg/dL (ref 8.7–10.3)
Chloride: 98 mmol/L (ref 96–106)
Creatinine, Ser: 0.91 mg/dL (ref 0.57–1.00)
Glucose: 116 mg/dL — ABNORMAL HIGH (ref 70–99)
Potassium: 3.2 mmol/L — ABNORMAL LOW (ref 3.5–5.2)
Sodium: 142 mmol/L (ref 134–144)
eGFR: 66 mL/min/{1.73_m2} (ref >59)

## 2022-05-26 ENCOUNTER — Other Ambulatory Visit: Payer: Self-pay

## 2022-05-26 DIAGNOSIS — E876 Hypokalemia: Secondary | ICD-10-CM

## 2022-05-28 ENCOUNTER — Other Ambulatory Visit (INDEPENDENT_AMBULATORY_CARE_PROVIDER_SITE_OTHER): Payer: Medicare Other

## 2022-05-28 DIAGNOSIS — E876 Hypokalemia: Secondary | ICD-10-CM | POA: Diagnosis not present

## 2022-05-28 LAB — POTASSIUM: Potassium: 3.7 mEq/L (ref 3.5–5.1)

## 2022-05-29 ENCOUNTER — Telehealth: Payer: Self-pay

## 2022-05-29 NOTE — Telephone Encounter (Signed)
Patient states she would like for [place] to please call her with her results.  Patient states she is unable to get her MyChart to work.

## 2022-05-29 NOTE — Telephone Encounter (Signed)
Spoke to the Patient and her husband regarding her lab results

## 2022-05-29 NOTE — Telephone Encounter (Signed)
Patient states she is returning our call.  I spoke with Prince Solian, CMA, and she has not called her, but she is able to give patient her lab results.  I transferred call to [facility].

## 2022-06-01 ENCOUNTER — Telehealth: Payer: Self-pay

## 2022-06-01 NOTE — Telephone Encounter (Signed)
-----   Message from Dale [place], MD sent at 05/29/2022  3:03 PM EDT ----- Noted.  Please schedule potassium recheck in 3 weeks.

## 2022-06-01 NOTE — Telephone Encounter (Signed)
Called patient call was disconnected. Just needs non fasting labs in 3 weeks to recheck potassium level.

## 2022-06-02 ENCOUNTER — Other Ambulatory Visit: Payer: Self-pay

## 2022-06-02 DIAGNOSIS — E876 Hypokalemia: Secondary | ICD-10-CM

## 2022-06-04 ENCOUNTER — Telehealth: Payer: Self-pay

## 2022-06-04 NOTE — Telephone Encounter (Signed)
pt was notified of the instructions per dr. Vanetta Shawl orders.

## 2022-06-04 NOTE — Telephone Encounter (Signed)
pt called she states she can not take the gabapentin. she is dizzy, nauses, gagging, not sleeping, and her legs feel week. she states she can not take it. she needs something else.

## 2022-06-04 NOTE — Telephone Encounter (Signed)
Please advise her to discontinue gabapentin. If her symptoms worsen despite discontinuation, I recommend she contact her PCP. I'll also include Dr. Elna Breslow for her review when she returns next week in case she has any other suggestions.

## 2022-06-05 ENCOUNTER — Telehealth: Payer: Self-pay | Admitting: Internal Medicine

## 2022-06-05 NOTE — Telephone Encounter (Signed)
Pt called in asking to speak to Dr. Lorin Picket. I asked what's going on and as per pt, she would like to let her know about anxiety and meds. As per pt, she does not need t speak to [place] just to Dr. Lorin Picket even if its after hrs.

## 2022-06-05 NOTE — Telephone Encounter (Signed)
FYI- sending to you for documentation.   Spoke with patient and discussed with Dr Lorin Picket. Patient says she is not tolerating gabapentin because she read the side effects and she is scared to continue. Dr Elna Breslow is out of office. Patient has taken 100 mg of gabapentin today. Says she is shaky. Confirmed no other symptoms. Per Dr Lorin Picket- Advised to take 100 mg of gabapentin on Sunday and then discontinue to taper her off. Advised to call with update on Tuesday and she is going to reach back out to Dr Elna Breslow next week.

## 2022-06-07 ENCOUNTER — Other Ambulatory Visit: Payer: Self-pay

## 2022-06-07 ENCOUNTER — Emergency Department (HOSPITAL_COMMUNITY)
Admission: EM | Admit: 2022-06-07 | Discharge: 2022-06-07 | Disposition: A | Discharge status: Home / Self Care | Payer: Medicare Other | Attending: Emergency Medicine | Admitting: Emergency Medicine

## 2022-06-07 ENCOUNTER — Emergency Department (HOSPITAL_COMMUNITY): Payer: Medicare Other

## 2022-06-07 DIAGNOSIS — E876 Hypokalemia: Secondary | ICD-10-CM | POA: Diagnosis not present

## 2022-06-07 DIAGNOSIS — F419 Anxiety disorder, unspecified: Secondary | ICD-10-CM | POA: Diagnosis not present

## 2022-06-07 DIAGNOSIS — I6782 Cerebral ischemia: Secondary | ICD-10-CM | POA: Insufficient documentation

## 2022-06-07 DIAGNOSIS — J449 Chronic obstructive pulmonary disease, unspecified: Secondary | ICD-10-CM | POA: Insufficient documentation

## 2022-06-07 DIAGNOSIS — R110 Nausea: Secondary | ICD-10-CM | POA: Diagnosis not present

## 2022-06-07 DIAGNOSIS — J45909 Unspecified asthma, uncomplicated: Secondary | ICD-10-CM | POA: Diagnosis not present

## 2022-06-07 DIAGNOSIS — I6381 Other cerebral infarction due to occlusion or stenosis of small artery: Secondary | ICD-10-CM | POA: Diagnosis not present

## 2022-06-07 DIAGNOSIS — R0602 Shortness of breath: Secondary | ICD-10-CM | POA: Diagnosis not present

## 2022-06-07 DIAGNOSIS — Z8673 Personal history of transient ischemic attack (TIA), and cerebral infarction without residual deficits: Secondary | ICD-10-CM | POA: Diagnosis not present

## 2022-06-07 LAB — COMPREHENSIVE METABOLIC PANEL
ALT: 14 U/L (ref 0–44)
AST: 28 U/L (ref 15–41)
Albumin: 4.1 g/dL (ref 3.5–5.0)
Alkaline Phosphatase: 49 U/L (ref 38–126)
Anion gap: 15 (ref 5–15)
BUN: 10 mg/dL (ref 8–23)
CO2: 23 mmol/L (ref 22–32)
Calcium: 9.6 mg/dL (ref 8.9–10.3)
Chloride: 98 mmol/L (ref 98–111)
Creatinine, Ser: 1.21 mg/dL — ABNORMAL HIGH (ref 0.44–1.00)
GFR, Estimated: 47 mL/min — ABNORMAL LOW (ref >60)
Glucose, Bld: 107 mg/dL — ABNORMAL HIGH (ref 70–99)
Potassium: 2.9 mmol/L — ABNORMAL LOW (ref 3.5–5.1)
Sodium: 136 mmol/L (ref 135–145)
Total Bilirubin: 1.2 mg/dL (ref 0.3–1.2)
Total Protein: 6.6 g/dL (ref 6.5–8.1)

## 2022-06-07 LAB — CBC WITH DIFFERENTIAL/PLATELET
Abs Immature Granulocytes: 0.02 10*3/uL (ref 0.00–0.07)
Basophils Absolute: 0.1 10*3/uL (ref 0.0–0.1)
Basophils Relative: 1 %
Eosinophils Absolute: 0.1 10*3/uL (ref 0.0–0.5)
Eosinophils Relative: 1 %
HCT: 40.2 % (ref 36.0–46.0)
Hemoglobin: 13.6 g/dL (ref 12.0–15.0)
Immature Granulocytes: 0 %
Lymphocytes Relative: 21 %
Lymphs Abs: 2.1 10*3/uL (ref 0.7–4.0)
MCH: 31.7 pg (ref 26.0–34.0)
MCHC: 33.8 g/dL (ref 30.0–36.0)
MCV: 93.7 fL (ref 80.0–100.0)
Monocytes Absolute: 0.7 10*3/uL (ref 0.1–1.0)
Monocytes Relative: 7 %
Neutro Abs: 6.8 10*3/uL (ref 1.7–7.7)
Neutrophils Relative %: 70 %
Platelets: 236 10*3/uL (ref 150–400)
RBC: 4.29 MIL/uL (ref 3.87–5.11)
RDW: 12.2 % (ref 11.5–15.5)
WBC: 9.8 10*3/uL (ref 4.0–10.5)
nRBC: 0 % (ref 0.0–0.2)

## 2022-06-07 LAB — BRAIN NATRIURETIC PEPTIDE: B Natriuretic Peptide: 21.5 pg/mL (ref 0.0–100.0)

## 2022-06-07 LAB — TROPONIN I (HIGH SENSITIVITY)
Troponin I (High Sensitivity): 12 ng/L (ref <18)
Troponin I (High Sensitivity): 10 ng/L (ref <18)

## 2022-06-07 LAB — D-DIMER, QUANTITATIVE: D-Dimer, Quant: <0.27 ug/mL-FEU (ref 0.00–0.50)

## 2022-06-07 LAB — MAGNESIUM: Magnesium: 1.8 mg/dL (ref 1.7–2.4)

## 2022-06-07 MED ORDER — POTASSIUM CHLORIDE CRYS ER 20 MEQ PO TBCR: 20.0000 meq | EXTENDED_RELEASE_TABLET | Freq: Every day | ORAL | 0 refills | Status: DC

## 2022-06-07 MED ORDER — LORAZEPAM 2 MG/ML PO CONC: 1.0000 mg | Freq: Once | ORAL | Status: DC

## 2022-06-07 MED ORDER — LORAZEPAM 1 MG PO TABS
1.0000 mg | ORAL_TABLET | Freq: Once | ORAL | Status: AC
  Administered 2022-06-07: 1 mg via ORAL
  Filled 2022-06-07: qty 1

## 2022-06-07 MED ORDER — DIAZEPAM 5 MG/ML IJ SOLN
2.5000 mg | Freq: Once | INTRAMUSCULAR | Status: AC
  Administered 2022-06-07: 2.5 mg via INTRAVENOUS
  Filled 2022-06-07: qty 2

## 2022-06-07 MED ORDER — POTASSIUM CHLORIDE 20 MEQ PO PACK
40.0000 meq | PACK | Freq: Two times a day (BID) | ORAL | Status: DC
  Administered 2022-06-07: 40 meq via ORAL
  Filled 2022-06-07: qty 2

## 2022-06-07 NOTE — Discharge Instructions (Addendum)
Increase potassium dose to 20 mEq for the next 2 weeks.  You will need to get this level rechecked by your doctor.

## 2022-06-07 NOTE — ED Provider Notes (Signed)
[facility] DEPARTMENT AT [facility] Provider Note   CSN: [id number] Arrival date & time: 06/07/22  4098     History  Chief Complaint  Patient presents with   Shortness of Breath   Anxiety    Jennifer [name] is a 77 y.o. female.  Pt is a 77 yo female with pmhx significant for anxiety, scoliosis, COPD (O2 at night), depression, asthma, arthritis, and GERD.  Pt had some sob last night.  EMS was called and O2 sat was 99%, so they did not bring her in.  She was more sob this am and extremely anxious, so her family plus a neighbor brought her in.  Pt does have a        Home Medications Prior to Admission medications   Medication Sig Start Date End Date Taking? Authorizing Provider  potassium chloride SA (KLOR-CON M) 20 MEQ tablet Take 1 tablet (20 mEq total) by mouth daily. 06/07/22  Yes Jacalyn Lefevre, MD  anastrozole (ARIMIDEX) 1 MG tablet Take 1 tablet (1 mg total) by mouth daily. 03/23/22   Causey, Larna Daughters, NP  busPIRone (BUSPAR) 5 MG tablet Take 1 tablet (5 mg total) by mouth daily. 03/30/22   Dale Clarendon, MD  citalopram (CELEXA) 40 MG tablet Take 1 tablet (40 mg total) by mouth daily. 04/08/22   Dale Meeker, MD  gabapentin (NEURONTIN) 100 MG capsule Take 1 capsule (100 mg total) by mouth 2 (two) times daily. Take 1 tablet daily at noon and 1 tablet daily at bedtime 05/14/22   Jomarie Longs, MD  LORazepam (ATIVAN) 0.5 MG tablet Take 1 tablet (0.5 mg total) by mouth daily. 05/19/22   Jomarie Longs, MD  pravastatin (PRAVACHOL) 20 MG tablet Take 1 tablet (20 mg total) by mouth daily. 05/13/22   Dale [place], MD  Tiotropium Bromide-Olodaterol (STIOLTO RESPIMAT) 2.5-2.5 MCG/ACT AERS Inhale 1 puff into the lungs daily. 09/02/20   [provider]  traZODone (DESYREL) 50 MG tablet Take 0.5-1 tablets (25-50 mg total) by mouth at bedtime as needed for sleep. 04/08/22   Jomarie Longs, MD  triamterene-hydrochlorothiazide (MAXZIDE-25) 37.5-25 MG  tablet Take 1 tablet by mouth daily. 05/13/22   Dale , MD      Allergies    Advair diskus [fluticasone-salmeterol], Codeine, and Pollen extract    Review of Systems   Review of Systems  Physical Exam Updated Vital Signs BP 133/86   Pulse 84   Temp 97.8 F (36.6 C) (Oral)   Resp 17   Ht 5\' 2"  (1.575 m)   Wt 59 kg   SpO2 100%   BMI 23.78 kg/m  Physical Exam  ED Results / Procedures / Treatments   Labs (all labs ordered are listed, but only abnormal results are displayed) Labs Reviewed  COMPREHENSIVE METABOLIC PANEL - Abnormal; Notable for the following components:      Result Value   Potassium 2.9 (*)    Glucose, Bld 107 (*)    Creatinine, Ser 1.21 (*)    GFR, Estimated 47 (*)    All other components within normal limits  CBC WITH DIFFERENTIAL/PLATELET  BRAIN NATRIURETIC PEPTIDE  MAGNESIUM  D-DIMER, QUANTITATIVE  TROPONIN I (HIGH SENSITIVITY)  TROPONIN I (HIGH SENSITIVITY)    EKG EKG Interpretation  Date/Time:  Sunday Jun 07 2022 09:56:32 EDT Ventricular Rate:  79 PR Interval:  124 QRS Duration: 80 QT Interval:  385 QTC Calculation: 442 R Axis:   58 Text Interpretation: Sinus rhythm Ventricular premature complex Minimal ST elevation, lateral  leads No significant change since last tracing Confirmed by Jacalyn Lefevre [phone]) on 06/07/2022 10:10:24 AM  Radiology CT Head Wo Contrast  Result Date: 06/07/2022 CLINICAL DATA:  77 year old female with history of neurologic deficit. Suspected stroke. EXAM: CT HEAD WITHOUT CONTRAST TECHNIQUE: Contiguous axial images were obtained from the base of the skull through the vertex without intravenous contrast. RADIATION DOSE REDUCTION: This exam was performed according to the departmental dose-optimization program which includes automated exposure control, adjustment of the mA and/or kV according to patient size and/or use of iterative reconstruction technique. COMPARISON:  None Available. FINDINGS: Brain: Mild cerebral  atrophy. Patchy and confluent areas of decreased attenuation are noted throughout the deep and periventricular white matter of the cerebral hemispheres bilaterally, compatible with chronic microvascular ischemic disease. Well-defined focus of low attenuation in the genu of the right internal capsule (axial image 21 of series 2), likely an old lacunar infarct. No evidence of acute infarction, hemorrhage, hydrocephalus, extra-axial collection or mass lesion/mass effect. Vascular: No hyperdense vessel or unexpected calcification. Skull: Normal. Negative for fracture or focal lesion. Sinuses/Orbits: No acute finding. Other: None. IMPRESSION: 1. No acute intracranial abnormalities. 2. 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 Electronically Signed   By: Trudie Reed M.D.   On: 06/07/2022 13:25   DG Chest Portable 1 View  Result Date: 06/07/2022 CLINICAL DATA:  77 year old female with shortness of breath and nausea. EXAM: PORTABLE CHEST 1 VIEW COMPARISON:  Chest radiographs 02/26/2015 and earlier. FINDINGS: Portable AP upright view at 0953 hours. Chronic severe thoracolumbar scoliosis, dextroconvex in most of the thoracic spine. Cervical ACDF since 2017. No acute osseous abnormality identified. Stable lung volumes and mediastinal contours. Allowing for portable technique the lungs are clear. Paucity of bowel gas. IMPRESSION: 1.  No acute cardiopulmonary abnormality. 2. Severe chronic scoliosis. Electronically Signed   By: Odessa Fleming M.D.   On: 06/07/2022 10:06    Procedures Procedures    Medications Ordered in ED Medications  potassium chloride (KLOR-CON) packet 40 mEq (40 mEq Oral Given 06/07/22 1257)  diazepam (VALIUM) injection 2.5 mg (2.5 mg Intravenous Given 06/07/22 1025)  LORazepam (ATIVAN) tablet 1 mg (1 mg Oral Given 06/07/22 1136)    ED Course/ Medical Decision Making/ A&P                              Medical Decision Making Amount and/or Complexity of Data Reviewed Labs: ordered. Radiology: ordered.  Risk Prescription drug management.   This patient presents to the ED for concern of sob, this involves an extensive number of treatment options, and is a complaint that carries with it a high risk of complications and morbidity.  The differential diagnosis includes copd, anxiety, pna   Co morbidities that complicate the patient evaluation  anxiety, scoliosis, COPD (O2 at night), depression, asthma, arthritis, and GERD   Additional history obtained:  Additional history obtained from epic chart review External records from outside source obtained and reviewed including family   Lab Tests:  I Ordered, and personally interpreted labs.  The pertinent results include:  cbc nl, cmp with k low at 2.9, mg 1.8, trop nl, bnp nl, ddimer neg   Imaging Studies ordered:  I ordered imaging studies including cxr and ct head I independently visualized and interpreted imaging which showed  CXR:   No acute cardiopulmonary abnormality.  2. Severe chronic scoliosis.  CT head: . No acute intracranial abnormalities.  2. Mild cerebral atrophy with mild chronic microvascular ischemic  changes in the cerebral white matter and old right-sided lacunar  infarct, as above.   I agree with the radiologist interpretation   Cardiac Monitoring:  The patient was maintained on a cardiac monitor.  I personally viewed and interpreted the cardiac monitored which showed an underlying rhythm of: nsr   Medicines ordered and prescription drug management:  I ordered medication including valium  for sx  Reevaluation of the patient after these medicines showed that the patient improved I have reviewed the patients home medicines and have made adjustments as needed   Test Considered:  CT   Critical Interventions:  Valium/ativa  Problem List / ED Course:  Hypokalemia:  k replaced.  Pt is  taking 10 meq daily.  I am going to have her increase that to 20 for the next 2 weeks. SOB:  likely anxiety.  She has clear lungs with good sats and normal cxr.  DDimer neg. She has requested a referral to a pulmonologist associated with [facility].  She lives close to [place] and would like a [facility] pulm referral there.  Pt told to f/u with pcp and return if worse   Reevaluation:  After the interventions noted above, I reevaluated the patient and found that they have :improved   Social Determinants of Health:  Lives at home   Dispostion:  After consideration of the diagnostic results and the patients response to treatment, I feel that the patent would benefit from discharge with outpatient f/u.          Final Clinical Impression(s) / ED Diagnoses Final diagnoses:  Anxiety  Hypokalemia    Rx / DC Orders ED Discharge Orders          Ordered    Ambulatory referral to Pulmonology        06/07/22 1114    potassium chloride SA (KLOR-CON M) 20 MEQ tablet  Daily        06/07/22 1146              Jacalyn Lefevre, MD 06/07/22 1341

## 2022-06-07 NOTE — ED Triage Notes (Signed)
Neighbor / husband stated, Jennifer [name] had some SOB that started last night and the EMS came and told her her oxygen was 99% and left. She was SOB this morning was feeling SOB and a lot of anxiety.

## 2022-06-07 NOTE — Telephone Encounter (Signed)
Called to check on pt.  Left message.  If feeling better off gabapentin, given she just started the medication, ok if she holds the dose Sunday.  Agree with letting us know how she is doing next week and f/u with psychiatry.

## 2022-06-08 NOTE — Telephone Encounter (Signed)
Noted. Pt is supposed to call with update tomorrow. Will call and check on her.

## 2022-06-09 NOTE — Telephone Encounter (Signed)
Pt daughter called again requesting to talk to the cma

## 2022-06-09 NOTE — Telephone Encounter (Signed)
Pt daughter Herbert Seta called in stating that her mom went to ED over the weekend because her anxiety its really really bad. Pt its also having panic attacks, sick in the morning, throwing up, etc. However, she was wondering if Dr. Lorin Picket would like to sent some meds over for this? Any questions, she can reach pt spouse or the pt herself.

## 2022-06-09 NOTE — Telephone Encounter (Signed)
FYI   Spoke with daughter and patient was present. Patients anxiety is extremely high. Having panic attacks through out the day. Complaining of being sob. Was seen in the ED this weekend but no psych work up done. Patient is completely off of gabapentin now. No change in symptoms. Advised daughter that psychiatry needed to be called so they can get involved and determine next steps and adjust medication regimen since we have referred to them to help get her anxiety under control. Daughter agreed and is going to reach out to them. Advised that if unable to get in touch with psych and symptoms persist or worsen- needs to be re-evaluated in ED. Daughter gave verbal understanding and thanked me for my time and help.

## 2022-06-09 NOTE — Telephone Encounter (Signed)
Agree with contacting psychiatry for help with anxiety.

## 2022-06-09 NOTE — Telephone Encounter (Signed)
Noted  

## 2022-06-10 ENCOUNTER — Ambulatory Visit (INDEPENDENT_AMBULATORY_CARE_PROVIDER_SITE_OTHER): Payer: Medicare Other | Admitting: Psychiatry

## 2022-06-10 ENCOUNTER — Encounter: Payer: Self-pay | Admitting: Psychiatry

## 2022-06-10 ENCOUNTER — Telehealth: Payer: Self-pay

## 2022-06-10 VITALS — BP 125/68 | HR 97 | Temp 98.4°F | Ht 62.0 in | Wt 126.0 lb

## 2022-06-10 DIAGNOSIS — Z91148 Patient's other noncompliance with medication regimen for other reason: Secondary | ICD-10-CM | POA: Diagnosis not present

## 2022-06-10 DIAGNOSIS — F411 Generalized anxiety disorder: Secondary | ICD-10-CM

## 2022-06-10 DIAGNOSIS — F321 Major depressive disorder, single episode, moderate: Secondary | ICD-10-CM

## 2022-06-10 MED ORDER — LORAZEPAM 0.5 MG PO TABS
0.5000 mg | ORAL_TABLET | Freq: Two times a day (BID) | ORAL | 1 refills | Status: DC
Start: 2022-06-10 — End: 2022-07-20

## 2022-06-10 MED ORDER — BUSPIRONE HCL 5 MG PO TABS: 5.0000 mg | ORAL_TABLET | Freq: Two times a day (BID) | ORAL | 1 refills | Status: DC

## 2022-06-10 NOTE — Progress Notes (Signed)
BH MD OP Progress Note  06/10/2022 6:26 PM BRENNEN BRINGER  MRN:  [id number]  Chief Complaint:  Chief Complaint  Patient presents with   Follow-up   Anxiety   Depression   Medication Problem   Medication Refill   HPI: MITHILA RIOLO is a 77 year old Caucasian female, married, retired, lives in [place], has a history of GAD, MDD, COPD, history of malignant neoplasm of upper outer quadrant of right breast, estrogen positive, status post lumpectomy and radiation, currently in remission, recent worsening of anxiety and possible withdrawal symptoms from benzodiazepines, was evaluated in office today.  Patient's daughter Herbert Seta contacted the office requesting medication management for worsening anxiety for patient given her recent emergency department visit on 06/07/2022.  This appointment was hence scheduled.  Patient presented with daughter Herbert Seta as well as patient's husband Gala Romney.  Family provided collateral information.  Reviewed emergency department visit-per Dr. Jacalyn Lefevre dated 06/07/2022-patient presented with shortness of breath, EMS was called last night at and an O2 sat was 99% hence they did not bring her in however her shortness of breath got worse this a.m. so a family member and the neighbor brought her to the hospital.  Patient had multiple workup done in the emergency department including EKG, cardiac monitoring, and of her head, chest x-ray.  Patient was medically cleared except for replacing her potassium level due to hypokalemia as well as providing her Valium for her anxiety symptoms.  Patient was also referred to pulmonologist.'  Per family who provided collateral information today , patient has been extremely anxious, shaky progressively getting worse since the past several weeks.  Patient likely not taking her medications correctly.  Possible concern of overuse especially of lorazepam since daughter reported patient is about to run out and has only 2 or 3 pills left.  They  were able to bring her prescription bottle in and she only had 3 pills left although this medication was filled on 05/19/2022.  Patient kept repeating throughout the session that her primary care provider threw out some of her pills and maybe that is why she ran out.  Patient appeared to be confused about this. (Of note -patient's primary care visit was prior to May 19, 2022 hence this prescription was picked up after her primary care office visit. ) Patient's daughter agreeable to assist patient with taking her medications with the support of her dad, patient's husband.  She is going to get a pill box and set up her medications regularly.  Patient observed in session as anxious, mild tremors of upper extremities, tearful at times.  Patient reports her anxiety symptoms as worse early in the morning and late evening.  Although prescribed a medication for sleep she did not even pick up the medication from the pharmacy.  Patient reports she did not do well on the gabapentin had worsening anxiety and tremors and hence decided to stop taking it.  Patient currently denies any suicidality, homicidality or perceptual disturbances.  Patient appeared to be alert, oriented to person place time and situation.    Visit Diagnosis:    ICD-10-CM   1. GAD (generalized anxiety disorder)  F41.1 LORazepam (ATIVAN) 0.5 MG tablet    busPIRone (BUSPAR) 5 MG tablet    2. Current moderate episode of major depressive disorder without prior episode (HCC)  F32.1     3. Overuse of medication  Z91.148    Benzodiazepine      Past Psychiatric History: I have reviewed past psychiatric history from progress  note on 04/08/2022.  Past trials of BuSpar, Celexa, lorazepam.  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 Antony Blackbird   Hypertension    PONV (postoperative nausea and vomiting)    Scoliosis    Ulcer     Past Surgical History:  Procedure Laterality Date   ABDOMINAL HYSTERECTOMY     ANTERIOR CERVICAL DECOMP/DISCECTOMY FUSION N/A 04/19/2017   Procedure: Anterior Cervical Decompression Fusion Cervical Three-Four, Cervical Four-Five, Cervical Five-Six;  Surgeon: Donalee Citrin, MD;  Location: [facility] OR;  Service: Neurosurgery;  Laterality: N/A;  Anterior Cervical Decompression Fusion Cervical Three-Four, Cervical Four-Five, Cervical Five-Six   BREAST BIOPSY Right 11/14/2020   [place] Bx, Venus Clip, Invasive Mammary Carcinoma   BREAST LUMPECTOMY Right 2022   With radiation   BREAST LUMPECTOMY WITH RADIOACTIVE SEED AND SENTINEL LYMPH NODE BIOPSY Right 12/27/2020   Procedure: RIGHT BREAST LUMPECTOMY WITH RADIOACTIVE SEED AND AXILLARY SENTINEL LYMPH NODE BIOPSY;  Surgeon: Emelia Loron, MD;  Location: [facility] OR;  Service: General;  Laterality: Right;   CHOLECYSTECTOMY  1996   DILATION AND CURETTAGE OF UTERUS  1971   LAPAROSCOPIC REMOVAL OF MESENTERIC MASS     LUMBAR DISC SURGERY  1998   [facility]   PARTIAL HYSTERECTOMY  1981   prolapsed uterus   TUBAL LIGATION  1978    Family Psychiatric History: I have reviewed family psychiatric history from progress note on 04/08/2022.  Family History:  Family History  Problem Relation Age of Onset   Stroke Mother    Cancer Father        unknown type   Cancer Brother        "blood cancer"   CVA Maternal Grandmother    Healthy Son    Breast cancer Neg Hx    Colon cancer Neg Hx    Mental illness Neg Hx     Social History: I have reviewed social history from progress note on 04/08/2022. Social History   Socioeconomic History   Marital status: Married    Spouse name: Erie Noe"   Number of children: 2   Years of education: Not on  file   Highest education level: 12th grade  Occupational History   Occupation: retired Runner, broadcasting/film/video  Tobacco Use   Smoking status: Never   Smokeless tobacco: Never  Vaping Use   Vaping Use: Never used  Substance and Sexual Activity   Alcohol use: No    Alcohol/week: 0.0 standard drinks of alcohol   Drug use: No   Sexual activity: Not Currently    Comment: not asked if sexually active  Other Topics Concern   Not on file  Social History Narrative   Right handed    Lives with husband   Had breast cancer surgery right side   Worked in the school system    Social Determinants of Health   Financial Resource Strain: Low Risk  (02/13/2022)   Overall Financial Resource Strain (CARDIA)    Difficulty of Paying Living Expenses: Not hard at all  Food Insecurity: No Food Insecurity (02/13/2022)   Hunger Vital Sign  Worried About Programme researcher, broadcasting/film/video in the Last Year: Never true    Ran Out of Food in the Last Year: Never true  Transportation Needs: No Transportation Needs (02/13/2022)   PRAPARE - Administrator, Civil Service (Medical): No    Lack of Transportation (Non-Medical): No  Physical Activity: Unknown (02/13/2022)   Exercise Vital Sign    Days of Exercise per Week: 0 days    Minutes of Exercise per Session: Not on file  Stress: No Stress Concern Present (02/13/2022)   [employer] of Occupational Health - Occupational Stress Questionnaire    Feeling of Stress : Only a little  Social Connections: Unknown (02/13/2022)   Social Connection and Isolation Panel [NHANES]    Frequency of Communication with Friends and Family: More than three times a week    Frequency of Social Gatherings with Friends and Family: Not on file    Attends Religious Services: Not on file    Active Member of Clubs or Organizations: Not on file    Attends Banker Meetings: Not on file    Marital Status: Married    Allergies:  Allergies  Allergen Reactions   Advair Diskus  [Fluticasone-Salmeterol] Other (See Comments)    Causes blisters in her mouth   Codeine     Unknown reaction   Pollen Extract Cough    Metabolic Disorder Labs: Lab Results  Component Value Date   HGBA1C 5.6 05/13/2022   No results found for: "PROLACTIN" Lab Results  Component Value Date   CHOL 151 05/13/2022   TRIG 83.0 05/13/2022   HDL 73.00 05/13/2022   CHOLHDL 2 05/13/2022   VLDL 16.6 05/13/2022   LDLCALC 61 05/13/2022   LDLCALC 86 02/10/2022   Lab Results  Component Value Date   TSH 0.93 12/16/2021   TSH 0.95 06/04/2021    Therapeutic Level Labs: No results found for: "LITHIUM" No results found for: "VALPROATE" No results found for: "CBMZ"  Current Medications: Current Outpatient Medications  Medication Sig Dispense Refill   citalopram (CELEXA) 40 MG tablet Take 1 tablet (40 mg total) by mouth daily. 90 tablet 0   potassium chloride SA (KLOR-CON M) 20 MEQ tablet Take 1 tablet (20 mEq total) by mouth daily. 14 tablet 0   pravastatin (PRAVACHOL) 20 MG tablet Take 1 tablet (20 mg total) by mouth daily. 30 tablet 5   Tiotropium Bromide-Olodaterol (STIOLTO RESPIMAT) 2.5-2.5 MCG/ACT AERS Inhale 1 puff into the lungs daily.     traZODone (DESYREL) 50 MG tablet Take 0.5-1 tablets (25-50 mg total) by mouth at bedtime as needed for sleep. 30 tablet 1   triamterene-hydrochlorothiazide (MAXZIDE-25) 37.5-25 MG tablet Take 1 tablet by mouth daily. 30 tablet 5   anastrozole (ARIMIDEX) 1 MG tablet Take 1 tablet (1 mg total) by mouth daily. (Patient not taking: Reported on 06/10/2022) 30 tablet 11   busPIRone (BUSPAR) 5 MG tablet Take 1 tablet (5 mg total) by mouth 2 (two) times daily. 60 tablet 1   LORazepam (ATIVAN) 0.5 MG tablet Take 1 tablet (0.5 mg total) by mouth 2 (two) times daily. 60 tablet 1   No current facility-administered medications for this visit.     Musculoskeletal: Strength & Muscle Tone: within normal limits Gait & Station: normal Patient leans:  N/A  Psychiatric Specialty Exam: Review of Systems  Unable to perform ROS: Psychiatric disorder    Blood pressure 125/68, pulse 97, temperature 98.4 F (36.9 C), temperature source Skin, height 5\' 2"  (1.575 m), weight  126 lb (57.2 kg).Body mass index is 23.05 kg/m.  General Appearance: Fairly Groomed  Eye Contact:  Fair  Speech:  Normal Rate  Volume:  Normal  Mood:  Anxious and Depressed  Affect:  Tearful  Thought Process:  Goal Directed and Descriptions of Associations: Circumstantial  Orientation:  Full (Time, Place, and Person)  Thought Content: Logical   Suicidal Thoughts:  No  Homicidal Thoughts:  No  Memory:  Immediate;   Fair Recent;   Fair Remote;   Limited  Judgement:  Other:  Limited  Insight:  Shallow  Psychomotor Activity:  Tremor  Concentration:  Concentration: Poor and Attention Span: Poor  Recall:  Poor  Fund of Knowledge: Fair  Language: Fair  Akathisia:  No  Handed:  Right  AIMS (if indicated): not done  Assets:  Desire for Improvement Social Support Transportation  ADL's:  Intact  Cognition: Baseline  Sleep: Restless at times   Screenings: GAD-7    Flowsheet Row Office Visit from 06/10/2022 in [facility] Office Visit from 05/14/2022 in [facility] Office Visit from 04/08/2022 in [facility]  Total GAD-7 Score 21 13 2       Mini-Mental    Flowsheet Row Clinical Support from 06/23/2016 in [facility] [employer]  Total Score (max 30 points ) 30      PHQ2-9    Flowsheet Row Office Visit from 06/10/2022 in [facility] Office Visit from 05/14/2022 in [facility] Office Visit from 04/08/2022 in [facility] Clinical Support from 02/13/2022 in [facility] [employer] Visit from 12/16/2021 in [facility] [employer]  PHQ-2 Total Score 6 2 1  0 0  PHQ-9 Total Score 23 11 3  -- --      Flowsheet Row Office Visit from 06/10/2022 in [facility] ED from 06/07/2022 in [facility] Department at [facility] Office Visit from 05/14/2022 in [facility]  C-SSRS RISK CATEGORY No Risk No Risk No Risk        Assessment and Plan: CHAREL TANCREDI is a 77 year old Caucasian female, married, retired, lives in [place], has a history of anxiety, depression, breast cancer currently in remission, presented with worsening anxiety symptoms possible overuse of lorazepam as well as recent withdrawal symptoms-discussed plan as noted below.  Plan GAD-unstable Continue Celexa 40 mg p.o. daily Will consider changing Celexa to another antidepressant/SSRI or SNRI in the future if she continues to have worsening anxiety symptoms. Increase BuSpar to 5 mg p.o. twice daily Restart lorazepam 0.5 mg p.o. twice daily. Reviewed [place] PMP AWARxE  MDD-unstable Increase BuSpar to 5 mg p.o. twice daily Continue Celexa 40 mg p.o. daily Start trazodone 25-50 mg p.o. nightly as needed for sleep-patient encouraged to pick up this prescription from pharmacy. Provided information for therapist in the community-patient has been noncompliant with scheduling appointment with therapist.  Encouraged her to do so.  Overuse of medication-benzodiazepine-unstable with possible withdrawal symptoms Patient with recent emergency department visit with worsening anxiety symptoms.  Although a prescription for lorazepam was provided on 05/19/2022-patient only has 3 more pills left in the bottle, hence concerns for overuse.  Patient also appeared to be confused in session in terms of medications names and how she is supposed to take her medication. Some time was  spent discussing and  educating patient as well as family members in session-daughter as well as patient's spouse.  Patient's daughter agrees to set up her pillbox and husband agrees to give out her medications that way patient does not overuse her medications.  Provided education about long-term use of benzodiazepine, potential for habit-forming/addictive potential as well as concerns of withdrawal.  Discussed with patient she will not be provided a prescription refills for lorazepam if she continues to overuse her medication after this appointment.  Patient as well as family voiced understanding.  Collaboration of Care: Collaboration of Care: Other I have reviewed notes from emergency department visit- Ms.Julie Haviland -as noted above.  Patient encouraged to establish care with therapist-daughter agrees to help patient with this.  Patient has been noncompliant.  Patient/Guardian was advised Release of Information must be obtained prior to any record release in order to collaborate their care with an outside provider. Patient/Guardian was advised if they have not already done so to contact the registration department to sign all necessary forms in order for [place] to release information regarding their care.   Consent: Patient/Guardian gives verbal consent for treatment and assignment of benefits for services provided during this visit. Patient/Guardian expressed understanding and agreed to proceed.   I have spent atleast 40 minutes face to face with patient today which includes the time spent for preparing to see the patient ( e.g., review of test, records ), obtaining and to review and separately obtained history , ordering medications and test ,psychoeducation and supportive psychotherapy and care coordination,as well as documenting clinical information in electronic health record,interpreting and communication of test results.  This note was generated in part or whole with voice recognition software. Voice recognition is  usually quite accurate but there are transcription errors that can and very often do occur. I apologize for any typographical errors that were not detected and corrected.     Jomarie Longs, MD 06/10/2022, 6:26 PM

## 2022-06-10 NOTE — Telephone Encounter (Signed)
Please contact patient to come in for a visit today , we have an open slot available, if she is unable to do that she may have to go to urgent care [facility] . Will need to evaluate for medication changes .

## 2022-06-10 NOTE — Patient Instructions (Addendum)
Start taking Lorazepam 0.5 mg twice daily . Please do not take more than what is prescribed. Please have your daughter and husband help with medications every day so you do not get confused and take more than what is prescribed . Please be aware you can have serious withdrawal symptoms from Lorazepam if you overuse and run out quicker than your scheduled refill date. Please go to nearest emergency department or call 911 if you have any withdrawal symptoms like shakes confusion shortness of breath.  Please start taking Buspar 5 mg twice daily - dose change    www.[email or web address].org  www.psychologytoday  [employer] [email or web address] [address], [place], [place] [address]  [phone]  [facility] [email or web address] [address], [place], [place] [address]   [phone]   Family solutions - [phone]  [facility] - [phone]  [facility] - [phone] counseling [phone]  [facility] [phone]   [email or web address] this clinician can offer telehealth and has a sliding scale option  [email or web address]/ this group also offers sliding scale rates and is based out of [place]  [employer] [facility] has interns who offer sliding scale rates and some of the full time clinicians do, as well. You complete their contact form on their website and the referrals coordinator will help to get connected to someone

## 2022-06-10 NOTE — Telephone Encounter (Signed)
pt dauhter called yesterday at 3:50 states her mother was at the ER Sunday because of panic attacks and anxiety. pt stopped the gabapentin. daughter asked if something can be called in that works fast. that her mother can not function.   Pt was last seen on 4-25 next appt 6-24

## 2022-06-14 DIAGNOSIS — J449 Chronic obstructive pulmonary disease, unspecified: Secondary | ICD-10-CM | POA: Diagnosis not present

## 2022-06-17 ENCOUNTER — Telehealth: Payer: Self-pay

## 2022-06-17 NOTE — Telephone Encounter (Signed)
Transition Care Management Unsuccessful Follow-up Telephone Call  Date of discharge and from where:  06/07/2022 [facility]  Attempts:  1st Attempt  Reason for unsuccessful TCM follow-up call:  Left voice message  Carrol Bondar [facility]  [facility] Population Health Community Resource Care Guide   [email or web address]  ?? [phone]   Website: [email or web address]  [email or web address]

## 2022-06-18 ENCOUNTER — Telehealth: Payer: Self-pay

## 2022-06-18 NOTE — Telephone Encounter (Signed)
Transition Care Management Follow-up Telephone Call Date of discharge and from where: 06/08/2022 [facility] How have you been since you were released from the hospital? Patient is still experiencing panic attacks Any questions or concerns? No  Items Reviewed: Did the pt receive and understand the discharge instructions provided? Yes  Medications obtained and verified? Yes  Other? No  Any new allergies since your discharge? No  Dietary orders reviewed? Yes Do you have support at home? Yes   Follow up appointments reviewed:  PCP Hospital f/u appt confirmed? No  Scheduled to see  on  @ . Specialist Hospital f/u appt confirmed? Yes  Scheduled to see Jomarie Longs, MD on 06/10/2022 @ [facility]. Are transportation arrangements needed? No  If their condition worsens, is the pt aware to call PCP or go to the Emergency Dept.? Yes Was the patient provided with contact information for the PCP's office or ED? Yes Was to pt encouraged to call back with questions or concerns? Yes   [facility]  [facility] Population Health Community Resource Care Guide   [email or web address]  ?? [phone]   Website: [email or web address]  [email or web address]

## 2022-06-23 ENCOUNTER — Telehealth: Payer: Self-pay | Admitting: Internal Medicine

## 2022-06-23 ENCOUNTER — Other Ambulatory Visit: Payer: Medicare Other

## 2022-06-23 NOTE — Telephone Encounter (Signed)
Last potassium was a 2.9 at the hospital. Do you want to repeat a potassium on her prior to her 6/26 appt to confirm she should stay on this dose?

## 2022-06-23 NOTE — Telephone Encounter (Signed)
Called husband to schedule potassium recheck. Husband said that he has had covid and cannot bring her this week (he says patient is not sick) but will call back and schedule appt to have it checked beginning of next week.

## 2022-06-23 NOTE — Telephone Encounter (Signed)
Yes - recheck potassium.  This week if possible.

## 2022-06-23 NOTE — Telephone Encounter (Signed)
Prescription Request  06/23/2022  LOV: 05/13/2022  What is the name of the medication or equipment? potassium chloride SA (KLOR-CON M) 20 MEQ tablet   Have you contacted your pharmacy to request a refill? Yes   Which pharmacy would you like this sent to?  [facility] - [place], [place] - [address] [place] [address] Phone: [phone] Fax: [phone]    Patient notified that their request is being sent to the clinical staff for review and that they should receive a response within 2 business days.   Please advise at [facility] [phone]

## 2022-06-24 ENCOUNTER — Telehealth: Payer: Self-pay

## 2022-06-24 NOTE — Telephone Encounter (Signed)
pt daughter called left messages that there has not been any changes she feels like she is worse after you made medicaiton changes.   Pt was last seen on 5-22 and next appt 6-24

## 2022-06-24 NOTE — Telephone Encounter (Signed)
Please let them know to schedule an appointment ASAP , I do have an open slot this Friday - please make it 30 minutes . If she needs immediate help please advise to go to nearest urgent care or emergency department.

## 2022-06-26 ENCOUNTER — Ambulatory Visit (INDEPENDENT_AMBULATORY_CARE_PROVIDER_SITE_OTHER): Payer: Medicare Other | Admitting: Psychiatry

## 2022-06-26 ENCOUNTER — Encounter: Payer: Self-pay | Admitting: Psychiatry

## 2022-06-26 VITALS — BP 108/71 | HR 89 | Temp 98.2°F | Ht 62.0 in | Wt 122.4 lb

## 2022-06-26 DIAGNOSIS — F321 Major depressive disorder, single episode, moderate: Secondary | ICD-10-CM | POA: Diagnosis not present

## 2022-06-26 DIAGNOSIS — F411 Generalized anxiety disorder: Secondary | ICD-10-CM

## 2022-06-26 DIAGNOSIS — Z91148 Patient's other noncompliance with medication regimen for other reason: Secondary | ICD-10-CM

## 2022-06-26 MED ORDER — BUSPIRONE HCL 10 MG PO TABS
10.0000 mg | ORAL_TABLET | Freq: Three times a day (TID) | ORAL | 1 refills | Status: DC
Start: 2022-06-26 — End: 2022-08-28

## 2022-06-26 NOTE — Progress Notes (Signed)
BH MD OP Progress Note  06/26/2022 1:25 PM Jennifer [name]  MRN:  [id number]  Chief Complaint:  Chief Complaint  Patient presents with   Follow-up   Anxiety   Depression   Panic Attack   Medication Refill   HPI: Jennifer [name] is a 77 year old Caucasian female, married, retired, lives in [place], has a history of GAD, MDD, COPD, history of malignant neoplasm of upper outer quadrant of right breast, estrogen positive, status post lumpectomy and radiation currently in remission, presented for follow-up appointment with worsening anxiety symptoms.  Patient being a limited historian majority of information obtained from daughter Herbert Seta.  As per daughter patient since her most recent changes with medications including the addition of BuSpar does not seem to have made any progress with regards to her anxiety symptoms.  She wakes up most mornings feeling extremely tremulous and anxious.  She takes the lorazepam at night and most nights she is able to sleep around 4 hours.  She does wake up in the middle of the night in a panic .  Patient often having crying spells since she is in a state of anxiety as well as feels something bad is going to happen to her.  Patient also with reduced appetite.  This has been going on since the past several months.  She has to be forced to eat.  Hence it has been a struggle.  Patient also with some dizziness.  Unknown if this is medication induced or not.  Does not happen all the time though.  Patient today in session appeared to be anxious and tearful often.  Patient appeared to be alert, oriented to person place time situation.  Reports she has been having overwhelming anxiety.  She does not feel any better.  Patient does have psychosocial stressors including her spouse as well as daughter recently had COVID-19 infection.  Patient has not had any symptoms.  Patient in session today completed an MMSE and scored 26 out of 30.  Patient during session was observed as having  mild dizziness when she tried to get out of her chair although it did not last.  Questionable orthostatic.  Patient denies any suicidality, homicidality or perceptual disturbances.  Patient as well as family agreeable to dosage increase of BuSpar.     Visit Diagnosis:    ICD-10-CM   1. GAD (generalized anxiety disorder)  F41.1 busPIRone (BUSPAR) 10 MG tablet    2. Current moderate episode of major depressive disorder without prior episode (HCC)  F32.1 busPIRone (BUSPAR) 10 MG tablet    3. Overuse of medication  Z91.148    Benzodiazepines      Past Psychiatric History: I have reviewed past psychiatric history from progress note on 04/08/2022.  Past trials of BuSpar, Celexa, lorazepam.  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 Antony Blackbird   Hypertension    PONV (postoperative nausea and vomiting)    Scoliosis    Ulcer     Past Surgical History:  Procedure Laterality Date   ABDOMINAL HYSTERECTOMY     ANTERIOR  CERVICAL DECOMP/DISCECTOMY FUSION N/A 04/19/2017   Procedure: Anterior Cervical Decompression Fusion Cervical Three-Four, Cervical Four-Five, Cervical Five-Six;  Surgeon: Donalee Citrin, MD;  Location: [facility] OR;  Service: Neurosurgery;  Laterality: N/A;  Anterior Cervical Decompression Fusion Cervical Three-Four, Cervical Four-Five, Cervical Five-Six   BREAST BIOPSY Right 11/14/2020   [place] Bx, Venus Clip, Invasive Mammary Carcinoma   BREAST LUMPECTOMY Right 2022   With radiation   BREAST LUMPECTOMY WITH RADIOACTIVE SEED AND SENTINEL LYMPH NODE BIOPSY Right 12/27/2020   Procedure: RIGHT BREAST LUMPECTOMY WITH RADIOACTIVE SEED AND AXILLARY SENTINEL  LYMPH NODE BIOPSY;  Surgeon: Emelia Loron, MD;  Location: [facility] OR;  Service: General;  Laterality: Right;   CHOLECYSTECTOMY  1996   DILATION AND CURETTAGE OF UTERUS  1971   LAPAROSCOPIC REMOVAL OF MESENTERIC MASS     LUMBAR DISC SURGERY  1998   [facility]   PARTIAL HYSTERECTOMY  1981   prolapsed uterus   TUBAL LIGATION  1978    Family Psychiatric History: I have reviewed family psychiatric history from progress note on 04/08/2022.  Family History:  Family History  Problem Relation Age of Onset   Stroke Mother    Cancer Father        unknown type   Cancer Brother        "blood cancer"   CVA Maternal Grandmother    Healthy Son    Breast cancer Neg Hx    Colon cancer Neg Hx    Mental illness Neg Hx     Social History: I have reviewed social history from progress note on 04/08/2022. Social History   Socioeconomic History   Marital status: Married    Spouse name: Erie Noe"   Number of children: 2   Years of education: Not on file   Highest education level: 12th grade  Occupational History   Occupation: retired Runner, broadcasting/film/video  Tobacco Use   Smoking status: Never   Smokeless tobacco: Never  Vaping Use   Vaping Use: Never used  Substance and Sexual Activity   Alcohol use: No    Alcohol/week: 0.0 standard drinks of alcohol   Drug use: No   Sexual activity: Not Currently    Comment: not asked if sexually active  Other Topics Concern   Not on file  Social History Narrative   Right handed    Lives with husband   Had breast cancer surgery right side   Worked in the school system    Social Determinants of Health   Financial Resource Strain: Low Risk  (02/13/2022)   Overall Financial Resource Strain (CARDIA)    Difficulty of Paying Living Expenses: Not hard at all  Food Insecurity: No Food Insecurity (02/13/2022)   Hunger Vital Sign    Worried About Running Out of Food in the Last Year: Never true    Ran Out of Food in the Last Year: Never true  Transportation Needs: No  Transportation Needs (02/13/2022)   PRAPARE - Administrator, Civil Service (Medical): No    Lack of Transportation (Non-Medical): No  Physical Activity: Unknown (02/13/2022)   Exercise Vital Sign    Days of Exercise per Week: 0 days    Minutes of Exercise per Session: Not on file  Stress: No Stress Concern Present (02/13/2022)   [employer] of Occupational Health - Occupational Stress Questionnaire    Feeling of Stress : Only a little  Social Connections: Unknown (02/13/2022)   Social Connection and Isolation Panel [NHANES]    Frequency of Communication with Friends  and Family: More than three times a week    Frequency of Social Gatherings with Friends and Family: Not on file    Attends Religious Services: Not on file    Active Member of Clubs or Organizations: Not on file    Attends Banker Meetings: Not on file    Marital Status: Married    Allergies:  Allergies  Allergen Reactions   Advair Diskus [Fluticasone-Salmeterol] Other (See Comments)    Causes blisters in her mouth   Codeine     Unknown reaction   Pollen Extract Cough    Metabolic Disorder Labs: Lab Results  Component Value Date   HGBA1C 5.6 05/13/2022   No results found for: "PROLACTIN" Lab Results  Component Value Date   CHOL 151 05/13/2022   TRIG 83.0 05/13/2022   HDL 73.00 05/13/2022   CHOLHDL 2 05/13/2022   VLDL 16.6 05/13/2022   LDLCALC 61 05/13/2022   LDLCALC 86 02/10/2022   Lab Results  Component Value Date   TSH 0.93 12/16/2021   TSH 0.95 06/04/2021    Therapeutic Level Labs: No results found for: "LITHIUM" No results found for: "VALPROATE" No results found for: "CBMZ"  Current Medications: Current Outpatient Medications  Medication Sig Dispense Refill   anastrozole (ARIMIDEX) 1 MG tablet Take 1 tablet (1 mg total) by mouth daily. 30 tablet 11   busPIRone (BUSPAR) 10 MG tablet Take 1 tablet (10 mg total) by mouth 3 (three) times daily. 90 tablet 1    citalopram (CELEXA) 40 MG tablet Take 1 tablet (40 mg total) by mouth daily. 90 tablet 0   LORazepam (ATIVAN) 0.5 MG tablet Take 1 tablet (0.5 mg total) by mouth 2 (two) times daily. 60 tablet 1   potassium chloride SA (KLOR-CON M) 20 MEQ tablet Take 1 tablet (20 mEq total) by mouth daily. 14 tablet 0   pravastatin (PRAVACHOL) 20 MG tablet Take 1 tablet (20 mg total) by mouth daily. 30 tablet 5   Tiotropium Bromide-Olodaterol (STIOLTO RESPIMAT) 2.5-2.5 MCG/ACT AERS Inhale 1 puff into the lungs daily.     traZODone (DESYREL) 50 MG tablet Take 0.5-1 tablets (25-50 mg total) by mouth at bedtime as needed for sleep. 30 tablet 1   triamterene-hydrochlorothiazide (MAXZIDE-25) 37.5-25 MG tablet Take 1 tablet by mouth daily. 30 tablet 5   No current facility-administered medications for this visit.     Musculoskeletal: Strength & Muscle Tone: within normal limits Gait & Station:  Slow Patient leans: N/A  Psychiatric Specialty Exam: Review of Systems  Unable to perform ROS: Psychiatric disorder    Blood pressure 108/71, pulse 89, temperature 98.2 F (36.8 C), temperature source Skin, height 5\' 2"  (1.575 m), weight 122 lb 6.4 oz (55.5 kg).Body mass index is 22.39 kg/m.  General Appearance: Fairly Groomed  Eye Contact:  Minimal  Speech:  Clear and Coherent  Volume:  Normal  Mood:  Anxious and Depressed  Affect:  Tearful  Thought Process:  Goal Directed and Descriptions of Associations: Intact  Orientation:  Full (Time, Place, and Person)  Thought Content: Rumination   Suicidal Thoughts:  No  Homicidal Thoughts:  No  Memory:  Immediate;   Fair Recent;   Fair Remote;   Fair  Judgement:  Fair  Insight:  Fair  Psychomotor Activity:  Restlessness  Concentration:  Concentration: Fair and Attention Span: Fair  Recall:  Poor  Fund of Knowledge: Fair  Language: Fair  Akathisia:  No  Handed:  Right  AIMS (if indicated): not done  Assets:  Communication Skills Desire for  Improvement Housing Social Support  ADL's:  Intact  Cognition: WNL  Sleep:   restless   Screenings: GAD-7    Loss adjuster, chartered Office Visit from 06/26/2022 in [facility] Office Visit from 06/10/2022 in [facility] Office Visit from 05/14/2022 in [facility] Office Visit from 04/08/2022 in [facility]  Total GAD-7 Score 21 21 13 2       Mini-Mental    Flowsheet Row Office Visit from 06/26/2022 in [facility] Clinical Support from 06/23/2016 in [facility]  Total Score (max 30 points ) 26 30      PHQ2-9    Flowsheet Row Office Visit from 06/26/2022 in [facility] Office Visit from 06/10/2022 in [facility] Office Visit from 05/14/2022 in [facility] Office Visit from 04/08/2022 in [facility] Clinical Support from 02/13/2022 in [facility] Total Score 6 6 2 1  0  PHQ-9 Total Score 25 23 11 3  --      Flowsheet Row Office Visit from 06/26/2022 in [facility] Office Visit from 06/10/2022 in [facility] ED from 06/07/2022 in [facility]  C-SSRS RISK CATEGORY Low Risk No Risk No Risk        Assessment and Plan: Jennifer [name] is a 77 year old Caucasian female, married, retired, lives in [place], has a history of anxiety, depression, breast cancer currently in remission, presented with anxiety, panic symptoms, sleep problems, will benefit from the following plan.  Plan GAD-unstable Continue Celexa 40 mg p.o. daily for now. Increase  BuSpar to 10 mg p.o. 3 times daily.  However advised to monitor for any worsening dizziness with the dosage change.  Patient to monitor for serotonin syndrome between BuSpar and Celexa. Will consider changing Celexa to another SSRI or SNRI in the future. Will consider increasing lorazepam to 0.5 mg 3 times daily however patient with dizziness, lorazepam could make it worse.  Also habit-forming potential. Reviewed [place] PMP AWARxE   MDD-unstable Increase BuSpar to 10 mg p.o. 3 times daily Celexa 40 mg p.o. daily Trazodone 25 to 50 mg p.o. nightly as needed available for sleep however patient has been noncompliant with this medication.  Could consider using it.  Patient does have sleep issues.  However patient needs to be monitored for dizziness, currently on polypharmacy.  Overuse of medication-benzodiazepine-improving Patient's medications are currently being managed by daughter as well as spouse.  Will continue to monitor closely.  Collateral information obtained from daughter as noted above.  Encouraged to establish care with an individual therapist.  I have also referred this patient to PHP-I have sent an email to [facility] program with [facility]. Also discussed dietary management, supplementing protein shakes, Ensure.  Patient to follow up with primary care provider as needed for dizziness as well as reduced appetite and weight loss.  Follow-up in clinic in 3 to 4 weeks or sooner if needed. Collaboration of Care: Collaboration of Care: Referral or follow-up with counselor/therapist AEB encouraged to establish care with therapist.  As well as communicated with PHP program.  Referral has been sent.  Patient/Guardian was advised Release of Information must be obtained prior to  any record release in order to collaborate their care with an outside provider. Patient/Guardian was advised if they have not already done so to contact the registration department to sign all necessary forms in order  for [place] to release information regarding their care.   Consent: Patient/Guardian gives verbal consent for treatment and assignment of benefits for services provided during this visit. Patient/Guardian expressed understanding and agreed to proceed.   This note was generated in part or whole with voice recognition software. Voice recognition is usually quite accurate but there are transcription errors that can and very often do occur. I apologize for any typographical errors that were not detected and corrected.    Jomarie Longs, MD 06/26/2022, 1:25 PM

## 2022-06-29 ENCOUNTER — Telehealth (HOSPITAL_COMMUNITY): Payer: Self-pay | Admitting: Professional

## 2022-06-30 ENCOUNTER — Telehealth (HOSPITAL_COMMUNITY): Payer: Self-pay | Admitting: Licensed Clinical Social Worker

## 2022-07-01 ENCOUNTER — Encounter: Payer: Self-pay | Admitting: Internal Medicine

## 2022-07-01 ENCOUNTER — Telehealth (HOSPITAL_COMMUNITY): Payer: Self-pay | Admitting: Licensed Clinical Social Worker

## 2022-07-01 DIAGNOSIS — J4520 Mild intermittent asthma, uncomplicated: Secondary | ICD-10-CM

## 2022-07-02 NOTE — Telephone Encounter (Signed)
LMTCB

## 2022-07-02 NOTE — Telephone Encounter (Signed)
Ok to place pulmonary referral for her to [place]?

## 2022-07-02 NOTE — Telephone Encounter (Signed)
Ok to refer to local pulmonary.  I can send in new rx for xopenex if needed. Also, confirm she is no having any acute pulmonary issues now.  If so, needs to be seen

## 2022-07-03 ENCOUNTER — Other Ambulatory Visit (INDEPENDENT_AMBULATORY_CARE_PROVIDER_SITE_OTHER): Payer: Medicare Other

## 2022-07-03 DIAGNOSIS — E876 Hypokalemia: Secondary | ICD-10-CM

## 2022-07-03 LAB — POTASSIUM: Potassium: 3.4 mEq/L — ABNORMAL LOW (ref 3.5–5.1)

## 2022-07-03 MED ORDER — LEVALBUTEROL TARTRATE 45 MCG/ACT IN AERO: 1.0000 | INHALATION_SPRAY | Freq: Four times a day (QID) | RESPIRATORY_TRACT | 2 refills | Status: DC | PRN

## 2022-07-03 MED ORDER — STIOLTO RESPIMAT 2.5-2.5 MCG/ACT IN AERS: 1.0000 | INHALATION_SPRAY | Freq: Every day | RESPIRATORY_TRACT | 2 refills | Status: DC

## 2022-07-03 NOTE — Telephone Encounter (Signed)
Late entry: patient came into office today for potassium lab. After having labs done she was asking about her stiolto and xopenex inhaler as well as pulmonary referral. RN supervisor came and asked about patient. Pulmonary referral has been made and inhalers have been sent in. Patient is aware. Patient was also complaining of sob and anxiety. Upon assessment of patient, breathing was at baseline for her. No acute distress noted. Patient sees psychiatry and they have been adjusting her meds trying to get anxiety under control. Advised to contact psychiatry if needed. Has followup with Dr Elna Breslow 7/5. Patient is coming to see Dr Lorin Picket 6/20 at 11:30.

## 2022-07-06 ENCOUNTER — Telehealth: Payer: Self-pay

## 2022-07-06 NOTE — Telephone Encounter (Signed)
-----   Message from Dale Escalon, MD sent at 07/06/2022  4:02 AM EDT ----- Notify - potassium is improving. Please confirm (with husband) and document - dose of potassium she is currently taking.

## 2022-07-07 ENCOUNTER — Ambulatory Visit: Payer: Medicare Other | Admitting: Nurse Practitioner

## 2022-07-08 ENCOUNTER — Other Ambulatory Visit: Payer: Self-pay

## 2022-07-08 ENCOUNTER — Emergency Department (HOSPITAL_COMMUNITY): Payer: Medicare Other

## 2022-07-08 ENCOUNTER — Emergency Department (HOSPITAL_COMMUNITY)
Admission: EM | Admit: 2022-07-08 | Discharge: 2022-07-08 | Disposition: A | Discharge status: Home / Self Care | Payer: Medicare Other | Attending: Emergency Medicine | Admitting: Emergency Medicine

## 2022-07-08 ENCOUNTER — Encounter (HOSPITAL_COMMUNITY): Payer: Self-pay | Admitting: *Deleted

## 2022-07-08 DIAGNOSIS — Z1152 Encounter for screening for COVID-19: Secondary | ICD-10-CM | POA: Diagnosis not present

## 2022-07-08 DIAGNOSIS — R0602 Shortness of breath: Secondary | ICD-10-CM | POA: Diagnosis not present

## 2022-07-08 DIAGNOSIS — R0600 Dyspnea, unspecified: Secondary | ICD-10-CM | POA: Diagnosis not present

## 2022-07-08 LAB — CBC WITH DIFFERENTIAL/PLATELET
Abs Immature Granulocytes: 0.03 10*3/uL (ref 0.00–0.07)
Basophils Absolute: 0.1 10*3/uL (ref 0.0–0.1)
Basophils Relative: 1 %
Eosinophils Absolute: 0.3 10*3/uL (ref 0.0–0.5)
Eosinophils Relative: 3 %
HCT: 38.4 % (ref 36.0–46.0)
Hemoglobin: 12.7 g/dL (ref 12.0–15.0)
Immature Granulocytes: 0 %
Lymphocytes Relative: 25 %
Lymphs Abs: 2.3 10*3/uL (ref 0.7–4.0)
MCH: 32.3 pg (ref 26.0–34.0)
MCHC: 33.1 g/dL (ref 30.0–36.0)
MCV: 97.7 fL (ref 80.0–100.0)
Monocytes Absolute: 0.7 10*3/uL (ref 0.1–1.0)
Monocytes Relative: 8 %
Neutro Abs: 5.9 10*3/uL (ref 1.7–7.7)
Neutrophils Relative %: 63 %
Platelets: 226 10*3/uL (ref 150–400)
RBC: 3.93 MIL/uL (ref 3.87–5.11)
RDW: 12 % (ref 11.5–15.5)
WBC: 9.3 10*3/uL (ref 4.0–10.5)
nRBC: 0 % (ref 0.0–0.2)

## 2022-07-08 LAB — RESP PANEL BY RT-PCR (RSV, FLU A&B, COVID)  RVPGX2
Influenza A by PCR: NEGATIVE
Influenza B by PCR: NEGATIVE
Resp Syncytial Virus by PCR: NEGATIVE
SARS Coronavirus 2 by RT PCR: NEGATIVE

## 2022-07-08 LAB — BASIC METABOLIC PANEL
Anion gap: 13 (ref 5–15)
BUN: 10 mg/dL (ref 8–23)
CO2: 27 mmol/L (ref 22–32)
Calcium: 9.8 mg/dL (ref 8.9–10.3)
Chloride: 97 mmol/L — ABNORMAL LOW (ref 98–111)
Creatinine, Ser: 1.3 mg/dL — ABNORMAL HIGH (ref 0.44–1.00)
GFR, Estimated: 43 mL/min — ABNORMAL LOW (ref >60)
Glucose, Bld: 96 mg/dL (ref 70–99)
Potassium: 4.2 mmol/L (ref 3.5–5.1)
Sodium: 137 mmol/L (ref 135–145)

## 2022-07-08 LAB — BRAIN NATRIURETIC PEPTIDE: B Natriuretic Peptide: 47 pg/mL (ref 0.0–100.0)

## 2022-07-08 LAB — TROPONIN I (HIGH SENSITIVITY)
Troponin I (High Sensitivity): 7 ng/L (ref <18)
Troponin I (High Sensitivity): 6 ng/L (ref <18)

## 2022-07-08 LAB — D-DIMER, QUANTITATIVE: D-Dimer, Quant: 0.5 ug/mL-FEU (ref 0.00–0.50)

## 2022-07-08 LAB — MAGNESIUM: Magnesium: 1.8 mg/dL (ref 1.7–2.4)

## 2022-07-08 MED ORDER — LORAZEPAM 1 MG PO TABS
1.0000 mg | ORAL_TABLET | Freq: Once | ORAL | Status: AC
  Administered 2022-07-08: 1 mg via ORAL
  Filled 2022-07-08: qty 1

## 2022-07-08 NOTE — ED Notes (Signed)
Patient upset saying she is scared and wanting to go home. Patients husband is at bedside trying to console her. EDP aware.

## 2022-07-08 NOTE — ED Triage Notes (Signed)
The pt is c/o shortness of breath for a long time  she reports that she is on 02 at home   no pain she is upset because she is afraid

## 2022-07-08 NOTE — Telephone Encounter (Signed)
Discussed with daughter. Decided to keep 6/26 appt. Has pulmonary appt scheduled and also f/u with Dr Elna Breslow

## 2022-07-08 NOTE — ED Provider Triage Note (Signed)
Emergency Medicine Provider Triage Evaluation Note  JENAYAH SONNEK , a 77 y.o. female  was evaluated in triage.  Pt complains of shortness of breath.  Denies chest pain.  States this is a longstanding issue.  She is concerned about her anxiety level.  States she stopped seeing her pulmonologist.  Review of Systems  Positive: As above Negative: As above  Physical Exam  BP 102/67 (BP Location: Right Arm)   Pulse 75   Temp 98.1 F (36.7 C) (Oral)   Resp 20   Ht 5\' 2"  (1.575 m)   Wt 55.5 kg   SpO2 100%   BMI 22.38 kg/m  Gen:   Awake, no distress   Resp:  Normal effort  MSK:   Moves extremities without difficulty Other:    Medical Decision Making  Medically screening exam initiated at 4:02 PM.  Appropriate orders placed.  [name] was informed that the remainder of the evaluation will be completed by another provider, this initial triage assessment does not replace that evaluation, and the importance of remaining in the ED until their evaluation is complete.     Marita [place], PA-C 07/08/22 [phone]

## 2022-07-08 NOTE — ED Notes (Signed)
Patient resting in bed comfortably, no distress noted. She states the medication has helped her feel calmer.

## 2022-07-08 NOTE — ED Provider Notes (Signed)
[place] EMERGENCY DEPARTMENT AT [facility] Provider Note   CSN: [id number] Arrival date & time: 07/08/22  1532     History  Chief Complaint  Patient presents with   Shortness of Breath    Jennifer [name] is a 77 y.o. female.  77 yo F with a chief complaint of difficulty breathing.  Going on for weeks.  She has episodes where she feels like she cannot breathe manage comes overly panicked.  Has been crying a lot.  Has a relatively new psychiatrist and has changed her medications recently.  She denies cough congestion or fever.  Denies leg swelling.  Denies history of PE or DVT.  Denies history of MI.  Denies chest pain or pressure.  She denies exertional symptoms but does not feel like she is very active.   Shortness of Breath      Home Medications Prior to Admission medications   Medication Sig Start Date End Date Taking? Authorizing Provider  anastrozole (ARIMIDEX) 1 MG tablet Take 1 tablet (1 mg total) by mouth daily. 03/23/22   Causey, Larna Daughters, NP  busPIRone (BUSPAR) 10 MG tablet Take 1 tablet (10 mg total) by mouth 3 (three) times daily. 06/26/22   Jomarie Longs, MD  citalopram (CELEXA) 40 MG tablet Take 1 tablet (40 mg total) by mouth daily. 04/08/22   Dale [place], MD  levalbuterol [facility] HFA) 45 MCG/ACT inhaler Inhale 1-2 puffs into the lungs every 6 (six) hours as needed for wheezing or shortness of breath. 07/03/22   Dale [place], MD  LORazepam (ATIVAN) 0.5 MG tablet Take 1 tablet (0.5 mg total) by mouth 2 (two) times daily. 06/10/22   Jomarie Longs, MD  potassium chloride SA (KLOR-CON M) 20 MEQ tablet Take 1 tablet (20 mEq total) by mouth daily. 06/07/22   Jacalyn Lefevre, MD  pravastatin (PRAVACHOL) 20 MG tablet Take 1 tablet (20 mg total) by mouth daily. 05/13/22   Dale [place], MD  Tiotropium Bromide-Olodaterol (STIOLTO RESPIMAT) 2.5-2.5 MCG/ACT AERS Inhale 1 puff into the lungs daily. 07/03/22   Dale [place], MD  traZODone (DESYREL) 50 MG  tablet Take 0.5-1 tablets (25-50 mg total) by mouth at bedtime as needed for sleep. 04/08/22   Jomarie Longs, MD  triamterene-hydrochlorothiazide (MAXZIDE-25) 37.5-25 MG tablet Take 1 tablet by mouth daily. 05/13/22   Dale [place], MD      Allergies    Advair diskus [fluticasone-salmeterol], Codeine, and Pollen extract    Review of Systems   Review of Systems  Respiratory:  Positive for shortness of breath.     Physical Exam Updated Vital Signs BP 104/73   Pulse 75   Temp 98.3 F (36.8 C) (Oral)   Resp 17   Ht 5\' 2"  (1.575 m)   Wt 55.5 kg   SpO2 99%   BMI 22.38 kg/m  Physical Exam Vitals and nursing note reviewed.  Constitutional:      General: She is not in acute distress.    Appearance: She is well-developed. She is not diaphoretic.  HENT:     Head: Normocephalic and atraumatic.  Eyes:     Pupils: Pupils are equal, round, and reactive to light.  Cardiovascular:     Rate and Rhythm: Normal rate and regular rhythm.     Heart sounds: No murmur heard.    No friction rub. No gallop.  Pulmonary:     Effort: Pulmonary effort is normal.     Breath sounds: No wheezing or rales.  Abdominal:  General: There is no distension.     Palpations: Abdomen is soft.     Tenderness: There is no abdominal tenderness.  Musculoskeletal:        General: No tenderness.     Cervical back: Normal range of motion and neck supple.  Skin:    General: Skin is warm and dry.  Neurological:     Mental Status: She is alert and oriented to person, place, and time.  Psychiatric:        Behavior: Behavior normal.     ED Results / Procedures / Treatments   Labs (all labs ordered are listed, but only abnormal results are displayed) Labs Reviewed  BASIC METABOLIC PANEL - Abnormal; Notable for the following components:      Result Value   Chloride 97 (*)    Creatinine, Ser 1.30 (*)    GFR, Estimated 43 (*)    All other components within normal limits  RESP PANEL BY RT-PCR (RSV, FLU  A&B, COVID)  RVPGX2  BRAIN NATRIURETIC PEPTIDE  CBC WITH DIFFERENTIAL/PLATELET  MAGNESIUM  D-DIMER, QUANTITATIVE  TROPONIN I (HIGH SENSITIVITY)  TROPONIN I (HIGH SENSITIVITY)    EKG EKG Interpretation  Date/Time:  Wednesday July 08 2022 15:44:10 EDT Ventricular Rate:  72 PR Interval:  120 QRS Duration: 70 QT Interval:  382 QTC Calculation: 418 R Axis:   36 Text Interpretation: Normal sinus rhythm Normal ECG No significant change since last tracing Confirmed by Melene Plan [phone]) on 07/08/2022 6:54:47 PM  Radiology DG Chest 2 View  Result Date: 07/08/2022 CLINICAL DATA:  Dyspnea. EXAM: CHEST - 2 VIEW COMPARISON:  Jun 07, 2022. FINDINGS: The heart size and mediastinal contours are within normal limits. Both lungs are clear. Stable severe S-shaped thoracolumbar scoliosis. IMPRESSION: Stable severe thoracolumbar scoliosis.  No acute abnormality seen. Electronically Signed   By: Lupita Raider M.D.   On: 07/08/2022 16:44    Procedures Procedures    Medications Ordered in ED Medications  LORazepam (ATIVAN) tablet 1 mg (1 mg Oral Given 07/08/22 1936)    ED Course/ Medical Decision Making/ A&P                             Medical Decision Making Amount and/or Complexity of Data Reviewed Labs: ordered.  Risk Prescription drug management.   77 yo F with a chief complaint of difficulty breathing.  She has lung disease and has oxygen as needed at home.  Has been using it much more often recently.  She has episodes where she feels very panicked and feels like she is having a lot of trouble breathing.  No obvious finding on initial lab work and chest x-ray.  No anemia no significant electrolyte abnormalities chest x-ray independently interpreted by me without focal infiltrate or pneumothorax.  Troponin negative.  Will obtain a second troponin.  D-dimer.  D-dimer negative, troponin negative x 2.  COVID RSV and flu negative.  Will have the patient follow-up with their family doctor in  the office.  9:29 PM:  I have discussed the diagnosis/risks/treatment options with the patient.  Evaluation and diagnostic testing in the emergency department does not suggest an emergent condition requiring admission or immediate intervention beyond what has been performed at this time.  They will follow up with PCP. We also discussed returning to the ED immediately if new or worsening sx occur. We discussed the sx which are most concerning (e.g., sudden worsening pain, fever, inability to  tolerate by mouth) that necessitate immediate return. Medications administered to the patient during their visit and any new prescriptions provided to the patient are listed below.  Medications given during this visit Medications  LORazepam (ATIVAN) tablet 1 mg (1 mg Oral Given 07/08/22 1936)     The patient appears reasonably screen and/or stabilized for discharge and I doubt any other medical condition or other [facility] requiring further screening, evaluation, or treatment in the ED at this time prior to discharge.          Final Clinical Impression(s) / ED Diagnoses Final diagnoses:  Shortness of breath    Rx / DC Orders ED Discharge Orders     None         Melene Plan, DO 07/08/22 2129

## 2022-07-08 NOTE — Discharge Instructions (Signed)
Follow up with your family doc in the office.  Please return for worsening symptoms.

## 2022-07-09 ENCOUNTER — Ambulatory Visit: Payer: Medicare Other | Admitting: Internal Medicine

## 2022-07-10 ENCOUNTER — Other Ambulatory Visit: Payer: Self-pay

## 2022-07-10 DIAGNOSIS — E876 Hypokalemia: Secondary | ICD-10-CM

## 2022-07-13 ENCOUNTER — Ambulatory Visit: Payer: BC Managed Care – PPO | Admitting: Psychiatry

## 2022-07-15 ENCOUNTER — Inpatient Hospital Stay
Admission: AD | Admit: 2022-07-15 | Discharge: 2022-07-20 | DRG: 880 | Disposition: A | Discharge status: Home / Self Care | Payer: Medicare Other | Source: Intra-hospital | Attending: Psychiatry | Admitting: Psychiatry

## 2022-07-15 ENCOUNTER — Encounter: Payer: Self-pay | Admitting: Internal Medicine

## 2022-07-15 ENCOUNTER — Telehealth: Payer: Self-pay | Admitting: Child and Adolescent Psychiatry

## 2022-07-15 ENCOUNTER — Encounter: Payer: Self-pay | Admitting: Family

## 2022-07-15 ENCOUNTER — Telehealth: Payer: Self-pay

## 2022-07-15 ENCOUNTER — Ambulatory Visit (INDEPENDENT_AMBULATORY_CARE_PROVIDER_SITE_OTHER): Payer: Medicare Other | Admitting: Internal Medicine

## 2022-07-15 ENCOUNTER — Ambulatory Visit (HOSPITAL_COMMUNITY)
Admission: EM | Admit: 2022-07-15 | Discharge: 2022-07-15 | Disposition: A | Discharge status: Other Acute Inpatient Hospital | Payer: Medicare Other | Attending: Family | Admitting: Family

## 2022-07-15 ENCOUNTER — Other Ambulatory Visit: Payer: Self-pay

## 2022-07-15 VITALS — BP 122/72 | HR 101 | Temp 98.0°F | Resp 16 | Ht 62.0 in | Wt 119.4 lb

## 2022-07-15 DIAGNOSIS — R0602 Shortness of breath: Secondary | ICD-10-CM

## 2022-07-15 DIAGNOSIS — Z9049 Acquired absence of other specified parts of digestive tract: Secondary | ICD-10-CM | POA: Diagnosis not present

## 2022-07-15 DIAGNOSIS — Z9109 Other allergy status, other than to drugs and biological substances: Secondary | ICD-10-CM | POA: Diagnosis not present

## 2022-07-15 DIAGNOSIS — F410 Panic disorder [episodic paroxysmal anxiety] without agoraphobia: Secondary | ICD-10-CM | POA: Diagnosis not present

## 2022-07-15 DIAGNOSIS — K2100 Gastro-esophageal reflux disease with esophagitis, without bleeding: Secondary | ICD-10-CM | POA: Diagnosis not present

## 2022-07-15 DIAGNOSIS — Z9851 Tubal ligation status: Secondary | ICD-10-CM

## 2022-07-15 DIAGNOSIS — F32A Depression, unspecified: Secondary | ICD-10-CM | POA: Insufficient documentation

## 2022-07-15 DIAGNOSIS — I10 Essential (primary) hypertension: Secondary | ICD-10-CM | POA: Diagnosis present

## 2022-07-15 DIAGNOSIS — J4489 Other specified chronic obstructive pulmonary disease: Secondary | ICD-10-CM | POA: Diagnosis present

## 2022-07-15 DIAGNOSIS — M1990 Unspecified osteoarthritis, unspecified site: Secondary | ICD-10-CM | POA: Diagnosis present

## 2022-07-15 DIAGNOSIS — J449 Chronic obstructive pulmonary disease, unspecified: Secondary | ICD-10-CM | POA: Diagnosis not present

## 2022-07-15 DIAGNOSIS — Z981 Arthrodesis status: Secondary | ICD-10-CM | POA: Diagnosis not present

## 2022-07-15 DIAGNOSIS — Z885 Allergy status to narcotic agent status: Secondary | ICD-10-CM

## 2022-07-15 DIAGNOSIS — Z79899 Other long term (current) drug therapy: Secondary | ICD-10-CM

## 2022-07-15 DIAGNOSIS — Z888 Allergy status to other drugs, medicaments and biological substances status: Secondary | ICD-10-CM

## 2022-07-15 DIAGNOSIS — Z79811 Long term (current) use of aromatase inhibitors: Secondary | ICD-10-CM | POA: Diagnosis not present

## 2022-07-15 DIAGNOSIS — R634 Abnormal weight loss: Secondary | ICD-10-CM | POA: Insufficient documentation

## 2022-07-15 DIAGNOSIS — Z170 Estrogen receptor positive status [ER+]: Secondary | ICD-10-CM | POA: Diagnosis not present

## 2022-07-15 DIAGNOSIS — M419 Scoliosis, unspecified: Secondary | ICD-10-CM | POA: Diagnosis present

## 2022-07-15 DIAGNOSIS — F419 Anxiety disorder, unspecified: Secondary | ICD-10-CM | POA: Diagnosis not present

## 2022-07-15 DIAGNOSIS — R630 Anorexia: Secondary | ICD-10-CM

## 2022-07-15 DIAGNOSIS — C50411 Malignant neoplasm of upper-outer quadrant of right female breast: Secondary | ICD-10-CM

## 2022-07-15 DIAGNOSIS — Z923 Personal history of irradiation: Secondary | ICD-10-CM | POA: Diagnosis not present

## 2022-07-15 DIAGNOSIS — Z853 Personal history of malignant neoplasm of breast: Secondary | ICD-10-CM

## 2022-07-15 DIAGNOSIS — E7800 Pure hypercholesterolemia, unspecified: Secondary | ICD-10-CM

## 2022-07-15 DIAGNOSIS — R739 Hyperglycemia, unspecified: Secondary | ICD-10-CM | POA: Diagnosis not present

## 2022-07-15 DIAGNOSIS — Z90710 Acquired absence of both cervix and uterus: Secondary | ICD-10-CM | POA: Diagnosis not present

## 2022-07-15 DIAGNOSIS — J4520 Mild intermittent asthma, uncomplicated: Secondary | ICD-10-CM

## 2022-07-15 DIAGNOSIS — F411 Generalized anxiety disorder: Secondary | ICD-10-CM | POA: Diagnosis not present

## 2022-07-15 DIAGNOSIS — K219 Gastro-esophageal reflux disease without esophagitis: Secondary | ICD-10-CM | POA: Diagnosis present

## 2022-07-15 LAB — CBC WITH DIFFERENTIAL/PLATELET
Abs Immature Granulocytes: 0.03 10*3/uL (ref 0.00–0.07)
Basophils Absolute: 0.1 10*3/uL (ref 0.0–0.1)
Basophils Relative: 1 %
Eosinophils Absolute: 0.3 10*3/uL (ref 0.0–0.5)
Eosinophils Relative: 3 %
HCT: 35.7 % — ABNORMAL LOW (ref 36.0–46.0)
Hemoglobin: 12 g/dL (ref 12.0–15.0)
Immature Granulocytes: 0 %
Lymphocytes Relative: 21 %
Lymphs Abs: 2.1 10*3/uL (ref 0.7–4.0)
MCH: 31.7 pg (ref 26.0–34.0)
MCHC: 33.6 g/dL (ref 30.0–36.0)
MCV: 94.4 fL (ref 80.0–100.0)
Monocytes Absolute: 1 10*3/uL (ref 0.1–1.0)
Monocytes Relative: 9 %
Neutro Abs: 6.9 10*3/uL (ref 1.7–7.7)
Neutrophils Relative %: 66 %
Platelets: 244 10*3/uL (ref 150–400)
RBC: 3.78 MIL/uL — ABNORMAL LOW (ref 3.87–5.11)
RDW: 12.6 % (ref 11.5–15.5)
WBC: 10.3 10*3/uL (ref 4.0–10.5)
nRBC: 0 % (ref 0.0–0.2)

## 2022-07-15 LAB — URINALYSIS, COMPLETE (UACMP) WITH MICROSCOPIC
Bilirubin Urine: NEGATIVE
Glucose, UA: NEGATIVE mg/dL
Ketones, ur: NEGATIVE mg/dL
Nitrite: NEGATIVE
Protein, ur: NEGATIVE mg/dL
Specific Gravity, Urine: 1.008 (ref 1.005–1.030)
pH: 7 (ref 5.0–8.0)

## 2022-07-15 LAB — COMPREHENSIVE METABOLIC PANEL
ALT: 16 U/L (ref 0–44)
AST: 31 U/L (ref 15–41)
Albumin: 4.4 g/dL (ref 3.5–5.0)
Alkaline Phosphatase: 46 U/L (ref 38–126)
Anion gap: 11 (ref 5–15)
BUN: 12 mg/dL (ref 8–23)
CO2: 27 mmol/L (ref 22–32)
Calcium: 9.7 mg/dL (ref 8.9–10.3)
Chloride: 97 mmol/L — ABNORMAL LOW (ref 98–111)
Creatinine, Ser: 1.45 mg/dL — ABNORMAL HIGH (ref 0.44–1.00)
GFR, Estimated: 38 mL/min — ABNORMAL LOW (ref >60)
Glucose, Bld: 87 mg/dL (ref 70–99)
Potassium: 3.4 mmol/L — ABNORMAL LOW (ref 3.5–5.1)
Sodium: 135 mmol/L (ref 135–145)
Total Bilirubin: 1 mg/dL (ref 0.3–1.2)
Total Protein: 6.8 g/dL (ref 6.5–8.1)

## 2022-07-15 LAB — POCT URINE DRUG SCREEN - MANUAL ENTRY (I-SCREEN)
POC Amphetamine UR: NOT DETECTED
POC Buprenorphine (BUP): NOT DETECTED
POC Cocaine UR: NOT DETECTED
POC Marijuana UR: POSITIVE — AB
POC Methadone UR: NOT DETECTED
POC Methamphetamine UR: NOT DETECTED
POC Morphine: NOT DETECTED
POC Oxazepam (BZO): POSITIVE — AB
POC Oxycodone UR: NOT DETECTED
POC Secobarbital (BAR): NOT DETECTED

## 2022-07-15 LAB — TSH: TSH: 0.798 u[IU]/mL (ref 0.350–4.500)

## 2022-07-15 LAB — MAGNESIUM: Magnesium: 2 mg/dL (ref 1.7–2.4)

## 2022-07-15 LAB — ETHANOL: Alcohol, Ethyl (B): <10 mg/dL (ref <10)

## 2022-07-15 MED ORDER — HALOPERIDOL LACTATE 5 MG/ML IJ SOLN: 5.0000 mg | Freq: Three times a day (TID) | INTRAMUSCULAR | Status: DC | PRN

## 2022-07-15 MED ORDER — CLONAZEPAM 0.5 MG PO TABS: 0.5000 mg | ORAL_TABLET | Freq: Two times a day (BID) | ORAL | Status: DC

## 2022-07-15 MED ORDER — MAGNESIUM HYDROXIDE 400 MG/5ML PO SUSP: 30.0000 mL | Freq: Every day | ORAL | Status: DC | PRN

## 2022-07-15 MED ORDER — PRAVASTATIN SODIUM 40 MG PO TABS
20.0000 mg | ORAL_TABLET | Freq: Every day | ORAL | Status: DC
  Administered 2022-07-16 – 2022-07-20 (×5): 20 mg via ORAL
  Filled 2022-07-15 (×5): qty 1

## 2022-07-15 MED ORDER — LORAZEPAM 2 MG/ML IJ SOLN: 2.0000 mg | Freq: Three times a day (TID) | INTRAMUSCULAR | Status: DC | PRN

## 2022-07-15 MED ORDER — ENSURE ENLIVE PO LIQD
237.0000 mL | Freq: Two times a day (BID) | ORAL | Status: DC
  Administered 2022-07-16 – 2022-07-19 (×4): 237 mL via ORAL

## 2022-07-15 MED ORDER — ACETAMINOPHEN 325 MG PO TABS
650.0000 mg | ORAL_TABLET | Freq: Four times a day (QID) | ORAL | Status: DC | PRN
  Administered 2022-07-17 – 2022-07-19 (×2): 650 mg via ORAL
  Filled 2022-07-15 (×2): qty 2

## 2022-07-15 MED ORDER — ARFORMOTEROL TARTRATE 15 MCG/2ML IN NEBU
15.0000 ug | INHALATION_SOLUTION | Freq: Two times a day (BID) | RESPIRATORY_TRACT | Status: DC
  Filled 2022-07-15: qty 2

## 2022-07-15 MED ORDER — ARFORMOTEROL TARTRATE 15 MCG/2ML IN NEBU
15.0000 ug | INHALATION_SOLUTION | Freq: Two times a day (BID) | RESPIRATORY_TRACT | Status: DC
  Administered 2022-07-16 – 2022-07-20 (×8): 15 ug via RESPIRATORY_TRACT
  Filled 2022-07-15 (×10): qty 2

## 2022-07-15 MED ORDER — TRAZODONE HCL 50 MG PO TABS
25.0000 mg | ORAL_TABLET | Freq: Every evening | ORAL | Status: DC | PRN
  Administered 2022-07-15 – 2022-07-19 (×3): 50 mg via ORAL
  Filled 2022-07-15 (×4): qty 1

## 2022-07-15 MED ORDER — LORAZEPAM 1 MG PO TABS
2.0000 mg | ORAL_TABLET | Freq: Three times a day (TID) | ORAL | Status: DC | PRN
  Administered 2022-07-16: 2 mg via ORAL
  Filled 2022-07-15: qty 2

## 2022-07-15 MED ORDER — QUETIAPINE FUMARATE 50 MG PO TABS
50.0000 mg | ORAL_TABLET | Freq: Every day | ORAL | Status: DC
  Administered 2022-07-15: 50 mg via ORAL
  Filled 2022-07-15: qty 1

## 2022-07-15 MED ORDER — DIPHENHYDRAMINE HCL 50 MG/ML IJ SOLN: 50.0000 mg | Freq: Three times a day (TID) | INTRAMUSCULAR | Status: DC | PRN

## 2022-07-15 MED ORDER — PRAVASTATIN SODIUM 10 MG PO TABS: 20.0000 mg | ORAL_TABLET | Freq: Every day | ORAL | Status: DC

## 2022-07-15 MED ORDER — CITALOPRAM HYDROBROMIDE 20 MG PO TABS
40.0000 mg | ORAL_TABLET | Freq: Every day | ORAL | Status: DC
  Administered 2022-07-16 – 2022-07-20 (×5): 40 mg via ORAL
  Filled 2022-07-15 (×6): qty 2

## 2022-07-15 MED ORDER — CLONAZEPAM 0.5 MG PO TABS
0.5000 mg | ORAL_TABLET | Freq: Two times a day (BID) | ORAL | Status: DC
  Administered 2022-07-15 – 2022-07-20 (×10): 0.5 mg via ORAL
  Filled 2022-07-15 (×10): qty 1

## 2022-07-15 MED ORDER — TRIAMTERENE-HCTZ 37.5-25 MG PO TABS: 1.0000 | ORAL_TABLET | Freq: Every day | ORAL | Status: DC

## 2022-07-15 MED ORDER — POTASSIUM CHLORIDE CRYS ER 20 MEQ PO TBCR
20.0000 meq | EXTENDED_RELEASE_TABLET | Freq: Every day | ORAL | Status: DC
  Administered 2022-07-16 – 2022-07-20 (×5): 20 meq via ORAL
  Filled 2022-07-15 (×5): qty 1

## 2022-07-15 MED ORDER — ACETAMINOPHEN 325 MG PO TABS: 650.0000 mg | ORAL_TABLET | Freq: Four times a day (QID) | ORAL | Status: DC | PRN

## 2022-07-15 MED ORDER — UMECLIDINIUM BROMIDE 62.5 MCG/ACT IN AEPB
1.0000 | INHALATION_SPRAY | Freq: Every day | RESPIRATORY_TRACT | Status: DC
  Administered 2022-07-16 – 2022-07-20 (×5): 1 via RESPIRATORY_TRACT
  Filled 2022-07-15 (×2): qty 7

## 2022-07-15 MED ORDER — QUETIAPINE FUMARATE 25 MG PO TABS
50.0000 mg | ORAL_TABLET | Freq: Every day | ORAL | Status: DC
  Administered 2022-07-16 – 2022-07-19 (×4): 50 mg via ORAL
  Filled 2022-07-15 (×4): qty 2

## 2022-07-15 MED ORDER — ALUM & MAG HYDROXIDE-SIMETH 200-200-20 MG/5ML PO SUSP: 30.0000 mL | ORAL | Status: DC | PRN

## 2022-07-15 MED ORDER — UMECLIDINIUM BROMIDE 62.5 MCG/ACT IN AEPB
1.0000 | INHALATION_SPRAY | Freq: Every day | RESPIRATORY_TRACT | Status: DC
  Administered 2022-07-15: 1 via RESPIRATORY_TRACT
  Filled 2022-07-15: qty 7

## 2022-07-15 MED ORDER — CLONAZEPAM 0.5 MG PO TABS
0.5000 mg | ORAL_TABLET | Freq: Once | ORAL | Status: AC
  Administered 2022-07-15: 0.5 mg via ORAL
  Filled 2022-07-15: qty 1

## 2022-07-15 MED ORDER — CITALOPRAM HYDROBROMIDE 20 MG PO TABS: 40.0000 mg | ORAL_TABLET | Freq: Every day | ORAL | Status: DC

## 2022-07-15 MED ORDER — TRIAMTERENE-HCTZ 37.5-25 MG PO TABS
1.0000 | ORAL_TABLET | Freq: Every day | ORAL | Status: DC
  Administered 2022-07-17 – 2022-07-19 (×3): 1 via ORAL
  Filled 2022-07-15 (×5): qty 1

## 2022-07-15 MED ORDER — HALOPERIDOL 5 MG PO TABS
5.0000 mg | ORAL_TABLET | Freq: Three times a day (TID) | ORAL | Status: DC | PRN
  Administered 2022-07-16: 5 mg via ORAL
  Filled 2022-07-15: qty 1

## 2022-07-15 MED ORDER — BUSPIRONE HCL 5 MG PO TABS
10.0000 mg | ORAL_TABLET | Freq: Three times a day (TID) | ORAL | Status: DC
  Administered 2022-07-16 – 2022-07-20 (×13): 10 mg via ORAL
  Filled 2022-07-15 (×13): qty 2

## 2022-07-15 MED ORDER — LEVALBUTEROL TARTRATE 45 MCG/ACT IN AERO: 1.0000 | INHALATION_SPRAY | Freq: Four times a day (QID) | RESPIRATORY_TRACT | Status: DC | PRN

## 2022-07-15 MED ORDER — BUSPIRONE HCL 10 MG PO TABS: 10.0000 mg | ORAL_TABLET | Freq: Three times a day (TID) | ORAL | Status: DC

## 2022-07-15 MED ORDER — LEVALBUTEROL TARTRATE 45 MCG/ACT IN AERO
1.0000 | INHALATION_SPRAY | Freq: Four times a day (QID) | RESPIRATORY_TRACT | Status: DC | PRN
  Administered 2022-07-16 – 2022-07-17 (×2): 2 via RESPIRATORY_TRACT
  Administered 2022-07-18: 1 via RESPIRATORY_TRACT
  Filled 2022-07-15 (×2): qty 15

## 2022-07-15 MED ORDER — POTASSIUM CHLORIDE CRYS ER 20 MEQ PO TBCR: 20.0000 meq | EXTENDED_RELEASE_TABLET | Freq: Every day | ORAL | Status: DC

## 2022-07-15 MED ORDER — DIPHENHYDRAMINE HCL 25 MG PO CAPS
50.0000 mg | ORAL_CAPSULE | Freq: Three times a day (TID) | ORAL | Status: DC | PRN
  Administered 2022-07-16: 50 mg via ORAL
  Filled 2022-07-15: qty 2

## 2022-07-15 NOTE — ED Notes (Signed)
Pt is anxious about her going to a facility but I was explained to her about her going to [facility] she is now sitting on bed calm and cooperative no c/o pain or distress noted alert and orient x 4 will continue to monitor for safety

## 2022-07-15 NOTE — Telephone Encounter (Signed)
LM for psychiatry to return my call.

## 2022-07-15 NOTE — Telephone Encounter (Signed)
Dr.Charlene Lorin Picket [phone]) from [facility] office called stating patient is having anxiety attacks and wanted to speak with the covering provider. Patient's next appointment is 7/5-Please Advise

## 2022-07-15 NOTE — ED Notes (Signed)
Patient brought on the unit, tearful and crying, patient is nervous due to never being away from her husband of 55 years. She had Klonopin earlier and NP aware she is still anxious. Skin assessment unremarkable. Oriented pt to unit

## 2022-07-15 NOTE — Telephone Encounter (Signed)
Transition Care Management Unsuccessful Follow-up Telephone Call  Date of discharge and from where:  [facility] 6/[facility]  Attempts:  1st Attempt  Reason for unsuccessful TCM follow-up call:  Unable to leave message   Lenard Forth [facility] Guide, [facility] [phone] [address] [place], [place], [place] [address] Phone: [phone] Email: [email or web address]

## 2022-07-15 NOTE — Progress Notes (Signed)
Subjective:    [name], female    DOB: 1945/09/14, 77 y.o.   MRN: [id number]  Patient here for  Chief Complaint  Patient presents with   Medical Management of Chronic Issues    HPI Here for work in appt.  She is accompanied by her husband and daughter.  History obtained from all of them. Was seen ER - 07/08/22 - difficulty breathing. Had cxr - unrevealing.  Labs (including troponin) unrevealing.  D dimer negative. Covid, RSV and flu negative.  Has had issues with increased anxiety for years.  Recently has had worsening issues with increased anxiety, especially worse over the last 1-2 weeks.  She is very anxious today.  Not able to sit still.  Crying.  Family reports she is sleeping.  Not taking trazodone.  Feel doesn't need.  No increased cough or congestion.  Has felt more sob, but appears to be more related to her anxiety.  Family concerned.  Feels needs more intensive therapy.     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 Antony Blackbird   Hypertension    PONV (postoperative nausea and vomiting)    Scoliosis    Ulcer    Past Surgical History:  Procedure Laterality Date   ABDOMINAL HYSTERECTOMY     ANTERIOR CERVICAL DECOMP/DISCECTOMY FUSION N/A 04/19/2017   Procedure: Anterior Cervical Decompression Fusion Cervical Three-Four, Cervical Four-Five, Cervical Five-Six;  Surgeon: Donalee Citrin, MD;  Location: [facility] OR;  Service: Neurosurgery;  Laterality: N/A;  Anterior Cervical Decompression Fusion Cervical Three-Four, Cervical Four-Five, Cervical Five-Six   BREAST BIOPSY Right 11/14/2020   [place] Bx, Venus Clip, Invasive Mammary Carcinoma   BREAST LUMPECTOMY Right 2022    With radiation   BREAST LUMPECTOMY WITH RADIOACTIVE SEED AND SENTINEL LYMPH NODE BIOPSY Right 12/27/2020   Procedure: RIGHT BREAST LUMPECTOMY WITH RADIOACTIVE SEED AND AXILLARY SENTINEL LYMPH NODE BIOPSY;  Surgeon: Emelia Loron, MD;  Location: [facility] OR;  Service: General;  Laterality: Right;   CHOLECYSTECTOMY  1996   DILATION AND CURETTAGE OF UTERUS  1971   LAPAROSCOPIC REMOVAL OF MESENTERIC MASS     LUMBAR DISC SURGERY  1998   [facility]   PARTIAL HYSTERECTOMY  1981   prolapsed uterus   TUBAL LIGATION  1978   Family History  Problem Relation Age of Onset   Stroke Mother    Cancer Father        unknown type   Cancer Brother        "blood cancer"   CVA Maternal Grandmother    Healthy Son    Breast cancer Neg Hx    Colon cancer Neg Hx    Mental illness Neg Hx    Social History   Socioeconomic History   Marital status: Married    Spouse name: Mariana Kaufman "[name]"   Number of children: 2   Years of education: Not on file   Highest education level: 12th grade  Occupational History   Occupation: retired Runner, broadcasting/film/video  Tobacco Use   Smoking status: Never   Smokeless tobacco: Never  Vaping Use   Vaping Use:  Never used  Substance and Sexual Activity   Alcohol use: No    Alcohol/week: 0.0 standard drinks of alcohol   Drug use: No   Sexual activity: Not Currently    Comment: not asked if sexually active  Other Topics Concern   Not on file  Social History Narrative   Right handed    Lives with husband   Had breast cancer surgery right side   Worked in the school system    Social Determinants of Health   Financial Resource Strain: Low Risk  (02/13/2022)   Overall Financial Resource Strain (CARDIA)    Difficulty of Paying Living Expenses: Not hard at all  Food Insecurity: No Food Insecurity (07/15/2022)   Hunger Vital Sign    Worried About Running Out of Food in the Last Year: Never true    Ran Out of Food in the Last Year: Never true  Transportation Needs: No Transportation Needs  (07/15/2022)   PRAPARE - Administrator, Civil Service (Medical): No    Lack of Transportation (Non-Medical): No  Physical Activity: Unknown (07/14/2022)   Exercise Vital Sign    Days of Exercise per Week: 0 days    Minutes of Exercise per Session: Not on file  Stress: Stress Concern Present (07/14/2022)   [employer] of Occupational Health - Occupational Stress Questionnaire    Feeling of Stress : Very much  Social Connections: Moderately Integrated (07/14/2022)   Social Connection and Isolation Panel [NHANES]    Frequency of Communication with Friends and Family: More than three times a week    Frequency of Social Gatherings with Friends and Family: More than three times a week    Attends Religious Services: 1 to 4 times per year    Active Member of [employer] or Organizations: No    Attends Engineer, structural: Not on file    Marital Status: Married     Review of Systems  Constitutional:        Decreased appetite.  She is eating some.  Weight is down.   HENT:  Negative for congestion and sinus pressure.   Respiratory:  Negative for cough and chest tightness.        Reports sob as outlined.   Cardiovascular:  Negative for chest pain, palpitations and leg swelling.  Gastrointestinal:  Negative for abdominal pain and vomiting.  Genitourinary:  Negative for difficulty urinating and dysuria.  Musculoskeletal:  Negative for joint swelling and myalgias.  Skin:  Negative for color change and rash.  Neurological:  Negative for dizziness and headaches.  Psychiatric/Behavioral:  Negative for suicidal ideas.        Increased anxiety and agitation as outlined.         Objective:     BP 122/72   Pulse (!) 101   Temp 98 F (36.7 C)   Resp 16   Ht 5\' 2"  (1.575 m)   Wt 119 lb 6.4 oz (54.2 kg)   SpO2 99%   BMI 21.84 kg/m  Wt Readings from Last 3 Encounters:  07/15/22 119 lb 6.4 oz (54.2 kg)  07/08/22 122 lb 5.7 oz (55.5 kg)  06/07/22 130 lb (59 kg)     Physical Exam Vitals reviewed.  Constitutional:      General: She is not in acute distress.    Appearance: Normal appearance.  HENT:     Head: Normocephalic and atraumatic.     Right Ear: External ear normal.     Left Ear: External ear  normal.  Eyes:     General: No scleral icterus.       Right eye: No discharge.        Left eye: No discharge.     Conjunctiva/sclera: Conjunctivae normal.  Neck:     Thyroid: No thyromegaly.  Cardiovascular:     Rate and Rhythm: Normal rate and regular rhythm.  Pulmonary:     Effort: No respiratory distress.     Breath sounds: Normal breath sounds. No wheezing.  Abdominal:     General: Bowel sounds are normal.     Palpations: Abdomen is soft.     Tenderness: There is no abdominal tenderness.  Musculoskeletal:        General: No swelling or tenderness.     Cervical back: Neck supple. No tenderness.  Lymphadenopathy:     Cervical: No cervical adenopathy.  Skin:    Findings: No erythema or rash.  Neurological:     Mental Status: She is alert.  Psychiatric:        Mood and Affect: Mood normal.        Behavior: Behavior normal.      No facility-administered encounter medications on file as of 07/15/2022.   Outpatient Encounter Medications as of 07/15/2022  Medication Sig   anastrozole (ARIMIDEX) 1 MG tablet Take 1 tablet (1 mg total) by mouth daily. (Patient not taking: Reported on 07/15/2022)   busPIRone (BUSPAR) 10 MG tablet Take 1 tablet (10 mg total) by mouth 3 (three) times daily.   citalopram (CELEXA) 40 MG tablet Take 1 tablet (40 mg total) by mouth daily.   levalbuterol (XOPENEX HFA) 45 MCG/ACT inhaler Inhale 1-2 puffs into the lungs every 6 (six) hours as needed for wheezing or shortness of breath.   LORazepam (ATIVAN) 0.5 MG tablet Take 1 tablet (0.5 mg total) by mouth 2 (two) times daily.   potassium chloride SA (KLOR-CON M) 20 MEQ tablet Take 1 tablet (20 mEq total) by mouth daily.   pravastatin (PRAVACHOL) 20 MG tablet Take  1 tablet (20 mg total) by mouth daily.   Tiotropium Bromide-Olodaterol (STIOLTO RESPIMAT) 2.5-2.5 MCG/ACT AERS Inhale 1 puff into the lungs daily.   traZODone (DESYREL) 50 MG tablet Take 0.5-1 tablets (25-50 mg total) by mouth at bedtime as needed for sleep. (Patient not taking: Reported on 07/15/2022)   triamterene-hydrochlorothiazide (MAXZIDE-25) 37.5-25 MG tablet Take 1 tablet by mouth daily.     Lab Results  Component Value Date   WBC 10.3 07/15/2022   HGB 12.0 07/15/2022   HCT 35.7 (L) 07/15/2022   PLT 244 07/15/2022   GLUCOSE 87 07/15/2022   CHOL 151 05/13/2022   TRIG 83.0 05/13/2022   HDL 73.00 05/13/2022   LDLDIRECT 148.4 11/03/2012   LDLCALC 61 05/13/2022   ALT 16 07/15/2022   AST 31 07/15/2022   NA 135 07/15/2022   K 3.4 (L) 07/15/2022   CL 97 (L) 07/15/2022   CREATININE 1.45 (H) 07/15/2022   BUN 12 07/15/2022   CO2 27 07/15/2022   TSH 0.798 07/15/2022   HGBA1C 5.6 05/13/2022    DG Chest 2 View  Result Date: 07/08/2022 CLINICAL DATA:  Dyspnea. EXAM: CHEST - 2 VIEW COMPARISON:  Jun 07, 2022. FINDINGS: The heart size and mediastinal contours are within normal limits. Both lungs are clear. Stable severe S-shaped thoracolumbar scoliosis. IMPRESSION: Stable severe thoracolumbar scoliosis.  No acute abnormality seen. Electronically Signed   By: Lupita Raider M.D.   On: 07/08/2022 16:44  Assessment & Plan:  Anxiety Assessment & Plan: Increased stress and anxiety as outlined.  Discussed at length with her and her family.  She is crying and pacing around the room.  Her anxiety has worsened over the last few days.  They feel she needs more intensive therapy.  Discussed further treatment and evaluation.  Discussed with psychiatry.  Agreeable to referral to behavioral urgent care.  Urgent care contacted and information given.  Family to drive her straight to urgent care.    Mild intermittent asthma without complication Assessment & Plan: Lungs are clear.  Recent cxr  ok.  Breathing stable.     Decreased appetite Assessment & Plan: Has occurred with increased anxiety.  Did not feel remeron helped. Off now.  Treatment per psychiatry as outlined.    Essential hypertension, benign Assessment & Plan: Blood pressure as outlined.  On triam/hctz.  Follow pressures.  Follow metabolic panel.    Gastroesophageal reflux disease with esophagitis without hemorrhage Assessment & Plan: No upper symptoms reported.  Continue PPI.    Hypercholesterolemia Assessment & Plan: On pravastatin.  Low cholesterol diet and exercise.  Follow lipid panel and liver function tests.     Hyperglycemia Assessment & Plan: Follow met b and A1c.    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female, estrogen receptor positive [facility]) Assessment & Plan: Saw oncology 11/27/21 - recommended continuing taloxifen 10mg  q hs.  Also mammogram and Dexa 12/24/21.  She is offf tamoxifen now.  Desires not to take.    SOB (shortness of breath) Assessment & Plan: No increased cough or congestion.  Feel her perceived sob is related to her increased anxiety.  Recent cxr ok.  Plan to treat anxiety as outlined.     I spent 45 minutes with the patient .  Time spent discussing her current concerns and symptoms.  Specifically time spent discussing her increased anxiety and plans for further evaluation. Time also spent discussing further w/up, evaluation and treatment.    Dale Badger, MD

## 2022-07-15 NOTE — Telephone Encounter (Signed)
Thanks. I spoke with Dr. Lorin Picket. She reported that pt is having worsening of anxiety and it has been particularly worse since last three days and family is expressed concerns and not able to take care of her due to her anxiety. We mutually decided to refer her to ER. Dr. Lorin Picket will speak with family to bring pt to [facility]. [facility] provider notified.

## 2022-07-15 NOTE — Progress Notes (Signed)
   Initial Treatment Plan 07/15/2022 10:44 PM Jennifer [name]  MRN: [id number]       PATIENT STRESSORS: Anxiety      PATIENT STRENGTHS: Ability for insight  Family support  Communication skills      PATIENT IDENTIFIED PROBLEMS: "I just have these moods and I know I need to get help but I dont want to be away from my family"  "I have dealt with anxiety for many years"  "I been crying for the past 3 days"                        DISCHARGE CRITERIA:  Ability to meet basic life and health needs Improved stabilization in mood, thinking, and/or behavior Verbal commitment to aftercare and medication compliance   PRELIMINARY DISCHARGE PLAN: Outpatient therapy Return to previous living arrangement   PATIENT/FAMILY INVOLVEMENT: This treatment plan has been presented to and reviewed with the patient, Jennifer [name], The patient has been given the opportunity to ask questions and make suggestions.   [place], [place] 07/15/2022 10:47 PM

## 2022-07-15 NOTE — Telephone Encounter (Signed)
Called and spoke with daughter. Will discuss at appt.

## 2022-07-15 NOTE — ED Notes (Signed)
Report given to Kuda [email or web address] for admission to their facility

## 2022-07-15 NOTE — ED Notes (Signed)
Safe transport called to take pt to [facility]

## 2022-07-15 NOTE — BH Assessment (Signed)
Comprehensive Clinical Assessment (CCA) Note  07/15/2022 Jennifer [name] [id number]  Disposition: Per Doran Heater, NP admission to Continuous Assessment at [facility] is recommended for further monitoring/evaluation associated with medication adjustment(noted below) with AM reassessment by psychiatry to determine the most appropriate disposition plan.   The patient demonstrates the following risk factors for suicide: Chronic risk factors for suicide include: psychiatric disorder of GAD, MDD . Acute risk factors for suicide include: N/A. Protective factors for this patient include: positive social support, responsibility to others (children, family), and coping skills. Considering these factors, the overall suicide risk at this point appears to be low. Patient is appropriate for outpatient follow up.  Patient is a 77 year old female with a history of Generalized Anxiety Disorder, Major Depressive Disorder, unspecified and panic attacks who presents voluntarily, accompanied by husband and daughter, to [facility] for assessment.   Patient reports worsening anxiety for the past two months.  She is followed by Dr. Mariam Dollar for medication management and she reports she is compliant with Rx medications.  Currently, she is prescribed Celexa 40mg  every day, Buspar 10mg TID and Ativan 0.5mg  BID.  Pt was referred by Dr.Eapen after patient had a panic attack during her visit today.  On arrival, patient is very anxious and nervous. She states she is afraid that her children are going to put her in a home and this was a trigger for her panic attacks. Patient is also reporting increased depressive sx of tearfulness, difficulty focusing and poor appetite.  Family reports she lost a significant, unknown, amount of weight over the past 2 months.  Patient's husband assists with patient's medication management.  Per patient's family, patient was fairly stable on Clonazepam for over 20 years.  She began to have worsening  symptoms when she was switched from Clonazepam to Ativan 6 months ago.  It appears the symptoms have become unmanageable for patient.  Patient is not engaged in outpatient therapy.  She denies SI, HI, AVH or SA hx.    Provider spoke with patient and family regarding options.  Per provider note: "Reviewed medications, discussed external sexual side effects and offered opportunity to ask questions.  Patient agrees with plan to replace lorazepam 0.5 mg twice daily with clonazepam 0.5 milligrams twice daily as longer acting medication has been more therapeutic previously.  Reviewed potential for falls specifically related to patient's age."    Chief Complaint:  Chief Complaint  Patient presents with   Panic Attack   Anxiety   Visit Diagnosis: Generalized Anxiety Disorder                             Major Depressive Disorder, unspecified    CCA Screening, Triage and Referral (STR)  Patient Reported Information How did you hear about [place]? Family/Friend  What Is the Reason for Your Visit/Call Today? Pt presents to [facility] accompanied by her daughter and husband due to increased anxiety. Pt was referred by PCP Dr.Eappen at [facility] in [place] after a panic attack during her visit. Pt is very anxious and nervous. Pt states she is afraid that her children are going to put her in a home and this was a trigger for her panic attacks. Pt denies SI/HI and AVH.  How Long Has This Been Causing You Problems? <Week  What Do You Feel Would Help You the Most Today? Treatment for Depression or other mood problem   Have You Recently Had Any Thoughts  About Hurting Yourself? No  Are You Planning to Commit Suicide/Harm Yourself At This time? No   Flowsheet Row ED from 07/15/2022 in [facility] ED from 07/08/2022 in [facility] Emergency Department at [facility] Office Visit from 06/26/2022 in [facility]  C-SSRS RISK  CATEGORY No Risk No Risk Low Risk       Have you Recently Had Thoughts About Hurting Someone [name] Ohs? No  Are You Planning to Harm Someone at This Time? No  Explanation: N/A   Have You Used Any Alcohol or Drugs in the Past 24 Hours? No  What Did You Use and How Much? N/A   Do You Currently Have a Therapist/Psychiatrist? No  Name of Therapist/Psychiatrist: Name of Therapist/Psychiatrist: N/A   Have You Been Recently Discharged From Any Office Practice or Programs? No  Explanation of Discharge From Practice/Program: N/A     CCA Screening Triage Referral Assessment Type of Contact: Face-to-Face  Telemedicine Service Delivery:   Is this Initial or Reassessment?   Date Telepsych consult ordered in [facility]:    Time Telepsych consult ordered in [facility]:    Location of Assessment: [facility] Services  Provider Location: [facility]   Collateral Involvement: Patient's daughter and husband provide collateral.   Does Patient Have a Automotive engineer Guardian? No  Legal Guardian Contact Information: N/A  Copy of Legal Guardianship Form: -- (N/A)  Legal Guardian Notified of Arrival: -- (N/A)  Legal Guardian Notified of Pending Discharge: -- (N/A)  If Minor and Not Living with Parent(s), Who has Custody? N/A  Is CPS involved or ever been involved? Never  Is APS involved or ever been involved? Never   Patient Determined To Be At Risk for Harm To Self or Others Based on Review of Patient Reported Information or Presenting Complaint? No  Method: -- (N/A, no HI)  Availability of Means: -- (N/A, no HI)  Intent: -- (N/A, no HI)  Notification Required: -- (N/A, no HI)  Additional Information for Danger to Others Potential: -- (N/A, no HI)  Additional Comments for Danger to Others Potential: N/A, no HI  Are There Guns or Other Weapons in Your Home? No  Types of Guns/Weapons: N/A  Are These Weapons Safely Secured?                            --  (N/A)  Who Could Verify You Are Able To Have These Secured: N/A  Do You Have any Outstanding Charges, Pending Court Dates, Parole/Probation? None  Contacted To Inform of Risk of Harm To Self or Others: -- (N/A)    Does Patient Present under Involuntary Commitment? No    [place] of Residence: [place]   Patient Currently Receiving the Following Services: Not Receiving Services   Determination of Need: Routine (7 days)   Options For Referral: Outpatient Therapy; Medication Management     CCA Biopsychosocial Patient Reported Schizophrenia/Schizoaffective Diagnosis in Past: No   Strengths: Family support   Mental Health Symptoms Depression:   Difficulty Concentrating; Tearfulness   Duration of Depressive symptoms:  Duration of Depressive Symptoms: Greater than two weeks   Mania:   None   Anxiety:    Worrying; Tension; Restlessness   Psychosis:   None   Duration of Psychotic symptoms:    Trauma:   None   Obsessions:   None   Compulsions:   None   Inattention:  N/A   Hyperactivity/Impulsivity:   N/A   Oppositional/Defiant Behaviors:   N/A   Emotional Irregularity:   N/A   Other Mood/Personality Symptoms:   Worsening anxiety for past 2 mos    Mental Status Exam Appearance and self-care  Stature:   Average   Weight:   Thin   Clothing:   Casual   Grooming:   Normal   Cosmetic use:   Age appropriate   Posture/gait:   Normal   Motor activity:   Not Remarkable   Sensorium  Attention:   Normal   Concentration:   Variable   Orientation:   X5   Recall/memory:   Normal   Affect and Mood  Affect:   Anxious   Mood:   Anxious   Relating  Eye contact:   Normal   Facial expression:   Anxious   Attitude toward examiner:   Cooperative   Thought and Language  Speech flow:  Clear and Coherent   Thought content:   Appropriate to Mood and Circumstances   Preoccupation:   None   Hallucinations:   None    Organization:   Intact   [employer] of Knowledge:   Average   Intelligence:   Average   Abstraction:   Normal   Judgement:   Fair   Dance movement psychotherapist:   Adequate   Insight:   Gaps   Decision Making:   Vacilates   Social Functioning  Social Maturity:   Responsible   Social Judgement:   Normal   Stress  Stressors:   Other (Comment) (issues with Rx medications)   Coping Ability:   Exhausted   Skill Deficits:   Communication; Interpersonal   Supports:   Family     Religion: Religion/Spirituality Are You A Religious Person?: No How Might This Affect Treatment?: N/A  Leisure/Recreation: Leisure / Recreation Do You Have Hobbies?: No  Exercise/Diet: Exercise/Diet Do You Exercise?: No Have You Gained or Lost A Significant Amount of Weight in the Past Six Months?: Yes-Lost Number of Pounds Lost?:  (uncertain of amt - lost "significant amt of weight" per family) Do You Follow a Special Diet?: No Do You Have Any Trouble Sleeping?: No   CCA Employment/Education Employment/Work Situation: Employment / Work Academic librarian Situation: Retired Has Patient ever Been in Equities trader?: No  Education: Education Is Patient Currently Attending School?: No Last Grade Completed:  (NA) Did You Product manager?:  (NA) Did You Have An Individualized Education Program (IIEP): No Did You Have Any Difficulty At [employer]?: No Patient's Education Has Been Impacted by Current Illness: No   CCA Family/Childhood History Family and Relationship History: Family history Marital status: Married Number of Years Married:  (NA) What types of issues is patient dealing with in the relationship?: None reported Additional relationship information: NA Does patient have children?: Yes How many children?:  (NA) How is patient's relationship with their children?: No concerns reported  Childhood History:  Childhood History By whom was/is the patient raised?:  Both parents Did patient suffer any verbal/emotional/physical/sexual abuse as a child?: No Did patient suffer from severe childhood neglect?: No Has patient ever been sexually abused/assaulted/raped as an adolescent or adult?: No Was the patient ever a victim of a crime or a disaster?: No Witnessed domestic violence?: No Has patient been affected by domestic violence as an adult?: No       CCA Substance Use Alcohol/Drug Use: Alcohol / Drug Use Pain Medications: See MAR Prescriptions: See MAR Over the Counter: See  MAR History of alcohol / drug use?: No history of alcohol / drug abuse                         ASAM's:  Six Dimensions of Multidimensional Assessment  Dimension 1:  Acute Intoxication and/or Withdrawal Potential:      Dimension 2:  Biomedical Conditions and Complications:      Dimension 3:  Emotional, Behavioral, or Cognitive Conditions and Complications:     Dimension 4:  Readiness to Change:     Dimension 5:  Relapse, Continued use, or Continued Problem Potential:     Dimension 6:  Recovery/Living Environment:     ASAM Severity Score:    ASAM Recommended Level of Treatment:     Substance use Disorder (SUD)    Recommendations for Services/Supports/Treatments:    Discharge Disposition:    DSM5 Diagnoses: Patient Active Problem List   Diagnosis Date Noted   Overuse of medication 06/10/2022   GAD (generalized anxiety disorder) 04/08/2022   Depression 04/08/2022   Anxiety 11/02/2021   Decreased appetite 10/07/2021   Osteopenia 08/22/2021   Ear pain, right 06/13/2021   Tremor 06/07/2021   Hypokalemia 06/07/2021   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female, 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   Nasal congestion 02/04/2020   Sore throat 01/25/2020   Neck pain 08/19/2019   B12 deficiency 08/19/2019   Knee pain 02/04/2019   Memory change 02/04/2019   Radiculopathy of cervical spine 04/20/2017   Spondylosis of  cervical spine 04/19/2017   Ear pain, left 03/27/2017   Right arm pain 11/12/2016   Vertigo 09/02/2016   Left low back pain 05/22/2015   Left hip pain 05/22/2015   SOB (shortness of breath) 11/11/2014   Right shoulder pain 07/16/2014   Health care maintenance 06/03/2014   Arm skin lesion, left 06/18/2013   URI (upper respiratory infection) 03/03/2013   Hyperglycemia 11/12/2012   Hypercholesterolemia 11/12/2012   Essential hypertension, benign 05/12/2012   GERD (gastroesophageal reflux disease) 05/12/2012   Gastritis 05/12/2012   History of colonic polyps 05/12/2012   Asthma 12/13/2011   Chronic restrictive lung disease 05/11/2011     Referrals to Alternative Service(s): Referred to Alternative Service(s):   Place:   Date:   Time:    Referred to Alternative Service(s):   Place:   Date:   Time:    Referred to Alternative Service(s):   Place:   Date:   Time:    Referred to Alternative Service(s):   Place:   Date:   Time:     Yetta Glassman, [facility]

## 2022-07-15 NOTE — ED Provider Notes (Signed)
[facility] Continuous Assessment Admission H&P  Date: 07/15/22 Patient Name: Jennifer [name] MRN: [id number] Chief Complaint: Anxiety, panic attack  Diagnoses:  Final diagnoses:  Generalized anxiety disorder  Panic attacks    HPI: Patient presents voluntarily to [facility] behavioral health for walk-in assessment.  Patient is accompanied by her husband and two adult children, Jennifer [name] and Jennifer [name].  Patient prefers that family remain present during assessment.  Patient is assessed, face-to-face, by nurse practitioner. She is seated in assessment area. Consulted with provider, Dr.  Lucianne Muss, and chart reviewed on 07/15/2022.  Patient is alert and oriented, pleasant and cooperative during assessment.   Patient  presents with anxious mood, tearful affect.  She reports worsening anxiety for approximately 2 months.  She endorses increased episodes of tearfulness, difficulty with focus and decreased appetite.  Patient has reportedly lost a significant amount of weight over the last 2 months, she is uncertain how much precisely.  Patient states "I cannot go on like this, I need help."  Anxiety worsens in the morning she often awakens due to panic attack.  Patient's family confirms she is unable to "live like this."  Jennifer [name] is followed by outpatient psychiatry at [facility] behavioral health outpatient by Dr. Elna Breslow.  She is compliant with medications including Celexa 40 mg daily, buspirone 10 mg 3 times daily and lorazepam 0.5 mg twice daily.  She was prescribed trazodone 25 to 50 mg nightly as needed for sleep however she has not filled this medication as she believes she sleeps fairly well.  Her previous diagnoses include generalized anxiety disorder, depression and panic attacks.  Anxiety managed by primary care provider for approximately 20 years, or more, according to patient's family she was stable on clonazepam during that time.  Updated to lorazepam approximately 6 months ago.  Symptoms  increasing noticeably after medication switch.  Patient has been evaluated in emergency department twice during the last 6 weeks related to reports of shortness of breath and increasing anxiety.  Patient's family assist with medication management for approximately 2 months and confirms she is compliant with medications.  He is not linked with individual counseling, no history of individual counseling.  No history of previous inpatient psychiatric hospitalization.  No family mental health history reported.   She  denies suicidal and homicidal ideations. Denies history of suicide attempts, denies history of nonsuicidal self-harm.  Patient easily  contracts verbally for safety with this Clinical research associate.  Patient has normal speech and behavior.  She  denies auditory and visual hallucinations.  Patient is able to converse coherently with goal-directed thoughts and no distractibility or preoccupation.  Denies symptoms of paranoia.  Objectively there is no evidence of psychosis/mania or delusional thinking.  Jennifer [name] resides in [place] with her husband.  She denies access to weapons.  Patient endorses average sleep.  She denies alcohol use.  She denies substance use aside from CBD Gummies that she used briefly, recommended by her daughter for anxiety.  Patient offered support and encouragement.  Reviewed medications, discussed external sexual side effects and offered opportunity to ask questions.  Patient agrees with plan to replace lorazepam 0.5 mg twice daily with clonazepam 0.5 milligrams twice daily as longer acting medication has been more therapeutic previously.  Reviewed potential for falls specifically related to patient's age.  Patient verbalizes understanding of treatment plan.  She would prefer that family remain with her throughout stay explained that this is not permitted, she verbalized understanding.  Patient uses oxygen 2 L via nasal cannula at  night while sleeping. Reviewed quetiapine with patient and family,  discussed potential side effects, updated regarding blackbox warning, patient and family offered opportunity ask questions.  Patient agrees with plan to initiate quetiapine 50 mg at bedtime. Patient confirms desire to remain full CODE STATUS.  Total Time spent with patient: 45 minutes  Musculoskeletal  Strength & Muscle Tone: within normal limits Gait & Station: normal Patient leans: N/A  Psychiatric Specialty Exam  Presentation General Appearance:  Appropriate for Environment; Casual  Eye Contact: Fair  Speech: Clear and Coherent; Normal Rate  Speech Volume: Normal  Handedness: Right   Mood and Affect  Mood: Anxious  Affect: Tearful   Thought Process  Thought Processes: Coherent; Goal Directed; Linear  Descriptions of Associations:Intact  Orientation:Full (Time, Place and Person)  Thought Content:Logical; WDL    Hallucinations:Hallucinations: None  Ideas of Reference:None  Suicidal Thoughts:Suicidal Thoughts: No  Homicidal Thoughts:Homicidal Thoughts: No   Sensorium  Memory: Immediate Fair  Judgment: Intact  Insight: Present   Executive Functions  Concentration: Poor  Attention Span: Poor  Recall: Poor  Fund of Knowledge: Good  Language: Good   Psychomotor Activity  Psychomotor Activity: Psychomotor Activity: Restlessness   Assets  Assets: Communication Skills; Desire for Improvement; Financial Resources/Insurance; Housing; Resilience; Social Support   Sleep  Sleep: Sleep: Good   Nutritional Assessment (For OBS and FBC admissions only) Has the patient had a weight loss or gain of 10 pounds or more in the last 3 months?: Yes Has the patient had a decrease in food intake/or appetite?: Yes Does the patient have dental problems?: No Does the patient have eating habits or behaviors that may be indicators of an eating disorder including binging or inducing vomiting?: No Has the patient recently lost weight without  trying?: 2.0 Has the patient been eating poorly because of a decreased appetite?: 1 Malnutrition Screening Tool Score: 3    Physical Exam Vitals and nursing note reviewed.  Constitutional:      Appearance: Normal appearance. She is well-developed.  HENT:     Head: Normocephalic and atraumatic.     Nose: Nose normal.  Cardiovascular:     Rate and Rhythm: Normal rate.  Pulmonary:     Effort: Pulmonary effort is normal.  Musculoskeletal:        General: Normal range of motion.     Cervical back: Normal range of motion.  Skin:    General: Skin is warm and dry.  Neurological:     Mental Status: She is alert and oriented to person, place, and time.  Psychiatric:        Attention and Perception: Perception normal.        Mood and Affect: Mood is anxious. Affect is tearful.        Speech: Speech normal.        Behavior: Behavior is cooperative.        Thought Content: Thought content normal.        Cognition and Memory: Cognition and memory normal.   Review of Systems  Constitutional: Negative.   HENT: Negative.    Eyes: Negative.   Respiratory: Negative.    Cardiovascular: Negative.   Gastrointestinal: Negative.   Genitourinary: Negative.   Musculoskeletal: Negative.   Skin: Negative.   Neurological: Negative.   Psychiatric/Behavioral:  The patient is nervous/anxious.     Blood pressure (!) 114/59, pulse (!) 107, temperature 98.3 F (36.8 C), temperature source Oral, resp. rate 18, SpO2 99 %. There is no height or weight on file  to calculate BMI.  Past Psychiatric History: Generalized anxiety disorder, panic attacks, depression  Is the patient at risk to self? No  Has the patient been a risk to self in the past 6 months? No .    Has the patient been a risk to self within the distant past? No   Is the patient a risk to others? No   Has the patient been a risk to others in the past 6 months? No   Has the patient been a risk to others within the distant past? No   Past  Medical History: Arthritis, asthma, bronchitis COPD, dyspnea, gastritis, GERD, hypertension, scoliosis  Family History: None reported  Social History: Retired, resides with family  Last Labs:  Admission on 07/15/2022  Component Date Value Ref Range Status   POC Amphetamine UR 07/15/2022 None Detected  NONE DETECTED (Cut Off Level 1000 ng/mL) Final   POC Secobarbital (BAR) 07/15/2022 None Detected  NONE DETECTED (Cut Off Level 300 ng/mL) Final   POC Buprenorphine (BUP) 07/15/2022 None Detected  NONE DETECTED (Cut Off Level 10 ng/mL) Final   POC Oxazepam (BZO) 07/15/2022 Positive (A)  NONE DETECTED (Cut Off Level 300 ng/mL) Final   POC Cocaine UR 07/15/2022 None Detected  NONE DETECTED (Cut Off Level 300 ng/mL) Final   POC Methamphetamine UR 07/15/2022 None Detected  NONE DETECTED (Cut Off Level 1000 ng/mL) Final   POC Morphine 07/15/2022 None Detected  NONE DETECTED (Cut Off Level 300 ng/mL) Final   POC Methadone UR 07/15/2022 None Detected  NONE DETECTED (Cut Off Level 300 ng/mL) Final   POC Oxycodone UR 07/15/2022 None Detected  NONE DETECTED (Cut Off Level 100 ng/mL) Final   POC Marijuana UR 07/15/2022 Positive (A)  NONE DETECTED (Cut Off Level 50 ng/mL) Final  Admission on 07/08/2022, Discharged on 07/08/2022  Component Date Value Ref Range Status   Sodium 07/08/2022 137  135 - 145 mmol/L Final   Potassium 07/08/2022 4.2  3.5 - 5.1 mmol/L Final   Chloride 07/08/2022 97 (L)  98 - 111 mmol/L Final   CO2 07/08/2022 27  22 - 32 mmol/L Final   Glucose, Bld 07/08/2022 96  70 - 99 mg/dL Final   Glucose reference range applies only to samples taken after fasting for at least 8 hours.   BUN 07/08/2022 10  8 - 23 mg/dL Final   Creatinine, Ser 07/08/2022 1.30 (H)  0.44 - 1.00 mg/dL Final   Calcium 16/10/9602 9.8  8.9 - 10.3 mg/dL Final   GFR, Estimated 07/08/2022 43 (L)  >60 mL/min Final   Comment: (NOTE) Calculated using the CKD-EPI Creatinine Equation (2021)    Anion gap 07/08/2022 13  5  - 15 Final   Performed at [facility], [address] [place], [place] [address]   B Natriuretic Peptide 07/08/2022 47.0  0.0 - 100.0 pg/mL Final   Performed at [facility], [address] [place], [place] [address]   WBC 07/08/2022 9.3  4.0 - 10.5 K/uL Final   RBC 07/08/2022 3.93  3.87 - 5.11 MIL/uL Final   Hemoglobin 07/08/2022 12.7  12.0 - 15.0 g/dL Final   HCT 78/29/5621 38.4  36.0 - 46.0 % Final   MCV 07/08/2022 97.7  80.0 - 100.0 fL Final   MCH 07/08/2022 32.3  26.0 - 34.0 pg Final   MCHC 07/08/2022 33.1  30.0 - 36.0 g/dL Final   RDW 30/86/5784 12.0  11.5 - 15.5 % Final   Platelets 07/08/2022 226  150 - 400 K/uL Final   nRBC 07/08/2022 0.0  0.0 - 0.2 % Final   Neutrophils Relative % 07/08/2022 63  % Final   Neutro Abs 07/08/2022 5.9  1.7 - 7.7 K/uL Final   Lymphocytes Relative 07/08/2022 25  % Final   Lymphs Abs 07/08/2022 2.3  0.7 - 4.0 K/uL Final   Monocytes Relative 07/08/2022 8  % Final   Monocytes Absolute 07/08/2022 0.7  0.1 - 1.0 K/uL Final   Eosinophils Relative 07/08/2022 3  % Final   Eosinophils Absolute 07/08/2022 0.3  0.0 - 0.5 K/uL Final   Basophils Relative 07/08/2022 1  % Final   Basophils Absolute 07/08/2022 0.1  0.0 - 0.1 K/uL Final   Immature Granulocytes 07/08/2022 0  % Final   Abs Immature Granulocytes 07/08/2022 0.03  0.00 - 0.07 K/uL Final   Performed at [facility], [address], [place], [place] [address]   Troponin I (High Sensitivity) 07/08/2022 7  <18 ng/L Final   Comment: (NOTE) Elevated high sensitivity troponin I (hsTnI) values and significant  changes across serial measurements may suggest ACS but many other  chronic and acute conditions are known to elevate hsTnI results.  Refer to the "Links" section for chest pain algorithms and additional  guidance. Performed at [facility], [address], [place], [place] [address]    Magnesium 07/08/2022 1.8  1.7 - 2.4 mg/dL Final   Performed at [facility],  [address], [place], [place] [address]   SARS Coronavirus 2 by RT PCR 07/08/2022 NEGATIVE  NEGATIVE Final   Influenza A by PCR 07/08/2022 NEGATIVE  NEGATIVE Final   Influenza B by PCR 07/08/2022 NEGATIVE  NEGATIVE Final   Comment: (NOTE) The Xpert Xpress SARS-CoV-2/FLU/RSV plus assay is intended as an aid in the diagnosis of influenza from Nasopharyngeal swab specimens and should not be used as a sole basis for treatment. Nasal washings and aspirates are unacceptable for Xpert Xpress SARS-CoV-2/FLU/RSV testing.  Fact Sheet for Patients: BloggerCourse.com  Fact Sheet for Healthcare Providers: SeriousBroker.it  This test is not yet approved or cleared by the [place] FDA and has been authorized for detection and/or diagnosis of SARS-CoV-2 by FDA under an Emergency Use Authorization (EUA). This EUA will remain in effect (meaning this test can be used) for the duration of the COVID-19 declaration under Section 564(b)(1) of the Act, 21 U.S.C. section 360bbb-3(b)(1), unless the authorization is terminated or revoked.     Resp Syncytial Virus by PCR 07/08/2022 NEGATIVE  NEGATIVE Final   Comment: (NOTE) Fact Sheet for Patients: BloggerCourse.com  Fact Sheet for Healthcare Providers: SeriousBroker.it  This test is not yet approved or cleared by the [place] FDA and has been authorized for detection and/or diagnosis of SARS-CoV-2 by FDA under an Emergency Use Authorization (EUA). This EUA will remain in effect (meaning this test can be used) for the duration of the COVID-19 declaration under Section 564(b)(1) of the Act, 21 U.S.C. section 360bbb-3(b)(1), unless the authorization is terminated or revoked.  Performed at [facility], [address], [place], [place] [address]    Troponin I (High Sensitivity) 07/08/2022 6  <18 ng/L Final   Comment: (NOTE) Elevated high  sensitivity troponin I (hsTnI) values and significant  changes across serial measurements may suggest ACS but many other  chronic and acute conditions are known to elevate hsTnI results.  Refer to the "Links" section for chest pain algorithms and additional  guidance. Performed at  [facility], [address] [place], [place], [place] [address]    D-Dimer, Quant 07/08/2022 0.50  0.00 - 0.50 ug/mL-FEU Final   Comment: (NOTE) At the manufacturer cut-off value of 0.5 g/mL FEU, this assay has a negative predictive value of 95-100%.This assay is intended for use in conjunction with a clinical pretest probability (PTP) assessment model to exclude pulmonary embolism (PE) and deep venous thrombosis (DVT) in outpatients suspected of PE or DVT. Results should be correlated with clinical presentation. Performed at [facility], [address] [place], [place] [address]   Lab on 07/03/2022  Component Date Value Ref Range Status   Potassium 07/03/2022 3.4 (L)  3.5 - 5.1 mEq/L Final  Admission on 06/07/2022, Discharged on 06/07/2022  Component Date Value Ref Range Status   WBC 06/07/2022 9.8  4.0 - 10.5 K/uL Final   RBC 06/07/2022 4.29  3.87 - 5.11 MIL/uL Final   Hemoglobin 06/07/2022 13.6  12.0 - 15.0 g/dL Final   HCT 01/21/7251 40.2  36.0 - 46.0 % Final   MCV 06/07/2022 93.7  80.0 - 100.0 fL Final   MCH 06/07/2022 31.7  26.0 - 34.0 pg Final   MCHC 06/07/2022 33.8  30.0 - 36.0 g/dL Final   RDW 66/44/0347 12.2  11.5 - 15.5 % Final   Platelets 06/07/2022 236  150 - 400 K/uL Final   nRBC 06/07/2022 0.0  0.0 - 0.2 % Final   Neutrophils Relative % 06/07/2022 70  % Final   Neutro Abs 06/07/2022 6.8  1.7 - 7.7 K/uL Final   Lymphocytes Relative 06/07/2022 21  % Final   Lymphs Abs 06/07/2022 2.1  0.7 - 4.0 K/uL Final   Monocytes Relative 06/07/2022 7  % Final   Monocytes Absolute 06/07/2022 0.7  0.1 - 1.0 K/uL Final   Eosinophils Relative 06/07/2022 1  % Final   Eosinophils Absolute  06/07/2022 0.1  0.0 - 0.5 K/uL Final   Basophils Relative 06/07/2022 1  % Final   Basophils Absolute 06/07/2022 0.1  0.0 - 0.1 K/uL Final   Immature Granulocytes 06/07/2022 0  % Final   Abs Immature Granulocytes 06/07/2022 0.02  0.00 - 0.07 K/uL Final   Performed at [facility], [address] [place], [place] [address]   B Natriuretic Peptide 06/07/2022 21.5  0.0 - 100.0 pg/mL Final   Performed at [facility], [address], [place], [place] [address]   Sodium 06/07/2022 136  135 - 145 mmol/L Final   Potassium 06/07/2022 2.9 (L)  3.5 - 5.1 mmol/L Final   Chloride 06/07/2022 98  98 - 111 mmol/L Final   CO2 06/07/2022 23  22 - 32 mmol/L Final   Glucose, Bld 06/07/2022 107 (H)  70 - 99 mg/dL Final   Glucose reference range applies only to samples taken after fasting for at least 8 hours.   BUN 06/07/2022 10  8 - 23 mg/dL Final   Creatinine, Ser 06/07/2022 1.21 (H)  0.44 - 1.00 mg/dL Final   Calcium 64/33/2951 9.6  8.9 - 10.3 mg/dL Final   Total Protein 88/41/6606 6.6  6.5 - 8.1 g/dL Final   Albumin 30/16/0109 4.1  3.5 - 5.0 g/dL Final   AST 32/35/5732 28  15 - 41 U/L Final   ALT 06/07/2022 14  0 - 44 U/L Final   Alkaline Phosphatase 06/07/2022 49  38 - 126 U/L Final   Total Bilirubin 06/07/2022 1.2  0.3 - 1.2 mg/dL Final   GFR,  Estimated 06/07/2022 47 (L)  >60 mL/min Final   Comment: (NOTE) Calculated using the CKD-EPI Creatinine Equation (2021)    Anion gap 06/07/2022 15  5 - 15 Final   Performed at [facility], [address] [place], [place] [address]   Troponin I (High Sensitivity) 06/07/2022 12  <18 ng/L Final   Comment: (NOTE) Elevated high sensitivity troponin I (hsTnI) values and significant  changes across serial measurements may suggest ACS but many other  chronic and acute conditions are known to elevate hsTnI results.  Refer to the "Links" section for chest pain algorithms and additional  guidance. Performed at [facility], [address], [place], [place] [address]    Magnesium 06/07/2022 1.8  1.7 - 2.4 mg/dL Final   Performed at [facility], [address], [place], [place] [address]   D-Dimer, Quant 06/07/2022 <0.27  0.00 - 0.50 ug/mL-FEU Final   Comment: (NOTE) At the manufacturer cut-off value of 0.5 g/mL FEU, this assay has a negative predictive value of 95-100%.This assay is intended for use in conjunction with a clinical pretest probability (PTP) assessment model to exclude pulmonary embolism (PE) and deep venous thrombosis (DVT) in outpatients suspected of PE or DVT. Results should be correlated with clinical presentation. Performed at [facility], [address] [place], [place] [address]    Troponin I (High Sensitivity) 06/07/2022 10  <18 ng/L Final   Comment: (NOTE) Elevated high sensitivity troponin I (hsTnI) values and significant  changes across serial measurements may suggest ACS but many other  chronic and acute conditions are known to elevate hsTnI results.  Refer to the "Links" section for chest pain algorithms and additional  guidance. Performed at [facility], [address] [place], [place] [address]   Lab on 05/28/2022  Component Date Value Ref Range Status   Potassium 05/28/2022 3.7  3.5 - 5.1 mEq/L Final  Lab on 05/22/2022  Component Date Value Ref Range Status   Glucose 05/22/2022 116 (H)  70 - 99 mg/dL Final   BUN 13/08/6576 9  8 - 27 mg/dL Final   Creatinine, Ser 05/22/2022 0.91  0.57 - 1.00 mg/dL Final   eGFR 46/96/2952 66  >59 mL/min/1.73 Final   BUN/Creatinine Ratio 05/22/2022 10 (L)  12 - 28 Final   Sodium 05/22/2022 142  134 - 144 mmol/L Final   Potassium 05/22/2022 3.2 (L)  3.5 - 5.2 mmol/L Final   Chloride 05/22/2022 98  96 - 106 mmol/L Final   CO2 05/22/2022 30 (H)  20 - 29 mmol/L Final   Calcium 05/22/2022 9.4  8.7 - 10.3 mg/dL Final  Office Visit on 05/13/2022  Component Date Value Ref Range Status   Total Bilirubin 05/13/2022 0.7  0.2 - 1.2 mg/dL  Final   Bilirubin, Direct 05/13/2022 0.1  0.0 - 0.3 mg/dL Final   Alkaline Phosphatase 05/13/2022 52  39 - 117 U/L Final   AST 05/13/2022 18  0 - 37 U/L Final   ALT 05/13/2022 9  0 - 35 U/L Final   Total Protein 05/13/2022 6.8  6.0 - 8.3 g/dL Final   Albumin 84/13/2440 4.4  3.5 - 5.2 g/dL Final   Hgb N0U MFr Bld 05/13/2022 5.6  4.6 - 6.5 % Final   Glycemic Control Guidelines for People with Diabetes:Non Diabetic:  <6%Goal of Therapy: <7%Additional Action Suggested:  >8%    Sodium 05/13/2022 140  135 - 145 mEq/L Final   Potassium 05/13/2022 3.5  3.5 - 5.1 mEq/L Final   Chloride 05/13/2022 98  96 - 112 mEq/L Final   CO2 05/13/2022 32  19 - 32 mEq/L Final   Glucose, Bld 05/13/2022 92  70 - 99 mg/dL Final   BUN 95/62/1308 13  6 - 23 mg/dL Final   Creatinine, Ser 05/13/2022 1.17  0.40 - 1.20 mg/dL Final   GFR 65/78/4696 45.71 (L)  >60.00 mL/min Final   Calculated using the CKD-EPI Creatinine Equation (2021)   Calcium 05/13/2022 9.8  8.4 - 10.5 mg/dL Final   Cholesterol 29/52/8413 151  0 - 200 mg/dL Final   ATP III Classification       Desirable:  < 200 mg/dL               Borderline High:  200 - 239 mg/dL          High:  > = 244 mg/dL   Triglycerides 01/21/7251 83.0  0.0 - 149.0 mg/dL Final   Normal:  <664 mg/dLBorderline High:  150 - 199 mg/dL   HDL 40/34/7425 95.63  >39.00 mg/dL Final   VLDL 87/56/4332 16.6  0.0 - 40.0 mg/dL Final   LDL Cholesterol 05/13/2022 61  0 - 99 mg/dL Final   Total CHOL/HDL Ratio 05/13/2022 2   Final                  Men          Women1/2 Average Risk     3.4          3.3Average Risk          5.0          4.42X Average Risk          9.6          7.13X Average Risk          15.0          11.0                       NonHDL 05/13/2022 78.07   Final   NOTE:  Non-HDL goal should be 30 mg/dL higher than patient's LDL goal (i.e. LDL goal of < 70 mg/dL, would have non-HDL goal of < 100 mg/dL)  Lab on 95/18/8416  Component Date Value Ref Range Status   Potassium 04/15/2022  3.5  3.5 - 5.1 mEq/L Final  Lab on 03/24/2022  Component Date Value Ref Range Status   Potassium 03/24/2022 3.4 (L)  3.5 - 5.1 mEq/L Final  Appointment on 03/23/2022  Component Date Value Ref Range Status   Sodium 03/23/2022 139  135 - 145 mmol/L Final   Potassium 03/23/2022 4.2  3.5 - 5.1 mmol/L Final   Chloride 03/23/2022 98  98 - 111 mmol/L Final   CO2 03/23/2022 36 (H)  22 - 32 mmol/L Final   Glucose, Bld 03/23/2022 99  70 - 99 mg/dL Final   Glucose reference range applies only to samples taken after fasting for at least 8 hours.   BUN 03/23/2022 13  8 - 23 mg/dL Final   Creatinine 60/63/0160 0.97  0.44 - 1.00 mg/dL Final   Calcium 10/93/2355 9.9  8.9 - 10.3 mg/dL Final   Total Protein 73/22/0254 6.8  6.5 - 8.1 g/dL Final   Albumin 27/06/2374 4.3  3.5 - 5.0 g/dL Final   AST 28/31/5176 21  15 - 41 U/L Final   ALT 03/23/2022 13  0 - 44 U/L Final   Alkaline  Phosphatase 03/23/2022 55  38 - 126 U/L Final   Total Bilirubin 03/23/2022 0.5  0.3 - 1.2 mg/dL Final   GFR, Estimated 03/23/2022 >60  >60 mL/min Final   Comment: (NOTE) Calculated using the CKD-EPI Creatinine Equation (2021)    Anion gap 03/23/2022 5  5 - 15 Final   Performed at [facility], [address] [place], [place] [address]   WBC Count 03/23/2022 8.7  4.0 - 10.5 K/uL Final   RBC 03/23/2022 4.23  3.87 - 5.11 MIL/uL Final   Hemoglobin 03/23/2022 13.9  12.0 - 15.0 g/dL Final   HCT 41/32/4401 40.0  36.0 - 46.0 % Final   MCV 03/23/2022 94.6  80.0 - 100.0 fL Final   MCH 03/23/2022 32.9  26.0 - 34.0 pg Final   MCHC 03/23/2022 34.8  30.0 - 36.0 g/dL Final   RDW 02/72/5366 12.1  11.5 - 15.5 % Final   Platelet Count 03/23/2022 232  150 - 400 K/uL Final   nRBC 03/23/2022 0.0  0.0 - 0.2 % Final   Neutrophils Relative % 03/23/2022 63  % Final   Neutro Abs 03/23/2022 5.5  1.7 - 7.7 K/uL Final   Lymphocytes Relative 03/23/2022 24  % Final   Lymphs Abs 03/23/2022 2.1  0.7 - 4.0 K/uL Final   Monocytes  Relative 03/23/2022 8  % Final   Monocytes Absolute 03/23/2022 0.7  0.1 - 1.0 K/uL Final   Eosinophils Relative 03/23/2022 4  % Final   Eosinophils Absolute 03/23/2022 0.3  0.0 - 0.5 K/uL Final   Basophils Relative 03/23/2022 1  % Final   Basophils Absolute 03/23/2022 0.1  0.0 - 0.1 K/uL Final   Immature Granulocytes 03/23/2022 0  % Final   Abs Immature Granulocytes 03/23/2022 0.02  0.00 - 0.07 K/uL Final   Performed at [facility], [address] [place], [place] [address]  There may be more visits with results that are not included.    Allergies: Advair diskus [fluticasone-salmeterol], Codeine, and Pollen extract  Medications:  Facility Ordered Medications  Medication   acetaminophen (TYLENOL) tablet 650 mg   alum & mag hydroxide-simeth (MAALOX/MYLANTA) 200-200-20 MG/5ML suspension 30 mL   magnesium hydroxide (MILK OF MAGNESIA) suspension 30 mL   [COMPLETED] clonazePAM (KLONOPIN) tablet 0.5 mg   PTA Medications  Medication Sig   anastrozole (ARIMIDEX) 1 MG tablet Take 1 tablet (1 mg total) by mouth daily. (Patient not taking: Reported on 07/15/2022)   citalopram (CELEXA) 40 MG tablet Take 1 tablet (40 mg total) by mouth daily.   traZODone (DESYREL) 50 MG tablet Take 0.5-1 tablets (25-50 mg total) by mouth at bedtime as needed for sleep. (Patient not taking: Reported on 07/15/2022)   pravastatin (PRAVACHOL) 20 MG tablet Take 1 tablet (20 mg total) by mouth daily.   triamterene-hydrochlorothiazide (MAXZIDE-25) 37.5-25 MG tablet Take 1 tablet by mouth daily.   potassium chloride SA (KLOR-CON M) 20 MEQ tablet Take 1 tablet (20 mEq total) by mouth daily.   LORazepam (ATIVAN) 0.5 MG tablet Take 1 tablet (0.5 mg total) by mouth 2 (two) times daily.   busPIRone (BUSPAR) 10 MG tablet Take 1 tablet (10 mg total) by mouth 3 (three) times daily.   Tiotropium Bromide-Olodaterol (STIOLTO RESPIMAT) 2.5-2.5 MCG/ACT AERS Inhale 1 puff into the lungs daily.   levalbuterol  (XOPENEX HFA) 45 MCG/ACT inhaler Inhale 1-2 puffs into the lungs every 6 (six) hours as needed for wheezing or shortness of breath.  Medical Decision Making  Patient remains voluntary.  She will be admitted to continuous observation at [facility] behavioral health while awaiting inpatient psychiatric treatment. Patient under review at [facility] for inpatient psychiatric admission.  Laboratory studies ordered including CBC, CMP, ethanol, magnesium, prolactin and TSH.  Urine drug screen  and UA ordered.  EKG order initiated.   Current medications: -Acetaminophen 650 mg every 6 as needed/mild pain -Maalox 30 mL oral every 4 as needed/digestion -Magnesium hydroxide 30 mL daily as needed/mild constipation -Clonazepam 0.5 mg twice daily/anxiety -Quetiapine 50 mg nightly/mood  Prior to admission medications initiated: -Arformoterol nebulizer solution 50 mcg twice daily -Umeclidinium bromide 62.5 mcg ACT 1 puff inhalation daily -Buspirone 10 mg 3 times daily -Citalopram 40 mg daily -Levalbuterol inhaler 1 to 2 puffs every 6 hours as needed/wheezing or shortness of breath -Potassium chloride 20 CR 20 meq daily -Pravastatin 20 mg daily -Triamterene-hydrochlorothiazide 37.5-25 mg 1 tablet daily   Recommendations  Based on my evaluation the patient does not appear to have an emergency medical condition.  Lenard Lance, FNP 07/15/22  6:19 PM

## 2022-07-15 NOTE — Progress Notes (Signed)
   07/15/22 1513  [facility] Triage Screening (Walk-ins at [facility] only)  How Did You Hear About [place]? Family/Friend  What Is the Reason for Your Visit/Call Today? Pt presents to [facility] accompanied by her daughter and husband due to increased anxiety. Pt was referred by PCP Dr.Eappen at [facility] in [place] after a panic attack during her visit. Pt is very anxious and nervous. Pt states she is afraid that her children are going to put her in a home and this was a trigger for her panic attacks. Pt denies SI/HI and AVH.  How Long Has This Been Causing You Problems? <Week  Have You Recently Had Any Thoughts About Hurting Yourself? No  Are You Planning to Commit Suicide/Harm Yourself At This time? No  Have you Recently Had Thoughts About Hurting Someone [name] Ohs? No  Are You Planning To Harm Someone At This Time? No  Are you currently experiencing any auditory, visual or other hallucinations? No  Have You Used Any Alcohol or Drugs in the Past 24 Hours? No  Do you have any current medical co-morbidities that require immediate attention? No  Clinician description of patient physical appearance/behavior: anxious, tearful  What Do You Feel Would Help You the Most Today? Treatment for Depression or other mood problem  If access to [facility] was not available, would you have sought care in the Emergency Department? No  Determination of Need Routine (7 days)  Options For Referral Outpatient Therapy;Medication Management

## 2022-07-16 ENCOUNTER — Telehealth: Payer: Self-pay

## 2022-07-16 DIAGNOSIS — F411 Generalized anxiety disorder: Secondary | ICD-10-CM

## 2022-07-16 MED ORDER — ADULT MULTIVITAMIN W/MINERALS CH
1.0000 | ORAL_TABLET | Freq: Every day | ORAL | Status: DC
  Administered 2022-07-16 – 2022-07-20 (×5): 1 via ORAL
  Filled 2022-07-16 (×5): qty 1

## 2022-07-16 MED ORDER — RISPERIDONE 1 MG PO TABS
0.5000 mg | ORAL_TABLET | ORAL | Status: DC
  Administered 2022-07-16 – 2022-07-20 (×8): 0.5 mg via ORAL
  Filled 2022-07-16 (×8): qty 1

## 2022-07-16 NOTE — Progress Notes (Signed)
Suicide Risk Assessment  Admission Assessment    [facility] Admission Suicide Risk Assessment   Nursing information obtained from:  Patient Demographic factors:  Age 75 or older Current Mental Status:  NA Loss Factors:  Decline in physical health Historical Factors:  Impulsivity Risk Reduction Factors:  Living with another person, especially a relative  Total Time spent with patient: 45 minutes Principal Problem: GAD (generalized anxiety disorder) Diagnosis:  Principal Problem:   GAD (generalized anxiety disorder)  Subjective Data: Patient seen and chart reviewed.  Jennifer [name], 77 year old female presented voluntarily to [facility] due to worsening anxiety over the past 2 months. She continues to endorse anxiety.  Continued Clinical Symptoms:  Alcohol Use Disorder Identification Test Final Score (AUDIT): 0 The "Alcohol Use Disorders Identification Test", Guidelines for Use in Primary Care, Second Edition.  World Science writer [facility]). Score between 0-7:  no or low risk or alcohol related problems. Score between 8-15:  moderate risk of alcohol related problems. Score between 16-19:  high risk of alcohol related problems. Score 20 or above:  warrants further diagnostic evaluation for alcohol dependence and treatment.   CLINICAL FACTORS:   Severe Anxiety and/or Agitation Panic Attacks Medical Diagnoses and Treatments/Surgeries   Musculoskeletal: Strength & Muscle Tone: within normal limits Gait & Station: normal Patient leans: N/A  Psychiatric Specialty Exam:  Presentation  General Appearance:  Casual  Eye Contact: Good  Speech: Clear and Coherent  Speech Volume: Normal  Handedness: Right   Mood and Affect  Mood: Anxious  Affect: Tearful   Thought Process  Thought Processes: Coherent  Descriptions of Associations:Intact  Orientation:Full (Time, Place and Person)  Thought Content:Logical; WDL  History of  Schizophrenia/Schizoaffective disorder:No  Duration of Psychotic Symptoms:No data recorded Hallucinations:Hallucinations: None  Ideas of Reference:None  Suicidal Thoughts:Suicidal Thoughts: No  Homicidal Thoughts:Homicidal Thoughts: No   Sensorium  Memory: Immediate Fair  Judgment: Intact  Insight: Present   Executive Functions  Concentration: Fair  Attention Span: Fair  Recall: Fair  Fund of Knowledge: Good  Language: Good   Psychomotor Activity  Psychomotor Activity: Psychomotor Activity: Restlessness   Assets  Assets: Communication Skills; Desire for Improvement; Financial Resources/Insurance; Resilience; Social Support   Sleep  Sleep: Sleep: Good    Physical Exam: Physical Exam Vitals and nursing note reviewed.  HENT:     Head: Normocephalic.     Nose: Nose normal.  Cardiovascular:     Rate and Rhythm: Tachycardia present.     Comments: Elevated heart rate: 110  Pulmonary:     Effort: Pulmonary effort is normal. No respiratory distress.  Musculoskeletal:        General: Normal range of motion.     Cervical back: Normal range of motion.  Skin:    General: Skin is dry.  Neurological:     Mental Status: She is alert and oriented to person, place, and time.  Psychiatric:        Attention and Perception: Attention and perception normal. She does not perceive auditory or visual hallucinations.        Mood and Affect: Mood is anxious. Affect is tearful.        Speech: Speech normal.        Behavior: Behavior is cooperative.        Thought Content: Thought content normal. Thought content is not paranoid or delusional. Thought content does not include homicidal or suicidal ideation.        Cognition and Memory: Cognition and memory normal.  Judgment: Judgment normal.    Review of Systems  Respiratory:  Negative for cough.        Patient reports a history of COPD  Musculoskeletal:        Patient reports a history of Scoliosis   Psychiatric/Behavioral:  The patient is nervous/anxious.   All other systems reviewed and are negative.  Blood pressure 112/60, pulse (!) 110, temperature 98.4 F (36.9 C), temperature source Oral, resp. rate 18, height 5\' 2"  (1.575 m), weight 54.7 kg, SpO2 99 %. Body mass index is 22.04 kg/m.   COGNITIVE FEATURES THAT CONTRIBUTE TO RISK:  None    SUICIDE RISK:   Minimal: No identifiable suicidal ideation.  Patients presenting with no risk factors but with morbid ruminations; may be classified as minimal risk based on the severity of the depressive symptoms  PLAN OF CARE: Continue 15-minute checks. Continue psychiatric medication. Engage in individual and group therapy.   I certify that inpatient services furnished can reasonably be expected to improve the patient's condition.   Norma Fredrickson, NP 07/16/2022, 3:46 PM

## 2022-07-16 NOTE — Progress Notes (Signed)
ADMISSION DAR NOTE:   Patient present from [facility] via Safe Transport alert and oriented x 3 ambulating with steady gait. Patient is very tearful " I am about to have a panic attack I am scared I don't want to be here I want to go home I miss my Husband we have been married since 1969" Patient very scared. Patient was reassured. Stated she has been feeling anxious and crying a lot for the past three days  just thinking that her family will leave her. She Denies SI/HI/A/VH and verbally contracted for safety. Patient stated she has history of anxiety for years and COPD is on Oxygen at night. Denies any history of abuse.  Emotional support and availability offered to Patient as needed. Skin assessment done and belongings searched per protocol. Unit orientation and routine discussed, Care Plan reviewed as well and Patient verbalized understanding. Fluids and Food offered, tolerated well. Q15 minutes safety checks initiated without self harm gestures.

## 2022-07-16 NOTE — Progress Notes (Signed)
NUTRITION ASSESSMENT  Pt identified as at risk on the Malnutrition Screen Tool  INTERVENTION:  -Liberalize diet to regular for wider variety of meal selections -MVI with minerals daily -Ensure Enlive po BID, each supplement provides 350 kcal and 20 grams of protein.   NUTRITION DIAGNOSIS: Unintentional weight loss related to sub-optimal intake as evidenced by pt report.   Goal: Pt to meet >/= 90% of their estimated nutrition needs.  Monitor:  PO intake  Assessment:  Pt admitted with GAD.   Per RN notes, pt has been tearful and anxious since arriving to unit.   Pt currently on a heart healthy diet. No meal completion data available to assess at this time.   Reviewed wt hx; pt has experienced a 10% wt loss over the past 7 months. While this is not significant for time frame, it is concerning given advanced age and multiple co-morbidities.   Pt would greatly benefit from addition of oral nutrition supplements to optimize oral intake. RD will also liberalize diet for wider variety of meal selections.   Medications reviewed and include potassium chloride.   Labs reviewed: K: 3.4 (on PO supplementation).   77 y.o. female  Height: Ht Readings from Last 1 Encounters:  07/15/22 5\' 2"  (1.575 m)    Weight: Wt Readings from Last 1 Encounters:  07/15/22 54.7 kg    Weight Hx: Wt Readings from Last 10 Encounters:  07/15/22 54.7 kg  07/15/22 54.2 kg  07/08/22 55.5 kg  06/07/22 59 kg  05/13/22 57.6 kg  03/23/22 60.2 kg  02/13/22 59.4 kg  02/10/22 59.4 kg  12/16/21 60.8 kg  11/14/21 61.9 kg    BMI:  Body mass index is 22.04 kg/m. BMI WDL.   Estimated Nutritional Needs: Kcal: 25-30 kcal/kg Protein: > 1 gram protein/kg Fluid: 1 ml/kcal  Diet Order:  Diet Order             Diet Heart Room service appropriate? Yes; Fluid consistency: Thin  Diet effective now                  Pt is also offered choice of unit snacks mid-morning and mid-afternoon.  Pt is  eating as desired.   Lab results and medications reviewed.   Levada Schilling, RD, LDN, CDCES Registered Dietitian II Certified Diabetes Care and Education Specialist Please refer to [facility] for RD and/or RD on-call/weekend/after hours pager

## 2022-07-16 NOTE — Group Note (Signed)
Recreation Therapy Group Note   Group Topic:Health and Wellness  Group Date: 07/16/2022 Start Time: 1400 End Time: 1455 Facilitators: Rosina Lowenstein, LRT, CTRS Location:  Day Room  Group Description: Seated Exercise. LRT discussed the mental and physical benefits of exercise. LRT and group discussed how physical activity can be used as a coping skill. Pt's and LRT followed along to an exercise video on the TV screen that provided a visual representation and audio description of every exercise performed. Pt's encouraged to listen to their bodies and stop at any time if they experience feelings of discomfort or pain. LRT passed out water after session was over and encouraged pts do drink and stay hydrated.  Goal Area(s) Addressed: Patient will learn benefits of physical activity. Patient will identify exercise as a coping skill.  Patient will follow multistep directions. Patient will try a new leisure interest.   Affect/Mood: Appropriate   Participation Level: Active and Engaged   Participation Quality: Independent   Behavior: Calm and Cooperative   Speech/Thought Process: Coherent   Insight: Good   Judgement: Good   Modes of Intervention: Activity   Patient Response to Interventions:  Attentive, Engaged, Interested , and Receptive   Education Outcome:  Acknowledges education   Clinical Observations/Individualized Feedback: Jennifer [name] was active in their participation of session activities and group discussion. Pt completed almost all exercises appropriately while interacting well with LRT and peers duration of session.   Plan: Continue to engage patient in RT group sessions 2-3x/week.   Rosina Lowenstein, LRT, CTRS 07/16/2022 3:49 PM

## 2022-07-16 NOTE — Progress Notes (Signed)
Patient very anxious and tearful.  Pacing the halls and often at the nurse's station.  Patient states she has been married over 50 years and has never been away from her husband. Morning mediations given early to help ease her anxiety.  Denies SI, HI and AVH.  Denies pain.   Patient's anxiety was better controled as the day progressed.  Patient verbally deescalated by both peers and staff.  Often walking the halls as a coping mechanism.

## 2022-07-16 NOTE — [facility] Suicide Risk Assessment (Signed)
[facility] INPATIENT:  Family/Significant Other Suicide Prevention Education  Suicide Prevention Education:  Patient Refusal for Family/Significant Other Suicide Prevention Education: The patient Jennifer [name] has refused to provide written consent for family/significant other to be provided Family/Significant Other Suicide Prevention Education during admission and/or prior to discharge.  Physician notified.  SPE completed with pt, as pt refused to consent to family contact. SPI pamphlet provided to pt and pt was encouraged to share information with support network, ask questions, and talk about any concerns relating to SPE. Pt denies access to guns/firearms and verbalized understanding of information provided. Mobile Crisis information also provided to pt.   [name] 07/16/2022, 1:44 PM

## 2022-07-16 NOTE — Group Note (Signed)
Date:  07/16/2022 Time:  10:28 PM  Group Topic/Focus:  Crisis Planning:   The purpose of this group is to help patients create a crisis plan for use upon discharge or in the future, as needed.    Participation Level:  Did Not Attend  Participation Quality:   Did Not Attend  Affect:   Did Not Attend  Cognitive:   Did Not Attend  Insight: None  Engagement in Group:  Distracting  Modes of Intervention:  Education  Additional Comments:    Garry Heater 07/16/2022, 10:28 PM

## 2022-07-16 NOTE — Plan of Care (Signed)

## 2022-07-16 NOTE — Group Note (Signed)
Date:  07/16/2022 Time:  10:26 AM  Group Topic/Focus:  Crisis Planning:   The purpose of this group is to help patients create a crisis plan for use upon discharge or in the future, as needed.    Participation Level:  Minimal  Participation Quality:  Inattentive and Sharing  Affect:  Tearful and Scared  Cognitive:  Confused  Insight: Limited  Engagement in Group:  Limited and Off Topic  Modes of Intervention:  Activity and Discussion  Additional Comments: Stated that she was scared and wanted her husband tried to calm and ease but to no avail.   Nohemi Nicklaus T Noe Gens 07/16/2022, 10:26 AM

## 2022-07-16 NOTE — H&P (Addendum)
Psychiatric Admission Assessment Adult  Patient Identification: Jennifer [name] MRN:  [id number] Date of Evaluation:  07/16/2022 Chief Complaint:  GAD (generalized anxiety disorder) [F41.1] Principal Diagnosis: GAD (generalized anxiety disorder) Diagnosis:  Principal Problem:   GAD (generalized anxiety disorder)  History of Present Illness:  Patient seen face to face by this provider, consulted with Dr. Marlou Porch; and chart reviewed on 07/14/22. Jennifer [name], 77 y.o., female with past psychiatric history generalized anxiety disorder, panic attacks presented voluntarily to [facility] due to worsening anxiety over the past 2 months.  On chart review, patient reportedly stable on clonazepam but was switched to lorazepam about 6 months ago. Her symptoms have reportedly worsened since the medication switch. Over the past 6 weeks, she has visited the emergency department twice due to reported shortness of breath and escalating anxiety. On evaluation Jennifer [name] reports worsening anxiety over the past 2 months, characterized by increased tearfulness, difficulty focusing, and decreased appetite, leading to significant weight loss. She is unable to identify specific stressors that may have contributed to her increased anxiety.  However, upon chart review, it was noted that the patient previously reported being triggered by the belief that her children are planning to place her in a care facility. Patient expresses fear and a strong desire to go home, stating that she has never been separated from her husband.  Patient denies history of suicidal self-injurious behavior, past suicide attempt or psychiatric hospitalization. She denies history of trauma, abuse or neglect. She is currently connected with outpatient psychiatry services through [facility] where she sees Dr. Elna Breslow. She states that she is compliant with medications. She is prescribed buspirone 10 mg three  times daily, lorazepam 0.5 mg twice daily, Celexa 40 mg daily. She is also prescribed trazodone 25 to 50 mg at bedtime as needed, however she reported she has not filled this medication she believes her sleep is adequate.  Patient resides in [place] with her husband. She reports that she has been married 55 years and has one son and an adoptive daughter. She reports feeling safe at home. 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  She reports a medical history of scoliosis and COPD. She identifies her support system as her spouse, neighbors, and friends.  She denies access to firearms.  Patient denies illicit substance use, with the exception of CBD Gummies that she used temporarily, recommended by her daughter for anxiety symptoms.  She denies alcohol use. BAL unremarkable. UDS + cannabinoid, oxazepam. PDMP Review revealed 1 active prescription for Lorazepam 0.5 MG Tablet: Written 06/10/22, Filled 07/10/22 (60.00).   During evaluation Jennifer [name] is seated in a chair in no acute distress. She is alert, oriented x 4, pleasant, tearful and anxious though cooperative. Speech is clear and coherent. Objectively there is no evidence of psychosis/mania or delusional thinking.  Patient is able to converse coherently, goal directed thoughts, no distractibility, or pre-occupation. She also denies suicidal/self-harm/homicidal ideation, psychosis, and paranoia. Patient answered questions appropriately.   Associated Signs/Symptoms: Depression Symptoms:  depressed mood, difficulty concentrating, anxiety, panic attacks, weight loss, decreased appetite, (Hypo) Manic Symptoms:   N/A Anxiety Symptoms:  Excessive Worry, Panic Symptoms, Psychotic Symptoms:   N/A PTSD Symptoms: NA Total Time spent with patient: 45 minutes  Past Psychiatric History: She reported previous diagnoses generalized anxiety disorder, panic attacks, and depression. Anxiety managed by primary  care provider for approximately 20 years, or more.  Is  the patient at risk to self? No.  Has the patient been a risk to self in the past 6 months? No.  Has the patient been a risk to self within the distant past? No.  Is the patient a risk to others? No.  Has the patient been a risk to others in the past 6 months? No.  Has the patient been a risk to others within the distant past? No.   [place] Scale:  Flowsheet Row Admission (Current) from 07/15/2022 in [facility] Most recent reading at 07/15/2022 10:44 PM ED from 07/15/2022 in [facility] Most recent reading at 07/15/2022  3:26 PM ED from 07/08/2022 in [facility] Emergency Department at [facility] Most recent reading at 07/08/2022  3:44 PM  C-SSRS RISK CATEGORY No Risk No Risk No Risk        Prior Inpatient Therapy: No. If yes, describe  Prior Outpatient Therapy: No. If yes, describe   Alcohol Screening: Patient refused Alcohol Screening Tool: Yes 1. How often do you have a drink containing alcohol?: Never 2. How many drinks containing alcohol do you have on a typical day when you are drinking?: 1 or 2 3. How often do you have six or more drinks on one occasion?: Never AUDIT-C Score: 0 4. How often during the last year have you found that you were not able to stop drinking once you had started?: Never 5. How often during the last year have you failed to do what was normally expected from you because of drinking?: Never 6. How often during the last year have you needed a first drink in the morning to get yourself going after a heavy drinking session?: Never 7. How often during the last year have you had a feeling of guilt of remorse after drinking?: Never 8. How often during the last year have you been unable to remember what happened the night before because you had been drinking?: Never 9. Have you or someone else been injured as a result of your drinking?: No 10. Has a  relative or friend or a doctor or another health worker been concerned about your drinking or suggested you cut down?: No Alcohol Use Disorder Identification Test Final Score (AUDIT): 0 Substance Abuse History in the last 12 months:  No. 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 Antony Blackbird   Hypertension    PONV (postoperative nausea and vomiting)    Scoliosis    Ulcer     Past Surgical History:  Procedure Laterality Date   ABDOMINAL HYSTERECTOMY     ANTERIOR CERVICAL DECOMP/DISCECTOMY FUSION N/A 04/19/2017   Procedure: Anterior Cervical Decompression Fusion Cervical Three-Four, Cervical Four-Five, Cervical Five-Six;  Surgeon: Donalee Citrin, MD;  Location: [facility] OR;  Service: Neurosurgery;  Laterality: N/A;  Anterior Cervical Decompression Fusion Cervical Three-Four, Cervical Four-Five, Cervical Five-Six   BREAST BIOPSY Right 11/14/2020   [place] Bx, Venus Clip, Invasive Mammary Carcinoma   BREAST LUMPECTOMY Right 2022  With radiation   BREAST LUMPECTOMY WITH RADIOACTIVE SEED AND SENTINEL LYMPH NODE BIOPSY Right 12/27/2020   Procedure: RIGHT BREAST LUMPECTOMY WITH RADIOACTIVE SEED AND AXILLARY SENTINEL LYMPH NODE BIOPSY;  Surgeon: Emelia Loron, MD;  Location: [facility] OR;  Service: General;  Laterality: Right;   CHOLECYSTECTOMY  1996   DILATION AND CURETTAGE OF UTERUS  1971   LAPAROSCOPIC REMOVAL OF MESENTERIC MASS     LUMBAR DISC SURGERY  1998   [facility]   PARTIAL HYSTERECTOMY  1981   prolapsed uterus   TUBAL LIGATION  1978   Family History:   Family History  Problem Relation Age of Onset   Stroke Mother    Cancer Father        unknown type   Cancer Brother        "blood cancer"   CVA Maternal Grandmother    Healthy Son    Breast cancer Neg Hx    Colon cancer Neg Hx    Mental illness Neg Hx    Family Psychiatric  History: No family mental health history reported  Tobacco Screening:  Social History   Tobacco Use  Smoking Status Never  Smokeless Tobacco Never    [facility] Tobacco Counseling     Are you interested in Tobacco Cessation Medications?  No value filed. Counseled patient on smoking cessation:  No value filed. Reason Tobacco Screening Not Completed: No value filed.       Social History:  Social History   Substance and Sexual Activity  Alcohol Use No   Alcohol/week: 0.0 standard drinks of alcohol     Social History   Substance and Sexual Activity  Drug Use No    Additional Social History:                           Allergies:   Allergies  Allergen Reactions   Advair Diskus [Fluticasone-Salmeterol] Other (See Comments)    Causes blisters in her mouth   Codeine     Unknown reaction   Pollen Extract Cough   Lab Results:  Results for orders placed or performed during the hospital encounter of 07/15/22 (from the past 48 hour(s))  POCT Urine Drug Screen - (I-Screen)     Status: Abnormal   Collection Time: 07/15/22  6:03 PM  Result Value Ref Range   POC Amphetamine UR None Detected NONE DETECTED (Cut Off Level 1000 ng/mL)   POC Secobarbital (BAR) None Detected NONE DETECTED (Cut Off Level 300 ng/mL)   POC Buprenorphine (BUP) None Detected NONE DETECTED (Cut Off Level 10 ng/mL)   POC Oxazepam (BZO) Positive (A) NONE DETECTED (Cut Off Level 300 ng/mL)   POC Cocaine UR None Detected NONE DETECTED (Cut Off Level 300 ng/mL)   POC Methamphetamine UR None Detected NONE DETECTED (Cut Off Level 1000 ng/mL)   POC Morphine None Detected NONE DETECTED (Cut Off Level 300 ng/mL)   POC Methadone UR  None Detected NONE DETECTED (Cut Off Level 300 ng/mL)   POC Oxycodone UR None Detected NONE DETECTED (Cut Off Level 100 ng/mL)   POC Marijuana UR Positive (A) NONE DETECTED (Cut Off Level 50 ng/mL)  CBC with Differential/Platelet     Status: Abnormal   Collection Time: 07/15/22  6:15 PM  Result Value Ref Range   WBC 10.3 4.0 - 10.5 K/uL   RBC 3.78 (L) 3.87 - 5.11 MIL/uL   Hemoglobin 12.0 12.0 - 15.0 g/dL   HCT 60.4 (L) 54.0 - 98.1 %  MCV 94.4 80.0 - 100.0 fL   MCH 31.7 26.0 - 34.0 pg   MCHC 33.6 30.0 - 36.0 g/dL   RDW 32.3 55.7 - 32.2 %   Platelets 244 150 - 400 K/uL   nRBC 0.0 0.0 - 0.2 %   Neutrophils Relative % 66 %   Neutro Abs 6.9 1.7 - 7.7 K/uL   Lymphocytes Relative 21 %   Lymphs Abs 2.1 0.7 - 4.0 K/uL   Monocytes Relative 9 %   Monocytes Absolute 1.0 0.1 - 1.0 K/uL   Eosinophils Relative 3 %   Eosinophils Absolute 0.3 0.0 - 0.5 K/uL   Basophils Relative 1 %   Basophils Absolute 0.1 0.0 - 0.1 K/uL   Immature Granulocytes 0 %   Abs Immature Granulocytes 0.03 0.00 - 0.07 K/uL    Comment: Performed at [facility], [address] [place], [place] [address]  Comprehensive metabolic panel     Status: Abnormal   Collection Time: 07/15/22  6:15 PM  Result Value Ref Range   Sodium 135 135 - 145 mmol/L   Potassium 3.4 (L) 3.5 - 5.1 mmol/L   Chloride 97 (L) 98 - 111 mmol/L   CO2 27 22 - 32 mmol/L   Glucose, Bld 87 70 - 99 mg/dL    Comment: Glucose reference range applies only to samples taken after fasting for at least 8 hours.   BUN 12 8 - 23 mg/dL   Creatinine, Ser 7.06 (H) 0.44 - 1.00 mg/dL   Calcium 9.7 8.9 - 23.7 mg/dL   Total Protein 6.8 6.5 - 8.1 g/dL   Albumin 4.4 3.5 - 5.0 g/dL   AST 31 15 - 41 U/L   ALT 16 0 - 44 U/L   Alkaline Phosphatase 46 38 - 126 U/L   Total Bilirubin 1.0 0.3 - 1.2 mg/dL   GFR, Estimated 38 (L) >60 mL/min    Comment: (NOTE) Calculated using the CKD-EPI Creatinine Equation (2021)    Anion gap 11 5 - 15    Comment: Performed at  [facility], [address], [place], [place] [address]  Magnesium     Status: None   Collection Time: 07/15/22  6:15 PM  Result Value Ref Range   Magnesium 2.0 1.7 - 2.4 mg/dL    Comment: Performed at [facility], [address], [place], [place] [address]  Ethanol     Status: None   Collection Time: 07/15/22  6:15 PM  Result Value Ref Range   Alcohol, Ethyl (B) <10 <10 mg/dL    Comment: (NOTE) Lowest detectable limit for serum alcohol is 10 mg/dL.  For medical purposes only. Performed at [facility], [address] [place], [place] [address]   TSH     Status: None   Collection Time: 07/15/22  6:15 PM  Result Value Ref Range   TSH 0.798 0.350 - 4.500 uIU/mL    Comment: Performed by a 3rd Generation assay with a functional sensitivity of <=0.01 uIU/mL. Performed at [facility], [address] [place], [place] [address]   Urinalysis, Complete w Microscopic -Urine, Clean Catch     Status: Abnormal   Collection Time: 07/15/22  6:20 PM  Result Value Ref Range   Color, Urine YELLOW YELLOW   APPearance HAZY (A) CLEAR   Specific Gravity, Urine 1.008 1.005 - 1.030   pH 7.0 5.0 - 8.0   Glucose, UA NEGATIVE NEGATIVE mg/dL   Hgb urine dipstick SMALL (  A) NEGATIVE   Bilirubin Urine NEGATIVE NEGATIVE   Ketones, ur NEGATIVE NEGATIVE mg/dL   Protein, ur NEGATIVE NEGATIVE mg/dL   Nitrite NEGATIVE NEGATIVE   Leukocytes,Ua LARGE (A) NEGATIVE   RBC / HPF 0-5 0 - 5 RBC/hpf   WBC, UA 0-5 0 - 5 WBC/hpf   Bacteria, UA RARE (A) NONE SEEN   Squamous Epithelial / HPF 0-5 0 - 5 /HPF    Comment: Performed at [facility], [address], [place], [place] [address]    Blood Alcohol level:  Lab Results  Component Value Date   ETH <10 07/15/2022    Metabolic Disorder Labs:  Lab Results  Component Value Date   HGBA1C 5.6 05/13/2022   No results found for: "PROLACTIN" Lab Results  Component Value Date   CHOL 151 05/13/2022   TRIG 83.0 05/13/2022   HDL  73.00 05/13/2022   CHOLHDL 2 05/13/2022   VLDL 16.6 05/13/2022   LDLCALC 61 05/13/2022   LDLCALC 86 02/10/2022    Current Medications: Current Facility-Administered Medications  Medication Dose Route Frequency Provider Last Rate Last Admin   acetaminophen (TYLENOL) tablet 650 mg  650 mg Oral Q6H PRN Lenard Lance, FNP       alum & mag hydroxide-simeth (MAALOX/MYLANTA) 200-200-20 MG/5ML suspension 30 mL  30 mL Oral Q4H PRN Lenard Lance, FNP       arformoterol (BROVANA) nebulizer solution 15 mcg  15 mcg Nebulization BID Lenard Lance, FNP   15 mcg at 07/16/22 0831   And   umeclidinium bromide (INCRUSE ELLIPTA) 62.5 MCG/ACT 1 puff  1 puff Inhalation Daily Lenard Lance, FNP   1 puff at 07/16/22 0831   busPIRone (BUSPAR) tablet 10 mg  10 mg Oral TID Lenard Lance, FNP   10 mg at 07/16/22 0850   citalopram (CELEXA) tablet 40 mg  40 mg Oral Daily Lenard Lance, FNP   40 mg at 07/16/22 1017   clonazePAM (KLONOPIN) tablet 0.5 mg  0.5 mg Oral BID Ajibola, Ene A, NP   0.5 mg at 07/16/22 0849   diphenhydrAMINE (BENADRYL) capsule 50 mg  50 mg Oral TID PRN Lenard Lance, FNP       Or   diphenhydrAMINE (BENADRYL) injection 50 mg  50 mg Intramuscular TID PRN Lenard Lance, FNP       feeding supplement (ENSURE ENLIVE / ENSURE PLUS) liquid 237 mL  237 mL Oral BID BM Sarina Ill, DO   237 mL at 07/16/22 1116   haloperidol (HALDOL) tablet 5 mg  5 mg Oral TID PRN Lenard Lance, FNP       Or   haloperidol lactate (HALDOL) injection 5 mg  5 mg Intramuscular TID PRN Lenard Lance, FNP       levalbuterol (XOPENEX HFA) inhaler 1-2 puff  1-2 puff Inhalation Q6H PRN Lenard Lance, FNP       LORazepam (ATIVAN) tablet 2 mg  2 mg Oral TID PRN Lenard Lance, FNP       Or   LORazepam (ATIVAN) injection 2 mg  2 mg Intramuscular TID PRN Lenard Lance, FNP       magnesium hydroxide (MILK OF MAGNESIA) suspension 30 mL  30 mL Oral Daily PRN Lenard Lance, FNP       multivitamin with minerals tablet 1 tablet  1  tablet Oral Daily Sarina Ill, DO   1 tablet at 07/16/22 1017   potassium chloride  SA (KLOR-CON M) CR tablet 20 mEq  20 mEq Oral Daily Lenard Lance, FNP   20 mEq at 07/16/22 1017   pravastatin (PRAVACHOL) tablet 20 mg  20 mg Oral Daily Lenard Lance, FNP   20 mg at 07/16/22 1017   QUEtiapine (SEROQUEL) tablet 50 mg  50 mg Oral QHS Lenard Lance, FNP       traZODone (DESYREL) tablet 25-50 mg  25-50 mg Oral QHS PRN Ajibola, Ene A, NP   50 mg at 07/15/22 2322   triamterene-hydrochlorothiazide (MAXZIDE-25) 37.5-25 MG per tablet 1 tablet  1 tablet Oral Daily Lenard Lance, FNP       PTA Medications: Medications Prior to Admission  Medication Sig Dispense Refill Last Dose   anastrozole (ARIMIDEX) 1 MG tablet Take 1 tablet (1 mg total) by mouth daily. (Patient not taking: Reported on 07/15/2022) 30 tablet 11    busPIRone (BUSPAR) 10 MG tablet Take 1 tablet (10 mg total) by mouth 3 (three) times daily. 90 tablet 1    citalopram (CELEXA) 40 MG tablet Take 1 tablet (40 mg total) by mouth daily. 90 tablet 0    levalbuterol (XOPENEX HFA) 45 MCG/ACT inhaler Inhale 1-2 puffs into the lungs every 6 (six) hours as needed for wheezing or shortness of breath. 1 each 2    LORazepam (ATIVAN) 0.5 MG tablet Take 1 tablet (0.5 mg total) by mouth 2 (two) times daily. 60 tablet 1    potassium chloride SA (KLOR-CON M) 20 MEQ tablet Take 1 tablet (20 mEq total) by mouth daily. 14 tablet 0    pravastatin (PRAVACHOL) 20 MG tablet Take 1 tablet (20 mg total) by mouth daily. 30 tablet 5    Tiotropium Bromide-Olodaterol (STIOLTO RESPIMAT) 2.5-2.5 MCG/ACT AERS Inhale 1 puff into the lungs daily. 4 g 2    traZODone (DESYREL) 50 MG tablet Take 0.5-1 tablets (25-50 mg total) by mouth at bedtime as needed for sleep. (Patient not taking: Reported on 07/15/2022) 30 tablet 1    triamterene-hydrochlorothiazide (MAXZIDE-25) 37.5-25 MG tablet Take 1 tablet by mouth daily. 30 tablet 5     Musculoskeletal: Strength & Muscle  Tone: within normal limits Gait & Station: normal Patient leans: N/A            Psychiatric Specialty Exam:  Presentation  General Appearance:  Appropriate for Environment; Casual  Eye Contact: Fair  Speech: Clear and Coherent; Normal Rate  Speech Volume: Normal  Handedness: Right   Mood and Affect  Mood: Anxious  Affect: Tearful   Thought Process  Thought Processes: Coherent; Goal Directed; Linear  Duration of Psychotic Symptoms:N/A Past Diagnosis of Schizophrenia or Psychoactive disorder: No  Descriptions of Associations:Intact  Orientation:Full (Time, Place and Person)  Thought Content:Logical; WDL  Hallucinations:Hallucinations: None  Ideas of Reference:None  Suicidal Thoughts:Suicidal Thoughts: No  Homicidal Thoughts:Homicidal Thoughts: No   Sensorium  Memory: Immediate Fair  Judgment: Intact  Insight: Present   Executive Functions  Concentration: Poor  Attention Span: Poor  Recall: Poor  Fund of Knowledge: Good  Language: Good   Psychomotor Activity  Psychomotor Activity: Psychomotor Activity: Restlessness   Assets  Assets: Communication Skills; Desire for Improvement; Financial Resources/Insurance; Housing; Resilience; Social Support   Sleep  Sleep: Sleep: Good    Physical Exam: Physical Exam Vitals and nursing note reviewed.  HENT:     Head: Normocephalic.     Nose: Nose normal.  Cardiovascular:     Rate and Rhythm: Tachycardia present.     Comments: Elevated  heart rate: 110 Pulmonary:     Effort: Pulmonary effort is normal. No respiratory distress.     Comments: Patient reports that history of COPD Musculoskeletal:        General: Normal range of motion.     Cervical back: Normal range of motion.     Comments: Patient reports that history of Scoliosis  Neurological:     Mental Status: She is alert and oriented to person, place, and time.  Psychiatric:        Attention and Perception:  Attention and perception normal. She does not perceive auditory or visual hallucinations.        Mood and Affect: Mood is anxious. Affect is tearful.        Speech: Speech normal.        Behavior: Behavior is cooperative.        Thought Content: Thought content is not paranoid or delusional. Thought content does not include homicidal or suicidal ideation.        Cognition and Memory: Cognition and memory normal.        Judgment: Judgment normal.    Review of Systems  Respiratory:  Negative for shortness of breath.        Patient reports a history of COPD  Neurological:        Patient reports that history of Scoliosis  Psychiatric/Behavioral:  Positive for depression. The patient is nervous/anxious.   All other systems reviewed and are negative.  Blood pressure 112/60, pulse (!) 110, temperature 98.4 F (36.9 C), temperature source Oral, resp. rate 18, height 5\' 2"  (1.575 m), weight 54.7 kg, SpO2 99 %. Body mass index is 22.04 kg/m.  Treatment Plan Summary: Daily contact with patient to assess and evaluate symptoms and progress in treatment, Medication management, and Plan : No medication changes at this time. Continue buspirone 10 mg 3 times daily, citalopram 40 mg daily, clonazepam 0.5 mg 2 times daily, risperidone 0.5 mg every morning and afternoon.  Observation Level/Precautions:  15 minute checks  Laboratory:   No new labs at this time   Psychotherapy:    Medications:    Consultations:    Discharge Concerns:    Estimated LOS:  Other:     Physician Treatment Plan for Primary Diagnosis: GAD (generalized anxiety disorder) Long Term Goal(s): Improvement in symptoms so as ready for discharge  Short Term Goals: Ability to demonstrate self-control will improve and Ability to identify and develop effective coping behaviors will improve  Physician Treatment Plan for Secondary Diagnosis: Principal Problem:   GAD (generalized anxiety disorder)  Long Term Goal(s): Improvement in  symptoms so as ready for discharge  Short Term Goals: Ability to maintain clinical measurements within normal limits will improve  I certify that inpatient services furnished can reasonably be expected to improve the patient's condition.    Norma Fredrickson, NP 6/27/202411:46 AM

## 2022-07-16 NOTE — Telephone Encounter (Signed)
Transition Care Management Unsuccessful Follow-up Telephone Call  Date of discharge and from where:   6/19  Attempts:  2nd Attempt  Reason for unsuccessful TCM follow-up call:  Left voice message   Jani Ploeger Pop [facility] Guide, [place] [phone] [address], [place], [place] [address] Phone: [phone] Email: [email or web address]       

## 2022-07-16 NOTE — BHH Counselor (Signed)
Adult Comprehensive Assessment  [name], female   DOB: 1945/05/20, 77 y.o.   MRN: [id number]  Information Source: Information source: Patient  Current Stressors:  Patient states their primary concerns and needs for treatment are:: "panic attacks, crying" Patient states their goals for this hospitilization and ongoing recovery are:: "want to go home and be with my family" Educational / Learning stressors: Pt denies. Employment / Job issues: Pt denies. Family Relationships: Pt denies. Financial / Lack of resources (include bankruptcy): Pt denies. Housing / Lack of housing: Pt denies. Physical health (include injuries & life threatening diseases): "scoliosis and COPD" Social relationships: Pt denies. Substance abuse: Pt denies. Bereavement / Loss: Pt denies.  Living/Environment/Situation:  Living Arrangements: Spouse/significant other Living conditions (as described by patient or guardian): WNL Who else lives in the home?: "husband" How long has patient lived in current situation?: "25 years" What is atmosphere in current home: Comfortable, Paramedic, Supportive  Family History:  Marital status: Married Number of Years Married: 55 Does patient have children?: Yes How many children?: 2 How is patient's relationship with their children?: "great"  Childhood History:  By whom was/is the patient raised?: Grandparents, Mother/father and step-parent Additional childhood history information: Pt reports that she was raised by mother, stepfather and grandmother. Description of patient's relationship with caregiver when they were a child: "it was great, she took car eof me and my brother" Patient's description of current relationship with people who raised him/her: Deceased. How were you disciplined when you got in trouble as a child/adolescent?: "probably had to stay in the house and couldn't go out and play with my neighbors" Does patient have siblings?: Yes Number of  Siblings: 1 Description of patient's current relationship with siblings: "great, we've always been close" Did patient suffer any verbal/emotional/physical/sexual abuse as a child?: No Did patient suffer from severe childhood neglect?: No Has patient ever been sexually abused/assaulted/raped as an adolescent or adult?: No Was the patient ever a victim of a crime or a disaster?: No Witnessed domestic violence?: No Has patient been affected by domestic violence as an adult?: No  Education:  Highest grade of school patient has completed: "12th" Currently a student?: No Learning disability?: No  Employment/Work Situation:   Employment Situation: Retired Therapist, art is the Longest Time Patient has Held a Job?: "15 years" 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?: No  Financial Resources:   Financial resources: [employer], Receives SSI Does patient have a Lawyer or guardian?: No  Alcohol/Substance Abuse:   What has been your use of drugs/alcohol within the last 12 months?: Pt denies. If attempted suicide, did drugs/alcohol play a role in this?: No Alcohol/Substance Abuse Treatment Hx: Denies past history Has alcohol/substance abuse ever caused legal problems?: No  Social Support System:   Patient's Community Support System: Good Describe Community Support System: "my family, neighbors and friends" Type of faith/religion: "Methodist" How does patient's faith help to cope with current illness?: "pray a lot but it wont make it go away"  Leisure/Recreation:   Do You Have Hobbies?: Yes Leisure and Hobbies: "puzzle books"  Strengths/Needs:   What is the patient's perception of their strengths?: "good mother, good wife, good MiMi" Patient states they can use these personal strengths during their treatment to contribute to their recovery: Pt denies. Patient states these barriers may affect/interfere with their treatment:  Pt denies. Patient states these barriers may affect their return to the community: Pt denies.  Discharge Plan:   Patient states concerns and preferences for aftercare planning are: Pt reports that she is open to a referral to see a therapist at discharge. Patient states they will know when they are safe and ready for discharge when: "because I got people to take care of me and they are wonderful" Does patient have access to transportation?: Yes Does patient have financial barriers related to discharge medications?: No Will patient be returning to same living situation after discharge?: Yes  Summary/Recommendations:   Summary and Recommendations (to be completed by the evaluator): Patient is a 77 year old female [place] [place] [facility]  Patient presents to the hospital for concerns in an increase in panic attacks.  Patient reports that her anxiety has increased for the past two months.  When meeting with this writer patient was unable to identify any triggers in the past couple of months that may have led to the increase in her mental health symptoms.  However, in chart review of initial assessment the patient reports that she was triggered by a belief that her children are going to place her in a home.  Patient also reported at that time an increase in depressive symptoms, tearfulness, difficulty focusing and a poor appetite.  Family reported that patient has dropped a significant amount of weight in the past two months.  Patient sees a medication provider, however, does not have a provider for therapy.  Patient has indicated that she would like a referral for a therapist at discharge.  Recommendations include: crisis stabilization, therapeutic milieu, encourage group attendance and participation, medication management for mood stabilization and development of comprehensive mental wellness plan.  [name]. 07/16/2022

## 2022-07-17 ENCOUNTER — Institutional Professional Consult (permissible substitution): Payer: Medicare Other | Admitting: Student in an Organized Health Care Education/Training Program

## 2022-07-17 DIAGNOSIS — F411 Generalized anxiety disorder: Secondary | ICD-10-CM | POA: Diagnosis not present

## 2022-07-17 NOTE — Group Note (Signed)
Recreation Therapy Group Note   Group Topic:Leisure Education  Group Date: 07/17/2022 Start Time: 1400 End Time: 1455 Facilitators: Rosina Lowenstein, LRT, CTRS Location:  Dayroom  Group Description: Leisure. Patients were given the option to choose from coloring, singing karaoke, or playing cards. LRT and pts discussed the meaning of leisure, the importance of participating in leisure during their free time/when they're outside of the hospital, as well as how our leisure interests can also serve as coping skills. Pt identified two leisure interests and shared with the group.   Goal Area(s) Addressed:  Patient will identify a current leisure interest.  Patient will learn the definition of "leisure". Patient will practice making a positive decision. Patient will have the opportunity to try a new leisure activity. Patient will communicate with peers and LRT.   Affect/Mood: Appropriate   Participation Level: Moderate   Participation Quality: Independent   Behavior: Appropriate and Calm   Speech/Thought Process: Coherent   Insight: Good   Judgement: Good   Modes of Intervention: Activity   Patient Response to Interventions:  Receptive   Education Outcome:  Acknowledges education   Clinical Observations/Individualized Feedback: Jadalee was mostly active in their participation of session activities and group discussion. Pt identified "I like to watch my grand children and do things with my husband". Pt chose to listen to music while in group. Pt was observed to be singing. Pt interacted well with LRT and peers duration of session.   Plan: Continue to engage patient in RT group sessions 2-3x/week.   Rosina Lowenstein, LRT, CTRS 07/17/2022 4:00 PM

## 2022-07-17 NOTE — Progress Notes (Addendum)
[facility] MD Progress Note  07/17/2022 2:26 PM Jennifer [name]  MRN:  [id number] Subjective: Jennifer [name] is seen on rounds.  She says that she is sleeping better and her appetite is improved.  I started her on Celexa, Risperdal, Seroquel, and Klonopin..  She seems to be less anxious.  She states that she is feeling better.  Nurses report no issues. Principal Problem: GAD (generalized anxiety disorder) Diagnosis: Principal Problem:   GAD (generalized anxiety disorder)  Total Time spent with patient: 15 minutes  Past Psychiatric History: Anxiety and she sees Dr. 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 Antony Blackbird   Hypertension    PONV (postoperative nausea and vomiting)    Scoliosis    Ulcer     Past Surgical History:  Procedure Laterality Date   ABDOMINAL HYSTERECTOMY     ANTERIOR CERVICAL DECOMP/DISCECTOMY FUSION N/A 04/19/2017   Procedure: Anterior Cervical Decompression Fusion Cervical Three-Four, Cervical Four-Five, Cervical Five-Six;  Surgeon: Donalee Citrin, MD;  Location: [facility] OR;  Service: Neurosurgery;  Laterality: N/A;  Anterior Cervical Decompression Fusion Cervical Three-Four, Cervical Four-Five, Cervical Five-Six   BREAST BIOPSY Right 11/14/2020   [place] Bx, Venus Clip, Invasive Mammary Carcinoma   BREAST LUMPECTOMY Right 2022   With radiation   BREAST LUMPECTOMY WITH RADIOACTIVE SEED AND SENTINEL LYMPH NODE BIOPSY Right 12/27/2020   Procedure: RIGHT BREAST LUMPECTOMY WITH RADIOACTIVE SEED AND AXILLARY SENTINEL LYMPH NODE BIOPSY;  Surgeon: Emelia Loron, MD;  Location: [facility] OR;  Service: General;  Laterality: Right;   CHOLECYSTECTOMY  1996    DILATION AND CURETTAGE OF UTERUS  1971   LAPAROSCOPIC REMOVAL OF MESENTERIC MASS     LUMBAR DISC SURGERY  1998   [facility]   PARTIAL HYSTERECTOMY  1981   prolapsed uterus   TUBAL LIGATION  1978   Family History:  Family History  Problem Relation Age of Onset   Stroke Mother    Cancer Father        unknown type   Cancer Brother        "blood cancer"   CVA Maternal Grandmother    Healthy Son    Breast cancer Neg Hx    Colon cancer Neg Hx    Mental illness Neg Hx    Family Psychiatric  History: Unremarkable Social History:  Social History   Substance and Sexual Activity  Alcohol Use No   Alcohol/week: 0.0 standard drinks of alcohol     Social History   Substance and Sexual Activity  Drug Use No    Social History   Socioeconomic History   Marital status: Married    Spouse name: Erie Noe"   Number of children: 2   Years of education: Not on file   Highest education level: 12th grade  Occupational History   Occupation: retired Runner, broadcasting/film/video  Tobacco Use   Smoking status: Never   Smokeless tobacco: Never  Vaping Use   Vaping Use: Never used  Substance and Sexual Activity   Alcohol use: No    Alcohol/week: 0.0 standard drinks of alcohol   Drug  use: No   Sexual activity: Not Currently    Comment: not asked if sexually active  Other Topics Concern   Not on file  Social History Narrative   Right handed    Lives with husband   Had breast cancer surgery right side   Worked in the school system    Social Determinants of Health   Financial Resource Strain: Low Risk  (02/13/2022)   Overall Financial Resource Strain (CARDIA)    Difficulty of Paying Living Expenses: Not hard at all  Food Insecurity: No Food Insecurity (07/15/2022)   Hunger Vital Sign    Worried About Running Out of Food in the Last Year: Never true    Ran Out of Food in the Last Year: Never true  Transportation Needs: No Transportation Needs (07/15/2022)   PRAPARE - Scientist, research (physical sciences) (Medical): No    Lack of Transportation (Non-Medical): No  Physical Activity: Unknown (07/14/2022)   Exercise Vital Sign    Days of Exercise per Week: 0 days    Minutes of Exercise per Session: Not on file  Stress: Stress Concern Present (07/14/2022)   [employer] of Occupational Health - Occupational Stress Questionnaire    Feeling of Stress : Very much  Social Connections: Moderately Integrated (07/14/2022)   Social Connection and Isolation Panel [NHANES]    Frequency of Communication with Friends and Family: More than three times a week    Frequency of Social Gatherings with Friends and Family: More than three times a week    Attends Religious Services: 1 to 4 times per year    Active Member of [employer] or Organizations: No    Attends Engineer, structural: Not on file    Marital Status: Married   Additional Social History:                         Sleep: Good  Appetite:  Good  Current Medications: Current Facility-Administered Medications  Medication Dose Route Frequency Provider Last Rate Last Admin   acetaminophen (TYLENOL) tablet 650 mg  650 mg Oral Q6H PRN Lenard Lance, FNP   650 mg at 07/17/22 1419   alum & mag hydroxide-simeth (MAALOX/MYLANTA) 200-200-20 MG/5ML suspension 30 mL  30 mL Oral Q4H PRN Lenard Lance, FNP       arformoterol (BROVANA) nebulizer solution 15 mcg  15 mcg Nebulization BID Lenard Lance, FNP   15 mcg at 07/16/22 2235   And   umeclidinium bromide (INCRUSE ELLIPTA) 62.5 MCG/ACT 1 puff  1 puff Inhalation Daily Lenard Lance, FNP   1 puff at 07/17/22 1108   busPIRone (BUSPAR) tablet 10 mg  10 mg Oral TID Lenard Lance, FNP   10 mg at 07/17/22 1104   citalopram (CELEXA) tablet 40 mg  40 mg Oral Daily Lenard Lance, FNP   40 mg at 07/17/22 1105   clonazePAM (KLONOPIN) tablet 0.5 mg  0.5 mg Oral BID Ajibola, Ene A, NP   0.5 mg at 07/17/22 1105   diphenhydrAMINE (BENADRYL) capsule 50 mg  50 mg Oral TID PRN Lenard Lance, FNP    50 mg at 07/16/22 1954   Or   diphenhydrAMINE (BENADRYL) injection 50 mg  50 mg Intramuscular TID PRN Lenard Lance, FNP       feeding supplement (ENSURE ENLIVE / ENSURE PLUS) liquid 237 mL  237 mL Oral BID BM Sarina Ill, DO   237 mL  at 07/16/22 1401   haloperidol (HALDOL) tablet 5 mg  5 mg Oral TID PRN Lenard Lance, FNP   5 mg at 07/16/22 1954   Or   haloperidol lactate (HALDOL) injection 5 mg  5 mg Intramuscular TID PRN Lenard Lance, FNP       levalbuterol [facility] HFA) inhaler 1-2 puff  1-2 puff Inhalation Q6H PRN Lenard Lance, FNP   2 puff at 07/16/22 1330   LORazepam (ATIVAN) tablet 2 mg  2 mg Oral TID PRN Lenard Lance, FNP   2 mg at 07/16/22 1954   Or   LORazepam (ATIVAN) injection 2 mg  2 mg Intramuscular TID PRN Lenard Lance, FNP       magnesium hydroxide (MILK OF MAGNESIA) suspension 30 mL  30 mL Oral Daily PRN Lenard Lance, FNP       multivitamin with minerals tablet 1 tablet  1 tablet Oral Daily Sarina Ill, DO   1 tablet at 07/17/22 1106   potassium chloride SA (KLOR-CON M) CR tablet 20 mEq  20 mEq Oral Daily Lenard Lance, FNP   20 mEq at 07/17/22 1104   pravastatin (PRAVACHOL) tablet 20 mg  20 mg Oral Daily Lenard Lance, FNP   20 mg at 07/17/22 1105   QUEtiapine (SEROQUEL) tablet 50 mg  50 mg Oral QHS Lenard Lance, FNP   50 mg at 07/16/22 2135   risperiDONE (RISPERDAL) tablet 0.5 mg  0.5 mg Oral [id number] Sarina Ill, DO   0.5 mg at 07/17/22 1105   traZODone (DESYREL) tablet 25-50 mg  25-50 mg Oral QHS PRN Ajibola, Ene A, NP   50 mg at 07/16/22 2203   triamterene-hydrochlorothiazide (MAXZIDE-25) 37.5-25 MG per tablet 1 tablet  1 tablet Oral Daily Lenard Lance, FNP   1 tablet at 07/17/22 1104    Lab Results:  Results for orders placed or performed during the hospital encounter of 07/15/22 (from the past 48 hour(s))  POCT Urine Drug Screen - (I-Screen)     Status: Abnormal   Collection Time: 07/15/22  6:03 PM  Result Value Ref Range    POC Amphetamine UR None Detected NONE DETECTED (Cut Off Level 1000 ng/mL)   POC Secobarbital (BAR) None Detected NONE DETECTED (Cut Off Level 300 ng/mL)   POC Buprenorphine (BUP) None Detected NONE DETECTED (Cut Off Level 10 ng/mL)   POC Oxazepam (BZO) Positive (A) NONE DETECTED (Cut Off Level 300 ng/mL)   POC Cocaine UR None Detected NONE DETECTED (Cut Off Level 300 ng/mL)   POC Methamphetamine UR None Detected NONE DETECTED (Cut Off Level 1000 ng/mL)   POC Morphine None Detected NONE DETECTED (Cut Off Level 300 ng/mL)   POC Methadone UR None Detected NONE DETECTED (Cut Off Level 300 ng/mL)   POC Oxycodone UR None Detected NONE DETECTED (Cut Off Level 100 ng/mL)   POC Marijuana UR Positive (A) NONE DETECTED (Cut Off Level 50 ng/mL)  CBC with Differential/Platelet     Status: Abnormal   Collection Time: 07/15/22  6:15 PM  Result Value Ref Range   WBC 10.3 4.0 - 10.5 K/uL   RBC 3.78 (L) 3.87 - 5.11 MIL/uL   Hemoglobin 12.0 12.0 - 15.0 g/dL   HCT 16.1 (L) 09.6 - 04.5 %   MCV 94.4 80.0 - 100.0 fL   MCH 31.7 26.0 - 34.0 pg   MCHC 33.6 30.0 - 36.0 g/dL   RDW 40.9 81.1 - 91.4 %   Platelets  244 150 - 400 K/uL   nRBC 0.0 0.0 - 0.2 %   Neutrophils Relative % 66 %   Neutro Abs 6.9 1.7 - 7.7 K/uL   Lymphocytes Relative 21 %   Lymphs Abs 2.1 0.7 - 4.0 K/uL   Monocytes Relative 9 %   Monocytes Absolute 1.0 0.1 - 1.0 K/uL   Eosinophils Relative 3 %   Eosinophils Absolute 0.3 0.0 - 0.5 K/uL   Basophils Relative 1 %   Basophils Absolute 0.1 0.0 - 0.1 K/uL   Immature Granulocytes 0 %   Abs Immature Granulocytes 0.03 0.00 - 0.07 K/uL    Comment: Performed at [facility], [address], [place], [place] [address]  Comprehensive metabolic panel     Status: Abnormal   Collection Time: 07/15/22  6:15 PM  Result Value Ref Range   Sodium 135 135 - 145 mmol/L   Potassium 3.4 (L) 3.5 - 5.1 mmol/L   Chloride 97 (L) 98 - 111 mmol/L   CO2 27 22 - 32 mmol/L   Glucose, Bld 87 70 - 99 mg/dL     Comment: Glucose reference range applies only to samples taken after fasting for at least 8 hours.   BUN 12 8 - 23 mg/dL   Creatinine, Ser 4.09 (H) 0.44 - 1.00 mg/dL   Calcium 9.7 8.9 - 81.1 mg/dL   Total Protein 6.8 6.5 - 8.1 g/dL   Albumin 4.4 3.5 - 5.0 g/dL   AST 31 15 - 41 U/L   ALT 16 0 - 44 U/L   Alkaline Phosphatase 46 38 - 126 U/L   Total Bilirubin 1.0 0.3 - 1.2 mg/dL   GFR, Estimated 38 (L) >60 mL/min    Comment: (NOTE) Calculated using the CKD-EPI Creatinine Equation (2021)    Anion gap 11 5 - 15    Comment: Performed at [facility], [address] [place], [place] [address]  Magnesium     Status: None   Collection Time: 07/15/22  6:15 PM  Result Value Ref Range   Magnesium 2.0 1.7 - 2.4 mg/dL    Comment: Performed at [facility], [address] [place], [place] [address]  Ethanol     Status: None   Collection Time: 07/15/22  6:15 PM  Result Value Ref Range   Alcohol, Ethyl (B) <10 <10 mg/dL    Comment: (NOTE) Lowest detectable limit for serum alcohol is 10 mg/dL.  For medical purposes only. Performed at [facility], [address], [place], [place] [address]   TSH     Status: None   Collection Time: 07/15/22  6:15 PM  Result Value Ref Range   TSH 0.798 0.350 - 4.500 uIU/mL    Comment: Performed by a 3rd Generation assay with a functional sensitivity of <=0.01 uIU/mL. Performed at [facility], [address] [place], [place] [address]   Urinalysis, Complete w Microscopic -Urine, Clean Catch     Status: Abnormal   Collection Time: 07/15/22  6:20 PM  Result Value Ref Range   Color, Urine YELLOW YELLOW   APPearance HAZY (A) CLEAR   Specific Gravity, Urine 1.008 1.005 - 1.030   pH 7.0 5.0 - 8.0   Glucose, UA NEGATIVE NEGATIVE mg/dL   Hgb urine dipstick SMALL (A) NEGATIVE   Bilirubin Urine NEGATIVE NEGATIVE   Ketones, ur NEGATIVE NEGATIVE mg/dL   Protein, ur NEGATIVE NEGATIVE mg/dL   Nitrite NEGATIVE NEGATIVE   Leukocytes,Ua  LARGE (A)  NEGATIVE   RBC / HPF 0-5 0 - 5 RBC/hpf   WBC, UA 0-5 0 - 5 WBC/hpf   Bacteria, UA RARE (A) NONE SEEN   Squamous Epithelial / HPF 0-5 0 - 5 /HPF    Comment: Performed at [facility], [address], [place], [place] [address]    Blood Alcohol level:  Lab Results  Component Value Date   ETH <10 07/15/2022    Metabolic Disorder Labs: Lab Results  Component Value Date   HGBA1C 5.6 05/13/2022   No results found for: "PROLACTIN" Lab Results  Component Value Date   CHOL 151 05/13/2022   TRIG 83.0 05/13/2022   HDL 73.00 05/13/2022   CHOLHDL 2 05/13/2022   VLDL 16.6 05/13/2022   LDLCALC 61 05/13/2022   LDLCALC 86 02/10/2022    Physical Findings: AIMS:  , ,  ,  ,    CIWA:    COWS:     Musculoskeletal: Strength & Muscle Tone: within normal limits Gait & Station: normal Patient leans: N/A  Psychiatric Specialty Exam:  Presentation  General Appearance:  Casual  Eye Contact: Good  Speech: Clear and Coherent  Speech Volume: Normal  Handedness: Right   Mood and Affect  Mood: Anxious  Affect: Tearful   Thought Process  Thought Processes: Coherent  Descriptions of Associations:Intact  Orientation:Full (Time, Place and Person)  Thought Content:Logical; WDL  History of Schizophrenia/Schizoaffective disorder:No  Duration of Psychotic Symptoms:No data recorded Hallucinations:Hallucinations: None  Ideas of Reference:None  Suicidal Thoughts:Suicidal Thoughts: No  Homicidal Thoughts:Homicidal Thoughts: No   Sensorium  Memory: Immediate Fair  Judgment: Intact  Insight: Present   Executive Functions  Concentration: Fair  Attention Span: Fair  Recall: Fair  Fund of Knowledge: Good  Language: Good   Psychomotor Activity  Psychomotor Activity: Psychomotor Activity: Restlessness   Assets  Assets: Communication Skills; Desire for Improvement; Financial Resources/Insurance; Resilience; Social  Support   Sleep  Sleep: Sleep: Good    Physical Exam: Physical Exam Vitals and nursing note reviewed.  Constitutional:      Appearance: Normal appearance. She is normal weight.  Neurological:     General: No focal deficit present.     Mental Status: She is alert and oriented to person, place, and time.  Psychiatric:        Mood and Affect: Mood normal.        Behavior: Behavior normal.    ROS Blood pressure (!) 130/94, pulse 94, temperature 97.9 F (36.6 C), temperature source Oral, resp. rate 18, height 5\' 2"  (1.575 m), weight 54.7 kg, SpO2 100 %. Body mass index is 22.04 kg/m.   Treatment Plan Summary: Daily contact with patient to assess and evaluate symptoms and progress in treatment, Medication management, and Plan continue current medications.  Ulla Mckiernan Tresea Mall, DO 07/17/2022, 2:26 PM

## 2022-07-17 NOTE — Progress Notes (Signed)
Patient continues to walk around the unit with a blanket wrapped around her.  RN explained to patient this was a safety hazard (fall risk.)  Patient states she is cold natured and insists she needs the blanket to stay warm.

## 2022-07-17 NOTE — Group Note (Signed)
Date:  07/17/2022 Time:  11:18 AM  Group Topic/Focus:  Goals Group:   The focus of this group is to help patients establish daily goals to achieve during treatment and discuss how the patient can incorporate goal setting into their daily lives to aide in recovery.    Participation Level:  Did Not Attend   Rodena Goldmann 07/17/2022, 11:18 AM

## 2022-07-17 NOTE — BH Assessment (Signed)
Recreation Therapy Notes  INPATIENT RECREATION THERAPY ASSESSMENT  Patient Details Name: Jennifer [name] MRN: [id number] DOB: Jan 17, 1946 Today's Date: 07/17/2022       Information Obtained From:  Patient and Chart Review  Able to Participate in Assessment/Interview: Yes  Patient Presentation: Anxious (Tearful)  Reason for Admission (Per Patient): Active Symptoms ("I couldn't breathe and it was scary for me")  Patient Stressors:  ("my breathing")  Coping Skills:    ("I put my mind on other things")  Leisure Interests (2+):  Social - Family, Community - Other (Comment), Games - Estate agent, Individual - Reading  Frequency of Recreation/Participation: Weekly  Awareness of Community Resources:  Yes  Community Resources:  Public affairs consultant ("We shop everywhere")  Current Use: Yes  Expressed Interest in [employer] Information: No  [employer] of Residence:  Designer, jewellery"  Patient Main Form of Transportation: Car  Patient Strengths:  "Kids. I use to knit and read. I am a good Mimi"  Patient Identified Areas of Improvement:  "Nothing"  Patient Goal for Hospitalization:  "to go home. I just want to be with my family"  Current SI (including self-harm):  No  Current HI:  No  Current AVH: No  Staff Intervention Plan: Group Attendance, Collaborate with Interdisciplinary Treatment Team  Consent to Intern Participation: N/A  Pt was very tearful throughout assessment. Pt reiterated that she would like to go home numerous times. Pt asked LRT "to put in a good word" so that she could go home. Pt asked what would happen if she just walked out. Pt said that at home, she uses her family to take her mind off her breathing but here she is not able to distract herself. Pt declined a stress ball, journal or activity book. Pt shared that she has a wonderful family and she would never want to leave them or hurt any of them. Pt shared that it is difficult being away from her  husband.    Vonita Calloway LRT, CTRS Sienna Stonehocker E Rodrick Payson 07/17/2022, 8:11 AM

## 2022-07-17 NOTE — Telephone Encounter (Signed)
Dr Jerold Coombe was able to speak to the patients PCP

## 2022-07-17 NOTE — Plan of Care (Signed)
New Goal as of 07/16/22  Problem: Anxiety Goal: STG - Patient will identify 3 new relaxation techniques to better manage anxiety within 5 recreation therapy group sessions Description: STG - Patient will identify 3 new relaxation techniques to better manage anxiety within 5 recreation therapy group sessions Outcome: Not Applicable

## 2022-07-17 NOTE — Progress Notes (Signed)
Patient slept in this morning after receiving PO medication for anxiety last night.   Patient appears less anxious and reports her anxiety is much better today. Denies SI, HI and AVH.  Compliant with scheduled medications.  15 min checks in place for safety.  Patient is present in the milieu and interacting with peers.   Patient able to speak with her husband on the phone.  Patient became tearful, but calmed quickly.    PRN medication given for pain in her neck.

## 2022-07-17 NOTE — Plan of Care (Signed)
  Problem: Education: Goal: Knowledge of General Education information will improve Description: Including pain rating scale, medication(s)/side effects and non-pharmacologic comfort measures Outcome: Progressing   Problem: Activity: Goal: Risk for activity intolerance will decrease Outcome: Progressing   Problem: Nutrition: Goal: Adequate nutrition will be maintained Outcome: Progressing   Problem: Coping: Goal: Level of anxiety will decrease Outcome: Progressing   

## 2022-07-17 NOTE — BH IP Treatment Plan (Signed)
Interdisciplinary Treatment and Diagnostic Plan Update  07/17/2022 Time of Session: 1:57 PM  Jennifer [name] MRN: [id number]  Principal Diagnosis: GAD (generalized anxiety disorder)  Secondary Diagnoses: Principal Problem:   GAD (generalized anxiety disorder)   Current Medications:  Current Facility-Administered Medications  Medication Dose Route Frequency Provider Last Rate Last Admin   acetaminophen (TYLENOL) tablet 650 mg  650 mg Oral Q6H PRN Lenard Lance, FNP   650 mg at 07/17/22 1419   alum & mag hydroxide-simeth (MAALOX/MYLANTA) 200-200-20 MG/5ML suspension 30 mL  30 mL Oral Q4H PRN Lenard Lance, FNP       arformoterol (BROVANA) nebulizer solution 15 mcg  15 mcg Nebulization BID Lenard Lance, FNP   15 mcg at 07/16/22 2235   And   umeclidinium bromide (INCRUSE ELLIPTA) 62.5 MCG/ACT 1 puff  1 puff Inhalation Daily Lenard Lance, FNP   1 puff at 07/17/22 1108   busPIRone (BUSPAR) tablet 10 mg  10 mg Oral TID Lenard Lance, FNP   10 mg at 07/17/22 1104   citalopram (CELEXA) tablet 40 mg  40 mg Oral Daily Lenard Lance, FNP   40 mg at 07/17/22 1105   clonazePAM (KLONOPIN) tablet 0.5 mg  0.5 mg Oral BID Ajibola, Ene A, NP   0.5 mg at 07/17/22 1105   diphenhydrAMINE (BENADRYL) capsule 50 mg  50 mg Oral TID PRN Lenard Lance, FNP   50 mg at 07/16/22 1954   Or   diphenhydrAMINE (BENADRYL) injection 50 mg  50 mg Intramuscular TID PRN Lenard Lance, FNP       feeding supplement (ENSURE ENLIVE / ENSURE PLUS) liquid 237 mL  237 mL Oral BID BM Sarina Ill, DO   237 mL at 07/16/22 1401   haloperidol (HALDOL) tablet 5 mg  5 mg Oral TID PRN Lenard Lance, FNP   5 mg at 07/16/22 1954   Or   haloperidol lactate (HALDOL) injection 5 mg  5 mg Intramuscular TID PRN Lenard Lance, FNP       levalbuterol (XOPENEX HFA) inhaler 1-2 puff  1-2 puff Inhalation Q6H PRN Lenard Lance, FNP   2 puff at 07/17/22 1515   LORazepam (ATIVAN) tablet 2 mg  2 mg Oral TID PRN Lenard Lance, FNP   2 mg at  07/16/22 1954   Or   LORazepam (ATIVAN) injection 2 mg  2 mg Intramuscular TID PRN Lenard Lance, FNP       magnesium hydroxide (MILK OF MAGNESIA) suspension 30 mL  30 mL Oral Daily PRN Lenard Lance, FNP       multivitamin with minerals tablet 1 tablet  1 tablet Oral Daily Sarina Ill, DO   1 tablet at 07/17/22 1106   potassium chloride SA (KLOR-CON M) CR tablet 20 mEq  20 mEq Oral Daily Lenard Lance, FNP   20 mEq at 07/17/22 1104   pravastatin (PRAVACHOL) tablet 20 mg  20 mg Oral Daily Lenard Lance, FNP   20 mg at 07/17/22 1105   QUEtiapine (SEROQUEL) tablet 50 mg  50 mg Oral QHS Lenard Lance, FNP   50 mg at 07/16/22 2135   risperiDONE (RISPERDAL) tablet 0.5 mg  0.5 mg Oral BH-[id number] Sarina Ill, DO   0.5 mg at 07/17/22 1105   traZODone (DESYREL) tablet 25-50 mg  25-50 mg Oral QHS PRN Ajibola, Ene A, NP   50 mg at 07/16/22 2203   triamterene-hydrochlorothiazide (MAXZIDE-25) 37.5-25  MG per tablet 1 tablet  1 tablet Oral Daily Lenard Lance, FNP   1 tablet at 07/17/22 1104   PTA Medications: Medications Prior to Admission  Medication Sig Dispense Refill Last Dose   anastrozole (ARIMIDEX) 1 MG tablet Take 1 tablet (1 mg total) by mouth daily. (Patient not taking: Reported on 07/15/2022) 30 tablet 11    busPIRone (BUSPAR) 10 MG tablet Take 1 tablet (10 mg total) by mouth 3 (three) times daily. 90 tablet 1    citalopram (CELEXA) 40 MG tablet Take 1 tablet (40 mg total) by mouth daily. 90 tablet 0    levalbuterol (XOPENEX HFA) 45 MCG/ACT inhaler Inhale 1-2 puffs into the lungs every 6 (six) hours as needed for wheezing or shortness of breath. 1 each 2    LORazepam (ATIVAN) 0.5 MG tablet Take 1 tablet (0.5 mg total) by mouth 2 (two) times daily. 60 tablet 1    potassium chloride SA (KLOR-CON M) 20 MEQ tablet Take 1 tablet (20 mEq total) by mouth daily. 14 tablet 0    pravastatin (PRAVACHOL) 20 MG tablet Take 1 tablet (20 mg total) by mouth daily. 30 tablet 5    Tiotropium  Bromide-Olodaterol (STIOLTO RESPIMAT) 2.5-2.5 MCG/ACT AERS Inhale 1 puff into the lungs daily. 4 g 2    traZODone (DESYREL) 50 MG tablet Take 0.5-1 tablets (25-50 mg total) by mouth at bedtime as needed for sleep. (Patient not taking: Reported on 07/15/2022) 30 tablet 1    triamterene-hydrochlorothiazide (MAXZIDE-25) 37.5-25 MG tablet Take 1 tablet by mouth daily. 30 tablet 5     Patient Stressors:    Patient Strengths:    Treatment Modalities: Medication Management, Group therapy, Case management,  1 to 1 session with clinician, Psychoeducation, Recreational therapy.   Physician Treatment Plan for Primary Diagnosis: GAD (generalized anxiety disorder) Long Term Goal(s): Improvement in symptoms so as ready for discharge   Short Term Goals: Ability to maintain clinical measurements within normal limits will improve Ability to demonstrate self-control will improve Ability to identify and develop effective coping behaviors will improve  Medication Management: Evaluate patient's response, side effects, and tolerance of medication regimen.  Therapeutic Interventions: 1 to 1 sessions, Unit Group sessions and Medication administration.  Evaluation of Outcomes: Not Met  Physician Treatment Plan for Secondary Diagnosis: Principal Problem:   GAD (generalized anxiety disorder)  Long Term Goal(s): Improvement in symptoms so as ready for discharge   Short Term Goals: Ability to maintain clinical measurements within normal limits will improve Ability to demonstrate self-control will improve Ability to identify and develop effective coping behaviors will improve     Medication Management: Evaluate patient's response, side effects, and tolerance of medication regimen.  Therapeutic Interventions: 1 to 1 sessions, Unit Group sessions and Medication administration.  Evaluation of Outcomes: Not Met   RN Treatment Plan for Primary Diagnosis: GAD (generalized anxiety disorder) Long Term Goal(s):  Knowledge of disease and therapeutic regimen to maintain health will improve  Short Term Goals: Ability to remain free from injury will improve, Ability to verbalize frustration and anger appropriately will improve, Ability to demonstrate self-control, Ability to participate in decision making will improve, Ability to verbalize feelings will improve, Ability to disclose and discuss suicidal ideas, Ability to identify and develop effective coping behaviors will improve, and Compliance with prescribed medications will improve  Medication Management: RN will administer medications as ordered by provider, will assess and evaluate patient's response and provide education to patient for prescribed medication. RN will report any adverse  and/or side effects to prescribing provider.  Therapeutic Interventions: 1 on 1 counseling sessions, Psychoeducation, Medication administration, Evaluate responses to treatment, Monitor vital signs and CBGs as ordered, Perform/monitor CIWA, COWS, AIMS and Fall Risk screenings as ordered, Perform wound care treatments as ordered.  Evaluation of Outcomes: Not Met   LCSW Treatment Plan for Primary Diagnosis: GAD (generalized anxiety disorder) Long Term Goal(s): Safe transition to appropriate next level of care at discharge, Engage patient in therapeutic group addressing interpersonal concerns.  Short Term Goals: Engage patient in aftercare planning with referrals and resources, Increase social support, Increase ability to appropriately verbalize feelings, Increase emotional regulation, Facilitate acceptance of mental health diagnosis and concerns, Facilitate patient progression through stages of change regarding substance use diagnoses and concerns, and Increase skills for wellness and recovery  Therapeutic Interventions: Assess for all discharge needs, 1 to 1 time with Social worker, Explore available resources and support systems, Assess for adequacy in community support  network, Educate family and significant other(s) on suicide prevention, Complete Psychosocial Assessment, Interpersonal group therapy.  Evaluation of Outcomes: Not Met   Progress in Treatment: Attending groups: Yes. and No. Participating in groups: Yes. and No. Taking medication as prescribed: Yes. Toleration medication: Yes. Family/Significant other contact made: Yes, individual(s) contacted:  pt declined  Patient understands diagnosis: Yes. Discussing patient identified problems/goals with staff: Yes. Medical problems stabilized or resolved: Yes. Denies suicidal/homicidal ideation: Yes. Issues/concerns per patient self-inventory: No. Other: None   New problem(s) identified: No, Describe:  None identified   New Short Term/Long Term Goal(s):"I want to be me again"   Patient Goals:  elimination of symptoms of psychosis, medication management for mood stabilization; elimination of SI thoughts; development of comprehensive mental wellness plan.   Discharge Plan or Barriers: CSW will assist with an appropriate discharge planning   Reason for Continuation of Hospitalization: Anxiety Depression  Estimated Length of Stay: 1 to 7 days   Last 3 [place] Suicide Severity Risk Score: Flowsheet Row Admission (Current) from 07/15/2022 in [facility] BEHAVIORAL MEDICINE Most recent reading at 07/15/2022 10:44 PM ED from 07/15/2022 in [facility] Most recent reading at 07/15/2022  3:26 PM ED from 07/08/2022 in [facility] Emergency Department at [facility] Most recent reading at 07/08/2022  3:44 PM  C-SSRS RISK CATEGORY No Risk No Risk No Risk       Last PHQ 2/9 Scores:    06/26/2022    1:20 PM 06/10/2022    4:10 PM 05/14/2022    5:01 PM  Depression screen PHQ 2/9  Decreased Interest 3 3 1   Down, Depressed, Hopeless 3 3 1   PHQ - 2 Score 6 6 2   Altered sleeping 3 3 3   Tired, decreased energy 3 3 2   Change in appetite 3 3 2   Feeling bad or failure  about yourself  3 3 0  Trouble concentrating 3 2 2   Moving slowly or fidgety/restless 3 3 0  Suicidal thoughts 1 0 0  PHQ-9 Score 25 23 11   Difficult doing work/chores Very difficult Somewhat difficult Somewhat difficult    Scribe for Treatment Team: Elza Rafter, Theresia Majors 07/17/2022 3:50 PM

## 2022-07-18 DIAGNOSIS — F411 Generalized anxiety disorder: Secondary | ICD-10-CM | POA: Diagnosis not present

## 2022-07-18 MED ORDER — MENTHOL 3 MG MT LOZG
1.0000 | LOZENGE | OROMUCOSAL | Status: DC | PRN
  Filled 2022-07-18: qty 9

## 2022-07-18 MED ORDER — WHITE PETROLATUM EX OINT
TOPICAL_OINTMENT | CUTANEOUS | Status: DC | PRN
  Filled 2022-07-18: qty 5

## 2022-07-18 NOTE — Progress Notes (Signed)
   07/18/22 1610  15 Minute Checks  Location Bedroom  Visual Appearance Calm  Behavior Sleeping  Sleep (Behavioral Health Patients Only)  Calculate sleep? (Click Yes once per 24 hr at 0600 safety check) Yes  Documented sleep last 24 hours 9.75

## 2022-07-18 NOTE — Progress Notes (Signed)
Patient has complained of neck pain all day. She was given a heat pack and a cold pack with minimal relief per her. Patient was offered tylenol but she states "it wont even touch it". MD aware. Will continue to monitor for changes.

## 2022-07-18 NOTE — Progress Notes (Signed)
[facility] MD Progress Note  07/18/2022 3:12 PM Jennifer [name]  MRN:  [id number] Subjective: Jennifer [name] is seen on rounds.  Her anxiety is better controlled.  She has been keeping her neck from sleeping awkwardly.  She says that her mood is improved.  Nurses report no issues. 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 Antony Blackbird   Hypertension    PONV (postoperative nausea and vomiting)    Scoliosis    Ulcer     Past Surgical History:  Procedure Laterality Date   ABDOMINAL HYSTERECTOMY     ANTERIOR CERVICAL DECOMP/DISCECTOMY FUSION N/A 04/19/2017   Procedure: Anterior Cervical Decompression Fusion Cervical Three-Four, Cervical Four-Five, Cervical Five-Six;  Surgeon: Donalee Citrin, MD;  Location: [facility] OR;  Service: Neurosurgery;  Laterality: N/A;  Anterior Cervical Decompression Fusion Cervical Three-Four, Cervical Four-Five, Cervical Five-Six   BREAST BIOPSY Right 11/14/2020   [place] Bx, Venus Clip, Invasive Mammary Carcinoma   BREAST LUMPECTOMY Right 2022   With radiation   BREAST LUMPECTOMY WITH RADIOACTIVE SEED AND SENTINEL LYMPH NODE BIOPSY Right 12/27/2020   Procedure: RIGHT BREAST LUMPECTOMY WITH RADIOACTIVE SEED AND AXILLARY SENTINEL LYMPH NODE BIOPSY;  Surgeon: Emelia Loron, MD;  Location: [facility] OR;  Service: General;  Laterality: Right;   CHOLECYSTECTOMY  1996   DILATION AND CURETTAGE OF UTERUS  1971   LAPAROSCOPIC REMOVAL OF MESENTERIC MASS      LUMBAR DISC SURGERY  1998   [facility]   PARTIAL HYSTERECTOMY  1981   prolapsed uterus   TUBAL LIGATION  1978   Family History:  Family History  Problem Relation Age of Onset   Stroke Mother    Cancer Father        unknown type   Cancer Brother        "blood cancer"   CVA Maternal Grandmother    Healthy Son    Breast cancer Neg Hx    Colon cancer Neg Hx    Mental illness Neg Hx    Family Psychiatric  History: Unremarkable Social History:  Social History   Substance and Sexual Activity  Alcohol Use No   Alcohol/week: 0.0 standard drinks of alcohol     Social History   Substance and Sexual Activity  Drug Use No    Social History   Socioeconomic History   Marital status: Married    Spouse name: Jennifer [name]"   Number of children: 2   Years of education: Not on file   Highest education level: 12th grade  Occupational History   Occupation: retired Runner, broadcasting/film/video  Tobacco Use   Smoking status: Never   Smokeless tobacco: Never  Vaping Use   Vaping Use: Never used  Substance and Sexual Activity   Alcohol use: No    Alcohol/week: 0.0 standard drinks of alcohol   Drug use: No   Sexual activity: Not Currently    Comment: not asked if sexually active  Other Topics Concern   Not on file  Social History Narrative   Right handed    Lives with husband   Had breast cancer surgery right side   Worked in the school system    Social Determinants of Health   Financial Resource Strain: Low Risk  (02/13/2022)   Overall Financial Resource Strain (CARDIA)    Difficulty of Paying Living Expenses: Not hard at all  Food Insecurity: No Food Insecurity (07/15/2022)   Hunger Vital Sign    Worried About Running Out of Food in the Last Year: Never true    Ran Out of Food in the Last Year: Never true  Transportation Needs: No Transportation Needs (07/15/2022)   PRAPARE - Administrator, Civil Service (Medical): No    Lack of Transportation (Non-Medical): No  Physical Activity:  Unknown (07/14/2022)   Exercise Vital Sign    Days of Exercise per Week: 0 days    Minutes of Exercise per Session: Not on file  Stress: Stress Concern Present (07/14/2022)   [employer] of Occupational Health - Occupational Stress Questionnaire    Feeling of Stress : Very much  Social Connections: Moderately Integrated (07/14/2022)   Social Connection and Isolation Panel [NHANES]    Frequency of Communication with Friends and Family: More than three times a week    Frequency of Social Gatherings with Friends and Family: More than three times a week    Attends Religious Services: 1 to 4 times per year    Active Member of [employer] or Organizations: No    Attends Engineer, structural: Not on file    Marital Status: Married   Additional Social History:                         Sleep: Good  Appetite:  Good  Current Medications: Current Facility-Administered Medications  Medication Dose Route Frequency Provider Last Rate Last Admin   acetaminophen (TYLENOL) tablet 650 mg  650 mg Oral Q6H PRN Lenard Lance, FNP   650 mg at 07/17/22 1419   alum & mag hydroxide-simeth (MAALOX/MYLANTA) 200-200-20 MG/5ML suspension 30 mL  30 mL Oral Q4H PRN Lenard Lance, FNP       arformoterol (BROVANA) nebulizer solution 15 mcg  15 mcg Nebulization BID Lenard Lance, FNP   15 mcg at 07/18/22 1001   And   umeclidinium bromide (INCRUSE ELLIPTA) 62.5 MCG/ACT 1 puff  1 puff Inhalation Daily Lenard Lance, FNP   1 puff at 07/18/22 1007   busPIRone (BUSPAR) tablet 10 mg  10 mg Oral TID Lenard Lance, FNP   10 mg at 07/18/22 0957   citalopram (CELEXA) tablet 40 mg  40 mg Oral Daily Lenard Lance, FNP   40 mg at 07/18/22 0957   clonazePAM (KLONOPIN) tablet 0.5 mg  0.5 mg Oral BID Ajibola, Ene A, NP   0.5 mg at 07/18/22 0957   diphenhydrAMINE (BENADRYL) capsule 50 mg  50 mg Oral TID PRN Lenard Lance, FNP   50 mg at 07/16/22 1954   Or   diphenhydrAMINE (BENADRYL) injection 50 mg  50 mg  Intramuscular TID PRN Lenard Lance, FNP       feeding supplement (ENSURE ENLIVE / ENSURE PLUS) liquid 237 mL  237 mL Oral BID BM Sarina Ill, DO   237 mL at 07/16/22 1401   haloperidol (HALDOL) tablet 5 mg  5 mg Oral TID PRN Doran Heater  L, FNP   5 mg at 07/16/22 1954   Or   haloperidol lactate (HALDOL) injection 5 mg  5 mg Intramuscular TID PRN Lenard Lance, FNP       levalbuterol [facility] HFA) inhaler 1-2 puff  1-2 puff Inhalation Q6H PRN Lenard Lance, FNP   1 puff at 07/18/22 1002   LORazepam (ATIVAN) tablet 2 mg  2 mg Oral TID PRN Lenard Lance, FNP   2 mg at 07/16/22 1954   Or   LORazepam (ATIVAN) injection 2 mg  2 mg Intramuscular TID PRN Lenard Lance, FNP       magnesium hydroxide (MILK OF MAGNESIA) suspension 30 mL  30 mL Oral Daily PRN Lenard Lance, FNP       multivitamin with minerals tablet 1 tablet  1 tablet Oral Daily Sarina Ill, DO   1 tablet at 07/18/22 0956   potassium chloride SA (KLOR-CON M) CR tablet 20 mEq  20 mEq Oral Daily Lenard Lance, FNP   20 mEq at 07/18/22 0956   pravastatin (PRAVACHOL) tablet 20 mg  20 mg Oral Daily Lenard Lance, FNP   20 mg at 07/18/22 0956   QUEtiapine (SEROQUEL) tablet 50 mg  50 mg Oral QHS Lenard Lance, FNP   50 mg at 07/17/22 2139   risperiDONE (RISPERDAL) tablet 0.5 mg  0.5 mg Oral BH-q8a4p Sarina Ill, DO   0.5 mg at 07/18/22 0956   traZODone (DESYREL) tablet 25-50 mg  25-50 mg Oral QHS PRN Ajibola, Ene A, NP   50 mg at 07/16/22 2203   triamterene-hydrochlorothiazide (MAXZIDE-25) 37.5-25 MG per tablet 1 tablet  1 tablet Oral Daily Lenard Lance, FNP   1 tablet at 07/18/22 0957    Lab Results: No results found for this or any previous visit (from the past 48 hour(s)).  Blood Alcohol level:  Lab Results  Component Value Date   ETH <10 07/15/2022    Metabolic Disorder Labs: Lab Results  Component Value Date   HGBA1C 5.6 05/13/2022   No results found for: "PROLACTIN" Lab Results  Component  Value Date   CHOL 151 05/13/2022   TRIG 83.0 05/13/2022   HDL 73.00 05/13/2022   CHOLHDL 2 05/13/2022   VLDL 16.6 05/13/2022   LDLCALC 61 05/13/2022   LDLCALC 86 02/10/2022    Physical Findings: AIMS:  , ,  ,  ,    CIWA:    COWS:     Musculoskeletal: Strength & Muscle Tone: within normal limits Gait & Station: normal Patient leans: N/A  Psychiatric Specialty Exam:  Presentation  General Appearance:  Casual  Eye Contact: Good  Speech: Clear and Coherent  Speech Volume: Normal  Handedness: Right   Mood and Affect  Mood: Anxious  Affect: Tearful   Thought Process  Thought Processes: Coherent  Descriptions of Associations:Intact  Orientation:Full (Time, Place and Person)  Thought Content:Logical; WDL  History of Schizophrenia/Schizoaffective disorder:No  Duration of Psychotic Symptoms:No data recorded Hallucinations:No data recorded Ideas of Reference:None  Suicidal Thoughts:No data recorded Homicidal Thoughts:No data recorded  Sensorium  Memory: Immediate Fair  Judgment: Intact  Insight: Present   Executive Functions  Concentration: Fair  Attention Span: Fair  Recall: Fair  Fund of Knowledge: Good  Language: Good   Psychomotor Activity  Psychomotor Activity:No data recorded  Assets  Assets: Communication Skills; Desire for Improvement; Financial Resources/Insurance; Resilience; Social Support   Sleep  Sleep:No data recorded    Blood pressure (!) 104/46,  pulse 66, temperature 98.3 F (36.8 C), resp. rate 18, height 5\' 2"  (1.575 m), weight 54.7 kg, SpO2 93 %. Body mass index is 22.04 kg/m.   Treatment Plan Summary: Daily contact with patient to assess and evaluate symptoms and progress in treatment, Medication management, and Plan continue current medications.  Lylla Eifler Tresea Mall, DO 07/18/2022, 3:12 PM

## 2022-07-18 NOTE — Progress Notes (Signed)
   07/18/22 2300  Psych Admission Type (Psych Patients Only)  Admission Status Voluntary  Psychosocial Assessment  Patient Complaints Anxiety;Crying spells;Nervousness;Worrying  Eye Contact Fair  Facial Expression Anxious;Sad  Affect Anxious;Sad  Speech Soft  Interaction Needy  Motor Activity Slow  Appearance/Hygiene Unremarkable  Behavior Characteristics Cooperative;Anxious  Mood Anxious;Sad  Thought Process  Coherency WDL  Content WDL  Delusions None reported or observed  Perception WDL  Hallucination None reported or observed  Judgment Impaired  Confusion None  Danger to Self  Current suicidal ideation? Denies

## 2022-07-18 NOTE — Group Note (Signed)
Date:  07/18/2022 Time:  12:49 AM  Group Topic/Focus:  Building Self Esteem:   The Focus of this group is helping patients become aware of the effects of self-esteem on their lives, the things they and others do that enhance or undermine their self-esteem, seeing the relationship between their level of self-esteem and the choices they make and learning ways to enhance self-esteem. Coping With Mental Health Crisis:   The purpose of this group is to help patients identify strategies for coping with mental health crisis.  Group discusses possible causes of crisis and ways to manage them effectively. Developing a Wellness Toolbox:   The focus of this group is to help patients develop a "wellness toolbox" with skills and strategies to promote recovery upon discharge. Goals Group:   The focus of this group is to help patients establish daily goals to achieve during treatment and discuss how the patient can incorporate goal setting into their daily lives to aide in recovery. Healthy Communication:   The focus of this group is to discuss communication, barriers to communication, as well as healthy ways to communicate with others. Identifying Needs:   The focus of this group is to help patients identify their personal needs that have been historically problematic and identify healthy behaviors to address their needs. Making Healthy Choices:   The focus of this group is to help patients identify negative/unhealthy choices they were using prior to admission and identify positive/healthier coping strategies to replace them upon discharge. Managing Feelings:   The focus of this group is to identify what feelings patients have difficulty handling and develop a plan to handle them in a healthier way upon discharge. Rediscovering Joy:   The focus of this group is to explore various ways to relieve stress in a positive manner.    Participation Level:  Active  Participation Quality:  Appropriate  Affect:   Appropriate  Cognitive:  Alert  Insight: Appropriate  Engagement in Group:  Engaged  Modes of Intervention:  Discussion  Additional Comments:    Jennifer [name] 07/18/2022, 12:49 AM

## 2022-07-18 NOTE — Progress Notes (Signed)
   07/17/22 2300  Psych Admission Type (Psych Patients Only)  Admission Status Voluntary  Psychosocial Assessment  Patient Complaints Anxiety  Eye Contact Fair  Facial Expression Anxious;Animated  Affect Appropriate to circumstance  Speech Logical/coherent  Interaction Assertive  Motor Activity Slow  Appearance/Hygiene In scrubs  Behavior Characteristics Cooperative;Appropriate to situation;Anxious  Mood Anxious  Thought Process  Coherency WDL  Content WDL  Delusions None reported or observed  Perception WDL  Hallucination None reported or observed  Judgment Impaired  Confusion None  Danger to Self  Current suicidal ideation? Denies

## 2022-07-18 NOTE — Progress Notes (Signed)
   07/18/22 0722  Psych Admission Type (Psych Patients Only)  Admission Status Voluntary  Psychosocial Assessment  Patient Complaints Anxiety;Crying spells;Depression;Nervousness;Panic attack  Eye Contact Fair  Facial Expression Anxious;Grimacing;Sad  Affect Anxious;Sullen  Speech Soft  Interaction Childlike  Motor Activity Slow  Appearance/Hygiene In scrubs  Behavior Characteristics Cooperative  Mood Anxious  Thought Process  Coherency WDL  Content WDL  Delusions None reported or observed  Perception WDL  Hallucination None reported or observed  Judgment Impaired  Confusion None  Danger to Self  Current suicidal ideation? Denies  Danger to Others  Danger to Others None reported or observed

## 2022-07-18 NOTE — Plan of Care (Signed)

## 2022-07-19 ENCOUNTER — Encounter: Payer: Self-pay | Admitting: Internal Medicine

## 2022-07-19 DIAGNOSIS — F411 Generalized anxiety disorder: Secondary | ICD-10-CM | POA: Diagnosis not present

## 2022-07-19 MED ORDER — SALINE SPRAY 0.65 % NA SOLN
1.0000 | NASAL | Status: DC | PRN
  Administered 2022-07-19: 1 via NASAL
  Filled 2022-07-19: qty 44

## 2022-07-19 MED ORDER — OXYMETAZOLINE HCL 0.05 % NA SOLN
1.0000 | Freq: Two times a day (BID) | NASAL | Status: DC
  Administered 2022-07-19 – 2022-07-20 (×2): 1 via NASAL
  Filled 2022-07-19: qty 15

## 2022-07-19 NOTE — Assessment & Plan Note (Signed)
No increased cough or congestion.  Feel her perceived sob is related to her increased anxiety.  Recent cxr ok.  Plan to treat anxiety as outlined.

## 2022-07-19 NOTE — Progress Notes (Signed)
[facility] MD Progress Note  07/19/2022 2:51 PM Jennifer [name]  MRN:  [id number] Subjective: Jennifer [name] is seen on rounds.  She states her neck is feeling better.  She says her mood is improved and her anxiety is better since starting medications.  Nurses report no issues.  She denies any side effects. 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 Antony [name]   Hypertension    PONV (postoperative nausea and vomiting)    Scoliosis    Ulcer     Past Surgical History:  Procedure Laterality Date   ABDOMINAL HYSTERECTOMY     ANTERIOR CERVICAL DECOMP/DISCECTOMY FUSION N/A 04/19/2017   Procedure: Anterior Cervical Decompression Fusion Cervical Three-Four, Cervical Four-Five, Cervical Five-Six;  Surgeon: Jennifer Citrin, MD;  Location: [facility] OR;  Service: Neurosurgery;  Laterality: N/A;  Anterior Cervical Decompression Fusion Cervical Three-Four, Cervical Four-Five, Cervical Five-Six   BREAST BIOPSY Right 11/14/2020   [place] Bx, Venus Clip, Invasive Mammary Carcinoma   BREAST LUMPECTOMY Right 2022   With radiation   BREAST LUMPECTOMY WITH RADIOACTIVE SEED AND SENTINEL LYMPH NODE BIOPSY Right 12/27/2020   Procedure: RIGHT BREAST LUMPECTOMY WITH RADIOACTIVE SEED AND AXILLARY SENTINEL LYMPH NODE BIOPSY;  Surgeon: Jennifer Loron, MD;  Location: [facility] OR;  Service: General;  Laterality: Right;   CHOLECYSTECTOMY  1996   DILATION AND CURETTAGE OF UTERUS  1971   LAPAROSCOPIC  REMOVAL OF MESENTERIC MASS     LUMBAR DISC SURGERY  1998   [facility]   PARTIAL HYSTERECTOMY  1981   prolapsed uterus   TUBAL LIGATION  1978   Family History:  Family History  Problem Relation Age of Onset   Stroke Mother    Cancer Father        unknown type   Cancer Brother        "blood cancer"   CVA Maternal Grandmother    Healthy Son    Breast cancer Neg Hx    Colon cancer Neg Hx    Mental illness Neg Hx    Family Psychiatric  History: Unremarkable Social History:  Social History   Substance and Sexual Activity  Alcohol Use No   Alcohol/week: 0.0 standard drinks of alcohol     Social History   Substance and Sexual Activity  Drug Use No    Social History   Socioeconomic History   Marital status: Married    Spouse name: Jennifer [name]"   Number of children: 2   Years of education: Not on file   Highest education level: 12th grade  Occupational History   Occupation: retired Runner, broadcasting/film/video  Tobacco Use   Smoking status: Never   Smokeless tobacco: Never  Vaping Use   Vaping Use: Never used  Substance and Sexual Activity   Alcohol use: No    Alcohol/week: 0.0 standard drinks of alcohol   Drug use: No   Sexual activity: Not Currently    Comment:  not asked if sexually active  Other Topics Concern   Not on file  Social History Narrative   Right handed    Lives with husband   Had breast cancer surgery right side   Worked in the school system    Social Determinants of Health   Financial Resource Strain: Low Risk  (02/13/2022)   Overall Financial Resource Strain (CARDIA)    Difficulty of Paying Living Expenses: Not hard at all  Food Insecurity: No Food Insecurity (07/15/2022)   Hunger Vital Sign    Worried About Running Out of Food in the Last Year: Never true    Ran Out of Food in the Last Year: Never true  Transportation Needs: No Transportation Needs (07/15/2022)   PRAPARE - Administrator, Civil Service (Medical): No    Lack of Transportation  (Non-Medical): No  Physical Activity: Unknown (07/14/2022)   Exercise Vital Sign    Days of Exercise per Week: 0 days    Minutes of Exercise per Session: Not on file  Stress: Stress Concern Present (07/14/2022)   Jennifer [name] of Occupational Health - Occupational Stress Questionnaire    Feeling of Stress : Very much  Social Connections: Moderately Integrated (07/14/2022)   Social Connection and Isolation Panel [NHANES]    Frequency of Communication with Friends and Family: More than three times a week    Frequency of Social Gatherings with Friends and Family: More than three times a week    Attends Religious Services: 1 to 4 times per year    Active Member of [employer] or Organizations: No    Attends Engineer, structural: Not on file    Marital Status: Married   Additional Social History:                         Sleep: Good  Appetite:  Good  Current Medications: Current Facility-Administered Medications  Medication Dose Route Frequency Provider Last Rate Last Admin   acetaminophen (TYLENOL) tablet 650 mg  650 mg Oral Q6H PRN Lenard Lance, FNP   650 mg at 07/19/22 1216   alum & mag hydroxide-simeth (MAALOX/MYLANTA) 200-200-20 MG/5ML suspension 30 mL  30 mL Oral Q4H PRN Lenard Lance, FNP       arformoterol (BROVANA) nebulizer solution 15 mcg  15 mcg Nebulization BID Lenard Lance, FNP   15 mcg at 07/19/22 6578   And   umeclidinium bromide (INCRUSE ELLIPTA) 62.5 MCG/ACT 1 puff  1 puff Inhalation Daily Lenard Lance, FNP   1 puff at 07/19/22 0836   busPIRone (BUSPAR) tablet 10 mg  10 mg Oral TID Lenard Lance, FNP   10 mg at 07/19/22 0904   citalopram (CELEXA) tablet 40 mg  40 mg Oral Daily Lenard Lance, FNP   40 mg at 07/19/22 0913   clonazePAM (KLONOPIN) tablet 0.5 mg  0.5 mg Oral BID Ajibola, Ene A, NP   0.5 mg at 07/19/22 0908   diphenhydrAMINE (BENADRYL) capsule 50 mg  50 mg Oral TID PRN Lenard Lance, FNP   50 mg at 07/16/22 1954   Or   diphenhydrAMINE  (BENADRYL) injection 50 mg  50 mg Intramuscular TID PRN Lenard Lance, FNP       feeding supplement (ENSURE ENLIVE / ENSURE PLUS) liquid 237 mL  237 mL Oral BID BM Sarina Ill, DO   237 mL at 07/19/22 0917   haloperidol (HALDOL) tablet 5 mg  5  mg Oral TID PRN Lenard Lance, FNP   5 mg at 07/16/22 1954   Or   haloperidol lactate (HALDOL) injection 5 mg  5 mg Intramuscular TID PRN Lenard Lance, FNP       levalbuterol [facility] HFA) inhaler 1-2 puff  1-2 puff Inhalation Q6H PRN Lenard Lance, FNP   1 puff at 07/18/22 1002   LORazepam (ATIVAN) tablet 2 mg  2 mg Oral TID PRN Lenard Lance, FNP   2 mg at 07/16/22 1954   Or   LORazepam (ATIVAN) injection 2 mg  2 mg Intramuscular TID PRN Lenard Lance, FNP       magnesium hydroxide (MILK OF MAGNESIA) suspension 30 mL  30 mL Oral Daily PRN Lenard Lance, FNP       menthol-cetylpyridinium (CEPACOL) lozenge 3 mg  1 lozenge Oral PRN Sarina Ill, DO       multivitamin with minerals tablet 1 tablet  1 tablet Oral Daily Sarina Ill, DO   1 tablet at 07/19/22 1610   potassium chloride SA (KLOR-CON M) CR tablet 20 mEq  20 mEq Oral Daily Lenard Lance, FNP   20 mEq at 07/19/22 0906   pravastatin (PRAVACHOL) tablet 20 mg  20 mg Oral Daily Lenard Lance, FNP   20 mg at 07/19/22 9604   QUEtiapine (SEROQUEL) tablet 50 mg  50 mg Oral QHS Lenard Lance, FNP   50 mg at 07/18/22 2133   risperiDONE (RISPERDAL) tablet 0.5 mg  0.5 mg Oral [id number] Sarina Ill, DO   0.5 mg at 07/19/22 5409   traZODone (DESYREL) tablet 25-50 mg  25-50 mg Oral QHS PRN Ajibola, Ene A, NP   50 mg at 07/16/22 2203   triamterene-hydrochlorothiazide (MAXZIDE-25) 37.5-25 MG per tablet 1 tablet  1 tablet Oral Daily Lenard Lance, FNP   1 tablet at 07/19/22 8119   white petrolatum (VASELINE) gel   Topical PRN Sarina Ill, DO        Lab Results: No results found for this or any previous visit (from the past 48 hour(s)).  Blood Alcohol level:   Lab Results  Component Value Date   ETH <10 07/15/2022    Metabolic Disorder Labs: Lab Results  Component Value Date   HGBA1C 5.6 05/13/2022   No results found for: "PROLACTIN" Lab Results  Component Value Date   CHOL 151 05/13/2022   TRIG 83.0 05/13/2022   HDL 73.00 05/13/2022   CHOLHDL 2 05/13/2022   VLDL 16.6 05/13/2022   LDLCALC 61 05/13/2022   LDLCALC 86 02/10/2022    Physical Findings: AIMS:  , ,  ,  ,    CIWA:    COWS:     Musculoskeletal: Strength & Muscle Tone: within normal limits Gait & Station: normal Patient leans: N/A  Psychiatric Specialty Exam:  Presentation  General Appearance:  Casual  Eye Contact: Good  Speech: Clear and Coherent  Speech Volume: Normal  Handedness: Right   Mood and Affect  Mood: Anxious  Affect: Tearful   Thought Process  Thought Processes: Coherent  Descriptions of Associations:Intact  Orientation:Full (Time, Place and Person)  Thought Content:Logical; WDL  History of Schizophrenia/Schizoaffective disorder:No  Duration of Psychotic Symptoms:No data recorded Hallucinations:No data recorded Ideas of Reference:None  Suicidal Thoughts:No data recorded Homicidal Thoughts:No data recorded  Sensorium  Memory: Immediate Fair  Judgment: Intact  Insight: Present   Executive Functions  Concentration: Fair  Attention Span: Fair  Recall: Fair  Fund of Knowledge: Good  Language: Good   Psychomotor Activity  Psychomotor Activity:No data recorded  Assets  Assets: Communication Skills; Desire for Improvement; Financial Resources/Insurance; Resilience; Social Support   Sleep  Sleep:No data recorded    Blood pressure (!) 126/39, pulse 74, temperature 98 F (36.7 C), temperature source Oral, resp. rate 18, height 5\' 2"  (1.575 m), weight 54.7 kg, SpO2 96 %. Body mass index is 22.04 kg/m.   Treatment Plan Summary: Daily contact with patient to assess and evaluate symptoms and  progress in treatment, Medication management, and Plan continue current medications.  Sarina Ill, DO 07/19/2022, 2:51 PM

## 2022-07-19 NOTE — Assessment & Plan Note (Signed)
Follow met b and A1c.  

## 2022-07-19 NOTE — Assessment & Plan Note (Signed)
Saw oncology 11/27/21 - recommended continuing taloxifen 10mg  q hs.  Also mammogram and Dexa 12/24/21.  She is offf tamoxifen now.  Desires not to take.

## 2022-07-19 NOTE — Progress Notes (Signed)
   07/19/22 0604  15 Minute Checks  Location Bedroom  Visual Appearance Calm  Behavior Sleeping  Sleep (Behavioral Health Patients Only)  Calculate sleep? (Click Yes once per 24 hr at 0600 safety check) Yes  Documented sleep last 24 hours 10.75

## 2022-07-19 NOTE — Assessment & Plan Note (Signed)
No upper symptoms reported.  Continue PPI.  ?

## 2022-07-19 NOTE — Progress Notes (Signed)
   07/19/22 1005  Psych Admission Type (Psych Patients Only)  Admission Status Voluntary  Psychosocial Assessment  Patient Complaints Anxiety  Eye Contact Fair  Facial Expression Anxious  Affect Anxious  Speech Soft  Interaction Needy  Motor Activity Slow  Appearance/Hygiene Unremarkable  Behavior Characteristics Anxious;Cooperative  Mood Anxious  Thought Process  Coherency WDL  Content WDL  Delusions None reported or observed  Perception WDL  Hallucination None reported or observed  Judgment Impaired  Confusion None  Danger to Self  Current suicidal ideation? Denies  Danger to Others  Danger to Others None reported or observed   Patient states she has nasal congestion and needs Afrin nasal spray or something else to relieve.  Patient is allergic to Fluticasone. Afrin and NS sprays ordered by MD.  Patient has made many trips to the nurses station and has made many request from staff this shift.  Patient continues on Q 15 minute checks.

## 2022-07-19 NOTE — Group Note (Signed)
LCSW Group Therapy Note   Group Date: 07/19/2022 Start Time: 1310 End Time: 1357   Type of Therapy and Topic:  Group Therapy: 7 ways to Reduce Stress  Participation Level:  Active     Summary of Patient Progress:  The patient attended group. The patient stated that after adopting because she was told that she could not have kids, she got pregnant. The patient stated that she away's wanted to have a big family. Stating that spending time with her grandchildren are a way for her to reduce stress. The patient stated that she is ready to go home.     Marshell Levan, LCSWA 07/19/2022  2:18 PM

## 2022-07-19 NOTE — Assessment & Plan Note (Signed)
Increased stress and anxiety as outlined.  Discussed at length with her and her family.  She is crying and pacing around the room.  Her anxiety has worsened over the last few days.  They feel she needs more intensive therapy.  Discussed further treatment and evaluation.  Discussed with psychiatry.  Agreeable to referral to behavioral urgent care.  Urgent care contacted and information given.  Family to drive her straight to urgent care.

## 2022-07-19 NOTE — Assessment & Plan Note (Signed)
Lungs are clear.  Recent cxr ok.  Breathing stable.

## 2022-07-19 NOTE — Assessment & Plan Note (Signed)
On pravastatin.  Low cholesterol diet and exercise.  Follow lipid panel and liver function tests.   

## 2022-07-19 NOTE — Assessment & Plan Note (Signed)
Has occurred with increased anxiety.  Did not feel remeron helped. Off now.  Treatment per psychiatry as outlined.

## 2022-07-19 NOTE — Assessment & Plan Note (Signed)
Blood pressure as outlined.  On triam/hctz.  Follow pressures.  Follow metabolic panel.  

## 2022-07-20 MED ORDER — CLONAZEPAM 0.5 MG PO TABS: 0.5000 mg | ORAL_TABLET | Freq: Two times a day (BID) | ORAL | 0 refills | Status: DC

## 2022-07-20 MED ORDER — CITALOPRAM HYDROBROMIDE 40 MG PO TABS: 40.0000 mg | ORAL_TABLET | Freq: Every day | ORAL | 0 refills | Status: DC

## 2022-07-20 MED ORDER — RISPERIDONE 0.5 MG PO TABS: 0.5000 mg | ORAL_TABLET | ORAL | 3 refills | Status: DC

## 2022-07-20 MED ORDER — QUETIAPINE FUMARATE 50 MG PO TABS: 50.0000 mg | ORAL_TABLET | Freq: Every day | ORAL | 3 refills | Status: DC

## 2022-07-20 NOTE — Progress Notes (Signed)
Patient pleasant and in the dayroom this morning.  Animated affect.  Endorses mild anxiety.  Denies depression, SI, HI and AVH. Denies pain. Compliant with scheduled medications.  15 min checks in place for safety.  Appropriate interaction with peers and staff.  Plan is for patient to discharge home with her husband today.

## 2022-07-20 NOTE — Progress Notes (Signed)
  [facility] Case Management Discharge Plan :  Will you be returning to the same living situation after discharge:  Yes,  pt will be returning to her home with her husband  At discharge, do you have transportation home?: Yes,  pt's husband will pick her up  Do you have the ability to pay for your medications: Yes,  [employer] MEDICARE / [facility] MEDICARE  Release of information consent forms completed and in the chart;  Patient's signature needed at discharge.  Patient to Follow up at:  Follow-up Information     [facility]. Go in 4 day(s).   Why: Your appointment for follow-up with Dr.Eappen is 07/24/22 at 8:30 AM. Please remember to bring your insurance card.                Next level of care provider has access to [facility]:yes  Safety Planning and Suicide Prevention discussed: Yes,  pt declined      Has patient been referred to the Quitline?: Patient does not use tobacco/nicotine products  Patient has been referred for addiction treatment: No known substance use disorder.  [address], LCSWA 07/20/2022, 10:27 AM

## 2022-07-20 NOTE — Discharge Summary (Signed)
Physician Discharge Summary Note  Patient:  Jennifer [name] is an 77 y.o., female MRN:  [id number] DOB:  Jun 12, 1945 Patient phone:  [phone] (home)  Patient address:   [address] [place] [address],  Total Time spent with patient: 1 hour  Date of Admission:  07/15/2022 Date of Discharge: 07/20/2022  Reason for Admission:    Patient seen face to face by this provider, consulted with Dr. Marlou Porch; and chart reviewed on 07/14/22. Jennifer [name], 77 y.o., female with past psychiatric history generalized anxiety disorder, panic attacks presented voluntarily to [facility] due to worsening anxiety over the past 2 months.  On chart review, patient reportedly stable on clonazepam but was switched to lorazepam about 6 months ago. Her symptoms have reportedly worsened since the medication switch. Over the past 6 weeks, she has visited the emergency department twice due to reported shortness of breath and escalating anxiety. On evaluation [name] reports worsening anxiety over the past 2 months, characterized by increased tearfulness, difficulty focusing, and decreased appetite, leading to significant weight loss. She is unable to identify specific stressors that may have contributed to her increased anxiety.  However, upon chart review, it was noted that the patient previously reported being triggered by the belief that her children are planning to place her in a care facility. Patient expresses fear and a strong desire to go home, stating that she has never been separated from her husband.   Patient denies history of suicidal self-injurious behavior, past suicide attempt or psychiatric hospitalization. She denies history of trauma, abuse or neglect. She is currently connected with outpatient psychiatry services through [facility] where she sees Dr. Elna Breslow. She states that she is compliant with medications. She is prescribed buspirone 10 mg  three times daily, lorazepam 0.5 mg twice daily, Celexa 40 mg daily. She is also prescribed trazodone 25 to 50 mg at bedtime as needed, however she reported she has not filled this medication she believes her sleep is adequate.  Patient resides in [place] with her husband. She reports that she has been married 55 years and has one son and an adoptive daughter. She reports feeling safe at home. 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  She reports a medical history of scoliosis and COPD. She identifies her support system as her spouse, neighbors, and friends.  She denies access to firearms.  Patient denies illicit substance use, with the exception of CBD Gummies that she used temporarily, recommended by her daughter for anxiety symptoms.  She denies alcohol use. BAL unremarkable. UDS + cannabinoid, oxazepam. PDMP Review revealed 1 active prescription for Lorazepam 0.5 MG Tablet: Written 06/10/22, Filled 07/10/22 (60.00).    During evaluation Jennifer [name] is seated in a chair in no acute distress. She is alert, oriented x 4, pleasant, tearful and anxious though cooperative. Speech is clear and coherent. Objectively there is no evidence of psychosis/mania or delusional thinking.  Patient is able to converse coherently, goal directed thoughts, no distractibility, or pre-occupation. She also denies suicidal/self-harm/homicidal ideation, psychosis, and paranoia. Patient answered questions appropriately.   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 Antony Blackbird   Hypertension    PONV (postoperative nausea and vomiting)    Scoliosis    Ulcer     Past Surgical History:  Procedure Laterality Date   ABDOMINAL HYSTERECTOMY     ANTERIOR CERVICAL DECOMP/DISCECTOMY FUSION N/A 04/19/2017   Procedure: Anterior Cervical Decompression Fusion Cervical Three-Four, Cervical Four-Five, Cervical Five-Six;  Surgeon: Donalee Citrin, MD;  Location: [facility] OR;  Service: Neurosurgery;  Laterality: N/A;  Anterior Cervical Decompression Fusion Cervical Three-Four, Cervical Four-Five, Cervical Five-Six   BREAST BIOPSY Right 11/14/2020   [place] Bx, Venus Clip, Invasive Mammary Carcinoma   BREAST LUMPECTOMY Right 2022   With radiation   BREAST LUMPECTOMY WITH RADIOACTIVE SEED AND SENTINEL LYMPH NODE BIOPSY Right 12/27/2020   Procedure: RIGHT BREAST LUMPECTOMY WITH RADIOACTIVE SEED AND AXILLARY SENTINEL LYMPH NODE BIOPSY;  Surgeon: Emelia Loron, MD;  Location: [facility] OR;  Service: General;  Laterality: Right;   CHOLECYSTECTOMY  1996   DILATION AND CURETTAGE OF UTERUS  1971   LAPAROSCOPIC REMOVAL OF MESENTERIC MASS     LUMBAR DISC SURGERY  1998   [facility]   PARTIAL HYSTERECTOMY  1981   prolapsed uterus   TUBAL LIGATION  1978   Family History:  Family History  Problem Relation Age of Onset   Stroke Mother    Cancer Father        unknown type   Cancer Brother        "blood cancer"   CVA Maternal Grandmother    Healthy Son    Breast cancer Neg Hx    Colon cancer Neg Hx    Mental illness Neg Hx    Family Psychiatric  History: Unremarkable Social History:  Social History   Substance and Sexual Activity  Alcohol Use No   Alcohol/week: 0.0 standard drinks of alcohol     Social History   Substance and Sexual Activity  Drug Use No    Social History   Socioeconomic History   Marital status: Married    Spouse name: Jennifer [name] "[name]"   Number of children:  2   Years of education: Not on file   Highest education level: 12th grade  Occupational History   Occupation: retired Runner, broadcasting/film/video  Tobacco Use   Smoking status: Never   Smokeless tobacco: Never  Vaping Use   Vaping Use: Never used  Substance and Sexual Activity   Alcohol use: No    Alcohol/week: 0.0 standard drinks of alcohol   Drug use: No   Sexual activity: Not Currently    Comment: not asked if sexually active  Other Topics Concern   Not on file  Social History Narrative   Right handed    Lives with husband   Had breast cancer surgery right side   Worked in the school system    Social Determinants of Health   Financial Resource Strain: Low Risk  (02/13/2022)   Overall Financial Resource Strain (CARDIA)    Difficulty of Paying Living Expenses: Not hard at all  Food Insecurity: No Food Insecurity (07/15/2022)   Hunger Vital Sign    Worried About Running Out of Food in the Last Year: Never true    Ran Out of Food in the Last Year: Never  true  Transportation Needs: No Transportation Needs (07/15/2022)   PRAPARE - Administrator, Civil Service (Medical): No    Lack of Transportation (Non-Medical): No  Physical Activity: Unknown (07/14/2022)   Exercise Vital Sign    Days of Exercise per Week: 0 days    Minutes of Exercise per Session: Not on file  Stress: Stress Concern Present (07/14/2022)   [employer] of Occupational Health - Occupational Stress Questionnaire    Feeling of Stress : Very much  Social Connections: Moderately Integrated (07/14/2022)   Social Connection and Isolation Panel [NHANES]    Frequency of Communication with Friends and Family: More than three times a week    Frequency of Social Gatherings with Friends and Family: More than three times a week    Attends Religious Services: 1 to 4 times per year    Active Member of [employer] or Organizations: No    Attends Banker Meetings: Not on file    Marital Status: Married    Hospital  Course: [place] was voluntarily admitted to inpatient psychiatry for worsening depression and anxiety.  She remained on her medications which included Celexa, BuSpar, and trazodone.  Initially she was started on Seroquel for sleep and depression, which helped but she continued to have a lot of anxiety so eventually she ended up on some Risperdal and Klonopin.  She did really well with all of the medications and her anxiety and depression improved and she asked if she can go home.  She did not have any side effects from the medications and they seem to be helping.  She does have outpatient appointments with Dr. Elna Breslow.  It was felt that she maximized hospitalization she was discharged home.  The day of discharge she denied suicidal ideation, homicidal ideation, auditory or visual hallucinations.  Her judgment and insight were good.  Physical Findings: AIMS:  , ,  ,  ,    CIWA:    COWS:     Musculoskeletal: Strength & Muscle Tone: within normal limits Gait & Station: normal Patient leans: N/A   Psychiatric Specialty Exam:  Presentation  General Appearance:  Casual  Eye Contact: Good  Speech: Clear and Coherent  Speech Volume: Normal  Handedness: Right   Mood and Affect  Mood: Anxious  Affect: Tearful   Thought Process  Thought Processes: Coherent  Descriptions of Associations:Intact  Orientation:Full (Time, Place and Person)  Thought Content:Logical; WDL  History of Schizophrenia/Schizoaffective disorder:No  Duration of Psychotic Symptoms:No data recorded Hallucinations:No data recorded Ideas of Reference:None  Suicidal Thoughts:No data recorded Homicidal Thoughts:No data recorded  Sensorium  Memory: Immediate Fair  Judgment: Intact  Insight: Present   Executive Functions  Concentration: Fair  Attention Span: Fair  Recall: Fair  Fund of Knowledge: Good  Language: Good   Psychomotor Activity  Psychomotor Activity:No data  recorded  Assets  Assets: Communication Skills; Desire for Improvement; Financial Resources/Insurance; Resilience; Social Support   Sleep  Sleep:No data recorded   Physical Exam: Physical Exam Vitals and nursing note reviewed.  Constitutional:      Appearance: Normal appearance. She is normal weight.  Neurological:     General: No focal deficit present.     Mental Status: She is alert and oriented to person, place, and time.  Psychiatric:        Attention and Perception: Attention and perception normal.        Mood and Affect: Mood and affect normal.        Speech: Speech  normal.        Behavior: Behavior normal. Behavior is cooperative.        Thought Content: Thought content normal.        Cognition and Memory: Cognition and memory normal.        Judgment: Judgment normal.    Review of Systems  Constitutional: Negative.   HENT: Negative.    Eyes: Negative.   Respiratory: Negative.    Cardiovascular: Negative.   Gastrointestinal: Negative.   Genitourinary: Negative.   Musculoskeletal: Negative.   Skin: Negative.   Neurological: Negative.   Endo/Heme/Allergies: Negative.   Psychiatric/Behavioral: Negative.     Blood pressure (!) 114/43, pulse 74, temperature 97.9 F (36.6 C), resp. rate 15, height 5\' 2"  (1.575 m), weight 54.7 kg, SpO2 99 %. Body mass index is 22.04 kg/m.   Social History   Tobacco Use  Smoking Status Never  Smokeless Tobacco Never   Tobacco Cessation:  N/A, patient does not currently use tobacco products   Blood Alcohol level:  Lab Results  Component Value Date   ETH <10 07/15/2022    Metabolic Disorder Labs:  Lab Results  Component Value Date   HGBA1C 5.6 05/13/2022   No results found for: "PROLACTIN" Lab Results  Component Value Date   CHOL 151 05/13/2022   TRIG 83.0 05/13/2022   HDL 73.00 05/13/2022   CHOLHDL 2 05/13/2022   VLDL 16.6 05/13/2022   LDLCALC 61 05/13/2022   LDLCALC 86 02/10/2022    See Psychiatric  Specialty Exam and Suicide Risk Assessment completed by Attending Physician prior to discharge.  Discharge destination:  Home  Is patient on multiple antipsychotic therapies at discharge:  Yes,   Do you recommend tapering to monotherapy for antipsychotics?  No   Has Patient had three or more failed trials of antipsychotic monotherapy by history:  No  Recommended Plan for Multiple Antipsychotic Therapies: NA   Allergies as of 07/20/2022       Reactions   Advair Diskus [fluticasone-salmeterol] Other (See Comments)   Causes blisters in her mouth   Codeine    Unknown reaction   Pollen Extract Cough        Medication List     STOP taking these medications    LORazepam 0.5 MG tablet Commonly known as: ATIVAN       TAKE these medications      Indication  anastrozole 1 MG tablet Commonly known as: ARIMIDEX Take 1 tablet (1 mg total) by mouth daily.    busPIRone 10 MG tablet Commonly known as: BUSPAR Take 1 tablet (10 mg total) by mouth 3 (three) times daily.  Indication: Anxiety Disorder, Major Depressive Disorder   citalopram 40 MG tablet Commonly known as: CELEXA Take 1 tablet (40 mg total) by mouth daily.  Indication: Generalized Anxiety Disorder, Major Depressive Disorder   clonazePAM 0.5 MG tablet Commonly known as: KLONOPIN Take 1 tablet (0.5 mg total) by mouth 2 (two) times daily.    levalbuterol 45 MCG/ACT inhaler Commonly known as: XOPENEX HFA Inhale 1-2 puffs into the lungs every 6 (six) hours as needed for wheezing or shortness of breath.    potassium chloride SA 20 MEQ tablet Commonly known as: KLOR-CON M Take 1 tablet (20 mEq total) by mouth daily.    pravastatin 20 MG tablet Commonly known as: PRAVACHOL Take 1 tablet (20 mg total) by mouth daily.    QUEtiapine 50 MG tablet Commonly known as: SEROQUEL Take 1 tablet (50 mg total) by mouth at bedtime.  Indication: Generalized Anxiety Disorder, Trouble Sleeping   risperiDONE 0.5 MG  tablet Commonly known as: RISPERDAL Take 1 tablet (0.5 mg total) by mouth 2 (two) times daily at 8 am and 4 pm.  Indication: Major Depressive Disorder   Stiolto Respimat 2.5-2.5 MCG/ACT Aers Generic drug: Tiotropium Bromide-Olodaterol Inhale 1 puff into the lungs daily.    traZODone 50 MG tablet Commonly known as: DESYREL Take 0.5-1 tablets (25-50 mg total) by mouth at bedtime as needed for sleep.    triamterene-hydrochlorothiazide 37.5-25 MG tablet Commonly known as: MAXZIDE-25 Take 1 tablet by mouth daily.         Follow-up Information     [facility] Go in 4 day(s).   Why: Your appointment for follow-up with Dr.Eappen is 07/24/22 at 8:30 AM. Please remember to bring your insurance card.        [facility] Follow up.   Specialty: Professional Counselor Why: Walk-In hours are Monday - Friday: 8:30 a.m.-12 p.m. / 1-2:30 p.m. Please remember to bring your insurance card. Contact information: [facility] [place] [address] [place] [address] [phone]                 Follow-up recommendations:  Dr. Elna Breslow    Signed: Sarina Ill, DO 07/20/2022, 12:58 PM

## 2022-07-20 NOTE — Care Management Important Message (Signed)
Important Message  Patient Details  Name: Jennifer [name] MRN: [id number] Date of Birth: [birth year]   Medicare Important Message Given:  CSW Medicare IM given to pt, pt requested it be put in discharge package so she does not leave it.       [address] LCSWA 07/20/2022, 11:49 AM

## 2022-07-20 NOTE — BHH Suicide Risk Assessment (Signed)
[facility] Discharge Suicide Risk Assessment   Principal Problem: GAD (generalized anxiety disorder) Discharge Diagnoses: Principal Problem:   GAD (generalized anxiety disorder)   Total Time spent with patient: 1 hour  Musculoskeletal: Strength & Muscle Tone: within normal limits Gait & Station: normal Patient leans: N/A  Psychiatric Specialty Exam  Presentation  General Appearance:  Casual  Eye Contact: Good  Speech: Clear and Coherent  Speech Volume: Normal  Handedness: Right   Mood and Affect  Mood: Anxious  Duration of Depression Symptoms: Greater than two weeks  Affect: Tearful   Thought Process  Thought Processes: Coherent  Descriptions of Associations:Intact  Orientation:Full (Time, Place and Person)  Thought Content:Logical; WDL  History of Schizophrenia/Schizoaffective disorder:No  Duration of Psychotic Symptoms:No data recorded Hallucinations:No data recorded Ideas of Reference:None  Suicidal Thoughts:No data recorded Homicidal Thoughts:No data recorded  Sensorium  Memory: Immediate Fair  Judgment: Intact  Insight: Present   Executive Functions  Concentration: Fair  Attention Span: Fair  Recall: Fair  Fund of Knowledge: Good  Language: Good   Psychomotor Activity  Psychomotor Activity:No data recorded  Assets  Assets: Communication Skills; Desire for Improvement; Financial Resources/Insurance; Resilience; Social Support   Sleep  Sleep:No data recorded   Blood pressure (!) 114/43, pulse 74, temperature 97.9 F (36.6 C), resp. rate 15, height 5\' 2"  (1.575 m), weight 54.7 kg, SpO2 99 %. Body mass index is 22.04 kg/m.  Mental Status Per Nursing Assessment::   On Admission:  NA  Demographic Factors:  NA  Loss Factors: NA  Historical Factors: NA  Risk Reduction Factors:   NA  Continued Clinical Symptoms:  Severe Anxiety and/or Agitation  Cognitive Features That Contribute To Risk:  None     Suicide Risk:  Minimal: No identifiable suicidal ideation.  Patients presenting with no risk factors but with morbid ruminations; may be classified as minimal risk based on the severity of the depressive symptoms   Follow-up Information     [facility]. Go in 4 day(s).   Why: Your appointment for follow-up with Dr.Eappen is 07/24/22 at 8:30 AM. Please remember to bring your insurance card.        [facility] Follow up.   Specialty: Professional Counselor Why: Walk-In hours are Monday - Friday: 8:30 a.m.-12 p.m. / 1-2:30 p.m. Please remember to bring your insurance card. Contact information: [facility] [address] [place] [address] [phone]                 Plan Of Care/Follow-up recommendations: Dr. Laural Golden, DO 07/20/2022, 12:51 PM

## 2022-07-20 NOTE — Progress Notes (Signed)
Discharge Note:  Patient denies SI/HI/AVH at this time. Discharge instructions, AVS, prescriptions, and transition record reviewed with patient. Patient agrees to comply with medication management, follow-up visit, and outpatient therapy. Patient belongings returned to patient. Patient questions and concerns addressed and answered. Patient ambulatory off unit. Patient discharged to home with her daughter and son.

## 2022-07-20 NOTE — Progress Notes (Signed)
Patient compliant with medications Denies SI/HI/A/VH and verbally contracts for safety has been visible in [place] this shift. Patient in bed sleeping respirations noted on 2L Oxygen via nasal canula. Q 15 minutes safety checks ongoing Patient remains safe.

## 2022-07-20 NOTE — BHH Counselor (Addendum)
CSW spoke with pt regarding f/u appointment for therapy. Pt has an appt for medication management with Dr.Eappen on 07/24/22 but currently declines f/u therapy appt. CSW put walk-in hours for [facility] [place] if pt wants outpatient therapy.   Reynaldo Minium, MSW, [place] 07/20/2022 10:38 AM

## 2022-07-20 NOTE — Group Note (Signed)
Date:  07/20/2022 Time:  3:36 AM  Group Topic/Focus:  Emotional Education:   The focus of this group is to discuss what feelings/emotions are, and how they are experienced.    Participation Level:  Active  Participation Quality:  Appropriate  Affect:  Appropriate  Cognitive:  Appropriate  Insight: Appropriate  Engagement in Group:  Engaged  Modes of Intervention:  Activity and Education  Additional Comments:    Osker Mason 07/20/2022, 3:36 AM

## 2022-07-20 NOTE — Group Note (Signed)
[facility] LCSW Group Therapy Note    Group Date: 07/20/2022 Start Time: 1300 End Time: 1350  Type of Therapy and Topic:  Group Therapy:  Overcoming Obstacles  Participation Level:  BHH PARTICIPATION LEVEL: Minimal  Mood:  Description of Group:   In this group patients will be encouraged to explore what they see as obstacles to their own wellness and recovery. They will be guided to discuss their thoughts, feelings, and behaviors related to these obstacles. The group will process together ways to cope with barriers, with attention given to specific choices patients can make. Each patient will be challenged to identify changes they are motivated to make in order to overcome their obstacles. This group will be process-oriented, with patients participating in exploration of their own experiences as well as giving and receiving support and challenge from other group members.  Therapeutic Goals: 1. Patient will identify personal and current obstacles as they relate to admission. 2. Patient will identify barriers that currently interfere with their wellness or overcoming obstacles.  3. Patient will identify feelings, thought process and behaviors related to these barriers. 4. Patient will identify two changes they are willing to make to overcome these obstacles:    Summary of Patient Progress Patient was present in group.  Patient discussed how having a support system is important while in group.  Patient arrived late, however, was attentive and supportive throughout group.   Therapeutic Modalities:   Cognitive Behavioral Therapy Solution Focused Therapy Motivational Interviewing Relapse Prevention Therapy   [name], LCSW

## 2022-07-20 NOTE — Telephone Encounter (Signed)
It appears that the appt has been canceled and she has a hospital follow up scheduled.

## 2022-07-20 NOTE — Plan of Care (Signed)

## 2022-07-21 ENCOUNTER — Other Ambulatory Visit: Payer: Medicare Other

## 2022-07-21 ENCOUNTER — Telehealth: Payer: Self-pay | Admitting: *Deleted

## 2022-07-21 ENCOUNTER — Telehealth: Payer: Self-pay | Admitting: Psychiatry

## 2022-07-21 NOTE — Transitions of Care (Post Inpatient/ED Visit) (Signed)
   07/21/2022  Name: Jennifer [name] MRN: [id number] DOB: Mar 13, 1945  Today's TOC FU Call Status: Today's TOC FU Call Status:: Unsuccessul Call (1st Attempt) Unsuccessful Call (1st Attempt) Date: 07/21/22  Attempted to reach the patient regarding the most recent Inpatient/ED visit.  Follow Up Plan: Additional outreach attempts will be made to reach the patient to complete the Transitions of Care (Post Inpatient/ED visit) call.   Gean Maidens BSN RN [facility] [phone]

## 2022-07-21 NOTE — Telephone Encounter (Signed)
Patient was just released from inpatient behavioral health admission-geriatric psychiatric unit.  Patient is a hospital discharge and needs to be seen soon.  However since family contacted and canceled the appointment with this provider , will have staff place this patient on a wait list for sooner visit,if any other appointments open soon.

## 2022-07-21 NOTE — Telephone Encounter (Signed)
Patient was just released from [facility] and was scheduled for a follow up on July 5 but had to cancel. Patient's daughter stated patient is doing better. Patient will come on August 21 and is on wait list.

## 2022-07-22 ENCOUNTER — Ambulatory Visit: Payer: Medicare Other | Admitting: Internal Medicine

## 2022-07-24 ENCOUNTER — Ambulatory Visit: Payer: BC Managed Care – PPO | Admitting: Psychiatry

## 2022-07-24 ENCOUNTER — Telehealth: Payer: Self-pay | Admitting: *Deleted

## 2022-07-24 NOTE — Transitions of Care (Post Inpatient/ED Visit) (Signed)
   07/24/2022  Name: ZYARIA MANGIAPANE MRN: [id number] DOB: [birth year]  Today's TOC FU Call Status: Today's TOC FU Call Status:: Unsuccessful Call (2nd Attempt) Unsuccessful Call (2nd Attempt) Date: 07/24/22  Attempted to reach the patient regarding the most recent Inpatient/ED visit.  Follow Up Plan: Additional outreach attempts will be made to reach the patient to complete the Transitions of Care (Post Inpatient/ED visit) call.   Gean Maidens BSN RN [facility] [phone]

## 2022-07-27 ENCOUNTER — Telehealth: Payer: Self-pay

## 2022-07-27 DIAGNOSIS — F411 Generalized anxiety disorder: Secondary | ICD-10-CM

## 2022-07-27 MED ORDER — CLONAZEPAM 0.5 MG PO TABS: 0.5000 mg | ORAL_TABLET | Freq: Two times a day (BID) | ORAL | 1 refills | Status: DC

## 2022-07-27 NOTE — Telephone Encounter (Signed)
daughter was notified that rx was sent to the pharmacy

## 2022-07-27 NOTE — Telephone Encounter (Signed)
I have some clonazepam refills to pharmacy at [facility] as requested.

## 2022-07-27 NOTE — Telephone Encounter (Signed)
pt daughter called she states that they are going to be leaving for vaccation and she needs a rx for the clonazepam she will not have enough to get to the next appt.

## 2022-07-29 ENCOUNTER — Telehealth: Payer: Self-pay

## 2022-07-29 ENCOUNTER — Encounter: Payer: Self-pay | Admitting: Internal Medicine

## 2022-07-29 ENCOUNTER — Ambulatory Visit (INDEPENDENT_AMBULATORY_CARE_PROVIDER_SITE_OTHER): Payer: Medicare Other | Admitting: Internal Medicine

## 2022-07-29 VITALS — BP 112/70 | HR 67 | Temp 97.8°F | Resp 16 | Ht 61.5 in | Wt 127.0 lb

## 2022-07-29 DIAGNOSIS — K2100 Gastro-esophageal reflux disease with esophagitis, without bleeding: Secondary | ICD-10-CM

## 2022-07-29 DIAGNOSIS — J4520 Mild intermittent asthma, uncomplicated: Secondary | ICD-10-CM | POA: Diagnosis not present

## 2022-07-29 DIAGNOSIS — R739 Hyperglycemia, unspecified: Secondary | ICD-10-CM | POA: Diagnosis not present

## 2022-07-29 DIAGNOSIS — Z170 Estrogen receptor positive status [ER+]: Secondary | ICD-10-CM | POA: Diagnosis not present

## 2022-07-29 DIAGNOSIS — I10 Essential (primary) hypertension: Secondary | ICD-10-CM

## 2022-07-29 DIAGNOSIS — C50411 Malignant neoplasm of upper-outer quadrant of right female breast: Secondary | ICD-10-CM

## 2022-07-29 DIAGNOSIS — E7800 Pure hypercholesterolemia, unspecified: Secondary | ICD-10-CM

## 2022-07-29 DIAGNOSIS — F419 Anxiety disorder, unspecified: Secondary | ICD-10-CM | POA: Diagnosis not present

## 2022-07-29 DIAGNOSIS — J984 Other disorders of lung: Secondary | ICD-10-CM

## 2022-07-29 NOTE — Patient Outreach (Signed)
Opened in error

## 2022-07-29 NOTE — Transitions of Care (Post Inpatient/ED Visit) (Signed)
   07/29/2022  Name: Jennifer [name] MRN: [id number] DOB: Jul 03, 1945  Today's TOC FU Call Status: Today's TOC FU Call Status:: Unsuccessful Call (3rd Attempt) Unsuccessful Call (3rd Attempt) Date: 07/29/22  Attempted to reach the patient regarding the most recent Inpatient/ED visit.  Follow Up Plan: No further outreach attempts will be made at this time. We have been unable to contact the patient.  Jodelle Gross, RN, BSN, CCM Care Management Coordinator [facility]

## 2022-07-29 NOTE — Progress Notes (Signed)
Subjective:    [name], female    DOB: 11-26-1945, 77 y.o.   MRN: [id number]  Patient here for  Chief Complaint  Patient presents with   Hospitalization Follow-up    HPI Here for hospital follow up. Admitted 07/15/22 - 07/20/22 - after presenting with increased anxiety. She remained on celexa, buspar and trazodone. Trial of seroquel.  With continued issues, medication changed to risperdal and klonopin. Mood stabilized.  Anxiety improved.  Discharged home. Per discharge summary - on buspar 10mg  tid, citalopram, seroquel, risperdal, and clonazepam.  She is accompanied by her husband.  History obtained from both of them.  She is doing better.  Feeling better.  Anxiety better.  Eating better.  Breathing stable. Discussed possibility of pill packs to help regulate her medications.  Her daughter is preparing her medications, so will clarify with her exactly what she is taking.    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 Antony Blackbird   Hypertension    PONV (postoperative nausea and vomiting)    Scoliosis    Ulcer    Past Surgical History:  Procedure Laterality Date   ABDOMINAL HYSTERECTOMY     ANTERIOR CERVICAL DECOMP/DISCECTOMY FUSION N/A 04/19/2017   Procedure: Anterior Cervical Decompression Fusion Cervical Three-Four, Cervical Four-Five, Cervical Five-Six;  Surgeon: Donalee Citrin, MD;  Location: [facility] OR;  Service: Neurosurgery;  Laterality: N/A;  Anterior Cervical Decompression Fusion Cervical Three-Four, Cervical Four-Five, Cervical Five-Six   BREAST BIOPSY Right 11/14/2020   [place] Bx, Venus Clip, Invasive Mammary Carcinoma   BREAST LUMPECTOMY Right 2022   With  radiation   BREAST LUMPECTOMY WITH RADIOACTIVE SEED AND SENTINEL LYMPH NODE BIOPSY Right 12/27/2020   Procedure: RIGHT BREAST LUMPECTOMY WITH RADIOACTIVE SEED AND AXILLARY SENTINEL LYMPH NODE BIOPSY;  Surgeon: Emelia Loron, MD;  Location: [facility] OR;  Service: General;  Laterality: Right;   CHOLECYSTECTOMY  1996   DILATION AND CURETTAGE OF UTERUS  1971   LAPAROSCOPIC REMOVAL OF MESENTERIC MASS     LUMBAR DISC SURGERY  1998   [facility]   PARTIAL HYSTERECTOMY  1981   prolapsed uterus   TUBAL LIGATION  1978   Family History  Problem Relation Age of Onset   Stroke Mother    Cancer Father        unknown type   Cancer Brother        "blood cancer"   CVA Maternal Grandmother    Healthy Son    Breast cancer Neg Hx    Colon cancer Neg Hx    Mental illness Neg Hx    Social History   Socioeconomic History   Marital status: Married    Spouse name: Mariana Kaufman "[name]"   Number of children: 2   Years of education: Not on file   Highest education level: 12th grade  Occupational History   Occupation: retired Runner, broadcasting/film/video  Tobacco Use   Smoking status: Never   Smokeless tobacco: Never  Vaping Use   Vaping status: Never Used  Substance and Sexual Activity   Alcohol use: No    Alcohol/week: 0.0 standard drinks  of alcohol   Drug use: No   Sexual activity: Not Currently    Comment: not asked if sexually active  Other Topics Concern   Not on file  Social History Narrative   Right handed    Lives with husband   Had breast cancer surgery right side   Worked in the school system    Social Determinants of Health   Financial Resource Strain: Low Risk  (02/13/2022)   Overall Financial Resource Strain (CARDIA)    Difficulty of Paying Living Expenses: Not hard at all  Food Insecurity: No Food Insecurity (07/15/2022)   Hunger Vital Sign    Worried About Running Out of Food in the Last Year: Never true    Ran Out of Food in the Last Year: Never true  Transportation Needs: No Transportation Needs  (07/15/2022)   PRAPARE - Administrator, Civil Service (Medical): No    Lack of Transportation (Non-Medical): No  Physical Activity: Unknown (07/14/2022)   Exercise Vital Sign    Days of Exercise per Week: 0 days    Minutes of Exercise per Session: Not on file  Stress: Stress Concern Present (07/14/2022)   [employer] of Occupational Health - Occupational Stress Questionnaire    Feeling of Stress : Very much  Social Connections: Moderately Integrated (07/14/2022)   Social Connection and Isolation Panel [NHANES]    Frequency of Communication with Friends and Family: More than three times a week    Frequency of Social Gatherings with Friends and Family: More than three times a week    Attends Religious Services: 1 to 4 times per year    Active Member of [employer] or Organizations: No    Attends Engineer, structural: Not on file    Marital Status: Married     Review of Systems  Constitutional:  Negative for appetite change and unexpected weight change.  HENT:  Negative for congestion and sinus pressure.   Respiratory:  Negative for cough, chest tightness and shortness of breath.   Cardiovascular:  Negative for chest pain, palpitations and leg swelling.  Gastrointestinal:  Negative for abdominal pain, diarrhea, nausea and vomiting.  Genitourinary:  Negative for difficulty urinating and dysuria.  Musculoskeletal:  Negative for joint swelling and myalgias.  Skin:  Negative for color change and rash.  Neurological:  Negative for dizziness and headaches.  Psychiatric/Behavioral:         Anxiety and depression improved.        Objective:     BP 112/70   Pulse 67   Temp 97.8 F (36.6 C)   Resp 16   Ht 5' 1.5" (1.562 m)   Wt 127 lb (57.6 kg)   SpO2 98%   BMI 23.61 kg/m  Wt Readings from Last 3 Encounters:  07/29/22 127 lb (57.6 kg)  07/15/22 119 lb 6.4 oz (54.2 kg)  07/08/22 122 lb 5.7 oz (55.5 kg)    Physical Exam Vitals reviewed.  Constitutional:       General: She is not in acute distress.    Appearance: Normal appearance.  HENT:     Head: Normocephalic and atraumatic.     Right Ear: External ear normal.     Left Ear: External ear normal.  Eyes:     General: No scleral icterus.       Right eye: No discharge.        Left eye: No discharge.     Conjunctiva/sclera: Conjunctivae normal.  Neck:  Thyroid: No thyromegaly.  Cardiovascular:     Rate and Rhythm: Normal rate and regular rhythm.  Pulmonary:     Effort: No respiratory distress.     Breath sounds: Normal breath sounds. No wheezing.  Abdominal:     General: Bowel sounds are normal.     Palpations: Abdomen is soft.     Tenderness: There is no abdominal tenderness.  Musculoskeletal:        General: No swelling or tenderness.     Cervical back: Neck supple. No tenderness.  Lymphadenopathy:     Cervical: No cervical adenopathy.  Skin:    Findings: No erythema or rash.  Neurological:     Mental Status: She is alert.  Psychiatric:        Mood and Affect: Mood normal.        Behavior: Behavior normal.      Outpatient Encounter Medications as of 07/29/2022  Medication Sig   anastrozole (ARIMIDEX) 1 MG tablet Take 1 tablet (1 mg total) by mouth daily. (Patient not taking: Reported on 07/15/2022)   busPIRone (BUSPAR) 10 MG tablet Take 1 tablet (10 mg total) by mouth 3 (three) times daily.   citalopram (CELEXA) 40 MG tablet Take 1 tablet (40 mg total) by mouth daily.   clonazePAM (KLONOPIN) 0.5 MG tablet Take 1 tablet (0.5 mg total) by mouth 2 (two) times daily.   levalbuterol (XOPENEX HFA) 45 MCG/ACT inhaler Inhale 1-2 puffs into the lungs every 6 (six) hours as needed for wheezing or shortness of breath.   potassium chloride SA (KLOR-CON M) 20 MEQ tablet Take 1 tablet (20 mEq total) by mouth daily.   pravastatin (PRAVACHOL) 20 MG tablet Take 1 tablet (20 mg total) by mouth daily.   QUEtiapine (SEROQUEL) 50 MG tablet Take 1 tablet (50 mg total) by mouth at bedtime.    risperiDONE (RISPERDAL) 0.5 MG tablet Take 1 tablet (0.5 mg total) by mouth 2 (two) times daily at 8 am and 4 pm.   Tiotropium Bromide-Olodaterol (STIOLTO RESPIMAT) 2.5-2.5 MCG/ACT AERS Inhale 1 puff into the lungs daily.   triamterene-hydrochlorothiazide (MAXZIDE-25) 37.5-25 MG tablet Take 1 tablet by mouth daily.   [DISCONTINUED] traZODone (DESYREL) 50 MG tablet Take 0.5-1 tablets (25-50 mg total) by mouth at bedtime as needed for sleep. (Patient not taking: Reported on 07/15/2022)   No facility-administered encounter medications on file as of 07/29/2022.    Reviewed and reconciled medications with pt and pts daughter.  Discussed possibly arranging - pill packs.   Lab Results  Component Value Date   WBC 10.3 07/15/2022   HGB 12.0 07/15/2022   HCT 35.7 (L) 07/15/2022   PLT 244 07/15/2022   GLUCOSE 87 07/15/2022   CHOL 151 05/13/2022   TRIG 83.0 05/13/2022   HDL 73.00 05/13/2022   LDLDIRECT 148.4 11/03/2012   LDLCALC 61 05/13/2022   ALT 16 07/15/2022   AST 31 07/15/2022   NA 135 07/15/2022   K 3.4 (L) 07/15/2022   CL 97 (L) 07/15/2022   CREATININE 1.45 (H) 07/15/2022   BUN 12 07/15/2022   CO2 27 07/15/2022   TSH 0.798 07/15/2022   HGBA1C 5.6 05/13/2022    No results found.     Assessment & Plan:  Anxiety Assessment & Plan: Admitted 07/15/22 - 07/20/22 - after presenting with increased anxiety. She remained on celexa, buspar and trazodone. Trial of seroquel.  With continued issues, medication changed to risperdal and klonopin. Mood stabilized.  Anxiety improved.  Discharged home. Per discharge summary - on  buspar 10mg  tid, citalopram, seroquel, risperdal, and clonazepam.  She is accompanied by her husband.  History obtained from both of them.  She is doing better.  Feeling better.  Anxiety better.  Eating better.  Breathing stable. Discussed possibility of pill packs to help regulate her medications.    Mild intermittent asthma without complication Assessment & Plan: Lungs are  clear.  Recent cxr ok.  Breathing stable.     Chronic restrictive lung disease Assessment & Plan: Breathing stable.    Essential hypertension, benign Assessment & Plan: Blood pressure as outlined.  On triam/hctz.  Follow pressures.  Follow metabolic panel.    Gastroesophageal reflux disease with esophagitis without hemorrhage Assessment & Plan: No upper symptoms reported.  Continue PPI.    Hypercholesterolemia Assessment & Plan: On pravastatin.  Low cholesterol diet and exercise.  Follow lipid panel and liver function tests.     Hyperglycemia Assessment & Plan: Follow met b and A1c.    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female, estrogen receptor positive [facility]) Assessment & Plan: Saw oncology 11/27/21 - recommended continuing taloxifen 10mg  q hs.  Also mammogram and Dexa 12/24/21.  She is offf tamoxifen now.  Desires not to take.       Dale Barnett, MD

## 2022-08-03 ENCOUNTER — Encounter: Payer: Self-pay | Admitting: Internal Medicine

## 2022-08-03 NOTE — Assessment & Plan Note (Signed)
 Breathing stable.

## 2022-08-03 NOTE — Assessment & Plan Note (Signed)
 On pravastatin.  Low cholesterol diet and exercise.  Follow lipid panel and liver function tests.   

## 2022-08-03 NOTE — Assessment & Plan Note (Signed)
No upper symptoms reported.  Continue PPI.  

## 2022-08-03 NOTE — Assessment & Plan Note (Signed)
Admitted 07/15/22 - 07/20/22 - after presenting with increased anxiety. She remained on celexa, buspar and trazodone. Trial of seroquel.  With continued issues, medication changed to risperdal and klonopin. Mood stabilized.  Anxiety improved.  Discharged home. Per discharge summary - on buspar 10mg  tid, citalopram, seroquel, risperdal, and clonazepam.  She is accompanied by her husband.  History obtained from both of them.  She is doing better.  Feeling better.  Anxiety better.  Eating better.  Breathing stable. Discussed possibility of pill packs to help regulate her medications.

## 2022-08-03 NOTE — Assessment & Plan Note (Signed)
 Follow met b and A1c.  

## 2022-08-03 NOTE — Assessment & Plan Note (Signed)
Blood pressure as outlined.  On triam/hctz.  Follow pressures.  Follow metabolic panel.  

## 2022-08-03 NOTE — Assessment & Plan Note (Signed)
Saw oncology 11/27/21 - recommended continuing taloxifen 10mg  q hs.  Also mammogram and Dexa 12/24/21.  She is offf tamoxifen now.  Desires not to take.

## 2022-08-03 NOTE — Assessment & Plan Note (Signed)
Lungs are clear.  Recent cxr ok.  Breathing stable.

## 2022-08-04 ENCOUNTER — Institutional Professional Consult (permissible substitution): Payer: Medicare Other | Admitting: Student in an Organized Health Care Education/Training Program

## 2022-08-10 ENCOUNTER — Encounter: Payer: Self-pay | Admitting: Student in an Organized Health Care Education/Training Program

## 2022-08-10 ENCOUNTER — Ambulatory Visit: Payer: Medicare Other | Admitting: Student in an Organized Health Care Education/Training Program

## 2022-08-10 VITALS — BP 122/76 | HR 77 | Temp 98.1°F | Ht 61.0 in | Wt 128.0 lb

## 2022-08-10 DIAGNOSIS — R0602 Shortness of breath: Secondary | ICD-10-CM

## 2022-08-10 DIAGNOSIS — J984 Other disorders of lung: Secondary | ICD-10-CM | POA: Diagnosis not present

## 2022-08-10 DIAGNOSIS — M419 Scoliosis, unspecified: Secondary | ICD-10-CM

## 2022-08-10 MED ORDER — STIOLTO RESPIMAT 2.5-2.5 MCG/ACT IN AERS
2.0000 | INHALATION_SPRAY | Freq: Every day | RESPIRATORY_TRACT | 11 refills | Status: DC
Start: 2022-08-10 — End: 2023-01-21

## 2022-08-10 MED ORDER — ALBUTEROL SULFATE HFA 108 (90 BASE) MCG/ACT IN AERS
2.0000 | INHALATION_SPRAY | Freq: Four times a day (QID) | RESPIRATORY_TRACT | 2 refills | Status: DC | PRN
Start: 2022-08-10 — End: 2023-01-21

## 2022-08-10 NOTE — Progress Notes (Signed)
Assessment & Plan:   1. Shortness of breath 2. 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  She was previously switched from Spiriva HandiHaler to [employer] Respimat by pulmonology at [facility] and has had improvement with this regimen.  Today, I will refill her Stiolto and have asked her to take it 2 puffs once daily.  I will also add albuterol to be used as needed.  For follow-up of her restrictive lung disease, I will obtain a pulmonary function test to assess for any interval changes and compared to the previous pulmonary function test at [facility].  As to her shortness of breath, I did discuss with her that she could benefit from attempting a course of pulm rehabilitation  - albuterol (VENTOLIN HFA) 108 (90 Base) MCG/ACT inhaler; Inhale 2 puffs into the lungs every 6 (six) hours as needed for wheezing or shortness of breath.  Dispense: 8 g; Refill: 2 - Pulmonary Function Test [facility] Only; Future - AMB referral to pulmonary rehabilitation - Tiotropium Bromide-Olodaterol (STIOLTO RESPIMAT) 2.5-2.5 MCG/ACT AERS; Inhale 2 puffs into the lungs daily.  Dispense: 4 g; Refill: 11   Return in about 3 months (around 11/10/2022).  I spent 60 minutes caring for this patient today, including preparing to see the patient, obtaining a medical history , reviewing a separately obtained history, performing a medically appropriate examination and/or evaluation, counseling and educating the patient/family/caregiver, ordering medications, tests, or procedures, documenting clinical information in the electronic health record, and independently interpreting results (not separately reported/billed) and communicating results to the patient/family/caregiver  Jennifer Chute, MD [facility] 08/10/2022 9:41 AM    End of visit medications:  Meds ordered this  encounter  Medications   albuterol (VENTOLIN HFA) 108 (90 Base) MCG/ACT inhaler    Sig: Inhale 2 puffs into the lungs every 6 (six) hours as needed for wheezing or shortness of breath.    Dispense:  8 g    Refill:  2   Tiotropium Bromide-Olodaterol (STIOLTO RESPIMAT) 2.5-2.5 MCG/ACT AERS    Sig: Inhale 2 puffs into the lungs daily.    Dispense:  4 g    Refill:  11     Current Outpatient Medications:    albuterol (VENTOLIN HFA) 108 (90 Base) MCG/ACT inhaler, Inhale 2 puffs into the lungs every 6 (six) hours as needed for wheezing or shortness of breath., Disp: 8 g, Rfl: 2   anastrozole (ARIMIDEX) 1 MG tablet, Take 1 tablet (1 mg total) by mouth daily., Disp: 30 tablet, Rfl: 11   busPIRone (BUSPAR) 10 MG tablet, Take 1 tablet (10 mg total) by mouth 3 (three) times daily., Disp: 90 tablet, Rfl: 1   citalopram (CELEXA) 40 MG tablet, Take 1 tablet (40 mg total) by mouth daily., Disp: 90 tablet, Rfl: 0   clonazePAM (KLONOPIN) 0.5 MG tablet, Take 1 tablet (0.5 mg total) by mouth 2 (two) times daily., Disp: 30 tablet, Rfl: 1   potassium chloride SA (KLOR-CON M) 20 MEQ tablet, Take 1 tablet (20 mEq total) by mouth daily., Disp: 14 tablet, Rfl: 0   pravastatin (PRAVACHOL) 20 MG tablet, Take 1 tablet (20 mg total) by mouth daily., Disp: 30 tablet, Rfl: 5   QUEtiapine (SEROQUEL) 50 MG tablet, Take 1 tablet (50 mg total) by mouth at bedtime., Disp: 30 tablet, Rfl: 3   risperiDONE (RISPERDAL) 0.5 MG tablet, Take 1 tablet (0.5 mg total) by  mouth 2 (two) times daily at 8 am and 4 pm., Disp: 60 tablet, Rfl: 3   triamterene-hydrochlorothiazide (MAXZIDE-25) 37.5-25 MG tablet, Take 1 tablet by mouth daily., Disp: 30 tablet, Rfl: 5   Tiotropium Bromide-Olodaterol (STIOLTO RESPIMAT) 2.5-2.5 MCG/ACT AERS, Inhale 2 puffs into the lungs daily., Disp: 4 g, Rfl: 11   Subjective:   [name] GENDER: female DOB: August 15, 1945, MRN: [id number]  Chief Complaint  Patient presents with   pulmonary consult     Hx of asthma. SOB with exertion.     HPI  Patient is a pleasant 77 year old female presenting to clinic for the evaluation of shortness of breath.  Patient has a past medical history of restrictive lung disease secondary to kyphoscoliosis previously followed at [facility].  Patient has had exertional dyspnea for many years and has been followed by [facility] pulmonology since 2010.  She was lost to follow-up around 2023 and is presenting to establish care locally today.  Patient reports that her symptoms had been ongoing and persistent but she is unable to identify a point in time when the symptoms worsened.  She feels that over the last year she has had increased exertional dyspnea.  Over the weekend, they were at the [employer] and she had to stop multiple times to catch her breath.  She is maintained on Stiolto which she uses 1 puff twice daily and feels some improvement with it.  She does not have a rescue inhaler such as albuterol.  She reports a one-time episode in the past where she had to use prednisone but denies any hospitalizations for respiratory symptoms.  Patient denies any cough, wheezing, chest pain, chest tightness, sputum production, palpitations, fevers, chills, night sweats, or weight loss.  She was previously highly active and was working out at the gym to lose weight but has not done so for a while now.  She has been dealing with increased anxiety and following with her primary care physician and psychiatry for that.  Patient denies any history of asthma growing up and reports that this was a newer diagnosis.  When she was a young adult she did experience shortness of breath with exertion secondary to her kyphoscoliosis.  Patient is a lifelong non-smoker and denies any occupational exposures.  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  They previously had a cat but do not currently have any pets.  There is a family members to have dogs and her symptoms do  not get worse around those.  She has not had any birds.  Lives at home, since 1979, with no water leaks or mold reported.  Ancillary information including prior medications, full medical/surgical/family/social histories, and PFTs (when available) are listed below and have been reviewed.   Review of Systems  Constitutional:  Negative for chills, fever, malaise/fatigue and weight loss.  Respiratory:  Positive for shortness of breath. Negative for cough, hemoptysis, sputum production and wheezing.   Cardiovascular:  Negative for chest pain, leg swelling and PND.     Objective:   Vitals:   08/10/22 0916  BP: 122/76  Pulse: 77  Temp: 98.1 F (36.7 C)  TempSrc: Temporal  SpO2: 96%  Weight: 128 lb (58.1 kg)  Height: 5\' 1"  (1.549 m)   96% on RA BMI Readings from Last 3 Encounters:  08/10/22 24.19 kg/m  07/29/22 23.61 kg/m  07/15/22 21.84 kg/m   Wt Readings from Last 3 Encounters:  08/10/22 128 lb (58.1 kg)  07/29/22 127  lb (57.6 kg)  07/15/22 119 lb 6.4 oz (54.2 kg)    Physical Exam Constitutional:      Appearance: Normal appearance. She is not ill-appearing.  Cardiovascular:     Rate and Rhythm: Normal rate and regular rhythm.     Pulses: Normal pulses.     Heart sounds: Normal heart sounds.  Pulmonary:     Effort: Pulmonary effort is normal.     Breath sounds: Normal breath sounds.  Abdominal:     Palpations: Abdomen is soft.  Musculoskeletal:        General: Deformity present.     Thoracic back: Scoliosis present.     Lumbar back: Scoliosis present.  Neurological:     General: No focal deficit present.     Mental Status: She is alert and oriented to person, place, and time. Mental status is at baseline.       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 Antony Blackbird   Hypertension    PONV (postoperative nausea and vomiting)    Scoliosis    Ulcer      Family History  Problem Relation Age of Onset   Stroke Mother    Cancer Father        unknown type   Cancer Brother        "blood cancer"   CVA Maternal Grandmother    Healthy Son    Breast cancer Neg Hx    Colon cancer Neg Hx    Mental illness Neg Hx      Past Surgical History:  Procedure Laterality Date   ABDOMINAL HYSTERECTOMY     ANTERIOR CERVICAL DECOMP/DISCECTOMY FUSION N/A 04/19/2017   Procedure: Anterior Cervical Decompression Fusion Cervical Three-Four, Cervical Four-Five, Cervical Five-Six;  Surgeon: Donalee Citrin, MD;  Location: [facility] OR;  Service: Neurosurgery;  Laterality: N/A;  Anterior Cervical Decompression Fusion Cervical Three-Four, Cervical Four-Five, Cervical Five-Six   BREAST BIOPSY Right 11/14/2020   [place] Bx, Venus Clip, Invasive Mammary Carcinoma   BREAST LUMPECTOMY Right 2022   With radiation   BREAST LUMPECTOMY WITH RADIOACTIVE SEED AND SENTINEL LYMPH NODE BIOPSY Right 12/27/2020   Procedure: RIGHT BREAST LUMPECTOMY WITH RADIOACTIVE SEED AND AXILLARY SENTINEL LYMPH NODE BIOPSY;  Surgeon: Emelia Loron, MD;  Location: [facility] OR;  Service: General;  Laterality: Right;   CHOLECYSTECTOMY  1996   DILATION AND CURETTAGE OF UTERUS  1971   LAPAROSCOPIC REMOVAL OF MESENTERIC MASS     LUMBAR DISC SURGERY  1998   [facility]   PARTIAL HYSTERECTOMY  1981   prolapsed uterus   TUBAL LIGATION  1978    Social History   Socioeconomic History   Marital status: Married    Spouse name: Erie Noe"   Number of children: 2   Years of education: Not on file   Highest education level: 12th grade  Occupational History   Occupation: retired Runner, broadcasting/film/video  Tobacco Use   Smoking status: Never   Smokeless tobacco: Never  Vaping Use   Vaping status:  Never Used  Substance and Sexual Activity   Alcohol use:  No    Alcohol/week: 0.0 standard drinks of alcohol   Drug use: No   Sexual activity: Not Currently    Comment: not asked if sexually active  Other Topics Concern   Not on file  Social History Narrative   Right handed    Lives with husband   Had breast cancer surgery right side   Worked in the school system    Social Determinants of Health   Financial Resource Strain: Low Risk  (02/13/2022)   Overall Financial Resource Strain (CARDIA)    Difficulty of Paying Living Expenses: Not hard at all  Food Insecurity: No Food Insecurity (07/15/2022)   Hunger Vital Sign    Worried About Running Out of Food in the Last Year: Never true    Ran Out of Food in the Last Year: Never true  Transportation Needs: No Transportation Needs (07/15/2022)   PRAPARE - Administrator, Civil Service (Medical): No    Lack of Transportation (Non-Medical): No  Physical Activity: Unknown (07/14/2022)   Exercise Vital Sign    Days of Exercise per Week: 0 days    Minutes of Exercise per Session: Not on file  Stress: Stress Concern Present (07/14/2022)   [employer] of Occupational Health - Occupational Stress Questionnaire    Feeling of Stress : Very much  Social Connections: Moderately Integrated (07/14/2022)   Social Connection and Isolation Panel [NHANES]    Frequency of Communication with Friends and Family: More than three times a week    Frequency of Social Gatherings with Friends and Family: More than three times a week    Attends Religious Services: 1 to 4 times per year    Active Member of [employer] or Organizations: No    Attends Engineer, structural: Not on file    Marital Status: Married  Intimate Partner Violence: Not At Risk (07/15/2022)   Humiliation, Afraid, Rape, and Kick questionnaire    Fear of Current or Ex-Partner: No    Emotionally Abused: No    Physically Abused: No    Sexually Abused: No     Allergies   Allergen Reactions   Advair Diskus [Fluticasone-Salmeterol] Other (See Comments)    Causes blisters in her mouth   Codeine     Unknown reaction   Pollen Extract Cough     CBC    Component Value Date/Time   WBC 10.3 07/15/2022 1815   RBC 3.78 (L) 07/15/2022 1815   HGB 12.0 07/15/2022 1815   HGB 13.9 03/23/2022 1341   HCT 35.7 (L) 07/15/2022 1815   PLT 244 07/15/2022 1815   PLT 232 03/23/2022 1341   MCV 94.4 07/15/2022 1815   MCH 31.7 07/15/2022 1815   MCHC 33.6 07/15/2022 1815   RDW 12.6 07/15/2022 1815   LYMPHSABS 2.1 07/15/2022 1815   MONOABS 1.0 07/15/2022 1815   EOSABS 0.3 07/15/2022 1815   BASOSABS 0.1 07/15/2022 1815    Pulmonary Functions Testing Results:     No data to display          Outpatient Medications Prior to Visit  Medication Sig Dispense Refill   anastrozole (ARIMIDEX) 1 MG tablet Take 1 tablet (1 mg total) by mouth daily. 30 tablet 11   busPIRone (BUSPAR) 10 MG tablet Take 1 tablet (10 mg total) by mouth 3 (three) times daily. 90 tablet 1   citalopram (CELEXA) 40 MG tablet Take 1 tablet (40 mg total) by mouth daily. 90 tablet 0   clonazePAM (KLONOPIN) 0.5 MG tablet  Take 1 tablet (0.5 mg total) by mouth 2 (two) times daily. 30 tablet 1   potassium chloride SA (KLOR-CON M) 20 MEQ tablet Take 1 tablet (20 mEq total) by mouth daily. 14 tablet 0   pravastatin (PRAVACHOL) 20 MG tablet Take 1 tablet (20 mg total) by mouth daily. 30 tablet 5   QUEtiapine (SEROQUEL) 50 MG tablet Take 1 tablet (50 mg total) by mouth at bedtime. 30 tablet 3   risperiDONE (RISPERDAL) 0.5 MG tablet Take 1 tablet (0.5 mg total) by mouth 2 (two) times daily at 8 am and 4 pm. 60 tablet 3   triamterene-hydrochlorothiazide (MAXZIDE-25) 37.5-25 MG tablet Take 1 tablet by mouth daily. 30 tablet 5   levalbuterol (XOPENEX HFA) 45 MCG/ACT inhaler Inhale 1-2 puffs into the lungs every 6 (six) hours as needed for wheezing or shortness of breath. 1 each 2   Tiotropium Bromide-Olodaterol  (STIOLTO RESPIMAT) 2.5-2.5 MCG/ACT AERS Inhale 1 puff into the lungs daily. 4 g 2   No facility-administered medications prior to visit.

## 2022-08-14 DIAGNOSIS — J449 Chronic obstructive pulmonary disease, unspecified: Secondary | ICD-10-CM | POA: Diagnosis not present

## 2022-08-28 ENCOUNTER — Other Ambulatory Visit: Payer: Self-pay | Admitting: Psychiatry

## 2022-08-28 DIAGNOSIS — F411 Generalized anxiety disorder: Secondary | ICD-10-CM

## 2022-08-28 DIAGNOSIS — F321 Major depressive disorder, single episode, moderate: Secondary | ICD-10-CM

## 2022-08-31 ENCOUNTER — Other Ambulatory Visit: Payer: Self-pay | Admitting: Internal Medicine

## 2022-08-31 ENCOUNTER — Other Ambulatory Visit: Payer: Self-pay | Admitting: Psychiatry

## 2022-08-31 ENCOUNTER — Ambulatory Visit: Admission: RE | Admit: 2022-08-31 | Payer: Medicare Other | Source: Ambulatory Visit

## 2022-08-31 DIAGNOSIS — F411 Generalized anxiety disorder: Secondary | ICD-10-CM

## 2022-08-31 NOTE — Telephone Encounter (Signed)
Please refuse. Dr Elna Breslow should be prescribing. Will confirm.

## 2022-09-02 ENCOUNTER — Telehealth: Payer: Self-pay | Admitting: Internal Medicine

## 2022-09-02 NOTE — Telephone Encounter (Signed)
Spoke with [employer]. Refill request sent to Dr Elna Breslow

## 2022-09-02 NOTE — Telephone Encounter (Signed)
Patient was calling to let me know that she received a letter stating that her insurance was no longer going to cover her oxygen. She is seeing pulmonary and is going to contact them.

## 2022-09-02 NOTE — Telephone Encounter (Signed)
Pt called in asking to speak to [facility] LPN. Pt did not provided me additional message.

## 2022-09-09 ENCOUNTER — Encounter: Payer: Self-pay | Admitting: Psychiatry

## 2022-09-09 ENCOUNTER — Ambulatory Visit (INDEPENDENT_AMBULATORY_CARE_PROVIDER_SITE_OTHER): Payer: Medicare Other | Admitting: Psychiatry

## 2022-09-09 VITALS — BP 117/77 | HR 71 | Temp 97.6°F | Ht 61.0 in | Wt 127.6 lb

## 2022-09-09 DIAGNOSIS — F3342 Major depressive disorder, recurrent, in full remission: Secondary | ICD-10-CM

## 2022-09-09 DIAGNOSIS — Z79899 Other long term (current) drug therapy: Secondary | ICD-10-CM | POA: Diagnosis not present

## 2022-09-09 DIAGNOSIS — F411 Generalized anxiety disorder: Secondary | ICD-10-CM

## 2022-09-09 DIAGNOSIS — Z91148 Patient's other noncompliance with medication regimen for other reason: Secondary | ICD-10-CM

## 2022-09-09 MED ORDER — CLONAZEPAM 0.5 MG PO TABS
0.5000 mg | ORAL_TABLET | Freq: Two times a day (BID) | ORAL | 2 refills | Status: DC
Start: 2022-09-14 — End: 2022-12-09

## 2022-09-09 NOTE — Progress Notes (Unsigned)
BH MD OP Progress Note  09/09/2022 12:24 PM Jennifer [name]  MRN:  [id number]  Chief Complaint:  Chief Complaint  Patient presents with   Follow-up   Depression   Anxiety   Medication Refill   HPI: Jennifer [name] is a 77 year old Caucasian female, married, retired, lives in [place], has a history of GAD, MDD, COPD, history of malignant neoplasm of upper outer quadrant of right breast, estrogen positive, status post lumpectomy and radiation currently in remission, was evaluated in office today.  Patient with recent inpatient behavioral health admission to geriatric psychiatric unit at [facility]-07/15/2022 - 07/20/2022, I have reviewed notes per Dr.Herrick-psychiatrist-patient was discharged on medications like Seroquel 50 mg, risperidone 0.5 mg twice daily, BuSpar, Celexa, clonazepam and trazodone as needed.'  Patient today appeared to be calm, alert, cooperative.  Patient reports she is currently doing well.  She does believe she is depressed.  She reports anxiety symptoms are manageable.  Patient reports she is able to do the things that she enjoys.  She does crossword puzzles.  She reports she does have good support system from her family including her daughter.  She is currently taking all her medications as scheduled since the pharmacist gives it to her in lock boxes.  That does help.  Patient denies any suicidality, homicidality or perceptual disturbances.  Patient alert and oriented to person place time and situation.  3 word memory immediate 3 out of 3, after 5 minutes 1 out of 3.  Patient with concentration and attention good, was able to do calculations serial sevens.  According to spouse who provided collateral information patient is currently doing well, taking medications regularly as prescribed and is happy with her progress.  Denies any other concerns today.  Denies any side effects to medications.  Visit Diagnosis:    ICD-10-CM   1. GAD (generalized anxiety disorder)  F41.1 clonazePAM  (KLONOPIN) 0.5 MG tablet    2. MDD (major depressive disorder), recurrent, in full remission (HCC)  F33.42     3. Overuse of medication  Z91.148    Benzodiazepines    4. High risk medication use  Z79.899 Prolactin      Past Psychiatric History: I have reviewed past psychiatric history from progress note on 04/08/2022.  Past trials of BuSpar, Celexa, lorazepam.  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 Antony Blackbird   Hypertension    PONV (postoperative nausea and vomiting)    Scoliosis    Ulcer     Past Surgical History:  Procedure Laterality Date   ABDOMINAL HYSTERECTOMY     ANTERIOR CERVICAL DECOMP/DISCECTOMY FUSION N/A 04/19/2017   Procedure: Anterior Cervical Decompression Fusion Cervical Three-Four, Cervical Four-Five, Cervical Five-Six;  Surgeon: Donalee Citrin, MD;  Location: [facility] OR;  Service: Neurosurgery;  Laterality: N/A;  Anterior Cervical Decompression Fusion Cervical Three-Four, Cervical Four-Five, Cervical Five-Six   BREAST BIOPSY Right 11/14/2020   [place] Bx, Venus Clip, Invasive Mammary Carcinoma   BREAST LUMPECTOMY Right 2022   With radiation   BREAST LUMPECTOMY WITH RADIOACTIVE SEED AND SENTINEL LYMPH NODE BIOPSY  Right 12/27/2020   Procedure: RIGHT BREAST LUMPECTOMY WITH RADIOACTIVE SEED AND AXILLARY SENTINEL LYMPH NODE BIOPSY;  Surgeon: Emelia Loron, MD;  Location: [facility] OR;  Service: General;  Laterality: Right;   CHOLECYSTECTOMY  1996   DILATION AND CURETTAGE OF UTERUS  1971   LAPAROSCOPIC REMOVAL OF MESENTERIC MASS     LUMBAR DISC SURGERY  1998   [facility]   PARTIAL HYSTERECTOMY  1981   prolapsed uterus   TUBAL LIGATION  1978     Family Psychiatric History: Reviewed family psychiatric history from progress note on 04/08/2022.  Family History:  Family History  Problem Relation Age of Onset   Stroke Mother    Cancer Father        unknown type   Cancer Brother        "blood cancer"   CVA Maternal Grandmother    Healthy Son    Breast cancer Neg Hx    Colon cancer Neg Hx    Mental illness Neg Hx     Social History: Reviewed social history from progress note on 04/08/2022. Social History   Socioeconomic History   Marital status: Married    Spouse name: Erie Noe"   Number of children: 2   Years of education: Not on file   Highest education level: 12th grade  Occupational History   Occupation: retired Runner, broadcasting/film/video  Tobacco Use   Smoking status: Never   Smokeless tobacco: Never  Vaping Use   Vaping status: Never Used  Substance and Sexual Activity   Alcohol use: No    Alcohol/week: 0.0 standard drinks of alcohol   Drug use: No   Sexual activity: Not Currently    Comment: not asked if sexually active  Other Topics Concern   Not on file  Social History Narrative   Right handed    Lives with husband   Had breast cancer surgery right side   Worked in the school system    Social Determinants of Health   Financial Resource Strain: Low Risk  (02/13/2022)   Overall Financial Resource Strain (CARDIA)    Difficulty of Paying Living Expenses: Not hard at all  Food Insecurity: No Food Insecurity (07/15/2022)   Hunger Vital Sign    Worried About Running Out of Food in the Last Year: Never true    Ran Out of Food in the Last Year: Never true  Transportation Needs: No Transportation Needs (07/15/2022)   PRAPARE - Administrator, Civil Service (Medical): No    Lack of Transportation (Non-Medical): No  Physical Activity: Unknown (07/14/2022)   Exercise Vital Sign    Days of Exercise per Week: 0 days    Minutes of Exercise per Session: Not on file  Stress: Stress Concern Present (07/14/2022)    [employer] of Occupational Health - Occupational Stress Questionnaire    Feeling of Stress : Very much  Social Connections: Moderately Integrated (07/14/2022)   Social Connection and Isolation Panel [NHANES]    Frequency of Communication with Friends and Family: More than three times a week    Frequency of Social Gatherings with Friends and Family: More than three times a week    Attends Religious Services: 1 to 4 times per year    Active Member of [employer] or Organizations: No    Attends Engineer, structural: Not on file    Marital Status: Married    Allergies:  Allergies  Allergen Reactions   Advair Diskus [Fluticasone-Salmeterol] Other (See Comments)    Causes blisters  in her mouth   Codeine     Unknown reaction   Pollen Extract Cough    Metabolic Disorder Labs: Lab Results  Component Value Date   HGBA1C 5.6 05/13/2022   No results found for: "PROLACTIN" Lab Results  Component Value Date   CHOL 151 05/13/2022   TRIG 83.0 05/13/2022   HDL 73.00 05/13/2022   CHOLHDL 2 05/13/2022   VLDL 16.6 05/13/2022   LDLCALC 61 05/13/2022   LDLCALC 86 02/10/2022   Lab Results  Component Value Date   TSH 0.798 07/15/2022   TSH 0.93 12/16/2021    Therapeutic Level Labs: No results found for: "LITHIUM" No results found for: "VALPROATE" No results found for: "CBMZ"  Current Medications: Current Outpatient Medications  Medication Sig Dispense Refill   albuterol (VENTOLIN HFA) 108 (90 Base) MCG/ACT inhaler Inhale 2 puffs into the lungs every 6 (six) hours as needed for wheezing or shortness of breath. 8 g 2   anastrozole (ARIMIDEX) 1 MG tablet Take 1 tablet (1 mg total) by mouth daily. 30 tablet 11   busPIRone (BUSPAR) 10 MG tablet Take 1 tablet (10 mg total) by mouth 3 (three) times daily. 90 tablet 0   citalopram (CELEXA) 40 MG tablet Take 1 tablet (40 mg total) by mouth daily. 90 tablet 0   potassium chloride SA (KLOR-CON M) 20 MEQ tablet Take 1 tablet (20 mEq  total) by mouth daily. 14 tablet 0   pravastatin (PRAVACHOL) 20 MG tablet Take 1 tablet (20 mg total) by mouth daily. 30 tablet 5   QUEtiapine (SEROQUEL) 50 MG tablet Take 1 tablet (50 mg total) by mouth at bedtime. 30 tablet 3   risperiDONE (RISPERDAL) 0.5 MG tablet Take 1 tablet (0.5 mg total) by mouth 2 (two) times daily at 8 am and 4 pm. 60 tablet 3   Tiotropium Bromide-Olodaterol (STIOLTO RESPIMAT) 2.5-2.5 MCG/ACT AERS Inhale 2 puffs into the lungs daily. 4 g 11   traZODone (DESYREL) 50 MG tablet Take 25-50 mg by mouth at bedtime as needed for sleep.     triamterene-hydrochlorothiazide (MAXZIDE-25) 37.5-25 MG tablet Take 1 tablet by mouth daily each morning for fluid retention. 90 tablet 1   [START ON 09/14/2022] clonazePAM (KLONOPIN) 0.5 MG tablet Take 1 tablet (0.5 mg total) by mouth 2 (two) times daily. 60 tablet 2   No current facility-administered medications for this visit.     Musculoskeletal: Strength & Muscle Tone: within normal limits Gait & Station: normal Patient leans: N/A  Psychiatric Specialty Exam: Review of Systems  Psychiatric/Behavioral: Negative.      Blood pressure 117/77, pulse 71, temperature 97.6 F (36.4 C), temperature source Skin, height 5\' 1"  (1.549 m), weight 127 lb 9.6 oz (57.9 kg).Body mass index is 24.11 kg/m.  General Appearance: Fairly Groomed  Eye Contact:  Fair  Speech:  Clear and Coherent  Volume:  Normal  Mood:  Euthymic  Affect:  Congruent  Thought Process:  Goal Directed and Descriptions of Associations: Intact  Orientation:  Full (Time, Place, and Person)  Thought Content: Logical   Suicidal Thoughts:  No  Homicidal Thoughts:  No  Memory:  Immediate;   Fair Recent;   Fair Remote;   Fair  Judgement:  Fair  Insight:  Fair  Psychomotor Activity:  Normal  Concentration:  Concentration: Fair and Attention Span: Fair  Recall:   limited  Fund of Knowledge: Fair  Language: Fair  Akathisia:  No  Handed:  Right  AIMS (if indicated):  done  Assets:  Communication Skills Desire for Improvement Housing Social Support  ADL's:  Intact  Cognition: WNL  Sleep:  Fair   Screenings: AUDIT    Flowsheet Row Admission (Discharged) from 07/15/2022 in [facility]  Alcohol Use Disorder Identification Test Final Score (AUDIT) 0      GAD-7    Flowsheet Row Office Visit from 06/26/2022 in [facility] Office Visit from 06/10/2022 in [facility] Office Visit from 05/14/2022 in [facility] Office Visit from 04/08/2022 in [facility]  Total GAD-7 Score 21 21 13 2       Mini-Mental    Flowsheet Row Office Visit from 06/26/2022 in [facility] Clinical Support from 06/23/2016 in [facility]  Total Score (max 30 points ) 26 30      PHQ2-9    Flowsheet Row Office Visit from 06/26/2022 in [facility] Office Visit from 06/10/2022 in [facility] Office Visit from 05/14/2022 in [facility] Office Visit from 04/08/2022 in [facility] Clinical Support from 02/13/2022 in [facility] Total Score 6 6 2 1  0  PHQ-9 Total Score 25 23 11 3  --      Flowsheet Row Admission (Discharged) from 07/15/2022 in [facility] BE[facility]AVIORAL MEDICINE Most recent reading at 07/15/2022 10:44 PM ED from 07/15/2022 in [facility] Most recent reading at 07/15/2022  3:26 PM ED from 07/08/2022 in [facility] Emergency Department at [facility] Most recent reading at 07/08/2022  3:44 PM  C-SSRS RISK CATEGORY No Risk No Risk No Risk        Assessment and Plan: Jennifer [name]  is a 77 year old Caucasian female, married, retired, lives in [place], has a history of depression, anxiety, breast cancer in remission, recent inpatient behavioral health admission to geropsychiatric unit at [facility]-discharged on 07/20/2022, currently stable on current medication regimen.  Plan as noted below.  Plan GAD-improving Celexa 40 mg p.o. daily BuSpar 10 mg p.o. 3 times daily Klonopin 0.5 mg twice a day. Reviewed [place] PMP AWARxE Continue Seroquel 50 mg p.o. nightly  MDD in remission Risperidone 0.5 mg twice daily. Celexa 40 mg p.o. daily BuSpar 10 mg p.o. 3 times daily Trazodone 25-50 mg p.o. nightly as needed  Overuse of medications-benzodiazepines-in remission Patient is currently taking medications as prescribed.  She has good social support system and pharmacy is also giving her medications in a locked box so she can take her morning and evening pills as scheduled.  High risk medication use-patient will benefit from monitoring of lipid panel, hemoglobin A1c, prolactin, sodium level as well as EKG since she is currently on 2 antipsychotic medications.  Provided education about long-term risk of being on 2 antipsychotic medications which includes cardiac effect, metabolic syndrome, tardive dyskinesia, weight gain, as well as black box warning in elderly patients with dementia symptoms.  Most recent EKG-dated 07/21/2022-QTc 434, normal sinus rhythm.  Will consider repeating this in the future. Patient provided lab slip for prolactin level.  She has scheduled appointment with her primary care provider for other labs.  Water is pending can be ordered at her next visit.  Patient as well as spouse voiced understanding.    Collaboration of Care: Collaboration of Care: Other I have reviewed notes per  Dr. Marlou Porch dated 07/15/2022 - 07/20/2022-inpatient behavioral health admission at [facility]-patient was continued on her medications like Celexa, BuSpar, trazodone, initially started on Seroquel for  sleep and depression and thereafter risperidone and Klonopin was added.'  Patient/Guardian was advised Release of Information must be obtained prior to any record release in order to collaborate their care with an outside provider. Patient/Guardian was advised if they have not already done so to contact the registration department to sign all necessary forms in order for [place] to release information regarding their care.   Consent: Patient/Guardian gives verbal consent for treatment and assignment of benefits for services provided during this visit. Patient/Guardian expressed understanding and agreed to proceed.   Follow-up in clinic in 3 months or sooner if needed.  I have spent atleast 40 minutes face to face with patient today which includes the time spent for preparing to see the patient ( e.g., review of test, records ), obtaining and to review and separately obtained history , ordering medications and test ,psychoeducation and supportive psychotherapy and care coordination,as well as documenting clinical information in electronic health record,interpreting and communication of test results.  This note was generated in part or whole with voice recognition software. Voice recognition is usually quite accurate but there are transcription errors that can and very often do occur. I apologize for any typographical errors that were not detected and corrected.     Jomarie Longs, MD 09/09/2022, 12:24 PM

## 2022-09-14 DIAGNOSIS — J449 Chronic obstructive pulmonary disease, unspecified: Secondary | ICD-10-CM | POA: Diagnosis not present

## 2022-09-22 ENCOUNTER — Other Ambulatory Visit: Payer: Self-pay | Admitting: *Deleted

## 2022-09-22 ENCOUNTER — Other Ambulatory Visit: Payer: Self-pay | Admitting: Psychiatry

## 2022-09-22 DIAGNOSIS — F411 Generalized anxiety disorder: Secondary | ICD-10-CM

## 2022-09-22 DIAGNOSIS — F321 Major depressive disorder, single episode, moderate: Secondary | ICD-10-CM

## 2022-09-22 DIAGNOSIS — Z170 Estrogen receptor positive status [ER+]: Secondary | ICD-10-CM

## 2022-09-23 ENCOUNTER — Inpatient Hospital Stay: Payer: Medicare Other | Admitting: Adult Health

## 2022-09-23 ENCOUNTER — Inpatient Hospital Stay: Payer: Medicare Other | Attending: Adult Health

## 2022-09-30 ENCOUNTER — Telehealth: Payer: Self-pay | Admitting: Internal Medicine

## 2022-09-30 DIAGNOSIS — E7800 Pure hypercholesterolemia, unspecified: Secondary | ICD-10-CM

## 2022-09-30 DIAGNOSIS — R739 Hyperglycemia, unspecified: Secondary | ICD-10-CM

## 2022-09-30 NOTE — Telephone Encounter (Signed)
Lipid and A1C ordered. Patient has future order in for CMP and CBC through oncology. Anything else need to be ordered?

## 2022-09-30 NOTE — Telephone Encounter (Signed)
Met b and liver panel added per Dr Lorin Picket.. Pt does not have appt scheduled with oncology for labs.

## 2022-09-30 NOTE — Telephone Encounter (Signed)
Patient need labs.

## 2022-09-30 NOTE — Telephone Encounter (Signed)
Looks like she is scheduled for labs 10/07/22 - cbc and met c.  This should be all we need.

## 2022-09-30 NOTE — Addendum Note (Signed)
Addended by: Rita Ohara D on: 09/30/2022 03:55 PM   Modules accepted: Orders

## 2022-10-07 ENCOUNTER — Other Ambulatory Visit (INDEPENDENT_AMBULATORY_CARE_PROVIDER_SITE_OTHER): Payer: Medicare Other

## 2022-10-07 DIAGNOSIS — R739 Hyperglycemia, unspecified: Secondary | ICD-10-CM

## 2022-10-07 DIAGNOSIS — E7800 Pure hypercholesterolemia, unspecified: Secondary | ICD-10-CM

## 2022-10-07 LAB — BASIC METABOLIC PANEL WITH GFR
BUN: 16 mg/dL (ref 6–23)
CO2: 35 meq/L — ABNORMAL HIGH (ref 19–32)
Calcium: 9.2 mg/dL (ref 8.4–10.5)
Chloride: 99 meq/L (ref 96–112)
Creatinine, Ser: 0.94 mg/dL (ref 0.40–1.20)
GFR: 59.28 mL/min — ABNORMAL LOW (ref >60.00)
Glucose, Bld: 83 mg/dL (ref 70–99)
Potassium: 3.7 meq/L (ref 3.5–5.1)
Sodium: 140 meq/L (ref 135–145)

## 2022-10-07 LAB — HEPATIC FUNCTION PANEL
ALT: 15 U/L (ref 0–35)
AST: 23 U/L (ref 0–37)
Albumin: 4 g/dL (ref 3.5–5.2)
Alkaline Phosphatase: 59 U/L (ref 39–117)
Bilirubin, Direct: 0 mg/dL (ref 0.0–0.3)
Total Bilirubin: 0.6 mg/dL (ref 0.2–1.2)
Total Protein: 6.4 g/dL (ref 6.0–8.3)

## 2022-10-07 LAB — HEMOGLOBIN A1C: Hgb A1c MFr Bld: 5.4 % (ref 4.6–6.5)

## 2022-10-07 LAB — LIPID PANEL
Cholesterol: 154 mg/dL (ref 0–200)
HDL: 77 mg/dL (ref >39.00)
LDL Cholesterol: 64 mg/dL (ref 0–99)
NonHDL: 76.99
Total CHOL/HDL Ratio: 2
Triglycerides: 67 mg/dL (ref 0.0–149.0)
VLDL: 13.4 mg/dL (ref 0.0–40.0)

## 2022-10-09 ENCOUNTER — Ambulatory Visit (INDEPENDENT_AMBULATORY_CARE_PROVIDER_SITE_OTHER): Payer: Medicare Other | Admitting: Internal Medicine

## 2022-10-09 VITALS — BP 118/70 | HR 76 | Temp 98.2°F | Resp 16 | Ht 61.5 in | Wt 132.0 lb

## 2022-10-09 DIAGNOSIS — R739 Hyperglycemia, unspecified: Secondary | ICD-10-CM

## 2022-10-09 DIAGNOSIS — I10 Essential (primary) hypertension: Secondary | ICD-10-CM | POA: Diagnosis not present

## 2022-10-09 DIAGNOSIS — C50411 Malignant neoplasm of upper-outer quadrant of right female breast: Secondary | ICD-10-CM

## 2022-10-09 DIAGNOSIS — E7800 Pure hypercholesterolemia, unspecified: Secondary | ICD-10-CM

## 2022-10-09 DIAGNOSIS — Z23 Encounter for immunization: Secondary | ICD-10-CM

## 2022-10-09 DIAGNOSIS — F419 Anxiety disorder, unspecified: Secondary | ICD-10-CM

## 2022-10-09 DIAGNOSIS — Z170 Estrogen receptor positive status [ER+]: Secondary | ICD-10-CM

## 2022-10-09 DIAGNOSIS — J4520 Mild intermittent asthma, uncomplicated: Secondary | ICD-10-CM | POA: Diagnosis not present

## 2022-10-09 DIAGNOSIS — K2100 Gastro-esophageal reflux disease with esophagitis, without bleeding: Secondary | ICD-10-CM

## 2022-10-09 NOTE — Progress Notes (Unsigned)
Subjective:    [name], female    DOB: 07/28/1945, 77 y.o.   MRN: [id number]  Patient here for  Chief Complaint  Patient presents with   Medical Management of Chronic Issues    HPI Here for a scheduled follow up. Here to follow up regarding increased stress, anxiety, asthma and hypercholesterolemia.  She is accompanied by her husband.  History obtained from both of them.  She is doing much better.  Anxiety is better.  Eating well.  No chest pain or sob reported.  No nausea or vomiting.  No bowel change reported.     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 Antony Blackbird   Hypertension    PONV (postoperative nausea and vomiting)    Scoliosis    Ulcer    Past Surgical History:  Procedure Laterality Date   ABDOMINAL HYSTERECTOMY     ANTERIOR CERVICAL DECOMP/DISCECTOMY FUSION N/A 04/19/2017   Procedure: Anterior Cervical Decompression Fusion Cervical Three-Four, Cervical Four-Five, Cervical Five-Six;  Surgeon: Donalee Citrin, MD;  Location: [facility] OR;  Service: Neurosurgery;  Laterality: N/A;  Anterior Cervical Decompression Fusion Cervical Three-Four, Cervical Four-Five, Cervical Five-Six   BREAST BIOPSY Right 11/14/2020   [place] Bx, Venus Clip, Invasive Mammary Carcinoma   BREAST LUMPECTOMY Right 2022   With radiation   BREAST LUMPECTOMY WITH RADIOACTIVE SEED AND SENTINEL LYMPH NODE BIOPSY Right 12/27/2020   Procedure: RIGHT BREAST LUMPECTOMY WITH RADIOACTIVE SEED AND AXILLARY SENTINEL LYMPH NODE BIOPSY;  Surgeon: Emelia Loron, MD;  Location: [facility] OR;  Service: General;  Laterality: Right;   CHOLECYSTECTOMY  1996   DILATION AND CURETTAGE OF UTERUS  1971    LAPAROSCOPIC REMOVAL OF MESENTERIC MASS     LUMBAR DISC SURGERY  1998   [facility]   PARTIAL HYSTERECTOMY  1981   prolapsed uterus   TUBAL LIGATION  1978   Family History  Problem Relation Age of Onset   Stroke Mother    Cancer Father        unknown type   Cancer Brother        "blood cancer"   CVA Maternal Grandmother    Healthy Son    Breast cancer Neg Hx    Colon cancer Neg Hx    Mental illness Neg Hx    Social History   Socioeconomic History   Marital status: Married    Spouse name: Mariana Kaufman "[name]"   Number of children: 2   Years of education: Not on file   Highest education level: 12th grade  Occupational History   Occupation: retired Runner, broadcasting/film/video  Tobacco Use   Smoking status: Never   Smokeless tobacco: Never  Vaping Use   Vaping status: Never Used  Substance and Sexual Activity   Alcohol use: No    Alcohol/week: 0.0 standard drinks of alcohol   Drug use: No   Sexual activity: Not Currently    Comment: not asked if sexually active  Other Topics Concern   Not on file  Social History Narrative   Right handed    Lives with husband   Had breast  cancer surgery right side   Worked in the school system    Social Determinants of Health   Financial Resource Strain: Low Risk  (02/13/2022)   Overall Financial Resource Strain (CARDIA)    Difficulty of Paying Living Expenses: Not hard at all  Food Insecurity: No Food Insecurity (07/15/2022)   Hunger Vital Sign    Worried About Running Out of Food in the Last Year: Never true    Ran Out of Food in the Last Year: Never true  Transportation Needs: No Transportation Needs (07/15/2022)   PRAPARE - Administrator, Civil Service (Medical): No    Lack of Transportation (Non-Medical): No  Physical Activity: Unknown (07/14/2022)   Exercise Vital Sign    Days of Exercise per Week: 0 days    Minutes of Exercise per Session: Not on file  Stress: Stress Concern Present (07/14/2022)   [employer] of Occupational Health -  Occupational Stress Questionnaire    Feeling of Stress : Very much  Social Connections: Moderately Integrated (07/14/2022)   Social Connection and Isolation Panel [NHANES]    Frequency of Communication with Friends and Family: More than three times a week    Frequency of Social Gatherings with Friends and Family: More than three times a week    Attends Religious Services: 1 to 4 times per year    Active Member of [employer] or Organizations: No    Attends Engineer, structural: Not on file    Marital Status: Married     Review of Systems  Constitutional:  Negative for appetite change and unexpected weight change.  HENT:  Negative for congestion and sinus pressure.   Respiratory:  Negative for cough, chest tightness and shortness of breath.   Cardiovascular:  Negative for chest pain and palpitations.  Gastrointestinal:  Negative for abdominal pain, diarrhea, nausea and vomiting.  Genitourinary:  Negative for difficulty urinating and dysuria.  Musculoskeletal:  Negative for joint swelling and myalgias.  Skin:  Negative for color change and rash.  Neurological:  Negative for dizziness and headaches.  Psychiatric/Behavioral:  Negative for agitation and dysphoric mood.        Objective:     BP 118/70   Pulse 76   Temp 98.2 F (36.8 C)   Resp 16   Ht 5' 1.5" (1.562 m)   Wt 132 lb (59.9 kg)   SpO2 97%   BMI 24.54 kg/m  Wt Readings from Last 3 Encounters:  10/09/22 132 lb (59.9 kg)  08/10/22 128 lb (58.1 kg)  07/29/22 127 lb (57.6 kg)    Physical Exam Vitals reviewed.  Constitutional:      General: She is not in acute distress.    Appearance: Normal appearance.  HENT:     Head: Normocephalic and atraumatic.     Right Ear: External ear normal.     Left Ear: External ear normal.  Eyes:     General: No scleral icterus.       Right eye: No discharge.        Left eye: No discharge.     Conjunctiva/sclera: Conjunctivae normal.  Neck:     Thyroid: No thyromegaly.   Cardiovascular:     Rate and Rhythm: Normal rate and regular rhythm.  Pulmonary:     Effort: No respiratory distress.     Breath sounds: Normal breath sounds. No wheezing.  Abdominal:     General: Bowel sounds are normal.     Palpations: Abdomen is soft.  Tenderness: There is no abdominal tenderness.  Musculoskeletal:        General: No swelling or tenderness.     Cervical back: Neck supple. No tenderness.  Lymphadenopathy:     Cervical: No cervical adenopathy.  Skin:    Findings: No erythema or rash.  Neurological:     Mental Status: She is alert.  Psychiatric:        Mood and Affect: Mood normal.        Behavior: Behavior normal.      Outpatient Encounter Medications as of 10/09/2022  Medication Sig   potassium chloride (KLOR-CON) 10 MEQ tablet Take 10 mEq by mouth daily.   albuterol (VENTOLIN HFA) 108 (90 Base) MCG/ACT inhaler Inhale 2 puffs into the lungs every 6 (six) hours as needed for wheezing or shortness of breath.   anastrozole (ARIMIDEX) 1 MG tablet Take 1 tablet (1 mg total) by mouth daily.   busPIRone (BUSPAR) 10 MG tablet Take 1 tablet (10 mg total) by mouth 3 (three) times daily.   citalopram (CELEXA) 40 MG tablet Take 1 tablet (40 mg total) by mouth daily.   clonazePAM (KLONOPIN) 0.5 MG tablet Take 1 tablet (0.5 mg total) by mouth 2 (two) times daily.   pravastatin (PRAVACHOL) 20 MG tablet Take 1 tablet (20 mg total) by mouth daily.   QUEtiapine (SEROQUEL) 50 MG tablet Take 1 tablet (50 mg total) by mouth at bedtime.   risperiDONE (RISPERDAL) 0.5 MG tablet Take 1 tablet (0.5 mg total) by mouth 2 (two) times daily at 8 am and 4 pm.   Tiotropium Bromide-Olodaterol (STIOLTO RESPIMAT) 2.5-2.5 MCG/ACT AERS Inhale 2 puffs into the lungs daily.   traZODone (DESYREL) 50 MG tablet Take 25-50 mg by mouth at bedtime as needed for sleep.   triamterene-hydrochlorothiazide (MAXZIDE-25) 37.5-25 MG tablet Take 1 tablet by mouth daily each morning for fluid retention.    [DISCONTINUED] potassium chloride SA (KLOR-CON M) 20 MEQ tablet Take 1 tablet (20 mEq total) by mouth daily.   No facility-administered encounter medications on file as of 10/09/2022.     Lab Results  Component Value Date   WBC 10.3 07/15/2022   HGB 12.0 07/15/2022   HCT 35.7 (L) 07/15/2022   PLT 244 07/15/2022   GLUCOSE 83 10/07/2022   CHOL 154 10/07/2022   TRIG 67.0 10/07/2022   HDL 77.00 10/07/2022   LDLDIRECT 148.4 11/03/2012   LDLCALC 64 10/07/2022   ALT 15 10/07/2022   AST 23 10/07/2022   NA 140 10/07/2022   K 3.7 10/07/2022   CL 99 10/07/2022   CREATININE 0.94 10/07/2022   BUN 16 10/07/2022   CO2 35 (H) 10/07/2022   TSH 0.798 07/15/2022   HGBA1C 5.4 10/07/2022    No results found.     Assessment & Plan:  Need for influenza vaccination -     Flu Vaccine Trivalent High Dose (Fluad)  Anxiety Assessment & Plan: Admitted 07/15/22 - 07/20/22 - after presenting with increased anxiety.  Per discharge summary - on buspar 10mg  tid, citalopram, seroquel, risperdal, and clonazepam.  She is accompanied by her husband.  History obtained from both of them.  She is doing better.  Feeling better.  Anxiety better.  Eating better.  Breathing stable. Continue f/u with psychiatry.    Mild intermittent asthma without complication Assessment & Plan: Breathing stable.  Continue stiolto.  Has rescue inhaler if needed.     Essential hypertension, benign Assessment & Plan: Blood pressure as outlined.  On triam/hctz.  Follow pressures.  Follow metabolic panel.    Gastroesophageal reflux disease with esophagitis without hemorrhage Assessment & Plan: No upper symptoms reported.  Continue PPI.    Hypercholesterolemia Assessment & Plan: On pravastatin.  Low cholesterol diet and exercise.  Follow lipid panel and liver function tests.     Hyperglycemia Assessment & Plan: Follow met b and A1c.    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female, estrogen receptor  positive [facility]) Assessment & Plan: Saw oncology 11/27/21 - recommended continuing taloxifen 10mg  q hs.  Also mammogram and Dexa 12/24/21.  Continue f/u with oncology.  Need to confirm if taking tamoxifen.       Dale Prentiss, MD

## 2022-10-11 ENCOUNTER — Encounter: Payer: Self-pay | Admitting: Internal Medicine

## 2022-10-11 NOTE — Assessment & Plan Note (Signed)
On pravastatin.  Low cholesterol diet and exercise.  Follow lipid panel and liver function tests.

## 2022-10-11 NOTE — Assessment & Plan Note (Signed)
Admitted 07/15/22 - 07/20/22 - after presenting with increased anxiety.  Per discharge summary - on buspar 10mg  tid, citalopram, seroquel, risperdal, and clonazepam.  She is accompanied by her husband.  History obtained from both of them.  She is doing better.  Feeling better.  Anxiety better.  Eating better.  Breathing stable. Continue f/u with psychiatry.

## 2022-10-11 NOTE — Assessment & Plan Note (Signed)
Follow met b and A1c.

## 2022-10-11 NOTE — Assessment & Plan Note (Signed)
Blood pressure as outlined.  On triam/hctz.  Follow pressures.  Follow metabolic panel.

## 2022-10-11 NOTE — Assessment & Plan Note (Signed)
No upper symptoms reported.  Continue PPI.  ?

## 2022-10-11 NOTE — Assessment & Plan Note (Signed)
Breathing stable.  Continue stiolto.  Has rescue inhaler if needed.

## 2022-10-11 NOTE — Assessment & Plan Note (Addendum)
Saw oncology 11/27/21 - recommended continuing taloxifen 10mg  q hs.  Also mammogram and Dexa 12/24/21.  Continue f/u with oncology.  Need to confirm if taking tamoxifen.

## 2022-10-12 ENCOUNTER — Other Ambulatory Visit: Payer: Self-pay | Admitting: Internal Medicine

## 2022-10-12 ENCOUNTER — Telehealth: Payer: Self-pay | Admitting: Internal Medicine

## 2022-10-12 NOTE — Telephone Encounter (Signed)
LMTCB

## 2022-10-12 NOTE — Telephone Encounter (Signed)
Patient waiting on medication for nasal congestion and SOB. She stated she spoke with Dr. Lorin Picket last week , ,however, she has not received any medication at her pharmacy.

## 2022-10-12 NOTE — Telephone Encounter (Signed)
She was having nasal congestion.  No chest congestion - lungs were clear.  The sob - was not being able to breathe through her nose.  I spoke to her about using nasacort nasal spray - 2 sprays each nostril one time per day.  If over the counter.  Also can use saline nasal spray - flush nose 1-2x/day.

## 2022-10-12 NOTE — Progress Notes (Signed)
Opened in error

## 2022-10-13 NOTE — Telephone Encounter (Signed)
Spoke with pt. Pt stated she's never used nasacort before. Informed pt to just use saline nasal spray. And to let us know if that helps

## 2022-10-15 DIAGNOSIS — J449 Chronic obstructive pulmonary disease, unspecified: Secondary | ICD-10-CM | POA: Diagnosis not present

## 2022-10-28 ENCOUNTER — Telehealth: Payer: Self-pay | Admitting: Adult Health

## 2022-10-28 NOTE — Telephone Encounter (Signed)
Per staff message sent out on 10/26/2022 patient is aware of scheduled appointment times/dates with Provider

## 2022-11-02 ENCOUNTER — Telehealth: Payer: Self-pay

## 2022-11-02 DIAGNOSIS — F411 Generalized anxiety disorder: Secondary | ICD-10-CM

## 2022-11-02 DIAGNOSIS — F3342 Major depressive disorder, recurrent, in full remission: Secondary | ICD-10-CM

## 2022-11-02 MED ORDER — CITALOPRAM HYDROBROMIDE 40 MG PO TABS: 40.0000 mg | ORAL_TABLET | Freq: Every day | ORAL | 1 refills | Status: DC

## 2022-11-02 NOTE — Telephone Encounter (Signed)
Vernona Rieger with [facility] called requesting a refill for the patients    citalopram (CELEXA) 40 MG tablet  She stated that the patient was prescribed this medication when she was inpatient and the provider is not going to refill it she also mentioned that the patient is almost out of the medication and would like to know if you woud send in a new rx for the medication    Preferred pharmacy  [facility] - [place], [place] - [address] [phone]

## 2022-11-02 NOTE — Telephone Encounter (Signed)
I have sent Celexa to pharmacy.

## 2022-11-03 ENCOUNTER — Ambulatory Visit: Payer: Medicare Other | Attending: Student in an Organized Health Care Education/Training Program

## 2022-11-04 ENCOUNTER — Telehealth: Payer: Self-pay

## 2022-11-04 DIAGNOSIS — F3342 Major depressive disorder, recurrent, in full remission: Secondary | ICD-10-CM

## 2022-11-04 DIAGNOSIS — F411 Generalized anxiety disorder: Secondary | ICD-10-CM

## 2022-11-04 MED ORDER — QUETIAPINE FUMARATE 50 MG PO TABS: 50.0000 mg | ORAL_TABLET | Freq: Every day | ORAL | 3 refills | Status: DC

## 2022-11-04 MED ORDER — RISPERIDONE 0.5 MG PO TABS
0.5000 mg | ORAL_TABLET | ORAL | 3 refills | Status: DC
Start: 2022-11-04 — End: 2023-02-02

## 2022-11-04 NOTE — Addendum Note (Signed)
Addended byJomarie Longs on: 11/04/2022 03:10 PM   Modules accepted: Orders

## 2022-11-04 NOTE — Telephone Encounter (Signed)
I have sent risperidone and Seroquel to pharmacy.  Celexa was already sent down to pharmacy on 11/02/2022.

## 2022-11-04 NOTE — Telephone Encounter (Signed)
[facility] called requesting a refill for the following medication which were previously prescribed when patient was inpatient and the provider will not refill the medication please advise  risperiDONE (RISPERDAL) 0.5 MG tablet   QUEtiapine (SEROQUEL) 50 MG tablet .  citalopram (CELEXA) 40 MG tablet   Last visit 09/09/22 Next visit 12/10/22  Preferred pharmacy [facility] - [place], [place] - [address] Phone: [phone]  Fax: [phone]

## 2022-11-09 DIAGNOSIS — H18413 Arcus senilis, bilateral: Secondary | ICD-10-CM | POA: Diagnosis not present

## 2022-11-09 DIAGNOSIS — H2511 Age-related nuclear cataract, right eye: Secondary | ICD-10-CM | POA: Diagnosis not present

## 2022-11-09 DIAGNOSIS — H02839 Dermatochalasis of unspecified eye, unspecified eyelid: Secondary | ICD-10-CM | POA: Diagnosis not present

## 2022-11-10 ENCOUNTER — Ambulatory Visit: Payer: Medicare Other | Admitting: Student in an Organized Health Care Education/Training Program

## 2022-11-14 DIAGNOSIS — J449 Chronic obstructive pulmonary disease, unspecified: Secondary | ICD-10-CM | POA: Diagnosis not present

## 2022-11-20 ENCOUNTER — Encounter: Payer: Self-pay | Admitting: Adult Health

## 2022-11-20 ENCOUNTER — Inpatient Hospital Stay: Payer: Medicare Other | Attending: Adult Health

## 2022-11-20 ENCOUNTER — Inpatient Hospital Stay (HOSPITAL_BASED_OUTPATIENT_CLINIC_OR_DEPARTMENT_OTHER): Payer: Medicare Other | Admitting: Adult Health

## 2022-11-20 VITALS — BP 118/67 | HR 86 | Temp 97.5°F | Resp 16 | Wt 136.7 lb

## 2022-11-20 DIAGNOSIS — Z1721 Progesterone receptor positive status: Secondary | ICD-10-CM | POA: Insufficient documentation

## 2022-11-20 DIAGNOSIS — Z170 Estrogen receptor positive status [ER+]: Secondary | ICD-10-CM | POA: Diagnosis not present

## 2022-11-20 DIAGNOSIS — Z79811 Long term (current) use of aromatase inhibitors: Secondary | ICD-10-CM | POA: Insufficient documentation

## 2022-11-20 DIAGNOSIS — M8580 Other specified disorders of bone density and structure, unspecified site: Secondary | ICD-10-CM | POA: Diagnosis not present

## 2022-11-20 DIAGNOSIS — C50411 Malignant neoplasm of upper-outer quadrant of right female breast: Secondary | ICD-10-CM

## 2022-11-20 LAB — CBC WITH DIFFERENTIAL (CANCER CENTER ONLY)
Abs Immature Granulocytes: 0.03 10*3/uL (ref 0.00–0.07)
Basophils Absolute: 0.1 10*3/uL (ref 0.0–0.1)
Basophils Relative: 1 %
Eosinophils Absolute: 0.2 10*3/uL (ref 0.0–0.5)
Eosinophils Relative: 2 %
HCT: 37.5 % (ref 36.0–46.0)
Hemoglobin: 12.4 g/dL (ref 12.0–15.0)
Immature Granulocytes: 0 %
Lymphocytes Relative: 24 %
Lymphs Abs: 2.5 10*3/uL (ref 0.7–4.0)
MCH: 31.6 pg (ref 26.0–34.0)
MCHC: 33.1 g/dL (ref 30.0–36.0)
MCV: 95.7 fL (ref 80.0–100.0)
Monocytes Absolute: 1 10*3/uL (ref 0.1–1.0)
Monocytes Relative: 10 %
Neutro Abs: 6.7 10*3/uL (ref 1.7–7.7)
Neutrophils Relative %: 63 %
Platelet Count: 201 10*3/uL (ref 150–400)
RBC: 3.92 MIL/uL (ref 3.87–5.11)
RDW: 12.1 % (ref 11.5–15.5)
WBC Count: 10.4 10*3/uL (ref 4.0–10.5)
nRBC: 0 % (ref 0.0–0.2)

## 2022-11-20 LAB — CMP (CANCER CENTER ONLY)
ALT: 9 U/L (ref 0–44)
AST: 16 U/L (ref 15–41)
Albumin: 4 g/dL (ref 3.5–5.0)
Alkaline Phosphatase: 57 U/L (ref 38–126)
Anion gap: 6 (ref 5–15)
BUN: 16 mg/dL (ref 8–23)
CO2: 33 mmol/L — ABNORMAL HIGH (ref 22–32)
Calcium: 9.7 mg/dL (ref 8.9–10.3)
Chloride: 100 mmol/L (ref 98–111)
Creatinine: 0.92 mg/dL (ref 0.44–1.00)
GFR, Estimated: >60 mL/min (ref >60)
Glucose, Bld: 93 mg/dL (ref 70–99)
Potassium: 3.6 mmol/L (ref 3.5–5.1)
Sodium: 139 mmol/L (ref 135–145)
Total Bilirubin: 0.8 mg/dL (ref 0.3–1.2)
Total Protein: 6.9 g/dL (ref 6.5–8.1)

## 2022-11-20 NOTE — Assessment & Plan Note (Signed)
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  Breast Cancer Transitioned from Tamoxifen to Anastrozole with no reported side effects. No breast concerns on physical examination. -Continue Anastrozole daily. -Schedule bilateral breast diagnostic mammogram next month; orders placed today.  Osteopenia Bone density testing last year showed osteopenia. No changes in activity level or mobility. -Continue calcium and vitamin D supplementation. -Encourage weight-bearing exercises. -Repeat bone density testing next year.  Anxiety Reports seeing a mental health professional in [place]. No changes in anxiety level. -Continue current mental health management.  General Health Maintenance -Continue healthy diet with plenty of fruits and vegetables. -Ensure 150 minutes of exercise per week. -Follow up with primary care provider as scheduled -Return for follow-up in 6 months or sooner if new side effects from Anastrozole develop, or any breast changes or concerns for cancer recurrence.

## 2022-11-20 NOTE — Progress Notes (Signed)
[facility] Cancer Follow up:    [name], MD [address] [place] [address]   DIAGNOSIS:  Cancer Staging  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female, estrogen receptor positive (HCC) Staging form: Breast, AJCC 8th Edition - Clinical stage from 11/14/2020: Stage IA (cT1c, cN0, cM0, G2, ER+, PR+, HER2-) - Signed by Malachy Mood, MD on 12/17/2020 Stage prefix: Initial diagnosis Histologic grading system: 3 grade system - Pathologic stage from 12/27/2020: Stage IA (pT1c, pN0, cM0, G2, ER+, PR+, HER2-) - Signed by Malachy Mood, MD on 02/25/2021 Stage prefix: Initial diagnosis Histologic grading system: 3 grade system Residual tumor (R): R0 - None   SUMMARY OF ONCOLOGIC HISTORY: Oncology History Overview Note   Cancer Staging  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female, estrogen receptor positive (HCC) Staging form: Breast, AJCC 8th Edition - Clinical stage from 11/14/2020: Stage IA (cT1c, cN0, cM0, G2, ER+, PR+, HER2-) - Signed by Malachy Mood, MD on 12/17/2020 Stage prefix: Initial diagnosis Histologic grading system: 3 grade system - Pathologic stage from 12/27/2020: Stage IA (pT1c, pN0, cM0, G2, ER+, PR+, HER2-) - Signed by Malachy Mood, MD on 02/25/2021 Stage prefix: Initial diagnosis Histologic grading system: 3 grade system Residual tumor (R): R0 - None     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female, estrogen receptor positive (HCC)  11/04/2020 Mammogram   EXAM: DIGITAL DIAGNOSTIC UNILATERAL RIGHT MAMMOGRAM WITH TOMOSYNTHESIS AND CAD; ULTRASOUND RIGHT BREAST LIMITED  FINDINGS: Targeted ultrasound is performed, showing irregular hypoechoic mass at the right breast 11 o'clock 5 cm from nipple measuring 1 x 0.5 x 1.2 cm correlating to the mammographic mass. Ultrasound of the right axilla is negative.   11/14/2020 Cancer Staging   Staging form: Breast, AJCC 8th  Edition - Clinical stage from 11/14/2020: Stage IA (cT1c, cN0, cM0, G2, ER+, PR+, HER2-) - Signed by Malachy Mood, MD on 12/17/2020 Stage prefix: Initial diagnosis Histologic grading system: 3 grade system   11/14/2020 Pathology Results   DIAGNOSIS:  A. BREAST, RIGHT AT 11:00, 5 CM FROM THE NIPPLE; ULTRASOUND-GUIDED CORE NEEDLE BIOPSY:  - INVASIVE MAMMARY CARCINOMA, NO SPECIAL TYPE.  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female, estrogen receptor positive (HCC)   12/27/2020 Cancer Staging   Staging form: Breast, AJCC 8th Edition - Pathologic stage from 12/27/2020: Stage IA (pT1c, pN0, cM0, G2, ER+, PR+, HER2-) - Signed by Malachy Mood, MD on 02/25/2021 Stage prefix: Initial diagnosis Histologic grading system: 3 grade system Residual tumor (R): R0 - None   12/27/2020 Surgery   Right lumpectomy: IDC g2, 1.2cm, margins negative, 2 SLN negative   02/03/2021 - 02/28/2021 Radiation Therapy   Site Technique Total Dose (Gy) Dose per Fx (Gy) Completed Fx Beam Energies  Breast, Right: Breast_R 3D 40.05/40.05 2.67 15/15 6XFFF  Breast, Right: Breast_R_Bst 3D 10/10 2 5/5 6X     03/19/2021 -  Anti-estrogen oral therapy   20 mg Tamoxifen     CURRENT THERAPY: Anastrozole  INTERVAL HISTORY:  Discussed the use of AI scribe software for clinical note transcription with the patient, who gave verbal consent to proceed.  Jennifer [name] 77 y.o. female previously diagnosed with  breast cancer, has been transitioned from tamoxifen to anastrozole. She reports adherence to the new medication regimen without experiencing any notable side effects such as hot flashes, vaginal dryness, or joint aches and pains. The patient's last mammogram was in December 2023, and she is due for another one next month. Bone density testing conducted last year revealed osteopenia.  In addition to her cancer treatment, the patient has been experiencing anxiety, for which she is seeing a mental health professional in [place]. The patient did not recall the name of the professional during the consultation. Despite the anxiety, the patient reports maintaining an active lifestyle and being able to perform daily tasks independently.  The patient also reports some memory issues and has been taking Prevagen, a supplement purported to support memory. She does not take it as regularly as her partner and is unsure of its effectiveness. The patient has not reported any new health changes since the last consultation. She has not experienced any cough, shortness of breath, chest pain, or irregular heartbeats. She reports an increased appetite but no changes in bowel or bladder function.   Patient Active Problem List   Diagnosis Date Noted   High risk medication use 09/09/2022   Overuse of medication 06/10/2022   GAD (generalized anxiety disorder) 04/08/2022   Depression 04/08/2022   Anxiety 11/02/2021   Decreased appetite 10/07/2021   Osteopenia 08/22/2021   Ear pain, right 06/13/2021   Tremor 06/07/2021   Hypokalemia 06/07/2021   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female, 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   Nasal congestion 02/04/2020   Sore throat 01/25/2020   Neck pain 08/19/2019   B12 deficiency 08/19/2019   Knee pain 02/04/2019   Memory change 02/04/2019   Radiculopathy of cervical spine 04/20/2017   Spondylosis of cervical spine 04/19/2017   Ear pain,  left 03/27/2017   Right arm pain 11/12/2016   Vertigo 09/02/2016   Left low back pain 05/22/2015   Left hip pain 05/22/2015   SOB (shortness of breath) 11/11/2014   Right shoulder pain 07/16/2014   Health care maintenance 06/03/2014   Arm skin lesion, left 06/18/2013   URI (upper respiratory infection) 03/03/2013   Hyperglycemia 11/12/2012   Hypercholesterolemia 11/12/2012   Essential hypertension, benign 05/12/2012   GERD (gastroesophageal reflux disease) 05/12/2012   Gastritis 05/12/2012   History of colonic polyps 05/12/2012   Asthma 12/13/2011   Chronic restrictive lung disease 05/11/2011    is allergic to advair diskus [fluticasone-salmeterol], codeine, and pollen extract.  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 Antony Blackbird   Hypertension    PONV (postoperative nausea and vomiting)    Scoliosis    Ulcer  SURGICAL HISTORY: Past Surgical History:  Procedure Laterality Date   ABDOMINAL HYSTERECTOMY     ANTERIOR CERVICAL DECOMP/DISCECTOMY FUSION N/A 04/19/2017   Procedure: Anterior Cervical Decompression Fusion Cervical Three-Four, Cervical Four-Five, Cervical Five-Six;  Surgeon: Donalee Citrin, MD;  Location[facility] OR;  Service: Neurosurgery;  Laterality: N/A;  Anterior Cervical Decompression Fusion Cervical Three-Four, Cervical Four-Five, Cervical Five-Six   BREAST BIOPSY Right 11/14/2020   [place] Bx, Venus Clip, Invasive Mammary Carcinoma   BREAST LUMPECTOMY Right 2022   With radiation   BREAST LUMPECTOMY WITH RADIOACTIVE SEED AND SENTINEL LYMPH NODE BIOPSY Right 12/27/2020   Procedure: RIGHT BREAST LUMPECTOMY WITH RADIOACTIVE SEED AND  AXILLARY SENTINEL LYMPH NODE BIOPSY;  Surgeon: Emelia Loron, MD;  Location: [facility] OR;  Service: General;  Laterality: Right;   CHOLECYSTECTOMY  1996   DILATION AND CURETTAGE OF UTERUS  1971   LAPAROSCOPIC REMOVAL OF MESENTERIC MASS     LUMBAR DISC SURGERY  1998   [facility]   PARTIAL HYSTERECTOMY  1981   prolapsed uterus   TUBAL LIGATION  1978    SOCIAL HISTORY: Social History   Socioeconomic History   Marital status: Married    Spouse name: Mariana Kaufman "[name]"   Number of children: 2   Years of education: Not on file   Highest education level: 12th grade  Occupational History   Occupation: retired Runner, broadcasting/film/video  Tobacco Use   Smoking status: Never   Smokeless tobacco: Never  Vaping Use   Vaping status: Never Used  Substance and Sexual Activity   Alcohol use: No    Alcohol/week: 0.0 standard drinks of alcohol   Drug use: No   Sexual activity: Not Currently    Comment: not asked if sexually active  Other Topics Concern   Not on file  Social History Narrative   Right handed    Lives with husband   Had breast cancer surgery right side   Worked in the school system    Social Determinants of Health   Financial Resource Strain: Low Risk  (02/13/2022)   Overall Financial Resource Strain (CARDIA)    Difficulty of Paying Living Expenses: Not hard at all  Food Insecurity: No Food Insecurity (07/15/2022)   Hunger Vital Sign    Worried About Running Out of Food in the Last Year: Never true    Ran Out of Food in the Last Year: Never true  Transportation Needs: No Transportation Needs (07/15/2022)   PRAPARE - Administrator, Civil Service (Medical): No    Lack of Transportation (Non-Medical): No  Physical Activity: Unknown (07/14/2022)   Exercise Vital Sign    Days of Exercise per Week: 0 days    Minutes of Exercise per Session: Not on file  Stress: Stress Concern Present (07/14/2022)   [employer] of Occupational Health - Occupational Stress Questionnaire    Feeling of  Stress : Very much  Social Connections: Moderately Integrated (07/14/2022)   Social Connection and Isolation Panel [NHANES]    Frequency of Communication with Friends and Family: More than three times a week    Frequency of Social Gatherings with Friends and Family: More than three times a week    Attends Religious Services: 1 to 4 times per year    Active Member of [employer] or Organizations: No    Attends Banker Meetings: Not on file    Marital Status: Married  Intimate Partner Violence: Not At Risk (07/15/2022)   Humiliation, Afraid, Rape, and Kick questionnaire    Fear of  Current or Ex-Partner: No    Emotionally Abused: No    Physically Abused: No    Sexually Abused: No    FAMILY HISTORY: Family History  Problem Relation Age of Onset   Stroke Mother    Cancer Father        unknown type   Cancer Brother        "blood cancer"   CVA Maternal Grandmother    Healthy Son    Breast cancer Neg Hx    Colon cancer Neg Hx    Mental illness Neg Hx     Review of Systems  Constitutional:  Negative for appetite change, chills, fatigue, fever and unexpected weight change.  HENT:   Negative for hearing loss, lump/mass and trouble swallowing.   Eyes:  Negative for eye problems and icterus.  Respiratory:  Negative for chest tightness, cough and shortness of breath.   Cardiovascular:  Negative for chest pain, leg swelling and palpitations.  Gastrointestinal:  Negative for abdominal distention, abdominal pain, constipation, diarrhea, nausea and vomiting.  Endocrine: Negative for hot flashes.  Genitourinary:  Negative for difficulty urinating.   Musculoskeletal:  Negative for arthralgias.  Skin:  Negative for itching and rash.  Neurological:  Negative for dizziness, extremity weakness, headaches and numbness.  Hematological:  Negative for adenopathy. Does not bruise/bleed easily.  Psychiatric/Behavioral:  Negative for depression. The patient is not nervous/anxious.        PHYSICAL EXAMINATION    Vitals:   11/20/22 1337  BP: 118/67  Pulse: 86  Resp: 16  Temp: (!) 97.5 F (36.4 C)  SpO2: 98%    Physical Exam Constitutional:      General: She is not in acute distress.    Appearance: Normal appearance. She is not toxic-appearing.  HENT:     Head: Normocephalic and atraumatic.     Mouth/Throat:     Mouth: Mucous membranes are moist.     Pharynx: Oropharynx is clear. No oropharyngeal exudate or posterior oropharyngeal erythema.  Eyes:     General: No scleral icterus. Cardiovascular:     Rate and Rhythm: Normal rate and regular rhythm.     Pulses: Normal pulses.     Heart sounds: Normal heart sounds.  Pulmonary:     Effort: Pulmonary effort is normal.     Breath sounds: Normal breath sounds.  Chest:     Comments: Right breast status post ectomy and radiation no sign of local recurrence left breast is benign Abdominal:     General: Abdomen is flat. Bowel sounds are normal. There is no distension.     Palpations: Abdomen is soft.     Tenderness: There is no abdominal tenderness.  Musculoskeletal:        General: No swelling.     Cervical back: Neck supple.  Lymphadenopathy:     Cervical: No cervical adenopathy.     Upper Body:     Right upper body: No axillary adenopathy.     Left upper body: No axillary adenopathy.  Skin:    General: Skin is warm and dry.     Findings: No rash.  Neurological:     General: No focal deficit present.     Mental Status: She is alert.  Psychiatric:        Mood and Affect: Mood normal.        Behavior: Behavior normal.     LABORATORY DATA:  CBC    Component Value Date/Time   WBC 10.4 11/20/2022 1324   WBC 10.3  07/15/2022 1815   RBC 3.92 11/20/2022 1324   HGB 12.4 11/20/2022 1324   HCT 37.5 11/20/2022 1324   PLT 201 11/20/2022 1324   MCV 95.7 11/20/2022 1324   MCH 31.6 11/20/2022 1324   MCHC 33.1 11/20/2022 1324   RDW 12.1 11/20/2022 1324   LYMPHSABS 2.5 11/20/2022 1324   MONOABS 1.0  11/20/2022 1324   EOSABS 0.2 11/20/2022 1324   BASOSABS 0.1 11/20/2022 1324    CMP  Pending at time of appointment completion   ASSESSMENT and THERAPY PLAN:   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female, estrogen receptor positive (HCC) Hisako is a 77 year old woman with history of right breast stage Ia ER/PR positive breast cancer diagnosed in November 2022 status postlumpectomy followed by adjuvant radiation and antiestrogen therapy with tamoxifen that began in March 2023 and was subsequently transitioned to anastrozole.  Breast Cancer Transitioned from Tamoxifen to Anastrozole with no reported side effects. No breast concerns on physical examination. -Continue Anastrozole daily. -Schedule bilateral breast diagnostic mammogram next month; orders placed today.  Osteopenia Bone density testing last year showed osteopenia. No changes in activity level or mobility. -Continue calcium and vitamin D supplementation. -Encourage weight-bearing exercises. -Repeat bone density testing next year.  Anxiety Reports seeing a mental health professional in [place]. No changes in anxiety level. -Continue current mental health management.  General Health Maintenance -Continue healthy diet with plenty of fruits and vegetables. -Ensure 150 minutes of exercise per week. -Follow up with primary care provider as scheduled -Return for follow-up in 6 months or sooner if new side effects from Anastrozole develop, or any breast changes or concerns for cancer recurrence.   All questions were answered. The patient knows to call the clinic with any problems, questions or concerns. We can certainly see the patient much sooner if necessary.  Total encounter time:20 minutes*in face-to-face visit time, chart review, lab review, care coordination, order entry, and documentation of the encounter time.    Lillard Anes, NP 11/20/22 2:02 PM Medical Oncology and Hematology [facility] [address] [place], [place] [address] Tel. [phone]    Fax. [phone]  *Total Encounter Time as defined by the Centers for Medicare and Medicaid Services includes, in addition to the face-to-face time of a patient visit (documented in the note above) non-face-to-face time: obtaining and reviewing outside history, ordering and reviewing medications, tests or procedures, care coordination (communications with other health care professionals or caregivers) and documentation in the medical record.

## 2022-11-23 ENCOUNTER — Telehealth: Payer: Self-pay | Admitting: Adult Health

## 2022-11-23 ENCOUNTER — Telehealth: Payer: Self-pay

## 2022-11-23 NOTE — Telephone Encounter (Signed)
Called back and review appts. She verbalized understanding.

## 2022-11-23 NOTE — Telephone Encounter (Signed)
Spoke with patient confirming upcoming appointment  

## 2022-11-25 ENCOUNTER — Other Ambulatory Visit: Payer: Self-pay | Admitting: Psychiatry

## 2022-11-25 DIAGNOSIS — F321 Major depressive disorder, single episode, moderate: Secondary | ICD-10-CM

## 2022-11-25 DIAGNOSIS — F411 Generalized anxiety disorder: Secondary | ICD-10-CM

## 2022-11-26 ENCOUNTER — Ambulatory Visit: Payer: Medicare Other

## 2022-12-02 DIAGNOSIS — H2512 Age-related nuclear cataract, left eye: Secondary | ICD-10-CM | POA: Diagnosis not present

## 2022-12-07 ENCOUNTER — Other Ambulatory Visit: Payer: Self-pay | Admitting: Psychiatry

## 2022-12-07 DIAGNOSIS — F411 Generalized anxiety disorder: Secondary | ICD-10-CM

## 2022-12-08 ENCOUNTER — Ambulatory Visit: Payer: Medicare Other | Admitting: Student in an Organized Health Care Education/Training Program

## 2022-12-10 ENCOUNTER — Ambulatory Visit: Payer: BC Managed Care – PPO | Admitting: Psychiatry

## 2022-12-15 DIAGNOSIS — J449 Chronic obstructive pulmonary disease, unspecified: Secondary | ICD-10-CM | POA: Diagnosis not present

## 2022-12-21 ENCOUNTER — Other Ambulatory Visit: Payer: Self-pay | Admitting: Psychiatry

## 2022-12-21 DIAGNOSIS — F411 Generalized anxiety disorder: Secondary | ICD-10-CM

## 2022-12-21 DIAGNOSIS — F321 Major depressive disorder, single episode, moderate: Secondary | ICD-10-CM

## 2022-12-28 ENCOUNTER — Ambulatory Visit: Admission: RE | Admit: 2022-12-28 | Payer: Medicare Other | Source: Ambulatory Visit

## 2022-12-31 ENCOUNTER — Ambulatory Visit: Admit: 2022-12-31 | Payer: Medicare Other | Admitting: Ophthalmology

## 2022-12-31 SURGERY — CATARACT EXTRACTION PHACO AND INTRAOCULAR LENS PLACEMENT (IOC)
Anesthesia: Topical | Laterality: Left

## 2023-01-06 ENCOUNTER — Other Ambulatory Visit: Payer: Self-pay | Admitting: Psychiatry

## 2023-01-06 DIAGNOSIS — F411 Generalized anxiety disorder: Secondary | ICD-10-CM

## 2023-01-14 DIAGNOSIS — J449 Chronic obstructive pulmonary disease, unspecified: Secondary | ICD-10-CM | POA: Diagnosis not present

## 2023-01-21 ENCOUNTER — Other Ambulatory Visit: Payer: Self-pay

## 2023-01-21 ENCOUNTER — Ambulatory Visit: Payer: Self-pay | Admitting: Internal Medicine

## 2023-01-21 DIAGNOSIS — J984 Other disorders of lung: Secondary | ICD-10-CM

## 2023-01-21 DIAGNOSIS — R0602 Shortness of breath: Secondary | ICD-10-CM

## 2023-01-21 MED ORDER — STIOLTO RESPIMAT 2.5-2.5 MCG/ACT IN AERS: 2.0000 | INHALATION_SPRAY | Freq: Every day | RESPIRATORY_TRACT | 11 refills | Status: DC

## 2023-01-21 MED ORDER — ALBUTEROL SULFATE HFA 108 (90 BASE) MCG/ACT IN AERS: 2.0000 | INHALATION_SPRAY | Freq: Four times a day (QID) | RESPIRATORY_TRACT | 2 refills | Status: AC | PRN

## 2023-01-21 NOTE — Telephone Encounter (Signed)
Refilled, patient aware  

## 2023-01-21 NOTE — Telephone Encounter (Signed)
 Need more information.  Confirm symptoms currently having.  Sounds like she needs to be seen

## 2023-01-21 NOTE — Telephone Encounter (Signed)
  Chief Complaint: Shortness of breath Symptoms: shortness of breath Frequency: one week Pertinent Negatives: Patient denies fever, chest pain,  Disposition: [] ED /[] Urgent Care (no appt availability in office) / [] Appointment(In office/virtual)/ []  [facility]/ [] Home Care/ [x] Refused Recommended Disposition /[] [place] [facility]/ []  Follow-up with PCP Additional Notes: patient c/o being short of breath. States this has been going on for a week and that she is out of her inhalers. Patient is able to speak in full sentences. Patient is wanting have her inhalers called in for her and/or be seen in the office. Per protocol, recommendation is for an office visit. Patient is refusing to see any other provider besides Dr.Scott. Dr.Scott doesn't have an appointment available until February. patient is requesting her inhalers to be refilled and a call from Dr.Scott's staff.    Copied from CRM [id number]. Topic: Clinical - Red Word Triage >> Jan 21, 2023 10:15 AM Isabell A wrote: [facility] that prompted transfer to Nurse Triage: Shortness of breath Reason for Disposition  [1] MODERATE longstanding difficulty breathing (e.g., speaks in phrases, SOB even at rest, pulse 100-120) AND [2] SAME as normal  Answer Assessment - Initial Assessment Questions 1. RESPIRATORY STATUS: Describe your breathing? (e.g., wheezing, shortness of breath, unable to speak, severe coughing)      Short of breath-patient states that she might be good breathing wise for a while and then she is having shortness of breath 2. ONSET: When did this breathing problem begin?      Started a week ago 3. PATTERN Does the difficult breathing come and go, or has it been constant since it started?      intermittent 4. SEVERITY: How bad is your breathing? (e.g., mild, moderate, severe)    - MILD: No SOB at rest, mild SOB with walking, speaks normally in sentences, can lie down, no retractions, pulse < 100.    - MODERATE:  SOB at rest, SOB with minimal exertion and prefers to sit, cannot lie down flat, speaks in phrases, mild retractions, audible wheezing, pulse 100-120.    - SEVERE: Very SOB at rest, speaks in single words, struggling to breathe, sitting hunched forward, retractions, pulse > 120      Mild 5. RECURRENT SYMPTOM: Have you had difficulty breathing before? If Yes, ask: When was the last time? and What happened that time?      Yes she has had breathing issues 6. CARDIAC HISTORY: Do you have any history of heart disease? (e.g., heart attack, angina, bypass surgery, angioplasty)      No 7. LUNG HISTORY: Do you have any history of lung disease?  (e.g., pulmonary embolus, asthma, emphysema)     Has breathing issues 8. CAUSE: What do you think is causing the breathing problem?      unsure 9. OTHER SYMPTOMS: Do you have any other symptoms? (e.g., dizziness, runny nose, cough, chest pain, fever)     No 10. O2 SATURATION MONITOR:  Do you use an oxygen  saturation monitor (pulse oximeter) at home? If Yes, ask: What is your reading (oxygen  level) today? What is your usual oxygen  saturation reading? (e.g., 95%)       no 12. TRAVEL: Have you traveled out of the country in the last month? (e.g., travel history, exposures)       no  Protocols used: Breathing Difficulty-A-AH

## 2023-01-21 NOTE — Telephone Encounter (Signed)
 Patient doing ok. She has been out of her inhalers for about a week and requested to come in and see you. She says her asthma is flaring up. Ok to refill her albuterol and stiolto? She is coming in to see you tomorrow at 9:30.

## 2023-01-21 NOTE — Telephone Encounter (Signed)
 [place] to refill

## 2023-01-22 ENCOUNTER — Encounter: Payer: Self-pay | Admitting: Internal Medicine

## 2023-01-22 ENCOUNTER — Telehealth: Payer: Self-pay | Admitting: Psychiatry

## 2023-01-22 ENCOUNTER — Ambulatory Visit (INDEPENDENT_AMBULATORY_CARE_PROVIDER_SITE_OTHER): Payer: Medicare Other | Admitting: Internal Medicine

## 2023-01-22 VITALS — BP 108/72 | HR 85 | Temp 98.0°F | Resp 16 | Ht 61.5 in | Wt 140.6 lb

## 2023-01-22 DIAGNOSIS — F419 Anxiety disorder, unspecified: Secondary | ICD-10-CM | POA: Diagnosis not present

## 2023-01-22 DIAGNOSIS — Z170 Estrogen receptor positive status [ER+]: Secondary | ICD-10-CM

## 2023-01-22 DIAGNOSIS — R0600 Dyspnea, unspecified: Secondary | ICD-10-CM

## 2023-01-22 DIAGNOSIS — C50411 Malignant neoplasm of upper-outer quadrant of right female breast: Secondary | ICD-10-CM | POA: Diagnosis not present

## 2023-01-22 NOTE — Telephone Encounter (Signed)
 Received a message from primary care provider that patient is having worsening anxiety symptoms and is currently at primary care provider's office. Discussed readjusting her medications after completing an EKG, if appropriate.  Could either increase her Seroquel  or increase the dosage of buspirone  as per discussion with primary care.  Will place this patient on a priority list for a sooner appointment for evaluation and management.

## 2023-01-22 NOTE — Progress Notes (Signed)
 Subjective:    [name], female    DOB: 05-08-1945, 78 y.o.   MRN: [id number]  Patient here for  Chief Complaint  Patient presents with   Medical Management of Chronic Issues    HPI Here as a work in appt - work in for asthma flare. Called in yesterday stating she has been out of her inhalers - albuterol  and stiolto). Seeing oncology for f/u breast cancer - last seen 11/20/22. Transitioned from tamoxifen  to anastrozole . She is accompanied by her husband today. History obtained from both of them. Reports that recently she has been more anxious. Increased anxiety and increased crying spells - cries very easily. Husband reports she feels better when she goes out riding around with him or out to eat. Anxiety has worsened. Sees psychiatry. Discussed follow up with psychiatry. Denies depression. Denies SI. Breathing stable. No chest pain. Denies any pain. Eating.  Bowels moving.    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 Lynwood Nasuti   Hypertension    PONV (postoperative nausea and vomiting)    Scoliosis    Ulcer    Past Surgical History:  Procedure Laterality Date   ABDOMINAL HYSTERECTOMY     ANTERIOR CERVICAL DECOMP/DISCECTOMY FUSION N/A 04/19/2017   Procedure: Anterior Cervical Decompression Fusion Cervical Three-Four, Cervical Four-Five, Cervical Five-Six;  Surgeon: Onetha Kuba, MD;  Location: [facility] OR;  Service: Neurosurgery;  Laterality: N/A;  Anterior Cervical Decompression Fusion Cervical Three-Four, Cervical Four-Five, Cervical Five-Six   BREAST BIOPSY Right 11/14/2020   US  Bx, Venus Clip, Invasive Mammary Carcinoma   BREAST LUMPECTOMY Right 2022    With radiation   BREAST LUMPECTOMY WITH RADIOACTIVE SEED AND SENTINEL LYMPH NODE BIOPSY Right 12/27/2020   Procedure: RIGHT BREAST LUMPECTOMY WITH RADIOACTIVE SEED AND AXILLARY SENTINEL LYMPH NODE BIOPSY;  Surgeon: Ebbie Cough, MD;  Location: [facility] OR;  Service: General;  Laterality: Right;   CHOLECYSTECTOMY  1996   DILATION AND CURETTAGE OF UTERUS  1971   LAPAROSCOPIC REMOVAL OF MESENTERIC MASS     LUMBAR DISC SURGERY  1998   [facility]   PARTIAL HYSTERECTOMY  1981   prolapsed uterus   TUBAL LIGATION  1978   Family History  Problem Relation Age of Onset   Stroke Mother    Cancer Father        unknown type   Cancer Brother        blood cancer   CVA Maternal Grandmother    Healthy Son    Breast cancer Neg Hx    Colon cancer Neg Hx    Mental illness Neg Hx    Social History   Socioeconomic History   Marital status: Married    Spouse name: Ellender Burden   Number of children: 2   Years of education: Not on file   Highest education level: 12th grade  Occupational History   Occupation: retired runner, broadcasting/film/video  Tobacco Use   Smoking status: Never   Smokeless tobacco: Never  Vaping Use   Vaping status: Never Used  Substance and Sexual Activity   Alcohol use: No  Alcohol/week: 0.0 standard drinks of alcohol   Drug use: No   Sexual activity: Not Currently    Comment: not asked if sexually active  Other Topics Concern   Not on file  Social History Narrative   Right handed    Lives with husband   Had breast cancer surgery right side   Worked in the school system    Social Drivers of Health   Financial Resource Strain: Low Risk  (02/13/2022)   Overall Financial Resource Strain (CARDIA)    Difficulty of Paying Living Expenses: Not hard at all  Food Insecurity: No Food Insecurity (07/15/2022)   Hunger Vital Sign    Worried About Running Out of Food in the Last Year: Never true    Ran Out of Food in the Last Year: Never true  Transportation Needs: No Transportation Needs  (07/15/2022)   PRAPARE - Administrator, Civil Service (Medical): No    Lack of Transportation (Non-Medical): No  Physical Activity: Unknown (07/14/2022)   Exercise Vital Sign    Days of Exercise per Week: 0 days    Minutes of Exercise per Session: Not on file  Stress: Stress Concern Present (07/14/2022)   [employer] of Occupational Health - Occupational Stress Questionnaire    Feeling of Stress : Very much  Social Connections: Moderately Integrated (07/14/2022)   Social Connection and Isolation Panel [NHANES]    Frequency of Communication with Friends and Family: More than three times a week    Frequency of Social Gatherings with Friends and Family: More than three times a week    Attends Religious Services: 1 to 4 times per year    Active Member of [employer] or Organizations: No    Attends Engineer, Structural: Not on file    Marital Status: Married     Review of Systems  Constitutional:  Negative for appetite change and unexpected weight change.  HENT:  Negative for congestion and sinus pressure.   Respiratory:  Negative for cough, chest tightness and shortness of breath.   Cardiovascular:  Negative for chest pain and palpitations.  Gastrointestinal:  Negative for abdominal pain, diarrhea, nausea and vomiting.  Genitourinary:  Negative for difficulty urinating and dysuria.  Musculoskeletal:  Negative for joint swelling and myalgias.  Skin:  Negative for color change and rash.  Neurological:  Negative for dizziness and headaches.  Psychiatric/Behavioral:  Negative for agitation and dysphoric mood.        Increased anxiety as outlined.        Objective:     BP 108/72   Pulse 85   Temp 98 F (36.7 C)   Resp 16   Ht 5' 1.5 (1.562 m)   Wt 140 lb 9.6 oz (63.8 kg)   SpO2 97%   BMI 26.14 kg/m  Wt Readings from Last 3 Encounters:  01/22/23 140 lb 9.6 oz (63.8 kg)  11/20/22 136 lb 11.2 oz (62 kg)  10/09/22 132 lb (59.9 kg)    Physical Exam Vitals  reviewed.  Constitutional:      General: She is not in acute distress.    Appearance: Normal appearance.  HENT:     Head: Normocephalic and atraumatic.     Right Ear: External ear normal.     Left Ear: External ear normal.     Mouth/Throat:     Pharynx: No oropharyngeal exudate or posterior oropharyngeal erythema.  Eyes:     General: No scleral icterus.       Right  eye: No discharge.        Left eye: No discharge.     Conjunctiva/sclera: Conjunctivae normal.  Neck:     Thyroid : No thyromegaly.  Cardiovascular:     Rate and Rhythm: Normal rate and regular rhythm.  Pulmonary:     Effort: No respiratory distress.     Breath sounds: Normal breath sounds. No wheezing.  Abdominal:     General: Bowel sounds are normal.     Palpations: Abdomen is soft.     Tenderness: There is no abdominal tenderness.  Musculoskeletal:        General: No swelling or tenderness.     Cervical back: Neck supple. No tenderness.  Lymphadenopathy:     Cervical: No cervical adenopathy.  Skin:    Findings: No erythema or rash.  Neurological:     Mental Status: She is alert.  Psychiatric:        Mood and Affect: Mood normal.        Behavior: Behavior normal.      Outpatient Encounter Medications as of 01/22/2023  Medication Sig   albuterol  (VENTOLIN  HFA) 108 (90 Base) MCG/ACT inhaler Inhale 2 puffs into the lungs every 6 (six) hours as needed for wheezing or shortness of breath.   anastrozole  (ARIMIDEX ) 1 MG tablet Take 1 tablet (1 mg total) by mouth daily.   busPIRone  (BUSPAR ) 10 MG tablet Take 1 tablet (10 mg total) by mouth 3 (three) times daily.   citalopram  (CELEXA ) 40 MG tablet Take 1 tablet (40 mg total) by mouth daily.   clonazePAM  (KLONOPIN ) 0.5 MG tablet Take 1 tablet (0.5 mg total) by mouth 2 (two) times daily.   potassium chloride  (KLOR-CON ) 10 MEQ tablet Take 10 mEq by mouth daily.   pravastatin  (PRAVACHOL ) 20 MG tablet Take 1 tablet (20 mg total) by mouth daily.   QUEtiapine  (SEROQUEL )  50 MG tablet Take 1 tablet (50 mg total) by mouth at bedtime.   risperiDONE  (RISPERDAL ) 0.5 MG tablet Take 1 tablet (0.5 mg total) by mouth 2 (two) times daily at 8 am and 4 pm.   Tiotropium Bromide -Olodaterol (STIOLTO RESPIMAT ) 2.5-2.5 MCG/ACT AERS Inhale 2 puffs into the lungs daily.   traZODone  (DESYREL ) 50 MG tablet Take 25-50 mg by mouth at bedtime as needed for sleep.   triamterene -hydrochlorothiazide  (MAXZIDE -25) 37.5-25 MG tablet Take 1 tablet by mouth daily each morning for fluid retention.   No facility-administered encounter medications on file as of 01/22/2023.     Lab Results  Component Value Date   WBC 10.4 11/20/2022   HGB 12.4 11/20/2022   HCT 37.5 11/20/2022   PLT 201 11/20/2022   GLUCOSE 93 11/20/2022   CHOL 154 10/07/2022   TRIG 67.0 10/07/2022   HDL 77.00 10/07/2022   LDLDIRECT 148.4 11/03/2012   LDLCALC 64 10/07/2022   ALT 9 11/20/2022   AST 16 11/20/2022   NA 139 11/20/2022   K 3.6 11/20/2022   CL 100 11/20/2022   CREATININE 0.92 11/20/2022   BUN 16 11/20/2022   CO2 33 (H) 11/20/2022   TSH 0.798 07/15/2022   HGBA1C 5.4 10/07/2022    No results found.     Assessment & Plan:  Dyspnea, unspecified type -     EKG 12-Lead  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female, estrogen receptor positive [facility]) Assessment & Plan: Seeing oncology for f/u breast cancer - last seen 11/20/22. Transitioned from tamoxifen  to anastrozole .   Anxiety Assessment & Plan: Increased anxiety as outlined. Discussed  with her and her husband at length today. Physically doing well. She is seeing psychiatry regularly and she has done well with her current medication regimen. Discussed with psychiatry. EKG - obtained - QT - no increase. Discussed f/u and possible need to increase. She is comfortable with holding on making changes in medication today. Psychiatry working for earlier appt.  Follow.  Call if problems.       Allena Hamilton, MD

## 2023-01-23 ENCOUNTER — Encounter: Payer: Self-pay | Admitting: Internal Medicine

## 2023-01-23 NOTE — Assessment & Plan Note (Signed)
 Increased anxiety as outlined. Discussed with her and her husband at length today. Physically doing well. She is seeing psychiatry regularly and she has done well with her current medication regimen. Discussed with psychiatry. EKG - obtained - QT - no increase. Discussed f/u and possible need to increase. She is comfortable with holding on making changes in medication today. Psychiatry working for earlier appt.  Follow.  Call if problems.

## 2023-01-23 NOTE — Assessment & Plan Note (Signed)
 Seeing oncology for f/u breast cancer - last seen 11/20/22. Transitioned from tamoxifen to anastrozole.

## 2023-01-25 ENCOUNTER — Telehealth: Payer: Self-pay

## 2023-01-25 ENCOUNTER — Encounter: Payer: Self-pay | Admitting: Internal Medicine

## 2023-01-25 DIAGNOSIS — F419 Anxiety disorder, unspecified: Secondary | ICD-10-CM

## 2023-01-25 NOTE — Telephone Encounter (Signed)
 Called patient to let her know that no medications have been changed and she is on priority list for psychiatry

## 2023-01-25 NOTE — Telephone Encounter (Signed)
 Copied from CRM [id number]. Topic: Clinical - Prescription Issue >> Jan 25, 2023 10:39 AM Cherylynn B wrote: Reason for CRM: Patient states that her pharmacy asked if one of her medications has been changed. She was unaware of what medication they were referring to. Wants clarification on if a medicine was decreased or replaced. Callback [phone]

## 2023-01-26 ENCOUNTER — Telehealth: Payer: Self-pay | Admitting: Internal Medicine

## 2023-01-26 DIAGNOSIS — R739 Hyperglycemia, unspecified: Secondary | ICD-10-CM

## 2023-01-26 DIAGNOSIS — E538 Deficiency of other specified B group vitamins: Secondary | ICD-10-CM

## 2023-01-26 DIAGNOSIS — E7800 Pure hypercholesterolemia, unspecified: Secondary | ICD-10-CM

## 2023-01-26 NOTE — Telephone Encounter (Signed)
 Orders placed for labs

## 2023-01-26 NOTE — Telephone Encounter (Signed)
 Patient need lab orders (note states fasting labs)

## 2023-01-26 NOTE — Telephone Encounter (Signed)
 See daughter's message. I am ok with home health.  At her appt, I spoke with her husband and he had stated she was taking her medication. We may need him to take her pill box and distribute the medication to her - is another option.

## 2023-01-26 NOTE — Addendum Note (Signed)
 Addended by: Charm Barges on: 01/26/2023 10:09 PM   Modules accepted: Orders

## 2023-01-27 NOTE — Telephone Encounter (Signed)
 Are you ok with me putting in home health referral to see what she qualifies for with home health?

## 2023-01-27 NOTE — Telephone Encounter (Signed)
 Order placed for home health referral.  They will need to know we have placed the order - since will be coming to their home.

## 2023-01-27 NOTE — Telephone Encounter (Signed)
 Patient is aware

## 2023-01-28 ENCOUNTER — Ambulatory Visit: Admit: 2023-01-28 | Payer: Medicare Other | Admitting: Ophthalmology

## 2023-01-28 SURGERY — PHACOEMULSIFICATION, CATARACT, WITH IOL INSERTION
Anesthesia: Topical | Laterality: Right

## 2023-02-02 ENCOUNTER — Ambulatory Visit: Payer: Medicare Other | Admitting: Psychiatry

## 2023-02-02 ENCOUNTER — Encounter: Payer: Self-pay | Admitting: Psychiatry

## 2023-02-02 VITALS — BP 113/70 | HR 90 | Temp 97.5°F | Ht 61.5 in | Wt 142.8 lb

## 2023-02-02 DIAGNOSIS — F411 Generalized anxiety disorder: Secondary | ICD-10-CM

## 2023-02-02 DIAGNOSIS — F331 Major depressive disorder, recurrent, moderate: Secondary | ICD-10-CM

## 2023-02-02 DIAGNOSIS — Z91148 Patient's other noncompliance with medication regimen for other reason: Secondary | ICD-10-CM | POA: Diagnosis not present

## 2023-02-02 MED ORDER — LAMOTRIGINE 25 MG PO TABS: 25.0000 mg | ORAL_TABLET | Freq: Every day | ORAL | 1 refills | Status: DC

## 2023-02-02 MED ORDER — RISPERIDONE 0.5 MG PO TABS: 0.5000 mg | ORAL_TABLET | Freq: Every day | ORAL | 1 refills | Status: DC

## 2023-02-02 MED ORDER — CLONAZEPAM 0.5 MG PO TABS: 0.5000 mg | ORAL_TABLET | Freq: Two times a day (BID) | ORAL | 1 refills | Status: DC

## 2023-02-02 NOTE — Patient Instructions (Signed)
 Lamotrigine Tablets What is this medication? LAMOTRIGINE (la MOE Patrecia Pace) prevents and controls seizures in people with epilepsy. It may also be used to treat bipolar disorder. It works by calming overactive nerves in your body. This medicine may be used for other purposes; ask your health care provider or pharmacist if you have questions. COMMON BRAND NAME(S): Lamictal, Subvenite What should I tell my care team before I take this medication? They need to know if you have any of these conditions: Heart disease History of irregular heartbeat Immune system problems Kidney disease Liver disease Low levels of folic acid in the blood Lupus Mental health condition Suicidal thoughts, plans, or attempt by you or a family member An unusual or allergic reaction to lamotrigine, other medications, foods, dyes, or preservatives Pregnant or trying to get pregnant Breastfeeding How should I use this medication? Take this medication by mouth with a glass of water. Follow the directions on the prescription label. Do not chew these tablets. If this medication upsets your stomach, take it with food or milk. Take your doses at regular intervals. Do not take your medication more often than directed. A special MedGuide will be given to you by the pharmacist with each new prescription and refill. Be sure to read this information carefully each time. Talk to your care team about the use of this medication in children. While this medication may be prescribed for children as young as 2 years for selected conditions, precautions do apply. Overdosage: If you think you have taken too much of this medicine contact a poison control center or emergency room at once. NOTE: This medicine is only for you. Do not share this medicine with others. What if I miss a dose? If you miss a dose, take it as soon as you can. If it is almost time for your next dose, take only that dose. Do not take double or extra doses. What may  interact with this medication? Atazanavir Certain medications for irregular heartbeat Certain medications for seizures, such as carbamazepine, phenobarbital, phenytoin, primidone, or valproic acid Estrogen or progestin hormones Lopinavir Rifampin Ritonavir This list may not describe all possible interactions. Give your health care provider a list of all the medicines, herbs, non-prescription drugs, or dietary supplements you use. Also tell them if you smoke, drink alcohol, or use illegal drugs. Some items may interact with your medicine. What should I watch for while using this medication? Visit your care team for regular checks on your progress. If you take this medication for seizures, wear a Medic Alert bracelet or necklace. Carry an identification card with information about your condition, medications, and care team. It is important to take this medication exactly as directed. When first starting treatment, your dose will need to be adjusted slowly. It may take weeks or months before your dose is stable. You should contact your care team if your seizures get worse or if you have any new types of seizures. Do not stop taking this medication unless instructed by your care team. Stopping your medication suddenly can increase your seizures or their severity. This medication may cause serious skin reactions. They can happen weeks to months after starting the medication. Contact your care team right away if you notice fevers or flu-like symptoms with a rash. The rash may be red or purple and then turn into blisters or peeling of the skin. You may also notice a red rash with swelling of the face, lips, or lymph nodes in your neck or under your  arms. This medication may affect your coordination, reaction time, or judgment. Do not drive or operate machinery until you know how this medication affects you. Sit up or stand slowly to reduce the risk of dizzy or fainting spells. Drinking alcohol with this  medication can increase the risk of these side effects. If you are taking this medication for bipolar disorder, it is important to report any changes in your mood to your care team. If your condition gets worse, you get mentally depressed, feel very hyperactive or manic, have difficulty sleeping, or have thoughts of hurting yourself or committing suicide, you need to get help from your care team right away. If you are a caregiver for someone taking this medication for bipolar disorder, you should also report these behavioral changes right away. The use of this medication may increase the chance of suicidal thoughts or actions. Pay special attention to how you are responding while on this medication. Your mouth may get dry. Chewing sugarless gum or sucking hard candy and drinking plenty of water may help. Contact your care team if the problem does not go away or is severe. If you become pregnant while using this medication, you may enroll in the [place] American Antiepileptic Drug Pregnancy Registry by calling [phone]. This registry collects information about the safety of antiepileptic medication use during pregnancy. This medication may cause a decrease in folic acid. You should make sure that you get enough folic acid while you are taking this medication. Discuss the foods you eat and the vitamins you take with your care team. What side effects may I notice from receiving this medication? Side effects that you should report to your care team as soon as possible: Allergic reactions--skin rash, itching, hives, swelling of the face, lips, tongue, or throat Change in vision Fever, neck pain or stiffness, sensitivity to light, headache, nausea, vomiting, confusion Heart rhythm changes--fast or irregular heartbeat, dizziness, feeling faint or lightheaded, chest pain, trouble breathing Infection--fever, chills, cough, or sore throat Liver injury--right upper belly pain, loss of appetite, nausea,  light-colored stool, dark yellow or brown urine, yellowing skin or eyes, unusual weakness or fatigue Low red blood cell count--unusual weakness or fatigue, dizziness, headache, trouble breathing Rash, fever, and swollen lymph nodes Redness, blistering, peeling or loosening of the skin, including inside the mouth Thoughts of suicide or self-harm, worsening mood, or feelings of depression Unusual bruising or bleeding Side effects that usually do not require medical attention (report to your care team if they continue or are bothersome): Diarrhea Dizziness Drowsiness Headache Nausea Stomach pain Tremors or shaking This list may not describe all possible side effects. Call your doctor for medical advice about side effects. You may report side effects to FDA at 1-800-FDA-1088. Where should I keep my medication? Keep out of the reach of children and pets. Store at [employer] C (77 degrees F). Protect from light. Get rid of any unused medication after the expiration date. To get rid of medications that are no longer needed or have expired: Take the medication to a medication take-back program. Check with your pharmacy or law enforcement to find a location. If you cannot return the medication, check the label or package insert to see if the medication should be thrown out in the garbage or flushed down the toilet. If you are not sure, ask your care team. If it is safe to put it in the trash, empty the medication out of the container. Mix the medication with cat litter, dirt, coffee grounds,  or other unwanted substance. Seal the mixture in a bag or container. Put it in the trash. NOTE: This sheet is a summary. It may not cover all possible information. If you have questions about this medicine, talk to your doctor, pharmacist, or health care provider.  2024 Elsevier/Gold Standard (2021-07-15 00:00:00)

## 2023-02-02 NOTE — Progress Notes (Signed)
 [facility] MD OP Progress Note  02/02/2023 6:03 PM Jennifer [name]  MRN:  [id number]  Chief Complaint:  Chief Complaint  Patient presents with   Follow-up   Anxiety   Depression   Medication Refill   HPI: Jennifer [name] is a 78 year old Caucasian female, married, retired, lives in [place] has a history of GAD, MDD, COPD, history of malignant neoplasm of upper outer quadrant of right breast, estrogen positive, status post lumpectomy and radiation was evaluated in office today.  The patient reported increased nervousness in various situations, often leading to crying. The patient described a heightened sensitivity to situations not going as planned, leading to emotional distress. She also reported feeling more anxious in crowded places. The patient's anxiety symptoms have been affecting her daily life, causing her to avoid certain activities such as attending church due to fear of facing people and answering questions about her condition.  The patient's anxiety symptoms have been managed with multiple medications, including two antipsychotics, risperidone  and quetiapine . The patient reported taking her medications as prescribed and denied overuse. She also reported no issues with sleep or appetite, and she continues to enjoy activities such as watching TV and spending time with family. However, the patient expressed a desire to be more sociable and active outside the home.  The patient also reported occasional memory issues, such as forgetting the current date. She is currently taking Prevagen, an over-the-counter supplement, to help with memory. The patient's spouse confirmed these memory concerns and reported that the patient sometimes asks about the current day.  Patient today in session was able to correctly tell the date.  Appeared to be alert and oriented.  Answered questions appropriately.  The patient has a history of hospitalization in June 2024 to [facility] geropsychiatric unit. Since her discharge,  she has been managing her medications independently. The patient expressed a desire to improve her mental health and return to her normal activities, such as attending church. She acknowledged the need for therapy and expressed willingness to work with a therapist.   Patient currently denies any suicidality, homicidality or perceptual disturbances.   Visit Diagnosis:    ICD-10-CM   1. GAD (generalized anxiety disorder)  F41.1 risperiDONE  (RISPERDAL ) 0.5 MG tablet    clonazePAM  (KLONOPIN ) 0.5 MG tablet    2. MDD (major depressive disorder), recurrent episode, moderate (HCC)  F33.1 risperiDONE  (RISPERDAL ) 0.5 MG tablet    lamoTRIgine  (LAMICTAL ) 25 MG tablet    3. Overuse of medication  Z91.148    BZD      Past Psychiatric History: I have reviewed past psychiatric history from progress note on 04/08/2022.  Past trials of BuSpar , Celexa , lorazepam .  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 Lynwood Nasuti   Hypertension    PONV (postoperative nausea and vomiting)    Scoliosis    Ulcer     Past Surgical History:  Procedure Laterality Date   ABDOMINAL HYSTERECTOMY     ANTERIOR CERVICAL DECOMP/DISCECTOMY FUSION N/A 04/19/2017   Procedure: Anterior Cervical Decompression Fusion Cervical Three-Four, Cervical Four-Five, Cervical Five-Six;  Surgeon: Onetha Kuba, MD;  Location: [facility] OR;  Service: Neurosurgery;  Laterality: N/A;  Anterior Cervical Decompression Fusion Cervical Three-Four, Cervical Four-Five, Cervical Five-Six   BREAST BIOPSY Right 11/14/2020   US  Bx, Venus Clip, Invasive Mammary Carcinoma   BREAST LUMPECTOMY Right 2022   With radiation    BREAST LUMPECTOMY WITH RADIOACTIVE SEED AND SENTINEL LYMPH NODE BIOPSY Right 12/27/2020   Procedure: RIGHT BREAST LUMPECTOMY WITH RADIOACTIVE SEED AND AXILLARY SENTINEL LYMPH NODE BIOPSY;  Surgeon: Ebbie Cough, MD;  Location: [facility] OR;  Service: General;  Laterality: Right;   CHOLECYSTECTOMY  1996   DILATION AND CURETTAGE OF UTERUS  1971   LAPAROSCOPIC REMOVAL OF MESENTERIC MASS     LUMBAR DISC SURGERY  1998   [facility]   PARTIAL HYSTERECTOMY  1981   prolapsed uterus   TUBAL LIGATION  1978    Family Psychiatric History: I have reviewed family psychiatric history from progress note on 04/08/2022.  Family History:  Family History  Problem Relation Age of Onset   Stroke Mother    Cancer Father        unknown type   Cancer Brother        blood cancer   CVA Maternal Grandmother    Healthy Son    Breast cancer Neg Hx    Colon cancer Neg Hx    Mental illness Neg Hx     Social History: I have reviewed social history from progress note on 04/08/2022. Social History   Socioeconomic History   Marital status: Married    Spouse name: Ellender Burden   Number of children: 2   Years of education: Not on file   Highest education level: 12th grade  Occupational History   Occupation: retired runner, broadcasting/film/video  Tobacco Use   Smoking status: Never   Smokeless tobacco: Never  Vaping Use   Vaping status: Never Used  Substance and Sexual Activity   Alcohol use: No    Alcohol/week: 0.0 standard drinks of alcohol   Drug use: No   Sexual activity: Not Currently    Comment: not asked if sexually active  Other Topics Concern   Not on file  Social History Narrative   Right handed    Lives with husband   Had breast cancer surgery right side   Worked in the school system    Social Drivers of Health   Financial Resource Strain: Low Risk  (02/13/2022)   Overall Financial Resource Strain (CARDIA)    Difficulty of Paying Living Expenses: Not hard at all  Food Insecurity: No Food Insecurity (07/15/2022)    Hunger Vital Sign    Worried About Running Out of Food in the Last Year: Never true    Ran Out of Food in the Last Year: Never true  Transportation Needs: No Transportation Needs (07/15/2022)   PRAPARE - Administrator, Civil Service (Medical): No    Lack of Transportation (Non-Medical): No  Physical Activity: Unknown (07/14/2022)   Exercise Vital Sign    Days of Exercise per Week: 0 days    Minutes of Exercise per Session: Not on file  Stress: Stress Concern Present (07/14/2022)   [employer] of Occupational Health - Occupational Stress Questionnaire    Feeling of Stress : Very much  Social Connections: Moderately Integrated (07/14/2022)   Social Connection and Isolation Panel [NHANES]    Frequency of Communication with Friends and Family: More than three times a week    Frequency of Social Gatherings with Friends and Family: More  than three times a week    Attends Religious Services: 1 to 4 times per year    Active Member of Clubs or Organizations: No    Attends Banker Meetings: Not on file    Marital Status: Married    Allergies:  Allergies  Allergen Reactions   Advair Diskus [Fluticasone -Salmeterol] Other (See Comments)    Causes blisters in her mouth   Codeine     Unknown reaction   Pollen Extract Cough    Metabolic Disorder Labs: Lab Results  Component Value Date   HGBA1C 5.4 10/07/2022   No results found for: PROLACTIN Lab Results  Component Value Date   CHOL 154 10/07/2022   TRIG 67.0 10/07/2022   HDL 77.00 10/07/2022   CHOLHDL 2 10/07/2022   VLDL 13.4 10/07/2022   LDLCALC 64 10/07/2022   LDLCALC 61 05/13/2022   Lab Results  Component Value Date   TSH 0.798 07/15/2022   TSH 0.93 12/16/2021    Therapeutic Level Labs: No results found for: LITHIUM No results found for: VALPROATE No results found for: CBMZ  Current Medications: Current Outpatient Medications  Medication Sig Dispense Refill   albuterol  (VENTOLIN   HFA) 108 (90 Base) MCG/ACT inhaler Inhale 2 puffs into the lungs every 6 (six) hours as needed for wheezing or shortness of breath. 8 g 2   anastrozole  (ARIMIDEX ) 1 MG tablet Take 1 tablet (1 mg total) by mouth daily. 30 tablet 11   busPIRone  (BUSPAR ) 10 MG tablet Take 1 tablet (10 mg total) by mouth 3 (three) times daily. 90 tablet 1   citalopram  (CELEXA ) 40 MG tablet Take 1 tablet (40 mg total) by mouth daily. 90 tablet 1   lamoTRIgine  (LAMICTAL ) 25 MG tablet Take 1 tablet (25 mg total) by mouth daily after lunch. 30 tablet 1   potassium chloride  (KLOR-CON ) 10 MEQ tablet Take 10 mEq by mouth daily.     pravastatin  (PRAVACHOL ) 20 MG tablet Take 1 tablet (20 mg total) by mouth daily. 30 tablet 5   QUEtiapine  (SEROQUEL ) 50 MG tablet Take 1 tablet (50 mg total) by mouth at bedtime. 30 tablet 3   Tiotropium Bromide -Olodaterol (STIOLTO RESPIMAT ) 2.5-2.5 MCG/ACT AERS Inhale 2 puffs into the lungs daily. 4 g 11   traZODone  (DESYREL ) 50 MG tablet Take 25-50 mg by mouth at bedtime as needed for sleep.     triamterene -hydrochlorothiazide  (MAXZIDE -25) 37.5-25 MG tablet Take 1 tablet by mouth daily each morning for fluid retention. 90 tablet 1   clonazePAM  (KLONOPIN ) 0.5 MG tablet Take 1 tablet (0.5 mg total) by mouth 2 (two) times daily. 60 tablet 1   risperiDONE  (RISPERDAL ) 0.5 MG tablet Take 1 tablet (0.5 mg total) by mouth daily. 30 tablet 1   No current facility-administered medications for this visit.     Musculoskeletal: Strength & Muscle Tone: within normal limits Gait & Station: normal Patient leans: N/A  Psychiatric Specialty Exam: Review of Systems  Psychiatric/Behavioral:  Positive for decreased concentration and dysphoric mood. The patient is nervous/anxious.     Blood pressure 113/70, pulse 90, temperature (!) 97.5 F (36.4 C), temperature source Skin, height 5' 1.5 (1.562 m), weight 142 lb 12.8 oz (64.8 kg).Body mass index is 26.54 kg/m.  General Appearance: Casual  Eye Contact:   Fair  Speech:  Clear and Coherent  Volume:  Normal  Mood:  Anxious and Depressed  Affect:  Appropriate  Thought Process:  Goal Directed and Descriptions of Associations: Intact  Orientation:  Full (Time, Place, and Person)  Thought Content: Logical   Suicidal Thoughts:  No  Homicidal Thoughts:  No  Memory:  Immediate;   Fair Recent;   Fair Remote;   Fair  Judgement:  Fair  Insight:  Fair  Psychomotor Activity:  Normal  Concentration:  Concentration: Fair and Attention Span: Fair  Recall:  [employer] of Knowledge: Fair  Language: Fair  Akathisia:  No  Handed:  Right  AIMS (if indicated): done  Assets:  Desire for Improvement Housing Intimacy Social Support  ADL's:  Intact  Cognition: WNL  Sleep:  Fair   Screenings: Midwife Visit from 02/02/2023 in [facility] Office Visit from 09/09/2022 in [facility]  AIMS Total Score 0 0      AUDIT    Flowsheet Row Admission (Discharged) from 07/15/2022 in [facility]  Alcohol Use Disorder Identification Test Final Score (AUDIT) 0      GAD-7    Flowsheet Row Office Visit from 09/09/2022 in [facility] Office Visit from 06/26/2022 in [facility] Office Visit from 06/10/2022 in [facility] Office Visit from 05/14/2022 in [facility] Office Visit from 04/08/2022 in [facility]  Total GAD-7 Score 4 21 21 13 2       Mini-Mental    Flowsheet Row Office Visit from 06/26/2022 in [facility] Clinical Support from 06/23/2016 in [facility]  Total Score (max 30 points ) 26 30      PHQ2-9    Flowsheet Row Office Visit from 09/09/2022 in [facility] Office Visit from 06/26/2022 in [facility] Office Visit from 06/10/2022 in [facility] Office Visit from 05/14/2022 in [facility] Office Visit from 04/08/2022 in [facility]  PHQ-2 Total Score 2 6 6 2 1   PHQ-9 Total Score 6 25 23 11 3       Flowsheet Row Office Visit from 02/02/2023 in [facility] Office Visit from 09/09/2022 in [facility] Admission (Discharged) from 07/15/2022 in [facility] MEDICINE  C-SSRS RISK CATEGORY No Risk No Risk No Risk        Assessment and Plan: ALYSA DUCA is a 78 year old Caucasian female, married, retired, lives in [place], has a history of depression, anxiety, breast cancer in remission, was evaluated in office today for worsening depression and anxiety symptoms, discussed assessment and plan as noted below.  Generalized Anxiety Disorder - unstable MDD recurrent moderate - unstable Reports worsening anxiety with increased nervousness, frequent crying, and difficulty handling situations. Symptoms exacerbated by social interactions and crowds. No current therapist. No sleep or appetite issues. Currently on risperidone  and quetiapine . Discussed adding lamotrigine  as a mood stabilizer, with risks including rash , heart palpitations, dizziness, dry mouth, nausea, and GI issues. Discussed reducing risperidone  to once daily due to long-term side effects, including sudden death and cardiac arrest. Encouraged gradual reintroduction to social activities. - Add lamotrigine  25 mg daily as a mood stabilizer, starting with a low dose at lunchtime - Reduce risperidone  0.5 mg to once daily (8 AM dose) - Continue Seroquel  50 mg at bedtime. - Continue BuSpar  10 mg 3 times daily -  Continue  trazodone  25-50 mg at bedtime as needed. - Continue clonazepam  0.5 mg twice a day - Reviewed [place]MP AWARxE - Refer to in-house therapist for counseling - Monitor for side effects of lamotrigine  - Encourage gradual reintroduction to social activities - Schedule follow-up in February for evaluation and potential medication adjustment  Patient to monitor memory changes, will consider referral to neurology as needed.  Will consider getting MMSE/SLUMS in future sessions.  Collateral information was obtained from spouse who was present in session.  Spouse reports worsening mood symptoms and agrees with plan.   Follow-up - Schedule follow-up in February for evaluation and potential medication adjustment - Monitor for any changes in symptoms or side effects and report immediately if they occur.   Collaboration of Care: Collaboration of Care: Referral or follow-up with counselor/therapist AEB patient encouraged to establish care with therapist.  I have referred to our in-house therapist.  Patient/Guardian was advised Release of Information must be obtained prior to any record release in order to collaborate their care with an outside provider. Patient/Guardian was advised if they have not already done so to contact the registration department to sign all necessary forms in order for us  to release information regarding their care.   Consent: Patient/Guardian gives verbal consent for treatment and assignment of benefits for services provided during this visit. Patient/Guardian expressed understanding and agreed to proceed.   This note was generated in part or whole with voice recognition software. Voice recognition is usually quite accurate but there are transcription errors that can and very often do occur. I apologize for any typographical errors that were not detected and corrected.    Young Brim, MD 02/03/2023, 1:06 PM

## 2023-02-03 ENCOUNTER — Other Ambulatory Visit: Payer: Self-pay | Admitting: Internal Medicine

## 2023-02-08 ENCOUNTER — Other Ambulatory Visit (INDEPENDENT_AMBULATORY_CARE_PROVIDER_SITE_OTHER): Payer: Medicare Other

## 2023-02-08 DIAGNOSIS — E538 Deficiency of other specified B group vitamins: Secondary | ICD-10-CM

## 2023-02-08 DIAGNOSIS — R739 Hyperglycemia, unspecified: Secondary | ICD-10-CM

## 2023-02-08 DIAGNOSIS — E7800 Pure hypercholesterolemia, unspecified: Secondary | ICD-10-CM

## 2023-02-08 LAB — HEPATIC FUNCTION PANEL
ALT: 11 U/L (ref 0–35)
AST: 19 U/L (ref 0–37)
Albumin: 4.3 g/dL (ref 3.5–5.2)
Alkaline Phosphatase: 55 U/L (ref 39–117)
Bilirubin, Direct: 0.1 mg/dL (ref 0.0–0.3)
Total Bilirubin: 0.6 mg/dL (ref 0.2–1.2)
Total Protein: 6.8 g/dL (ref 6.0–8.3)

## 2023-02-08 LAB — BASIC METABOLIC PANEL
BUN: 13 mg/dL (ref 6–23)
CO2: 34 meq/L — ABNORMAL HIGH (ref 19–32)
Calcium: 9.4 mg/dL (ref 8.4–10.5)
Chloride: 98 meq/L (ref 96–112)
Creatinine, Ser: 0.83 mg/dL (ref 0.40–1.20)
GFR: 68.66 mL/min (ref >60.00)
Glucose, Bld: 90 mg/dL (ref 70–99)
Potassium: 3.7 meq/L (ref 3.5–5.1)
Sodium: 140 meq/L (ref 135–145)

## 2023-02-08 LAB — LIPID PANEL
Cholesterol: 180 mg/dL (ref 0–200)
HDL: 79.3 mg/dL (ref >39.00)
LDL Cholesterol: 86 mg/dL (ref 0–99)
NonHDL: 101.08
Total CHOL/HDL Ratio: 2
Triglycerides: 73 mg/dL (ref 0.0–149.0)
VLDL: 14.6 mg/dL (ref 0.0–40.0)

## 2023-02-08 LAB — VITAMIN B12: Vitamin B-12: 253 pg/mL (ref 211–911)

## 2023-02-08 LAB — HEMOGLOBIN A1C: Hgb A1c MFr Bld: 5.7 % (ref 4.6–6.5)

## 2023-02-08 LAB — TSH: TSH: 1.21 u[IU]/mL (ref 0.35–5.50)

## 2023-02-10 ENCOUNTER — Encounter: Payer: Medicare Other | Admitting: Internal Medicine

## 2023-02-10 NOTE — Assessment & Plan Note (Deleted)
Physical today 02/10/23  Colonoscopy 2018  Mammogram 12/24/21 - Birads II. Recommended f/u bilateral diagnostic mammogram with possible ultrasounds in one year.

## 2023-02-10 NOTE — Progress Notes (Deleted)
Subjective:    [name], female    DOB: 1945/05/18, 78 y.o.   MRN: [id number]  Patient here for No chief complaint on file.   HPI Here for a physical exam. Has been having increased anxiety issues recently. Saw Dr Elna Breslow 02/02/23. Lamictal added. Risperidone doe reduced to once daily. Continues seroquel, buspar, trazodone and clonazepam. Also recommended scheduling with therapist.     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 Antony Blackbird   Hypertension    PONV (postoperative nausea and vomiting)    Scoliosis    Ulcer    Past Surgical History:  Procedure Laterality Date   ABDOMINAL HYSTERECTOMY     ANTERIOR CERVICAL DECOMP/DISCECTOMY FUSION N/A 04/19/2017   Procedure: Anterior Cervical Decompression Fusion Cervical Three-Four, Cervical Four-Five, Cervical Five-Six;  Surgeon: Donalee Citrin, MD;  Location: [facility] OR;  Service: Neurosurgery;  Laterality: N/A;  Anterior Cervical Decompression Fusion Cervical Three-Four, Cervical Four-Five, Cervical Five-Six   BREAST BIOPSY Right 11/14/2020   [place] Bx, Venus Clip, Invasive Mammary Carcinoma   BREAST LUMPECTOMY Right 2022   With radiation   BREAST LUMPECTOMY WITH RADIOACTIVE SEED AND SENTINEL LYMPH NODE BIOPSY Right 12/27/2020   Procedure: RIGHT BREAST LUMPECTOMY WITH RADIOACTIVE SEED AND AXILLARY SENTINEL LYMPH NODE BIOPSY;  Surgeon: Emelia Loron, MD;  Location: [facility] OR;  Service: General;  Laterality: Right;   CHOLECYSTECTOMY  1996   DILATION AND CURETTAGE OF UTERUS  1971   LAPAROSCOPIC REMOVAL OF MESENTERIC MASS     LUMBAR DISC SURGERY  1998   [facility]   PARTIAL HYSTERECTOMY  1981   prolapsed uterus   TUBAL  LIGATION  1978   Family History  Problem Relation Age of Onset   Stroke Mother    Cancer Father        unknown type   Cancer Brother        "blood cancer"   CVA Maternal Grandmother    Healthy Son    Breast cancer Neg Hx    Colon cancer Neg Hx    Mental illness Neg Hx    Social History   Socioeconomic History   Marital status: Married    Spouse name: Mariana Kaufman "[name]"   Number of children: 2   Years of education: Not on file   Highest education level: 12th grade  Occupational History   Occupation: retired Runner, broadcasting/film/video  Tobacco Use   Smoking status: Never   Smokeless tobacco: Never  Vaping Use   Vaping status: Never Used  Substance and Sexual Activity   Alcohol use: No    Alcohol/week: 0.0 standard drinks of alcohol   Drug use: No   Sexual activity: Not Currently    Comment: not asked if sexually active  Other Topics Concern   Not on file  Social History Narrative   Right handed    Lives with husband   Had breast cancer surgery right side   Worked in the school system    Social Drivers of Health   Financial Resource Strain: Low Risk  (02/13/2022)   Overall Financial Resource Strain (CARDIA)  Difficulty of Paying Living Expenses: Not hard at all  Food Insecurity: No Food Insecurity (07/15/2022)   Hunger Vital Sign    Worried About Running Out of Food in the Last Year: Never true    Ran Out of Food in the Last Year: Never true  Transportation Needs: No Transportation Needs (07/15/2022)   PRAPARE - Administrator, Civil Service (Medical): No    Lack of Transportation (Non-Medical): No  Physical Activity: Unknown (07/14/2022)   Exercise Vital Sign    Days of Exercise per Week: 0 days    Minutes of Exercise per Session: Not on file  Stress: Stress Concern Present (07/14/2022)   [employer] of Occupational Health - Occupational Stress Questionnaire    Feeling of Stress : Very much  Social Connections: Moderately Integrated (07/14/2022)   Social Connection  and Isolation Panel [NHANES]    Frequency of Communication with Friends and Family: More than three times a week    Frequency of Social Gatherings with Friends and Family: More than three times a week    Attends Religious Services: 1 to 4 times per year    Active Member of [employer] or Organizations: No    Attends Engineer, structural: Not on file    Marital Status: Married     Review of Systems     Objective:     There were no vitals taken for this visit. Wt Readings from Last 3 Encounters:  01/22/23 140 lb 9.6 oz (63.8 kg)  11/20/22 136 lb 11.2 oz (62 kg)  10/09/22 132 lb (59.9 kg)    Physical Exam  {Perform Simple Foot Exam  Perform Detailed exam:1} {Insert foot Exam (Optional):30965}   Outpatient Encounter Medications as of 02/10/2023  Medication Sig   albuterol (VENTOLIN HFA) 108 (90 Base) MCG/ACT inhaler Inhale 2 puffs into the lungs every 6 (six) hours as needed for wheezing or shortness of breath.   anastrozole (ARIMIDEX) 1 MG tablet Take 1 tablet (1 mg total) by mouth daily.   busPIRone (BUSPAR) 10 MG tablet Take 1 tablet (10 mg total) by mouth 3 (three) times daily.   citalopram (CELEXA) 40 MG tablet Take 1 tablet (40 mg total) by mouth daily.   clonazePAM (KLONOPIN) 0.5 MG tablet Take 1 tablet (0.5 mg total) by mouth 2 (two) times daily.   lamoTRIgine (LAMICTAL) 25 MG tablet Take 1 tablet (25 mg total) by mouth daily after lunch.   potassium chloride (KLOR-CON) 10 MEQ tablet Take one tablet by mouth once daily.   pravastatin (PRAVACHOL) 20 MG tablet Take 1 tablet (20 mg total) by mouth daily.   QUEtiapine (SEROQUEL) 50 MG tablet Take 1 tablet (50 mg total) by mouth at bedtime.   risperiDONE (RISPERDAL) 0.5 MG tablet Take 1 tablet (0.5 mg total) by mouth daily.   Tiotropium Bromide-Olodaterol (STIOLTO RESPIMAT) 2.5-2.5 MCG/ACT AERS Inhale 2 puffs into the lungs daily.   traZODone (DESYREL) 50 MG tablet Take 25-50 mg by mouth at bedtime as needed for sleep.    triamterene-hydrochlorothiazide (MAXZIDE-25) 37.5-25 MG tablet Take 1 tablet by mouth daily each morning for fluid retention.   No facility-administered encounter medications on file as of 02/10/2023.     Lab Results  Component Value Date   WBC 10.4 11/20/2022   HGB 12.4 11/20/2022   HCT 37.5 11/20/2022   PLT 201 11/20/2022   GLUCOSE 90 02/08/2023   CHOL 180 02/08/2023   TRIG 73.0 02/08/2023   HDL 79.30 02/08/2023   LDLDIRECT 148.4  11/03/2012   LDLCALC 86 02/08/2023   ALT 11 02/08/2023   AST 19 02/08/2023   NA 140 02/08/2023   K 3.7 02/08/2023   CL 98 02/08/2023   CREATININE 0.83 02/08/2023   BUN 13 02/08/2023   CO2 34 (H) 02/08/2023   TSH 1.21 02/08/2023   HGBA1C 5.7 02/08/2023    No results found.     Assessment & Plan:  There are no diagnoses linked to this encounter.   Dale [place], MD

## 2023-02-11 ENCOUNTER — Ambulatory Visit: Payer: Medicare Other | Admitting: Professional Counselor

## 2023-02-11 DIAGNOSIS — F331 Major depressive disorder, recurrent, moderate: Secondary | ICD-10-CM | POA: Diagnosis not present

## 2023-02-11 DIAGNOSIS — F411 Generalized anxiety disorder: Secondary | ICD-10-CM | POA: Diagnosis not present

## 2023-02-11 NOTE — Progress Notes (Signed)
Comprehensive Clinical Assessment (CCA) Note  02/11/2023 Jennifer [name] [id number]  Chief Complaint:  Chief Complaint  Patient presents with   Establish Care    "I just get down on myself a lot. I get down on myself and I want to be right."   Visit Diagnosis: GAD, MDD    CCA Screening, Triage and Referral (STR)  Patient Reported Information How did you hear about [place]? Other (Comment)  Referral name: Dr. Elna Breslow  Whom do you see for routine medical problems? Primary Care  Practice/Facility Name: [facility]  Name of Contact: Dr. Lorin Picket  What Is the Reason for Your Visit/Call Today? Establish therapy  How Long Has This Been Causing You Problems? 1-6 months  What Do You Feel Would Help You the Most Today? Treatment for Depression or other mood problem  Have You Recently Been in Any Inpatient Treatment (Hospital/Detox/Crisis Center/28-Day Program)? Yes  Name/Location of Program/Hospital:[facility]  How Long Were You There? A week  Have You Ever Received Services From [facility] Before? Yes  Who Do You See at [facility]? Dr. Elna Breslow  Have You Recently Had Any Thoughts About Hurting Yourself? No  Are You Planning to Commit Suicide/Harm Yourself At This time? No  Have you Recently Had Thoughts About Hurting Someone [name] Ohs? No  Have You Used Any Alcohol or Drugs in the Past 24 Hours? No  How Long Ago Did You Use Drugs or Alcohol? N/A  What Did You Use and How Much? N/A  Do You Currently Have a Therapist/Psychiatrist? Yes  Name of Therapist/Psychiatrist: Dr. Elna Breslow  Have You Been Recently Discharged From Any Office Practice or Programs? No    CCA Screening Triage Referral Assessment Type of Contact: Face-to-Face  Is this Initial or Reassessment? Initial  Collateral Involvement: Husband  Does Patient Have a Automotive engineer Guardian? No  Is CPS involved or ever been involved? Never  Is APS involved or ever been involved? Never  Patient Determined To Be At Risk  for Harm To Self or Others Based on Review of Patient Reported Information or Presenting Complaint? No  Method: -- (N/A, no HI)  Availability of Means: -- (N/A, no HI)  Intent: -- (N/A, no HI)  Notification Required: -- (N/A, no HI)  Additional Information for Danger to Others Potential: -- (N/A, no HI)  Additional Comments for Danger to Others Potential: N/A, no HI  Are There Guns or Other Weapons in Your Home? Yes  Are These Weapons Safely Secured?  Yes  Who Could Verify You Are Able To Have These Secured: Husband  Do You Have any Outstanding Charges, Pending Court Dates, Parole/Probation? No  Contacted To Inform of Risk of Harm To Self or Others: -- (N/A)  Location of Assessment: [facility]  Does Patient Present under Involuntary Commitment? No  County of Residence: [place]  Patient Currently Receiving the Following Services: Medication Management  Determination of Need: Routine (7 days)  Options For Referral: Outpatient Therapy   CCA Biopsychosocial Intake/Chief Complaint:  Anxiety and depression symptoms  Current Symptoms/Problems: Worry, adhedonia, concentration/memory issues  Patient Reported Schizophrenia/Schizoaffective Diagnosis in Past: No  Strengths: "Children, adults, I can talk to adults. I like people. I like being around people."  Preferences: None "I don't want to go in a group."  Abilities: "I used to love to cook. I used to crochet scarfs."  Type of Services Patient Feels are Needed: "Just talking with someone."  Initial Clinical Notes/Concerns: No data recorded  Mental Health Symptoms Depression:  Change in energy/activity; Fatigue;  Hopelessness   Duration of Depressive symptoms: Greater than two weeks   Mania:  None   Anxiety:   Worrying; Difficulty concentrating   Psychosis:  None   Duration of Psychotic symptoms: No data recorded  Trauma:  None   Obsessions:  None   Compulsions:  None   Inattention:  None    Hyperactivity/Impulsivity:  None   Oppositional/Defiant Behaviors:  None   Emotional Irregularity:  None   Other Mood/Personality Symptoms:  Worsening anxiety for past 2 mos    Mental Status Exam Appearance and self-care  Stature:  Small   Weight:  Average weight   Clothing:  Neat/clean   Grooming:  Normal   Cosmetic use:  None   Posture/gait:  Normal   Motor activity:  Not Remarkable   Sensorium  Attention:  Normal   Concentration:  Normal   Orientation:  X5   Recall/memory:  Defective in Short-term   Affect and Mood  Affect:  Anxious   Mood:  Anxious   Relating  Eye contact:  Normal   Facial expression:  Responsive   Attitude toward examiner:  Cooperative   Thought and Language  Speech flow: Clear and Coherent   Thought content:  Appropriate to Mood and Circumstances   Preoccupation:  None   Hallucinations:  None   Organization:  No data recorded  [employer] of Knowledge:  Good   Intelligence:  Average   Abstraction:  Normal   Judgement:  Good   Reality Testing:  Realistic   Insight:  Good   Decision Making:  Normal   Social Functioning  Social Maturity:  Responsible   Social Judgement:  Normal   Stress  Stressors:  Illness (History of breast cancer, in remission. Memory issues)   Coping Ability:  Overwhelmed   Skill Deficits:  None   Supports:  Family; Friends/Service system; Church ("My husband, my daughter, my son, my friends, my grandkids.")       02/11/2023   10:04 AM 09/09/2022    1:41 PM 06/26/2022    1:20 PM  Depression screen PHQ 2/9  Decreased Interest 1 1 3   Down, Depressed, Hopeless 0 1 3  PHQ - 2 Score 1 2 6   Altered sleeping 0 0 3  Tired, decreased energy 0 1 3  Change in appetite 0 1 3  Feeling bad or failure about yourself  0 0 3  Trouble concentrating 1 1 3   Moving slowly or fidgety/restless 0 1 3  Suicidal thoughts 0 0 1  PHQ-9 Score 2 6 25   Difficult doing work/chores Not difficult  at all Somewhat difficult Very difficult      02/11/2023   10:03 AM 09/09/2022    1:42 PM 06/26/2022    1:21 PM 06/10/2022    4:10 PM  GAD 7 : Generalized Anxiety Score  Nervous, Anxious, on Edge 1 1 3 3   Control/stop worrying 3 1 3 3   Worry too much - different things 0 1 3 3   Trouble relaxing 3 0 3 3  Restless 1 0 3 3  Easily annoyed or irritable 0 1 3 3   Afraid - awful might happen 0 0 3 3  Total GAD 7 Score 8 4 21 21   Anxiety Difficulty Not difficult at all Not difficult at all Very difficult Somewhat difficult   Religion: Religion/Spirituality Are You A Religious Person?: Yes What is Your Religious Affiliation?: Non-Denominational  Leisure/Recreation: Leisure / Recreation Do You Have Hobbies?: Yes Leisure and Hobbies: "I used  to crochet. 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?: No  Education: Education Is Patient Currently Attending School?: No Did Garment/textile technologist From [employer]?: Yes Did Theme park manager?: No Did Designer, television/film set?: No Did You Have An Individualized Education Program (IIEP): No Did You Have Any Difficulty At [employer]?: No Patient's Education Has Been Impacted by Current Illness: No   CCA Family/Childhood History Family and Relationship History: Family history Marital status: Married Number of Years Married: 56 Are you sexually active?: Yes Has your sexual activity been affected by drugs, alcohol, medication, or emotional stress?: "Just  emotional stress." Does patient have children?: Yes How many children?: 2 How is patient's relationship with their children?: 1 son and 1 daughter (adopted), 3 grandchildren. Close with children and grandchildren  Childhood History:  Childhood History By whom was/is the patient raised?: Mother/father and step-parent Additional childhood history information: Raised with mom and stepfather "Good." Description of patient's relationship with caregiver when they were a child: Mother - "Randie Heinz." Stepdad - "Great." Patient's description of current relationship with people who raised him/her: Remained close in adulthood. Does patient have siblings?: Yes Number of Siblings: 1 Description of patient's current relationship with siblings: 1 older brother - "[place]." Did patient suffer any verbal/emotional/physical/sexual abuse as a child?: No Did patient suffer from severe childhood neglect?: No Has patient ever been sexually abused/assaulted/raped as an adolescent or adult?: No Was the patient ever a victim of a crime or a disaster?: No Witnessed domestic violence?: No Has patient been affected by domestic violence as an adult?: No   CCA Substance Use Alcohol/Drug Use: Alcohol / Drug Use Pain Medications: See MAR Prescriptions: See MAR Over the Counter: See MAR History of alcohol / drug use?: No history of alcohol / drug abuse  ASAM's:  Six Dimensions of Multidimensional Assessment  Dimension 1:  Acute Intoxication and/or Withdrawal Potential:      Dimension 2:  Biomedical Conditions and Complications:      Dimension 3:  Emotional, Behavioral, or Cognitive Conditions and Complications:     Dimension 4:  Readiness to Change:     Dimension 5:  Relapse, Continued use, or Continued Problem Potential:     Dimension 6:  Recovery/Living Environment:     ASAM Severity Score:    ASAM Recommended Level of Treatment:     Substance use Disorder (SUD) N/A   Recommendations for  Services/Supports/Treatments: N/A   DSM5 Diagnoses: Patient Active Problem List   Diagnosis Date Noted   High risk medication use 09/09/2022   Overuse of medication 06/10/2022   GAD (generalized anxiety disorder) 04/08/2022   Depression 04/08/2022   Anxiety 11/02/2021   Decreased appetite 10/07/2021   Osteopenia 08/22/2021   Ear pain, right 06/13/2021   Tremor 06/07/2021   Hypokalemia 06/07/2021   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female, 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   Nasal congestion 02/04/2020   Sore throat 01/25/2020   Neck pain 08/19/2019   B12 deficiency 08/19/2019   Knee pain 02/04/2019  Memory change 02/04/2019   Radiculopathy of cervical spine 04/20/2017   Spondylosis of cervical spine 04/19/2017   Ear pain, left 03/27/2017   Right arm pain 11/12/2016   Vertigo 09/02/2016   Left low back pain 05/22/2015   Left hip pain 05/22/2015   SOB (shortness of breath) 11/11/2014   Right shoulder pain 07/16/2014   Health care maintenance 06/03/2014   Arm skin lesion, left 06/18/2013   URI (upper respiratory infection) 03/03/2013   Hyperglycemia 11/12/2012   Hypercholesterolemia 11/12/2012   Essential hypertension, benign 05/12/2012   GERD (gastroesophageal reflux disease) 05/12/2012   Gastritis 05/12/2012   History of colonic polyps 05/12/2012   Asthma 12/13/2011   Chronic restrictive lung disease 05/11/2011   Referrals to Alternative Service(s): Referred to Alternative Service(s):   Place:   Date:   Time:    Referred to Alternative Service(s):   Place:   Date:   Time:    Referred to Alternative Service(s):   Place:   Date:   Time:    Referred to Alternative Service(s):   Place:   Date:   Time:      Collaboration of Care: Medication Management AEB chart review  Summary: Jennifer [name] is a married 77 y.o. Caucasian female. She presents to [facility] to establish outpatient therapy services. She is already engaged in medication management  with Dr. Elna Breslow, first assessed on April 08, 2022. She was previously hospitalized in June/July 2024 due to mental health concerns. These records were reviewed prior to this assessment. Jennifer [name] was assessed conjointly with her husband and individually. She reports the following concerns: "I just get down on myself a lot. I get down on myself and I want to be right." She reports constant worrying and trouble relaxing.   Jennifer [name] appears alert and oriented x5. She is neatly dressed and appropriately groomed. Her mood is anxious but she is responsive and cooperative during assessment. Her speech is soft in tone/volume; thought content/process is logical and linear. She denies current or history of SI/HI/AVH. She denies issues with sleep or appetite. She does take melatonin to help with sleep. Jennifer [name] notes issues with her memory and husband confirms she will repeat herself with things she told him ten minutes prior. Jennifer [name] scored mild on anxiety screening and minimal on depression screening today. Jennifer [name] feels as though her issues began after being diagnosed with breast cancer but have gotten worse within the last 6 months. She has not engaged in therapy in the past. She feels medications are currently working well for her.  Jennifer [name] reports she was raised by her mother and stepfather. She did not have a relationship with her biological father. She describes her childhood as "good" and relationship with mother/stepfather as "great." She has one older brother and states their relationship is "great. Siannah has been married for 56 years and denies any issues within the marriage. They adopted their daughter and had a biological son 1-2 years later. They have three grandchildren. Kissa reports the relationship with her children/grandchildren is close. They talk daily and visit weekly.   Mazel completed high school. 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 Her longest employment at one location was 26  years, at [employer] school. She reports she enjoyed working with children and her husband notes the impact she had on many children's lives. Ryliee and her husband attend [employer] regularly although she reports they are not going as often as they used to due to her anxiety. She also  used to enjoy crocheting, cooking, and puzzles, but hasn't been doing those activities as much either.   Lamyia meets criteria for the following: F41.1 Generalized anxiety disorder AEB excessive anxiety or worry occurring more days than not for at least 6 months; restlessness, fatigue, and difficulty concentrating which causes significant distress or impairment in social, occupational, or other important areas of functioning. F33.1 Major depressive disorder, recurrent, moderate AEB depressed mood most of the day, nearly every day; feelings of hopelessness, worthlessness, fatigue; and diminished ability to think/concentrate.  Recommendations: Mukta is recommended to continue with medication management and engage in outpatient therapy. She is in agreement with these recommendations. Plan for ongoing therapy was discussed along with confidentiality limitations and no-show policy.  Patient/Guardian was advised Release of Information must be obtained prior to any record release in order to collaborate their care with an outside provider. Patient/Guardian was advised if they have not already done so to contact the registration department to sign all necessary forms in order for [place] to release information regarding their care.   Consent: Patient/Guardian gives verbal consent for treatment and assignment of benefits for services provided during this visit. Patient/Guardian expressed understanding and agreed to proceed.   Edmonia Lynch, [facility]

## 2023-02-14 DIAGNOSIS — J449 Chronic obstructive pulmonary disease, unspecified: Secondary | ICD-10-CM | POA: Diagnosis not present

## 2023-02-15 ENCOUNTER — Other Ambulatory Visit: Payer: Self-pay | Admitting: Psychiatry

## 2023-02-15 DIAGNOSIS — F411 Generalized anxiety disorder: Secondary | ICD-10-CM

## 2023-02-15 DIAGNOSIS — F321 Major depressive disorder, single episode, moderate: Secondary | ICD-10-CM

## 2023-02-16 ENCOUNTER — Ambulatory Visit (INDEPENDENT_AMBULATORY_CARE_PROVIDER_SITE_OTHER): Payer: Medicare Other | Admitting: *Deleted

## 2023-02-16 VITALS — Ht 61.5 in | Wt 143.0 lb

## 2023-02-16 DIAGNOSIS — Z0000 Encounter for general adult medical examination without abnormal findings: Secondary | ICD-10-CM

## 2023-02-16 NOTE — Patient Instructions (Signed)
Jennifer [name] , Thank you for taking time to come for your Medicare Wellness Visit. I appreciate your ongoing commitment to your health goals. Please review the following plan we discussed and let me know if I can assist you in the future.   Referrals/Orders/Follow-Ups/Clinician Recommendations: Remember to get your Tetanus vaccine and call to schedule your Mammogram as we discussed.  This is a list of the screening recommended for you and due dates:  Health Maintenance  Topic Date Due   DTaP/Tdap/Td vaccine (1 - Tdap) Never done   Colon Cancer Screening  02/25/2017   COVID-19 Vaccine (6 - 2024-25 season) 12/08/2022   Mammogram  12/25/2022   Pneumonia Vaccine (2 of 2 - PPSV23 or PCV20) 07/15/2023*   Medicare Annual Wellness Visit  02/16/2024   Flu Shot  Completed   DEXA scan (bone density measurement)  Completed   Hepatitis C Screening  Completed   Zoster (Shingles) Vaccine  Completed   HPV Vaccine  Aged Out  *Topic was postponed. The date shown is not the original due date.    Advanced directives: (Copy Requested) Please bring a copy of your health care power of attorney and living will to the office to be added to your chart at your convenience.  Next Medicare Annual Wellness Visit scheduled for next year: Yes 02/22/24 @ 1:00

## 2023-02-16 NOTE — Progress Notes (Signed)
Subjective:   SKIE VITRANO is a 78 y.o. female who presents for Medicare Annual (Subsequent) preventive examination.  Visit Complete: Virtual I connected with  [name] on 02/16/23 by a audio enabled telemedicine application and verified that I am speaking with the correct person using two identifiers. This patient declined Interactive audio and Acupuncturist. Therefore the visit was completed with audio only.   Patient Location: in the car riding  Provider Location: Office/Clinic  I discussed the limitations of evaluation and management by telemedicine. The patient expressed understanding and agreed to proceed.  Vital Signs: Because this visit was a virtual/telehealth visit, some criteria may be missing or patient reported. Any vitals not documented were not able to be obtained and vitals that have been documented are patient reported.   Cardiac Risk Factors include: advanced age (>54men, >22 women);dyslipidemia;hypertension     Objective:    Today's Vitals   02/16/23 1135  Weight: 143 lb (64.9 kg)  Height: 5' 1.5" (1.562 m)   Body mass index is 26.58 kg/m.     02/16/2023   11:46 AM 07/15/2022   10:44 PM 07/08/2022    3:45 PM 06/07/2022   12:57 PM 06/07/2022    9:58 AM 02/13/2022    3:34 PM 06/26/2021    1:40 PM  Advanced Directives  Does Patient Have a Medical Advance Directive? Yes  Yes No No Yes Yes  Type of Estate agent of [place];Living will  Living will;Healthcare Power of [employer] Power of [place];Living will Living will  Does patient want to make changes to medical advance directive?      No - Patient declined   Copy of Healthcare Power of Attorney in Chart? No - copy requested     No - copy requested   Would patient like information on creating a medical advance directive?    No - Patient declined        Information is confidential and restricted. Go to Review Flowsheets to unlock data.    Current Medications  (verified) Outpatient Encounter Medications as of 02/16/2023  Medication Sig   albuterol (VENTOLIN HFA) 108 (90 Base) MCG/ACT inhaler Inhale 2 puffs into the lungs every 6 (six) hours as needed for wheezing or shortness of breath.   anastrozole (ARIMIDEX) 1 MG tablet Take 1 tablet (1 mg total) by mouth daily.   busPIRone (BUSPAR) 10 MG tablet Take 1 tablet (10 mg total) by mouth 3 (three) times daily.   citalopram (CELEXA) 40 MG tablet Take 1 tablet (40 mg total) by mouth daily.   clonazePAM (KLONOPIN) 0.5 MG tablet Take 1 tablet (0.5 mg total) by mouth 2 (two) times daily.   lamoTRIgine (LAMICTAL) 25 MG tablet Take 1 tablet (25 mg total) by mouth daily after lunch.   potassium chloride (KLOR-CON) 10 MEQ tablet Take one tablet by mouth once daily.   pravastatin (PRAVACHOL) 20 MG tablet Take 1 tablet (20 mg total) by mouth daily.   QUEtiapine (SEROQUEL) 50 MG tablet Take 1 tablet (50 mg total) by mouth at bedtime.   risperiDONE (RISPERDAL) 0.5 MG tablet Take 1 tablet (0.5 mg total) by mouth daily.   Tiotropium Bromide-Olodaterol (STIOLTO RESPIMAT) 2.5-2.5 MCG/ACT AERS Inhale 2 puffs into the lungs daily.   traZODone (DESYREL) 50 MG tablet Take 25-50 mg by mouth at bedtime as needed for sleep.   triamterene-hydrochlorothiazide (MAXZIDE-25) 37.5-25 MG tablet Take 1 tablet by mouth daily each morning for fluid retention.   No facility-administered encounter  medications on file as of 02/16/2023.    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 Antony Blackbird   Hypertension    PONV  (postoperative nausea and vomiting)    Scoliosis    Ulcer    Past Surgical History:  Procedure Laterality Date   ABDOMINAL HYSTERECTOMY     ANTERIOR CERVICAL DECOMP/DISCECTOMY FUSION N/A 04/19/2017   Procedure: Anterior Cervical Decompression Fusion Cervical Three-Four, Cervical Four-Five, Cervical Five-Six;  Surgeon: Donalee Citrin, MD;  Location: [facility] OR;  Service: Neurosurgery;  Laterality: N/A;  Anterior Cervical Decompression Fusion Cervical Three-Four, Cervical Four-Five, Cervical Five-Six   BREAST BIOPSY Right 11/14/2020   [place] Bx, Venus Clip, Invasive Mammary Carcinoma   BREAST LUMPECTOMY Right 2022   With radiation   BREAST LUMPECTOMY WITH RADIOACTIVE SEED AND SENTINEL LYMPH NODE BIOPSY Right 12/27/2020   Procedure: RIGHT BREAST LUMPECTOMY WITH RADIOACTIVE SEED AND AXILLARY SENTINEL LYMPH NODE BIOPSY;  Surgeon: Emelia Loron, MD;  Location: [facility] OR;  Service: General;  Laterality: Right;   CHOLECYSTECTOMY  1996   DILATION AND CURETTAGE OF UTERUS  1971   LAPAROSCOPIC REMOVAL OF MESENTERIC MASS     LUMBAR DISC SURGERY  1998   [facility]   PARTIAL HYSTERECTOMY  1981   prolapsed uterus   TUBAL LIGATION  1978   Family History  Problem Relation Age of Onset   Stroke Mother    Cancer Father        unknown type   Cancer Brother        "blood cancer"   CVA Maternal Grandmother    Healthy Son    Breast cancer Neg Hx    Colon cancer Neg Hx    Mental illness Neg Hx    Social History   Socioeconomic History   Marital status: Married    Spouse name: Mariana Kaufman "[name]"   Number of children: 2   Years of education: Not on file   Highest education level: 12th grade  Occupational History   Occupation: retired Runner, broadcasting/film/video  Tobacco Use   Smoking status: Never   Smokeless tobacco: Never  Vaping Use   Vaping status: Never Used  Substance and Sexual Activity   Alcohol use: No    Alcohol/week: 0.0 standard drinks of alcohol   Drug use: No   Sexual activity: Not Currently    Comment: not asked  if sexually active  Other Topics Concern   Not on file  Social History Narrative   Right handed    Lives with husband   Had breast cancer surgery right side   Worked in the school system    Social Drivers of Health   Financial Resource Strain: Low Risk  (02/16/2023)   Overall Financial Resource Strain (CARDIA)    Difficulty of Paying Living Expenses: Not hard at all  Food Insecurity: No Food Insecurity (02/16/2023)   Hunger Vital Sign    Worried About Running Out of Food in the Last Year: Never true    Ran Out of Food in the Last Year: Never true  Transportation Needs: No Transportation Needs (02/16/2023)   PRAPARE - Administrator, Civil Service (Medical): No    Lack of Transportation (Non-Medical): No  Physical Activity: Inactive (02/16/2023)   Exercise Vital Sign    Days of Exercise per Week: 0 days    Minutes of Exercise per Session: 0 min  Stress: No Stress Concern Present (02/16/2023)   [employer] of Occupational Health - Occupational Stress Questionnaire    Feeling of Stress : Only a little  Recent Concern: Stress - Stress Concern Present (02/11/2023)   [employer] of Occupational Health - Occupational Stress Questionnaire    Feeling of Stress : To some extent  Social Connections: Moderately Integrated (02/16/2023)   Social Connection and Isolation Panel [NHANES]    Frequency of Communication with Friends and Family: More than three times a week    Frequency of Social Gatherings with Friends and Family: Once a week    Attends Religious Services: More than 4 times per year    Active Member of [employer] or Organizations: No    Attends Engineer, structural: Never    Marital Status: Married    Tobacco Counseling Counseling given: Not Answered   Clinical Intake:  Pre-visit preparation completed: Yes  Pain : No/denies pain     BMI - recorded: 26.58 Nutritional Status: BMI 25 -29 Overweight Nutritional Risks: None Diabetes: No  How  often do you need to have someone help you when you read instructions, pamphlets, or other written materials from your doctor or pharmacy?: 1 - Never  Interpreter Needed?: No  Information entered by :: R. Stefanee Mckell LPN   Activities of Daily Living    02/16/2023   11:37 AM 07/15/2022   10:44 PM  In your present state of health, do you have any difficulty performing the following activities:  Hearing? 0   Vision? 0   Difficulty concentrating or making decisions? 1   Walking or climbing stairs? 0   Dressing or bathing? 0   Doing errands, shopping? 1   Preparing Food and eating ? N   Using the Toilet? N   In the past six months, have you accidently leaked urine? N   Do you have problems with loss of bowel control? N   Managing your Medications? N   Managing your Finances? Y   Housekeeping or managing your Housekeeping? N      Information is confidential and restricted. Go to Review Flowsheets to unlock data.    Patient Care Team: Dale [place], MD as PCP - General (Internal Medicine) Antony Blackbird, MD as Consulting Physician (Radiation Oncology) Malachy Mood, MD as Consulting Physician (Hematology and Oncology) Emelia Loron, MD as Consulting Physician (General Surgery) Tat, Octaviano Batty, DO as Consulting Physician (Neurology)  Indicate any recent Medical Services you may have received from other than [facility] providers in the past year (date may be approximate).     Assessment:   This is a routine wellness examination for Deanna.  Hearing/Vision screen Hearing Screening - Comments:: No issues Vision Screening - Comments:: glasses   Goals Addressed             This Visit's Progress    Patient Stated       Wants to stay on her routine       Depression Screen    02/16/2023   11:42 AM 02/11/2023   10:04 AM 02/03/2023    1:03 PM 09/09/2022    1:41 PM 06/26/2022    1:20 PM 06/10/2022  4:10 PM 05/14/2022    5:01 PM  PHQ 2/9 Scores  PHQ - 2 Score 0        PHQ- 9 Score 1         Exception Documentation            Information is confidential and restricted. Go to Review Flowsheets to unlock data.    Fall Risk    02/16/2023   11:38 AM 02/13/2022    3:35 PM 12/16/2021   12:02 PM 11/14/2021    2:57 PM 11/14/2021    9:46 AM  Fall Risk   Falls in the past year? 0 0 0 0 0  Number falls in past yr: 0 0 0 0 0  Injury with Fall? 0 0 0 0 0  Risk for fall due to : No Fall Risks  No Fall Risks No Fall Risks No Fall Risks  Follow up Falls prevention discussed;Falls evaluation completed Falls evaluation completed;Falls prevention discussed Falls evaluation completed Falls evaluation completed Falls evaluation completed    MEDICARE RISK AT HOME: Medicare Risk at Home Any stairs in or around the home?: Yes If so, are there any without handrails?: Yes Home free of loose throw rugs in walkways, pet beds, electrical cords, etc?: Yes Adequate lighting in your home to reduce risk of falls?: Yes Life alert?: No Use of a cane, walker or w/c?: No Grab bars in the bathroom?: No Shower chair or bench in shower?: No Elevated toilet seat or a handicapped toilet?: No  Cognitive Function:    06/26/2022    1:21 PM 06/23/2016    1:38 PM  MMSE - Mini Mental State Exam  Orientation to time  5  Orientation to Place  5  Registration  3  Attention/ Calculation  5  Recall  3  Language- name 2 objects  2  Language- repeat  1  Language- follow 3 step command  3  Language- read & follow direction  1  Write a sentence  1  Copy design  1  Total score  30     Information is confidential and restricted. Go to Review Flowsheets to unlock data.        02/16/2023   11:46 AM 02/13/2022    3:43 PM 09/10/2020    4:17 PM 08/16/2019   11:58 AM 08/15/2018   12:00 PM  6CIT Screen  What Year? 4 points 0 points 4 points 0 points 0 points  What month? 0 points 0 points 0 points 0 points 0 points  What time? 0 points 0 points   0 points  Count back from 20 2 points 0 points   0 points   Months in reverse 2 points 0 points 0 points 0 points 0 points  Repeat phrase 10 points 0 points  6 points 0 points  Total Score 18 points 0 points   0 points    Immunizations Immunization History  Administered Date(s) Administered   Fluad Quad(high Dose 65+) 10/12/2018, 10/28/2020, 10/07/2021   Influenza Split 09/20/2013   Influenza Whole 10/23/2016, 11/13/2016   Influenza, High Dose Seasonal PF 12/20/2017   Influenza-Unspecified 10/20/2011, 09/17/2014, 10/10/2015, 10/11/2019, 10/09/2022   Moderna Covid-19 Fall Seasonal Vaccine 23yrs & older 10/13/2022   Moderna Sars-Covid-2 Vaccination 02/14/2019, 03/14/2019, 11/13/2019, 06/03/2020   Pneumococcal Conjugate-13 10/29/2016   Rsv, Bivalent, Protein Subunit Rsvpref,pf Verdis Frederickson) 12/23/2021   Zoster Recombinant(Shingrix) 07/09/2017, 10/28/2017   Zoster, Live 10/19/2013, 05/16/2015    TDAP status: Due, Education has been provided regarding the importance of  this vaccine. Advised may receive this vaccine at local pharmacy or Health Dept. Aware to provide a copy of the vaccination record if obtained from local pharmacy or Health Dept. Verbalized acceptance and understanding.  Flu Vaccine status: Up to date  Pneumococcal vaccine status: Due, Education has been provided regarding the importance of this vaccine. Advised may receive this vaccine at local pharmacy or Health Dept. Aware to provide a copy of the vaccination record if obtained from local pharmacy or Health Dept. Verbalized acceptance and understanding.  Covid-19 vaccine status: Completed vaccines  Qualifies for Shingles Vaccine? Yes   Zostavax completed Yes   Shingrix Completed?: Yes  Screening Tests Health Maintenance  Topic Date Due   DTaP/Tdap/Td (1 - Tdap) Never done   Colonoscopy  02/25/2017   COVID-19 Vaccine (6 - 2024-25 season) 12/08/2022   MAMMOGRAM  12/25/2022   Pneumonia Vaccine 20+ Years old (2 of 2 - PPSV23 or PCV20) 07/15/2023 (Originally 12/24/2016)    Medicare Annual Wellness (AWV)  02/16/2024   INFLUENZA VACCINE  Completed   DEXA SCAN  Completed   Hepatitis C Screening  Completed   Zoster Vaccines- Shingrix  Completed   HPV VACCINES  Aged Out    Health Maintenance  Health Maintenance Due  Topic Date Due   DTaP/Tdap/Td (1 - Tdap) Never done   Colonoscopy  02/25/2017   COVID-19 Vaccine (6 - 2024-25 season) 12/08/2022   MAMMOGRAM  12/25/2022    Colorectal cancer screening: No longer required.  Age patient declines  Mammogram status: Completed 12/2021. Repeat every year Patient was advised that order was placed 11/20/22 and she needs to call and schedule  Bone Density status: Completed 12/2021. Results reflect: Bone density results: OSTEOPENIA. Repeat every 2 years.  Lung Cancer Screening: (Low Dose CT Chest recommended if Age 52-80 years, 20 pack-year currently smoking OR have quit w/in 15years.) does not qualify.    Additional Screening:  Hepatitis C Screening: does qualify; Completed 02/2019  Vision Screening: Recommended annual ophthalmology exams for early detection of glaucoma and other disorders of the eye. Is the patient up to date with their annual eye exam?  No  Who is the provider or what is the name of the office in which the patient attends annual eye exams? Patient stated that she does not remember who she last saw. Patient was advised that she needs to schedule an eye exam. If pt is not established with a provider, would they like to be referred to a provider to establish care? No .   Dental Screening: Recommended annual dental exams for proper oral hygiene    Community Resource Referral / Chronic Care Management: CRR required this visit?  No   CCM required this visit?  No     Plan:     I have personally reviewed and noted the following in the patient's chart:   Medical and social history Use of alcohol, tobacco or illicit drugs  Current medications and supplements including opioid prescriptions.  Patient is not currently taking opioid prescriptions. Functional ability and status Nutritional status Physical activity Advanced directives List of other physicians Hospitalizations, surgeries, and ER visits in previous 12 months Vitals Screenings to include cognitive, depression, and falls Referrals and appointments  In addition, I have reviewed and discussed with patient certain preventive protocols, quality metrics, and best practice recommendations. A written personalized care plan for preventive services as well as general preventive health recommendations were provided to patient.     Sydell Axon, [place]   [id number]  After Visit Summary: (Pick Up) Due to this being a telephonic visit, with patients personalized plan was offered to patient and patient has requested to Pick up at office.  Nurse Notes: None

## 2023-02-18 NOTE — Telephone Encounter (Signed)
Can we follow up on this referral and reach out to the daughter?

## 2023-02-23 DIAGNOSIS — Z79899 Other long term (current) drug therapy: Secondary | ICD-10-CM | POA: Diagnosis not present

## 2023-02-23 DIAGNOSIS — C50411 Malignant neoplasm of upper-outer quadrant of right female breast: Secondary | ICD-10-CM | POA: Diagnosis not present

## 2023-02-23 DIAGNOSIS — Z981 Arthrodesis status: Secondary | ICD-10-CM | POA: Diagnosis not present

## 2023-02-23 DIAGNOSIS — Z9049 Acquired absence of other specified parts of digestive tract: Secondary | ICD-10-CM | POA: Diagnosis not present

## 2023-02-23 DIAGNOSIS — J4489 Other specified chronic obstructive pulmonary disease: Secondary | ICD-10-CM | POA: Diagnosis not present

## 2023-02-23 DIAGNOSIS — K219 Gastro-esophageal reflux disease without esophagitis: Secondary | ICD-10-CM | POA: Diagnosis not present

## 2023-02-23 DIAGNOSIS — M419 Scoliosis, unspecified: Secondary | ICD-10-CM | POA: Diagnosis not present

## 2023-02-23 DIAGNOSIS — Z9981 Dependence on supplemental oxygen: Secondary | ICD-10-CM | POA: Diagnosis not present

## 2023-02-23 DIAGNOSIS — M1990 Unspecified osteoarthritis, unspecified site: Secondary | ICD-10-CM | POA: Diagnosis not present

## 2023-02-23 DIAGNOSIS — Z170 Estrogen receptor positive status [ER+]: Secondary | ICD-10-CM | POA: Diagnosis not present

## 2023-02-23 DIAGNOSIS — I10 Essential (primary) hypertension: Secondary | ICD-10-CM | POA: Diagnosis not present

## 2023-02-25 ENCOUNTER — Ambulatory Visit: Payer: Self-pay | Admitting: Professional Counselor

## 2023-03-01 ENCOUNTER — Telehealth: Payer: Self-pay | Admitting: *Deleted

## 2023-03-01 ENCOUNTER — Other Ambulatory Visit: Payer: Self-pay | Admitting: Psychiatry

## 2023-03-01 DIAGNOSIS — F411 Generalized anxiety disorder: Secondary | ICD-10-CM

## 2023-03-01 DIAGNOSIS — F3342 Major depressive disorder, recurrent, in full remission: Secondary | ICD-10-CM

## 2023-03-01 NOTE — Telephone Encounter (Signed)
 Copied from CRM [id number]. Topic: General - Other >> Mar 01, 2023  2:15 PM Jon Gills C wrote: Reason for CRM: Jolean from [facility] called in regarding patient stated that they have faxed over a prefill order that is required for patient oxygen DME order

## 2023-03-01 NOTE — Telephone Encounter (Signed)
Copied from CRM [id number]. Topic: General - Other >> Mar 01, 2023  2:15 PM Jon Gills C wrote: Reason for CRM: Jolean from [facility] called in regarding patient stated that they have faxed over a prefill order that is required for patient oxygen DME order

## 2023-03-03 DIAGNOSIS — J4489 Other specified chronic obstructive pulmonary disease: Secondary | ICD-10-CM | POA: Diagnosis not present

## 2023-03-03 DIAGNOSIS — I10 Essential (primary) hypertension: Secondary | ICD-10-CM | POA: Diagnosis not present

## 2023-03-03 DIAGNOSIS — C50411 Malignant neoplasm of upper-outer quadrant of right female breast: Secondary | ICD-10-CM | POA: Diagnosis not present

## 2023-03-03 DIAGNOSIS — Z79899 Other long term (current) drug therapy: Secondary | ICD-10-CM | POA: Diagnosis not present

## 2023-03-03 DIAGNOSIS — Z9049 Acquired absence of other specified parts of digestive tract: Secondary | ICD-10-CM | POA: Diagnosis not present

## 2023-03-03 DIAGNOSIS — M419 Scoliosis, unspecified: Secondary | ICD-10-CM | POA: Diagnosis not present

## 2023-03-03 DIAGNOSIS — M1990 Unspecified osteoarthritis, unspecified site: Secondary | ICD-10-CM | POA: Diagnosis not present

## 2023-03-03 DIAGNOSIS — Z981 Arthrodesis status: Secondary | ICD-10-CM | POA: Diagnosis not present

## 2023-03-03 DIAGNOSIS — Z9981 Dependence on supplemental oxygen: Secondary | ICD-10-CM | POA: Diagnosis not present

## 2023-03-03 DIAGNOSIS — K219 Gastro-esophageal reflux disease without esophagitis: Secondary | ICD-10-CM | POA: Diagnosis not present

## 2023-03-03 DIAGNOSIS — Z170 Estrogen receptor positive status [ER+]: Secondary | ICD-10-CM | POA: Diagnosis not present

## 2023-03-03 NOTE — Telephone Encounter (Signed)
Faxed

## 2023-03-03 NOTE — Telephone Encounter (Signed)
Signed and placed in box.

## 2023-03-03 NOTE — Telephone Encounter (Signed)
Given to you to sign.

## 2023-03-08 DIAGNOSIS — Z79899 Other long term (current) drug therapy: Secondary | ICD-10-CM | POA: Diagnosis not present

## 2023-03-08 DIAGNOSIS — J4489 Other specified chronic obstructive pulmonary disease: Secondary | ICD-10-CM | POA: Diagnosis not present

## 2023-03-08 DIAGNOSIS — Z981 Arthrodesis status: Secondary | ICD-10-CM | POA: Diagnosis not present

## 2023-03-08 DIAGNOSIS — I10 Essential (primary) hypertension: Secondary | ICD-10-CM | POA: Diagnosis not present

## 2023-03-08 DIAGNOSIS — Z9981 Dependence on supplemental oxygen: Secondary | ICD-10-CM | POA: Diagnosis not present

## 2023-03-08 DIAGNOSIS — Z170 Estrogen receptor positive status [ER+]: Secondary | ICD-10-CM | POA: Diagnosis not present

## 2023-03-08 DIAGNOSIS — K219 Gastro-esophageal reflux disease without esophagitis: Secondary | ICD-10-CM | POA: Diagnosis not present

## 2023-03-08 DIAGNOSIS — M419 Scoliosis, unspecified: Secondary | ICD-10-CM | POA: Diagnosis not present

## 2023-03-08 DIAGNOSIS — M1990 Unspecified osteoarthritis, unspecified site: Secondary | ICD-10-CM | POA: Diagnosis not present

## 2023-03-08 DIAGNOSIS — Z9049 Acquired absence of other specified parts of digestive tract: Secondary | ICD-10-CM | POA: Diagnosis not present

## 2023-03-08 DIAGNOSIS — C50411 Malignant neoplasm of upper-outer quadrant of right female breast: Secondary | ICD-10-CM | POA: Diagnosis not present

## 2023-03-09 ENCOUNTER — Ambulatory Visit: Payer: Medicare Other | Admitting: Psychiatry

## 2023-03-10 ENCOUNTER — Telehealth: Payer: Self-pay | Admitting: Internal Medicine

## 2023-03-10 NOTE — Telephone Encounter (Signed)
 fyi

## 2023-03-10 NOTE — Telephone Encounter (Signed)
Randy from [facility] said mrs Garringer refused a visit today

## 2023-03-12 DIAGNOSIS — Z9049 Acquired absence of other specified parts of digestive tract: Secondary | ICD-10-CM | POA: Diagnosis not present

## 2023-03-12 DIAGNOSIS — M419 Scoliosis, unspecified: Secondary | ICD-10-CM | POA: Diagnosis not present

## 2023-03-12 DIAGNOSIS — F410 Panic disorder [episodic paroxysmal anxiety] without agoraphobia: Secondary | ICD-10-CM | POA: Diagnosis not present

## 2023-03-12 DIAGNOSIS — C50411 Malignant neoplasm of upper-outer quadrant of right female breast: Secondary | ICD-10-CM | POA: Diagnosis not present

## 2023-03-12 DIAGNOSIS — M1990 Unspecified osteoarthritis, unspecified site: Secondary | ICD-10-CM | POA: Diagnosis not present

## 2023-03-12 DIAGNOSIS — R32 Unspecified urinary incontinence: Secondary | ICD-10-CM

## 2023-03-12 DIAGNOSIS — F32A Depression, unspecified: Secondary | ICD-10-CM | POA: Diagnosis not present

## 2023-03-12 DIAGNOSIS — J4489 Other specified chronic obstructive pulmonary disease: Secondary | ICD-10-CM | POA: Diagnosis not present

## 2023-03-12 DIAGNOSIS — K219 Gastro-esophageal reflux disease without esophagitis: Secondary | ICD-10-CM | POA: Diagnosis not present

## 2023-03-12 DIAGNOSIS — Z79899 Other long term (current) drug therapy: Secondary | ICD-10-CM | POA: Diagnosis not present

## 2023-03-12 DIAGNOSIS — Z9981 Dependence on supplemental oxygen: Secondary | ICD-10-CM | POA: Diagnosis not present

## 2023-03-12 DIAGNOSIS — I10 Essential (primary) hypertension: Secondary | ICD-10-CM | POA: Diagnosis not present

## 2023-03-15 ENCOUNTER — Other Ambulatory Visit: Payer: Self-pay | Admitting: Psychiatry

## 2023-03-15 DIAGNOSIS — M419 Scoliosis, unspecified: Secondary | ICD-10-CM | POA: Diagnosis not present

## 2023-03-15 DIAGNOSIS — J4489 Other specified chronic obstructive pulmonary disease: Secondary | ICD-10-CM | POA: Diagnosis not present

## 2023-03-15 DIAGNOSIS — Z170 Estrogen receptor positive status [ER+]: Secondary | ICD-10-CM | POA: Diagnosis not present

## 2023-03-15 DIAGNOSIS — F411 Generalized anxiety disorder: Secondary | ICD-10-CM

## 2023-03-15 DIAGNOSIS — M1990 Unspecified osteoarthritis, unspecified site: Secondary | ICD-10-CM | POA: Diagnosis not present

## 2023-03-15 DIAGNOSIS — C50411 Malignant neoplasm of upper-outer quadrant of right female breast: Secondary | ICD-10-CM | POA: Diagnosis not present

## 2023-03-15 DIAGNOSIS — Z9049 Acquired absence of other specified parts of digestive tract: Secondary | ICD-10-CM | POA: Diagnosis not present

## 2023-03-15 DIAGNOSIS — Z9981 Dependence on supplemental oxygen: Secondary | ICD-10-CM | POA: Diagnosis not present

## 2023-03-15 DIAGNOSIS — Z981 Arthrodesis status: Secondary | ICD-10-CM | POA: Diagnosis not present

## 2023-03-15 DIAGNOSIS — F321 Major depressive disorder, single episode, moderate: Secondary | ICD-10-CM

## 2023-03-15 DIAGNOSIS — Z79899 Other long term (current) drug therapy: Secondary | ICD-10-CM | POA: Diagnosis not present

## 2023-03-15 DIAGNOSIS — K219 Gastro-esophageal reflux disease without esophagitis: Secondary | ICD-10-CM | POA: Diagnosis not present

## 2023-03-15 DIAGNOSIS — I10 Essential (primary) hypertension: Secondary | ICD-10-CM | POA: Diagnosis not present

## 2023-03-17 DIAGNOSIS — Z9981 Dependence on supplemental oxygen: Secondary | ICD-10-CM | POA: Diagnosis not present

## 2023-03-17 DIAGNOSIS — I10 Essential (primary) hypertension: Secondary | ICD-10-CM | POA: Diagnosis not present

## 2023-03-17 DIAGNOSIS — Z9049 Acquired absence of other specified parts of digestive tract: Secondary | ICD-10-CM | POA: Diagnosis not present

## 2023-03-17 DIAGNOSIS — Z981 Arthrodesis status: Secondary | ICD-10-CM | POA: Diagnosis not present

## 2023-03-17 DIAGNOSIS — Z170 Estrogen receptor positive status [ER+]: Secondary | ICD-10-CM | POA: Diagnosis not present

## 2023-03-17 DIAGNOSIS — J4489 Other specified chronic obstructive pulmonary disease: Secondary | ICD-10-CM | POA: Diagnosis not present

## 2023-03-17 DIAGNOSIS — K219 Gastro-esophageal reflux disease without esophagitis: Secondary | ICD-10-CM | POA: Diagnosis not present

## 2023-03-17 DIAGNOSIS — M1990 Unspecified osteoarthritis, unspecified site: Secondary | ICD-10-CM | POA: Diagnosis not present

## 2023-03-17 DIAGNOSIS — Z79899 Other long term (current) drug therapy: Secondary | ICD-10-CM | POA: Diagnosis not present

## 2023-03-17 DIAGNOSIS — C50411 Malignant neoplasm of upper-outer quadrant of right female breast: Secondary | ICD-10-CM | POA: Diagnosis not present

## 2023-03-17 DIAGNOSIS — M419 Scoliosis, unspecified: Secondary | ICD-10-CM | POA: Diagnosis not present

## 2023-03-17 DIAGNOSIS — J449 Chronic obstructive pulmonary disease, unspecified: Secondary | ICD-10-CM | POA: Diagnosis not present

## 2023-03-18 ENCOUNTER — Ambulatory Visit: Payer: Medicare Other | Admitting: Professional Counselor

## 2023-03-18 DIAGNOSIS — F411 Generalized anxiety disorder: Secondary | ICD-10-CM

## 2023-03-18 DIAGNOSIS — F331 Major depressive disorder, recurrent, moderate: Secondary | ICD-10-CM

## 2023-03-19 NOTE — Progress Notes (Signed)
  THERAPIST PROGRESS NOTE  Session Time: 11:00 AM - 11:58 AM  Participation Level: Active  Behavioral Response: Well GroomedAlertAnxious and Dysphoric  Type of Therapy: Family Therapy  Treatment Goals addressed: Active Anxiety  LTG: "Be able to deal with things."    Start:  03/18/23    Expected End:  03/16/24     STG: "Emotions. Not get upset so often." To reduce the intensity and duration of negative emotions AEB utilizing emotion regulation skills and restructuring maladaptive patterns of thinking over the next 90 days.    STG: "Just being normal. Socializing." To improve socialization AEB ability to attend church on a regular basis.   ProgressTowards Goals: Initial  Interventions: CBT, Motivational Interviewing, and Assertiveness Training  Summary: Jennifer [name] is a 78 y.o. female who presents with a history of anxiety and depression. She appeared anxious and dysphoric, becoming tearful at times during session. [name] allowed her husband to remain in session, allowing for his input on treatment goals and to gain knowledge of skills to help [name]. [name] shared she is still struggling with anxiety and emotion regulation. She continues to grieve the loss of her sister-in-law and fears she is handling it worse than her brother is. [name] engaged in completing her treatment plan. She would like to be able to attend church again. She was receptive to the cycle of avoidance and gradual exposure to work back up to going. [name] engaged in role play to practice how to respond to church members if/when they ask questions about her absence. She actively listened to breathing exercise and 5-4-3-2-1 and appeared receptive to practicing those skills. Her husband remains supportive and encouraging.   Therapist Response: Conducted session with [name] and her husband. Began session with check-in/update since previous session. Utilized empathetic and reflective listening. Ensured [name] was  comfortable with her husband remaining in session. Developed treatment plan with input from [name] and her husband on current strengths, needs, and progress towards goals. Normalized emotions and explained difference between natural and manufactured emotion. Explained the cycle of avoidance and gradual exposure to help overcome anxiety/fear. Engaged [name] in Paediatric nurse to practice being assertive and responding to questions from others. Explained breathing exercises and 5-4-3-2-1 to help with grounding and regulation. Encouraged consistent practice. Provided handouts so [name] can practice at home. Scheduled additional appointments and concluded session.   Suicidal/Homicidal: No  Plan: Return again in 5 weeks.  Diagnosis: MDD (major depressive disorder), recurrent episode, moderate (HCC)  GAD (generalized anxiety disorder)  Collaboration of Care: Medication Management AEB chart review  Patient/Guardian was advised Release of Information must be obtained prior to any record release in order to collaborate their care with an outside provider. Patient/Guardian was advised if they have not already done so to contact the registration department to sign all necessary forms in order for [place] to release information regarding their care.   Consent: Patient/Guardian gives verbal consent for treatment and assignment of benefits for services provided during this visit. Patient/Guardian expressed understanding and agreed to proceed.   Edmonia Lynch, [facility] 03/19/2023

## 2023-03-22 DIAGNOSIS — M1990 Unspecified osteoarthritis, unspecified site: Secondary | ICD-10-CM | POA: Diagnosis not present

## 2023-03-22 DIAGNOSIS — Z9981 Dependence on supplemental oxygen: Secondary | ICD-10-CM | POA: Diagnosis not present

## 2023-03-22 DIAGNOSIS — Z9049 Acquired absence of other specified parts of digestive tract: Secondary | ICD-10-CM | POA: Diagnosis not present

## 2023-03-22 DIAGNOSIS — I10 Essential (primary) hypertension: Secondary | ICD-10-CM | POA: Diagnosis not present

## 2023-03-22 DIAGNOSIS — Z79899 Other long term (current) drug therapy: Secondary | ICD-10-CM | POA: Diagnosis not present

## 2023-03-22 DIAGNOSIS — Z170 Estrogen receptor positive status [ER+]: Secondary | ICD-10-CM | POA: Diagnosis not present

## 2023-03-22 DIAGNOSIS — J4489 Other specified chronic obstructive pulmonary disease: Secondary | ICD-10-CM | POA: Diagnosis not present

## 2023-03-22 DIAGNOSIS — M419 Scoliosis, unspecified: Secondary | ICD-10-CM | POA: Diagnosis not present

## 2023-03-22 DIAGNOSIS — C50411 Malignant neoplasm of upper-outer quadrant of right female breast: Secondary | ICD-10-CM | POA: Diagnosis not present

## 2023-03-22 DIAGNOSIS — Z981 Arthrodesis status: Secondary | ICD-10-CM | POA: Diagnosis not present

## 2023-03-22 DIAGNOSIS — K219 Gastro-esophageal reflux disease without esophagitis: Secondary | ICD-10-CM | POA: Diagnosis not present

## 2023-03-24 ENCOUNTER — Other Ambulatory Visit: Payer: Self-pay | Admitting: Psychiatry

## 2023-03-24 DIAGNOSIS — F331 Major depressive disorder, recurrent, moderate: Secondary | ICD-10-CM

## 2023-03-30 ENCOUNTER — Other Ambulatory Visit: Payer: Self-pay | Admitting: Psychiatry

## 2023-03-30 DIAGNOSIS — Z981 Arthrodesis status: Secondary | ICD-10-CM | POA: Diagnosis not present

## 2023-03-30 DIAGNOSIS — Z9981 Dependence on supplemental oxygen: Secondary | ICD-10-CM | POA: Diagnosis not present

## 2023-03-30 DIAGNOSIS — F411 Generalized anxiety disorder: Secondary | ICD-10-CM

## 2023-03-30 DIAGNOSIS — K219 Gastro-esophageal reflux disease without esophagitis: Secondary | ICD-10-CM | POA: Diagnosis not present

## 2023-03-30 DIAGNOSIS — M419 Scoliosis, unspecified: Secondary | ICD-10-CM | POA: Diagnosis not present

## 2023-03-30 DIAGNOSIS — Z79899 Other long term (current) drug therapy: Secondary | ICD-10-CM | POA: Diagnosis not present

## 2023-03-30 DIAGNOSIS — M1990 Unspecified osteoarthritis, unspecified site: Secondary | ICD-10-CM | POA: Diagnosis not present

## 2023-03-30 DIAGNOSIS — I10 Essential (primary) hypertension: Secondary | ICD-10-CM | POA: Diagnosis not present

## 2023-03-30 DIAGNOSIS — Z9049 Acquired absence of other specified parts of digestive tract: Secondary | ICD-10-CM | POA: Diagnosis not present

## 2023-03-30 DIAGNOSIS — C50411 Malignant neoplasm of upper-outer quadrant of right female breast: Secondary | ICD-10-CM | POA: Diagnosis not present

## 2023-03-30 DIAGNOSIS — J4489 Other specified chronic obstructive pulmonary disease: Secondary | ICD-10-CM | POA: Diagnosis not present

## 2023-03-30 DIAGNOSIS — Z170 Estrogen receptor positive status [ER+]: Secondary | ICD-10-CM | POA: Diagnosis not present

## 2023-04-13 ENCOUNTER — Other Ambulatory Visit: Payer: Self-pay | Admitting: Psychiatry

## 2023-04-13 DIAGNOSIS — F411 Generalized anxiety disorder: Secondary | ICD-10-CM

## 2023-04-13 DIAGNOSIS — F321 Major depressive disorder, single episode, moderate: Secondary | ICD-10-CM

## 2023-04-14 DIAGNOSIS — M419 Scoliosis, unspecified: Secondary | ICD-10-CM | POA: Diagnosis not present

## 2023-04-14 DIAGNOSIS — J4489 Other specified chronic obstructive pulmonary disease: Secondary | ICD-10-CM | POA: Diagnosis not present

## 2023-04-14 DIAGNOSIS — C50411 Malignant neoplasm of upper-outer quadrant of right female breast: Secondary | ICD-10-CM | POA: Diagnosis not present

## 2023-04-14 DIAGNOSIS — M1990 Unspecified osteoarthritis, unspecified site: Secondary | ICD-10-CM | POA: Diagnosis not present

## 2023-04-14 DIAGNOSIS — Z79899 Other long term (current) drug therapy: Secondary | ICD-10-CM | POA: Diagnosis not present

## 2023-04-14 DIAGNOSIS — Z981 Arthrodesis status: Secondary | ICD-10-CM | POA: Diagnosis not present

## 2023-04-14 DIAGNOSIS — I10 Essential (primary) hypertension: Secondary | ICD-10-CM | POA: Diagnosis not present

## 2023-04-14 DIAGNOSIS — K219 Gastro-esophageal reflux disease without esophagitis: Secondary | ICD-10-CM | POA: Diagnosis not present

## 2023-04-14 DIAGNOSIS — J449 Chronic obstructive pulmonary disease, unspecified: Secondary | ICD-10-CM | POA: Diagnosis not present

## 2023-04-14 DIAGNOSIS — Z9049 Acquired absence of other specified parts of digestive tract: Secondary | ICD-10-CM | POA: Diagnosis not present

## 2023-04-14 DIAGNOSIS — Z170 Estrogen receptor positive status [ER+]: Secondary | ICD-10-CM | POA: Diagnosis not present

## 2023-04-14 DIAGNOSIS — Z9981 Dependence on supplemental oxygen: Secondary | ICD-10-CM | POA: Diagnosis not present

## 2023-04-20 ENCOUNTER — Encounter: Payer: Self-pay | Admitting: Internal Medicine

## 2023-04-20 ENCOUNTER — Ambulatory Visit (INDEPENDENT_AMBULATORY_CARE_PROVIDER_SITE_OTHER): Payer: Medicare Other | Admitting: Internal Medicine

## 2023-04-20 ENCOUNTER — Other Ambulatory Visit: Payer: Self-pay | Admitting: Adult Health

## 2023-04-20 VITALS — BP 122/70 | HR 75 | Temp 98.0°F | Resp 16 | Ht 61.5 in | Wt 150.2 lb

## 2023-04-20 DIAGNOSIS — E7800 Pure hypercholesterolemia, unspecified: Secondary | ICD-10-CM

## 2023-04-20 DIAGNOSIS — I10 Essential (primary) hypertension: Secondary | ICD-10-CM

## 2023-04-20 DIAGNOSIS — M65341 Trigger finger, right ring finger: Secondary | ICD-10-CM | POA: Diagnosis not present

## 2023-04-20 DIAGNOSIS — R251 Tremor, unspecified: Secondary | ICD-10-CM | POA: Diagnosis not present

## 2023-04-20 DIAGNOSIS — Z170 Estrogen receptor positive status [ER+]: Secondary | ICD-10-CM | POA: Diagnosis not present

## 2023-04-20 DIAGNOSIS — R0602 Shortness of breath: Secondary | ICD-10-CM | POA: Diagnosis not present

## 2023-04-20 DIAGNOSIS — C50411 Malignant neoplasm of upper-outer quadrant of right female breast: Secondary | ICD-10-CM | POA: Diagnosis not present

## 2023-04-20 DIAGNOSIS — Z1231 Encounter for screening mammogram for malignant neoplasm of breast: Secondary | ICD-10-CM

## 2023-04-20 DIAGNOSIS — F419 Anxiety disorder, unspecified: Secondary | ICD-10-CM

## 2023-04-20 DIAGNOSIS — Z0000 Encounter for general adult medical examination without abnormal findings: Secondary | ICD-10-CM

## 2023-04-20 DIAGNOSIS — J984 Other disorders of lung: Secondary | ICD-10-CM

## 2023-04-20 DIAGNOSIS — J4520 Mild intermittent asthma, uncomplicated: Secondary | ICD-10-CM

## 2023-04-20 DIAGNOSIS — R739 Hyperglycemia, unspecified: Secondary | ICD-10-CM | POA: Diagnosis not present

## 2023-04-20 DIAGNOSIS — K2100 Gastro-esophageal reflux disease with esophagitis, without bleeding: Secondary | ICD-10-CM | POA: Diagnosis not present

## 2023-04-20 NOTE — Patient Instructions (Addendum)
  [facility] [phone]

## 2023-04-20 NOTE — Assessment & Plan Note (Signed)
 Previously saw Dr Tat for evaluation of tremors.  No evidence of neurodegenerative process.  No focal or lateralizing findings on exam. Discussed tremor today. Wants to address finger issue first and hold on further evaluation for the tremor.

## 2023-04-20 NOTE — Assessment & Plan Note (Addendum)
 Noticed some sob with exertion (small inclines or when she is rushed) as outlined. EKG - SR with no acute ischemic changes. Discussed further w/up. Obtain 5 minute walk. Check echo. F/u with pulmonary.

## 2023-04-20 NOTE — Progress Notes (Signed)
 Subjective:    [name], female    DOB: 09-May-1945, 78 y.o.   MRN: [id number]  Patient here for  Chief Complaint  Patient presents with   Medical Management of Chronic Issues    HPI Here for a physical exam. Changed to f/u given current concerns.  She is accompanied by her husband. History obtained from both of them. Still with increased anxiety. Seeing psychiatry. Does better if can get out and ride around. Does report some sob with exertion. Reports noticing when walking to her neighbor's house - minimal incline. Also notices if she gets in a hurry. No chest pain. No nausea or vomiting. Bowels moving. Does report trigger finger = right fourth finger. "Stuck in the current position." Not able to straighten. Some tremor - hands.    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 Antony Blackbird   Hypertension    PONV (postoperative nausea and vomiting)    Scoliosis    Ulcer    Past Surgical History:  Procedure Laterality Date   ABDOMINAL HYSTERECTOMY     ANTERIOR CERVICAL DECOMP/DISCECTOMY FUSION N/A 04/19/2017   Procedure: Anterior Cervical Decompression Fusion Cervical Three-Four, Cervical Four-Five, Cervical Five-Six;  Surgeon: Donalee Citrin, MD;  Location: [facility] OR;  Service: Neurosurgery;  Laterality: N/A;  Anterior Cervical Decompression Fusion Cervical Three-Four, Cervical Four-Five, Cervical Five-Six   BREAST BIOPSY Right 11/14/2020   [place] Bx, Venus Clip, Invasive Mammary Carcinoma   BREAST LUMPECTOMY Right 2022   With radiation   BREAST LUMPECTOMY WITH RADIOACTIVE SEED AND SENTINEL LYMPH NODE BIOPSY Right 12/27/2020   Procedure: RIGHT BREAST LUMPECTOMY WITH  RADIOACTIVE SEED AND AXILLARY SENTINEL LYMPH NODE BIOPSY;  Surgeon: Emelia Loron, MD;  Location: [facility] OR;  Service: General;  Laterality: Right;   CHOLECYSTECTOMY  1996   DILATION AND CURETTAGE OF UTERUS  1971   LAPAROSCOPIC REMOVAL OF MESENTERIC MASS     LUMBAR DISC SURGERY  1998   [facility]   PARTIAL HYSTERECTOMY  1981   prolapsed uterus   TUBAL LIGATION  1978   Family History  Problem Relation Age of Onset   Stroke Mother    Cancer Father        unknown type   Cancer Brother        "blood cancer"   CVA Maternal Grandmother    Healthy Son    Breast cancer Neg Hx    Colon cancer Neg Hx    Mental illness Neg Hx    Social History   Socioeconomic History   Marital status: Married    Spouse name: Mariana Kaufman "[name]"   Number of children: 2   Years of education: Not on file   Highest education level: 12th grade  Occupational History   Occupation: retired Runner, broadcasting/film/video  Tobacco Use   Smoking status: Never   Smokeless tobacco: Never  Vaping Use   Vaping status: Never Used  Substance and Sexual Activity   Alcohol use: No    Alcohol/week: 0.0 standard drinks of alcohol   Drug use: No   Sexual activity: Not Currently    Comment:  not asked if sexually active  Other Topics Concern   Not on file  Social History Narrative   Right handed    Lives with husband   Had breast cancer surgery right side   Worked in the school system    Social Drivers of Health   Financial Resource Strain: Low Risk  (02/16/2023)   Overall Financial Resource Strain (CARDIA)    Difficulty of Paying Living Expenses: Not hard at all  Food Insecurity: No Food Insecurity (02/16/2023)   Hunger Vital Sign    Worried About Running Out of Food in the Last Year: Never true    Ran Out of Food in the Last Year: Never true  Transportation Needs: No Transportation Needs (02/16/2023)   PRAPARE - Administrator, Civil Service (Medical): No    Lack of Transportation (Non-Medical): No  Physical Activity: Inactive  (02/16/2023)   Exercise Vital Sign    Days of Exercise per Week: 0 days    Minutes of Exercise per Session: 0 min  Stress: No Stress Concern Present (02/16/2023)   [employer] of Occupational Health - Occupational Stress Questionnaire    Feeling of Stress : Only a little  Recent Concern: Stress - Stress Concern Present (02/11/2023)   [employer] of Occupational Health - Occupational Stress Questionnaire    Feeling of Stress : To some extent  Social Connections: Moderately Integrated (02/16/2023)   Social Connection and Isolation Panel [NHANES]    Frequency of Communication with Friends and Family: More than three times a week    Frequency of Social Gatherings with Friends and Family: Once a week    Attends Religious Services: More than 4 times per year    Active Member of [employer] or Organizations: No    Attends Banker Meetings: Never    Marital Status: Married     Review of Systems  Constitutional:  Negative for appetite change and unexpected weight change.  HENT:  Negative for congestion and sinus pressure.   Respiratory:  Negative for cough and chest tightness.        SOB on exertion as outlined.   Cardiovascular:  Negative for chest pain, palpitations and leg swelling.  Gastrointestinal:  Negative for abdominal pain, diarrhea, nausea and vomiting.  Genitourinary:  Negative for difficulty urinating and dysuria.  Musculoskeletal:  Negative for joint swelling and myalgias.       Trigger finger as outlined.   Skin:  Negative for color change and rash.  Neurological:  Negative for dizziness and headaches.  Psychiatric/Behavioral:         Increased anxiety as outlined.        Objective:     BP 122/70   Pulse 75   Temp 98 F (36.7 C)   Resp 16   Ht 5' 1.5" (1.562 m)   Wt 150 lb 3.2 oz (68.1 kg)   SpO2 97%   BMI 27.92 kg/m  Wt Readings from Last 3 Encounters:  04/20/23 150 lb 3.2 oz (68.1 kg)  02/16/23 143 lb (64.9 kg)  01/22/23 140 lb 9.6 oz (63.8  kg)    Physical Exam Vitals reviewed.  Constitutional:      General: She is not in acute distress.    Appearance: Normal appearance.  HENT:     Head: Normocephalic and atraumatic.     Right Ear: External ear normal.     Left Ear: External ear normal.     Mouth/Throat:     Pharynx: No oropharyngeal exudate  or posterior oropharyngeal erythema.  Eyes:     General: No scleral icterus.       Right eye: No discharge.        Left eye: No discharge.     Conjunctiva/sclera: Conjunctivae normal.  Neck:     Thyroid: No thyromegaly.  Cardiovascular:     Rate and Rhythm: Normal rate and regular rhythm.  Pulmonary:     Effort: No respiratory distress.     Breath sounds: Normal breath sounds. No wheezing.  Abdominal:     General: Bowel sounds are normal.     Palpations: Abdomen is soft.     Tenderness: There is no abdominal tenderness.  Musculoskeletal:        General: No swelling or tenderness.     Cervical back: Neck supple. No tenderness.     Comments: Trigger finger - right fourth. Unable to straighten.   Lymphadenopathy:     Cervical: No cervical adenopathy.  Skin:    Findings: No erythema or rash.  Neurological:     Mental Status: She is alert.  Psychiatric:        Mood and Affect: Mood normal.        Behavior: Behavior normal.         Outpatient Encounter Medications as of 04/20/2023  Medication Sig   albuterol (VENTOLIN HFA) 108 (90 Base) MCG/ACT inhaler Inhale 2 puffs into the lungs every 6 (six) hours as needed for wheezing or shortness of breath.   anastrozole (ARIMIDEX) 1 MG tablet Take 1 tablet (1 mg total) by mouth daily.   busPIRone (BUSPAR) 10 MG tablet Take 1 tablet (10 mg total) by mouth 3 (three) times daily.   citalopram (CELEXA) 40 MG tablet Take 1 tablet (40 mg total) by mouth daily.   clonazePAM (KLONOPIN) 0.5 MG tablet Take 1 tablet (0.5 mg total) by mouth 2(two) times daily.   potassium chloride (KLOR-CON) 10 MEQ tablet Take one tablet by mouth once  daily.   pravastatin (PRAVACHOL) 20 MG tablet Take 1 tablet (20 mg total) by mouth daily.   QUEtiapine (SEROQUEL) 50 MG tablet Take 1tablet (50 mg total) by mouth at bedtime.   risperiDONE (RISPERDAL) 0.5 MG tablet Take 1 tablet (0.5 mg total) by mouth daily.   Tiotropium Bromide-Olodaterol (STIOLTO RESPIMAT) 2.5-2.5 MCG/ACT AERS Inhale 2 puffs into the lungs daily.   traZODone (DESYREL) 50 MG tablet Take 25-50 mg by mouth at bedtime as needed for sleep.   triamterene-hydrochlorothiazide (MAXZIDE-25) 37.5-25 MG tablet Take 1 tablet by mouth daily each morning for fluid retention.   [DISCONTINUED] lamoTRIgine (LAMICTAL) 25 MG tablet Take 1 tablet (25 mg total) by mouth daily after lunch.   No facility-administered encounter medications on file as of 04/20/2023.     Lab Results  Component Value Date   WBC 10.6 (H) 04/20/2023   HGB 13.4 04/20/2023   HCT 40.4 04/20/2023   PLT 225.0 04/20/2023   GLUCOSE 88 04/20/2023   CHOL 180 02/08/2023   TRIG 73.0 02/08/2023   HDL 79.30 02/08/2023   LDLDIRECT 148.4 11/03/2012   LDLCALC 86 02/08/2023   ALT 9 04/20/2023   AST 17 04/20/2023   NA 140 04/20/2023   K 4.0 04/20/2023   CL 96 04/20/2023   CREATININE 1.14 04/20/2023   BUN 17 04/20/2023   CO2 34 (H) 04/20/2023   TSH 1.04 04/20/2023   HGBA1C 5.7 02/08/2023       Assessment & Plan:  Health care maintenance Assessment & Plan: Physical was scheduled for  today 04/20/23.  Changed to f/u appt. Colonoscopy 2018  Mammogram 12/24/2021.  Overdue. Scheduled.    Hyperglycemia Assessment & Plan: Low carb diet and exercise. Follow met b and A1c.   Orders: -     Basic metabolic panel with GFR  Hypercholesterolemia Assessment & Plan: On pravastatin.  Low cholesterol diet and exercise.  Follow lipid panel and liver function tests.    Orders: -     Hepatic function panel -     Basic metabolic panel with GFR -     CBC with Differential/Platelet  Essential hypertension, benign Assessment &  Plan: Blood pressure as outlined.  On triam/hctz.  Follow pressures.  Follow metabolic panel. No changes today.    Tremor Assessment & Plan: Previously saw Dr Tat for evaluation of tremors.  No evidence of neurodegenerative process.  No focal or lateralizing findings on exam. Discussed tremor today. Wants to address finger issue first and hold on further evaluation for the tremor.   Orders: -     TSH  SOB (shortness of breath) Assessment & Plan: Noticed some sob with exertion (small inclines or when she is rushed) as outlined. EKG - SR with no acute ischemic changes. Discussed further w/up. Obtain 5 minute walk. Check echo. F/u with pulmonary.   Orders: -     EKG 12-Lead -     ECHOCARDIOGRAM COMPLETE; Future  Encounter for screening mammogram for malignant neoplasm of breast  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female, estrogen receptor positive (HCC) Assessment & Plan: Followed by oncology for f/u breast cancer - last seen 11/20/22. Transitioned from tamoxifen to anastrozole. Not sure if taking. Has f/u planned with oncology 05/24/23. Mammogram scheduled for 05/10/23.    Gastroesophageal reflux disease with esophagitis without hemorrhage Assessment & Plan: No upper symptoms reported.  Continue PPI.    Chronic restrictive lung disease Assessment & Plan: Has been evaluated by pulmonary. With sob on exertion noted today, EKG obtained. EKG - SR with no acute ischemic changes. Discussed further w/up and evaluation. Schedule echo. F/u with pulmonary - question of need for f/u PFTs, etc.    Mild intermittent asthma without complication Assessment & Plan: Continue stiolto.  Has rescue inhaler if needed.  SOB with exertion. W/up planned as outlined.    Anxiety Assessment & Plan: Increased anxiety as outlined. Discussed with her and her husband. She is seeing psychiatry regularly. Continue her current medication regimen. EKG - obtained - QT - no increase.   Continue f/u with psychiatry. Does better if she can get out and ride, etc. Follow.    Trigger finger, right ring finger Assessment & Plan: Persistent. Now unable to straighten. Refer to ortho for further evaluation and treatment.   Orders: -     Ambulatory referral to Orthopedic Surgery     Dale Harlem, MD

## 2023-04-20 NOTE — Assessment & Plan Note (Addendum)
 Physical was scheduled for today 04/20/23.  Changed to f/u appt. Colonoscopy 2018  Mammogram 12/24/2021.  Overdue. Scheduled.

## 2023-04-21 ENCOUNTER — Telehealth: Payer: Self-pay

## 2023-04-21 ENCOUNTER — Ambulatory Visit: Payer: Medicare Other | Admitting: Professional Counselor

## 2023-04-21 ENCOUNTER — Other Ambulatory Visit: Payer: Self-pay | Admitting: Psychiatry

## 2023-04-21 DIAGNOSIS — F411 Generalized anxiety disorder: Secondary | ICD-10-CM

## 2023-04-21 DIAGNOSIS — F331 Major depressive disorder, recurrent, moderate: Secondary | ICD-10-CM

## 2023-04-21 LAB — CBC WITH DIFFERENTIAL/PLATELET
Basophils Absolute: 0.1 10*3/uL (ref 0.0–0.1)
Basophils Relative: 0.9 % (ref 0.0–3.0)
Eosinophils Absolute: 0.8 10*3/uL — ABNORMAL HIGH (ref 0.0–0.7)
Eosinophils Relative: 7.6 % — ABNORMAL HIGH (ref 0.0–5.0)
HCT: 40.4 % (ref 36.0–46.0)
Hemoglobin: 13.4 g/dL (ref 12.0–15.0)
Lymphocytes Relative: 19.7 % (ref 12.0–46.0)
Lymphs Abs: 2.1 10*3/uL (ref 0.7–4.0)
MCHC: 33.3 g/dL (ref 30.0–36.0)
MCV: 95.7 fl (ref 78.0–100.0)
Monocytes Absolute: 0.8 10*3/uL (ref 0.1–1.0)
Monocytes Relative: 7.2 % (ref 3.0–12.0)
Neutro Abs: 6.9 10*3/uL (ref 1.4–7.7)
Neutrophils Relative %: 64.6 % (ref 43.0–77.0)
Platelets: 225 10*3/uL (ref 150.0–400.0)
RBC: 4.22 Mil/uL (ref 3.87–5.11)
RDW: 13.1 % (ref 11.5–15.5)
WBC: 10.6 10*3/uL — ABNORMAL HIGH (ref 4.0–10.5)

## 2023-04-21 LAB — HEPATIC FUNCTION PANEL
ALT: 9 U/L (ref 0–35)
AST: 17 U/L (ref 0–37)
Albumin: 4.4 g/dL (ref 3.5–5.2)
Alkaline Phosphatase: 60 U/L (ref 39–117)
Bilirubin, Direct: 0.1 mg/dL (ref 0.0–0.3)
Total Bilirubin: 0.5 mg/dL (ref 0.2–1.2)
Total Protein: 7.2 g/dL (ref 6.0–8.3)

## 2023-04-21 LAB — BASIC METABOLIC PANEL WITH GFR
BUN: 17 mg/dL (ref 6–23)
CO2: 34 meq/L — ABNORMAL HIGH (ref 19–32)
Calcium: 9.6 mg/dL (ref 8.4–10.5)
Chloride: 96 meq/L (ref 96–112)
Creatinine, Ser: 1.14 mg/dL (ref 0.40–1.20)
GFR: 46.85 mL/min — ABNORMAL LOW (ref >60.00)
Glucose, Bld: 88 mg/dL (ref 70–99)
Potassium: 4 meq/L (ref 3.5–5.1)
Sodium: 140 meq/L (ref 135–145)

## 2023-04-21 LAB — TSH: TSH: 1.04 u[IU]/mL (ref 0.35–5.50)

## 2023-04-21 NOTE — Telephone Encounter (Signed)
 Called and discussed with patient about upcoming appointments that she has. Mammogram has been scheduled. Advised patient to call me if any further questions.

## 2023-04-21 NOTE — Telephone Encounter (Signed)
 Copied from CRM [id number]. Topic: General - Other >> Apr 20, 2023  5:24 PM Denese Killings wrote: Reason for CRM: Patient is requesting a callback from Trish in regards to her after visit summary. She is confused on what's listed.

## 2023-04-21 NOTE — Progress Notes (Unsigned)
  THERAPIST PROGRESS NOTE  Session Time: 2:10 PM - 3:00 PM  Participation Level: Active  Behavioral Response: Well Groomed, Alert, Dysphoric  Type of Therapy: Individual Therapy  Treatment Goals addressed: Active Anxiety  LTG: "Be able to deal with things."                Start:  03/18/23    Expected End:  03/16/24      STG: "Emotions. Not get upset so often." To reduce the intensity and duration of negative emotions AEB utilizing emotion regulation skills and restructuring maladaptive patterns of thinking over the next 90 days.     STG: "Just being normal. Socializing." To improve socialization AEB ability to attend church on a regular basis.   ProgressTowards Goals: Progressing  Interventions: CBT, Motivational Interviewing, and Supportive  Summary: KIMIYAH BLICK is a 78 y.o. female who presents with a history of anxiety and depression. She appeared alert and oriented x5. She seems to be a little more upbeat. However, she reported she still hasn't made it to church. She was tearful as she expressed her fears of going back. She was receptive to practicing what to say and coping skills for grounding, stating, "I understand what you're saying. That makes sense." She agreed she has done things before that made her afraid, "probably every day." She stated she will try to work up to it, but it won't be this week. Her husband continues to remain supportive.  Therapist Response: Conducted session with Tyler Aas. Her husband was present for session as well. Began session with check-in/update since previous session. Utilized empathetic and reflective listening. Inquired about [facility] attending church. Used MI skills to reinforce change talk. Reminded Airika of practicing responses if people ask where she's been. Shared breathing exercises to help reduce anxiety. Discussed building exposure and reminded Tyeesha that we aren't expecting to remove all feelings of anxiety but just want to reduce it to be  management. Scheduled additional appointment and concluded session.   Suicidal/Homicidal: No  Plan: Return again in 2 weeks.  Diagnosis: GAD (generalized anxiety disorder)  Collaboration of Care: Medication Management AEB chart review  Patient/Guardian was advised Release of Information must be obtained prior to any record release in order to collaborate their care with an outside provider. Patient/Guardian was advised if they have not already done so to contact the registration department to sign all necessary forms in order for [place] to release information regarding their care.   Consent: Patient/Guardian gives verbal consent for treatment and assignment of benefits for services provided during this visit. Patient/Guardian expressed understanding and agreed to proceed.   Edmonia Lynch, [facility] 04/21/2023

## 2023-04-22 ENCOUNTER — Other Ambulatory Visit: Payer: Self-pay

## 2023-04-22 ENCOUNTER — Encounter: Payer: Self-pay | Admitting: Internal Medicine

## 2023-04-22 DIAGNOSIS — R944 Abnormal results of kidney function studies: Secondary | ICD-10-CM

## 2023-04-22 NOTE — Telephone Encounter (Signed)
 If nasal congestion and runny nose, would recommend - saline nasal spray 1-2x/day. Also if she has not problems with nasacort - recommend nasacort nasal spray 2 sprays each nostril one time per day. Can do in the evening.

## 2023-04-25 ENCOUNTER — Encounter: Payer: Self-pay | Admitting: Internal Medicine

## 2023-04-25 ENCOUNTER — Telehealth: Payer: Self-pay | Admitting: Internal Medicine

## 2023-04-25 DIAGNOSIS — M65341 Trigger finger, right ring finger: Secondary | ICD-10-CM | POA: Insufficient documentation

## 2023-04-25 NOTE — Assessment & Plan Note (Signed)
 Has been evaluated by pulmonary. With sob on exertion noted today, EKG obtained. EKG - SR with no acute ischemic changes. Discussed further w/up and evaluation. Schedule echo. F/u with pulmonary - question of need for f/u PFTs, etc.

## 2023-04-25 NOTE — Assessment & Plan Note (Signed)
 Low-carb diet and exercise.  Follow met b and A1c.

## 2023-04-25 NOTE — Assessment & Plan Note (Signed)
 Followed by oncology for f/u breast cancer - last seen 11/20/22. Transitioned from tamoxifen to anastrozole. Not sure if taking. Has f/u planned with oncology 05/24/23. Mammogram scheduled for 05/10/23.

## 2023-04-25 NOTE — Assessment & Plan Note (Signed)
 Blood pressure as outlined.  On triam/hctz.  Follow pressures.  Follow metabolic panel. No changes today.

## 2023-04-25 NOTE — Assessment & Plan Note (Signed)
On pravastatin.  Low cholesterol diet and exercise.  Follow lipid panel and liver function tests.   

## 2023-04-25 NOTE — Telephone Encounter (Signed)
 Jennifer [name] has seen Dr Aundria Rud. She needs a f/u scheduled with him. Persistent sob with exertion. Restrictive lung disease. Please schedule f/u appt. Thanks.

## 2023-04-25 NOTE — Assessment & Plan Note (Signed)
 Continue stiolto.  Has rescue inhaler if needed.  SOB with exertion. W/up planned as outlined.

## 2023-04-25 NOTE — Assessment & Plan Note (Signed)
No upper symptoms reported.  Continue PPI.  ?

## 2023-04-25 NOTE — Assessment & Plan Note (Signed)
 Increased anxiety as outlined. Discussed with her and her husband. She is seeing psychiatry regularly. Continue her current medication regimen. EKG - obtained - QT - no increase.  Continue f/u with psychiatry. Does better if she can get out and ride, etc. Follow.

## 2023-04-25 NOTE — Assessment & Plan Note (Signed)
 Persistent. Now unable to straighten. Refer to ortho for further evaluation and treatment.

## 2023-04-27 ENCOUNTER — Other Ambulatory Visit: Payer: Self-pay | Admitting: Psychiatry

## 2023-04-27 DIAGNOSIS — F3342 Major depressive disorder, recurrent, in full remission: Secondary | ICD-10-CM

## 2023-04-27 DIAGNOSIS — F411 Generalized anxiety disorder: Secondary | ICD-10-CM

## 2023-04-28 NOTE — Telephone Encounter (Signed)
 Pt is aware of appt

## 2023-04-28 NOTE — Telephone Encounter (Signed)
 Appt scheduled 5/19 at 11:15 with Dr Aundria Rud. Will notify patient.

## 2023-04-28 NOTE — Telephone Encounter (Signed)
 received fax requesting a refill on the citalopram. pt was last seen on 1-14 next appt 4-28

## 2023-04-29 ENCOUNTER — Telehealth: Payer: Self-pay

## 2023-04-29 NOTE — Telephone Encounter (Signed)
 Copied from CRM [id number]. Topic: General - Other >> Apr 29, 2023 10:10 AM Godfrey Pick wrote: Reason for CRM: patient is requesting a call back from Trish(Nurse) regarding some upcoming appointments that were scheduled.

## 2023-04-30 NOTE — Telephone Encounter (Signed)
 Patient calling to discuss her upcoming appointments. Advised patient to call back if any questions. Provided her with info to [facility].

## 2023-05-03 NOTE — Telephone Encounter (Signed)
Will hold for fax

## 2023-05-03 NOTE — Telephone Encounter (Signed)
 Copied from CRM [id number]. Topic: General - Other >> May 03, 2023  1:25 PM Jennifer [name] wrote: Reason for CRM: [facility] called about an prescription followup on patients oxygen tank, stating she will be refaxing it again

## 2023-05-03 NOTE — Telephone Encounter (Signed)
 Order signed and faxed.

## 2023-05-04 ENCOUNTER — Other Ambulatory Visit: Payer: Self-pay | Admitting: Internal Medicine

## 2023-05-04 ENCOUNTER — Telehealth: Payer: Self-pay

## 2023-05-04 NOTE — Telephone Encounter (Signed)
 Patient calling to clarify her pulmonary appt on may 19. Discussed with patient to remind her of location and time.

## 2023-05-04 NOTE — Telephone Encounter (Signed)
 Copied from CRM [id number]. Topic: Appointments - Appointment Info/Confirmation >> May 04, 2023  9:57 AM Star East wrote: Patient would like to speak with Laveta Pottier about her list of appointments, has questions- [phone]

## 2023-05-05 ENCOUNTER — Ambulatory Visit (INDEPENDENT_AMBULATORY_CARE_PROVIDER_SITE_OTHER): Payer: Medicare Other | Admitting: Professional Counselor

## 2023-05-05 DIAGNOSIS — Z91199 Patient's noncompliance with other medical treatment and regimen due to unspecified reason: Secondary | ICD-10-CM

## 2023-05-05 NOTE — Progress Notes (Signed)
 Patient no-showed today's appointment; appointment was for 05/05/23 for follow-up outpatient therapy.

## 2023-05-06 ENCOUNTER — Other Ambulatory Visit (INDEPENDENT_AMBULATORY_CARE_PROVIDER_SITE_OTHER)

## 2023-05-06 DIAGNOSIS — R944 Abnormal results of kidney function studies: Secondary | ICD-10-CM | POA: Diagnosis not present

## 2023-05-06 NOTE — Addendum Note (Signed)
 Addended by: Thressa Flora D on: 05/06/2023 02:11 PM   Modules accepted: Orders

## 2023-05-07 LAB — BASIC METABOLIC PANEL WITH GFR
BUN/Creatinine Ratio: 14 (calc) (ref 6–22)
BUN: 15 mg/dL (ref 7–25)
CO2: 32 mmol/L (ref 20–32)
Calcium: 9.6 mg/dL (ref 8.6–10.4)
Chloride: 99 mmol/L (ref 98–110)
Creat: 1.04 mg/dL — ABNORMAL HIGH (ref 0.60–1.00)
Glucose, Bld: 87 mg/dL (ref 65–99)
Potassium: 4.3 mmol/L (ref 3.5–5.3)
Sodium: 140 mmol/L (ref 135–146)
eGFR: 56 mL/min/{1.73_m2} — ABNORMAL LOW (ref >=60)

## 2023-05-10 ENCOUNTER — Ambulatory Visit
Admission: RE | Admit: 2023-05-10 | Discharge: 2023-05-10 | Disposition: A | Discharge status: Home / Self Care | Source: Ambulatory Visit | Attending: Adult Health | Admitting: Adult Health

## 2023-05-10 DIAGNOSIS — C50411 Malignant neoplasm of upper-outer quadrant of right female breast: Secondary | ICD-10-CM | POA: Insufficient documentation

## 2023-05-10 DIAGNOSIS — R928 Other abnormal and inconclusive findings on diagnostic imaging of breast: Secondary | ICD-10-CM | POA: Diagnosis not present

## 2023-05-10 DIAGNOSIS — Z170 Estrogen receptor positive status [ER+]: Secondary | ICD-10-CM | POA: Insufficient documentation

## 2023-05-10 DIAGNOSIS — R92323 Mammographic fibroglandular density, bilateral breasts: Secondary | ICD-10-CM | POA: Diagnosis not present

## 2023-05-15 DIAGNOSIS — J449 Chronic obstructive pulmonary disease, unspecified: Secondary | ICD-10-CM | POA: Diagnosis not present

## 2023-05-17 ENCOUNTER — Other Ambulatory Visit: Payer: Self-pay

## 2023-05-17 ENCOUNTER — Ambulatory Visit: Payer: Medicare Other | Admitting: Psychiatry

## 2023-05-17 ENCOUNTER — Telehealth: Payer: Self-pay

## 2023-05-17 ENCOUNTER — Encounter: Payer: Self-pay | Admitting: Psychiatry

## 2023-05-17 VITALS — BP 114/72 | HR 75 | Temp 97.4°F | Ht 61.5 in | Wt 151.2 lb

## 2023-05-17 DIAGNOSIS — Z634 Disappearance and death of family member: Secondary | ICD-10-CM | POA: Diagnosis not present

## 2023-05-17 DIAGNOSIS — F3342 Major depressive disorder, recurrent, in full remission: Secondary | ICD-10-CM | POA: Diagnosis not present

## 2023-05-17 DIAGNOSIS — Z91148 Patient's other noncompliance with medication regimen for other reason: Secondary | ICD-10-CM | POA: Diagnosis not present

## 2023-05-17 DIAGNOSIS — F411 Generalized anxiety disorder: Secondary | ICD-10-CM | POA: Diagnosis not present

## 2023-05-17 MED ORDER — QUETIAPINE FUMARATE 50 MG PO TABS: 50.0000 mg | ORAL_TABLET | Freq: Every day | ORAL | 5 refills | Status: DC

## 2023-05-17 MED ORDER — CLONAZEPAM 0.5 MG PO TABS: 0.5000 mg | ORAL_TABLET | Freq: Two times a day (BID) | ORAL | 5 refills | Status: DC

## 2023-05-17 MED ORDER — RISPERIDONE 0.5 MG PO TABS: 0.5000 mg | ORAL_TABLET | Freq: Every day | ORAL | 5 refills | Status: DC

## 2023-05-17 NOTE — Telephone Encounter (Signed)
 Copied from CRM [id number]. Topic: Appointments - Appointment Info/Confirmation >> May 17, 2023  2:22 PM Howard Macho wrote: Patient/patient representative is calling for information regarding an appointment.  Patient called stating she is confused on some appointments that she has and she would like the cma to give her a call

## 2023-05-17 NOTE — Progress Notes (Signed)
 [facility] MD OP Progress Note  05/17/2023 3:25 PM Jennifer [name]  MRN:  [id number]  Chief Complaint:  Chief Complaint  Patient presents with   Follow-up   Depression   Anxiety   Medication Refill   Discussed the use of AI scribe software for clinical note transcription with the patient, who gave verbal consent to proceed.  History of Present Illness Jennifer [name] is a 78 year old Caucasian female, married, retired, lives in [place], has a history of GAD, MDD, COPD, history of malignant neoplasm of upper outer quadrant of right breast, estrogen positive, status post lumpectomy and radiation was evaluated in office today.  Patient presented along with her husband who provided collateral information.  She is attending therapy sessions with Ms.Elizabeth Ganey and reports that therapy is going well. She experiences fluctuations in her mood with good and bad days but is generally doing better. Her next therapy appointment is scheduled for Wednesday at 2 PM.  She has improved her eating habits, particularly at dinner, although she does not eat breakfast and sometimes skips lunch. Her weight is stable at 151 pounds. She consumes about two cups of coffee daily.  Her last hospitalization was in June 2024. She has not experienced significant depression, anxiety attacks, or panic recently. However, she is grieving the recent loss of her sister-in-law, which has been emotionally challenging.  She is currently on trazodone  (half to one tablet as needed), risperidone  0.5 mg, Seroquel  50 mg at bedtime, clonazepam  twice a day and Buspar  10 mg three times a day. Her medications are organized by the pharmacy in a pillbox, and she has no issues with medication adherence.  Her recent EKG on April 1st showed a QTC of 420. She has not experienced any heart palpitations or chest pain. She reports good sleep quality.  She engages in activities such as reading and word puzzles to keep her mind active and enjoys  spending time with family and friends. She performs daily exercises for strength training and walks when the weather permits.  She appeared to be alert, oriented to person place time situation.  3 word memory immediate 3 out of 3, after 5 minutes 1 out of 3.  Attention and focus seem to be good, was able to do serial sevens.     Visit Diagnosis:    ICD-10-CM   1. GAD (generalized anxiety disorder)  F41.1 clonazePAM  (KLONOPIN ) 0.5 MG tablet    QUEtiapine  (SEROQUEL ) 50 MG tablet    risperiDONE  (RISPERDAL ) 0.5 MG tablet    2. MDD (major depressive disorder), recurrent, in full remission (HCC)  F33.42 QUEtiapine  (SEROQUEL ) 50 MG tablet    3. Bereavement  Z63.4     4. Overuse of medication  Z91.148    BZD per history      Past Psychiatric History: I have reviewed past psychiatric history from progress note on 04/08/2022.  Past trials of BuSpar , Celexa , lorazepam .  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 Lynwood Nasuti   Hypertension    PONV (postoperative nausea and vomiting)  Scoliosis    Ulcer     Past Surgical History:  Procedure Laterality Date   ABDOMINAL HYSTERECTOMY     ANTERIOR CERVICAL DECOMP/DISCECTOMY FUSION N/A 04/19/2017   Procedure: Anterior Cervical Decompression Fusion Cervical Three-Four, Cervical Four-Five, Cervical Five-Six;  Surgeon: Onetha Kuba, MD;  Location[facility];  Service: Neurosurgery;  Laterality: N/A;  Anterior Cervical Decompression Fusion Cervical Three-Four, Cervical Four-Five, Cervical Five-Six   BREAST BIOPSY Right 11/14/2020   US  Bx, Venus Clip, Invasive Mammary Carcinoma   BREAST LUMPECTOMY Right 2022   With  radiation   BREAST LUMPECTOMY WITH RADIOACTIVE SEED AND SENTINEL LYMPH NODE BIOPSY Right 12/27/2020   Procedure: RIGHT BREAST LUMPECTOMY WITH RADIOACTIVE SEED AND AXILLARY SENTINEL LYMPH NODE BIOPSY;  Surgeon: Ebbie Cough, MD;  Location: [facility] OR;  Service: General;  Laterality: Right;   CHOLECYSTECTOMY  1996   DILATION AND CURETTAGE OF UTERUS  1971   LAPAROSCOPIC REMOVAL OF MESENTERIC MASS     LUMBAR DISC SURGERY  1998   [facility]   PARTIAL HYSTERECTOMY  1981   prolapsed uterus   TUBAL LIGATION  1978    Family Psychiatric History: I have reviewed family psychiatric history from progress note on 04/08/2022.  Family History:  Family History  Problem Relation Age of Onset   Stroke Mother    Cancer Father        unknown type   Cancer Brother        blood cancer   CVA Maternal Grandmother    Healthy Son    Breast cancer Neg Hx    Colon cancer Neg Hx    Mental illness Neg Hx     Social History: I have reviewed social history from progress note on 04/08/2022. Social History   Socioeconomic History   Marital status: Married    Spouse name: Ellender Burden   Number of children: 2   Years of education: Not on file   Highest education level: 12th grade  Occupational History   Occupation: retired runner, broadcasting/film/video  Tobacco Use   Smoking status: Never   Smokeless tobacco: Never  Vaping Use   Vaping status: Never Used  Substance and Sexual Activity   Alcohol use: No    Alcohol/week: 0.0 standard drinks of alcohol   Drug use: No   Sexual activity: Not Currently    Comment: not asked if sexually active  Other Topics Concern   Not on file  Social History Narrative   Right handed    Lives with husband   Had breast cancer surgery right side   Worked in the school system    Social Drivers of Health   Financial Resource Strain: Low Risk  (02/16/2023)   Overall Financial Resource Strain (CARDIA)    Difficulty of Paying Living Expenses: Not hard at all  Food Insecurity: No Food  Insecurity (02/16/2023)   Hunger Vital Sign    Worried About Running Out of Food in the Last Year: Never true    Ran Out of Food in the Last Year: Never true  Transportation Needs: No Transportation Needs (02/16/2023)   PRAPARE - Administrator, Civil Service (Medical): No    Lack of Transportation (Non-Medical): No  Physical Activity: Inactive (02/16/2023)   Exercise Vital Sign    Days of Exercise per Week: 0 days    Minutes of Exercise per Session: 0 min  Stress: No Stress Concern Present (02/16/2023)   [employer] of Occupational Health - Occupational Stress Questionnaire    Feeling of Stress :  Only a little  Recent Concern: Stress - Stress Concern Present (02/11/2023)   [employer] of Occupational Health - Occupational Stress Questionnaire    Feeling of Stress : To some extent  Social Connections: Moderately Integrated (02/16/2023)   Social Connection and Isolation Panel [NHANES]    Frequency of Communication with Friends and Family: More than three times a week    Frequency of Social Gatherings with Friends and Family: Once a week    Attends Religious Services: More than 4 times per year    Active Member of [employer] or Organizations: No    Attends Banker Meetings: Never    Marital Status: Married    Allergies:  Allergies  Allergen Reactions   Advair Diskus [Fluticasone -Salmeterol] Other (See Comments)    Causes blisters in her mouth   Codeine     Unknown reaction   Pollen Extract Cough    Metabolic Disorder Labs: Lab Results  Component Value Date   HGBA1C 5.7 02/08/2023   No results found for: PROLACTIN Lab Results  Component Value Date   CHOL 180 02/08/2023   TRIG 73.0 02/08/2023   HDL 79.30 02/08/2023   CHOLHDL 2 02/08/2023   VLDL 14.6 02/08/2023   LDLCALC 86 02/08/2023   LDLCALC 64 10/07/2022   Lab Results  Component Value Date   TSH 1.04 04/20/2023   TSH 1.21 02/08/2023    Therapeutic Level Labs: No results found for:  LITHIUM No results found for: VALPROATE No results found for: CBMZ  Current Medications: Current Outpatient Medications  Medication Sig Dispense Refill   albuterol  (VENTOLIN  HFA) 108 (90 Base) MCG/ACT inhaler Inhale 2 puffs into the lungs every 6 (six) hours as needed for wheezing or shortness of breath. 8 g 2   anastrozole  (ARIMIDEX ) 1 MG tablet Take 1 tablet (1 mg total) by mouth daily. 30 tablet 11   busPIRone  (BUSPAR ) 10 MG tablet Take 1 tablet (10 mg total) by mouth 3 (three) times daily. 90 tablet 1   citalopram  (CELEXA ) 40 MG tablet Take 1 tablet (40 mg total) by mouth daily. 90 tablet 0   lamoTRIgine  (LAMICTAL ) 25 MG tablet Take 1 tablet (25 mg total) by mouth daily after lunch. 30 tablet 1   potassium chloride  (KLOR-CON ) 10 MEQ tablet Take one tablet by mouth once daily. 90 tablet 1   pravastatin  (PRAVACHOL ) 20 MG tablet Take 1 tablet (20 mg total) by mouth daily. 30 tablet 1   Tiotropium Bromide -Olodaterol (STIOLTO RESPIMAT ) 2.5-2.5 MCG/ACT AERS Inhale 2 puffs into the lungs daily. 4 g 11   traZODone  (DESYREL ) 50 MG tablet Take 25-50 mg by mouth at bedtime as needed for sleep.     triamterene -hydrochlorothiazide  (MAXZIDE -25) 37.5-25 MG tablet Take 1 tablet by mouth daily each morning for fluid retention. 90 tablet 1   [START ON 06/02/2023] clonazePAM  (KLONOPIN ) 0.5 MG tablet Take 1 tablet (0.5 mg total) by mouth 2 (two) times daily. 60 tablet 5   QUEtiapine  (SEROQUEL ) 50 MG tablet Take 1 tablet (50 mg total) by mouth at bedtime. 30 tablet 5   risperiDONE  (RISPERDAL ) 0.5 MG tablet Take 1 tablet (0.5 mg total) by mouth daily. 30 tablet 5   No current facility-administered medications for this visit.     Musculoskeletal: Strength & Muscle Tone: within normal limits Gait & Station: normal Patient leans: N/A  Psychiatric Specialty Exam: Review of Systems  Psychiatric/Behavioral:  The patient is nervous/anxious.        Grieving    Blood pressure 114/72, pulse 75,  temperature (!) 97.4 F (36.3 C), temperature source Temporal, height 5' 1.5 (1.562 m), weight 151 lb 3.2 oz (68.6 kg).Body mass index is 28.11 kg/m.  General Appearance: Casual  Eye Contact:  Fair  Speech:  Clear and Coherent  Volume:  Normal  Mood:  Anxious, Grieving  Affect:  Appropriate  Thought Process:  Goal Directed and Descriptions of Associations: Intact  Orientation:  Full (Time, Place, and Person)  Thought Content: Logical   Suicidal Thoughts:  No  Homicidal Thoughts:  No  Memory:  Immediate;   Fair Recent;   Fair Remote;   limited  Judgement:  Fair  Insight:  Fair  Psychomotor Activity:  Normal  Concentration:  Concentration: Fair and Attention Span: Fair  Recall:   limited  Fund of Knowledge: Fair  Language: Fair  Akathisia:  No  Handed:  Right  AIMS (if indicated): done  Assets:  Desire for Improvement Housing Social Support Transportation  ADL's:  Intact  Cognition: WNL  Sleep:  Fair   Screenings: Midwife Visit from 05/17/2023 in [facility] Office Visit from 02/02/2023 in [facility] Office Visit from 09/09/2022 in [facility]  AIMS Total Score 0 0 0      AUDIT    Flowsheet Row Admission (Discharged) from 07/15/2022 in [facility]  Alcohol Use Disorder Identification Test Final Score (AUDIT) 0      GAD-7    Flowsheet Row Office Visit from 05/17/2023 in [facility] Counselor from 02/11/2023 in [facility] Office Visit from 09/09/2022 in [facility] Office Visit from 06/26/2022 in [facility] Office Visit from 06/10/2022 in [facility]  Total GAD-7 Score 4 8 4 21 21       Mini-Mental     Flowsheet Row Office Visit from 06/26/2022 in [facility] Clinical Support from 06/23/2016 in [facility]  Total Score (max 30 points ) 26 30      PHQ2-9    Flowsheet Row Office Visit from 05/17/2023 in [facility] Clinical Support from 02/16/2023 in [facility] [employer] from 02/11/2023 in [facility] Office Visit from 09/09/2022 in [facility] Office Visit from 06/26/2022 in [facility]  PHQ-2 Total Score 0 0 1 2 6   PHQ-9 Total Score 3 1 2 6 25       Flowsheet Row Office Visit from 05/17/2023 in [facility] Counselor from 02/11/2023 in [facility] Office Visit from 02/02/2023 in [facility]  C-SSRS RISK CATEGORY No Risk No Risk No Risk        Assessment and Plan:Laruen LAKASHA MCFALL is a 78 year old Caucasian female, married, retired, lives in [place], has a history of depression, anxiety, breast cancer in remission was evaluated in office today, presented for a follow-up, discussed assessment and plan as noted below.  Assessment & Plan Anxiety disorder-improving Anxiety disorder is well-managed with current medication regimen. Reports improvement in anxiety attacks and shakiness.Therapy with Ms. Almarie Monas is beneficial. - Continue Buspar  10 mg three times daily. - Continue Celexa  40 mg daily - Continue Klonopin  0.5 mg twice a day. -  Reviewed [place] PMP AWARxE - Continue therapy sessions with Ms. Almarie Monas.  Depression in remission Depression is well-controlled with no significant depressive symptoms. Not experiencing excessive crying or sadness. Therapy and medication regimen are  effective. - Continue Celexa  40 mg daily - Continue BuSpar  10 mg 3 times a day - Continue Lamictal  25 mg daily after lunch - Continue Seroquel  50 mg at bedtime - Continue Risperidone  0.5 mg daily - Continue Trazodone  25-50 mg at bedtime as needed for sleep - Continue therapy sessions with Ms. Almarie Ligas. - Patient aware of long-term adverse effects of being on 2 antipsychotic medications like Seroquel  and Risperidone  as well as cardiac effect of medications like Celexa  and atypical antipsychotics.  Will consider reducing medications to monotherapy in the future however at this time benefit outweighs risk since this is maintaining patient's anxiety and depression symptom control. - EKG reviewed dated 04/21/2023-sinus rhythm with no acute ischemic changes, QTc-420.  Grief-improving Experiencing grief due to the recent loss of her sister-in-law. Discussed the importance of expressing grief and utilizing support systems such as family, friends, and therapy. Encouraged to engage in activities that honor her sister-in-law's memory. - Continue therapy sessions with Ms. Almarie Monas. - Encourage open communication with family and friends. - Engage in activities that help process grief, such as journaling or reminiscing about positive memories.  Follow-up Follow-up in clinic in 2 to 3 months or sooner if needed.   Collaboration of Care: Collaboration of Care: Referral or follow-up with counselor/therapist AEB patient is to continue CBT  Patient/Guardian was advised Release of Information must be obtained prior to any record release in order to collaborate their care with an outside provider. Patient/Guardian was advised if they have not already done so to contact the registration department to sign all necessary forms in order for us  to release information regarding their care.   Consent: Patient/Guardian gives verbal consent for treatment and assignment of benefits for services provided  during this visit. Patient/Guardian expressed understanding and agreed to proceed.  This note was generated in part or whole with voice recognition software. Voice recognition is usually quite accurate but there are transcription errors that can and very often do occur. I apologize for any typographical errors that were not detected and corrected.     Naylene Foell, MD 05/19/2023, 8:12 AM

## 2023-05-19 ENCOUNTER — Ambulatory Visit: Payer: Medicare Other | Admitting: Professional Counselor

## 2023-05-19 DIAGNOSIS — F411 Generalized anxiety disorder: Secondary | ICD-10-CM | POA: Diagnosis not present

## 2023-05-19 NOTE — Progress Notes (Signed)
  THERAPIST PROGRESS NOTE  Session Time: 2:10 PM - 2:40 PM  Participation Level: Active  Behavioral Response: Well Groomed, Alert, Euthymic  Type of Therapy: Individual Therapy  Treatment Goals addressed: Active Anxiety  LTG: "Be able to deal with things."                Start:  03/18/23    Expected End:  03/16/24      STG: "Emotions. Not get upset so often." To reduce the intensity and duration of negative emotions AEB utilizing emotion regulation skills and restructuring maladaptive patterns of thinking over the next 90 days.     STG: "Just being normal. Socializing." To improve socialization AEB ability to attend church on a regular basis.   ProgressTowards Goals: Progressing  Interventions: Motivational Interviewing  Summary: Jennifer [name] is a 78 y.o. female who presents with a history of anxiety and depression. She appeared alert and oriented x5. She reported she is doing well. She has been getting out of the house and socializing more. [name] still hasn't attended church but continues to want to work towards this. She was receptive to cycle of avoidance. She reported a beach trip this weekend may allow her the opportunity to attend a church service there. She agreed it doesn't trigger her anxiety to attend church there, she agreed it may be helpful practice.  Therapist Response: Conducted session with Jennifer [name]. Her husband was preset during session. Began session with check-in/update since previous session. Discussed concerns about health insurance and contacted [facility] and [facility] credentialing department. Utilized empathetic and reflective listening. Used open-ended questions to facilitate discussion and summarized Deziray's thoughts/feelings. Reminded her of the cycle of avoidance and concern for intensification. Encouraged her to attend church service at the beach, although it doesn't trigger her anxiety as attending her home church does. Scheduled additional appointment and concluded session.    Suicidal/Homicidal: No  Plan: Return again in 3 weeks.  Diagnosis: GAD (generalized anxiety disorder)  Collaboration of Care: Medication Management AEB chart review  Patient/Guardian was advised Release of Information must be obtained prior to any record release in order to collaborate their care with an outside provider. Patient/Guardian was advised if they have not already done so to contact the registration department to sign all necessary forms in order for us  to release information regarding their care.   Consent: Patient/Guardian gives verbal consent for treatment and assignment of benefits for services provided during this visit. Patient/Guardian expressed understanding and agreed to proceed.   Len Quale, [facility] 05/19/2023

## 2023-05-21 NOTE — Telephone Encounter (Signed)
 Spoke with pt and she stated that she just wasn't sure where the appt for her finger was. I let pt know that the appt is at [facility]. Directions to get there were given to her husband. Husband gave a verbal understanding.

## 2023-05-24 ENCOUNTER — Ambulatory Visit: Payer: Medicare Other | Admitting: Adult Health

## 2023-05-24 ENCOUNTER — Other Ambulatory Visit: Payer: Medicare Other

## 2023-05-24 ENCOUNTER — Telehealth: Payer: Self-pay | Admitting: Internal Medicine

## 2023-05-24 NOTE — Telephone Encounter (Signed)
 Copied from CRM [id number]. Topic: Clinical - Prescription Issue >> May 24, 2023 12:23 PM Magdalene [employer] wrote: Reason for CRM: Jennifer [name] from [facility] calling regarding oxygen  prescription missing readers per minute and diagnosis code. call back number [phone] Fax: [phone]

## 2023-05-25 ENCOUNTER — Other Ambulatory Visit: Payer: Self-pay | Admitting: *Deleted

## 2023-05-25 DIAGNOSIS — C50411 Malignant neoplasm of upper-outer quadrant of right female breast: Secondary | ICD-10-CM

## 2023-05-25 DIAGNOSIS — M65341 Trigger finger, right ring finger: Secondary | ICD-10-CM | POA: Diagnosis not present

## 2023-05-26 ENCOUNTER — Telehealth: Payer: Self-pay

## 2023-05-26 ENCOUNTER — Inpatient Hospital Stay: Admitting: Adult Health

## 2023-05-26 ENCOUNTER — Inpatient Hospital Stay: Attending: Adult Health

## 2023-05-26 NOTE — Telephone Encounter (Signed)
 Order corrected and re-faxed

## 2023-05-26 NOTE — Telephone Encounter (Signed)
 Attempted to call pt regarding [place]/NS to lab and NP visit. LVM for call back.

## 2023-05-31 ENCOUNTER — Ambulatory Visit: Attending: Internal Medicine

## 2023-05-31 DIAGNOSIS — R0602 Shortness of breath: Secondary | ICD-10-CM | POA: Diagnosis not present

## 2023-05-31 LAB — ECHOCARDIOGRAM COMPLETE
AR max vel: 2.32 cm2
AV Area VTI: 2.64 cm2
AV Area mean vel: 2.26 cm2
AV Mean grad: 2 mmHg
AV Peak grad: 4.4 mmHg
Ao pk vel: 1.05 m/s
Area-P 1/2: 2.99 cm2
S' Lateral: 2.34 cm

## 2023-06-02 ENCOUNTER — Ambulatory Visit: Payer: Self-pay | Admitting: Internal Medicine

## 2023-06-04 ENCOUNTER — Telehealth: Payer: Self-pay | Admitting: Student in an Organized Health Care Education/Training Program

## 2023-06-04 NOTE — Telephone Encounter (Signed)
 Received fax for pre-operative risk assessment. Patient needs to be seen in clinic per clinic protocol as last seen 07/2022.  Vergia Glasgow, MD [facility] 06/04/2023 4:18 PM

## 2023-06-07 ENCOUNTER — Ambulatory Visit: Admitting: Student in an Organized Health Care Education/Training Program

## 2023-06-07 ENCOUNTER — Encounter: Payer: Self-pay | Admitting: Student in an Organized Health Care Education/Training Program

## 2023-06-07 VITALS — BP 110/60 | HR 102 | Temp 97.7°F | Ht 61.5 in | Wt 150.8 lb

## 2023-06-07 DIAGNOSIS — M419 Scoliosis, unspecified: Secondary | ICD-10-CM

## 2023-06-07 DIAGNOSIS — J984 Other disorders of lung: Secondary | ICD-10-CM

## 2023-06-07 DIAGNOSIS — R0602 Shortness of breath: Secondary | ICD-10-CM

## 2023-06-07 MED ORDER — STIOLTO RESPIMAT 2.5-2.5 MCG/ACT IN AERS: 2.0000 | INHALATION_SPRAY | Freq: Every day | RESPIRATORY_TRACT | 11 refills | Status: AC

## 2023-06-07 NOTE — Progress Notes (Signed)
 Assessment & Plan:   1. Shortness of breath 2. 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  She also has chronic hypoxic respiratory failure for which she was prescribed oxygen  which she is using inconsistently.  She was previously switched to Stiolto Respimat  which she is currently prescribed.  Her PFTs showed a very slow but steady decline, with last FVC of 0.89 L in 2022  Given she was unable to obtain her PFTs following her last visit, I will reorder these to reevaluate the degree of her restriction.  I will also order a high-resolution chest CT to evaluate her lung parenchyma to ensure no other concomitant process contributing to her shortness of breath.  I will represcribe her Stiolto Respimat  today and have encouraged her to use her oxygen  nocturnally and with exertion.  - Pulmonary Function Test; Future - CT CHEST HIGH RESOLUTION; Future - Tiotropium Bromide -Olodaterol (STIOLTO RESPIMAT ) 2.5-2.5 MCG/ACT AERS; Inhale 2 puffs into the lungs daily.  Dispense: 4 g; Refill: 11   Return in about 3 months (around 09/07/2023).  I spent 30 minutes caring for this patient today, including preparing to see the patient, obtaining a medical history , reviewing a separately obtained history, performing a medically appropriate examination and/or evaluation, counseling and educating the patient/family/caregiver, ordering medications, tests, or procedures, and documenting clinical information in the electronic health record  Vergia Glasgow, MD [facility]   End of visit medications:  Meds ordered this encounter  Medications   Tiotropium Bromide -Olodaterol (STIOLTO RESPIMAT ) 2.5-2.5 MCG/ACT AERS    Sig: Inhale 2 puffs into the lungs daily.    Dispense:  4 g    Refill:  11     Current Outpatient Medications:    albuterol   (VENTOLIN  HFA) 108 (90 Base) MCG/ACT inhaler, Inhale 2 puffs into the lungs every 6 (six) hours as needed for wheezing or shortness of breath., Disp: 8 g, Rfl: 2   anastrozole  (ARIMIDEX ) 1 MG tablet, Take 1 tablet (1 mg total) by mouth daily., Disp: 30 tablet, Rfl: 11   busPIRone  (BUSPAR ) 10 MG tablet, Take 1 tablet (10 mg total) by mouth 3 (three) times daily., Disp: 90 tablet, Rfl: 1   citalopram  (CELEXA ) 40 MG tablet, Take 1 tablet (40 mg total) by mouth daily., Disp: 90 tablet, Rfl: 0   clonazePAM  (KLONOPIN ) 0.5 MG tablet, Take 1 tablet (0.5 mg total) by mouth 2 (two) times daily., Disp: 60 tablet, Rfl: 5   lamoTRIgine  (LAMICTAL ) 25 MG tablet, Take 1 tablet (25 mg total) by mouth daily after lunch., Disp: 30 tablet, Rfl: 1   potassium chloride  (KLOR-CON ) 10 MEQ tablet, Take one tablet by mouth once daily., Disp: 90 tablet, Rfl: 1   pravastatin  (PRAVACHOL ) 20 MG tablet, Take 1 tablet (20 mg total) by mouth daily., Disp: 30 tablet, Rfl: 1   QUEtiapine  (SEROQUEL ) 50 MG tablet, Take 1 tablet (50 mg total) by mouth at bedtime., Disp: 30 tablet, Rfl: 5   risperiDONE  (RISPERDAL ) 0.5 MG tablet, Take 1 tablet (0.5 mg total) by mouth daily., Disp: 30 tablet, Rfl: 5   traZODone  (DESYREL ) 50 MG tablet, Take 25-50 mg by mouth at bedtime as needed for sleep., Disp: , Rfl:    triamterene -hydrochlorothiazide  (MAXZIDE -25) 37.5-25 MG tablet, Take 1 tablet by mouth daily each morning for fluid retention., Disp: 90 tablet, Rfl: 1   Tiotropium Bromide -Olodaterol (STIOLTO RESPIMAT ) 2.5-2.5 MCG/ACT  AERS, Inhale 2 puffs into the lungs daily., Disp: 4 g, Rfl: 11   Subjective:   [name] GENDER: female DOB: 08/19/1945, MRN: [id number]  Chief Complaint  Patient presents with   Follow-up    No breathing problems.     HPI  Patient is a pleasant 78 year old female presenting for follow-up.  She reports that her exertional dyspnea is at baseline.  She has no new symptoms since her last visit in July  2024.  She was unable to get her PFTs and has not had any recent imaging.  She has not used her inhaler consistently.  While she is prescribed oxygen , she has not been using it regularly.  She does report using it at home but she does not have it with her on presentation today.  She follows closely with her primary care physician, Dr. Geralyn Knee.   Patient has a past medical history of restrictive lung disease secondary to kyphoscoliosis previously followed at [facility]. Patient has had exertional dyspnea for many years and was by [facility] pulmonology 2010-2023.    Spirometry from 2022 showed FVC of 0.89 L, FEV1 of 0.72 L, FEV1/FVC of 0.81. Spirometry from 2019 showed FVC of 1.37 L, FEV1 of 0.96 L, FEV1/FVC of 0.7 Spirometry from 2018 showed FVC of 1.2 L, FEV1 of 0.91 L, FEV1/FVC of 0.76 Spirometry from 2017 showed FVC of 1.44 L, FEV1 of 1 L, FEV1/FVC of 0.7   Patient reports that her symptoms had been ongoing and persistent but she is unable to identify a point in time when the symptoms worsened. She is maintained on Stiolto which she uses 1 puff twice daily and feels some improvement with it.  She does not have a rescue inhaler such as albuterol .  She reports a one-time episode in the past where she had to use prednisone  but denies any hospitalizations for respiratory symptoms.   Patient denies any cough, wheezing, chest pain, chest tightness, sputum production, palpitations, fevers, chills, night sweats, or weight loss.  She was previously highly active and was working out at the gym to lose weight but has not done so for a while now. Patient denies any history of asthma growing up and reports that this was a newer diagnosis.  When she was a young adult she did experience shortness of breath with exertion secondary to her kyphoscoliosis.   Patient is a lifelong non-smoker and denies any occupational exposures.  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  They previously had a cat but do  not currently have any pets.  There is a family members to have dogs and her symptoms do not get worse around those.  She has not had any birds.  Lives at home, since 1979, with no water leaks or mold reported.  Ancillary information including prior medications, full medical/surgical/family/social histories, and PFTs (when available) are listed below and have been reviewed.   Review of Systems  Constitutional:  Negative for chills, fever, malaise/fatigue and weight loss.  Respiratory:  Positive for shortness of breath. Negative for cough, hemoptysis, sputum production and wheezing.   Cardiovascular:  Negative for chest pain, leg swelling and PND.     Objective:   Vitals:   06/07/23 1554  BP: 110/60  Pulse: (!) 102  Temp: 97.7 F (36.5 C)  TempSrc: Temporal  SpO2: 95%  Weight: 150 lb 12.8 oz (68.4 kg)  Height: 5' 1.5" (1.562 m)   95% on RA BMI Readings from Last 3  Encounters:  06/07/23 28.03 kg/m  04/20/23 27.92 kg/m  02/16/23 26.58 kg/m   Wt Readings from Last 3 Encounters:  06/07/23 150 lb 12.8 oz (68.4 kg)  04/20/23 150 lb 3.2 oz (68.1 kg)  02/16/23 143 lb (64.9 kg)    Physical Exam Constitutional:      Appearance: Normal appearance. She is not ill-appearing.  Cardiovascular:     Rate and Rhythm: Normal rate and regular rhythm.     Pulses: Normal pulses.     Heart sounds: Normal heart sounds.  Pulmonary:     Effort: Pulmonary effort is normal.     Breath sounds: Normal breath sounds.  Abdominal:     Palpations: Abdomen is soft.  Musculoskeletal:        General: Deformity present.     Thoracic back: Scoliosis present.     Lumbar back: Scoliosis present.  Neurological:     General: No focal deficit present.     Mental Status: She is alert and oriented to person, place, and time. Mental status is at baseline.       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 Retta Caster   Hypertension    PONV (postoperative nausea and vomiting)    Scoliosis    Ulcer      Family History  Problem Relation Age of Onset   Stroke Mother    Cancer Father        unknown type   Cancer Brother        "blood cancer"   CVA Maternal Grandmother    Healthy Son    Breast cancer Neg Hx    Colon cancer Neg Hx    Mental illness Neg Hx      Past Surgical History:  Procedure Laterality Date   ABDOMINAL HYSTERECTOMY     ANTERIOR CERVICAL DECOMP/DISCECTOMY FUSION N/A 04/19/2017   Procedure: Anterior Cervical Decompression Fusion Cervical Three-Four, Cervical Four-Five, Cervical Five-Six;  Surgeon: Gearl Keens, MD;  Location: [facility] OR;  Service: Neurosurgery;  Laterality: N/A;  Anterior Cervical Decompression Fusion Cervical Three-Four, Cervical Four-Five, Cervical Five-Six   BREAST BIOPSY Right 11/14/2020   US  Bx, Venus Clip, Invasive Mammary Carcinoma   BREAST LUMPECTOMY Right 2022   With radiation   BREAST LUMPECTOMY WITH RADIOACTIVE SEED AND SENTINEL LYMPH NODE BIOPSY Right 12/27/2020   Procedure: RIGHT BREAST LUMPECTOMY WITH RADIOACTIVE SEED AND AXILLARY SENTINEL LYMPH NODE BIOPSY;  Surgeon: Enid Harry, MD;  Location: [facility] OR;  Service: General;  Laterality: Right;   CHOLECYSTECTOMY  1996   DILATION AND CURETTAGE OF UTERUS  1971   LAPAROSCOPIC REMOVAL OF MESENTERIC MASS     LUMBAR DISC SURGERY  1998   [facility]   PARTIAL HYSTERECTOMY  1981   prolapsed uterus   TUBAL LIGATION  1978    Social History   Socioeconomic History   Marital status: Married    Spouse name: Carlos Chesterfield"   Number of children: 2   Years of education: Not on file   Highest education level: 12th grade  Occupational History   Occupation:  retired  teacher  Tobacco Use   Smoking status: Never   Smokeless tobacco: Never  Vaping Use   Vaping status: Never Used  Substance and Sexual Activity   Alcohol use: No    Alcohol/week: 0.0 standard drinks of alcohol   Drug use: No   Sexual activity: Not Currently    Comment: not asked if sexually active  Other Topics Concern   Not on file  Social History Narrative   Right handed    Lives with husband   Had breast cancer surgery right side   Worked in the school system    Social Drivers of Health   Financial Resource Strain: Low Risk  (02/16/2023)   Overall Financial Resource Strain (CARDIA)    Difficulty of Paying Living Expenses: Not hard at all  Food Insecurity: No Food Insecurity (02/16/2023)   Hunger Vital Sign    Worried About Running Out of Food in the Last Year: Never true    Ran Out of Food in the Last Year: Never true  Transportation Needs: No Transportation Needs (02/16/2023)   PRAPARE - Administrator, Civil Service (Medical): No    Lack of Transportation (Non-Medical): No  Physical Activity: Inactive (02/16/2023)   Exercise Vital Sign    Days of Exercise per Week: 0 days    Minutes of Exercise per Session: 0 min  Stress: No Stress Concern Present (02/16/2023)   [employer] of Occupational Health - Occupational Stress Questionnaire    Feeling of Stress : Only a little  Recent Concern: Stress - Stress Concern Present (02/11/2023)   [employer] of Occupational Health - Occupational Stress Questionnaire    Feeling of Stress : To some extent  Social Connections: Moderately Integrated (02/16/2023)   Social Connection and Isolation Panel [NHANES]    Frequency of Communication with Friends and Family: More than three times a week    Frequency of Social Gatherings with Friends and Family: Once a week    Attends Religious Services: More than 4 times per year    Active Member of [employer] or Organizations: No    Attends Banker Meetings:  Never    Marital Status: Married  Catering manager Violence: Not At Risk (02/16/2023)   Humiliation, Afraid, Rape, and Kick questionnaire    Fear of Current or Ex-Partner: No    Emotionally Abused: No    Physically Abused: No    Sexually Abused: No     Allergies  Allergen Reactions   Advair Diskus [Fluticasone -Salmeterol] Other (See Comments)    Causes blisters in her mouth   Codeine     Unknown reaction   Pollen Extract Cough     CBC    Component Value Date/Time   WBC 10.6 (H) 04/20/2023 1447   RBC 4.22 04/20/2023 1447   HGB 13.4 04/20/2023 1447   HGB 12.4 11/20/2022 1324   HCT 40.4 04/20/2023 1447   PLT 225.0 04/20/2023 1447   PLT 201 11/20/2022 1324   MCV 95.7 04/20/2023 1447   MCH 31.6 11/20/2022 1324   MCHC 33.3 04/20/2023 1447   RDW 13.1 04/20/2023 1447   LYMPHSABS 2.1 04/20/2023 1447   MONOABS 0.8 04/20/2023 1447   EOSABS 0.8 (H) 04/20/2023 1447   BASOSABS 0.1 04/20/2023 1447    Pulmonary Functions Testing Results:     No data to display          Outpatient Medications Prior to Visit  Medication Sig Dispense Refill   albuterol  (VENTOLIN  HFA) 108 (90 Base) MCG/ACT  inhaler Inhale 2 puffs into the lungs every 6 (six) hours as needed for wheezing or shortness of breath. 8 g 2   anastrozole  (ARIMIDEX ) 1 MG tablet Take 1 tablet (1 mg total) by mouth daily. 30 tablet 11   busPIRone  (BUSPAR ) 10 MG tablet Take 1 tablet (10 mg total) by mouth 3 (three) times daily. 90 tablet 1   citalopram  (CELEXA ) 40 MG tablet Take 1 tablet (40 mg total) by mouth daily. 90 tablet 0   clonazePAM  (KLONOPIN ) 0.5 MG tablet Take 1 tablet (0.5 mg total) by mouth 2 (two) times daily. 60 tablet 5   lamoTRIgine  (LAMICTAL ) 25 MG tablet Take 1 tablet (25 mg total) by mouth daily after lunch. 30 tablet 1   potassium chloride  (KLOR-CON ) 10 MEQ tablet Take one tablet by mouth once daily. 90 tablet 1   pravastatin  (PRAVACHOL ) 20 MG tablet Take 1 tablet (20 mg total) by mouth daily. 30 tablet 1    QUEtiapine  (SEROQUEL ) 50 MG tablet Take 1 tablet (50 mg total) by mouth at bedtime. 30 tablet 5   risperiDONE  (RISPERDAL ) 0.5 MG tablet Take 1 tablet (0.5 mg total) by mouth daily. 30 tablet 5   traZODone  (DESYREL ) 50 MG tablet Take 25-50 mg by mouth at bedtime as needed for sleep.     triamterene -hydrochlorothiazide  (MAXZIDE -25) 37.5-25 MG tablet Take 1 tablet by mouth daily each morning for fluid retention. 90 tablet 1   Tiotropium Bromide -Olodaterol (STIOLTO RESPIMAT ) 2.5-2.5 MCG/ACT AERS Inhale 2 puffs into the lungs daily. 4 g 11   No facility-administered medications prior to visit.

## 2023-06-08 ENCOUNTER — Ambulatory Visit: Admitting: Professional Counselor

## 2023-06-08 ENCOUNTER — Other Ambulatory Visit: Payer: Self-pay | Admitting: Psychiatry

## 2023-06-08 DIAGNOSIS — F321 Major depressive disorder, single episode, moderate: Secondary | ICD-10-CM

## 2023-06-08 DIAGNOSIS — F411 Generalized anxiety disorder: Secondary | ICD-10-CM

## 2023-06-14 DIAGNOSIS — J449 Chronic obstructive pulmonary disease, unspecified: Secondary | ICD-10-CM | POA: Diagnosis not present

## 2023-06-15 ENCOUNTER — Ambulatory Visit: Admitting: Student in an Organized Health Care Education/Training Program

## 2023-06-15 DIAGNOSIS — J984 Other disorders of lung: Secondary | ICD-10-CM

## 2023-06-15 DIAGNOSIS — R0602 Shortness of breath: Secondary | ICD-10-CM | POA: Diagnosis not present

## 2023-06-15 LAB — PULMONARY FUNCTION TEST
DL/VA % pred: 93 %
DL/VA: 4.03 ml/min/mmHg/L
DLCO unc % pred: 62 %
DLCO unc: 9.79 ml/min/mmHg
FEF 25-75 Post: 1.39 L/s
FEF 25-75 Pre: 0.29 L/s
FEF2575-%Change-Post: 371 %
FEF2575-%Pred-Post: 104 %
FEF2575-%Pred-Pre: 22 %
FEV1-%Change-Post: 54 %
FEV1-%Pred-Post: 56 %
FEV1-%Pred-Pre: 36 %
FEV1-Post: 0.92 L
FEV1-Pre: 0.6 L
FEV1FVC-%Change-Post: 22 %
FEV1FVC-%Pred-Pre: 82 %
FEV6-%Change-Post: 25 %
FEV6-%Pred-Post: 59 %
FEV6-%Pred-Pre: 46 %
FEV6-Post: 1.22 L
FEV6-Pre: 0.97 L
FEV6FVC-%Pred-Post: 105 %
FEV6FVC-%Pred-Pre: 105 %
FVC-%Change-Post: 25 %
FVC-%Pred-Post: 55 %
FVC-%Pred-Pre: 44 %
FVC-Post: 1.22 L
FVC-Pre: 0.97 L
Post FEV1/FVC ratio: 75 %
Post FEV6/FVC ratio: 100 %
Pre FEV1/FVC ratio: 61 %
Pre FEV6/FVC Ratio: 100 %
RV % pred: 84 %
RV: 1.74 L
TLC % pred: 69 %
TLC: 2.99 L

## 2023-06-15 NOTE — Patient Instructions (Signed)
 Full PFT completed today with MIP/MEP

## 2023-06-15 NOTE — Progress Notes (Signed)
 Full PFT completed today with MIP/MEP

## 2023-06-17 ENCOUNTER — Ambulatory Visit: Admitting: Internal Medicine

## 2023-06-21 ENCOUNTER — Encounter: Payer: Self-pay | Admitting: Internal Medicine

## 2023-06-22 ENCOUNTER — Other Ambulatory Visit: Payer: Self-pay | Admitting: Psychiatry

## 2023-06-22 ENCOUNTER — Inpatient Hospital Stay: Attending: Adult Health

## 2023-06-22 ENCOUNTER — Ambulatory Visit: Payer: Self-pay | Admitting: *Deleted

## 2023-06-22 ENCOUNTER — Telehealth: Payer: Self-pay | Admitting: Internal Medicine

## 2023-06-22 ENCOUNTER — Other Ambulatory Visit: Payer: Self-pay | Admitting: Internal Medicine

## 2023-06-22 ENCOUNTER — Ambulatory Visit: Admitting: Adult Health

## 2023-06-22 DIAGNOSIS — F331 Major depressive disorder, recurrent, moderate: Secondary | ICD-10-CM

## 2023-06-22 DIAGNOSIS — Z79811 Long term (current) use of aromatase inhibitors: Secondary | ICD-10-CM | POA: Diagnosis not present

## 2023-06-22 DIAGNOSIS — C50411 Malignant neoplasm of upper-outer quadrant of right female breast: Secondary | ICD-10-CM | POA: Insufficient documentation

## 2023-06-22 DIAGNOSIS — Z170 Estrogen receptor positive status [ER+]: Secondary | ICD-10-CM | POA: Diagnosis not present

## 2023-06-22 LAB — CBC WITH DIFFERENTIAL (CANCER CENTER ONLY)
Abs Immature Granulocytes: 0.02 10*3/uL (ref 0.00–0.07)
Basophils Absolute: 0.1 10*3/uL (ref 0.0–0.1)
Basophils Relative: 1 %
Eosinophils Absolute: 0.4 10*3/uL (ref 0.0–0.5)
Eosinophils Relative: 5 %
HCT: 35.8 % — ABNORMAL LOW (ref 36.0–46.0)
Hemoglobin: 11.8 g/dL — ABNORMAL LOW (ref 12.0–15.0)
Immature Granulocytes: 0 %
Lymphocytes Relative: 24 %
Lymphs Abs: 1.8 10*3/uL (ref 0.7–4.0)
MCH: 32 pg (ref 26.0–34.0)
MCHC: 33 g/dL (ref 30.0–36.0)
MCV: 97 fL (ref 80.0–100.0)
Monocytes Absolute: 0.6 10*3/uL (ref 0.1–1.0)
Monocytes Relative: 9 %
Neutro Abs: 4.6 10*3/uL (ref 1.7–7.7)
Neutrophils Relative %: 61 %
Platelet Count: 194 10*3/uL (ref 150–400)
RBC: 3.69 MIL/uL — ABNORMAL LOW (ref 3.87–5.11)
RDW: 12.4 % (ref 11.5–15.5)
WBC Count: 7.5 10*3/uL (ref 4.0–10.5)
nRBC: 0 % (ref 0.0–0.2)

## 2023-06-22 LAB — CMP (CANCER CENTER ONLY)
ALT: 8 U/L (ref 0–44)
AST: 15 U/L (ref 15–41)
Albumin: 4.1 g/dL (ref 3.5–5.0)
Alkaline Phosphatase: 51 U/L (ref 38–126)
Anion gap: 4 — ABNORMAL LOW (ref 5–15)
BUN: 25 mg/dL — ABNORMAL HIGH (ref 8–23)
CO2: 38 mmol/L — ABNORMAL HIGH (ref 22–32)
Calcium: 9.6 mg/dL (ref 8.9–10.3)
Chloride: 101 mmol/L (ref 98–111)
Creatinine: 1.48 mg/dL — ABNORMAL HIGH (ref 0.44–1.00)
GFR, Estimated: 36 mL/min — ABNORMAL LOW (ref >60)
Glucose, Bld: 92 mg/dL (ref 70–99)
Potassium: 3.8 mmol/L (ref 3.5–5.1)
Sodium: 143 mmol/L (ref 135–145)
Total Bilirubin: 0.5 mg/dL (ref 0.0–1.2)
Total Protein: 6.7 g/dL (ref 6.5–8.1)

## 2023-06-22 NOTE — Telephone Encounter (Signed)
 Received CC'd lab results. Hgb and kidney function have decreased. It appears labs were drawn by Cancer center and sent to me. It does not appear that she has a f/u appt with oncology. Please call her and see how she is doing. Any acute problems?

## 2023-06-23 ENCOUNTER — Ambulatory Visit: Admitting: Internal Medicine

## 2023-06-23 ENCOUNTER — Telehealth: Payer: Self-pay | Admitting: Internal Medicine

## 2023-06-23 VITALS — BP 126/72 | HR 74 | Temp 98.0°F | Resp 16 | Ht 59.0 in | Wt 154.6 lb

## 2023-06-23 DIAGNOSIS — E538 Deficiency of other specified B group vitamins: Secondary | ICD-10-CM

## 2023-06-23 DIAGNOSIS — D649 Anemia, unspecified: Secondary | ICD-10-CM | POA: Diagnosis not present

## 2023-06-23 DIAGNOSIS — Z01818 Encounter for other preprocedural examination: Secondary | ICD-10-CM | POA: Insufficient documentation

## 2023-06-23 DIAGNOSIS — K2100 Gastro-esophageal reflux disease with esophagitis, without bleeding: Secondary | ICD-10-CM | POA: Diagnosis not present

## 2023-06-23 DIAGNOSIS — R944 Abnormal results of kidney function studies: Secondary | ICD-10-CM | POA: Diagnosis not present

## 2023-06-23 DIAGNOSIS — I10 Essential (primary) hypertension: Secondary | ICD-10-CM

## 2023-06-23 DIAGNOSIS — J4520 Mild intermittent asthma, uncomplicated: Secondary | ICD-10-CM

## 2023-06-23 DIAGNOSIS — C50411 Malignant neoplasm of upper-outer quadrant of right female breast: Secondary | ICD-10-CM

## 2023-06-23 DIAGNOSIS — Z170 Estrogen receptor positive status [ER+]: Secondary | ICD-10-CM

## 2023-06-23 DIAGNOSIS — F419 Anxiety disorder, unspecified: Secondary | ICD-10-CM

## 2023-06-23 DIAGNOSIS — J984 Other disorders of lung: Secondary | ICD-10-CM | POA: Diagnosis not present

## 2023-06-23 NOTE — Telephone Encounter (Signed)
 Pt coming in today

## 2023-06-23 NOTE — Telephone Encounter (Signed)
 Copied from CRM [id number]. Topic: General - Other >> Jun 23, 2023  1:36 PM Kita Perish H wrote: Reason for CRM: Patient states she missed call from office and appointment today and having surgery on her fingers Friday and once she's better from that she will reschedule.  [place] [phone]

## 2023-06-23 NOTE — Progress Notes (Signed)
 Subjective:    [name], female    DOB: [birth year], 78 y.o.   MRN: [id number]  Patient here for  Chief Complaint  Patient presents with   Medical Management of Chronic Issues    HPI Here for a work in appt. Received labs from the cancer center. GFR decreased. Hgb slightly decreased. She is planning surgery - hand surgery - in a couple of days. States she is eating. No nausea or vomiting. No bowel change. Specifically denies any diarrhea. No abdominal pain. No chest pain. Breathing stable. No increased cough or congestion.    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 Retta Caster   Hypertension    PONV (postoperative nausea and vomiting)    Scoliosis    Ulcer    Past Surgical History:  Procedure Laterality Date   ABDOMINAL HYSTERECTOMY     ANTERIOR CERVICAL DECOMP/DISCECTOMY FUSION N/A 04/19/2017   Procedure: Anterior Cervical Decompression Fusion Cervical Three-Four, Cervical Four-Five, Cervical Five-Six;  Surgeon: Gearl Keens, MD;  Location: [facility] OR;  Service: Neurosurgery;  Laterality: N/A;  Anterior Cervical Decompression Fusion Cervical Three-Four, Cervical Four-Five, Cervical Five-Six   BREAST BIOPSY Right 11/14/2020   US  Bx, Venus Clip, Invasive Mammary Carcinoma   BREAST LUMPECTOMY Right 2022   With radiation   BREAST LUMPECTOMY WITH RADIOACTIVE SEED AND SENTINEL LYMPH NODE BIOPSY Right 12/27/2020   Procedure: RIGHT BREAST LUMPECTOMY WITH RADIOACTIVE SEED AND AXILLARY SENTINEL LYMPH NODE BIOPSY;  Surgeon: Enid Harry, MD;  Location: [facility] OR;  Service: General;  Laterality: Right;   CHOLECYSTECTOMY  1996   DILATION AND CURETTAGE OF UTERUS   1971   LAPAROSCOPIC REMOVAL OF MESENTERIC MASS     LUMBAR DISC SURGERY  1998   [facility]   PARTIAL HYSTERECTOMY  1981   prolapsed uterus   TUBAL LIGATION  1978   Family History  Problem Relation Age of Onset   Stroke Mother    Cancer Father        unknown type   Cancer Brother        "blood cancer"   CVA Maternal Grandmother    Healthy Son    Breast cancer Neg Hx    Colon cancer Neg Hx    Mental illness Neg Hx    Social History   Socioeconomic History   Marital status: Married    Spouse name: Galvin Jules "[name]"   Number of children: 2   Years of education: Not on file   Highest education level: 12th grade  Occupational History   Occupation: retired Runner, broadcasting/film/video  Tobacco Use   Smoking status: Never   Smokeless tobacco: Never  Vaping Use   Vaping status: Never Used  Substance and Sexual Activity   Alcohol use: No    Alcohol/week: 0.0 standard drinks of alcohol   Drug use: No   Sexual activity: Not Currently    Comment: not asked if sexually active  Other Topics Concern   Not on file  Social History Narrative   Right handed    Lives with husband   Had breast cancer surgery right side  Worked in the school system    Social Drivers of Health   Financial Resource Strain: Low Risk  (02/16/2023)   Overall Financial Resource Strain (CARDIA)    Difficulty of Paying Living Expenses: Not hard at all  Food Insecurity: No Food Insecurity (02/16/2023)   Hunger Vital Sign    Worried About Running Out of Food in the Last Year: Never true    Ran Out of Food in the Last Year: Never true  Transportation Needs: No Transportation Needs (02/16/2023)   PRAPARE - Administrator, Civil Service (Medical): No    Lack of Transportation (Non-Medical): No  Physical Activity: Inactive (02/16/2023)   Exercise Vital Sign    Days of Exercise per Week: 0 days    Minutes of Exercise per Session: 0 min  Stress: No Stress Concern Present (02/16/2023)   [employer] of Occupational Health -  Occupational Stress Questionnaire    Feeling of Stress : Only a little  Recent Concern: Stress - Stress Concern Present (02/11/2023)   [employer] of Occupational Health - Occupational Stress Questionnaire    Feeling of Stress : To some extent  Social Connections: Moderately Integrated (02/16/2023)   Social Connection and Isolation Panel [NHANES]    Frequency of Communication with Friends and Family: More than three times a week    Frequency of Social Gatherings with Friends and Family: Once a week    Attends Religious Services: More than 4 times per year    Active Member of [employer] or Organizations: No    Attends Banker Meetings: Never    Marital Status: Married     Review of Systems  Constitutional:  Negative for appetite change and unexpected weight change.  HENT:  Negative for congestion and sinus pressure.   Respiratory:  Negative for cough, chest tightness and shortness of breath.   Cardiovascular:  Negative for chest pain, palpitations and leg swelling.  Gastrointestinal:  Negative for abdominal pain, diarrhea, nausea and vomiting.  Genitourinary:  Negative for difficulty urinating and dysuria.  Musculoskeletal:  Negative for myalgias.       Pain hand - fingers - trigger.   Skin:  Negative for color change and rash.  Neurological:  Negative for dizziness and headaches.  Psychiatric/Behavioral:         Increased anxiety.        Objective:     BP 126/72   Pulse 74   Temp 98 F (36.7 C)   Resp 16   Ht 4\' 11"  (1.499 m)   Wt 154 lb 9.6 oz (70.1 kg)   SpO2 98%   BMI 31.23 kg/m  Wt Readings from Last 3 Encounters:  06/23/23 154 lb 9.6 oz (70.1 kg)  06/15/23 151 lb (68.5 kg)  06/07/23 150 lb 12.8 oz (68.4 kg)    Physical Exam Vitals reviewed.  Constitutional:      General: She is not in acute distress.    Appearance: Normal appearance.  HENT:     Head: Normocephalic and atraumatic.     Right Ear: External ear normal.     Left Ear: External ear  normal.     Mouth/Throat:     Pharynx: No oropharyngeal exudate or posterior oropharyngeal erythema.  Eyes:     General: No scleral icterus.       Right eye: No discharge.        Left eye: No discharge.     Conjunctiva/sclera: Conjunctivae normal.  Neck:     Thyroid : No  thyromegaly.  Cardiovascular:     Rate and Rhythm: Normal rate and regular rhythm.  Pulmonary:     Effort: No respiratory distress.     Breath sounds: Normal breath sounds. No wheezing.  Abdominal:     General: Bowel sounds are normal.     Palpations: Abdomen is soft.     Tenderness: There is no abdominal tenderness.  Musculoskeletal:        General: No swelling.     Cervical back: Neck supple. No tenderness.     Comments: Trigger fingers.   Lymphadenopathy:     Cervical: No cervical adenopathy.  Skin:    Findings: No erythema or rash.  Neurological:     Mental Status: She is alert.  Psychiatric:        Mood and Affect: Mood normal.        Behavior: Behavior normal.         Outpatient Encounter Medications as of 06/23/2023  Medication Sig   albuterol  (VENTOLIN  HFA) 108 (90 Base) MCG/ACT inhaler Inhale 2 puffs into the lungs every 6 (six) hours as needed for wheezing or shortness of breath.   anastrozole  (ARIMIDEX ) 1 MG tablet Take 1 tablet (1 mg total) by mouth daily.   busPIRone  (BUSPAR ) 10 MG tablet Take 1 tablet (10 mg total) by mouth 3 (three) times daily.   citalopram  (CELEXA ) 40 MG tablet Take 1 tablet (40 mg total) by mouth daily.   clonazePAM  (KLONOPIN ) 0.5 MG tablet Take 1 tablet (0.5 mg total) by mouth 2 (two) times daily.   lamoTRIgine  (LAMICTAL ) 25 MG tablet Take 1 tablet (25 mg total) by mouth daily after lunch.   potassium chloride  (KLOR-CON ) 10 MEQ tablet Take one tablet by mouth once daily.   pravastatin  (PRAVACHOL ) 20 MG tablet Take 1 tablet (20 mg total) by mouth daily.   QUEtiapine  (SEROQUEL ) 50 MG tablet Take 1 tablet (50 mg total) by mouth at bedtime.   risperiDONE  (RISPERDAL ) 0.5 MG  tablet Take 1 tablet (0.5 mg total) by mouth daily.   Tiotropium Bromide -Olodaterol (STIOLTO RESPIMAT ) 2.5-2.5 MCG/ACT AERS Inhale 2 puffs into the lungs daily.   traZODone  (DESYREL ) 50 MG tablet Take 25-50 mg by mouth at bedtime as needed for sleep.   [DISCONTINUED] triamterene -hydrochlorothiazide  (MAXZIDE -25) 37.5-25 MG tablet Take 1 tablet by mouth daily each morning for fluid retention.   No facility-administered encounter medications on file as of 06/23/2023.     Lab Results  Component Value Date   WBC 8.1 06/23/2023   HGB 12.0 06/23/2023   HCT 35.6 (L) 06/23/2023   PLT 205.0 06/23/2023   GLUCOSE 117 (H) 06/23/2023   CHOL 180 02/08/2023   TRIG 73.0 02/08/2023   HDL 79.30 02/08/2023   LDLDIRECT 148.4 11/03/2012   LDLCALC 86 02/08/2023   ALT 8 06/22/2023   AST 15 06/22/2023   NA 143 06/23/2023   K 3.4 (L) 06/23/2023   CL 97 06/23/2023   CREATININE 1.50 (H) 06/23/2023   BUN 20 06/23/2023   CO2 32 06/23/2023   TSH 1.04 04/20/2023   HGBA1C 5.7 02/08/2023    MM 3D DIAGNOSTIC MAMMOGRAM BILATERAL BREAST Result Date: 05/10/2023 CLINICAL DATA:  Status post RIGHT lumpectomy and radiation in December 2022. EXAM: DIGITAL DIAGNOSTIC BILATERAL MAMMOGRAM WITH TOMOSYNTHESIS AND CAD TECHNIQUE: Bilateral digital diagnostic mammography and breast tomosynthesis was performed. The images were evaluated with computer-aided detection. COMPARISON:  Previous exam(s). ACR Breast Density Category b: There are scattered areas of fibroglandular density. FINDINGS: There is density and architectural distortion within  the RIGHT breast, consistent with postsurgical changes. These are stable in comparison to prior. No suspicious mass, distortion, or microcalcifications are identified to suggest presence of malignancy. IMPRESSION: No mammographic evidence of malignancy bilaterally. RECOMMENDATION: Per protocol, as the patient is now 2 or more years status post lumpectomy, she may return to annual screening  mammography in 1 year. However, given the history of breast cancer, the patient remains eligible for annual diagnostic mammography if preferred. (Code:SM-B-01Y) I have discussed the findings and recommendations with the patient. If applicable, a reminder letter will be sent to the patient regarding the next appointment. BI-RADS CATEGORY  2: Benign. Electronically Signed   By: Clancy Crimes M.D.   On: 05/10/2023 14:23       Assessment & Plan:  Pre-op evaluation Assessment & Plan: Planning for hand surgery - regional block. Breathing stable. EKG - SR with no acute ischemic changes. Will recheck labs. Recent labs - decreased GFR - unclear etiology. Recheck cbc and iron studies as well as metabolic panel today. Discussed postponing surgery until can sort through this acute change in renal function.   Orders: -     EKG 12-Lead  Anemia, unspecified type -     CBC with Differential/Platelet -     Basic metabolic panel with GFR -     Vitamin B12 -     IBC + Ferritin  Anxiety Assessment & Plan: Increased anxiety. Continue f/u with psychiatry.    Mild intermittent asthma without complication Assessment & Plan: Continue stiolto.  Has rescue inhaler if needed.  Breathing stable. No acute pulmonary issues. Surgery planned with regional block.    B12 deficiency Assessment & Plan: Recheck b12 level today.   Chronic restrictive lung disease Assessment & Plan: Has been evaluated by pulmonary. Breathing stable.    Essential hypertension, benign Assessment & Plan: Blood pressure as outlined.  On triam/hctz.  Follow pressures.  Follow metabolic panel. No changes today.    Gastroesophageal reflux disease with esophagitis without hemorrhage Assessment & Plan: No upper symptoms reported.  Continue PPI.    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female, estrogen receptor positive (HCC) Assessment & Plan: Followed by oncology for f/u breast cancer - last seen  11/20/22. Transitioned from tamoxifen  to anastrozole . Need to confirm if taking.  Followed by oncology.  Mammogram 05/10/23 - Birads II.    Decreased GFR Assessment & Plan: Stay hydrated. Not taking anitinflammatory medication. Recheck met b. Follow. Further w/up pending results.       Dellar Fenton, MD

## 2023-06-23 NOTE — Telephone Encounter (Signed)
 Pt scheduled to see Dr Geralyn Knee today.

## 2023-06-24 ENCOUNTER — Ambulatory Visit: Payer: Self-pay | Admitting: Internal Medicine

## 2023-06-24 ENCOUNTER — Telehealth: Payer: Self-pay

## 2023-06-24 LAB — CBC WITH DIFFERENTIAL/PLATELET
Basophils Absolute: 0.1 10*3/uL (ref 0.0–0.1)
Basophils Relative: 0.9 % (ref 0.0–3.0)
Eosinophils Absolute: 0.4 10*3/uL (ref 0.0–0.7)
Eosinophils Relative: 5.4 % — ABNORMAL HIGH (ref 0.0–5.0)
HCT: 35.6 % — ABNORMAL LOW (ref 36.0–46.0)
Hemoglobin: 12 g/dL (ref 12.0–15.0)
Lymphocytes Relative: 17 % (ref 12.0–46.0)
Lymphs Abs: 1.4 10*3/uL (ref 0.7–4.0)
MCHC: 33.7 g/dL (ref 30.0–36.0)
MCV: 95.8 fl (ref 78.0–100.0)
Monocytes Absolute: 0.5 10*3/uL (ref 0.1–1.0)
Monocytes Relative: 6.2 % (ref 3.0–12.0)
Neutro Abs: 5.7 10*3/uL (ref 1.4–7.7)
Neutrophils Relative %: 70.5 % (ref 43.0–77.0)
Platelets: 205 10*3/uL (ref 150.0–400.0)
RBC: 3.71 Mil/uL — ABNORMAL LOW (ref 3.87–5.11)
RDW: 13.1 % (ref 11.5–15.5)
WBC: 8.1 10*3/uL (ref 4.0–10.5)

## 2023-06-24 LAB — IBC + FERRITIN
Ferritin: 78.9 ng/mL (ref 10.0–291.0)
Iron: 63 ug/dL (ref 42–145)
Saturation Ratios: 18.8 % — ABNORMAL LOW (ref 20.0–50.0)
TIBC: 334.6 ug/dL (ref 250.0–450.0)
Transferrin: 239 mg/dL (ref 212.0–360.0)

## 2023-06-24 LAB — BASIC METABOLIC PANEL WITH GFR
BUN: 20 mg/dL (ref 6–23)
CO2: 32 meq/L (ref 19–32)
Calcium: 9.2 mg/dL (ref 8.4–10.5)
Chloride: 97 meq/L (ref 96–112)
Creatinine, Ser: 1.5 mg/dL — ABNORMAL HIGH (ref 0.40–1.20)
GFR: 33.66 mL/min — ABNORMAL LOW (ref >60.00)
Glucose, Bld: 117 mg/dL — ABNORMAL HIGH (ref 70–99)
Potassium: 3.4 meq/L — ABNORMAL LOW (ref 3.5–5.1)
Sodium: 143 meq/L (ref 135–145)

## 2023-06-24 LAB — VITAMIN B12: Vitamin B-12: 220 pg/mL (ref 211–911)

## 2023-06-24 NOTE — Telephone Encounter (Signed)
 Copied from CRM [id number]. Topic: General - Other >> Jun 24, 2023 11:49 AM Emylou G wrote: Reason for CRM: Pls call patient.. she wants to have her surgery tomorrow.Aaron Aas and was told something different?  She is confused?

## 2023-06-24 NOTE — Telephone Encounter (Signed)
 Called and discussed with daughter to let her know what is going on about labs and surgery. Advised we are unsure if surgery will still be done tomorrow or not. Will review labs, discuss with emerge and then let patient know how we are going to proceed. Daughter was agreeable with this plan and says she will update her mom and dad.

## 2023-06-24 NOTE — Telephone Encounter (Signed)
 Copied from CRM [id number]. Topic: General - Other >> Jun 24, 2023 10:25 AM Adonis Hoot wrote: Reason for CRM: Patient is requesting a phone call regarding her lab results.She stated that she is suppose to have surgery tomorrow and the doctor from [place] called her saying something about her labs. She would ike to know what's going on.

## 2023-06-24 NOTE — Telephone Encounter (Signed)
 See other note

## 2023-06-27 ENCOUNTER — Encounter: Payer: Self-pay | Admitting: Internal Medicine

## 2023-06-27 DIAGNOSIS — R944 Abnormal results of kidney function studies: Secondary | ICD-10-CM | POA: Insufficient documentation

## 2023-06-27 NOTE — Assessment & Plan Note (Addendum)
 Continue stiolto.  Has rescue inhaler if needed.  Breathing stable. No acute pulmonary issues. Surgery planned with regional block.

## 2023-06-27 NOTE — Assessment & Plan Note (Signed)
 Blood pressure as outlined.  On triam/hctz.  Follow pressures.  Follow metabolic panel. No changes today.

## 2023-06-27 NOTE — Assessment & Plan Note (Signed)
 Followed by oncology for f/u breast cancer - last seen 11/20/22. Transitioned from tamoxifen  to anastrozole . Need to confirm if taking.  Followed by oncology.  Mammogram 05/10/23 - Birads II.

## 2023-06-27 NOTE — Assessment & Plan Note (Signed)
No upper symptoms reported.  Continue PPI.  ?

## 2023-06-27 NOTE — Assessment & Plan Note (Signed)
Recheck b12 level today.

## 2023-06-27 NOTE — Assessment & Plan Note (Signed)
 Increased anxiety. Continue f/u with psychiatry.

## 2023-06-27 NOTE — Assessment & Plan Note (Signed)
 Stay hydrated. Not taking anitinflammatory medication. Recheck met b. Follow. Further w/up pending results.

## 2023-06-27 NOTE — Assessment & Plan Note (Signed)
Has been evaluated by pulmonary.  Breathing stable.  

## 2023-06-27 NOTE — Assessment & Plan Note (Signed)
 Planning for hand surgery - regional block. Breathing stable. EKG - SR with no acute ischemic changes. Will recheck labs. Recent labs - decreased GFR - unclear etiology. Recheck cbc and iron studies as well as metabolic panel today. Discussed postponing surgery until can sort through this acute change in renal function.

## 2023-06-28 ENCOUNTER — Other Ambulatory Visit: Payer: Self-pay | Admitting: *Deleted

## 2023-06-28 DIAGNOSIS — D649 Anemia, unspecified: Secondary | ICD-10-CM

## 2023-06-29 ENCOUNTER — Ambulatory Visit
Admission: RE | Admit: 2023-06-29 | Discharge: 2023-06-29 | Disposition: A | Discharge status: Home / Self Care | Source: Ambulatory Visit | Attending: Internal Medicine | Admitting: Internal Medicine

## 2023-06-29 DIAGNOSIS — M419 Scoliosis, unspecified: Secondary | ICD-10-CM | POA: Diagnosis not present

## 2023-06-29 DIAGNOSIS — R0602 Shortness of breath: Secondary | ICD-10-CM | POA: Diagnosis not present

## 2023-06-29 DIAGNOSIS — J984 Other disorders of lung: Secondary | ICD-10-CM | POA: Diagnosis not present

## 2023-06-29 DIAGNOSIS — E041 Nontoxic single thyroid nodule: Secondary | ICD-10-CM | POA: Diagnosis not present

## 2023-06-30 ENCOUNTER — Other Ambulatory Visit: Payer: Self-pay | Admitting: Internal Medicine

## 2023-07-05 ENCOUNTER — Ambulatory Visit

## 2023-07-07 ENCOUNTER — Telehealth: Payer: Self-pay | Admitting: Internal Medicine

## 2023-07-07 NOTE — Telephone Encounter (Signed)
 See last lab result note. She was supposed to come in 07/05/23 - for lab and B12 injection. Please reschedule.  Thanks.  (May need to talk to her husband or her daughter).

## 2023-07-07 NOTE — Telephone Encounter (Unsigned)
 Copied from CRM [id number]. Topic: General - Call Back - No Documentation >> Jul 06, 2023 12:14 PM Antonieta Kitten wrote: Reason for CRM: Returning miss call to office, no voicemail left. Please call mobile phone [phone]

## 2023-07-07 NOTE — Telephone Encounter (Signed)
 Called husband. Unable to come this week. Scheduled for 6/23. Advised husband that this appt needs to be kept so we can recheck labs and get her weekly b12 injections started.

## 2023-07-12 ENCOUNTER — Ambulatory Visit (INDEPENDENT_AMBULATORY_CARE_PROVIDER_SITE_OTHER)

## 2023-07-12 DIAGNOSIS — D649 Anemia, unspecified: Secondary | ICD-10-CM | POA: Diagnosis not present

## 2023-07-12 DIAGNOSIS — E538 Deficiency of other specified B group vitamins: Secondary | ICD-10-CM

## 2023-07-12 LAB — BASIC METABOLIC PANEL WITH GFR
BUN: 17 mg/dL (ref 6–23)
CO2: 33 meq/L — ABNORMAL HIGH (ref 19–32)
Calcium: 9.4 mg/dL (ref 8.4–10.5)
Chloride: 100 meq/L (ref 96–112)
Creatinine, Ser: 0.89 mg/dL (ref 0.40–1.20)
GFR: 62.96 mL/min (ref >60.00)
Glucose, Bld: 95 mg/dL (ref 70–99)
Potassium: 3.9 meq/L (ref 3.5–5.1)
Sodium: 139 meq/L (ref 135–145)

## 2023-07-12 MED ORDER — CYANOCOBALAMIN 1000 MCG/ML IJ SOLN
1000.0000 ug | Freq: Once | INTRAMUSCULAR | Status: AC
  Administered 2023-07-12: 1000 ug via INTRAMUSCULAR

## 2023-07-12 NOTE — Progress Notes (Signed)
 Patient presented for B 12 injection to left deltoid, patient voiced no concerns nor showed any signs of distress during injection.

## 2023-07-13 ENCOUNTER — Ambulatory Visit: Payer: Self-pay | Admitting: Student in an Organized Health Care Education/Training Program

## 2023-07-13 ENCOUNTER — Ambulatory Visit: Payer: Self-pay | Admitting: Internal Medicine

## 2023-07-13 DIAGNOSIS — R0602 Shortness of breath: Secondary | ICD-10-CM

## 2023-07-15 DIAGNOSIS — J449 Chronic obstructive pulmonary disease, unspecified: Secondary | ICD-10-CM | POA: Diagnosis not present

## 2023-07-28 ENCOUNTER — Other Ambulatory Visit: Payer: Self-pay | Admitting: Psychiatry

## 2023-07-28 ENCOUNTER — Ambulatory Visit: Admitting: Internal Medicine

## 2023-07-28 VITALS — BP 118/70 | HR 84 | Temp 98.0°F | Resp 16 | Ht 59.0 in | Wt 153.4 lb

## 2023-07-28 DIAGNOSIS — R944 Abnormal results of kidney function studies: Secondary | ICD-10-CM

## 2023-07-28 DIAGNOSIS — E7800 Pure hypercholesterolemia, unspecified: Secondary | ICD-10-CM

## 2023-07-28 DIAGNOSIS — J984 Other disorders of lung: Secondary | ICD-10-CM

## 2023-07-28 DIAGNOSIS — I10 Essential (primary) hypertension: Secondary | ICD-10-CM | POA: Diagnosis not present

## 2023-07-28 DIAGNOSIS — Z01818 Encounter for other preprocedural examination: Secondary | ICD-10-CM

## 2023-07-28 DIAGNOSIS — M65341 Trigger finger, right ring finger: Secondary | ICD-10-CM

## 2023-07-28 DIAGNOSIS — R739 Hyperglycemia, unspecified: Secondary | ICD-10-CM | POA: Diagnosis not present

## 2023-07-28 DIAGNOSIS — F3342 Major depressive disorder, recurrent, in full remission: Secondary | ICD-10-CM

## 2023-07-28 DIAGNOSIS — F411 Generalized anxiety disorder: Secondary | ICD-10-CM

## 2023-07-28 DIAGNOSIS — F419 Anxiety disorder, unspecified: Secondary | ICD-10-CM

## 2023-07-28 LAB — HEPATIC FUNCTION PANEL
ALT: 10 U/L (ref 0–35)
AST: 19 U/L (ref 0–37)
Albumin: 4.4 g/dL (ref 3.5–5.2)
Alkaline Phosphatase: 57 U/L (ref 39–117)
Bilirubin, Direct: 0.1 mg/dL (ref 0.0–0.3)
Total Bilirubin: 0.7 mg/dL (ref 0.2–1.2)
Total Protein: 7.1 g/dL (ref 6.0–8.3)

## 2023-07-28 LAB — LIPID PANEL
Cholesterol: 190 mg/dL (ref 0–200)
HDL: 91.3 mg/dL (ref >39.00)
LDL Cholesterol: 87 mg/dL (ref 0–99)
NonHDL: 98.52
Total CHOL/HDL Ratio: 2
Triglycerides: 58 mg/dL (ref 0.0–149.0)
VLDL: 11.6 mg/dL (ref 0.0–40.0)

## 2023-07-28 LAB — BASIC METABOLIC PANEL WITH GFR
BUN: 16 mg/dL (ref 6–23)
CO2: 37 meq/L — ABNORMAL HIGH (ref 19–32)
Calcium: 9.6 mg/dL (ref 8.4–10.5)
Chloride: 98 meq/L (ref 96–112)
Creatinine, Ser: 1.35 mg/dL — ABNORMAL HIGH (ref 0.40–1.20)
GFR: 38.18 mL/min — ABNORMAL LOW (ref >60.00)
Glucose, Bld: 89 mg/dL (ref 70–99)
Potassium: 3.8 meq/L (ref 3.5–5.1)
Sodium: 141 meq/L (ref 135–145)

## 2023-07-28 LAB — HEMOGLOBIN A1C: Hgb A1c MFr Bld: 5.5 % (ref 4.6–6.5)

## 2023-07-28 NOTE — Progress Notes (Signed)
 Subjective:    [name], female    DOB: November 09, 1945, 78 y.o.   MRN: [id number]  Patient here for  Chief Complaint  Patient presents with   Medical Management of Chronic Issues    HPI Here for a scheduled follow up - follow up regarding increased anxiety and hypertension. She is accompanied by her husband. History obtained from both of them. Was recenlty evaluated for pre op evaluation. Surgery postponed due to renal issues. Kidney function improved. Followed by psychiatry. Overall feels thing s are stable. Sees Dr Isadora for f/u restrictive lung disease. Started stiolto. PFTs. - improvement after albuterol . CT - no evidence of ILD. She reports her breathing is overall stable. No increased sob. No chest pain. No abdominal pain or bowel change reported.    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 Lynwood Nasuti   Hypertension    PONV (postoperative nausea and vomiting)    Scoliosis    Ulcer    Past Surgical History:  Procedure Laterality Date   ABDOMINAL HYSTERECTOMY     ANTERIOR CERVICAL DECOMP/DISCECTOMY FUSION N/A 04/19/2017   Procedure: Anterior Cervical Decompression Fusion Cervical Three-Four, Cervical Four-Five, Cervical Five-Six;  Surgeon: Onetha Kuba, MD;  Location: [facility] OR;  Service: Neurosurgery;  Laterality: N/A;  Anterior Cervical Decompression Fusion Cervical Three-Four, Cervical Four-Five, Cervical Five-Six   BREAST BIOPSY Right 11/14/2020   US  Bx, Venus Clip, Invasive Mammary Carcinoma   BREAST LUMPECTOMY Right 2022   With radiation   BREAST LUMPECTOMY WITH RADIOACTIVE SEED AND SENTINEL LYMPH NODE BIOPSY Right 12/27/2020   Procedure: RIGHT  BREAST LUMPECTOMY WITH RADIOACTIVE SEED AND AXILLARY SENTINEL LYMPH NODE BIOPSY;  Surgeon: Ebbie Cough, MD;  Location: [facility] OR;  Service: General;  Laterality: Right;   CHOLECYSTECTOMY  1996   DILATION AND CURETTAGE OF UTERUS  1971   LAPAROSCOPIC REMOVAL OF MESENTERIC MASS     LUMBAR DISC SURGERY  1998   [facility]   PARTIAL HYSTERECTOMY  1981   prolapsed uterus   TUBAL LIGATION  1978   Family History  Problem Relation Age of Onset   Stroke Mother    Cancer Father        unknown type   Cancer Brother        blood cancer   CVA Maternal Grandmother    Healthy Son    Breast cancer Neg Hx    Colon cancer Neg Hx    Mental illness Neg Hx    Social History   Socioeconomic History   Marital status: Married    Spouse name: Ellender Burden   Number of children: 2   Years of education: Not on file   Highest education level: 12th grade  Occupational History   Occupation: retired Runner, broadcasting/film/video  Tobacco Use   Smoking status: Never   Smokeless tobacco: Never  Vaping Use   Vaping status: Never Used  Substance and Sexual Activity   Alcohol use: No    Alcohol/week: 0.0 standard drinks of alcohol   Drug use: No   Sexual activity: Not Currently    Comment: not asked  if sexually active  Other Topics Concern   Not on file  Social History Narrative   Right handed    Lives with husband   Had breast cancer surgery right side   Worked in the school system    Social Drivers of Health   Financial Resource Strain: Low Risk  (02/16/2023)   Overall Financial Resource Strain (CARDIA)    Difficulty of Paying Living Expenses: Not hard at all  Food Insecurity: No Food Insecurity (02/16/2023)   Hunger Vital Sign    Worried About Running Out of Food in the Last Year: Never true    Ran Out of Food in the Last Year: Never true  Transportation Needs: No Transportation Needs (02/16/2023)   PRAPARE - Administrator, Civil Service (Medical): No    Lack of Transportation (Non-Medical): No   Physical Activity: Inactive (02/16/2023)   Exercise Vital Sign    Days of Exercise per Week: 0 days    Minutes of Exercise per Session: 0 min  Stress: No Stress Concern Present (02/16/2023)   [employer] of Occupational Health - Occupational Stress Questionnaire    Feeling of Stress : Only a little  Recent Concern: Stress - Stress Concern Present (02/11/2023)   [employer] of Occupational Health - Occupational Stress Questionnaire    Feeling of Stress : To some extent  Social Connections: Moderately Integrated (02/16/2023)   Social Connection and Isolation Panel    Frequency of Communication with Friends and Family: More than three times a week    Frequency of Social Gatherings with Friends and Family: Once a week    Attends Religious Services: More than 4 times per year    Active Member of [employer] or Organizations: No    Attends Banker Meetings: Never    Marital Status: Married     Review of Systems  Constitutional:  Negative for appetite change and unexpected weight change.  HENT:  Negative for congestion and sinus pressure.   Respiratory:  Negative for cough and chest tightness.        Breathing stable.   Cardiovascular:  Negative for chest pain, palpitations and leg swelling.  Gastrointestinal:  Negative for abdominal pain, diarrhea, nausea and vomiting.  Genitourinary:  Negative for difficulty urinating and dysuria.  Musculoskeletal:  Negative for joint swelling and myalgias.  Skin:  Negative for color change and rash.  Neurological:  Negative for dizziness and headaches.  Psychiatric/Behavioral:  Negative for agitation and dysphoric mood.        Issues with anxiety - overall appears to be doing better - stable.        Objective:     BP 118/70   Pulse 84   Temp 98 F (36.7 C)   Resp 16   Ht 4' 11 (1.499 m)   Wt 153 lb 6.4 oz (69.6 kg)   SpO2 97%   BMI 30.98 kg/m  Wt Readings from Last 3 Encounters:  07/28/23 153 lb 6.4 oz (69.6 kg)   06/23/23 154 lb 9.6 oz (70.1 kg)  06/15/23 151 lb (68.5 kg)    Physical Exam Vitals reviewed.  Constitutional:      General: She is not in acute distress.    Appearance: Normal appearance.  HENT:     Head: Normocephalic and atraumatic.     Right Ear: External ear normal.     Left Ear: External ear normal.     Mouth/Throat:     Pharynx: No oropharyngeal exudate or posterior oropharyngeal  erythema.  Eyes:     General: No scleral icterus.       Right eye: No discharge.        Left eye: No discharge.     Conjunctiva/sclera: Conjunctivae normal.  Neck:     Thyroid : No thyromegaly.  Cardiovascular:     Rate and Rhythm: Normal rate and regular rhythm.  Pulmonary:     Effort: No respiratory distress.     Breath sounds: Normal breath sounds. No wheezing.  Abdominal:     General: Bowel sounds are normal.     Palpations: Abdomen is soft.     Tenderness: There is no abdominal tenderness.  Musculoskeletal:        General: No swelling or tenderness.     Cervical back: Neck supple. No tenderness.  Lymphadenopathy:     Cervical: No cervical adenopathy.  Skin:    Findings: No erythema or rash.  Neurological:     Mental Status: She is alert.  Psychiatric:        Mood and Affect: Mood normal.        Behavior: Behavior normal.         Outpatient Encounter Medications as of 07/28/2023  Medication Sig   albuterol  (VENTOLIN  HFA) 108 (90 Base) MCG/ACT inhaler Inhale 2 puffs into the lungs every 6 (six) hours as needed for wheezing or shortness of breath.   anastrozole  (ARIMIDEX ) 1 MG tablet Take 1 tablet (1 mg total) by mouth daily.   busPIRone  (BUSPAR ) 10 MG tablet Take 1 tablet (10 mg total) by mouth 3 (three) times daily.   clonazePAM  (KLONOPIN ) 0.5 MG tablet Take 1 tablet (0.5 mg total) by mouth 2 (two) times daily.   lamoTRIgine  (LAMICTAL ) 25 MG tablet Take 1 tablet (25 mg total) by mouth daily after lunch.   potassium chloride  (KLOR-CON ) 10 MEQ tablet Take one tablet by mouth  once daily.   pravastatin  (PRAVACHOL ) 20 MG tablet Take 1 tablet (20 mg total) by mouth daily.   QUEtiapine  (SEROQUEL ) 50 MG tablet Take 1 tablet (50 mg total) by mouth at bedtime.   risperiDONE  (RISPERDAL ) 0.5 MG tablet Take 1 tablet (0.5 mg total) by mouth daily.   traZODone  (DESYREL ) 50 MG tablet Take 25-50 mg by mouth at bedtime as needed for sleep.   triamterene -hydrochlorothiazide  (MAXZIDE -25) 37.5-25 MG tablet Take 1 tablet by mouth daily each morning for fluid retention.   [DISCONTINUED] citalopram  (CELEXA ) 40 MG tablet Take 1 tablet (40 mg total) by mouth daily.   No facility-administered encounter medications on file as of 07/28/2023.     Lab Results  Component Value Date   WBC 8.1 06/23/2023   HGB 12.0 06/23/2023   HCT 35.6 (L) 06/23/2023   PLT 205.0 06/23/2023   GLUCOSE 89 07/28/2023   CHOL 190 07/28/2023   TRIG 58.0 07/28/2023   HDL 91.30 07/28/2023   LDLDIRECT 148.4 11/03/2012   LDLCALC 87 07/28/2023   ALT 10 07/28/2023   AST 19 07/28/2023   NA 141 07/28/2023   K 3.8 07/28/2023   CL 98 07/28/2023   CREATININE 1.35 (H) 07/28/2023   BUN 16 07/28/2023   CO2 37 (H) 07/28/2023   TSH 1.04 04/20/2023   HGBA1C 5.5 07/28/2023    CT CHEST HIGH RESOLUTION Result Date: 07/07/2023 CLINICAL DATA:  Shortness of breath, restrictive lung disease. Breast cancer. EXAM: CT CHEST WITHOUT CONTRAST TECHNIQUE: Multidetector CT imaging of the chest was performed following the standard protocol without intravenous contrast. High resolution imaging of the lungs, as well  as inspiratory and expiratory imaging, was performed. RADIATION DOSE REDUCTION: This exam was performed according to the departmental dose-optimization program which includes automated exposure control, adjustment of the mA and/or kV according to patient size and/or use of iterative reconstruction technique. COMPARISON:  11/08/2014. FINDINGS: Cardiovascular: Atherosclerotic calcification of the aorta. Heart is at the upper limits  of normal in size. No pericardial effusion. Mediastinum/Nodes: 1.3 cm low-attenuation left thyroid  nodule. No follow-up recommended. (Ref: J Am Coll Radiol. 2015 Feb;12(2): 143-50).Scarring Lungs/Pleura: Negative for subpleural reticulation, traction bronchiectasis/bronchiolectasis, ground glass, architectural distortion or honeycombing. Scattered bibasilar scarring. Calcified granulomas. Ground-glass, central volume loss and mild architectural distortion in the left lower lobe. Upper Abdomen: Cholecystectomy. Visualized portions of the liver, adrenal glands, kidneys, spleen, pancreas, stomach and bowel are otherwise grossly unremarkable. No upper abdominal adenopathy. Musculoskeletal: Severe reverse S shaped scoliosis with associated distortion of the left ribs and left hemithorax. Degenerative changes in the spine. IMPRESSION: 1. No evidence of interstitial lung disease. 2. Ground-glass and central volume loss in the left lower lobe is of uncertain acuity but postinfectious/postinflammatory scarring is favored. 3.  Aortic atherosclerosis (ICD10-I70.0). Electronically Signed   By: Newell Eke M.D.   On: 07/07/2023 11:12       Assessment & Plan:  Hyperglycemia Assessment & Plan: Low carb ciet and exercise. Follow met b and A1c.   Orders: -     Basic metabolic panel with GFR -     Hemoglobin A1c  Hypercholesterolemia Assessment & Plan: On pravastatin .  Low cholesterol diet and exercise. Follow lipid panel.   Orders: -     Hepatic function panel -     Lipid panel  Anxiety Assessment & Plan: Followed by psychiatry. Overall stable on current regimen.    Chronic restrictive lung disease Assessment & Plan: Is followed by pulmonary. Breathing stable.    Decreased GFR Assessment & Plan: Noted on recent check.  F/u labs improved. Recheck metabolic panel today.    Essential hypertension, benign Assessment & Plan: Blood pressure as outlined.  On triam/hctz.  Follow pressures.  Recheck  metabolic panel today.    Trigger finger, right ring finger Assessment & Plan: Persistent. Now unable to straighten. Saw ortho. Recommended surgery as outlined. No chest pain reported. Breathing stable. Continues on stiolto. Recheck metabolic panel today to confirm kidney function ok. Will need to use inhaler in the pre/peri/post op period.    Pre-op evaluation Assessment & Plan: Planning for hand surgery - regional block. Breathing stable. Recent EKG - SR with no acute ischemic changes. Will recheck labs. Recent labs - decreased GFR - unclear etiology.  Stay hydrated.       Allena Hamilton, MD

## 2023-07-29 ENCOUNTER — Ambulatory Visit: Payer: Self-pay | Admitting: Internal Medicine

## 2023-07-29 DIAGNOSIS — R739 Hyperglycemia, unspecified: Secondary | ICD-10-CM

## 2023-07-30 MED ORDER — TRIAMTERENE-HCTZ 37.5-25 MG PO TABS: 0.5000 | ORAL_TABLET | Freq: Every day | ORAL | 1 refills | Status: AC

## 2023-07-30 NOTE — Telephone Encounter (Signed)
 I have signed the order for the new dose - 1/2 tablet triam/hydrochlorothiazide .  Let me know if there is anything more that I need to do.

## 2023-08-02 ENCOUNTER — Encounter: Payer: Self-pay | Admitting: Internal Medicine

## 2023-08-02 ENCOUNTER — Other Ambulatory Visit: Payer: Self-pay | Admitting: Internal Medicine

## 2023-08-02 NOTE — Assessment & Plan Note (Signed)
Followed by psychiatry.  Overall stable on current regimen.

## 2023-08-02 NOTE — Assessment & Plan Note (Signed)
 Blood pressure as outlined.  On triam/hctz.  Follow pressures.  Recheck metabolic panel today.

## 2023-08-02 NOTE — Assessment & Plan Note (Signed)
 Persistent. Now unable to straighten. Saw ortho. Recommended surgery as outlined. No chest pain reported. Breathing stable. Continues on stiolto. Recheck metabolic panel today to confirm kidney function ok. Will need to use inhaler in the pre/peri/post op period.

## 2023-08-02 NOTE — Assessment & Plan Note (Signed)
 Is followed by pulmonary. Breathing stable.

## 2023-08-02 NOTE — Assessment & Plan Note (Signed)
On pravastatin.  Low cholesterol diet and exercise.  Follow lipid panel.   

## 2023-08-02 NOTE — Assessment & Plan Note (Signed)
 Noted on recent check.  F/u labs improved. Recheck metabolic panel today.

## 2023-08-02 NOTE — Assessment & Plan Note (Signed)
 Planning for hand surgery - regional block. Breathing stable. Recent EKG - SR with no acute ischemic changes. Will recheck labs. Recent labs - decreased GFR - unclear etiology.  Stay hydrated.

## 2023-08-02 NOTE — Assessment & Plan Note (Signed)
 Low carb ciet and exercise. Follow met b and A1c.

## 2023-08-03 DIAGNOSIS — M65341 Trigger finger, right ring finger: Secondary | ICD-10-CM | POA: Diagnosis not present

## 2023-08-03 DIAGNOSIS — D519 Vitamin B12 deficiency anemia, unspecified: Secondary | ICD-10-CM | POA: Diagnosis not present

## 2023-08-03 NOTE — Progress Notes (Signed)
 Jennifer [name] - your breathing test shows a significant improvement in your ability to breath out after administration of albuterol . The number, however, is unchanged from prior. Have you felt better with the new inhaler we have prescribed you during your last visit?   Sincerely, Belva November, MD

## 2023-08-04 MED ORDER — STIOLTO RESPIMAT 2.5-2.5 MCG/ACT IN AERS: 2.0000 | INHALATION_SPRAY | Freq: Every day | RESPIRATORY_TRACT | 11 refills | Status: AC

## 2023-08-11 ENCOUNTER — Other Ambulatory Visit: Payer: Self-pay | Admitting: Internal Medicine

## 2023-08-11 ENCOUNTER — Ambulatory Visit

## 2023-08-11 DIAGNOSIS — E538 Deficiency of other specified B group vitamins: Secondary | ICD-10-CM | POA: Diagnosis not present

## 2023-08-11 MED ORDER — CYANOCOBALAMIN 1000 MCG/ML IJ SOLN
1000.0000 ug | Freq: Once | INTRAMUSCULAR | Status: AC
  Administered 2023-08-11: 1000 ug via INTRAMUSCULAR

## 2023-08-11 NOTE — Progress Notes (Signed)
 Patient was administered a B12 injection into her right deltoid. Patient tolerated the B12 injection well.

## 2023-08-16 ENCOUNTER — Ambulatory Visit: Admitting: Internal Medicine

## 2023-08-16 ENCOUNTER — Ambulatory Visit: Admitting: Psychiatry

## 2023-08-16 VITALS — BP 110/70 | HR 80 | Resp 16 | Ht 59.0 in | Wt 158.0 lb

## 2023-08-16 DIAGNOSIS — F419 Anxiety disorder, unspecified: Secondary | ICD-10-CM

## 2023-08-16 DIAGNOSIS — J984 Other disorders of lung: Secondary | ICD-10-CM | POA: Diagnosis not present

## 2023-08-16 DIAGNOSIS — R739 Hyperglycemia, unspecified: Secondary | ICD-10-CM | POA: Diagnosis not present

## 2023-08-16 DIAGNOSIS — R944 Abnormal results of kidney function studies: Secondary | ICD-10-CM | POA: Diagnosis not present

## 2023-08-16 DIAGNOSIS — Z170 Estrogen receptor positive status [ER+]: Secondary | ICD-10-CM

## 2023-08-16 DIAGNOSIS — K2100 Gastro-esophageal reflux disease with esophagitis, without bleeding: Secondary | ICD-10-CM

## 2023-08-16 DIAGNOSIS — E7800 Pure hypercholesterolemia, unspecified: Secondary | ICD-10-CM | POA: Diagnosis not present

## 2023-08-16 DIAGNOSIS — C50411 Malignant neoplasm of upper-outer quadrant of right female breast: Secondary | ICD-10-CM

## 2023-08-16 DIAGNOSIS — I10 Essential (primary) hypertension: Secondary | ICD-10-CM

## 2023-08-16 DIAGNOSIS — J4520 Mild intermittent asthma, uncomplicated: Secondary | ICD-10-CM

## 2023-08-16 DIAGNOSIS — Z01818 Encounter for other preprocedural examination: Secondary | ICD-10-CM

## 2023-08-16 LAB — CBC WITH DIFFERENTIAL/PLATELET
Basophils Absolute: 0.1 K/uL (ref 0.0–0.1)
Basophils Relative: 1 % (ref 0.0–3.0)
Eosinophils Absolute: 0.5 K/uL (ref 0.0–0.7)
Eosinophils Relative: 6.5 % — ABNORMAL HIGH (ref 0.0–5.0)
HCT: 36.5 % (ref 36.0–46.0)
Hemoglobin: 12.1 g/dL (ref 12.0–15.0)
Lymphocytes Relative: 24 % (ref 12.0–46.0)
Lymphs Abs: 1.9 K/uL (ref 0.7–4.0)
MCHC: 33.2 g/dL (ref 30.0–36.0)
MCV: 96.3 fl (ref 78.0–100.0)
Monocytes Absolute: 0.6 K/uL (ref 0.1–1.0)
Monocytes Relative: 6.9 % (ref 3.0–12.0)
Neutro Abs: 4.9 K/uL (ref 1.4–7.7)
Neutrophils Relative %: 61.6 % (ref 43.0–77.0)
Platelets: 207 K/uL (ref 150.0–400.0)
RBC: 3.79 Mil/uL — ABNORMAL LOW (ref 3.87–5.11)
RDW: 13.5 % (ref 11.5–15.5)
WBC: 8 K/uL (ref 4.0–10.5)

## 2023-08-16 LAB — BASIC METABOLIC PANEL WITH GFR
BUN: 17 mg/dL (ref 6–23)
CO2: 35 meq/L — ABNORMAL HIGH (ref 19–32)
Calcium: 9.4 mg/dL (ref 8.4–10.5)
Chloride: 99 meq/L (ref 96–112)
Creatinine, Ser: 0.9 mg/dL (ref 0.40–1.20)
GFR: 62.08 mL/min (ref >60.00)
Glucose, Bld: 84 mg/dL (ref 70–99)
Potassium: 3.9 meq/L (ref 3.5–5.1)
Sodium: 141 meq/L (ref 135–145)

## 2023-08-16 NOTE — Progress Notes (Signed)
 Subjective:    [name], female    DOB: [birth year], 78 y.o.   MRN: [id number]  Patient here for  Chief Complaint  Patient presents with   Medical Management of Chronic Issues    DISCUSS SURGERY    HPI Here for a scheduled follow up - here to discuss her ortho surgery.  She is accompanied by her husband. History obtained from both of them. Was recenlty evaluated for pre op evaluation. Surgery postponed due to renal issues.  Sees Dr Isadora for f/u restrictive lung disease. Started stiolto. PFTs. - improvement after albuterol . CT - no evidence of ILD. She reports her breathing is overall stable. No increased sob. No chest pain. No abdominal pain or bowel change reported.    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 Lynwood Nasuti   Hypertension    PONV (postoperative nausea and vomiting)    Scoliosis    Ulcer    Past Surgical History:  Procedure Laterality Date   ABDOMINAL HYSTERECTOMY     ANTERIOR CERVICAL DECOMP/DISCECTOMY FUSION N/A 04/19/2017   Procedure: Anterior Cervical Decompression Fusion Cervical Three-Four, Cervical Four-Five, Cervical Five-Six;  Surgeon: Onetha Kuba, MD;  Location: [facility] OR;  Service: Neurosurgery;  Laterality: N/A;  Anterior Cervical Decompression Fusion Cervical Three-Four, Cervical Four-Five, Cervical Five-Six   BREAST BIOPSY Right 11/14/2020   US  Bx, Venus Clip, Invasive Mammary Carcinoma   BREAST LUMPECTOMY Right 2022   With radiation   BREAST LUMPECTOMY WITH RADIOACTIVE SEED AND SENTINEL LYMPH NODE BIOPSY Right 12/27/2020   Procedure: RIGHT BREAST LUMPECTOMY WITH RADIOACTIVE SEED AND AXILLARY SENTINEL LYMPH NODE BIOPSY;   Surgeon: Ebbie Cough, MD;  Location: [facility] OR;  Service: General;  Laterality: Right;   CHOLECYSTECTOMY  1996   DILATION AND CURETTAGE OF UTERUS  1971   LAPAROSCOPIC REMOVAL OF MESENTERIC MASS     LUMBAR DISC SURGERY  1998   [facility]   PARTIAL HYSTERECTOMY  1981   prolapsed uterus   TUBAL LIGATION  1978   Family History  Problem Relation Age of Onset   Stroke Mother    Cancer Father        unknown type   Cancer Brother        blood cancer   CVA Maternal Grandmother    Healthy Son    Breast cancer Neg Hx    Colon cancer Neg Hx    Mental illness Neg Hx    Social History   Socioeconomic History   Marital status: Married    Spouse name: Ellender Burden   Number of children: 2   Years of education: Not on file   Highest education level: 12th grade  Occupational History   Occupation: retired Runner, broadcasting/film/video  Tobacco Use   Smoking status: Never   Smokeless tobacco: Never  Vaping Use   Vaping status: Never Used  Substance and Sexual Activity   Alcohol use: No    Alcohol/week: 0.0 standard drinks of alcohol   Drug use: No   Sexual activity: Not Currently    Comment: not asked if sexually active  Other Topics  Concern   Not on file  Social History Narrative   Right handed    Lives with husband   Had breast cancer surgery right side   Worked in the school system    Social Drivers of Health   Financial Resource Strain: Low Risk  (02/16/2023)   Overall Financial Resource Strain (CARDIA)    Difficulty of Paying Living Expenses: Not hard at all  Food Insecurity: No Food Insecurity (02/16/2023)   Hunger Vital Sign    Worried About Running Out of Food in the Last Year: Never true    Ran Out of Food in the Last Year: Never true  Transportation Needs: No Transportation Needs (02/16/2023)   PRAPARE - Administrator, Civil Service (Medical): No    Lack of Transportation (Non-Medical): No  Physical Activity: Inactive (02/16/2023)   Exercise Vital Sign    Days of Exercise  per Week: 0 days    Minutes of Exercise per Session: 0 min  Stress: No Stress Concern Present (02/16/2023)   [employer] of Occupational Health - Occupational Stress Questionnaire    Feeling of Stress : Only a little  Recent Concern: Stress - Stress Concern Present (02/11/2023)   [employer] of Occupational Health - Occupational Stress Questionnaire    Feeling of Stress : To some extent  Social Connections: Moderately Integrated (02/16/2023)   Social Connection and Isolation Panel    Frequency of Communication with Friends and Family: More than three times a week    Frequency of Social Gatherings with Friends and Family: Once a week    Attends Religious Services: More than 4 times per year    Active Member of [employer] or Organizations: No    Attends Banker Meetings: Never    Marital Status: Married     Review of Systems  Constitutional:  Negative for appetite change and unexpected weight change.  HENT:  Negative for congestion and sinus pressure.   Respiratory:  Negative for cough and chest tightness.        Breathing stable. No increased sob.   Cardiovascular:  Negative for chest pain, palpitations and leg swelling.  Gastrointestinal:  Negative for abdominal pain, diarrhea, nausea and vomiting.  Genitourinary:  Negative for difficulty urinating and dysuria.  Musculoskeletal:  Negative for joint swelling and myalgias.  Skin:  Negative for color change and rash.  Neurological:  Negative for dizziness and headaches.  Psychiatric/Behavioral:  Negative for agitation and dysphoric mood.        Objective:     BP 110/70   Pulse 80   Resp 16   Ht 4' 11 (1.499 m)   Wt 158 lb (71.7 kg)   SpO2 97%   BMI 31.91 kg/m  Wt Readings from Last 3 Encounters:  08/16/23 158 lb (71.7 kg)  07/28/23 153 lb 6.4 oz (69.6 kg)  06/23/23 154 lb 9.6 oz (70.1 kg)    Physical Exam Vitals reviewed.  Constitutional:      General: She is not in acute distress.    Appearance:  Normal appearance.  HENT:     Head: Normocephalic and atraumatic.     Right Ear: External ear normal.     Left Ear: External ear normal.     Mouth/Throat:     Pharynx: No oropharyngeal exudate or posterior oropharyngeal erythema.  Eyes:     General: No scleral icterus.       Right eye: No discharge.        Left eye: No  discharge.     Conjunctiva/sclera: Conjunctivae normal.  Neck:     Thyroid : No thyromegaly.  Cardiovascular:     Rate and Rhythm: Normal rate and regular rhythm.  Pulmonary:     Effort: No respiratory distress.     Breath sounds: Normal breath sounds. No wheezing.  Abdominal:     General: Bowel sounds are normal.     Palpations: Abdomen is soft.     Tenderness: There is no abdominal tenderness.  Musculoskeletal:        General: No swelling or tenderness.     Cervical back: Neck supple. No tenderness.  Lymphadenopathy:     Cervical: No cervical adenopathy.  Skin:    Findings: No erythema or rash.  Neurological:     Mental Status: She is alert.  Psychiatric:        Mood and Affect: Mood normal.        Behavior: Behavior normal.         Outpatient Encounter Medications as of 08/16/2023  Medication Sig   albuterol  (VENTOLIN  HFA) 108 (90 Base) MCG/ACT inhaler Inhale 2 puffs into the lungs every 6 (six) hours as needed for wheezing or shortness of breath.   anastrozole  (ARIMIDEX ) 1 MG tablet Take 1 tablet (1 mg total) by mouth daily.   busPIRone  (BUSPAR ) 10 MG tablet Take 1 tablet (10 mg total) by mouth 3 (three) times daily.   citalopram  (CELEXA ) 40 MG tablet Take 1 tablet (40 mg total) by mouth daily.   clonazePAM  (KLONOPIN ) 0.5 MG tablet Take 1 tablet (0.5 mg total) by mouth 2 (two) times daily.   lamoTRIgine  (LAMICTAL ) 25 MG tablet Take 1 tablet (25 mg total) by mouth daily after lunch.   potassium chloride  (KLOR-CON ) 10 MEQ tablet Take one tablet by mouth once daily.   pravastatin  (PRAVACHOL ) 20 MG tablet Take 1 tablet (20 mg total) by mouth daily.    QUEtiapine  (SEROQUEL ) 50 MG tablet Take 1 tablet (50 mg total) by mouth at bedtime.   risperiDONE  (RISPERDAL ) 0.5 MG tablet Take 1 tablet (0.5 mg total) by mouth daily.   Tiotropium Bromide -Olodaterol (STIOLTO RESPIMAT ) 2.5-2.5 MCG/ACT AERS Inhale 2 puffs into the lungs daily.   traZODone  (DESYREL ) 50 MG tablet Take 25-50 mg by mouth at bedtime as needed for sleep.   triamterene -hydrochlorothiazide  (MAXZIDE -25) 37.5-25 MG tablet Take 1 tablet by mouth daily each morning for fluid retention.   triamterene -hydrochlorothiazide  (MAXZIDE -25) 37.5-25 MG tablet Take 0.5 tablets by mouth daily.   No facility-administered encounter medications on file as of 08/16/2023.     Lab Results  Component Value Date   WBC 8.0 08/16/2023   HGB 12.1 08/16/2023   HCT 36.5 08/16/2023   PLT 207.0 08/16/2023   GLUCOSE 84 08/16/2023   CHOL 190 07/28/2023   TRIG 58.0 07/28/2023   HDL 91.30 07/28/2023   LDLDIRECT 148.4 11/03/2012   LDLCALC 87 07/28/2023   ALT 10 07/28/2023   AST 19 07/28/2023   NA 141 08/16/2023   K 3.9 08/16/2023   CL 99 08/16/2023   CREATININE 0.90 08/16/2023   BUN 17 08/16/2023   CO2 35 (H) 08/16/2023   TSH 1.04 04/20/2023   HGBA1C 5.5 07/28/2023    CT CHEST HIGH RESOLUTION Result Date: 07/07/2023 CLINICAL DATA:  Shortness of breath, restrictive lung disease. Breast cancer. EXAM: CT CHEST WITHOUT CONTRAST TECHNIQUE: Multidetector CT imaging of the chest was performed following the standard protocol without intravenous contrast. High resolution imaging of the lungs, as well as inspiratory and expiratory imaging,  was performed. RADIATION DOSE REDUCTION: This exam was performed according to the departmental dose-optimization program which includes automated exposure control, adjustment of the mA and/or kV according to patient size and/or use of iterative reconstruction technique. COMPARISON:  11/08/2014. FINDINGS: Cardiovascular: Atherosclerotic calcification of the aorta. Heart is at the  upper limits of normal in size. No pericardial effusion. Mediastinum/Nodes: 1.3 cm low-attenuation left thyroid  nodule. No follow-up recommended. (Ref: J Am Coll Radiol. 2015 Feb;12(2): 143-50).Scarring Lungs/Pleura: Negative for subpleural reticulation, traction bronchiectasis/bronchiolectasis, ground glass, architectural distortion or honeycombing. Scattered bibasilar scarring. Calcified granulomas. Ground-glass, central volume loss and mild architectural distortion in the left lower lobe. Upper Abdomen: Cholecystectomy. Visualized portions of the liver, adrenal glands, kidneys, spleen, pancreas, stomach and bowel are otherwise grossly unremarkable. No upper abdominal adenopathy. Musculoskeletal: Severe reverse S shaped scoliosis with associated distortion of the left ribs and left hemithorax. Degenerative changes in the spine. IMPRESSION: 1. No evidence of interstitial lung disease. 2. Ground-glass and central volume loss in the left lower lobe is of uncertain acuity but postinfectious/postinflammatory scarring is favored. 3.  Aortic atherosclerosis (ICD10-I70.0). Electronically Signed   By: Newell Eke M.D.   On: 07/07/2023 11:12       Assessment & Plan:  Hypercholesterolemia Assessment & Plan: On pravastatin .  Low cholesterol diet and exercise. Follow lipid panel. No changes today.   Orders: -     CBC with Differential/Platelet -     Basic metabolic panel with GFR  Anxiety Assessment & Plan: Had been followed by psychiatry. Stable. Continue citalopram .    Mild intermittent asthma without complication Assessment & Plan: Continue stiolto.  Has rescue inhaler if needed.  Breathing stable. No acute pulmonary issues. Surgery planned with regional block.    Chronic restrictive lung disease Assessment & Plan: Is followed by pulmonary. Breathing stable.    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female, estrogen receptor positive (HCC) Assessment & Plan: Followed  by oncology for f/u breast cancer - last seen 11/20/22. Transitioned from tamoxifen  to anastrozole . Need to confirm if taking.  Followed by oncology.  Mammogram 05/09/13 - Birads II. Continue f/u with oncology.    Hyperglycemia Assessment & Plan: Low carb diet and exercise. Follow met b and A1c.    Gastroesophageal reflux disease with esophagitis without hemorrhage Assessment & Plan: No upper symptoms reported.     Essential hypertension, benign Assessment & Plan: Blood pressure as outlined.  On triam/hctz.  Follow pressures.  Recheck metabolic panel today.    Decreased GFR Assessment & Plan: Noted on recent lab check. Stay hydrated. Avoid antiinflammatory medications. Recheck metabolic panel today.    Pre-op evaluation Assessment & Plan: Planning for hand surgery - regional block. Breathing stable. Recent EKG - SR with no acute ischemic changes. Will recheck labs. Recent labs - decreased GFR - unclear etiology.  Stay hydrated. Check met b today.       Allena Hamilton, MD

## 2023-08-17 ENCOUNTER — Ambulatory Visit: Payer: Self-pay | Admitting: Internal Medicine

## 2023-08-17 NOTE — Telephone Encounter (Signed)
 Copied from CRM [id number]. Topic: Clinical - Lab/Test Results >> Aug 17, 2023 12:39 PM Viola F wrote: Reason for CRM: [facility] returned Daijahs phone call, please call [phone]

## 2023-08-17 NOTE — Telephone Encounter (Signed)
 Left message to have office call back. Direct line given

## 2023-08-21 ENCOUNTER — Encounter: Payer: Self-pay | Admitting: Internal Medicine

## 2023-08-21 NOTE — Assessment & Plan Note (Signed)
 Noted on recent lab check. Stay hydrated. Avoid antiinflammatory medications. Recheck metabolic panel today.

## 2023-08-21 NOTE — Assessment & Plan Note (Signed)
 Low-carb diet and exercise.  Follow met b and A1c.

## 2023-08-21 NOTE — Assessment & Plan Note (Signed)
 Blood pressure as outlined.  On triam/hctz.  Follow pressures.  Recheck metabolic panel today.

## 2023-08-21 NOTE — Assessment & Plan Note (Signed)
 On pravastatin .  Low cholesterol diet and exercise. Follow lipid panel. No changes today.

## 2023-08-21 NOTE — Assessment & Plan Note (Signed)
 Continue stiolto.  Has rescue inhaler if needed.  Breathing stable. No acute pulmonary issues. Surgery planned with regional block.

## 2023-08-21 NOTE — Assessment & Plan Note (Signed)
 Followed by oncology for f/u breast cancer - last seen 11/20/22. Transitioned from tamoxifen  to anastrozole . Need to confirm if taking.  Followed by oncology.  Mammogram 05/09/13 - Birads II. Continue f/u with oncology.

## 2023-08-21 NOTE — Assessment & Plan Note (Signed)
No upper symptoms reported.   

## 2023-08-21 NOTE — Assessment & Plan Note (Addendum)
 Had been followed by psychiatry. Stable. Continue citalopram .

## 2023-08-21 NOTE — Assessment & Plan Note (Signed)
 Is followed by pulmonary. Breathing stable.

## 2023-08-21 NOTE — Assessment & Plan Note (Signed)
 Planning for hand surgery - regional block. Breathing stable. Recent EKG - SR with no acute ischemic changes. Will recheck labs. Recent labs - decreased GFR - unclear etiology.  Stay hydrated. Check met b today.

## 2023-08-24 ENCOUNTER — Other Ambulatory Visit: Payer: Self-pay | Admitting: Psychiatry

## 2023-08-24 DIAGNOSIS — F331 Major depressive disorder, recurrent, moderate: Secondary | ICD-10-CM

## 2023-08-24 NOTE — Telephone Encounter (Signed)
 Please contact to schedule with Dr. Eappen

## 2023-09-02 ENCOUNTER — Telehealth: Payer: Self-pay

## 2023-09-02 NOTE — Telephone Encounter (Signed)
 Called pt. They have oxygen  from [facility] and [facility]. They do not want the oxygen  from [facility] because the tank is too big. Husband instructed to call the number on the tank.

## 2023-09-02 NOTE — Telephone Encounter (Signed)
 Anice Belt, CMA    09/02/23  9:48 AM Unsigned Note Copied from CRM [id number]. Topic: Clinical - Order For Equipment >> Sep 02, 2023  9:37 AM Lavanda D wrote: Reason for CRM: Patient is calling to speak with Trish. Patient would like a call back [phone]. She said it is about the breathing machine that was sent, she said it is too heavy - they can't move it, they would to return it.

## 2023-09-02 NOTE — Telephone Encounter (Signed)
 Copied from CRM [id number]. Topic: Clinical - Order For Equipment >> Sep 02, 2023  9:37 AM Lavanda D wrote: Reason for CRM: Patient is calling to speak with Trish. Patient would like a call back [phone]. She said it is about the breathing machine that was sent, she said it is too heavy - they can't move it, they would to return it.

## 2023-09-02 NOTE — Telephone Encounter (Signed)
 Copied from CRM [id number]. Topic: General - Other >> Sep 02, 2023  2:03 PM Berneda FALCON wrote: Reason for CRM: Pt's husband Georgette is calling back again and wanted to see if he can speak to Trish about the oxygen  tank. States that it is too heavy and he cannot lift it up. Wants to continue to use the one they had from [place]. I see the notes that he was already told to call the number on the oxygen  tank from [facility], but he would still like to speak to her please.  Please call him back at [phone]

## 2023-09-02 NOTE — Telephone Encounter (Signed)
 Called [facility] myself and was advised that patient will need to have a d/c order in order to go pick up oxygen  even though they have duplicate supplies. Ok to write order for them?

## 2023-09-02 NOTE — Telephone Encounter (Signed)
 Called [name] again to let him know that he will need to call [facility] and have them come out and get the tank since they already have O2 at home through [place]. They want to keep their supplies with [facility].

## 2023-09-03 NOTE — Telephone Encounter (Signed)
 D/C order printed for you to sign when you return to office

## 2023-09-03 NOTE — Telephone Encounter (Signed)
 If she is receiving oxygen  through [facility] and wants to continue receiving supplies through [facility], ok to notify [facility] to cancel the order. Sounds like is duplicate order.  She does need the oxygen . It appears they have a preference and want [facility] to continue providing oxygen  and supplies.

## 2023-09-13 ENCOUNTER — Other Ambulatory Visit: Payer: Self-pay | Admitting: Psychiatry

## 2023-09-13 ENCOUNTER — Other Ambulatory Visit: Payer: Self-pay | Admitting: Internal Medicine

## 2023-09-13 DIAGNOSIS — F411 Generalized anxiety disorder: Secondary | ICD-10-CM

## 2023-09-13 DIAGNOSIS — F321 Major depressive disorder, single episode, moderate: Secondary | ICD-10-CM

## 2023-09-13 NOTE — Telephone Encounter (Signed)
 Message has already been sent to provider.

## 2023-09-21 ENCOUNTER — Other Ambulatory Visit: Payer: Self-pay | Admitting: Internal Medicine

## 2023-09-21 ENCOUNTER — Ambulatory Visit: Admitting: Student in an Organized Health Care Education/Training Program

## 2023-09-21 NOTE — Telephone Encounter (Signed)
 Please refused last filled on 09/14/2023 # 30 with 0 refills.

## 2023-09-23 NOTE — Telephone Encounter (Signed)
 Rx ok'd for pravastatin 

## 2023-09-24 ENCOUNTER — Other Ambulatory Visit: Payer: Self-pay | Admitting: Psychiatry

## 2023-09-24 DIAGNOSIS — F331 Major depressive disorder, recurrent, moderate: Secondary | ICD-10-CM

## 2023-10-14 ENCOUNTER — Telehealth (INDEPENDENT_AMBULATORY_CARE_PROVIDER_SITE_OTHER): Payer: Self-pay | Admitting: Psychiatry

## 2023-10-14 DIAGNOSIS — F331 Major depressive disorder, recurrent, moderate: Secondary | ICD-10-CM

## 2023-10-14 NOTE — Progress Notes (Signed)
 Patient did not connect for the video appointment.  Hence patient was contacted by phone.  Her husband Mr. Dajon Rowe initially picked up the call.  Patient connected for the phone call and stated that she had moved away from [place].  Patient would like to cancel today's appointment.  She will call back as needed.

## 2023-10-19 ENCOUNTER — Other Ambulatory Visit: Payer: Self-pay | Admitting: Psychiatry

## 2023-10-19 DIAGNOSIS — F3342 Major depressive disorder, recurrent, in full remission: Secondary | ICD-10-CM

## 2023-10-19 DIAGNOSIS — F331 Major depressive disorder, recurrent, moderate: Secondary | ICD-10-CM

## 2023-10-19 DIAGNOSIS — F411 Generalized anxiety disorder: Secondary | ICD-10-CM

## 2023-10-20 NOTE — Telephone Encounter (Signed)
 I have sent Celexa  and Lamictal  to pharmacy for 30-day supply since I received a treatment request.  No future refills since patient has not been seen in over 6 months and did not want to keep her appointment in September due to relocating to [place].

## 2023-10-26 ENCOUNTER — Telehealth: Payer: Self-pay

## 2023-10-26 NOTE — Telephone Encounter (Signed)
 Copied from CRM #[id number]. Topic: General - Other >> Oct 26, 2023 11:14 AM Rea ORN wrote: Reason for CRM: Gattis [phone]) with [facility] called to advise the pt has moved from [facility] and they wanted her updated address. I advised that we only have the [facility] address.  Gattis also wanted to inform PCP that pt stated she no longer uses her oxygen  and they needs a pick up order for her concentrators.

## 2023-10-27 NOTE — Telephone Encounter (Signed)
 Called pt she is using [facility] oxygen  at night. New address is [address] [place], [place]. Will update chart and f/u with [facility].

## 2023-11-05 ENCOUNTER — Telehealth: Payer: Self-pay | Admitting: Internal Medicine

## 2023-11-10 NOTE — Telephone Encounter (Signed)
 I have refilled the potassium x 1. She needs a follow up appt scheduled with me. Please schedule.

## 2023-11-11 ENCOUNTER — Ambulatory Visit: Admitting: Student in an Organized Health Care Education/Training Program

## 2023-11-11 ENCOUNTER — Encounter: Payer: Self-pay | Admitting: Student in an Organized Health Care Education/Training Program

## 2023-11-11 VITALS — BP 122/68 | HR 68 | Temp 97.5°F | Ht 59.0 in | Wt 150.2 lb

## 2023-11-11 DIAGNOSIS — M419 Scoliosis, unspecified: Secondary | ICD-10-CM | POA: Diagnosis not present

## 2023-11-11 DIAGNOSIS — J984 Other disorders of lung: Secondary | ICD-10-CM | POA: Diagnosis not present

## 2023-11-11 DIAGNOSIS — I371 Nonrheumatic pulmonary valve insufficiency: Secondary | ICD-10-CM

## 2023-11-11 DIAGNOSIS — I519 Heart disease, unspecified: Secondary | ICD-10-CM

## 2023-11-11 DIAGNOSIS — J9611 Chronic respiratory failure with hypoxia: Secondary | ICD-10-CM

## 2023-11-11 NOTE — Progress Notes (Signed)
 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 She also has chronic hypoxic respiratory failure for which she was prescribed oxygen  which she is using inconsistently. She was previously switched to Stiolto Respimat  which she is currently prescribed. PFT's had shown a slow steady decline, but most recent repeat shows FEV1 and FVC to be stable.  Symptoms improve with inhaler use (Stiolto). She has no cough, wheeze, chest pain, or chest tightness. Lungs are clear on examination. High-resolution chest CT confirms kyphoscoliosis without interstitial lung disease.   -Continue Stiolto two puffs daily  #Right ventricular dysfunction    Echocardiogram shows right ventricular dysfunction with slightly reduced right heart function. The significance is uncertain and requires further evaluation. Refer to a cardiologist for evaluation of right ventricular dysfunction.  -Ambulatory Referral to Cardiology   Return in about 1 year (around 11/10/2024).  Belva November, MD [facility]  I spent 30 minutes caring for this patient today, including preparing to see the patient, obtaining a medical history , reviewing a separately obtained history, performing a medically appropriate examination and/or evaluation, counseling and educating the patient/family/caregiver, ordering medications, tests, or procedures, documenting clinical information in the electronic health record, and independently interpreting results (not separately reported/billed) and communicating results to the patient/family/caregiver  End of visit medications:  No orders of the defined types were placed in this encounter.    Current Outpatient Medications:    albuterol  (VENTOLIN  HFA) 108 (90 Base) MCG/ACT inhaler,  Inhale 2 puffs into the lungs every 6 (six) hours as needed for wheezing or shortness of breath., Disp: 8 g, Rfl: 2   anastrozole  (ARIMIDEX ) 1 MG tablet, Take 1 tablet (1 mg total) by mouth daily., Disp: 30 tablet, Rfl: 11   busPIRone  (BUSPAR ) 10 MG tablet, Take 1 tablet (10 mg total) by mouth 3 (three) times daily., Disp: 90 tablet, Rfl: 2   citalopram  (CELEXA ) 40 MG tablet, Take 1 tablet (40 mg total) by mouth daily., Disp: 30 tablet, Rfl: 0   clonazePAM  (KLONOPIN ) 0.5 MG tablet, Take 1 tablet (0.5 mg total) by mouth 2 (two) times daily., Disp: 60 tablet, Rfl: 5   lamoTRIgine  (LAMICTAL ) 25 MG tablet, Take 1 tablet (25 mg total) by mouth daily after lunch., Disp: 30 tablet, Rfl: 0   potassium chloride  (KLOR-CON ) 10 MEQ tablet, Take one tablet by mouth once daily., Disp: 90 tablet, Rfl: 0   pravastatin  (PRAVACHOL ) 20 MG tablet, Take 1 tablet (20 mg total) by mouth daily., Disp: 90 tablet, Rfl: 1   QUEtiapine  (SEROQUEL ) 50 MG tablet, Take 1 tablet (50 mg total) by mouth at bedtime., Disp: 30 tablet, Rfl: 5   risperiDONE  (RISPERDAL ) 0.5 MG tablet, Take 1 tablet (0.5 mg total) by mouth daily., Disp: 30 tablet, Rfl: 5   traZODone  (DESYREL ) 50 MG tablet, Take 25-50 mg by mouth at bedtime as needed for sleep., Disp: , Rfl:    triamterene -hydrochlorothiazide  (MAXZIDE -25) 37.5-25 MG tablet, Take 1 tablet by mouth daily each morning for fluid retention., Disp: 90 tablet, Rfl: 0   triamterene -hydrochlorothiazide  (MAXZIDE -25) 37.5-25 MG tablet, Take 0.5 tablets by mouth daily., Disp: 30 tablet, Rfl: 1   Subjective:   [name] GENDER: female DOB: 13-Mar-1945, MRN: [id number]  Chief Complaint  Patient presents with   Shortness of Breath    No SOB, wheezing, or cough.  Albuterol   PRN.     HPI  Jennifer [name] is a 78 year old female with kyphoscoliosis and restrictive lung disease who presents for follow-up.   Return Visist 06/07/2023:  She reports that her exertional dyspnea is at  baseline.  She has no new symptoms since her last visit in July 2024.  She was unable to get her PFTs and has not had any recent imaging.  She has not used her inhaler consistently.  While she is prescribed oxygen , she has not been using it regularly.  She does report using it at home but she does not have it with her on presentation today.  She follows closely with her primary care physician, Dr. Glendia.   Patient has a past medical history of restrictive lung disease secondary to kyphoscoliosis previously followed at [facility]. Patient has had exertional dyspnea for many years and was by [facility] pulmonology 2010-2023.     Patient reports that her symptoms had been ongoing and persistent but she is unable to identify a point in time when the symptoms worsened. She is maintained on Stiolto which she uses 1 puff twice daily and feels some improvement with it.  She does not have a rescue inhaler such as albuterol .  She reports a one-time episode in the past where she had to use prednisone  but denies any hospitalizations for respiratory symptoms.   Patient denies any cough, wheezing, chest pain, chest tightness, sputum production, palpitations, fevers, chills, night sweats, or weight loss.  She was previously highly active and was working out at the gym to lose weight but has not done so for a while now. Patient denies any history of asthma growing up and reports that this was a newer diagnosis.  When she was a young adult she did experience shortness of breath with exertion secondary to her kyphoscoliosis.   Return Visit 11/11/2023:  Discussed the use of AI scribe software for clinical note transcription with the patient, who gave verbal consent to proceed.  She has a history of kyphoscoliosis with associated restrictive lung disease. Stiolto was started after her last visit. She feels better with the inhaler, using it as directed with two puffs in the morning. She also uses a nasal spray. She notes improvement in  her symptoms and feels her breathing is better controlled.  A high-resolution chest CT showed kyphoscoliosis but no interstitial lung disease. An echocardiogram revealed some right ventricular dysfunction. She has not experienced any cough, wheeze, chest pain, or chest tightness. She is able to perform daily activities with the assistance of her caregiver, who ensures she does not overexert herself. She takes her time during walks, and she feels she can eat more than before.      Patient is a lifelong non-smoker and denies any occupational exposures.  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  They previously had a cat but do not currently have any pets.  There is a family members to have dogs and her symptoms do not get worse around those.  She has not had any birds.  Lives at home, since 1979, with no water leaks or mold reported.   Spirometry from 2025 showed FVC of 1.22 L, FEV1 of 0.92 L, and FEV1/FVC of 0.75 Spirometry from 2022 showed FVC of 0.89 L, FEV1 of 0.72 L, FEV1/FVC of 0.81. Spirometry from 2019 showed FVC of 1.37 L, FEV1 of 0.96 L, FEV1/FVC of 0.7 Spirometry from 2018 showed FVC of 1.2 L, FEV1 of 0.91 L,  FEV1/FVC of 0.76 Spirometry from 2017 showed FVC of 1.44 L, FEV1 of 1 L, FEV1/FVC of 0.7  Ancillary information including prior medications, full medical/surgical/family/social histories, and PFTs (when available) are listed below and have been reviewed.    Review of Systems  Constitutional:  Negative for chills, fever and weight loss.  Respiratory:  Positive for shortness of breath. Negative for cough, hemoptysis, sputum production and wheezing.   Cardiovascular:  Negative for chest pain.     Objective:   Vitals:   11/11/23 1342  BP: 122/68  Pulse: 68  Temp: (!) 97.5 F (36.4 C)  SpO2: 96%  Weight: 150 lb 3.2 oz (68.1 kg)  Height: 4' 11 (1.499 m)   96% on RA  BMI Readings from Last 3 Encounters:  11/11/23 30.34 kg/m  08/16/23  31.91 kg/m  07/28/23 30.98 kg/m   Wt Readings from Last 3 Encounters:  11/11/23 150 lb 3.2 oz (68.1 kg)  08/16/23 158 lb (71.7 kg)  07/28/23 153 lb 6.4 oz (69.6 kg)    Physical Exam    Ancillary Information    Past Medical History:  Diagnosis Date   Anxiety    panic attacks   Arthritis    Asthma    Bronchitis    COPD (chronic obstructive pulmonary disease) (HCC)    Depression    Dyspnea    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 Lynwood Nasuti   Hypertension    PONV (postoperative nausea and vomiting)    Scoliosis    Ulcer      Family History  Problem Relation Age of Onset   Stroke Mother    Cancer Father        unknown type   Cancer Brother        blood cancer   CVA Maternal Grandmother    Healthy Son    Breast cancer Neg Hx    Colon cancer Neg Hx    Mental illness Neg Hx      Past Surgical History:  Procedure Laterality Date   ABDOMINAL HYSTERECTOMY     ANTERIOR CERVICAL DECOMP/DISCECTOMY FUSION N/A 04/19/2017   Procedure: Anterior Cervical Decompression Fusion Cervical Three-Four, Cervical Four-Five, Cervical Five-Six;  Surgeon: Onetha Kuba, MD;  Location: [facility] OR;  Service: Neurosurgery;  Laterality: N/A;  Anterior Cervical Decompression Fusion Cervical Three-Four, Cervical Four-Five, Cervical Five-Six   BREAST BIOPSY Right 11/14/2020   US  Bx, Venus Clip, Invasive Mammary Carcinoma   BREAST LUMPECTOMY Right 2022   With radiation   BREAST LUMPECTOMY WITH RADIOACTIVE SEED AND SENTINEL LYMPH NODE BIOPSY Right 12/27/2020   Procedure: RIGHT BREAST LUMPECTOMY WITH RADIOACTIVE SEED AND AXILLARY SENTINEL LYMPH NODE BIOPSY;  Surgeon: Ebbie Cough, MD;  Location: [facility] OR;  Service: General;  Laterality: Right;   CHOLECYSTECTOMY  1996   DILATION AND CURETTAGE OF UTERUS  1971   LAPAROSCOPIC REMOVAL OF MESENTERIC MASS     LUMBAR  DISC SURGERY  1998   [facility]   PARTIAL HYSTERECTOMY  1981   prolapsed uterus   TUBAL LIGATION  1978    Social History   Socioeconomic History   Marital status: Married    Spouse name: Ellender Burden   Number of children: 2   Years of education: Not on file   Highest education level: 12th grade  Occupational History   Occupation: retired Runner, broadcasting/film/video  Tobacco Use  Smoking status: Never   Smokeless tobacco: Never  Vaping Use   Vaping status: Never Used  Substance and Sexual Activity   Alcohol use: No    Alcohol/week: 0.0 standard drinks of alcohol   Drug use: No   Sexual activity: Not Currently    Comment: not asked if sexually active  Other Topics Concern   Not on file  Social History Narrative   Right handed    Lives with husband   Had breast cancer surgery right side   Worked in the school system    Social Drivers of Health   Financial Resource Strain: Low Risk  (02/16/2023)   Overall Financial Resource Strain (CARDIA)    Difficulty of Paying Living Expenses: Not hard at all  Food Insecurity: No Food Insecurity (02/16/2023)   Hunger Vital Sign    Worried About Running Out of Food in the Last Year: Never true    Ran Out of Food in the Last Year: Never true  Transportation Needs: No Transportation Needs (02/16/2023)   PRAPARE - Administrator, Civil Service (Medical): No    Lack of Transportation (Non-Medical): No  Physical Activity: Inactive (02/16/2023)   Exercise Vital Sign    Days of Exercise per Week: 0 days    Minutes of Exercise per Session: 0 min  Stress: No Stress Concern Present (02/16/2023)   [employer] of Occupational Health - Occupational Stress Questionnaire    Feeling of Stress : Only a little  Recent Concern: Stress - Stress Concern Present (02/11/2023)   [employer] of Occupational Health - Occupational Stress Questionnaire    Feeling of Stress : To some extent  Social Connections: Moderately Integrated (02/16/2023)   Social  Connection and Isolation Panel    Frequency of Communication with Friends and Family: More than three times a week    Frequency of Social Gatherings with Friends and Family: Once a week    Attends Religious Services: More than 4 times per year    Active Member of [employer] or Organizations: No    Attends Banker Meetings: Never    Marital Status: Married  Catering manager Violence: Not At Risk (02/16/2023)   Humiliation, Afraid, Rape, and Kick questionnaire    Fear of Current or Ex-Partner: No    Emotionally Abused: No    Physically Abused: No    Sexually Abused: No     Allergies  Allergen Reactions   Advair Diskus [Fluticasone -Salmeterol] Other (See Comments)    Causes blisters in her mouth   Codeine     Unknown reaction   Pollen Extract Cough     CBC    Component Value Date/Time   WBC 8.0 08/16/2023 1142   RBC 3.79 (L) 08/16/2023 1142   HGB 12.1 08/16/2023 1142   HGB 11.8 (L) 06/22/2023 1048   HCT 36.5 08/16/2023 1142   PLT 207.0 08/16/2023 1142   PLT 194 06/22/2023 1048   MCV 96.3 08/16/2023 1142   MCH 32.0 06/22/2023 1048   MCHC 33.2 08/16/2023 1142   RDW 13.5 08/16/2023 1142   LYMPHSABS 1.9 08/16/2023 1142   MONOABS 0.6 08/16/2023 1142   EOSABS 0.5 08/16/2023 1142   BASOSABS 0.1 08/16/2023 1142    Pulmonary Functions Testing Results:    Latest Ref Rng & Units 06/15/2023   12:10 PM  PFT Results  FVC-Pre L 0.97   FVC-Predicted Pre % 44   FVC-Post L 1.22   FVC-Predicted Post % 55   Pre FEV1/FVC % %  61   Post FEV1/FCV % % 75   FEV1-Pre L 0.60   FEV1-Predicted Pre % 36   FEV1-Post L 0.92   DLCO uncorrected ml/min/mmHg 9.79   DLCO UNC% % 62   DLVA Predicted % 93   TLC L 2.99   TLC % Predicted % 69   RV % Predicted % 84     Outpatient Medications Prior to Visit  Medication Sig Dispense Refill   albuterol  (VENTOLIN  HFA) 108 (90 Base) MCG/ACT inhaler Inhale 2 puffs into the lungs every 6 (six) hours as needed for wheezing or shortness of breath.  8 g 2   anastrozole  (ARIMIDEX ) 1 MG tablet Take 1 tablet (1 mg total) by mouth daily. 30 tablet 11   busPIRone  (BUSPAR ) 10 MG tablet Take 1 tablet (10 mg total) by mouth 3 (three) times daily. 90 tablet 2   citalopram  (CELEXA ) 40 MG tablet Take 1 tablet (40 mg total) by mouth daily. 30 tablet 0   clonazePAM  (KLONOPIN ) 0.5 MG tablet Take 1 tablet (0.5 mg total) by mouth 2 (two) times daily. 60 tablet 5   lamoTRIgine  (LAMICTAL ) 25 MG tablet Take 1 tablet (25 mg total) by mouth daily after lunch. 30 tablet 0   potassium chloride  (KLOR-CON ) 10 MEQ tablet Take one tablet by mouth once daily. 90 tablet 0   pravastatin  (PRAVACHOL ) 20 MG tablet Take 1 tablet (20 mg total) by mouth daily. 90 tablet 1   QUEtiapine  (SEROQUEL ) 50 MG tablet Take 1 tablet (50 mg total) by mouth at bedtime. 30 tablet 5   risperiDONE  (RISPERDAL ) 0.5 MG tablet Take 1 tablet (0.5 mg total) by mouth daily. 30 tablet 5   traZODone  (DESYREL ) 50 MG tablet Take 25-50 mg by mouth at bedtime as needed for sleep.     triamterene -hydrochlorothiazide  (MAXZIDE -25) 37.5-25 MG tablet Take 1 tablet by mouth daily each morning for fluid retention. 90 tablet 0   triamterene -hydrochlorothiazide  (MAXZIDE -25) 37.5-25 MG tablet Take 0.5 tablets by mouth daily. 30 tablet 1   No facility-administered medications prior to visit.

## 2023-11-11 NOTE — Telephone Encounter (Signed)
 Noted

## 2023-11-11 NOTE — Patient Instructions (Signed)
  YOUR PLAN: -KYPHOSCOLIOSIS WITH RESTRICTIVE LUNG DISEASE: Kyphoscoliosis is a spinal deformity that can cause restrictive lung disease, making it difficult to breathe. You are responding well to the Stiolto inhaler, which you should continue using as prescribed. Your lungs are clear, and your recent chest CT showed no additional lung disease.  -RIGHT VENTRICULAR DYSFUNCTION: Right ventricular dysfunction means that the right side of your heart is not pumping blood into your lungs as well as it should. This needs further evaluation, so you will be referred to a cardiologist for more tests and recommendations.  INSTRUCTIONS: Please continue using your Stiolto inhaler as prescribed. Follow up with the cardiologist for further evaluation of your right ventricular dysfunction. If you experience any new symptoms or have concerns, contact our office.

## 2023-11-12 ENCOUNTER — Ambulatory Visit (INDEPENDENT_AMBULATORY_CARE_PROVIDER_SITE_OTHER): Admitting: Internal Medicine

## 2023-11-12 DIAGNOSIS — E7800 Pure hypercholesterolemia, unspecified: Secondary | ICD-10-CM

## 2023-11-12 DIAGNOSIS — R739 Hyperglycemia, unspecified: Secondary | ICD-10-CM

## 2023-11-12 DIAGNOSIS — J984 Other disorders of lung: Secondary | ICD-10-CM

## 2023-11-12 NOTE — Progress Notes (Deleted)
 Subjective:    [name], female    DOB: 1945/12/07, 78 y.o.   MRN: [id number]  Patient here for No chief complaint on file.   HPI Here for a scheduled follow up - follow up regarding hypertension, hypercholesterolemia and increased anxiety. Had f/u with pulmonary yesterday. Recommended to continue stiolto. Referred to cardiology for right ventricular dysfunction.    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 Lynwood Nasuti   Hypertension    PONV (postoperative nausea and vomiting)    Scoliosis    Ulcer    Past Surgical History:  Procedure Laterality Date   ABDOMINAL HYSTERECTOMY     ANTERIOR CERVICAL DECOMP/DISCECTOMY FUSION N/A 04/19/2017   Procedure: Anterior Cervical Decompression Fusion Cervical Three-Four, Cervical Four-Five, Cervical Five-Six;  Surgeon: Onetha Kuba, MD;  Location: [facility] OR;  Service: Neurosurgery;  Laterality: N/A;  Anterior Cervical Decompression Fusion Cervical Three-Four, Cervical Four-Five, Cervical Five-Six   BREAST BIOPSY Right 11/14/2020   US  Bx, Venus Clip, Invasive Mammary Carcinoma   BREAST LUMPECTOMY Right 2022   With radiation   BREAST LUMPECTOMY WITH RADIOACTIVE SEED AND SENTINEL LYMPH NODE BIOPSY Right 12/27/2020   Procedure: RIGHT BREAST LUMPECTOMY WITH RADIOACTIVE SEED AND AXILLARY SENTINEL LYMPH NODE BIOPSY;  Surgeon: Ebbie Cough, MD;  Location: [facility] OR;  Service: General;  Laterality: Right;   CHOLECYSTECTOMY  1996   DILATION AND CURETTAGE OF UTERUS  1971   LAPAROSCOPIC REMOVAL OF MESENTERIC MASS     LUMBAR DISC SURGERY  1998   [facility]   PARTIAL HYSTERECTOMY  1981   prolapsed uterus   TUBAL LIGATION  1978    Family History  Problem Relation Age of Onset   Stroke Mother    Cancer Father        unknown type   Cancer Brother        blood cancer   CVA Maternal Grandmother    Healthy Son    Breast cancer Neg Hx    Colon cancer Neg Hx    Mental illness Neg Hx    Social History   Socioeconomic History   Marital status: Married    Spouse name: Ellender Burden   Number of children: 2   Years of education: Not on file   Highest education level: 12th grade  Occupational History   Occupation: retired Runner, broadcasting/film/video  Tobacco Use   Smoking status: Never   Smokeless tobacco: Never  Vaping Use   Vaping status: Never Used  Substance and Sexual Activity   Alcohol use: No    Alcohol/week: 0.0 standard drinks of alcohol   Drug use: No   Sexual activity: Not Currently    Comment: not asked if sexually active  Other Topics Concern   Not on file  Social History Narrative   Right handed    Lives with husband   Had breast cancer surgery right side   Worked in the school system    Social Drivers of Health   Financial Resource Strain: Low Risk  (02/16/2023)   Overall Financial Resource Strain (CARDIA)    Difficulty of Paying Living  Expenses: Not hard at all  Food Insecurity: No Food Insecurity (02/16/2023)   Hunger Vital Sign    Worried About Running Out of Food in the Last Year: Never true    Ran Out of Food in the Last Year: Never true  Transportation Needs: No Transportation Needs (02/16/2023)   PRAPARE - Administrator, Civil Service (Medical): No    Lack of Transportation (Non-Medical): No  Physical Activity: Inactive (02/16/2023)   Exercise Vital Sign    Days of Exercise per Week: 0 days    Minutes of Exercise per Session: 0 min  Stress: No Stress Concern Present (02/16/2023)   [employer] of Occupational Health - Occupational Stress Questionnaire    Feeling of Stress : Only a little  Recent Concern: Stress - Stress Concern Present (02/11/2023)   [employer] of  Occupational Health - Occupational Stress Questionnaire    Feeling of Stress : To some extent  Social Connections: Moderately Integrated (02/16/2023)   Social Connection and Isolation Panel    Frequency of Communication with Friends and Family: More than three times a week    Frequency of Social Gatherings with Friends and Family: Once a week    Attends Religious Services: More than 4 times per year    Active Member of [employer] or Organizations: No    Attends Banker Meetings: Never    Marital Status: Married     Review of Systems     Objective:     There were no vitals taken for this visit. Wt Readings from Last 3 Encounters:  11/11/23 150 lb 3.2 oz (68.1 kg)  08/16/23 158 lb (71.7 kg)  07/28/23 153 lb 6.4 oz (69.6 kg)    Physical Exam  {Perform Simple Foot Exam  Perform Detailed exam:1} {Insert foot Exam (Optional):30965}   Outpatient Encounter Medications as of 11/12/2023  Medication Sig   albuterol  (VENTOLIN  HFA) 108 (90 Base) MCG/ACT inhaler Inhale 2 puffs into the lungs every 6 (six) hours as needed for wheezing or shortness of breath.   anastrozole  (ARIMIDEX ) 1 MG tablet Take 1 tablet (1 mg total) by mouth daily.   busPIRone  (BUSPAR ) 10 MG tablet Take 1 tablet (10 mg total) by mouth 3 (three) times daily.   citalopram  (CELEXA ) 40 MG tablet Take 1 tablet (40 mg total) by mouth daily.   clonazePAM  (KLONOPIN ) 0.5 MG tablet Take 1 tablet (0.5 mg total) by mouth 2 (two) times daily.   lamoTRIgine  (LAMICTAL ) 25 MG tablet Take 1 tablet (25 mg total) by mouth daily after lunch.   potassium chloride  (KLOR-CON ) 10 MEQ tablet Take one tablet by mouth once daily.   pravastatin  (PRAVACHOL ) 20 MG tablet Take 1 tablet (20 mg total) by mouth daily.   QUEtiapine  (SEROQUEL ) 50 MG tablet Take 1 tablet (50 mg total) by mouth at bedtime.   risperiDONE  (RISPERDAL ) 0.5 MG tablet Take 1 tablet (0.5 mg total) by mouth daily.   traZODone  (DESYREL ) 50 MG tablet Take 25-50 mg by mouth  at bedtime as needed for sleep.   triamterene -hydrochlorothiazide  (MAXZIDE -25) 37.5-25 MG tablet Take 1 tablet by mouth daily each morning for fluid retention.   triamterene -hydrochlorothiazide  (MAXZIDE -25) 37.5-25 MG tablet Take 0.5 tablets by mouth daily.   No facility-administered encounter medications on file as of 11/12/2023.     Lab Results  Component Value Date   WBC 8.0 08/16/2023   HGB 12.1 08/16/2023   HCT 36.5 08/16/2023   PLT 207.0 08/16/2023   GLUCOSE 84 08/16/2023  CHOL 190 07/28/2023   TRIG 58.0 07/28/2023   HDL 91.30 07/28/2023   LDLDIRECT 148.4 11/03/2012   LDLCALC 87 07/28/2023   ALT 10 07/28/2023   AST 19 07/28/2023   NA 141 08/16/2023   K 3.9 08/16/2023   CL 99 08/16/2023   CREATININE 0.90 08/16/2023   BUN 17 08/16/2023   CO2 35 (H) 08/16/2023   TSH 1.04 04/20/2023   HGBA1C 5.5 07/28/2023    CT CHEST HIGH RESOLUTION Result Date: 07/07/2023 CLINICAL DATA:  Shortness of breath, restrictive lung disease. Breast cancer. EXAM: CT CHEST WITHOUT CONTRAST TECHNIQUE: Multidetector CT imaging of the chest was performed following the standard protocol without intravenous contrast. High resolution imaging of the lungs, as well as inspiratory and expiratory imaging, was performed. RADIATION DOSE REDUCTION: This exam was performed according to the departmental dose-optimization program which includes automated exposure control, adjustment of the mA and/or kV according to patient size and/or use of iterative reconstruction technique. COMPARISON:  11/08/2014. FINDINGS: Cardiovascular: Atherosclerotic calcification of the aorta. Heart is at the upper limits of normal in size. No pericardial effusion. Mediastinum/Nodes: 1.3 cm low-attenuation left thyroid  nodule. No follow-up recommended. (Ref: J Am Coll Radiol. 2015 Feb;12(2): 143-50).Scarring Lungs/Pleura: Negative for subpleural reticulation, traction bronchiectasis/bronchiolectasis, ground glass, architectural distortion or  honeycombing. Scattered bibasilar scarring. Calcified granulomas. Ground-glass, central volume loss and mild architectural distortion in the left lower lobe. Upper Abdomen: Cholecystectomy. Visualized portions of the liver, adrenal glands, kidneys, spleen, pancreas, stomach and bowel are otherwise grossly unremarkable. No upper abdominal adenopathy. Musculoskeletal: Severe reverse S shaped scoliosis with associated distortion of the left ribs and left hemithorax. Degenerative changes in the spine. IMPRESSION: 1. No evidence of interstitial lung disease. 2. Ground-glass and central volume loss in the left lower lobe is of uncertain acuity but postinfectious/postinflammatory scarring is favored. 3.  Aortic atherosclerosis (ICD10-I70.0). Electronically Signed   By: Newell Eke M.D.   On: 07/07/2023 11:12       Assessment & Plan:  Hypercholesterolemia  Hyperglycemia     Allena Hamilton, MD

## 2023-11-13 ENCOUNTER — Encounter: Payer: Self-pay | Admitting: Internal Medicine

## 2023-11-13 NOTE — Progress Notes (Signed)
 [name], female   DOB: 1945-08-09, 78 y.o.   MRN: [id number] Cancled appt.

## 2023-11-17 ENCOUNTER — Other Ambulatory Visit: Payer: Self-pay | Admitting: Psychiatry

## 2023-11-17 DIAGNOSIS — F411 Generalized anxiety disorder: Secondary | ICD-10-CM

## 2023-11-18 ENCOUNTER — Telehealth: Payer: Self-pay | Admitting: Psychiatry

## 2023-11-18 ENCOUNTER — Telehealth: Payer: Self-pay

## 2023-11-18 ENCOUNTER — Other Ambulatory Visit: Payer: Self-pay | Admitting: Psychiatry

## 2023-11-18 ENCOUNTER — Encounter: Payer: Self-pay | Admitting: Psychiatry

## 2023-11-18 DIAGNOSIS — F411 Generalized anxiety disorder: Secondary | ICD-10-CM

## 2023-11-18 DIAGNOSIS — F3342 Major depressive disorder, recurrent, in full remission: Secondary | ICD-10-CM

## 2023-11-18 MED ORDER — QUETIAPINE FUMARATE 50 MG PO TABS: 50.0000 mg | ORAL_TABLET | Freq: Every day | ORAL | 0 refills | Status: DC

## 2023-11-18 MED ORDER — RISPERIDONE 0.5 MG PO TABS: 0.5000 mg | ORAL_TABLET | Freq: Every day | ORAL | 0 refills | Status: DC

## 2023-11-18 NOTE — Telephone Encounter (Signed)
 I have sent a 30-day supply of refills of clonazepam .

## 2023-11-18 NOTE — Telephone Encounter (Signed)
Spoke with daughter - appt made

## 2023-11-18 NOTE — Telephone Encounter (Signed)
 Spoke to Leita at the patients pharmacy she stated that the patient last picked up the Clonazepam  on 10-15-23 60 tablets and is in need of a refill I will call the patient and explan your message to her and let her know that you will send in a refill to avoid having withdrawals and that she will have to contact her PCP or establish care with another provider for future refills. Called patient to inform of the message no answer left a voicemail for patient to return call to office.

## 2023-11-18 NOTE — Telephone Encounter (Signed)
 Spoke to patients daughter Powell she stated that the patient was confused when she stated that she no longer needed the services from Dr Coby she did move to [place] but daughter stated that she would still be coming to see Dr Coby and wanted to schedule an appointment to see Dr Coby transferred call to front desk to schedule patient

## 2023-11-18 NOTE — Telephone Encounter (Signed)
 I have sent refills for risperidone  and Seroquel  for 30 days.  All other medications refills were approved in the recent past and no future refills will be provided.  I have printed out a letter which will be mailed out to patient.

## 2023-11-18 NOTE — Telephone Encounter (Signed)
 Patient daughter called stating there was miscommunication from her mom to our office. States she does live in [place] but will be able to come to appointments in [place]. Scheduled to come in on 11-22-23 and will get an ROI signed for daugther, Powell.

## 2023-11-18 NOTE — Telephone Encounter (Signed)
 Noted and thank you.  Correction  patient declined services when we connected for a scheduled appointment in September

## 2023-11-18 NOTE — Telephone Encounter (Signed)
 Patient as per our last phone conversation in September reported that she had moved away from [place] and is no longer needing our services.  I have received a refill request for clonazepam .  Will have staff contact patient to verify when her last pickup was and to discuss with her that she will need to either contact her primary care provider or establish care with a new provider for further refills.  However to avoid withdrawal symptoms I could give a one-time refill of this medication if needed.

## 2023-11-18 NOTE — Telephone Encounter (Signed)
 Noted

## 2023-11-18 NOTE — Telephone Encounter (Unsigned)
 Copied from CRM [id number]. Topic: Appointments - Scheduling Inquiry for Clinic >> Nov 18, 2023  2:31 PM Anairis L wrote: Reason for CRM: Daughter Powell would like to know if patient should make a follow up app soon.  Please advise.

## 2023-11-22 ENCOUNTER — Ambulatory Visit (INDEPENDENT_AMBULATORY_CARE_PROVIDER_SITE_OTHER): Admitting: Psychiatry

## 2023-11-22 ENCOUNTER — Encounter: Payer: Self-pay | Admitting: Psychiatry

## 2023-11-22 ENCOUNTER — Other Ambulatory Visit: Payer: Self-pay

## 2023-11-22 VITALS — BP 112/71 | HR 91 | Temp 97.3°F | Ht 59.0 in | Wt 149.6 lb

## 2023-11-22 DIAGNOSIS — R413 Other amnesia: Secondary | ICD-10-CM | POA: Diagnosis not present

## 2023-11-22 DIAGNOSIS — F3342 Major depressive disorder, recurrent, in full remission: Secondary | ICD-10-CM

## 2023-11-22 DIAGNOSIS — F411 Generalized anxiety disorder: Secondary | ICD-10-CM | POA: Diagnosis not present

## 2023-11-22 DIAGNOSIS — Z91148 Patient's other noncompliance with medication regimen for other reason: Secondary | ICD-10-CM | POA: Diagnosis not present

## 2023-11-22 MED ORDER — BUSPIRONE HCL 10 MG PO TABS
10.0000 mg | ORAL_TABLET | Freq: Three times a day (TID) | ORAL | 5 refills | Status: AC
Start: 2023-11-22 — End: ?

## 2023-11-22 MED ORDER — TRAZODONE HCL 50 MG PO TABS: 25.0000 mg | ORAL_TABLET | Freq: Every evening | ORAL | 5 refills | Status: DC | PRN

## 2023-11-22 MED ORDER — CITALOPRAM HYDROBROMIDE 40 MG PO TABS: 40.0000 mg | ORAL_TABLET | Freq: Every day | ORAL | 5 refills | Status: DC

## 2023-11-22 MED ORDER — LAMOTRIGINE 25 MG PO TABS: 25.0000 mg | ORAL_TABLET | Freq: Every day | ORAL | 5 refills | Status: AC

## 2023-11-22 MED ORDER — RISPERIDONE 0.5 MG PO TABS: 0.5000 mg | ORAL_TABLET | Freq: Every day | ORAL | 5 refills | Status: AC

## 2023-11-22 MED ORDER — CLONAZEPAM 0.5 MG PO TABS: 0.5000 mg | ORAL_TABLET | Freq: Two times a day (BID) | ORAL | 5 refills | Status: DC

## 2023-11-22 MED ORDER — QUETIAPINE FUMARATE 50 MG PO TABS: 50.0000 mg | ORAL_TABLET | Freq: Every day | ORAL | 5 refills | Status: AC

## 2023-11-22 NOTE — Progress Notes (Signed)
 [facility] MD OP Progress Note  11/22/2023 10:05 AM Jennifer [name]  MRN:  [id number]  Chief Complaint:  Chief Complaint  Patient presents with   Follow-up   Depression   Anxiety   Medication Refill   Discussed the use of AI scribe software for clinical note transcription with the patient, who gave verbal consent to proceed.  History of Present Illness Jennifer [name] is a 78 year old Caucasian female, married, retired, lives in [place] has a history of GAD, MDD, COPD, history of malignant neoplasm of upper-outer quadrant of right breast, estrogen positive, status postlumpectomy and radiation was evaluated in office today.  Patient was accompanied by her spouse,[name] who provided collateral information.  She and her husband report that she has been living in a new apartment complex in [place] with family nearby, which she describes as a positive and supportive environment. She states that she feels blessed to be living there and has made new friends. Her husband notes that she is currently doing better with regards to her anxiety and depression symptoms.  He reports that she eats well and appears to be doing well on her current medications. When asked directly, she denies experiencing anxiety, depression, or sleep problems. She reports sleeping well and feeling that her medications are effective for her.  She denies any suicidality, homicidality or perceptual disturbances.  Her husband describes providing significant support for her daily activities, including dressing and putting on shoes, as she cannot do these tasks independently. He states that she manages her own medications with oversight from [employer], which prepares her bubble packs and ensures she receives her medications. She confirms that she takes her medications as prescribed. Her husband reports that no recent changes have occurred in her medication regimen. She currently takes citalopram  40 mg (antidepressant), Lamictal  25 mg  daily after lunch (mood stabilizer), clonazepam  (recently picked up), Seroquel , risperidone  and trazodone .  She denies side effects.  She acknowledges some difficulty with memory recall and orientation, as well as challenges with certain cognitive tasks. Her husband notes that her memory gets a little shady at times.  A SLUMS was completed in session today to test her memory and she scored poor on it.    Visit Diagnosis:    ICD-10-CM   1. GAD (generalized anxiety disorder)  F41.1 busPIRone  (BUSPAR ) 10 MG tablet    citalopram  (CELEXA ) 40 MG tablet    clonazePAM  (KLONOPIN ) 0.5 MG tablet    QUEtiapine  (SEROQUEL ) 50 MG tablet    risperiDONE  (RISPERDAL ) 0.5 MG tablet    traZODone  (DESYREL ) 50 MG tablet    2. MDD (major depressive disorder), recurrent, in full remission  F33.42 citalopram  (CELEXA ) 40 MG tablet    QUEtiapine  (SEROQUEL ) 50 MG tablet    3. Overuse of medication  Z91.148    BZD per history    4. Memory loss  R41.3       Past Psychiatric History: I have reviewed past psychiatric history from progress note on 04/08/2022.  Past trials of BuSpar , Celexa , lorazepam .  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 Lynwood Nasuti   Hypertension    PONV (postoperative nausea and vomiting)    Scoliosis    Ulcer     Past Surgical History:  Procedure Laterality Date   ABDOMINAL HYSTERECTOMY     ANTERIOR CERVICAL DECOMP/DISCECTOMY FUSION N/A 04/19/2017   Procedure: Anterior Cervical Decompression Fusion Cervical Three-Four, Cervical Four-Five, Cervical Five-Six;  Surgeon: Onetha Kuba, MD;  Location[facility];  Service: Neurosurgery;  Laterality: N/A;   Anterior Cervical Decompression Fusion Cervical Three-Four, Cervical Four-Five, Cervical Five-Six   BREAST BIOPSY Right 11/14/2020   US  Bx, Venus Clip, Invasive Mammary Carcinoma   BREAST LUMPECTOMY Right 2022   With radiation   BREAST LUMPECTOMY WITH RADIOACTIVE SEED AND SENTINEL LYMPH NODE BIOPSY Right 12/27/2020   Procedure: RIGHT BREAST LUMPECTOMY WITH RADIOACTIVE SEED AND AXILLARY SENTINEL LYMPH NODE BIOPSY;  Surgeon: Ebbie Cough, MD;  Location: [facility] OR;  Service: General;  Laterality: Right;   CHOLECYSTECTOMY  1996   DILATION AND CURETTAGE OF UTERUS  1971   LAPAROSCOPIC REMOVAL OF MESENTERIC MASS     LUMBAR DISC SURGERY  1998   [facility]   PARTIAL HYSTERECTOMY  1981   prolapsed uterus   TUBAL LIGATION  1978    Family Psychiatric History: I have reviewed family psychiatric history from progress note on 04/08/2022.  Family History:  Family History  Problem Relation Age of Onset   Stroke Mother    Cancer Father        unknown type   Cancer Brother        blood cancer   CVA Maternal Grandmother    Healthy Son    Breast cancer Neg Hx    Colon cancer Neg Hx    Mental illness Neg Hx     Social History: I have reviewed social history from progress note on 04/08/2022. Social History   Socioeconomic History   Marital status: Married    Spouse name: Jennifer [name]   Number of children: 2   Years of education: Not on file   Highest education level: 12th grade  Occupational History   Occupation: retired runner, broadcasting/film/video  Tobacco Use   Smoking status: Never   Smokeless tobacco: Never  Vaping Use   Vaping status: Never Used  Substance and Sexual Activity   Alcohol use: No    Alcohol/week: 0.0 standard drinks of alcohol   Drug use: No   Sexual activity: Not Currently    Comment: not asked if sexually active  Other Topics Concern   Not on file  Social History Narrative   Right handed    Lives with husband   Had breast cancer surgery right side   Worked in the school system     Social Drivers of Health   Financial Resource Strain: Low Risk  (02/16/2023)   Overall Financial Resource Strain (CARDIA)    Difficulty of Paying Living Expenses: Not hard at all  Food Insecurity: No Food Insecurity (02/16/2023)   Hunger Vital Sign    Worried About Running Out of Food in the Last Year: Never true    Ran Out of Food in the Last Year: Never true  Transportation Needs: No Transportation Needs (02/16/2023)   PRAPARE - Administrator, Civil Service (Medical): No    Lack of Transportation (Non-Medical): No  Physical Activity: Inactive (02/16/2023)   Exercise Vital Sign    Days of Exercise per Week: 0 days    Minutes of Exercise per Session: 0 min  Stress: No Stress Concern Present (02/16/2023)  [employer] of Occupational Health - Occupational Stress Questionnaire    Feeling of Stress : Only a little  Recent Concern: Stress - Stress Concern Present (02/11/2023)   [employer] of Occupational Health - Occupational Stress Questionnaire    Feeling of Stress : To some extent  Social Connections: Moderately Integrated (02/16/2023)   Social Connection and Isolation Panel    Frequency of Communication with Friends and Family: More than three times a week    Frequency of Social Gatherings with Friends and Family: Once a week    Attends Religious Services: More than 4 times per year    Active Member of [employer] or Organizations: No    Attends Banker Meetings: Never    Marital Status: Married    Allergies:  Allergies  Allergen Reactions   Advair Diskus [Fluticasone -Salmeterol] Other (See Comments)    Causes blisters in her mouth   Codeine     Unknown reaction   Pollen Extract Cough    Metabolic Disorder Labs: Lab Results  Component Value Date   HGBA1C 5.5 07/28/2023   No results found for: PROLACTIN Lab Results  Component Value Date   CHOL 190 07/28/2023   TRIG 58.0 07/28/2023   HDL 91.30 07/28/2023   CHOLHDL 2 07/28/2023   VLDL  11.6 07/28/2023   LDLCALC 87 07/28/2023   LDLCALC 86 02/08/2023   Lab Results  Component Value Date   TSH 1.04 04/20/2023   TSH 1.21 02/08/2023    Therapeutic Level Labs: No results found for: LITHIUM No results found for: VALPROATE No results found for: CBMZ  Current Medications: Current Outpatient Medications  Medication Sig Dispense Refill   albuterol  (VENTOLIN  HFA) 108 (90 Base) MCG/ACT inhaler Inhale 2 puffs into the lungs every 6 (six) hours as needed for wheezing or shortness of breath. 8 g 2   anastrozole  (ARIMIDEX ) 1 MG tablet Take 1 tablet (1 mg total) by mouth daily. 30 tablet 11   busPIRone  (BUSPAR ) 10 MG tablet Take 1 tablet (10 mg total) by mouth 3 (three) times daily. 90 tablet 5   citalopram  (CELEXA ) 40 MG tablet Take 1 tablet (40 mg total) by mouth daily. 30 tablet 5   [START ON 12/17/2023] clonazePAM  (KLONOPIN ) 0.5 MG tablet Take 1 tablet (0.5 mg total) by mouth 2 (two) times daily. No future refills without an appointment 60 tablet 5   lamoTRIgine  (LAMICTAL ) 25 MG tablet Take 1 tablet (25 mg total) by mouth daily after lunch. 30 tablet 5   potassium chloride  (KLOR-CON ) 10 MEQ tablet Take one tablet by mouth once daily. 90 tablet 0   pravastatin  (PRAVACHOL ) 20 MG tablet Take 1 tablet (20 mg total) by mouth daily. 90 tablet 1   QUEtiapine  (SEROQUEL ) 50 MG tablet Take 1 tablet (50 mg total) by mouth at bedtime. 30 tablet 5   risperiDONE  (RISPERDAL ) 0.5 MG tablet Take 1 tablet (0.5 mg total) by mouth daily. 30 tablet 5   traZODone  (DESYREL ) 50 MG tablet Take 0.5-1 tablets (25-50 mg total) by mouth at bedtime as needed for sleep. 30 tablet 5   triamterene -hydrochlorothiazide  (MAXZIDE -25) 37.5-25 MG tablet Take 0.5 tablets by mouth daily. 30 tablet 1   No current facility-administered medications for this visit.     Musculoskeletal: Strength & Muscle Tone: within normal limits Gait & Station: normal Patient leans: N/A  Psychiatric Specialty Exam: Review of  Systems  Psychiatric/Behavioral:  The patient is nervous/anxious.     Blood pressure 112/71, pulse 91, temperature (!) 97.3 F (36.3  C), temperature source Temporal, height 4' 11 (1.499 m), weight 149 lb 9.6 oz (67.9 kg).Body mass index is 30.22 kg/m.  General Appearance: Casual  Eye Contact:  Good  Speech:  Clear and Coherent  Volume:  Normal  Mood:  Anxious managing better  Affect:  Congruent  Thought Process:  Goal Directed and Descriptions of Associations: Intact  Orientation:  Full (Time, Place, and Person)  Thought Content: Logical   Suicidal Thoughts:  No  Homicidal Thoughts:  No  Memory:  Immediate;   Fair Recent;   Poor Remote;   Poor  Judgement:  Fair  Insight:  Shallow  Psychomotor Activity:  Normal  Concentration:  Concentration: Fair and Attention Span: Fair  Recall:  Limited  Fund of Knowledge: Fair  Language: Fair  Akathisia:  No  Handed:  Right  AIMS (if indicated): done  Assets:  Communication Skills Desire for Improvement Housing Intimacy Resilience Social Support Talents/Skills Transportation  ADL's:  Intact  Cognition: limited  Sleep:  Fair   Screenings: Geneticist, Molecular Office Visit from 11/22/2023 in [facility] Office Visit from 05/17/2023 in [facility] Office Visit from 02/02/2023 in [facility] Office Visit from 09/09/2022 in [facility]  AIMS Total Score 0 0 0 0   AUDIT    Flowsheet Row Admission (Discharged) from 07/15/2022 in [facility]  Alcohol Use Disorder Identification Test Final Score (AUDIT) 0   GAD-7    Flowsheet Row Office Visit from 05/17/2023 in [facility] Counselor from 02/11/2023 in [facility] Office Visit from 09/09/2022 in [facility]  [facility] Associates Office Visit from 06/26/2022 in [facility] Office Visit from 06/10/2022 in [facility]  Total GAD-7 Score 4 8 4 21 21    Mini-Mental    Flowsheet Row Office Visit from 06/26/2022 in [facility] Clinical Support from 06/23/2016 in [facility]  Total Score (max 30 points ) 26 30   PHQ2-9    Flowsheet Row Office Visit from 08/16/2023 in [facility] Visit from 05/17/2023 in [facility] Clinical Support from 02/16/2023 in [facility] [employer] from 02/11/2023 in [facility] Office Visit from 09/09/2022 in [facility]  PHQ-2 Total Score 2 0 0 1 2  PHQ-9 Total Score 3 3 1 2 6    Flowsheet Row Office Visit from 11/22/2023 in [facility] Office Visit from 05/17/2023 in [facility] Counselor from 02/11/2023 in [facility]  C-SSRS RISK CATEGORY No Risk No Risk No Risk     Assessment and Plan: Jennifer [name] is a 78 year old Caucasian female, presented for a follow-up appointment, last appointment being 05/17/2023, patient has been noncompliant and returns to reestablish care.   1. GAD (generalized anxiety disorder)-improving Currently reports overall anxiety symptoms as manageable. Continue BuSpar  10 mg 3 times a day Continue Celexa  40 mg daily Continue Clonazepam  0.5 mg twice a day Continue psychotherapy sessions with Ms. Almarie Ligas Reviewed [place] PMP AWARxE   2. MDD (major depressive disorder), recurrent, in full remission Manage denies any significant depression symptoms Continue Celexa  and  BuSpar   as prescribed Continue Lamictal  25 mg daily after lunch Continue Seroquel  50 mg at bedtime Continue Risperidone  0.5 mg daily Continue Trazodone  25-50 mg at bedtime as needed Patient aware of long-term adverse effects of being on 2 antipsychotic medications, will consider reducing to monotherapy in the future however currently benefit outweighs risk.  3. Overuse of medication per history Currently denies any overuse or misuse of medications.  Patient's daughter has been assisting with managing medications.  4.  Memory loss-unstable Patient with significant memory problems, does have good social support system.  Needs support with ADLS as per spouse who was with her. Completed SLUMS - 8/30 Patient with history of Vitamin B12 deficiency, unknown if she is currently on B12 replacement.  Reviewed and discussed vitamin B12 level dated 06/23/2023-220. Discussed referral to neurology, as per spouse they will discuss with primary care provider. I have coordinated care with Dr. Allena Hamilton, primary care provider.    Follow-up Follow-up in clinic in 3 months or sooner if needed.   Collaboration of Care: Collaboration of Care: Referral or follow-up with counselor/therapist AEB encouraged to continue psychotherapy sessions as needed  Patient/Guardian was advised Release of Information must be obtained prior to any record release in order to collaborate their care with an outside provider. Patient/Guardian was advised if they have not already done so to contact the registration department to sign all necessary forms in order for us  to release information regarding their care.   Consent: Patient/Guardian gives verbal consent for treatment and assignment of benefits for services provided during this visit. Patient/Guardian expressed understanding and agreed to proceed.   I have spent atleast 40 minutes face to face with patient today which includes the time spent for preparing to see the patient  ( e.g., review of test, records ), obtaining and to review and separately obtained history , ordering medications and test ,psychoeducation and supportive psychotherapy and care coordination,as well as documenting clinical information in electronic health record.    Velma Agnes, MD 11/23/2023, 8:30 AM

## 2023-11-30 ENCOUNTER — Ambulatory Visit (INDEPENDENT_AMBULATORY_CARE_PROVIDER_SITE_OTHER): Admitting: Internal Medicine

## 2023-11-30 DIAGNOSIS — R413 Other amnesia: Secondary | ICD-10-CM

## 2023-11-30 DIAGNOSIS — F32A Depression, unspecified: Secondary | ICD-10-CM

## 2023-11-30 NOTE — Progress Notes (Unsigned)
 Subjective:    [name], female    DOB: 23-Oct-1945, 78 y.o.   MRN: [id number]  Patient here for No chief complaint on file.   HPI Here for a scheduled follow up - Saw psychiatry 11/22/23 - follow up regarding anxiety and depression. Continues on citalopram , lamictal , clonazepam , seroquel , risperidone  and trazodone . Concern regarding memory issues. 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 Continue stiolto. Recommended referral to cardiology - f/u right ventricular dysfunction.    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 Lynwood Nasuti   Hypertension    PONV (postoperative nausea and vomiting)    Scoliosis    Ulcer    Past Surgical History:  Procedure Laterality Date   ABDOMINAL HYSTERECTOMY     ANTERIOR CERVICAL DECOMP/DISCECTOMY FUSION N/A 04/19/2017   Procedure: Anterior Cervical Decompression Fusion Cervical Three-Four, Cervical Four-Five, Cervical Five-Six;  Surgeon: Onetha Kuba, MD;  Location: [facility] OR;  Service: Neurosurgery;  Laterality: N/A;  Anterior Cervical Decompression Fusion Cervical Three-Four, Cervical Four-Five, Cervical Five-Six   BREAST BIOPSY Right 11/14/2020   US  Bx, Venus Clip, Invasive Mammary Carcinoma   BREAST LUMPECTOMY Right 2022   With radiation   BREAST LUMPECTOMY WITH RADIOACTIVE SEED AND SENTINEL LYMPH NODE BIOPSY Right 12/27/2020   Procedure: RIGHT BREAST LUMPECTOMY WITH RADIOACTIVE SEED AND AXILLARY SENTINEL LYMPH NODE BIOPSY;  Surgeon: Ebbie Cough, MD;  Location: [facility] OR;  Service: General;  Laterality: Right;   CHOLECYSTECTOMY  1996   DILATION AND CURETTAGE OF UTERUS  1971    LAPAROSCOPIC REMOVAL OF MESENTERIC MASS     LUMBAR DISC SURGERY  1998   [facility]   PARTIAL HYSTERECTOMY  1981   prolapsed uterus   TUBAL LIGATION  1978   Family History  Problem Relation Age of Onset   Stroke Mother    Cancer Father        unknown type   Cancer Brother        blood cancer   CVA Maternal Grandmother    Healthy Son    Breast cancer Neg Hx    Colon cancer Neg Hx    Mental illness Neg Hx    Social History   Socioeconomic History   Marital status: Married    Spouse name: Ellender Burden   Number of children: 2   Years of education: Not on file   Highest education level: 12th grade  Occupational History   Occupation: retired runner, broadcasting/film/video  Tobacco Use   Smoking status: Never   Smokeless tobacco: Never  Vaping Use   Vaping status: Never Used  Substance and Sexual Activity   Alcohol use: No    Alcohol/week: 0.0 standard drinks of alcohol   Drug use: No   Sexual activity: Not Currently    Comment: not asked if sexually active  Other Topics Concern   Not on file  Social History Narrative   Right handed    Lives with husband   Had breast cancer surgery right side   Worked in the school system    Social Drivers of Dispensing Optician  Resource Strain: Low Risk  (02/16/2023)   Overall Financial Resource Strain (CARDIA)    Difficulty of Paying Living Expenses: Not hard at all  Food Insecurity: No Food Insecurity (02/16/2023)   Hunger Vital Sign    Worried About Running Out of Food in the Last Year: Never true    Ran Out of Food in the Last Year: Never true  Transportation Needs: No Transportation Needs (02/16/2023)   PRAPARE - Administrator, Civil Service (Medical): No    Lack of Transportation (Non-Medical): No  Physical Activity: Inactive (02/16/2023)   Exercise Vital Sign    Days of Exercise per Week: 0 days    Minutes of Exercise per Session: 0 min  Stress: No Stress Concern Present (02/16/2023)   [employer] of Occupational Health -  Occupational Stress Questionnaire    Feeling of Stress : Only a little  Recent Concern: Stress - Stress Concern Present (02/11/2023)   [employer] of Occupational Health - Occupational Stress Questionnaire    Feeling of Stress : To some extent  Social Connections: Moderately Integrated (02/16/2023)   Social Connection and Isolation Panel    Frequency of Communication with Friends and Family: More than three times a week    Frequency of Social Gatherings with Friends and Family: Once a week    Attends Religious Services: More than 4 times per year    Active Member of [employer] or Organizations: No    Attends Banker Meetings: Never    Marital Status: Married     Review of Systems     Objective:     There were no vitals taken for this visit. Wt Readings from Last 3 Encounters:  11/11/23 150 lb 3.2 oz (68.1 kg)  08/16/23 158 lb (71.7 kg)  07/28/23 153 lb 6.4 oz (69.6 kg)    Physical Exam  {Perform Simple Foot Exam  Perform Detailed exam:1} {Insert foot Exam (Optional):30965}   Outpatient Encounter Medications as of 11/30/2023  Medication Sig   albuterol  (VENTOLIN  HFA) 108 (90 Base) MCG/ACT inhaler Inhale 2 puffs into the lungs every 6 (six) hours as needed for wheezing or shortness of breath.   anastrozole  (ARIMIDEX ) 1 MG tablet Take 1 tablet (1 mg total) by mouth daily.   busPIRone  (BUSPAR ) 10 MG tablet Take 1 tablet (10 mg total) by mouth 3 (three) times daily.   citalopram  (CELEXA ) 40 MG tablet Take 1 tablet (40 mg total) by mouth daily.   [START ON 12/17/2023] clonazePAM  (KLONOPIN ) 0.5 MG tablet Take 1 tablet (0.5 mg total) by mouth 2 (two) times daily. No future refills without an appointment   lamoTRIgine  (LAMICTAL ) 25 MG tablet Take 1 tablet (25 mg total) by mouth daily after lunch.   potassium chloride  (KLOR-CON ) 10 MEQ tablet Take one tablet by mouth once daily.   pravastatin  (PRAVACHOL ) 20 MG tablet Take 1 tablet (20 mg total) by mouth daily.    QUEtiapine  (SEROQUEL ) 50 MG tablet Take 1 tablet (50 mg total) by mouth at bedtime.   risperiDONE  (RISPERDAL ) 0.5 MG tablet Take 1 tablet (0.5 mg total) by mouth daily.   traZODone  (DESYREL ) 50 MG tablet Take 0.5-1 tablets (25-50 mg total) by mouth at bedtime as needed for sleep.   triamterene -hydrochlorothiazide  (MAXZIDE -25) 37.5-25 MG tablet Take 0.5 tablets by mouth daily.   No facility-administered encounter medications on file as of 11/30/2023.     Lab Results  Component Value Date   WBC 8.0 08/16/2023   HGB 12.1 08/16/2023  HCT 36.5 08/16/2023   PLT 207.0 08/16/2023   GLUCOSE 84 08/16/2023   CHOL 190 07/28/2023   TRIG 58.0 07/28/2023   HDL 91.30 07/28/2023   LDLDIRECT 148.4 11/03/2012   LDLCALC 87 07/28/2023   ALT 10 07/28/2023   AST 19 07/28/2023   NA 141 08/16/2023   K 3.9 08/16/2023   CL 99 08/16/2023   CREATININE 0.90 08/16/2023   BUN 17 08/16/2023   CO2 35 (H) 08/16/2023   TSH 1.04 04/20/2023   HGBA1C 5.5 07/28/2023    CT CHEST HIGH RESOLUTION Result Date: 07/07/2023 CLINICAL DATA:  Shortness of breath, restrictive lung disease. Breast cancer. EXAM: CT CHEST WITHOUT CONTRAST TECHNIQUE: Multidetector CT imaging of the chest was performed following the standard protocol without intravenous contrast. High resolution imaging of the lungs, as well as inspiratory and expiratory imaging, was performed. RADIATION DOSE REDUCTION: This exam was performed according to the departmental dose-optimization program which includes automated exposure control, adjustment of the mA and/or kV according to patient size and/or use of iterative reconstruction technique. COMPARISON:  11/08/2014. FINDINGS: Cardiovascular: Atherosclerotic calcification of the aorta. Heart is at the upper limits of normal in size. No pericardial effusion. Mediastinum/Nodes: 1.3 cm low-attenuation left thyroid  nodule. No follow-up recommended. (Ref: J Am Coll Radiol. 2015 Feb;12(2): 143-50).Scarring Lungs/Pleura:  Negative for subpleural reticulation, traction bronchiectasis/bronchiolectasis, ground glass, architectural distortion or honeycombing. Scattered bibasilar scarring. Calcified granulomas. Ground-glass, central volume loss and mild architectural distortion in the left lower lobe. Upper Abdomen: Cholecystectomy. Visualized portions of the liver, adrenal glands, kidneys, spleen, pancreas, stomach and bowel are otherwise grossly unremarkable. No upper abdominal adenopathy. Musculoskeletal: Severe reverse S shaped scoliosis with associated distortion of the left ribs and left hemithorax. Degenerative changes in the spine. IMPRESSION: 1. No evidence of interstitial lung disease. 2. Ground-glass and central volume loss in the left lower lobe is of uncertain acuity but postinfectious/postinflammatory scarring is favored. 3.  Aortic atherosclerosis (ICD10-I70.0). Electronically Signed   By: Newell Eke M.D.   On: 07/07/2023 11:12       Assessment & Plan:  There are no diagnoses linked to this encounter.   Allena Hamilton, MD

## 2023-12-01 ENCOUNTER — Encounter: Payer: Self-pay | Admitting: Internal Medicine

## 2023-12-01 NOTE — Progress Notes (Signed)
 [name], female   DOB: 1945-10-08, 78 y.o.   MRN: [id number] Did not show for appt.

## 2023-12-24 ENCOUNTER — Other Ambulatory Visit: Payer: Self-pay

## 2023-12-24 ENCOUNTER — Emergency Department (HOSPITAL_COMMUNITY)

## 2023-12-24 ENCOUNTER — Inpatient Hospital Stay (HOSPITAL_COMMUNITY)
Admission: EM | Admit: 2023-12-24 | Discharge: 2023-12-29 | DRG: 557 | Disposition: A | Discharge status: Skilled Nursing Facility | Attending: Internal Medicine | Admitting: Internal Medicine

## 2023-12-24 ENCOUNTER — Encounter (HOSPITAL_COMMUNITY): Payer: Self-pay

## 2023-12-24 DIAGNOSIS — Z885 Allergy status to narcotic agent status: Secondary | ICD-10-CM | POA: Diagnosis not present

## 2023-12-24 DIAGNOSIS — Z9049 Acquired absence of other specified parts of digestive tract: Secondary | ICD-10-CM | POA: Diagnosis not present

## 2023-12-24 DIAGNOSIS — R413 Other amnesia: Secondary | ICD-10-CM | POA: Diagnosis present

## 2023-12-24 DIAGNOSIS — N179 Acute kidney failure, unspecified: Secondary | ICD-10-CM | POA: Diagnosis present

## 2023-12-24 DIAGNOSIS — E860 Dehydration: Secondary | ICD-10-CM | POA: Diagnosis present

## 2023-12-24 DIAGNOSIS — I10 Essential (primary) hypertension: Secondary | ICD-10-CM | POA: Diagnosis present

## 2023-12-24 DIAGNOSIS — F32A Depression, unspecified: Secondary | ICD-10-CM | POA: Diagnosis present

## 2023-12-24 DIAGNOSIS — R7989 Other specified abnormal findings of blood chemistry: Secondary | ICD-10-CM | POA: Diagnosis not present

## 2023-12-24 DIAGNOSIS — Z923 Personal history of irradiation: Secondary | ICD-10-CM | POA: Diagnosis not present

## 2023-12-24 DIAGNOSIS — K219 Gastro-esophageal reflux disease without esophagitis: Secondary | ICD-10-CM | POA: Diagnosis present

## 2023-12-24 DIAGNOSIS — T796XXA Traumatic ischemia of muscle, initial encounter: Secondary | ICD-10-CM

## 2023-12-24 DIAGNOSIS — M6282 Rhabdomyolysis: Secondary | ICD-10-CM | POA: Diagnosis present

## 2023-12-24 DIAGNOSIS — I5A Non-ischemic myocardial injury (non-traumatic): Secondary | ICD-10-CM | POA: Diagnosis present

## 2023-12-24 DIAGNOSIS — E876 Hypokalemia: Secondary | ICD-10-CM | POA: Diagnosis present

## 2023-12-24 DIAGNOSIS — G20C Parkinsonism, unspecified: Secondary | ICD-10-CM | POA: Diagnosis present

## 2023-12-24 DIAGNOSIS — R0602 Shortness of breath: Secondary | ICD-10-CM | POA: Diagnosis not present

## 2023-12-24 DIAGNOSIS — J4489 Other specified chronic obstructive pulmonary disease: Secondary | ICD-10-CM | POA: Diagnosis present

## 2023-12-24 DIAGNOSIS — J45909 Unspecified asthma, uncomplicated: Secondary | ICD-10-CM | POA: Diagnosis present

## 2023-12-24 DIAGNOSIS — Z79811 Long term (current) use of aromatase inhibitors: Secondary | ICD-10-CM | POA: Diagnosis not present

## 2023-12-24 DIAGNOSIS — J984 Other disorders of lung: Secondary | ICD-10-CM

## 2023-12-24 DIAGNOSIS — Z853 Personal history of malignant neoplasm of breast: Secondary | ICD-10-CM | POA: Diagnosis not present

## 2023-12-24 DIAGNOSIS — R296 Repeated falls: Secondary | ICD-10-CM | POA: Diagnosis present

## 2023-12-24 DIAGNOSIS — Z888 Allergy status to other drugs, medicaments and biological substances status: Secondary | ICD-10-CM | POA: Diagnosis not present

## 2023-12-24 DIAGNOSIS — E785 Hyperlipidemia, unspecified: Secondary | ICD-10-CM | POA: Diagnosis present

## 2023-12-24 DIAGNOSIS — F0283 Dementia in other diseases classified elsewhere, unspecified severity, with mood disturbance: Secondary | ICD-10-CM | POA: Diagnosis present

## 2023-12-24 DIAGNOSIS — Z823 Family history of stroke: Secondary | ICD-10-CM | POA: Diagnosis not present

## 2023-12-24 DIAGNOSIS — Z90711 Acquired absence of uterus with remaining cervical stump: Secondary | ICD-10-CM | POA: Diagnosis not present

## 2023-12-24 DIAGNOSIS — Z79899 Other long term (current) drug therapy: Secondary | ICD-10-CM | POA: Diagnosis not present

## 2023-12-24 DIAGNOSIS — J9611 Chronic respiratory failure with hypoxia: Secondary | ICD-10-CM | POA: Diagnosis present

## 2023-12-24 DIAGNOSIS — G9341 Metabolic encephalopathy: Secondary | ICD-10-CM | POA: Diagnosis present

## 2023-12-24 LAB — I-STAT VENOUS BLOOD GAS, ED
Acid-Base Excess: 4 mmol/L — ABNORMAL HIGH (ref 0.0–2.0)
Bicarbonate: 30.3 mmol/L — ABNORMAL HIGH (ref 20.0–28.0)
Calcium, Ion: 1.13 mmol/L — ABNORMAL LOW (ref 1.15–1.40)
HCT: 39 % (ref 36.0–46.0)
Hemoglobin: 13.3 g/dL (ref 12.0–15.0)
O2 Saturation: 90 %
Potassium: 3.1 mmol/L — ABNORMAL LOW (ref 3.5–5.1)
Sodium: 139 mmol/L (ref 135–145)
TCO2: 32 mmol/L (ref 22–32)
pCO2, Ven: 52.1 mmHg (ref 44–60)
pH, Ven: 7.373 (ref 7.25–7.43)
pO2, Ven: 62 mmHg — ABNORMAL HIGH (ref 32–45)

## 2023-12-24 LAB — COMPREHENSIVE METABOLIC PANEL WITH GFR
ALT: 35 U/L (ref 0–44)
AST: 163 U/L — ABNORMAL HIGH (ref 15–41)
Albumin: 3.8 g/dL (ref 3.5–5.0)
Alkaline Phosphatase: 40 U/L (ref 38–126)
Anion gap: 13 (ref 5–15)
BUN: 19 mg/dL (ref 8–23)
CO2: 29 mmol/L (ref 22–32)
Calcium: 8.8 mg/dL — ABNORMAL LOW (ref 8.9–10.3)
Chloride: 99 mmol/L (ref 98–111)
Creatinine, Ser: 1.05 mg/dL — ABNORMAL HIGH (ref 0.44–1.00)
GFR, Estimated: 55 mL/min — ABNORMAL LOW (ref >60)
Glucose, Bld: 108 mg/dL — ABNORMAL HIGH (ref 70–99)
Potassium: 3.3 mmol/L — ABNORMAL LOW (ref 3.5–5.1)
Sodium: 141 mmol/L (ref 135–145)
Total Bilirubin: 1 mg/dL (ref 0.0–1.2)
Total Protein: 6.5 g/dL (ref 6.5–8.1)

## 2023-12-24 LAB — TROPONIN I (HIGH SENSITIVITY)
Troponin I (High Sensitivity): 220 ng/L (ref <18)
Troponin I (High Sensitivity): 207 ng/L (ref <18)

## 2023-12-24 LAB — CBC
HCT: 40.9 % (ref 36.0–46.0)
HCT: 36.1 % (ref 36.0–46.0)
Hemoglobin: 13.1 g/dL (ref 12.0–15.0)
Hemoglobin: 11.9 g/dL — ABNORMAL LOW (ref 12.0–15.0)
MCH: 31.2 pg (ref 26.0–34.0)
MCH: 32 pg (ref 26.0–34.0)
MCHC: 32 g/dL (ref 30.0–36.0)
MCHC: 33 g/dL (ref 30.0–36.0)
MCV: 97.4 fL (ref 80.0–100.0)
MCV: 97 fL (ref 80.0–100.0)
Platelets: 195 K/uL (ref 150–400)
Platelets: 163 K/uL (ref 150–400)
RBC: 4.2 MIL/uL (ref 3.87–5.11)
RBC: 3.72 MIL/uL — ABNORMAL LOW (ref 3.87–5.11)
RDW: 12.6 % (ref 11.5–15.5)
RDW: 12.6 % (ref 11.5–15.5)
WBC: 9.9 K/uL (ref 4.0–10.5)
WBC: 10 K/uL (ref 4.0–10.5)
nRBC: 0 % (ref 0.0–0.2)
nRBC: 0 % (ref 0.0–0.2)

## 2023-12-24 LAB — URINALYSIS, W/ REFLEX TO CULTURE (INFECTION SUSPECTED)
Bilirubin Urine: NEGATIVE
Glucose, UA: NEGATIVE mg/dL
Ketones, ur: 5 mg/dL — AB
Leukocytes,Ua: NEGATIVE
Nitrite: NEGATIVE
Protein, ur: 100 mg/dL — AB
Specific Gravity, Urine: 1.019 (ref 1.005–1.030)
pH: 6 (ref 5.0–8.0)

## 2023-12-24 LAB — PROTIME-INR
INR: 1 (ref 0.8–1.2)
Prothrombin Time: 13.7 s (ref 11.4–15.2)

## 2023-12-24 LAB — CREATININE, SERUM
Creatinine, Ser: 0.88 mg/dL (ref 0.44–1.00)
GFR, Estimated: >60 mL/min (ref >60)

## 2023-12-24 LAB — LIPASE, BLOOD: Lipase: 23 U/L (ref 11–51)

## 2023-12-24 LAB — I-STAT CG4 LACTIC ACID, ED: Lactic Acid, Venous: 1.5 mmol/L (ref 0.5–1.9)

## 2023-12-24 LAB — BRAIN NATRIURETIC PEPTIDE: B Natriuretic Peptide: 109.3 pg/mL — ABNORMAL HIGH (ref 0.0–100.0)

## 2023-12-24 LAB — CBG MONITORING, ED: Glucose-Capillary: 96 mg/dL (ref 70–99)

## 2023-12-24 LAB — CK: Total CK: 7483 U/L — ABNORMAL HIGH (ref 38–234)

## 2023-12-24 MED ORDER — ONDANSETRON HCL 4 MG PO TABS: 4.0000 mg | ORAL_TABLET | Freq: Four times a day (QID) | ORAL | Status: DC | PRN

## 2023-12-24 MED ORDER — ALBUTEROL SULFATE (2.5 MG/3ML) 0.083% IN NEBU: 2.5000 mg | INHALATION_SOLUTION | RESPIRATORY_TRACT | Status: DC | PRN

## 2023-12-24 MED ORDER — ENOXAPARIN SODIUM 40 MG/0.4ML IJ SOSY
40.0000 mg | PREFILLED_SYRINGE | INTRAMUSCULAR | Status: DC
  Administered 2023-12-24 – 2023-12-28 (×5): 40 mg via SUBCUTANEOUS
  Filled 2023-12-24 (×5): qty 0.4

## 2023-12-24 MED ORDER — CLONAZEPAM 0.5 MG PO TABS
0.5000 mg | ORAL_TABLET | Freq: Two times a day (BID) | ORAL | Status: DC
  Administered 2023-12-24 – 2023-12-25 (×3): 0.5 mg via ORAL
  Filled 2023-12-24 (×4): qty 1

## 2023-12-24 MED ORDER — LAMOTRIGINE 25 MG PO TABS
25.0000 mg | ORAL_TABLET | Freq: Every day | ORAL | Status: DC
  Administered 2023-12-25 – 2023-12-29 (×5): 25 mg via ORAL
  Filled 2023-12-24 (×5): qty 1

## 2023-12-24 MED ORDER — POTASSIUM CHLORIDE ER 10 MEQ PO TBCR
10.0000 meq | EXTENDED_RELEASE_TABLET | Freq: Every day | ORAL | Status: DC
  Administered 2023-12-24 – 2023-12-29 (×6): 10 meq via ORAL
  Filled 2023-12-24 (×8): qty 1

## 2023-12-24 MED ORDER — OXYCODONE HCL 5 MG PO TABS
5.0000 mg | ORAL_TABLET | ORAL | Status: DC | PRN
  Administered 2023-12-27 – 2023-12-29 (×2): 5 mg via ORAL
  Filled 2023-12-24 (×4): qty 1

## 2023-12-24 MED ORDER — ONDANSETRON HCL 4 MG/2ML IJ SOLN: 4.0000 mg | Freq: Four times a day (QID) | INTRAMUSCULAR | Status: DC | PRN

## 2023-12-24 MED ORDER — KCL-LACTATED RINGERS-D5W 20 MEQ/L IV SOLN
INTRAVENOUS | Status: AC
  Filled 2023-12-24 (×3): qty 1000

## 2023-12-24 MED ORDER — ALBUTEROL SULFATE HFA 108 (90 BASE) MCG/ACT IN AERS: 2.0000 | INHALATION_SPRAY | Freq: Four times a day (QID) | RESPIRATORY_TRACT | Status: DC | PRN

## 2023-12-24 MED ORDER — CITALOPRAM HYDROBROMIDE 20 MG PO TABS
40.0000 mg | ORAL_TABLET | Freq: Every day | ORAL | Status: DC
  Administered 2023-12-25 – 2023-12-26 (×2): 40 mg via ORAL
  Filled 2023-12-24 (×2): qty 2

## 2023-12-24 MED ORDER — BUSPIRONE HCL 5 MG PO TABS
10.0000 mg | ORAL_TABLET | Freq: Three times a day (TID) | ORAL | Status: DC
  Administered 2023-12-24 – 2023-12-29 (×14): 10 mg via ORAL
  Filled 2023-12-24 (×14): qty 2

## 2023-12-24 MED ORDER — TRAZODONE HCL 50 MG PO TABS
25.0000 mg | ORAL_TABLET | Freq: Every evening | ORAL | Status: DC | PRN
  Administered 2023-12-25: 50 mg via ORAL
  Filled 2023-12-24: qty 1

## 2023-12-24 MED ORDER — LACTATED RINGERS IV BOLUS
1000.0000 mL | Freq: Once | INTRAVENOUS | Status: AC
  Administered 2023-12-24: 1000 mL via INTRAVENOUS

## 2023-12-24 MED ORDER — PRAVASTATIN SODIUM 10 MG PO TABS
20.0000 mg | ORAL_TABLET | Freq: Every day | ORAL | Status: DC
  Administered 2023-12-25: 20 mg via ORAL
  Filled 2023-12-24: qty 2

## 2023-12-24 MED ORDER — QUETIAPINE FUMARATE 50 MG PO TABS
50.0000 mg | ORAL_TABLET | Freq: Every day | ORAL | Status: DC
  Administered 2023-12-24 – 2023-12-28 (×5): 50 mg via ORAL
  Filled 2023-12-24 (×5): qty 1

## 2023-12-24 MED ORDER — ANASTROZOLE 1 MG PO TABS
1.0000 mg | ORAL_TABLET | Freq: Every day | ORAL | Status: DC
  Administered 2023-12-25 – 2023-12-26 (×2): 1 mg via ORAL
  Filled 2023-12-24 (×2): qty 1

## 2023-12-24 MED ORDER — RISPERIDONE 0.5 MG PO TABS
0.5000 mg | ORAL_TABLET | Freq: Every day | ORAL | Status: DC
  Administered 2023-12-24 – 2023-12-29 (×6): 0.5 mg via ORAL
  Filled 2023-12-24 (×6): qty 1

## 2023-12-24 NOTE — Plan of Care (Signed)

## 2023-12-24 NOTE — H&P (Signed)
 History and Physical    Patient: Jennifer [name] FMW:[id number] DOB: 1945-01-30 DOA: 12/24/2023 DOS: the patient was seen and examined on 12/24/2023 PCP: Glendia Shad, MD  Patient coming from: Home  Chief Complaint:  Chief Complaint  Patient presents with   Altered Mental Status   Weakness   HPI: Jennifer [name] is a 78 y.o. female with medical history significant of COPD, GERD, depression, asthma, panic attacks, essential hypertension, scoliosis who was brought in by family from home secondary to altered mental status.  Patient was apparently acting different and possibly has had multiple falls.  She is disoriented to time and situation.  Patient mainly complained of bilateral leg pain and weakness.  She wears oxygen  at home as needed.  She has early dementia and memory changes apparently.  Family at bedside including husband who is also unable to give adequate history.  In the ER patient appears to be markedly dehydrated.  Has some AKI, hypokalemia and CPK of more than 7000.  At this point patient suspected to have a fall with rhabdomyolysis.  Has generalized weakness.  Patient being admitted for possibly traumatic rhabdomyolysis, dehydration and AKI.  Review of Systems: As mentioned in the history of present illness. All other systems reviewed and are negative. 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 Lynwood Nasuti   Hypertension    PONV (postoperative nausea and vomiting)    Scoliosis    Ulcer    Past Surgical History:  Procedure Laterality Date   ABDOMINAL HYSTERECTOMY     ANTERIOR CERVICAL DECOMP/DISCECTOMY FUSION N/A 04/19/2017   Procedure: Anterior Cervical  Decompression Fusion Cervical Three-Four, Cervical Four-Five, Cervical Five-Six;  Surgeon: Onetha Kuba, MD;  Location: [facility] OR;  Service: Neurosurgery;  Laterality: N/A;  Anterior Cervical Decompression Fusion Cervical Three-Four, Cervical Four-Five, Cervical Five-Six   BREAST BIOPSY Right 11/14/2020   US  Bx, Venus Clip, Invasive Mammary Carcinoma   BREAST LUMPECTOMY Right 2022   With radiation   BREAST LUMPECTOMY WITH RADIOACTIVE SEED AND SENTINEL LYMPH NODE BIOPSY Right 12/27/2020   Procedure: RIGHT BREAST LUMPECTOMY WITH RADIOACTIVE SEED AND AXILLARY SENTINEL LYMPH NODE BIOPSY;  Surgeon: Ebbie Cough, MD;  Location: [facility] OR;  Service: General;  Laterality: Right;   CHOLECYSTECTOMY  1996   DILATION AND CURETTAGE OF UTERUS  1971   LAPAROSCOPIC REMOVAL OF MESENTERIC MASS     LUMBAR DISC SURGERY  1998   [facility]   PARTIAL HYSTERECTOMY  1981   prolapsed uterus   TUBAL LIGATION  1978   Social History:  reports that she has never smoked. She has never used smokeless tobacco. She reports that she does not drink alcohol and does not use drugs.  Allergies  Allergen Reactions   Advair Diskus [Fluticasone -Salmeterol] Other (See Comments)    Causes blisters in her mouth   Codeine     Unknown reaction   Pollen Extract Cough    Family History  Problem Relation Age of Onset   Stroke Mother    Cancer Father        unknown type   Cancer Brother  blood cancer   CVA Maternal Grandmother    Healthy Son    Breast cancer Neg Hx    Colon cancer Neg Hx    Mental illness Neg Hx     Prior to Admission medications   Medication Sig Start Date End Date Taking? Authorizing Provider  albuterol  (VENTOLIN  HFA) 108 (90 Base) MCG/ACT inhaler Inhale 2 puffs into the lungs every 6 (six) hours as needed for wheezing or shortness of breath. 01/21/23   Glendia Shad, MD  anastrozole  (ARIMIDEX ) 1 MG tablet Take 1 tablet (1 mg total) by mouth daily. 03/23/22   Crawford Morna Pickle, NP  busPIRone  (BUSPAR )  10 MG tablet Take 1 tablet (10 mg total) by mouth 3 (three) times daily. 11/22/23   Eappen, Saramma, MD  citalopram  (CELEXA ) 40 MG tablet Take 1 tablet (40 mg total) by mouth daily. 11/22/23   Eappen, Saramma, MD  clonazePAM  (KLONOPIN ) 0.5 MG tablet Take 1 tablet (0.5 mg total) by mouth 2 (two) times daily. No future refills without an appointment 12/17/23 06/14/24  Eappen, Saramma, MD  lamoTRIgine  (LAMICTAL ) 25 MG tablet Take 1 tablet (25 mg total) by mouth daily after lunch. 11/22/23   Eappen, Saramma, MD  potassium chloride  (KLOR-CON ) 10 MEQ tablet Take one tablet by mouth once daily. 11/10/23   Glendia Shad, MD  pravastatin  (PRAVACHOL ) 20 MG tablet Take 1 tablet (20 mg total) by mouth daily. 09/23/23   Glendia Shad, MD  QUEtiapine  (SEROQUEL ) 50 MG tablet Take 1 tablet (50 mg total) by mouth at bedtime. 11/22/23   Eappen, Saramma, MD  risperiDONE  (RISPERDAL ) 0.5 MG tablet Take 1 tablet (0.5 mg total) by mouth daily. 11/22/23   Eappen, Saramma, MD  traZODone  (DESYREL ) 50 MG tablet Take 0.5-1 tablets (25-50 mg total) by mouth at bedtime as needed for sleep. 11/22/23   Eappen, Saramma, MD  triamterene -hydrochlorothiazide  (MAXZIDE -25) 37.5-25 MG tablet Take 0.5 tablets by mouth daily. 07/30/23   Glendia Shad, MD    Physical Exam: Vitals:   12/24/23 1630 12/24/23 1715 12/24/23 1730 12/24/23 1800  BP: 129/64 131/76 124/70 122/72  Pulse: 78 91 88 90  Resp: 17 (!) 21 (!) 22 19  Temp:      TempSrc:      SpO2: 100% 100% 97% 93%   Constitutional: Chronically ill looking, confused, NAD, calm, comfortable Eyes: PERRL, lids and conjunctivae normal ENMT: Mucous membranes are dry. Posterior pharynx clear of any exudate or lesions.Normal dentition.  Neck: normal, supple, no masses, no thyromegaly Respiratory: clear to auscultation bilaterally, no wheezing, no crackles. Normal respiratory effort. No accessory muscle use.  Cardiovascular: Regular rate and rhythm, no murmurs / rubs / gallops. No extremity  edema. 2+ pedal pulses. No carotid bruits.  Abdomen: no tenderness, no masses palpated. No hepatosplenomegaly. Bowel sounds positive.  Musculoskeletal: Good range of motion, no joint swelling or tenderness, Skin: no rashes, lesions, ulcers. No induration Neurologic: CN 2-12 grossly intact. Sensation intact, DTR normal. Strength 5/5 in all 4.  Psychiatric: Confused, disoriented to person place  Data Reviewed:  Temperature 98.7, blood pressure 134/59, pulse 91 respiratory 25, oxygen  sat 98% on room air.  Venous pH 7.37.  Potassium is 3.3 glucose 108 creatinine 1.08 calcium 8.8.  AST was 163.  GFR of 55.  Troponin 220 and then 207.  CBC within normal urinalysis showed hazy urine with moderate hemoglobin WBC 6-10 no bacteria.  CT of the cervical spine and head were normal.  Chest x-ray showed increased or new most likely 1 like opacity most  likely atelectasis.  There is stable volume loss and scarring in the left lung base severe dextroconvex thoracic and levoconvex lumbar scoliosis  Assessment and Plan:  1 acute rhabdomyolysis: Most likely traumatic.  Patient came from home.  Suspected fall.  Admit the patient.  Aggressive hydration.  Follow CPK levels.  Elevated troponin is more than likely from the rhabdomyolysis.  Will follow.  #2 elevated troponins: Most likely secondary to rhabdomyolysis.  It is Plavix.  Hydrate.  Recheck.  #3 suspected fall: Will get PT and OT evaluation  #4 history of asthma: No acute exacerbation.  Continue to monitor  #5 restrictive lung disease: Patient on room air and doing well.  Continue to monitor  #6 AKI: Hydrate and monitor renal function.  Most likely prerenal  #7 GERD: On resume home  #8 dehydration: Likely poor oral intake.  Hydrate monitor  #9 essential hypertension: Blood pressure.  Stable now.  Will resume home regimen    Advance Care Planning:   Code Status: Prior full code  Consults: None  Family Communication: Husband at bedside  Severity  of Illness: The appropriate patient status for this patient is INPATIENT. Inpatient status is judged to be reasonable and necessary in order to provide the required intensity of service to ensure the patient's safety. The patient's presenting symptoms, physical exam findings, and initial radiographic and laboratory data in the context of their chronic comorbidities is felt to place them at high risk for further clinical deterioration. Furthermore, it is not anticipated that the patient will be medically stable for discharge from the hospital within 2 midnights of admission.   * I certify that at the point of admission it is my clinical judgment that the patient will require inpatient hospital care spanning beyond 2 midnights from the point of admission due to high intensity of service, high risk for further deterioration and high frequency of surveillance required.*  AuthorBETHA SIM KNOLL, MD 12/24/2023 6:30 PM  For on call review [email or web address].

## 2023-12-24 NOTE — ED Triage Notes (Addendum)
 Pt BIB EMS from home, family said pt is normally independent AOX4 but for past 2 days she has been altered acting different and possibly had a few falls. Pt not on thinners. Disoriented to time and situation on arrival. C/o bilateral leg pain/weakness. Pt wears oxygen  at home as needed.

## 2023-12-24 NOTE — ED Notes (Signed)
Phlebotomy at bedside for lab work.

## 2023-12-24 NOTE — ED Notes (Signed)
 Patient transported to CT

## 2023-12-24 NOTE — ED Provider Notes (Signed)
 [place] [facility] Provider Note   CSN: [id number] Arrival date & time: 12/24/23  1440     Patient presents with: Altered Mental Status and Weakness   Jennifer [name] is a 78 y.o. female.  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  History per patient and family.  Endorses usually oriented x 4, primarily independent, over the past 2 days has been acting more different, concern for several falls as well.  Patient not currently on thinners.  Oriented to self only today.  Complaining of bilateral leg pain and weakness.  Family states that over the past 3 weeks, patient has been having worsening memory however has continued to remain oriented x 4, and usually able to answer questions appropriately.  Over the past 2 days she has had significant weakness bilateral lower extremities, and yesterday family reports that patient laid down on the floor next to her bed, and stated that she wanted to stay there on the floor.  This was witnessed, and family reports that she laid there for many hours, which started yesterday morning until she was helped up today.  Denies any head injury associated with this episode, she did not fall she was helped onto the floor.  They were having significant difficulty trying to get her up, she continued to remain very weak.  Denies any episodes of vomiting.  Denies any cough or congestion.  Family is concerned that she may have a UTI, she has been complaining of some burning with urination reportedly.    Altered Mental Status Associated symptoms: weakness   Weakness      Prior to Admission medications   Medication Sig Start Date End Date Taking? Authorizing Provider  albuterol  (VENTOLIN  HFA) 108 (90 Base) MCG/ACT inhaler Inhale 2 puffs into the lungs every 6 (six) hours as  needed for wheezing or shortness of breath. 01/21/23   Glendia Shad, MD  anastrozole  (ARIMIDEX ) 1 MG tablet Take 1 tablet (1 mg total) by mouth daily. 03/23/22   Crawford Morna Pickle, NP  busPIRone  (BUSPAR ) 10 MG tablet Take 1 tablet (10 mg total) by mouth 3 (three) times daily. 11/22/23   Eappen, Saramma, MD  citalopram  (CELEXA ) 40 MG tablet Take 1 tablet (40 mg total) by mouth daily. 11/22/23   Eappen, Saramma, MD  clonazePAM  (KLONOPIN ) 0.5 MG tablet Take 1 tablet (0.5 mg total) by mouth 2 (two) times daily. No future refills without an appointment 12/17/23 06/14/24  Eappen, Saramma, MD  lamoTRIgine  (LAMICTAL ) 25 MG tablet Take 1 tablet (25 mg total) by mouth daily after lunch. 11/22/23   Eappen, Saramma, MD  potassium chloride  (KLOR-CON ) 10 MEQ tablet Take one tablet by mouth once daily. 11/10/23   Glendia Shad, MD  pravastatin  (PRAVACHOL ) 20 MG tablet Take 1 tablet (20 mg total) by mouth daily. 09/23/23   Glendia Shad, MD  QUEtiapine  (SEROQUEL ) 50 MG tablet Take 1 tablet (50 mg total) by mouth at bedtime. 11/22/23   Eappen, Saramma, MD  risperiDONE  (RISPERDAL ) 0.5 MG tablet Take 1 tablet (0.5 mg total) by mouth daily. 11/22/23   Eappen, Saramma, MD  traZODone  (DESYREL ) 50 MG tablet Take 0.5-1 tablets (25-50 mg total) by mouth at bedtime as needed for sleep. 11/22/23   Eappen, Saramma, MD  triamterene -hydrochlorothiazide  (MAXZIDE -25) 37.5-25 MG tablet Take 0.5 tablets by mouth daily. 07/30/23   Glendia Shad, MD  Allergies: Advair diskus [fluticasone -salmeterol], Codeine, and Pollen extract    Review of Systems  Neurological:  Positive for weakness.    Updated Vital Signs BP 133/65 (BP Location: Left Arm)   Pulse 84   Temp 98.2 F (36.8 C) (Oral)   Resp 16   SpO2 96%   Physical Exam Vitals and nursing note reviewed.  Constitutional:      General: She is not in acute distress.    Appearance: She is ill-appearing.     Comments: Oriented x 2, states that it is February, unsure  what year it is.  She knows that she is at [facility] and can tell me her name.  HENT:     Head: Normocephalic and atraumatic.     Mouth/Throat:     Mouth: Mucous membranes are dry.     Pharynx: Oropharynx is clear.  Eyes:     Extraocular Movements: Extraocular movements intact.     Conjunctiva/sclera: Conjunctivae normal.     Pupils: Pupils are equal, round, and reactive to light.  Cardiovascular:     Rate and Rhythm: Normal rate and regular rhythm.     Pulses: Normal pulses.     Heart sounds: Normal heart sounds. No murmur heard.    No gallop.  Pulmonary:     Effort: Pulmonary effort is normal. No respiratory distress.     Breath sounds: Normal breath sounds. No stridor. No wheezing, rhonchi or rales.  Abdominal:     General: Abdomen is flat. There is no distension.     Palpations: Abdomen is soft.     Tenderness: There is no abdominal tenderness. There is no right CVA tenderness, left CVA tenderness, guarding or rebound.  Musculoskeletal:        General: No swelling, tenderness, deformity or signs of injury.     Cervical back: No rigidity.  Skin:    General: Skin is warm.     Capillary Refill: Capillary refill takes 2 to 3 seconds.  Neurological:     Mental Status: She is alert.     Cranial Nerves: No cranial nerve deficit.     Comments: Able to move all extremities, 4/5 strength of the right lower extremity in comparison to the left leg, which I would more consider 4+/5.  4+/5 strength bilateral upper extremities.  Patient unable to cooperate with coordination or sensory exam.  No facial droop appreciated, cranial nerve II through XII intact.  No gaze deviation, no nystagmus appreciated. Unable to perform gait evaluation secondary to patient significant weakness.     (all labs ordered are listed, but only abnormal results are displayed) Labs Reviewed  COMPREHENSIVE METABOLIC PANEL WITH GFR - Abnormal; Notable for the following components:      Result Value   Potassium 3.3  (*)    Glucose, Bld 108 (*)    Creatinine, Ser 1.05 (*)    Calcium 8.8 (*)    AST 163 (*)    GFR, Estimated 55 (*)    All other components within normal limits  CK - Abnormal; Notable for the following components:   Total CK 7,483 (*)    All other components within normal limits  BRAIN NATRIURETIC PEPTIDE - Abnormal; Notable for the following components:   B Natriuretic Peptide 109.3 (*)    All other components within normal limits  URINALYSIS, W/ REFLEX TO CULTURE (INFECTION SUSPECTED) - Abnormal; Notable for the following components:   APPearance HAZY (*)    Hgb urine dipstick MODERATE (*)  Ketones, ur 5 (*)    Protein, ur 100 (*)    Bacteria, UA RARE (*)    All other components within normal limits  CBC - Abnormal; Notable for the following components:   RBC 3.72 (*)    Hemoglobin 11.9 (*)    All other components within normal limits  I-STAT VENOUS BLOOD GAS, ED - Abnormal; Notable for the following components:   pO2, Ven 62 (*)    Bicarbonate 30.3 (*)    Acid-Base Excess 4.0 (*)    Potassium 3.1 (*)    Calcium, Ion 1.13 (*)    All other components within normal limits  TROPONIN I (HIGH SENSITIVITY) - Abnormal; Notable for the following components:   Troponin I (High Sensitivity) 220 (*)    All other components within normal limits  TROPONIN I (HIGH SENSITIVITY) - Abnormal; Notable for the following components:   Troponin I (High Sensitivity) 207 (*)    All other components within normal limits  CULTURE, BLOOD (ROUTINE X 2)  CULTURE, BLOOD (ROUTINE X 2)  CBC  LIPASE, BLOOD  PROTIME-INR  CREATININE, SERUM  COMPREHENSIVE METABOLIC PANEL WITH GFR  CBC  CK  CBG MONITORING, ED  I-STAT CG4 LACTIC ACID, ED    EKG: EKG Interpretation Date/Time:  Friday December 24 2023 14:55:37 EST Ventricular Rate:  85 PR Interval:  130 QRS Duration:  86 QT Interval:  382 QTC Calculation: 455 R Axis:   15  Text Interpretation: Sinus rhythm Abnormal R-wave progression, early  transition since last tracing no significant change Confirmed by Lenor Hollering [id number]) on 12/24/2023 3:09:15 PM  Radiology: [facility] Chest Port 1 View Result Date: 12/24/2023 EXAM: 1 VIEW(S) XRAY OF THE CHEST 12/24/2023 04:44:00 PM COMPARISON: None available. CLINICAL HISTORY: Questionable sepsis - evaluate for abnormality FINDINGS: LUNGS AND PLEURA: Notable eventration of the left posterior hemidiaphragm with stable volume loss/scarring at the left lung base involving the lower lobe and lingula. Increased/new mostly band-like opacity peripherally at the right lung base favoring atelectasis or pneumonia. No pleural effusion. No pneumothorax. HEART AND MEDIASTINUM: Compensating for left basilar opacity in the degree of distortion due to the scoliosis, heart size is felt to be within normal limits. No acute abnormality of the mediastinal silhouette. BONES AND SOFT TISSUES: Severe dextroconvex thoracic and levoconvex lumbar scoliosis with rotator component and resulting thoracic deformities. Lower cervical plate and screw fixator. Small right axillary clips noted. IMPRESSION: 1. Increased or new mostly band-like opacity peripherally at the right lung base, favoring atelectasis over pneumonia. 2. Stable volume loss and scarring at the left lung base involving the lower lobe and lingula. 3. Severe dextroconvex thoracic and levoconvex lumbar scoliosis with rotator component and resulting thoracic deformities. 4. Eventration of the left posterior hemidiaphragm. Electronically signed by: Ryan Salvage MD 12/24/2023 05:12 PM EST RP Workstation: [id number]   CT Cervical Spine Wo Contrast Result Date: 12/24/2023 EXAM: CT CERVICAL SPINE WITHOUT CONTRAST 12/24/2023 04:08:12 PM TECHNIQUE: CT of the cervical spine was performed without the administration of intravenous contrast. Multiplanar reformatted images are provided for review. Automated exposure control, iterative reconstruction, and/or weight based adjustment of the  mA/kV was utilized to reduce the radiation dose to as low as reasonably achievable. COMPARISON: CT cervical spine 11/29/2018. CLINICAL HISTORY: Multiple falls. FINDINGS: CERVICAL SPINE: BONES AND ALIGNMENT: Moderate to severe cervicothoracic levoscoliosis. No traumatic subluxation. No acute fracture or suspicious lesion. C3-C6 ACDF with solid arthrodesis as well as facet ankylosis. Right side facet ankylosis at C6-C7 as well. Left laminectomy at C6-C7. Right  sided foraminotomy at C4-C5. DEGENERATIVE CHANGES: Severe right facet arthrosis at C2-C3 and C7-T1 with associated neural foraminal stenosis. SOFT TISSUES: No prevertebral soft tissue swelling. IMPRESSION: 1. No acute cervical spine fracture or traumatic malalignment. 2. Extensive postoperative and degenerative changes as above. Electronically signed by: Dasie Hamburg MD 12/24/2023 04:30 PM EST RP Workstation: [id number]   CT Head Wo Contrast Result Date: 12/24/2023 EXAM: CT HEAD WITHOUT CONTRAST 12/24/2023 04:08:12 PM TECHNIQUE: CT of the head was performed without the administration of intravenous contrast. Automated exposure control, iterative reconstruction, and/or weight based adjustment of the mA/kV was utilized to reduce the radiation dose to as low as reasonably achievable. COMPARISON: Head CT 06/07/2022. CLINICAL HISTORY: Multiple falls with AMS. FINDINGS: BRAIN AND VENTRICLES: There is no evidence of an acute infarct, intracranial hemorrhage, mass, midline shift, hydrocephalus, or extra-axial fluid collection. There is mild cerebral atrophy. Cerebral white matter hypodensities are similar to the prior study and nonspecific but compatible with mild chronic small vessel ischemic disease. Calcified atherosclerosis at the skull base. ORBITS: No acute abnormality. SINUSES: No acute abnormality. SOFT TISSUES AND SKULL: No acute soft tissue abnormality. No skull fracture. IMPRESSION: 1. No acute intracranial abnormality. 2. Mild chronic small vessel ischemic  disease. Electronically signed by: Dasie Hamburg MD 12/24/2023 04:24 PM EST RP Workstation: [id number]     Procedures   Medications Ordered in the ED  enoxaparin  (LOVENOX ) injection 40 mg (40 mg Subcutaneous Given 12/24/23 2036)  dextrose  5% in lactated ringers  with KCl 20 mEq/L infusion ( Intravenous Infusion Verify 12/24/23 2100)  oxyCODONE  (Oxy IR/ROXICODONE ) immediate release tablet 5 mg (has no administration in time range)  ondansetron  (ZOFRAN ) tablet 4 mg (has no administration in time range)    Or  ondansetron  (ZOFRAN ) injection 4 mg (has no administration in time range)  anastrozole  (ARIMIDEX ) tablet 1 mg (has no administration in time range)  busPIRone  (BUSPAR ) tablet 10 mg (10 mg Oral Given 12/24/23 2225)  citalopram  (CELEXA ) tablet 40 mg (has no administration in time range)  clonazePAM  (KLONOPIN ) tablet 0.5 mg (0.5 mg Oral Given 12/24/23 2225)  lamoTRIgine  (LAMICTAL ) tablet 25 mg (has no administration in time range)  potassium chloride  (KLOR-CON ) CR tablet 10 mEq (10 mEq Oral Given 12/24/23 2225)  pravastatin  (PRAVACHOL ) tablet 20 mg (has no administration in time range)  QUEtiapine  (SEROQUEL ) tablet 50 mg (50 mg Oral Given 12/24/23 2225)  risperiDONE  (RISPERDAL ) tablet 0.5 mg (0.5 mg Oral Given 12/24/23 2225)  traZODone  (DESYREL ) tablet 25-50 mg (has no administration in time range)  albuterol  (PROVENTIL ) (2.5 MG/3ML) 0.083% nebulizer solution 2.5 mg (has no administration in time range)  lactated ringers  bolus 1,000 mL (0 mLs Intravenous Stopped 12/24/23 1657)  lactated ringers  bolus 1,000 mL ( Intravenous Stopped 12/24/23 1946)                                    Medical Decision Making Amount and/or Complexity of Data Reviewed Labs: ordered. Radiology: ordered.  Risk Decision regarding hospitalization.   Based on patient presentation, history, evaluation, high suspicion for rhabdomyolysis, as well as elevated troponin secondary to rhabdomyolysis.  Patient reportedly was  lying on the floor for a long period time, however it is overall undetermined, patient may have been on the floor.  This is likely where patient developed rhabdomyolysis, with significantly elevated CK, AKI, and significantly reduced UOP.  Fluid resuscitated in the ED, discussed with hospital team, agreeable to admission.  Will continue  to closely monitor patient in inpatient setting, with concerns for possibly developing acute renal failure.  Continue to trend troponins.     Final diagnoses:  Non-traumatic rhabdomyolysis  AKI (acute kidney injury)  Dehydration    ED Discharge Orders     None          Arlee Katz, MD 12/25/23 CLAUDELL    Lenor Hollering, MD 01/03/24 [phone]

## 2023-12-25 LAB — COMPREHENSIVE METABOLIC PANEL WITH GFR
ALT: 41 U/L (ref 0–44)
AST: 211 U/L — ABNORMAL HIGH (ref 15–41)
Albumin: 3.2 g/dL — ABNORMAL LOW (ref 3.5–5.0)
Alkaline Phosphatase: 34 U/L — ABNORMAL LOW (ref 38–126)
Anion gap: 11 (ref 5–15)
BUN: 13 mg/dL (ref 8–23)
CO2: 26 mmol/L (ref 22–32)
Calcium: 8.4 mg/dL — ABNORMAL LOW (ref 8.9–10.3)
Chloride: 104 mmol/L (ref 98–111)
Creatinine, Ser: 0.96 mg/dL (ref 0.44–1.00)
GFR, Estimated: >60 mL/min (ref >60)
Glucose, Bld: 124 mg/dL — ABNORMAL HIGH (ref 70–99)
Potassium: 3.8 mmol/L (ref 3.5–5.1)
Sodium: 141 mmol/L (ref 135–145)
Total Bilirubin: 0.9 mg/dL (ref 0.0–1.2)
Total Protein: 5.4 g/dL — ABNORMAL LOW (ref 6.5–8.1)

## 2023-12-25 LAB — CBC
HCT: 35.4 % — ABNORMAL LOW (ref 36.0–46.0)
Hemoglobin: 11.6 g/dL — ABNORMAL LOW (ref 12.0–15.0)
MCH: 31.5 pg (ref 26.0–34.0)
MCHC: 32.8 g/dL (ref 30.0–36.0)
MCV: 96.2 fL (ref 80.0–100.0)
Platelets: 163 K/uL (ref 150–400)
RBC: 3.68 MIL/uL — ABNORMAL LOW (ref 3.87–5.11)
RDW: 12.7 % (ref 11.5–15.5)
WBC: 8 K/uL (ref 4.0–10.5)
nRBC: 0 % (ref 0.0–0.2)

## 2023-12-25 LAB — CK: Total CK: 6718 U/L — ABNORMAL HIGH (ref 38–234)

## 2023-12-25 MED ORDER — GUAIFENESIN ER 600 MG PO TB12
600.0000 mg | ORAL_TABLET | Freq: Two times a day (BID) | ORAL | Status: DC
  Administered 2023-12-25 – 2023-12-29 (×9): 600 mg via ORAL
  Filled 2023-12-25 (×9): qty 1

## 2023-12-25 MED ORDER — HYDRALAZINE HCL 20 MG/ML IJ SOLN: 10.0000 mg | INTRAMUSCULAR | Status: DC | PRN

## 2023-12-25 MED ORDER — HALOPERIDOL LACTATE 5 MG/ML IJ SOLN: 1.0000 mg | Freq: Four times a day (QID) | INTRAMUSCULAR | Status: DC | PRN

## 2023-12-25 NOTE — Progress Notes (Signed)
 PROGRESS NOTE    Jennifer [name]  FMW:[id number] DOB: 1945/04/20 DOA: 12/24/2023 PCP: Glendia Shad, MD   Brief Narrative:    78 y.o. female with medical history significant of COPD, GERD, depression, asthma, panic attacks, essential hypertension, scoliosis who was brought in by family from home secondary to altered mental status. She has slight AKI, troponinemia and CPK elevation. Lives at home with her husband.  TOC consulted and will need HHS arrangement.  Assessment & Plan:  Principal Problem:   Rhabdomyolysis Active Problems:   Asthma   Essential hypertension, benign   GERD (gastroesophageal reflux disease)   Chronic restrictive lung disease   Memory loss   Hypokalemia   Depression   AKI (acute kidney injury)   Dehydration   Acute rhabdomyolysis: She was sleeping on the floor for the past few days for unclear reason. Continue with IVF (rate decreased to 75 cc/hr) Follow CPK levels- trending downwards (6718 down from 7483).     Elevated troponins: down ward trend, not consistent with ACS Most likely secondary to rhabdomyolysis.  -Ordered TTE -No chest pain   Physical Deconditioning,POA: Ordered PT and OT evaluation   Asthma: No acute exacerbation.  Continue to monitor   Restrictive lung disease: Patient on room air and doing well.  Continue to monitor   AKI: Most likely prerenal, resolved    Dehydration: Likely poor oral intake.  Hydrate monitor   Essential hypertension: hydralazine  10 mg IV Q4H prn for SBP > 160 mmHg  Disposition: Lives at Home with her husband. Needs HHS on discharge.   DVT prophylaxis: enoxaparin  (LOVENOX ) injection 40 mg Start: 12/24/23 2100     Code Status: Full Code Family Communication:  husband and son at the bedside Status is: Inpatient Remains inpatient appropriate because: dehydration    Subjective:  She appears calm but slightly confused. Able to tell me that she is in a hospital in [place] but couldn' t tell me the  name of the hospital. She was able to tell me her birthday.  Examination:  General exam: Appears calm and comfortable  Respiratory system: Clear to auscultation. Respiratory effort normal. Cardiovascular system: S1 & S2 heard, RRR. No JVD, murmurs, rubs, gallops or clicks. No pedal edema. Gastrointestinal system: Abdomen is nondistended, soft and nontender. No organomegaly or masses felt. Normal bowel sounds heard. Central nervous system: Alert and oriented x 1. No focal neurological deficits. Extremities: Symmetric 5 x 5 power. Skin: No rashes, lesions or ulcers      Diet Orders (From admission, onward)     Start     Ordered   12/24/23 2001  Diet Heart Room service appropriate? Yes; Fluid consistency: Thin  Diet effective now       Question Answer Comment  Room service appropriate? Yes   Fluid consistency: Thin      12/24/23 2000            Objective: Vitals:   12/24/23 1830 12/24/23 1846 12/24/23 1958 12/24/23 2020  BP: 116/70  (!) 134/59 133/65  Pulse: 91  83 84  Resp: (!) 24  (!) 22 16  Temp:  98.1 F (36.7 C) 98.7 F (37.1 C) 98.2 F (36.8 C)  TempSrc:  Oral Oral Oral  SpO2: 97%  98% 96%    Intake/Output Summary (Last 24 hours) at 12/25/2023 1032 Last data filed at 12/25/2023 9351 Gross per 24 hour  Intake 2509.48 ml  Output 0 ml  Net 2509.48 ml   There were no vitals filed for this visit.  Scheduled Meds:  anastrozole   1 mg Oral Daily   busPIRone   10 mg Oral TID   citalopram   40 mg Oral Daily   clonazePAM   0.5 mg Oral BID   enoxaparin  (LOVENOX ) injection  40 mg Subcutaneous Q24H   lamoTRIgine   25 mg Oral QPC lunch   potassium chloride   10 mEq Oral Daily   pravastatin   20 mg Oral Daily   QUEtiapine   50 mg Oral QHS   risperiDONE   0.5 mg Oral Daily   Continuous Infusions:  dextrose  5% lactated ringers  with KCl 20 mEq/L 150 mL/hr at 12/25/23 9351    Nutritional status     There is no height or weight on file to calculate BMI.  Data Reviewed:    CBC: Recent Labs  Lab 12/24/23 1540 12/24/23 1614 12/24/23 2148 12/25/23 0614  WBC 9.9  --  10.0 8.0  HGB 13.1 13.3 11.9* 11.6*  HCT 40.9 39.0 36.1 35.4*  MCV 97.4  --  97.0 96.2  PLT 195  --  163 163   Basic Metabolic Panel: Recent Labs  Lab 12/24/23 1540 12/24/23 1614 12/24/23 2148 12/25/23 0525  NA 141 139  --  141  K 3.3* 3.1*  --  3.8  CL 99  --   --  104  CO2 29  --   --  26  GLUCOSE 108*  --   --  124*  BUN 19  --   --  13  CREATININE 1.05*  --  0.88 0.96  CALCIUM 8.8*  --   --  8.4*   GFR: CrCl cannot be calculated (Unknown ideal weight.). Liver Function Tests: Recent Labs  Lab 12/24/23 1540 12/25/23 0525  AST 163* 211*  ALT 35 41  ALKPHOS 40 34*  BILITOT 1.0 0.9  PROT 6.5 5.4*  ALBUMIN 3.8 3.2*   Recent Labs  Lab 12/24/23 1540  LIPASE 23   No results for input(s): AMMONIA in the last 168 hours. Coagulation Profile: Recent Labs  Lab 12/24/23 1540  INR 1.0   Cardiac Enzymes: Recent Labs  Lab 12/24/23 1540 12/25/23 0525  CKTOTAL 7,483* 6,718*   BNP (last 3 results) No results for input(s): PROBNP in the last 8760 hours. HbA1C: No results for input(s): HGBA1C in the last 72 hours. CBG: Recent Labs  Lab 12/24/23 1717  GLUCAP 96   Lipid Profile: No results for input(s): CHOL, HDL, LDLCALC, TRIG, CHOLHDL, LDLDIRECT in the last 72 hours. Thyroid  Function Tests: No results for input(s): TSH, T4TOTAL, FREET4, T3FREE, THYROIDAB in the last 72 hours. Anemia Panel: No results for input(s): VITAMINB12, FOLATE, FERRITIN, TIBC, IRON, RETICCTPCT in the last 72 hours. Sepsis Labs: Recent Labs  Lab 12/24/23 1614  LATICACIDVEN 1.5    Recent Results (from the past 240 hours)  Blood Culture (routine x 2)     Status: None (Preliminary result)   Collection Time: 12/24/23  3:40 PM   Specimen: BLOOD  Result Value Ref Range Status   Specimen Description BLOOD RIGHT ANTECUBITAL  Final   Special  Requests   Final    BOTTLES DRAWN AEROBIC AND ANAEROBIC Blood Culture adequate volume   Culture   Final    NO GROWTH < 24 HOURS Performed at [facility], [address], [place], [place] [address]    Report Status PENDING  Incomplete  Blood Culture (routine x 2)     Status: None (Preliminary result)   Collection Time: 12/24/23  6:17 PM   Specimen: BLOOD  Result Value  Ref Range Status   Specimen Description BLOOD SITE NOT SPECIFIED  Final   Special Requests   Final    BOTTLES DRAWN AEROBIC AND ANAEROBIC Blood Culture adequate volume   Culture   Final    NO GROWTH < 24 HOURS Performed at [facility], [address] [place], [place] [address]    Report Status PENDING  Incomplete         Radiology Studies: DG Chest Port 1 View Result Date: 12/24/2023 EXAM: 1 VIEW(S) XRAY OF THE CHEST 12/24/2023 04:44:00 PM COMPARISON: None available. CLINICAL HISTORY: Questionable sepsis - evaluate for abnormality FINDINGS: LUNGS AND PLEURA: Notable eventration of the left posterior hemidiaphragm with stable volume loss/scarring at the left lung base involving the lower lobe and lingula. Increased/new mostly band-like opacity peripherally at the right lung base favoring atelectasis or pneumonia. No pleural effusion. No pneumothorax. HEART AND MEDIASTINUM: Compensating for left basilar opacity in the degree of distortion due to the scoliosis, heart size is felt to be within normal limits. No acute abnormality of the mediastinal silhouette. BONES AND SOFT TISSUES: Severe dextroconvex thoracic and levoconvex lumbar scoliosis with rotator component and resulting thoracic deformities. Lower cervical plate and screw fixator. Small right axillary clips noted. IMPRESSION: 1. Increased or new mostly band-like opacity peripherally at the right lung base, favoring atelectasis over pneumonia. 2. Stable volume loss and scarring at the left lung base involving the lower lobe and lingula. 3. Severe  dextroconvex thoracic and levoconvex lumbar scoliosis with rotator component and resulting thoracic deformities. 4. Eventration of the left posterior hemidiaphragm. Electronically signed by: Ryan Salvage MD 12/24/2023 05:12 PM EST RP Workstation: [id number]   CT Cervical Spine Wo Contrast Result Date: 12/24/2023 EXAM: CT CERVICAL SPINE WITHOUT CONTRAST 12/24/2023 04:08:12 PM TECHNIQUE: CT of the cervical spine was performed without the administration of intravenous contrast. Multiplanar reformatted images are provided for review. Automated exposure control, iterative reconstruction, and/or weight based adjustment of the mA/kV was utilized to reduce the radiation dose to as low as reasonably achievable. COMPARISON: CT cervical spine 11/29/2018. CLINICAL HISTORY: Multiple falls. FINDINGS: CERVICAL SPINE: BONES AND ALIGNMENT: Moderate to severe cervicothoracic levoscoliosis. No traumatic subluxation. No acute fracture or suspicious lesion. C3-C6 ACDF with solid arthrodesis as well as facet ankylosis. Right side facet ankylosis at C6-C7 as well. Left laminectomy at C6-C7. Right sided foraminotomy at C4-C5. DEGENERATIVE CHANGES: Severe right facet arthrosis at C2-C3 and C7-T1 with associated neural foraminal stenosis. SOFT TISSUES: No prevertebral soft tissue swelling. IMPRESSION: 1. No acute cervical spine fracture or traumatic malalignment. 2. Extensive postoperative and degenerative changes as above. Electronically signed by: Dasie Hamburg MD 12/24/2023 04:30 PM EST RP Workstation: [id number]   CT Head Wo Contrast Result Date: 12/24/2023 EXAM: CT HEAD WITHOUT CONTRAST 12/24/2023 04:08:12 PM TECHNIQUE: CT of the head was performed without the administration of intravenous contrast. Automated exposure control, iterative reconstruction, and/or weight based adjustment of the mA/kV was utilized to reduce the radiation dose to as low as reasonably achievable. COMPARISON: Head CT 06/07/2022. CLINICAL HISTORY:  Multiple falls with AMS. FINDINGS: BRAIN AND VENTRICLES: There is no evidence of an acute infarct, intracranial hemorrhage, mass, midline shift, hydrocephalus, or extra-axial fluid collection. There is mild cerebral atrophy. Cerebral white matter hypodensities are similar to the prior study and nonspecific but compatible with mild chronic small vessel ischemic disease. Calcified atherosclerosis at the skull base. ORBITS: No acute abnormality. SINUSES: No acute abnormality. SOFT TISSUES AND SKULL: No acute soft tissue abnormality. No skull fracture. IMPRESSION: 1.  No acute intracranial abnormality. 2. Mild chronic small vessel ischemic disease. Electronically signed by: Dasie Hamburg MD 12/24/2023 04:24 PM EST RP Workstation: [id number]           LOS: 1 day   Time spent= 35 mins    Deliliah Room, MD [facility]  If 7PM-7AM, please contact night-coverage  12/25/2023, 10:32 AM

## 2023-12-26 ENCOUNTER — Inpatient Hospital Stay (HOSPITAL_COMMUNITY)

## 2023-12-26 LAB — URINALYSIS, W/ REFLEX TO CULTURE (INFECTION SUSPECTED)
Bacteria, UA: NONE SEEN
Bilirubin Urine: NEGATIVE
Glucose, UA: NEGATIVE mg/dL
Hgb urine dipstick: NEGATIVE
Ketones, ur: NEGATIVE mg/dL
Leukocytes,Ua: NEGATIVE
Nitrite: NEGATIVE
Protein, ur: NEGATIVE mg/dL
Specific Gravity, Urine: 1.013 (ref 1.005–1.030)
pH: 6 (ref 5.0–8.0)

## 2023-12-26 LAB — ECHOCARDIOGRAM COMPLETE
AR max vel: 1.93 cm2
AV Peak grad: 3.8 mmHg
Ao pk vel: 0.98 m/s
Area-P 1/2: 3.91 cm2
S' Lateral: 3.2 cm

## 2023-12-26 LAB — CK: Total CK: 2420 U/L — ABNORMAL HIGH (ref 38–234)

## 2023-12-26 MED ORDER — CITALOPRAM HYDROBROMIDE 20 MG PO TABS
20.0000 mg | ORAL_TABLET | Freq: Every day | ORAL | Status: DC
  Administered 2023-12-27 – 2023-12-29 (×3): 20 mg via ORAL
  Filled 2023-12-26 (×3): qty 1

## 2023-12-26 MED ORDER — POLYETHYLENE GLYCOL 3350 17 G PO PACK
17.0000 g | PACK | Freq: Every day | ORAL | Status: DC
  Administered 2023-12-26 – 2023-12-29 (×4): 17 g via ORAL
  Filled 2023-12-26 (×4): qty 1

## 2023-12-26 MED ORDER — CLONAZEPAM 0.5 MG PO TABS
0.5000 mg | ORAL_TABLET | Freq: Two times a day (BID) | ORAL | Status: DC | PRN
  Administered 2023-12-26 – 2023-12-29 (×5): 0.5 mg via ORAL
  Filled 2023-12-26 (×5): qty 1

## 2023-12-26 NOTE — Evaluation (Signed)
 Occupational Therapy Evaluation Patient Details Name: Jennifer [name] MRN: [id number] DOB: 01-28-1945 Today's Date: 12/26/2023   History of Present Illness   78 y.o. female presents to [facility] 12/5 for AMS and ?multiple falls. Found to have some AKI, hypokalemia, and CPK of more than 7000 leading to suspected fall with rhabdomyolysis. PMH: COPD, GERD, depression, asthma, panic attacks, essential HTN, scoliosis, early dementia and memory changes.     Clinical Impressions PTA Pt required assistance from spouse for ADL engagement and functional mobility. Pt currently requires up to total A +2  overall for functional transfers and ADL tasks. Pt is primarily limited by impaired cognition, generalized weakness, decreased activity tolerance, and fear of falling. Per family report, Pt spouse is unreliable as well as he also has memory impairments. Family states they believe Pt was on the ground for 2 days prior to this admission. OT to continue to follow Pt acutely to facilitate progress towards goals. Patient will benefit from continued inpatient follow up therapy, <3 hours/day.      If plan is discharge home, recommend the following:   Two people to help with walking and/or transfers;Two people to help with bathing/dressing/bathroom;Assistance with cooking/housework;Direct supervision/assist for medications management;Direct supervision/assist for financial management;Assist for transportation;Help with stairs or ramp for entrance;Supervision due to cognitive status     Functional Status Assessment   Patient has had a recent decline in their functional status and/or demonstrates limited ability to make significant improvements in function in a reasonable and predictable amount of time     Equipment Recommendations   Other (comment) (defer to next venue)     Recommendations for Other Services         Precautions/Restrictions   Precautions Precautions: Fall Recall of  Precautions/Restrictions: Impaired     Mobility Bed Mobility Overal bed mobility: Needs Assistance Bed Mobility: Supine to Sit, Sit to Supine     Supine to sit: Max assist, +2 for physical assistance, +2 for safety/equipment, HOB elevated Sit to supine: Total assist, +2 for physical assistance, +2 for safety/equipment   General bed mobility comments: Max A to come to EOB on R side. Pt able to minimally move BLE towards EOB but required Max A to complete movement and to elevate trunk. Pt required posterior support to maintain sitting balance. Pt with increased fear of falling while seated EOB. Total A to return to supine and reposition in bed.    Transfers Overall transfer level: Needs assistance Equipment used: 2 person hand held assist Transfers: Sit to/from Stand Sit to Stand: Total assist, +2 physical assistance, +2 safety/equipment           General transfer comment: Total A sit to stand with use of bed pad. BLE sliding forwards in standing, unable to fully extend knees. Pt required cues for anterior lean in standing.      Balance Overall balance assessment: Needs assistance, History of Falls Sitting-balance support: Bilateral upper extremity supported, Feet supported Sitting balance-Leahy Scale: Poor   Postural control: Posterior lean Standing balance support: Bilateral upper extremity supported, During functional activity Standing balance-Leahy Scale: Zero Standing balance comment: Pt unable to maintain standing balance without total A.                           ADL either performed or assessed with clinical judgement   ADL Overall ADL's : Needs assistance/impaired Eating/Feeding: Minimal assistance;Sitting Eating/Feeding Details (indicate cue type and reason): Pt required Min A to eat muffin  seated EOB Grooming: Moderate assistance;Sitting;Cueing for sequencing   Upper Body Bathing: Maximal assistance   Lower Body Bathing: Total assistance   Upper  Body Dressing : Moderate assistance   Lower Body Dressing: Total assistance   Toilet Transfer: Total assistance   Toileting- Clothing Manipulation and Hygiene: Total assistance               Vision   Vision Assessment?: No apparent visual deficits     Perception         Praxis         Pertinent Vitals/Pain Pain Assessment Pain Assessment: No/denies pain     Extremity/Trunk Assessment Upper Extremity Assessment Upper Extremity Assessment: Defer to OT evaluation;Generalized weakness;Right hand dominant (limited shoulder ROM bilaterally and poor grip)   Lower Extremity Assessment Lower Extremity Assessment: Generalized weakness (limited ROM bilateral ankles, 2+/5 at bilateral knees and hips)   Cervical / Trunk Assessment Cervical / Trunk Assessment: Kyphotic;Other exceptions Cervical / Trunk Exceptions: increased body habitus   Communication Communication Communication: No apparent difficulties   Cognition Arousal: Alert Behavior During Therapy: Anxious Cognition: History of cognitive impairments             OT - Cognition Comments: Baseline dementia                 Following commands: Impaired Following commands impaired: Follows one step commands inconsistently, Follows one step commands with increased time     Cueing  General Comments   Cueing Techniques: Verbal cues;Gestural cues;Visual cues  Per discussion with son and DIL after soon, assumed Pt was laying on ground for 2 days PTA. Family states that the spouse has decreased memory as well and will often forget to provide medications at correct times or even how long the Pt has been on the ground for.   Exercises     Shoulder Instructions      Home Living Family/patient expects to be discharged to:: Private residence Living Arrangements: Spouse/significant other Available Help at Discharge: Family;Available PRN/intermittently (Daughter and neice live in [place]. Son lives close by  as well) Type of Home: Apartment Home Access: Elevator     Home Layout: One level     Bathroom Shower/Tub: Producer, Television/film/video: Standard     Home Equipment: Shower seat;Grab bars - toilet;Grab bars - tub/shower;Hand held Stage Manager (4 wheels)   Additional Comments: [place] at [employer]      Prior Functioning/Environment Prior Level of Function : Needs assist  Cognitive Assist : Mobility (cognitive);ADLs (cognitive) Mobility (Cognitive): Step by step cues ADLs (Cognitive): Step by step cues Physical Assist : Mobility (physical);ADLs (physical) Mobility (physical): Bed mobility;Transfers;Gait ADLs (physical): Feeding;Grooming;Dressing;Bathing;Toileting;IADLs Mobility Comments: Per husband report, Pt has not had any falls. Pt son and DIL state that there have been falls but they do not think that either the Pt or Pt spouse remember them. per spouse, pt largely in bed for last week, layed down on floor due to fatigue. ADLs Comments: per spouse, he assists with ADLs and IADLs    OT Problem List: Decreased strength;Decreased activity tolerance;Impaired balance (sitting and/or standing);Decreased cognition;Decreased safety awareness;Decreased knowledge of use of DME or AE   OT Treatment/Interventions: Self-care/ADL training;Therapeutic exercise;DME and/or AE instruction;Therapeutic activities;Patient/family education;Balance training      OT Goals(Current goals can be found in the care plan section)   Acute Rehab OT Goals Patient Stated Goal: None stated this session OT Goal Formulation: With patient/family Time For Goal Achievement: 01/09/24 Potential to Achieve Goals:  Fair ADL Goals Pt Will Perform Eating: with supervision;sitting Pt Will Perform Grooming: with supervision;sitting Pt Will Perform Upper Body Dressing: with min assist;sitting Pt Will Perform Lower Body Dressing: with mod assist;sitting/lateral leans Pt Will Transfer to  Toilet: with mod assist;with +2 assist;bedside commode Additional ADL Goal #1: Pt will engage in ADL task while seated EOB for 5 minutes with no more than Min A.   OT Frequency:  Min 2X/week    Co-evaluation PT/OT/SLP Co-Evaluation/Treatment: Yes Reason for Co-Treatment: Necessary to address cognition/behavior during functional activity;For patient/therapist safety;To address functional/ADL transfers PT goals addressed during session: Mobility/safety with mobility;Balance;Strengthening/ROM OT goals addressed during session: ADL's and self-care;Strengthening/ROM      AM-PAC OT 6 Clicks Daily Activity     Outcome Measure Help from another person eating meals?: A Little Help from another person taking care of personal grooming?: A Lot Help from another person toileting, which includes using toliet, bedpan, or urinal?: Total Help from another person bathing (including washing, rinsing, drying)?: Total Help from another person to put on and taking off regular upper body clothing?: A Lot Help from another person to put on and taking off regular lower body clothing?: Total 6 Click Score: 10   End of Session Equipment Utilized During Treatment: Gait belt  Activity Tolerance: Patient tolerated treatment well Patient left: in bed;with call bell/phone within reach;with bed alarm set;with family/visitor present  OT Visit Diagnosis: Muscle weakness (generalized) (M62.81);History of falling (Z91.81);Unsteadiness on feet (R26.81);Other symptoms and signs involving cognitive function                Time: 9084-9059 OT Time Calculation (min): 25 min Charges:  OT General Charges $OT Visit: 1 Visit OT Evaluation $OT Eval Moderate Complexity: 1 Mod  Maurilio CROME, OTR/L.  [facility]  Office: [phone]   Maurilio PARAS Carlotta Telfair 12/26/2023, 11:43 AM

## 2023-12-26 NOTE — Plan of Care (Signed)
  Problem: Fluid Volume: Goal: Compliance with measures to maintain balanced fluid volume will improve Outcome: Progressing   Problem: Health Behavior/Discharge Planning: Goal: Ability to manage health-related needs will improve Outcome: Progressing   Problem: Nutritional: Goal: Ability to make healthy dietary choices will improve Outcome: Progressing

## 2023-12-26 NOTE — Hospital Course (Signed)
 78 y.o. F with MCI, lives in independent living with husband, COPD, HTN, and scoliosis who presented with generalized weakness, increased confusion, went to lay down on the floor by her bed and wouldn't get up (for unclear period of time) and in the ER, found to have rhabdomyolysis.

## 2023-12-26 NOTE — Progress Notes (Signed)
  Progress Note   Patient: Jennifer [name] FMW:[id number] DOB: [birth year] DOA: 12/24/2023     2 DOS: the patient was seen and examined on 12/26/2023 at 10:01AM      Brief hospital course: 78 y.o. F with MCI, lives in independent living with husband, COPD, HTN, and scoliosis who presented with generalized weakness, increased confusion, went to lay down on the floor by her bed and wouldn't get up (for unclear period of time) and in the ER, found to have rhabdomyolysis.     Assessment and Plan: Acute metabolic encephalopathy No evidence of serotonin syndrome, neuroleptic malignant syndrome.  Family reported urinary irritative symptoms on admission, and also now reports that she has a cough. - Repeat two-view chest x-ray - Obtain urine culture  Rhabdomyolysis This appears to be resolving - Trend CK and creatinine  AKI Mild, resolved with fluids - Hold triamterene  HCTZ  Hyperlipidemia - Hold pravastatin   Myocardial injury Probably due to dehydration, no chest pain or EKG changes to suggest ischemia - Follow-up echocardiogram  Mood disorder - Continue BuSpar , Celexa , clonazepam , Lamictal , quetiapine , Risperdal   COPD Breathing at baseline, no wheezing or rales  Hypertension Blood pressure somewhat elevated - Hold triamterene -HCTZ 1 more day  History of breast cancer No longer on Arimidex   Hypokalemia Treated and resolved          Subjective: Patient has had no clinical change.  She has a persistent cough.  She has had no abdominal pain, nausea, diarrhea.  No fever or leukocytosis.     Physical Exam: BP (!) 151/91 (BP Location: Left Arm)   Pulse (!) 102   Temp 97.8 F (36.6 C) (Oral)   Resp 18   SpO2 (!) 86%   Adult female, lying in bed, appears weak and tired Marked psychomotor slowing Tachycardic, regular, no murmurs, no peripheral edema Respiratory effort seems normal, lung sounds diminished bilaterally, coarse breath sounds at the right base Abdomen  soft, no tenderness palpation or guarding, no ascites or distention She is inattentive to conversation, affect is flat, judgment seems impaired, she has impaired short-term memory, psychomotor slowing is severe, face symmetric, severe generalized weakness in all 4 extremities, no focal loss of sensation, speech nondysarthric    Data Reviewed: Basic metabolic panel unremarkable CBC shows no leukocytosis, does show mild anemia     Family Communication: Husband, son, daughter-in-law at the bedside    Disposition: Status is: Inpatient         Author: Lonni SHAUNNA Dalton, MD 12/26/2023 2:14 PM  For on call review [email or web address].

## 2023-12-26 NOTE — Evaluation (Signed)
 Physical Therapy Evaluation Patient Details Name: Jennifer [name] MRN: [id number] DOB: Jun 27, 1945 Today's Date: 12/26/2023  History of Present Illness  78 y.o. female presents to [facility] 12/5 for AMS and ?multiple falls. Found to have some AKI, hypokalemia, and CPK of more than 7000 leading to suspected fall with rhabdomyolysis. PMH: COPD, GERD, depression, asthma, panic attacks, essential HTN, scoliosis, early dementia and memory changes.  Clinical Impression  Pt in bed upon arrival of PT, agreeable to evaluation at this time. Prior to admission the pt was living with her spouse in an apt with elevator access, and is typically ambulatory without DME but spouse reports the pt has been largely bedbound for ~1 week PTA. The pt presents with significant weakness in extremities, far of falling, spatial awareness, seated balance, and memory. The pt requires significant assist of 2 to complete all bed mobility and attempt sit-stand transfer at this time. The spouse was present for session and confirms this is more assistance than he is safely able to provide. Will continue to follow acutely, but recommend post-acute therapies <3hours/day to facilitate return to greatest level of independence with transfers and mobility prior to return home with spouse support.    If plan is discharge home, recommend the following: Two people to help with walking and/or transfers;Two people to help with bathing/dressing/bathroom;Assistance with cooking/housework;Direct supervision/assist for medications management;Direct supervision/assist for financial management;Assist for transportation;Help with stairs or ramp for entrance;Supervision due to cognitive status   Can travel by private vehicle   No    Equipment Recommendations Wheelchair (measurements PT);Wheelchair cushion (measurements PT);Hospital bed;Hoyer lift  Recommendations for Other Services       Functional Status Assessment Patient has had a recent decline in their  functional status and demonstrates the ability to make significant improvements in function in a reasonable and predictable amount of time.     Precautions / Restrictions Precautions Precautions: Fall Recall of Precautions/Restrictions: Impaired Precaution/Restrictions Comments: frequent falls suspected by family Restrictions Weight Bearing Restrictions Per Provider Order: No      Mobility  Bed Mobility Overal bed mobility: Needs Assistance Bed Mobility: Rolling, Supine to Sit, Sit to Supine Rolling: Max assist, Used rails   Supine to sit: Max assist, +2 for physical assistance, +2 for safety/equipment, HOB elevated Sit to supine: Total assist, +2 for physical assistance, +2 for safety/equipment   General bed mobility comments: Max A to come to EOB on R side. Pt able to minimally move BLE towards EOB but required Max A to complete movement and to elevate trunk. Pt required posterior support to maintain sitting balance. Pt with increased fear of falling while seated EOB. Total A to return to supine and reposition in bed.    Transfers Overall transfer level: Needs assistance Equipment used: 2 person hand held assist Transfers: Sit to/from Stand Sit to Stand: Total assist, +2 physical assistance, +2 safety/equipment           General transfer comment: Total A sit to stand with use of bed pad. BLE sliding forwards in standing, unable to fully extend knees. Pt required cues for anterior lean in standing.    Ambulation/Gait               General Gait Details: pt unable     Balance Overall balance assessment: Needs assistance, History of Falls Sitting-balance support: Bilateral upper extremity supported, Feet supported Sitting balance-Leahy Scale: Poor Sitting balance - Comments: dependent on BUE support and posterior support, tearful when sitting EOB due to fear of falling  Postural control: Posterior lean Standing balance support: Bilateral upper extremity supported,  During functional activity Standing balance-Leahy Scale: Zero Standing balance comment: Pt unable to maintain standing balance without total A.                             Pertinent Vitals/Pain Pain Assessment Pain Assessment: No/denies pain    Home Living Family/patient expects to be discharged to:: Private residence Living Arrangements: Spouse/significant other Available Help at Discharge: Family;Available PRN/intermittently (Daughter and neice live in [place]. Son lives close by as well) Type of Home: Apartment Home Access: Elevator       Home Layout: One level Home Equipment: Shower seat;Grab bars - toilet;Grab bars - tub/shower;Hand held Stage Manager (4 wheels) Additional Comments: [place] [facility] [employer]    Prior Function Prior Level of Function : Needs assist  Cognitive Assist : Mobility (cognitive);ADLs (cognitive) Mobility (Cognitive): Step by step cues ADLs (Cognitive): Step by step cues Physical Assist : Mobility (physical);ADLs (physical) Mobility (physical): Bed mobility;Transfers;Gait ADLs (physical): Feeding;Grooming;Dressing;Bathing;Toileting;IADLs Mobility Comments: Per husband report, Pt has not had any falls. Pt son and DIL state that there have been falls but they do not think that either the Pt or Pt spouse remember them. per spouse, pt largely in bed for last week, layed down on floor due to fatigue. ADLs Comments: per spouse, he assists with ADLs and IADLs     Extremity/Trunk Assessment   Upper Extremity Assessment Upper Extremity Assessment: Defer to OT evaluation;Generalized weakness;Right hand dominant (limited shoulder ROM bilaterally and poor grip)    Lower Extremity Assessment Lower Extremity Assessment: Generalized weakness (limited ROM bilateral ankles, 2+/5 at bilateral knees and hips)    Cervical / Trunk Assessment Cervical / Trunk Assessment: Kyphotic;Other exceptions Cervical / Trunk Exceptions: increased  body habitus  Communication   Communication Communication: No apparent difficulties    Cognition Arousal: Alert Behavior During Therapy: Flat affect, Anxious   PT - Cognitive impairments: History of cognitive impairments, Memory, Awareness, Initiation, Sequencing, Problem solving, Safety/Judgement, Orientation   Orientation impairments: Place, Time, Situation                   PT - Cognition Comments: pt needing cues to complete all movements, oriented to self only (not location or date, and unable to name her birth year or age without cues). pt with significant fear of falling and movement, but redirectable and agreeable with encouragement Following commands: Impaired Following commands impaired: Follows one step commands inconsistently, Follows one step commands with increased time     Cueing Cueing Techniques: Verbal cues     General Comments General comments (skin integrity, edema, etc.): Per discussion with son and DIL after soon, assumed Pt was laying on ground for 2 days PTA. Family states that the spouse has decreased memory as well and will often forget to provide medications at correct times or even how long the Pt has been on the ground for.    Exercises     Assessment/Plan    PT Assessment Patient needs continued PT services  PT Problem List Decreased strength;Decreased range of motion;Decreased activity tolerance;Decreased balance;Decreased mobility;Decreased coordination;Decreased cognition;Obesity;Decreased skin integrity       PT Treatment Interventions DME instruction;Gait training;Stair training;Functional mobility training;Therapeutic activities;Therapeutic exercise;Neuromuscular re-education;Balance training;Patient/family education    PT Goals (Current goals can be found in the Care Plan section)  Acute Rehab PT Goals Patient Stated Goal: to improve mobility before returning home PT Goal Formulation:  With patient Time For Goal Achievement:  01/09/24 Potential to Achieve Goals: Good    Frequency Min 2X/week     Co-evaluation   Reason for Co-Treatment: Necessary to address cognition/behavior during functional activity;For patient/therapist safety;To address functional/ADL transfers PT goals addressed during session: Mobility/safety with mobility;Balance;Strengthening/ROM OT goals addressed during session: ADL's and self-care;Strengthening/ROM       AM-PAC PT 6 Clicks Mobility  Outcome Measure Help needed turning from your back to your side while in a flat bed without using bedrails?: A Lot Help needed moving from lying on your back to sitting on the side of a flat bed without using bedrails?: Total Help needed moving to and from a bed to a chair (including a wheelchair)?: Total Help needed standing up from a chair using your arms (e.g., wheelchair or bedside chair)?: Total Help needed to walk in hospital room?: Total Help needed climbing 3-5 steps with a railing? : Total 6 Click Score: 7    End of Session Equipment Utilized During Treatment: Gait belt Activity Tolerance: Patient tolerated treatment well;Patient limited by fatigue Patient left: in chair;with call bell/phone within reach;with family/visitor present Nurse Communication: Mobility status;Need for lift equipment PT Visit Diagnosis: Unsteadiness on feet (R26.81);Other abnormalities of gait and mobility (R26.89);Repeated falls (R29.6);Muscle weakness (generalized) (M62.81)    Time: 9084-9059 PT Time Calculation (min) (ACUTE ONLY): 25 min   Charges:   PT Evaluation $PT Eval Moderate Complexity: 1 Mod   PT General Charges $$ ACUTE PT VISIT: 1 Visit         Izetta Call, PT, DPT   Acute Rehabilitation Department Office [phone] Secure Chat Communication Preferred  Izetta JULIANNA Call 12/26/2023, 11:46 AM

## 2023-12-27 LAB — CBC
HCT: 38.2 % (ref 36.0–46.0)
Hemoglobin: 12.2 g/dL (ref 12.0–15.0)
MCH: 31.9 pg (ref 26.0–34.0)
MCHC: 31.9 g/dL (ref 30.0–36.0)
MCV: 99.7 fL (ref 80.0–100.0)
Platelets: 148 K/uL — ABNORMAL LOW (ref 150–400)
RBC: 3.83 MIL/uL — ABNORMAL LOW (ref 3.87–5.11)
RDW: 13.1 % (ref 11.5–15.5)
WBC: 10.9 K/uL — ABNORMAL HIGH (ref 4.0–10.5)
nRBC: 0 % (ref 0.0–0.2)

## 2023-12-27 LAB — BASIC METABOLIC PANEL WITH GFR
Anion gap: 13 (ref 5–15)
BUN: 13 mg/dL (ref 8–23)
CO2: 24 mmol/L (ref 22–32)
Calcium: 8.1 mg/dL — ABNORMAL LOW (ref 8.9–10.3)
Chloride: 106 mmol/L (ref 98–111)
Creatinine, Ser: 0.69 mg/dL (ref 0.44–1.00)
GFR, Estimated: >60 mL/min (ref >60)
Glucose, Bld: 109 mg/dL — ABNORMAL HIGH (ref 70–99)
Potassium: 4.5 mmol/L (ref 3.5–5.1)
Sodium: 143 mmol/L (ref 135–145)

## 2023-12-27 LAB — PROCALCITONIN: Procalcitonin: <0.1 ng/mL

## 2023-12-27 MED ORDER — ENSURE PLUS HIGH PROTEIN PO LIQD
237.0000 mL | Freq: Two times a day (BID) | ORAL | Status: DC
  Administered 2023-12-27 – 2023-12-29 (×5): 237 mL via ORAL

## 2023-12-27 MED ORDER — ORAL CARE MOUTH RINSE: 15.0000 mL | OROMUCOSAL | Status: DC | PRN

## 2023-12-27 MED ORDER — ACETAMINOPHEN 500 MG PO TABS
1000.0000 mg | ORAL_TABLET | Freq: Three times a day (TID) | ORAL | Status: DC | PRN
  Administered 2023-12-27 – 2023-12-28 (×2): 1000 mg via ORAL
  Filled 2023-12-27 (×2): qty 2

## 2023-12-27 NOTE — TOC PASRR Note (Signed)
 [facility] IP TOC PASRR NOTE  RE: Jennifer [name] Date of Birth: July 22, 1945 Date: 12/27/2023   To Whom It May Concern:  Please be advised that the above-named patient will require a short-term nursing home stay - anticipated 30 days or less for rehabilitation and strengthening.  The plan is for return home.                 MD signature

## 2023-12-27 NOTE — Progress Notes (Signed)
  Progress Note   Patient: Jennifer [name] FMW:[id number] DOB: Jun 27, 1945 DOA: 12/24/2023     3 DOS: the patient was seen and examined on 12/27/2023        Brief hospital course: 78 y.o. F with MCI, lives in independent living with husband, COPD, HTN, and scoliosis who presented with generalized weakness, increased confusion, went to lay down on the floor by her bed and wouldn't get up (for unclear period of time) and in the ER, found to have rhabdomyolysis.     Assessment and Plan: Acute metabolic encephalopathy Admitted initially suspected to have polypharmacy dehydration as cause of encephalopathy.  No focal deficits to suggest stroke, no hyperreflexia to suggest serotonin syndrome, no fever to suggest neuroleptic malignant syndrome.  UA yesterday and two-view chest x-ray rule out UTI or pneumonia.  Procalcitonin undetectable.  Treated with fluids, home medications continued, and she has had improvement overnight - PT/OT - Continue citalopram  at lower dose - Continue BuSpar  - Continue clonazepam  as needed - Continue Lamictal  at home dose - Continue Seroquel  and Risperdal  at home doses - Stop trazodone    Rhabdomyolysis Nontraumatic, resolving.  Creatinine stable - Trend CK   -Stop fluids  AKI Mild, resolved with fluids - Hold triamterene -HCTZ given normal blood pressure  Myocardial injury See summary from 12/7, echocardiogram unremarkable  Hyperlipidemia - Hold pravastatin   Mood disorder - Continue BuSpar , Lamictal , quetiapine , Risperdal  as above - Reduce Celexa  dose due to age - Clonazepam  as needed  COPD Chronic respiratory failure with hypoxia due to restrictive lung disease Breathing at baseline, no wheezing or rales.  Per outpatient pulmonology note, prescribed home oxygen , uses it consistently  Hypertension Blood pressure normal - Hold triamterene , HCTZ  History of breast cancer No longer on Arimidex   Hypokalemia Treated and  resolved      Subjective: Patient is doing much better today, good appetite, confusion is improved.  No fever, no cough      Physical Exam: BP 129/76 (BP Location: Right Arm)   Pulse 95   Temp 98.1 F (36.7 C) (Oral)   Resp 16   SpO2 95%   Thin adult female, scoliosis noted, lying in bed, no acute distress RRR, no murmurs, no peripheral edema Respiratory rate seems normal, lungs diminished bilaterally, no rales or wheezes appreciated Abdomen soft, no tenderness palpation, no ascites or distention Responsive, makes eye contact, oriented x 3, face symmetric, generalized weakness but symmetric strength    Data Reviewed: Basic metabolic panel normal CBC shows no leukocytosis or anemia Chest x-ray shows no new infiltrate Urinalysis normal   Family Communication: Husband at the bedside, daughter by phone   Disposition: Status is: Inpatient        Author: Lonni SHAUNNA Dalton, MD 12/27/2023 9:13 AM  For on call review [email or web address].

## 2023-12-27 NOTE — NC FL2 (Signed)
 [place]  MEDICAID FL2 LEVEL OF CARE FORM     IDENTIFICATION  Patient Name: Jennifer [name] Birthdate: 03-13-1945 Sex: female Admission Date (Current Location): 12/24/2023  [facility] [place] and [place] Number:  Producer, Television/film/video and Address:  [facility], [address] [place], [place] [address]      Provider Number: [id number]  Attending Physician Name and Address:  Jonel Lonni SQUIBB, *  Relative Name and Phone Number:  Geeta, Dworkin (Spouse)  [phone]    Current Level of Care: Hospital Recommended Level of Care: Skilled Nursing Facility Prior Approval Number:    Date Approved/Denied:   PASRR Number: Pending  Discharge Plan: SNF    Current Diagnoses: Patient Active Problem List   Diagnosis Date Noted   AKI (acute kidney injury) 12/24/2023   Dehydration 12/24/2023   Rhabdomyolysis 12/24/2023   Decreased GFR 06/27/2023   Pre-op evaluation 06/23/2023   MDD (major depressive disorder), recurrent, in full remission 05/17/2023   Bereavement 05/17/2023   Trigger finger, right ring finger 04/25/2023   High risk medication use 09/09/2022   Overuse of medication 06/10/2022   GAD (generalized anxiety disorder) 04/08/2022   Depression 04/08/2022   Anxiety 11/02/2021   Decreased appetite 10/07/2021   Osteopenia 08/22/2021   Tremor 06/07/2021   Hypokalemia 06/07/2021   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female, 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   Nasal congestion 02/04/2020   Neck pain 08/19/2019   B12 deficiency 08/19/2019   Knee pain 02/04/2019   Memory loss 02/04/2019   Osteoarthritis of left knee 01/18/2018   Carpal tunnel syndrome of right wrist 10/30/2017   Ulnar neuropathy of right upper extremity 10/21/2017   Radiculopathy of cervical spine 04/20/2017   Spondylosis of cervical spine 04/19/2017   Right arm pain 11/12/2016   Vertigo 09/02/2016   Left low back pain 05/22/2015   Left hip pain 05/22/2015    SOB (shortness of breath) 11/11/2014   Right shoulder pain 07/16/2014   Health care maintenance 06/03/2014   Adenomatous polyp 02/06/2014   Arm skin lesion, left 06/18/2013   Disorder of the skin and subcutaneous tissue, unspecified 06/18/2013   Hyperglycemia 11/12/2012   Hypercholesterolemia 11/12/2012   Other abnormal glucose 11/12/2012   Essential hypertension, benign 05/12/2012   GERD (gastroesophageal reflux disease) 05/12/2012   Gastritis 05/12/2012   History of colonic polyps 05/12/2012   Asthma 12/13/2011   Chronic restrictive lung disease 05/11/2011   Idiopathic scoliosis 01/28/2011   Degeneration of lumbosacral intervertebral disc 01/28/2011   Acquired spondylolisthesis 01/28/2011    Orientation RESPIRATION BLADDER Height & Weight     Place, Self  O2 Incontinent Weight:   Height:     BEHAVIORAL SYMPTOMS/MOOD NEUROLOGICAL BOWEL NUTRITION STATUS      Incontinent Diet (See dc summary)  AMBULATORY STATUS COMMUNICATION OF NEEDS Skin   Extensive Assist (Pt unable) Verbally Other (Comment) (Ecchymosis)                       Personal Care Assistance Level of Assistance  Bathing, Feeding, Dressing Bathing Assistance: Maximum assistance (Total assist) Feeding assistance: Limited assistance Dressing Assistance: Maximum assistance (Total assist)     Functional Limitations Info  Sight, Hearing, Speech Sight Info: Impaired Hearing Info: Impaired Speech Info: Adequate    SPECIAL CARE FACTORS FREQUENCY  PT (By licensed PT), OT (By licensed OT)     PT Frequency: 5x/wk OT Frequency: 5x/wk  Contractures Contractures Info: Not present    Additional Factors Info  Code Status, Allergies Code Status Info: FULL Allergies Info: Pollen Extract           Current Medications (12/27/2023):  This is the current hospital active medication list Current Facility-Administered Medications  Medication Dose Route Frequency Provider Last Rate Last Admin    albuterol  (PROVENTIL ) (2.5 MG/3ML) 0.083% nebulizer solution 2.5 mg  2.5 mg Nebulization Q4H PRN Uhlorn, Garett M, RPH       busPIRone  (BUSPAR ) tablet 10 mg  10 mg Oral TID Garba, Mohammad L, MD   10 mg at 12/27/23 0940   citalopram  (CELEXA ) tablet 20 mg  20 mg Oral Daily Danford, Christopher P, MD   20 mg at 12/27/23 0940   clonazePAM  (KLONOPIN ) tablet 0.5 mg  0.5 mg Oral BID PRN Jonel Lonni SQUIBB, MD   0.5 mg at 12/26/23 2101   enoxaparin  (LOVENOX ) injection 40 mg  40 mg Subcutaneous Q24H Garba, Mohammad L, MD   40 mg at 12/26/23 2100   feeding supplement (ENSURE PLUS HIGH PROTEIN) liquid 237 mL  237 mL Oral BID BM Danford, Lonni SQUIBB, MD       guaiFENesin  (MUCINEX ) 12 hr tablet 600 mg  600 mg Oral BID Rashid, Farhan, MD   600 mg at 12/27/23 0940   hydrALAZINE  (APRESOLINE ) injection 10 mg  10 mg Intravenous Q4H PRN Rashid, Farhan, MD       lamoTRIgine  (LAMICTAL ) tablet 25 mg  25 mg Oral QPC lunch Sim Re L, MD   25 mg at 12/26/23 1602   ondansetron  (ZOFRAN ) tablet 4 mg  4 mg Oral Q6H PRN Sim Re CROME, MD       Or   ondansetron  (ZOFRAN ) injection 4 mg  4 mg Intravenous Q6H PRN Sim Re CROME, MD       Oral care mouth rinse  15 mL Mouth Rinse PRN Danford, Lonni SQUIBB, MD       oxyCODONE  (Oxy IR/ROXICODONE ) immediate release tablet 5 mg  5 mg Oral Q4H PRN Garba, Mohammad L, MD       polyethylene glycol (MIRALAX  / GLYCOLAX ) packet 17 g  17 g Oral Daily Jonel Lonni SQUIBB, MD   17 g at 12/27/23 0940   potassium chloride  (KLOR-CON  M) CR tablet 10 mEq  10 mEq Oral Daily Garba, Mohammad L, MD   10 mEq at 12/27/23 0940   QUEtiapine  (SEROQUEL ) tablet 50 mg  50 mg Oral QHS Garba, Mohammad L, MD   50 mg at 12/26/23 2100   risperiDONE  (RISPERDAL ) tablet 0.5 mg  0.5 mg Oral Daily Sim Re CROME, MD   0.5 mg at 12/27/23 0940     Discharge Medications: Please see discharge summary for a list of discharge medications.  Relevant Imaging Results:  Relevant Lab  Results:   Additional Information SSN: [id number]  Lendia Dais, LCSWA

## 2023-12-27 NOTE — Plan of Care (Signed)
  Problem: Health Behavior/Discharge Planning: Goal: Ability to manage health-related needs will improve Outcome: Progressing   Problem: Nutritional: Goal: Ability to make healthy dietary choices will improve Outcome: Progressing   Problem: Clinical Measurements: Goal: Complications related to the disease process, condition or treatment will be avoided or minimized Outcome: Progressing

## 2023-12-27 NOTE — TOC Initial Note (Addendum)
 Transition of Care [facility]) - Initial/Assessment Note    Patient Details  Name: Jennifer [name] MRN: [id number] Date of Birth: October 12, 1945  Transition [facility]) CM/SW Contact:    Lendia Dais, LCSWA Phone Number: 12/27/2023, 10:28 AM  Clinical Narrative: Pt is from home w/ spouse Jennifer [name]. CSW was unable to assess due to pt being asleep and not responding to verbal prompts to wake up. RN at bedside stated that the pt is oriented x2 and that the husband was making decisions.  CSW spoke to [place] via phone and introduced self and role. CSW informed Jennifer [name] of PT recs of SNF and Jennifer [name] stated he would like to talk to his daughter first about a decision. CSW stated understanding and encouraged Jennifer [name] to be mindful that medically stability is approaching and we want to be prepared for discharge. Jennifer [name] stated understanding.  CSW sent out referrals in the HUB and pending bed offers and PASRR.   1237 - CSW spoke to pt's daughter Jennifer [name] at bedside and informed her that the pt's referral for STR has been sent out and offers have been given. CSW left Jennifer [name] the medicare.gov list of acceptances and stated that family would talk about what place would be best.   CSW will continue to follow.                   Expected Discharge Plan: Skilled Nursing Facility Barriers to Discharge: Continued Medical Work up, Family Issues, SNF Pending bed offer   Patient Goals and CMS Choice Patient states their goals for this hospitalization and ongoing recovery are:: unable to assess          Expected Discharge Plan and Services In-house Referral: Clinical Social Work     Living arrangements for the past 2 months: Single Family Home                                      Prior Living Arrangements/Services Living arrangements for the past 2 months: Single Family Home Lives with:: Spouse Patient language and need for interpreter reviewed:: Yes Do you feel safe going back to the place where you live?:   (unable to assess)      Need for Family Participation in Patient Care: Yes (Comment) Care giver support system in place?: Yes (comment) Current home services: DME Criminal Activity/Legal Involvement Pertinent to Current Situation/Hospitalization: No - Comment as needed  Activities of Daily Living      Permission Sought/Granted Permission sought to share information with : Family Supports Permission granted to share information with : No (unable to assess)  Share Information with NAME: Jennifer [name]     Permission granted to share info w Relationship: Spouse  Permission granted to share info w Contact Information: [phone]  Emotional Assessment Appearance:: Appears stated age Attitude/Demeanor/Rapport: Unable to Assess Affect (typically observed): Unable to Assess Orientation: : Oriented to Self, Oriented to Place Alcohol / Substance Use: Not Applicable Psych Involvement: No (comment)  Admission diagnosis:  Rhabdomyolysis [M62.82] Dehydration [E86.0] AKI (acute kidney injury) [N17.9] Non-traumatic rhabdomyolysis [M62.82] Patient Active Problem List   Diagnosis Date Noted   AKI (acute kidney injury) 12/24/2023   Dehydration 12/24/2023   Rhabdomyolysis 12/24/2023   Decreased GFR 06/27/2023   Pre-op evaluation 06/23/2023   MDD (major depressive disorder), recurrent, in full remission 05/17/2023   Bereavement 05/17/2023   Trigger finger, right ring finger 04/25/2023   High risk medication  use 09/09/2022   Overuse of medication 06/10/2022   GAD (generalized anxiety disorder) 04/08/2022   Depression 04/08/2022   Anxiety 11/02/2021   Decreased appetite 10/07/2021   Osteopenia 08/22/2021   Tremor 06/07/2021   Hypokalemia 06/07/2021   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female, 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   Nasal congestion 02/04/2020   Neck pain 08/19/2019   B12 deficiency 08/19/2019   Knee pain 02/04/2019   Memory loss  02/04/2019   Osteoarthritis of left knee 01/18/2018   Carpal tunnel syndrome of right wrist 10/30/2017   Ulnar neuropathy of right upper extremity 10/21/2017   Radiculopathy of cervical spine 04/20/2017   Spondylosis of cervical spine 04/19/2017   Right arm pain 11/12/2016   Vertigo 09/02/2016   Left low back pain 05/22/2015   Left hip pain 05/22/2015   SOB (shortness of breath) 11/11/2014   Right shoulder pain 07/16/2014   Health care maintenance 06/03/2014   Adenomatous polyp 02/06/2014   Arm skin lesion, left 06/18/2013   Disorder of the skin and subcutaneous tissue, unspecified 06/18/2013   Hyperglycemia 11/12/2012   Hypercholesterolemia 11/12/2012   Other abnormal glucose 11/12/2012   Essential hypertension, benign 05/12/2012   GERD (gastroesophageal reflux disease) 05/12/2012   Gastritis 05/12/2012   History of colonic polyps 05/12/2012   Asthma 12/13/2011   Chronic restrictive lung disease 05/11/2011   Idiopathic scoliosis 01/28/2011   Degeneration of lumbosacral intervertebral disc 01/28/2011   Acquired spondylolisthesis 01/28/2011   PCP:  Glendia Shad, MD Pharmacy:   [facility] - [place], [place] - [address] [place] [address] Phone: [phone] Fax: [phone]  [facility] [address] [place] [address] Phone: [phone] Fax: [phone]     Social Drivers of Health (SDOH) Social History: SDOH Screenings   Food Insecurity: No Food Insecurity (12/24/2023)  Housing: Unknown (12/24/2023)  Transportation Needs: No Transportation Needs (12/24/2023)  Utilities: Not At Risk (12/24/2023)  Alcohol Screen: Low Risk  (02/16/2023)  Depression (PHQ2-9): Low Risk  (08/16/2023)  Financial Resource Strain: Low Risk  (02/16/2023)  Physical Activity: Inactive (02/16/2023)  Social Connections: Unknown (12/24/2023)  Stress: No Stress Concern Present (02/16/2023)  Recent Concern: Stress - Stress Concern  Present (02/11/2023)  Tobacco Use: Low Risk  (12/24/2023)  Health Literacy: Adequate Health Literacy (02/16/2023)   SDOH Interventions:     Readmission Risk Interventions     No data to display

## 2023-12-27 NOTE — Care Management Important Message (Signed)
 Important Message  Patient Details  Name: Jennifer [name] MRN: [id number] Date of Birth: 06-12-1945   Important Message Given:  Yes - Medicare IM     Claretta Deed 12/27/2023, 11:55 AM

## 2023-12-27 NOTE — Plan of Care (Signed)
 Great difficulty with walking. Really can just pivot to [facility]. Barely moves feet.   Problem: Fluid Volume: Goal: Compliance with measures to maintain balanced fluid volume will improve Outcome: Progressing   Problem: Health Behavior/Discharge Planning: Goal: Ability to manage health-related needs will improve Outcome: Progressing   Problem: Nutritional: Goal: Ability to make healthy dietary choices will improve Outcome: Progressing   Problem: Clinical Measurements: Goal: Complications related to the disease process, condition or treatment will be avoided or minimized Outcome: Progressing

## 2023-12-27 NOTE — Care Management Important Message (Signed)
 Important Message  Patient Details  Name: Jennifer [name] MRN: [id number] Date of Birth: Sep 09, 1945   Important Message Given:  Yes - Medicare IM     Claretta Deed 12/27/2023, 4:30 PM

## 2023-12-28 ENCOUNTER — Encounter: Payer: Self-pay | Admitting: Internal Medicine

## 2023-12-28 LAB — CK: Total CK: 1446 U/L — ABNORMAL HIGH (ref 38–234)

## 2023-12-28 LAB — COMPREHENSIVE METABOLIC PANEL WITH GFR
ALT: 38 U/L (ref 0–44)
AST: 96 U/L — ABNORMAL HIGH (ref 15–41)
Albumin: 2.6 g/dL — ABNORMAL LOW (ref 3.5–5.0)
Alkaline Phosphatase: 40 U/L (ref 38–126)
Anion gap: 5 (ref 5–15)
BUN: 22 mg/dL (ref 8–23)
CO2: 32 mmol/L (ref 22–32)
Calcium: 8.1 mg/dL — ABNORMAL LOW (ref 8.9–10.3)
Chloride: 104 mmol/L (ref 98–111)
Creatinine, Ser: 0.83 mg/dL (ref 0.44–1.00)
GFR, Estimated: >60 mL/min (ref >60)
Glucose, Bld: 116 mg/dL — ABNORMAL HIGH (ref 70–99)
Potassium: 4.3 mmol/L (ref 3.5–5.1)
Sodium: 141 mmol/L (ref 135–145)
Total Bilirubin: 0.8 mg/dL (ref 0.0–1.2)
Total Protein: 5.1 g/dL — ABNORMAL LOW (ref 6.5–8.1)

## 2023-12-28 LAB — CBC
HCT: 35.8 % — ABNORMAL LOW (ref 36.0–46.0)
Hemoglobin: 11.2 g/dL — ABNORMAL LOW (ref 12.0–15.0)
MCH: 31.7 pg (ref 26.0–34.0)
MCHC: 31.3 g/dL (ref 30.0–36.0)
MCV: 101.4 fL — ABNORMAL HIGH (ref 80.0–100.0)
Platelets: 185 K/uL (ref 150–400)
RBC: 3.53 MIL/uL — ABNORMAL LOW (ref 3.87–5.11)
RDW: 13 % (ref 11.5–15.5)
WBC: 7.3 K/uL (ref 4.0–10.5)
nRBC: 0 % (ref 0.0–0.2)

## 2023-12-28 MED ORDER — GUAIFENESIN-DM 100-10 MG/5ML PO SYRP
10.0000 mL | ORAL_SOLUTION | Freq: Four times a day (QID) | ORAL | Status: DC | PRN
  Administered 2023-12-28 – 2023-12-29 (×3): 10 mL via ORAL
  Filled 2023-12-28 (×3): qty 10

## 2023-12-28 NOTE — TOC PASRR Note (Cosign Needed)
 [facility] IP TOC PASRR NOTE  PASRR has been approved. [id number] E valid from  12/28/2023 - 01/27/2024.

## 2023-12-28 NOTE — Telephone Encounter (Signed)
 Fyi pt is  in hospital

## 2023-12-28 NOTE — Plan of Care (Signed)
  Problem: Education: Goal: Knowledge of General Education information will improve Description: Including pain rating scale, medication(s)/side effects and non-pharmacologic comfort measures Outcome: Progressing   Problem: Health Behavior/Discharge Planning: Goal: Ability to manage health-related needs will improve Outcome: Progressing   Problem: Clinical Measurements: Goal: Ability to maintain clinical measurements within normal limits will improve Outcome: Progressing Goal: Will remain free from infection Outcome: Progressing Goal: Diagnostic test results will improve Outcome: Progressing Goal: Respiratory complications will improve Outcome: Progressing Goal: Cardiovascular complication will be avoided Outcome: Progressing   Problem: Activity: Goal: Risk for activity intolerance will decrease Outcome: Progressing   Out of bed all morning. Requires assistance for all transfers and more than husband can handle. Pt has great fear of falling.

## 2023-12-28 NOTE — Progress Notes (Signed)
  Progress Note   Patient: Jennifer [name] FMW:[id number] DOB: April 25, 1945 DOA: 12/24/2023     4 DOS: the patient was seen and examined on 12/28/2023        Brief hospital course: 78 y.o. F with MCI, lives in independent living with husband, COPD, HTN, and scoliosis who presented with generalized weakness, increased confusion, went to lay down on the floor by her bed and wouldn't get up (for unclear period of time) and in the ER, found to have rhabdomyolysis.     Assessment and Plan: Acute metabolic encephalopathy See summary from 12/8 PT/OT - Continue new dose of citalopram  - Continue BuSpar , Lamictal , Seroquel , Risperdal  at home doses - Continue clonazepam  as needed  Rhabdomyolysis Nontraumatic, resolving Creatinine stable  Acute kidney injury Resolved - Hold triamterene , HCTZ given normal blood pressure  Mood disorder - Continue buspirone , lamotrigine , quetiapine , risperidone  as above - Continue reduced dose of Celexa  due to age - Clonazepam  as needed  Hyperlipidemia - Hold pravastatin   COPD Chronic respiratory failure with hypoxia due to restrictive lung disease Chest x-ray x 2 without pneumonia, no leukocytosis procalcitonin negative.  Breathing seems to be at baseline, no wheezing or rales, per outpatient pulmonology note she uses prescribed home oxygen  inconsistently  Hypertension Blood pressure normal off medications - Hold triamterene , HCTZ  History of breast cancer No longer on Arimidex   Hypokalemia Treated and resolved         Subjective: Seems to be stable, confusion is improved, no new fever or respiratory distress, cough is stable     Physical Exam: BP 129/79 (BP Location: Right Arm)   Pulse 85   Temp 98.2 F (36.8 C) (Oral)   Resp 17   SpO2 97%   Thin adult female, sitting up in chair, tired, but responsive to questions RRR, no murmurs, no peripheral edema Respiratory rate seems normal, lung sounds with poor air movement but no rales or  wheezes appreciated Abdomen soft, no tenderness to palpation, no ascites or distention Response to questions, makes own contact, oriented to self, husband, hospital, but not year, face symmetric, generalized weakness but symmetric strength    Data Reviewed: Basic metabolic panel normal CBC shows mild anemia, no leukocytosis    Family Communication: [place] at the bedside   Disposition: Status is: Inpatient        Author: Lonni SHAUNNA Dalton, MD 12/28/2023 10:14 AM  For on call review [email or web address].

## 2023-12-28 NOTE — Progress Notes (Signed)
 Physical Therapy Treatment Patient Details Name: Jennifer [name] MRN: [id number] DOB: [birth year] Today's Date: 12/28/2023   History of Present Illness 78 y.o. female [facility] for AMS and ?multiple falls. Found to have some AKI, hypokalemia, and CPK of more than 7000 leading to suspected fall with rhabdomyolysis. PMH: COPD, GERD, depression, asthma, panic attacks, essential HTN, scoliosis, early dementia and memory changes.    PT Comments  Attempted pt at lunch, however up in recliner and eating meal. PT returned after RN reaching out stating that patient was ready to get back in bed. Upon PT arrival, patient supine in bed without alarms on. Patient's spouse, Georgette, reports that he helped her back to bed without assistance from staff. Gait belt buried in recliner and RW found in bathroom with all legs not in fixed position for stability. Education provided to patient and spouse about calling staff for mobility out of safety measures and preventing falls; RN informed of situation.  Patient agreeable to mobility, however tearful throughout session with repeated verbalization of fear of falls. Able to sit to EOB using bed features and decreased physical assistance compared to evaluation. Severe anxiety continues to warrant +2 for mobility due to patient's fear and comfort with more hands on assistance and guard. Stands with RW and steps to sink to complete oral care and brush hair at set-up to min assist with patient able to maintain standing position for 5 minutes. Continue to require multimodal cues and reassurance that PT would stay with her, keep hands on support and not let her fall. Following seated rest break in recliner, transferred back to bed to allow patient to rest secondary to fatigue and anxiety as it was not appropriate to continue with mobility and ambulation attempt. She continues to be limited by anxiety with fear of falling, impaired balance, cognitive deficits with unclear baseline,  decreased functional strength and endurance, and decreased functional independence below her baseline. She will continue to benefit from skilled PT while in acute hospital as well as skilled therapy intervention at discharge with <3hrs/day at SNF.   If plan is discharge home, recommend the following: Assistance with cooking/housework;Direct supervision/assist for medications management;Direct supervision/assist for financial management;Assist for transportation;Help with stairs or ramp for entrance;Supervision due to cognitive status;A lot of help with walking and/or transfers;A lot of help with bathing/dressing/bathroom   Can travel by private vehicle     No  Equipment Recommendations  Wheelchair (measurements PT);Wheelchair cushion (measurements PT);Hospital bed;BSC/3in1;Rolling walker (2 wheels)    Recommendations for Other Services       Precautions / Restrictions Precautions Precautions: Fall Recall of Precautions/Restrictions: Impaired Precaution/Restrictions Comments: frequent falls suspected by family, very anxious about falling, difficulty to orient and educate on safety precautions due to anxiety Restrictions Weight Bearing Restrictions Per Provider Order: No     Mobility  Bed Mobility Overal bed mobility: Needs Assistance Bed Mobility: Supine to Sit, Sit to Supine     Supine to sit: +2 for safety/equipment, Min assist Sit to supine: Min assist   General bed mobility comments: demonstrates increase in functional mobility status compared to evaluation, however continues to be very anxious with all mobility and benefits from second person present for comfort and support    Transfers Overall transfer level: Needs assistance Equipment used: Rolling walker (2 wheels) Transfers: Sit to/from Stand, Bed to chair/wheelchair/BSC Sit to Stand: +2 safety/equipment, Mod assist Stand pivot transfers: +2 safety/equipment, Mod assist         General transfer comment: MOD  A with  use of RW and continual multimodal cues for sit<>stands and bed>recliner>bed transfers due to severe anxiety. Second person present to provide hands on support and comfort to patient due to anxiety. Limited carryover of cues between transfers for proper hand placement    Ambulation/Gait Ambulation/Gait assistance:  (deferred today due to fatigue and anxiety; pt had been up in recliner and to [facility] several times today before PT session)                 Stairs             Wheelchair Mobility     Tilt Bed    Modified Rankin (Stroke Patients Only)       Balance Overall balance assessment: Needs assistance, History of Falls Sitting-balance support: Bilateral upper extremity supported, Feet supported Sitting balance-Leahy Scale: Fair Sitting balance - Comments: very anxious and tearful with fear of falling, feels safer with hands on support at all times Postural control: Posterior lean Standing balance support: Bilateral upper extremity supported, During functional activity Standing balance-Leahy Scale: Poor Standing balance comment: posterior lean with transfers and standing requiring multimodal cues to correct                            Communication Communication Communication: No apparent difficulties  Cognition Arousal: Alert Behavior During Therapy: Flat affect, Anxious   PT - Cognitive impairments: History of cognitive impairments, Memory, Awareness, Initiation, Sequencing, Problem solving, Safety/Judgement, Orientation   Orientation impairments: Place, Time, Situation                   PT - Cognition Comments: orientation to situation and place required throughout session due to increased anxiety and baseline cognitive deficits Following commands: Impaired Following commands impaired: Follows one step commands inconsistently, Follows one step commands with increased time    Cueing Cueing Techniques: Verbal cues, Tactile cues  Exercises       General Comments        Pertinent Vitals/Pain Pain Assessment Pain Assessment: No/denies pain    Home Living                          Prior Function            PT Goals (current goals can now be found in the care plan section) Acute Rehab PT Goals Patient Stated Goal: to improve mobility before returning home PT Goal Formulation: With patient Time For Goal Achievement: 01/09/24 Potential to Achieve Goals: Good Progress towards PT goals: Progressing toward goals    Frequency    Min 2X/week      PT Plan      Co-evaluation              AM-PAC PT 6 Clicks Mobility   Outcome Measure  Help needed turning from your back to your side while in a flat bed without using bedrails?: A Little Help needed moving from lying on your back to sitting on the side of a flat bed without using bedrails?: A Lot Help needed moving to and from a bed to a chair (including a wheelchair)?: A Lot Help needed standing up from a chair using your arms (e.g., wheelchair or bedside chair)?: A Lot Help needed to walk in hospital room?: Total Help needed climbing 3-5 steps with a railing? : Total 6 Click Score: 11    End of Session Equipment Utilized During Treatment: Gait  belt Activity Tolerance: Patient tolerated treatment well;Patient limited by fatigue;Other (comment) (limited by anxiety and fear of falling) Patient left: in bed;with call bell/phone within reach;with bed alarm set;with family/visitor present Nurse Communication: Mobility status;Other (comment) (RN notified of pt being back in bed at start of session after being helped by spouse without use of gait belt or RW or staff present) PT Visit Diagnosis: Unsteadiness on feet (R26.81);Other abnormalities of gait and mobility (R26.89);Repeated falls (R29.6);Muscle weakness (generalized) (M62.81)     Time: 8654-8581 PT Time Calculation (min) (ACUTE ONLY): 33 min  Charges:    $Therapeutic Activity: 23-37 mins                        Ava Tangney H. Betheny Suchecki, PT, DPT    [employer] 12/28/2023, 2:51 PM

## 2023-12-28 NOTE — TOC Progression Note (Addendum)
 Transition of Care [facility]) - Progression Note    Patient Details  Name: Jennifer [name] MRN: [id number] Date of Birth: Mar 09, 1945  Transition of Care [facility]) CM/SW Contact  Lendia Dais, [place] Phone Number: 12/28/2023, 11:08 AM  Clinical Narrative:  CSW spoke to pt and pt's spouse Jennifer [name] at bedside. CSW inquired about decision for SNF and pt stated that they have not spoke to their daughter Jennifer [name].  CSW called Jennifer [name] in the room and inquired about SNF choice. Jennifer [name] stated that she had not spoken to other family members yet, CSW stated that the pt is medically stable and ready to progress to the next level of care. Jennifer [name] stated understanding and CSW stated they will follow up later on in the afternoon.  1016 - Heather left a VM stated that they chose [facility]. CSW started auth and auth is approved from 12/28/2023-12/30/2023.   1520 -PASRR approved. MD notified. Pending response from [facility], [place] for when they can receive the pt.  CSW will continue to monitor.    Expected Discharge Plan: Skilled Nursing Facility Barriers to Discharge: Continued Medical Work up, Family Issues, SNF Pending bed offer               Expected Discharge Plan and Services In-house Referral: Clinical Social Work     Living arrangements for the past 2 months: Single Family Home                                       Social Drivers of Health (SDOH) Interventions SDOH Screenings   Food Insecurity: No Food Insecurity (12/24/2023)  Housing: Unknown (12/24/2023)  Transportation Needs: No Transportation Needs (12/24/2023)  Utilities: Not At Risk (12/24/2023)  Alcohol Screen: Low Risk  (02/16/2023)  Depression (PHQ2-9): Low Risk  (08/16/2023)  Financial Resource Strain: Low Risk  (02/16/2023)  Physical Activity: Inactive (02/16/2023)  Social Connections: Unknown (12/24/2023)  Stress: No Stress Concern Present (02/16/2023)  Recent Concern: Stress - Stress Concern Present (02/11/2023)  Tobacco Use: Low  Risk  (12/24/2023)  Health Literacy: Adequate Health Literacy (02/16/2023)    Readmission Risk Interventions     No data to display

## 2023-12-29 ENCOUNTER — Telehealth: Payer: Self-pay | Admitting: Psychiatry

## 2023-12-29 LAB — CULTURE, BLOOD (ROUTINE X 2)
Culture: NO GROWTH
Culture: NO GROWTH
Special Requests: ADEQUATE
Special Requests: ADEQUATE

## 2023-12-29 LAB — BASIC METABOLIC PANEL WITH GFR
Anion gap: 6 (ref 5–15)
BUN: 21 mg/dL (ref 8–23)
CO2: 35 mmol/L — ABNORMAL HIGH (ref 22–32)
Calcium: 8.7 mg/dL — ABNORMAL LOW (ref 8.9–10.3)
Chloride: 100 mmol/L (ref 98–111)
Creatinine, Ser: 0.72 mg/dL (ref 0.44–1.00)
GFR, Estimated: >60 mL/min (ref >60)
Glucose, Bld: 106 mg/dL — ABNORMAL HIGH (ref 70–99)
Potassium: 4.5 mmol/L (ref 3.5–5.1)
Sodium: 141 mmol/L (ref 135–145)

## 2023-12-29 LAB — CBC
HCT: 37.8 % (ref 36.0–46.0)
Hemoglobin: 12 g/dL (ref 12.0–15.0)
MCH: 32 pg (ref 26.0–34.0)
MCHC: 31.7 g/dL (ref 30.0–36.0)
MCV: 100.8 fL — ABNORMAL HIGH (ref 80.0–100.0)
Platelets: 214 K/uL (ref 150–400)
RBC: 3.75 MIL/uL — ABNORMAL LOW (ref 3.87–5.11)
RDW: 12.7 % (ref 11.5–15.5)
WBC: 6.4 K/uL (ref 4.0–10.5)
nRBC: 0 % (ref 0.0–0.2)

## 2023-12-29 LAB — CK: Total CK: 1964 U/L — ABNORMAL HIGH (ref 38–234)

## 2023-12-29 LAB — GLUCOSE, CAPILLARY: Glucose-Capillary: 109 mg/dL — ABNORMAL HIGH (ref 70–99)

## 2023-12-29 MED ORDER — GUAIFENESIN ER 600 MG PO TB12: 600.0000 mg | ORAL_TABLET | Freq: Two times a day (BID) | ORAL | Status: AC

## 2023-12-29 MED ORDER — ENSURE PLUS HIGH PROTEIN PO LIQD: 237.0000 mL | Freq: Two times a day (BID) | ORAL | Status: AC

## 2023-12-29 MED ORDER — CITALOPRAM HYDROBROMIDE 20 MG PO TABS: 20.0000 mg | ORAL_TABLET | Freq: Every day | ORAL | Status: AC

## 2023-12-29 MED ORDER — CLONAZEPAM 0.5 MG PO TABS: 0.5000 mg | ORAL_TABLET | Freq: Two times a day (BID) | ORAL | 0 refills | Status: AC | PRN

## 2023-12-29 NOTE — Telephone Encounter (Signed)
 Noted , please keep on wait list and call if any sooner .

## 2023-12-29 NOTE — Discharge Summary (Addendum)
 Physician Discharge Summary   Patient: SHELTON SOLER MRN: [id number] DOB: [birth year]  Admit date:     12/24/2023  Discharge date: 12/29/23  Discharge Physician: Lonni SHAUNNA Dalton   PCP: Glendia Shad, MD     Recommendations at discharge:  Follow up with PCP Dr. Glendia within 1 week of discharge from SNF for rhabdomyolysis, hypertension Obtain BMP and CK level in 1 week (Cr 0.7 at discharge, CK 1900) Recommend follow up with Neurology as soon as able Recommend follow up with Psychiatry Dr. 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 F with MCI, lives in independent living with husband, COPD, HTN, and scoliosis who presented with generalized weakness, increased confusion, went to lay down on the floor by her bed and wouldn't get up (for unclear period of time) and in the ER, found to have rhabdomyolysis.        Acute metabolic encephalopathy/delirium likely due to polypharmacy and dehydration Admitted initially suspected to have polypharmacy dehydration as cause of encephalopathy.  No focal deficits to suggest stroke, no hyperreflexia to suggest serotonin syndrome, no fever to suggest neuroleptic malignant syndrome.   UA yesterday and two-view chest x-ray rule out UTI or pneumonia.  Procalcitonin undetectable.  Treated with fluids, home medications resumed and her mentation returned to baseline.  - Continue new dose of citalopram  - Continue BuSpar , Lamictal , Seroquel , Risperdal  at home doses - Continue clonazepam  as needed -  Follow up with Psychiatry Dr. Eappen as soon as able   Parkinsonism Patient noted in the hospital to have bradykinesia, masked facies and pill-rolling tremor.  Family report she was referred to Neurology in 2023 for tremor, at that time suspected to be Arimidex , which was stopped.  Symptoms worsened and around a year ago, she was titrated off a previous psychiatric regimen and a new regimen was added slowly.  Unfortunately, since then, her tremor has stayed the same or worse and functional status (movement velocity) has progressively gotten much worse.  Unclear if this is from dual antipsychotics (including quetiapine  50 mg nightly and risperidone  0.5 daily) or from underlying Parkinson's disease.  - Recommend follow up with Psychiatry ASAP for titration of antipsychotics - Recommend follow up with Neurology ASAP for consideration of Sinemet trial and close monitoring    Nontraumatic rhabdomyolysis This was resolving, creatinine is remained stable. -Push fluids and repeat CK in 1 week   Acute kidney injury Cr 1.09 on admission, resolved to 0.72 by discharge   Mood disorder See above   Hyperlipidemia Okay to resume statin.   COPD Chronic respiratory failure with hypoxia due to restrictive lung disease Chest x-ray x 2 without pneumonia, no leukocytosis procalcitonin negative.  Breathing seems to be at baseline, no wheezing or rales, per outpatient pulmonology note she uses prescribed home oxygen  inconsistently   Hypertension BP normal off medicaitons.  Hold triamterene /hydrochlorothiazide  and follow up with PCP   History of breast cancer No longer on Arimidex    Hypokalemia Treated and resolved  The [place]  Controlled Substances Registry was reviewed for this patient prior to discharge.     Disposition: Skilled nursing facility Diet recommendation:  Regular diet  DISCHARGE MEDICATION: Allergies as of 12/29/2023       Reactions    Pollen Extract Cough        Medication List     PAUSE taking these medications    triamterene -hydrochlorothiazide  37.5-25 MG tablet Wait to take this until your doctor or other care provider tells you to start again. Commonly known as: MAXZIDE -25 Take 0.5 tablets by mouth daily.       STOP taking these medications    anastrozole  1 MG tablet Commonly known as: ARIMIDEX    traZODone  50 MG tablet Commonly known as: DESYREL        TAKE these medications    albuterol  108 (90 Base) MCG/ACT inhaler Commonly known as: VENTOLIN  HFA Inhale 2 puffs into the lungs every 6 (six) hours as needed for wheezing or shortness of breath.   busPIRone  10 MG tablet Commonly known as: BUSPAR  Take 1 tablet (10 mg total) by mouth 3 (three) times daily.   citalopram  20 MG tablet Commonly known as: CELEXA  Take 1 tablet (20 mg total) by mouth daily. Start taking on: December 30, 2023 What changed:  medication strength how much to take   clonazePAM  0.5 MG tablet Commonly known as: KLONOPIN  Take 1 tablet (0.5 mg total) by mouth 2 (two) times daily as needed (for anxiety). What changed:  when to take this reasons to take this additional instructions   feeding supplement Liqd Take 237 mLs by mouth 2 (two) times daily between meals.   guaiFENesin  600 MG 12 hr tablet Commonly known as: MUCINEX  Take 1 tablet (600 mg total) by mouth 2 (two) times daily.   lamoTRIgine  25 MG tablet Commonly known as: LAMICTAL  Take 1 tablet (25 mg total) by mouth daily after lunch.   potassium chloride  10 MEQ tablet Commonly known as: KLOR-CON  Take one tablet by mouth once daily.   pravastatin  20 MG tablet Commonly known as: PRAVACHOL  Take 1 tablet (20 mg total) by mouth daily.   QUEtiapine  50 MG tablet Commonly known as: SEROQUEL  Take 1 tablet (50 mg total) by mouth at bedtime.   risperiDONE  0.5 MG tablet Commonly known as: RISPERDAL  Take 1 tablet (0.5 mg total) by mouth daily.         Follow-up Information     Glendia Shad, MD Follow up.   Specialty: Internal Medicine Contact information: [address] [place] [address] [phone]         Coby Height, MD Follow up.   Specialty: Psychiatry Contact information: [address] [place] [place] [address] [phone]                   Discharge Exam: BP (!) 118/58 (BP Location: Right Arm)   Pulse 73   Temp 98 F (36.7 C) (Oral)   Resp 18   SpO2 100%    General: Pt is alert, awake, not in acute distress, sitting up in bed, no acute distress Cardiovascular: RRR, nl S1-S2, no murmurs appreciated.   No LE edema.   Respiratory: Normal respiratory effort, limited air movement due to scoliosis, no rales or wheezes appreciated Abdominal: Abdomen soft and non-tender.  No distension or HSM.   Neuro/Psych: Strength symmetric in upper and lower extremities, generally weak.  Judgment and insight appear impaired but at baseline.   Condition at discharge: fair  The results  of significant diagnostics from this hospitalization (including imaging, microbiology, ancillary and laboratory) are listed below for reference.   Imaging Studies: ECHOCARDIOGRAM COMPLETE Result Date: 12/26/2023    ECHOCARDIOGRAM REPORT   Patient Name:   ANGELES ZEHNER Date of Exam: 12/26/2023 Medical Rec #:  [id number]     Height:       59.0 in Accession #:    [id number]    Weight:       149.6 lb Date of Birth:  12-Dec-1945      BSA:          1.630 m Patient Age:    78 years      BP:           151/91 mmHg Patient Gender: F             HR:           82 bpm. Exam Location:  Inpatient Procedure: 2D Echo, Cardiac Doppler and Color Doppler (Both Spectral and Color            Flow Doppler were utilized during procedure). Indications:    Elevated Troponin  History:        Patient has prior history of Echocardiogram examinations, most                 recent 05/31/2023. Signs/Symptoms:Shortness of Breath; Risk                  Factors:Hypertension.  Sonographer:    Sherlean Dubin Referring Phys: [id number] [facility] RASHID  Sonographer Comments: Technically difficult study due to poor echo windows. Image acquisition challenging due to patient body habitus and Image acquisition challenging due to respiratory motion. IMPRESSIONS  1. Overall poor image quality.  2. Left ventricular ejection fraction, by estimation, is 60 to 65%. The left ventricle has normal function. The left ventricle has no regional wall motion abnormalities. Left ventricular diastolic parameters are consistent with Grade I diastolic dysfunction (impaired relaxation).  3. Right ventricular systolic function is normal. The right ventricular size is normal.  4. The mitral valve is abnormal. No evidence of mitral valve regurgitation. No evidence of mitral stenosis.  5. The aortic valve is tricuspid. There is mild calcification of the aortic valve. There is mild thickening of the aortic valve. Aortic valve regurgitation is not visualized. Aortic valve sclerosis is present, with no evidence of aortic valve stenosis.  6. The inferior vena cava is normal in size with greater than 50% respiratory variability, suggesting right atrial pressure of 3 mmHg. FINDINGS  Left Ventricle: Left ventricular ejection fraction, by estimation, is 60 to 65%. The left ventricle has normal function. The left ventricle has no regional wall motion abnormalities. Strain was performed and the global longitudinal strain is indeterminate. The left ventricular internal cavity size was normal in size. There is no left ventricular hypertrophy. Left ventricular diastolic parameters are consistent with Grade I diastolic dysfunction (impaired relaxation). Right Ventricle: The right ventricular size is normal. No increase in right ventricular wall thickness. Right ventricular systolic function is normal. Left Atrium: Left atrial size was normal in size. Right Atrium: Right atrial size was normal in size.  Pericardium: There is no evidence of pericardial effusion. Mitral Valve: The mitral valve is abnormal. There is mild thickening of the mitral valve leaflet(s). There is mild calcification of the mitral valve leaflet(s). No evidence of mitral valve regurgitation. No evidence of mitral valve stenosis. Tricuspid Valve: The tricuspid valve is normal in structure. Tricuspid valve regurgitation is not  demonstrated. No evidence of tricuspid stenosis. Aortic Valve: The aortic valve is tricuspid. There is mild calcification of the aortic valve. There is mild thickening of the aortic valve. Aortic valve regurgitation is not visualized. Aortic valve sclerosis is present, with no evidence of aortic valve stenosis. Aortic valve peak gradient measures 3.8 mmHg. Pulmonic Valve: The pulmonic valve was normal in structure. Pulmonic valve regurgitation is mild. No evidence of pulmonic stenosis. Aorta: The aortic root is normal in size and structure. Venous: The inferior vena cava is normal in size with greater than 50% respiratory variability, suggesting right atrial pressure of 3 mmHg. IAS/Shunts: No atrial level shunt detected by color flow Doppler. Additional Comments: Overall poor image quality. 3D was performed not requiring image post processing on an independent workstation and was indeterminate.  LEFT VENTRICLE PLAX 2D LVIDd:         4.60 cm   Diastology LVIDs:         3.20 cm   LV e' medial:    3.92 cm/s LV PW:         0.90 cm   LV E/e' medial:  13.3 LV IVS:        0.80 cm   LV e' lateral:   10.70 cm/s LVOT diam:     1.90 cm   LV E/e' lateral: 4.9 LV SV:         34 LV SV Index:   21 LVOT Area:     2.84 cm  RIGHT VENTRICLE RV S prime:     10.20 cm/s  PULMONARY VEINS TAPSE (M-mode): 1.4 cm      Diastolic Velocity: 31.00 cm/s                             S/D Velocity:       1.60                             Systolic Velocity:  49.30 cm/s LEFT ATRIUM             Index        RIGHT ATRIUM           Index LA diam:        3.40 cm  2.09 cm/m   RA Area:     11.40 cm LA Vol (A2C):   26.7 ml 16.38 ml/m  RA Volume:   25.30 ml  15.52 ml/m LA Vol (A4C):   35.7 ml 21.90 ml/m LA Biplane Vol: 31.7 ml 19.44 ml/m  AORTIC VALVE AV Area (Vmax): 1.93 cm AV Vmax:        97.50 cm/s AV Peak Grad:   3.8 mmHg LVOT Vmax:      66.50 cm/s LVOT Vmean:     44.600 cm/s LVOT VTI:       0.120 m  AORTA Ao Root diam: 2.70 cm Ao Asc diam:  2.50 cm MITRAL VALVE MV Area (PHT): 3.91 cm    SHUNTS MV Decel Time: 194 msec    Systemic VTI:  0.12 m MV E velocity: 52.00 cm/s  Systemic Diam: 1.90 cm MV A velocity: 80.90 cm/s MV E/A ratio:  0.64 Maude Emmer MD Electronically signed by Maude Emmer MD Signature Date/Time: 12/26/2023/3:59:51 PM    Final    DG Chest 2 View Result Date: 12/26/2023 EXAM: 2 VIEW(S) XRAY OF THE CHEST 12/26/2023 11:11:59 AM COMPARISON: 12/24/2023 CLINICAL HISTORY: Cough FINDINGS: LUNGS  AND PLEURA: Chronic indistinctness of the left lung base, unchanged, likely attributable to left posterior hemidiaphragmatic eventration. No pleural effusion. No pneumothorax. HEART AND MEDIASTINUM: Hiatal hernia is present. No acute abnormality of the cardiac silhouette. BONES AND SOFT TISSUES: Right chest wall surgical clips noted. Partially visualized cervical spinal fusion hardware in place. Severe thoracic dextroconvex scoliosis with associated thoracic deformity. IMPRESSION: 1. No acute cardiopulmonary findings. 2. Chronic indistinctness of the left lung base, unchanged, likely due to left posterior hemidiaphragmatic eventration. 3. Severe thoracic dextroconvex scoliosis with associated thoracic deformity. 4. Several chronic and incidental postoperative and anatomic findings, see report body for details. Electronically signed by: Ryan Salvage MD 12/26/2023 03:04 PM EST RP Workstation: [id number]   DG Chest Port 1 View Result Date: 12/24/2023 EXAM: 1 VIEW(S) XRAY OF THE CHEST 12/24/2023 04:44:00 PM COMPARISON: None available. CLINICAL HISTORY:  Questionable sepsis - evaluate for abnormality FINDINGS: LUNGS AND PLEURA: Notable eventration of the left posterior hemidiaphragm with stable volume loss/scarring at the left lung base involving the lower lobe and lingula. Increased/new mostly band-like opacity peripherally at the right lung base favoring atelectasis or pneumonia. No pleural effusion. No pneumothorax. HEART AND MEDIASTINUM: Compensating for left basilar opacity in the degree of distortion due to the scoliosis, heart size is felt to be within normal limits. No acute abnormality of the mediastinal silhouette. BONES AND SOFT TISSUES: Severe dextroconvex thoracic and levoconvex lumbar scoliosis with rotator component and resulting thoracic deformities. Lower cervical plate and screw fixator. Small right axillary clips noted. IMPRESSION: 1. Increased or new mostly band-like opacity peripherally at the right lung base, favoring atelectasis over pneumonia. 2. Stable volume loss and scarring at the left lung base involving the lower lobe and lingula. 3. Severe dextroconvex thoracic and levoconvex lumbar scoliosis with rotator component and resulting thoracic deformities. 4. Eventration of the left posterior hemidiaphragm. Electronically signed by: Ryan Salvage MD 12/24/2023 05:12 PM EST RP Workstation: [id number]   CT Cervical Spine Wo Contrast Result Date: 12/24/2023 EXAM: CT CERVICAL SPINE WITHOUT CONTRAST 12/24/2023 04:08:12 PM TECHNIQUE: CT of the cervical spine was performed without the administration of intravenous contrast. Multiplanar reformatted images are provided for review. Automated exposure control, iterative reconstruction, and/or weight based adjustment of the mA/kV was utilized to reduce the radiation dose to as low as reasonably achievable. COMPARISON: CT cervical spine 11/29/2018. CLINICAL HISTORY: Multiple falls. FINDINGS: CERVICAL SPINE: BONES AND ALIGNMENT: Moderate to severe cervicothoracic levoscoliosis. No traumatic  subluxation. No acute fracture or suspicious lesion. C3-C6 ACDF with solid arthrodesis as well as facet ankylosis. Right side facet ankylosis at C6-C7 as well. Left laminectomy at C6-C7. Right sided foraminotomy at C4-C5. DEGENERATIVE CHANGES: Severe right facet arthrosis at C2-C3 and C7-T1 with associated neural foraminal stenosis. SOFT TISSUES: No prevertebral soft tissue swelling. IMPRESSION: 1. No acute cervical spine fracture or traumatic malalignment. 2. Extensive postoperative and degenerative changes as above. Electronically signed by: Dasie Hamburg MD 12/24/2023 04:30 PM EST RP Workstation: [id number]   CT Head Wo Contrast Result Date: 12/24/2023 EXAM: CT HEAD WITHOUT CONTRAST 12/24/2023 04:08:12 PM TECHNIQUE: CT of the head was performed without the administration of intravenous contrast. Automated exposure control, iterative reconstruction, and/or weight based adjustment of the mA/kV was utilized to reduce the radiation dose to as low as reasonably achievable. COMPARISON: Head CT 06/07/2022. CLINICAL HISTORY: Multiple falls with AMS. FINDINGS: BRAIN AND VENTRICLES: There is no evidence of an acute infarct, intracranial hemorrhage, mass, midline shift, hydrocephalus, or extra-axial fluid collection. There is mild cerebral atrophy. Cerebral white matter hypodensities  are similar to the prior study and nonspecific but compatible with mild chronic small vessel ischemic disease. Calcified atherosclerosis at the skull base. ORBITS: No acute abnormality. SINUSES: No acute abnormality. SOFT TISSUES AND SKULL: No acute soft tissue abnormality. No skull fracture. IMPRESSION: 1. No acute intracranial abnormality. 2. Mild chronic small vessel ischemic disease. Electronically signed by: Dasie Hamburg MD 12/24/2023 04:24 PM EST RP Workstation: [id number]    Microbiology: Results for orders placed or performed during the hospital encounter of 12/24/23  Blood Culture (routine x 2)     Status: None   Collection Time:  12/24/23  3:40 PM   Specimen: BLOOD  Result Value Ref Range Status   Specimen Description BLOOD RIGHT ANTECUBITAL  Final   Special Requests   Final    BOTTLES DRAWN AEROBIC AND ANAEROBIC Blood Culture adequate volume   Culture   Final    NO GROWTH 5 DAYS Performed at [facility], [address], [place], [place] [address]    Report Status 12/29/2023 FINAL  Final  Blood Culture (routine x 2)     Status: None   Collection Time: 12/24/23  6:17 PM   Specimen: BLOOD  Result Value Ref Range Status   Specimen Description BLOOD SITE NOT SPECIFIED  Final   Special Requests   Final    BOTTLES DRAWN AEROBIC AND ANAEROBIC Blood Culture adequate volume   Culture   Final    NO GROWTH 5 DAYS Performed at [facility], [address] [place], [place] [address]    Report Status 12/29/2023 FINAL  Final    Labs: CBC: Recent Labs  Lab 12/24/23 2148 12/25/23 0614 12/27/23 0327 12/28/23 0104 12/29/23 0521  WBC 10.0 8.0 10.9* 7.3 6.4  HGB 11.9* 11.6* 12.2 11.2* 12.0  HCT 36.1 35.4* 38.2 35.8* 37.8  MCV 97.0 96.2 99.7 101.4* 100.8*  PLT 163 163 148* 185 214   Basic Metabolic Panel: Recent Labs  Lab 12/24/23 1540 12/24/23 1614 12/24/23 2148 12/25/23 0525 12/27/23 0327 12/28/23 0104 12/29/23 0521  NA 141 139  --  141 143 141 141  K 3.3* 3.1*  --  3.8 4.5 4.3 4.5  CL 99  --   --  104 106 104 100  CO2 29  --   --  26 24 32 35*  GLUCOSE 108*  --   --  124* 109* 116* 106*  BUN 19  --   --  13 13 22 21   CREATININE 1.05*  --  0.88 0.96 0.69 0.83 0.72  CALCIUM 8.8*  --   --  8.4* 8.1* 8.1* 8.7*   Liver Function Tests: Recent Labs  Lab 12/24/23 1540 12/25/23 0525 12/28/23 0104  AST 163* 211* 96*  ALT 35 41 38  ALKPHOS 40 34* 40  BILITOT 1.0 0.9 0.8  PROT 6.5 5.4* 5.1*  ALBUMIN 3.8 3.2* 2.6*   CBG: Recent Labs  Lab 12/24/23 1717 12/29/23 0744  GLUCAP 96 109*    Discharge time spent: approximately 45 minutes spent on discharge counseling, evaluation of  patient on day of discharge, and coordination of discharge planning with nursing, social work, pharmacy and case management  Signed: Lonni SHAUNNA Dalton, MD [facility] 12/29/2023

## 2023-12-29 NOTE — Telephone Encounter (Signed)
 If possible try to give a new patient slot if any available

## 2023-12-29 NOTE — Progress Notes (Signed)
 Occupational Therapy Treatment Patient Details Name: Jennifer [name] MRN: [id number] DOB: May 15, 1945 Today's Date: 12/29/2023   History of present illness 78 y.o. female presents to [facility] 12/5 for AMS and ?multiple falls. Found to have some AKI, hypokalemia, and CPK of more than 7000 leading to suspected fall with rhabdomyolysis. PMH: COPD, GERD, depression, asthma, panic attacks, essential HTN, scoliosis, early dementia and memory changes.   OT comments  Pt progressing well towards OT established goals. Focus of session on progressing functional mobility and increasing engagement in ADL tasks. Pt required up to Mod A +2 for functional transfers this session. Continues to require up to total A for BLE ADL tasks. OT to continue to follow Pt acutely to facilitate progress towards goals. Continue per POC.       If plan is discharge home, recommend the following:  Two people to help with walking and/or transfers;Two people to help with bathing/dressing/bathroom;Assistance with cooking/housework;Direct supervision/assist for medications management;Direct supervision/assist for financial management;Assist for transportation;Help with stairs or ramp for entrance;Supervision due to cognitive status   Equipment Recommendations  Other (comment) (defer next venue)    Recommendations for Other Services      Precautions / Restrictions Precautions Precautions: Fall Recall of Precautions/Restrictions: Impaired Precaution/Restrictions Comments: frequent falls suspected by family, very anxious about falling, difficulty to orient and educate on safety precautions due to anxiety Restrictions Weight Bearing Restrictions Per Provider Order: No       Mobility Bed Mobility Overal bed mobility: Needs Assistance Bed Mobility: Supine to Sit     Supine to sit: Mod assist, +2 for physical assistance, +2 for safety/equipment, HOB elevated     General bed mobility comments: Mod A +2 for bed mobility. Pt able to  move BLE towards EOB, requires increased assistance to manage trunk and come to EOB. Use of bed pads required.    Transfers Overall transfer level: Needs assistance Equipment used: Rolling walker (2 wheels) Transfers: Sit to/from Stand, Bed to chair/wheelchair/BSC Sit to Stand: Min assist, +2 safety/equipment Stand pivot transfers: Mod assist, +2 safety/equipment         General transfer comment: Min A to rise from bed. Explicit tactile cues for hand placement on RW. Pt instinctively holding onto OT forearm with initiation of movement. With [facility], able to place BUE onto RW. Pt step pivot to recliner on R side with Mod A for management of RW. Second person present to provide hands on support and comfort to patient due to anxiety.     Balance Overall balance assessment: Needs assistance Sitting-balance support: Bilateral upper extremity supported, Feet supported Sitting balance-Leahy Scale: Fair Sitting balance - Comments: Anxious in sitting, close contact for comfort Postural control: Posterior lean Standing balance support: Bilateral upper extremity supported, During functional activity, Reliant on assistive device for balance Standing balance-Leahy Scale: Poor Standing balance comment: Dependent on RW and external support                           ADL either performed or assessed with clinical judgement   ADL Overall ADL's : Needs assistance/impaired                 Upper Body Dressing : Moderate assistance;Sitting   Lower Body Dressing: Total assistance;Bed level                      Extremity/Trunk Assessment Upper Extremity Assessment Upper Extremity Assessment: Generalized weakness  Vision       Perception     Praxis     Communication Communication Communication: No apparent difficulties   Cognition Arousal: Alert Behavior During Therapy: Anxious Cognition: History of cognitive impairments             OT -  Cognition Comments: Baseline dementia                 Following commands: Impaired Following commands impaired: Follows one step commands inconsistently, Follows one step commands with increased time      Cueing   Cueing Techniques: Verbal cues, Tactile cues  Exercises      Shoulder Instructions       General Comments VSS on RA.    Pertinent Vitals/ Pain       Pain Assessment Pain Assessment: No/denies pain  Home Living                                          Prior Functioning/Environment              Frequency  Min 2X/week        Progress Toward Goals  OT Goals(current goals can now be found in the care plan section)  Progress towards OT goals: Progressing toward goals  Acute Rehab OT Goals Patient Stated Goal: To decrease pain OT Goal Formulation: With patient/family Time For Goal Achievement: 01/09/24 Potential to Achieve Goals: Fair ADL Goals Pt Will Perform Eating: with supervision;sitting Pt Will Perform Grooming: with supervision;sitting Pt Will Perform Upper Body Dressing: with min assist;sitting Pt Will Perform Lower Body Dressing: with mod assist;sitting/lateral leans Pt Will Transfer to Toilet: with mod assist;with +2 assist;bedside commode Additional ADL Goal #1: Pt will engage in ADL task while seated EOB for 5 minutes with no more than Min A.  Plan      Co-evaluation                 AM-PAC OT 6 Clicks Daily Activity     Outcome Measure   Help from another person eating meals?: A Little Help from another person taking care of personal grooming?: A Lot Help from another person toileting, which includes using toliet, bedpan, or urinal?: Total Help from another person bathing (including washing, rinsing, drying)?: Total Help from another person to put on and taking off regular upper body clothing?: A Lot Help from another person to put on and taking off regular lower body clothing?: Total 6 Click Score:  10    End of Session Equipment Utilized During Treatment: Gait belt;Rolling walker (2 wheels)  OT Visit Diagnosis: Muscle weakness (generalized) (M62.81);History of falling (Z91.81);Unsteadiness on feet (R26.81);Other symptoms and signs involving cognitive function   Activity Tolerance Patient tolerated treatment well   Patient Left in chair;with call bell/phone within reach;with family/visitor present   Nurse Communication          Time: 8871-8854 OT Time Calculation (min): 17 min  Charges: OT General Charges $OT Visit: 1 Visit OT Treatments $Therapeutic Activity: 8-22 mins  Maurilio CROME, OTR/L.  [facility] Acute Rehabilitation  Office: [phone]   Maurilio PARAS Mazell Aylesworth 12/29/2023, 12:20 PM

## 2023-12-29 NOTE — Progress Notes (Signed)
 Gave report to [employer] nursing staff at [facility] and answered all the questions, verbalized understanding.

## 2023-12-29 NOTE — Telephone Encounter (Signed)
 Received a message from Dr. Jonel, [facility] hospitalist.  Patient was recently admitted to medical service and may need tapering off of some of her psychotropics due to adverse side effects.  Will have staff contact patient to schedule at the nearest available appointment.

## 2023-12-29 NOTE — TOC Transition Note (Signed)
 Transition of Care [facility]) - Discharge Note   Patient Details  Name: Jennifer [name] MRN: [id number] Date of Birth: [birth year]  [facility] CM/SW Contact:  Lendia Dais, LCSWA Phone Number: 12/29/2023, 12:22 PM   Clinical Narrative:  Pt discharging to [facility]. RN report to [phone]. PTAR called at 12:15 and scheduled time for 3PM per daughter's request.  CSW spoke to pt's daughter Powell via phone who requested that transportation be schedule at [facility]. Medical transportation form in DC packet.  No further TOC needs.   Final next level of care: Skilled Nursing Facility Barriers to Discharge: Barriers Resolved   Patient Goals and CMS Choice Patient states their goals for this hospitalization and ongoing recovery are:: unable to assess          Discharge Placement              Patient chooses bed at: [facility] Patient to be transferred to facility by: PTAR Name of family member notified: Heather Patient and family notified of of transfer: 12/29/23  Discharge Plan and Services Additional resources added to the After Visit Summary for   In-house Referral: Clinical Social Work                                   Social Drivers of Health (SDOH) Interventions SDOH Screenings   Food Insecurity: No Food Insecurity (12/24/2023)  Housing: Low Risk  (12/28/2023)  Transportation Needs: No Transportation Needs (12/24/2023)  Utilities: Not At Risk (12/24/2023)  Alcohol Screen: Low Risk  (02/16/2023)  Depression (PHQ2-9): Low Risk  (08/16/2023)  Financial Resource Strain: Low Risk  (02/16/2023)  Physical Activity: Inactive (02/16/2023)  Social Connections: Unknown (12/28/2023)  Stress: No Stress Concern Present (02/16/2023)  Recent Concern: Stress - Stress Concern Present (02/11/2023)  Tobacco Use: Low Risk  (12/24/2023)  Health Literacy: Adequate Health Literacy (02/16/2023)     Readmission Risk Interventions     No data to display

## 2024-01-03 NOTE — Telephone Encounter (Signed)
 If she is in rehab, the call is usually the team that is taking care of her in rehab, to help determine treatment plan while she is in rehab. An appt with me is usually made after discharge from rehab.

## 2024-01-04 ENCOUNTER — Ambulatory Visit: Admitting: Internal Medicine

## 2024-01-31 ENCOUNTER — Telehealth: Payer: Self-pay

## 2024-01-31 NOTE — Telephone Encounter (Unsigned)
 Copied from CRM #[id number]. Topic: General - Call Back - No Documentation >> Jan 31, 2024 11:40 AM Rea BROCKS wrote: Reason for CRM: [facility] received referral from Dr. Glendia for physical therapy and occupational therapy but they need the face to face notes for when patient did see Dr. Glendia.   Delon PARAS.  Fax: [phone]  Phone: [phone]

## 2024-01-31 NOTE — Telephone Encounter (Signed)
 Copied from CRM #[id number]. Topic: Clinical - Home Health Verbal Orders >> Jan 31, 2024 10:04 AM Delon DASEN wrote: Caller/Agency: [facility] Callback Number: [phone] [employer]secure Service Requested: Physical Therapy and Occupational Therapy to eval and treat Frequency: tbd Any new concerns about the patient? No

## 2024-01-31 NOTE — Telephone Encounter (Signed)
 She has missed her last few appts (or canceled). Need to confirm if she is being followed by someone else - given she has moved and in a different living facility. If she has changed, will need to send orders to new PCP or MD covering. If I am still following, will need f/u appt scheduled and ok to give verbal if plans to keep f/u appt.

## 2024-02-02 ENCOUNTER — Telehealth: Payer: Self-pay

## 2024-02-02 NOTE — Telephone Encounter (Signed)
 Tried 5 times to call number listed currently saying number is unavailable

## 2024-02-02 NOTE — Telephone Encounter (Signed)
 Copied from CRM [id number]. Topic: Clinical - Home Health Verbal Orders >> Feb 02, 2024 11:57 AM Viola FALCON wrote: Caller/Agency: Olam from [facility]  Callback Number: [phone] Service Requested: Physical Therapy Frequency: 1x A WEEK FOR 9 WEEKS  Any new concerns about the patient? Yes, patient very tearful - pending psych evaluation

## 2024-02-02 NOTE — Telephone Encounter (Signed)
 Spoke with [employer]. Informed he that pt was currently ina home. Advised her we aren't able to sign order do to pt not being seen recently. Delon stated she would reach out to Jodi and the other coordinators to see if they can get more information on where patient is currently located and have the referral signed by the covering provider at the facility. Delon also stated she would have them get back with us  to update us  on where pt is currently living. Delon stated that pt was no longer at [facility] in [place]

## 2024-02-02 NOTE — Telephone Encounter (Signed)
 Lvm for Delon to give the office a call back.

## 2024-02-11 NOTE — Telephone Encounter (Unsigned)
 Copied from CRM [id number]. Topic: Clinical - Home Health Verbal Orders >> Feb 02, 2024 11:57 AM Viola FALCON wrote: Caller/Agency: Olam from [facility]  Callback Number: [phone] Service Requested: Physical Therapy Frequency: 1x A WEEK FOR 9 WEEKS  Any new concerns about the patient? Yes, patient very tearful - pending psych evaluation >> Feb 11, 2024 12:54 PM Rea ORN wrote: Olam calling to check status of this request. Olam verified that her phone number is [phone]. After speak with CAL, I advised Olam that a document needs to be faxed to clinic for home health orders. I provided the fax number and she stated she will get this request faxed.

## 2024-02-11 NOTE — Telephone Encounter (Signed)
 Lvm for Jennifer [name] to give office a call back. Calling to notify Jennifer [name] pt hasn't been seen since hospital/rehab intake.

## 2024-02-22 ENCOUNTER — Ambulatory Visit: Payer: Medicare Other

## 2024-02-23 ENCOUNTER — Telehealth: Payer: Self-pay | Admitting: Psychiatry

## 2024-02-23 NOTE — Telephone Encounter (Signed)
 Patient spouse called to cancel 02-24-24 appointment, stating she is at [facility]. She is being well taken care of and doing just fine. No follow up at this time.

## 2024-02-24 ENCOUNTER — Ambulatory Visit: Admitting: Psychiatry

## 2024-05-12 ENCOUNTER — Ambulatory Visit: Payer: Self-pay | Admitting: Neurology
